# Patient Record
Sex: Female | Born: 1955 | Race: White | Hispanic: No | Smoking: Former smoker
Health system: Southern US, Community
[De-identification: ages and names within clinical notes are randomized; demographics above are authoritative.]

## PROBLEM LIST (undated history)

## (undated) DIAGNOSIS — F329 Major depressive disorder, single episode, unspecified: Secondary | ICD-10-CM

## (undated) DIAGNOSIS — J449 Chronic obstructive pulmonary disease, unspecified: Secondary | ICD-10-CM

## (undated) DIAGNOSIS — F32A Depression, unspecified: Secondary | ICD-10-CM

## (undated) DIAGNOSIS — D649 Anemia, unspecified: Secondary | ICD-10-CM

## (undated) DIAGNOSIS — I739 Peripheral vascular disease, unspecified: Secondary | ICD-10-CM

## (undated) DIAGNOSIS — E785 Hyperlipidemia, unspecified: Secondary | ICD-10-CM

## (undated) DIAGNOSIS — I214 Non-ST elevation (NSTEMI) myocardial infarction: Secondary | ICD-10-CM

## (undated) DIAGNOSIS — I10 Essential (primary) hypertension: Secondary | ICD-10-CM

## (undated) DIAGNOSIS — E669 Obesity, unspecified: Secondary | ICD-10-CM

## (undated) DIAGNOSIS — I2510 Atherosclerotic heart disease of native coronary artery without angina pectoris: Secondary | ICD-10-CM

## (undated) DIAGNOSIS — M1990 Unspecified osteoarthritis, unspecified site: Secondary | ICD-10-CM

## (undated) DIAGNOSIS — L97309 Non-pressure chronic ulcer of unspecified ankle with unspecified severity: Secondary | ICD-10-CM

## (undated) HISTORY — PX: CHOLECYSTECTOMY: SHX55

## (undated) HISTORY — DX: Obesity, unspecified: E66.9

## (undated) HISTORY — DX: Essential (primary) hypertension: I10

## (undated) HISTORY — DX: Hyperlipidemia, unspecified: E78.5

## (undated) HISTORY — PX: BYPASS GRAFT POPLITEAL TO TIBIAL: SHX5764

## (undated) HISTORY — PX: CORONARY ANGIOPLASTY WITH STENT PLACEMENT: SHX49

## (undated) HISTORY — DX: Peripheral vascular disease, unspecified: I73.9

## (undated) HISTORY — PX: FRACTURE SURGERY: SHX138

## (undated) HISTORY — PX: ANKLE SURGERY: SHX546

## (undated) HISTORY — PX: ABDOMINAL AORTA STENT: SHX1108

## (undated) HISTORY — DX: Chronic obstructive pulmonary disease, unspecified: J44.9

---

## 1997-09-07 ENCOUNTER — Other Ambulatory Visit: Admission: RE | Admit: 1997-09-07 | Discharge: 1997-09-07 | Payer: Self-pay | Admitting: Obstetrics and Gynecology

## 1998-03-07 ENCOUNTER — Other Ambulatory Visit: Admission: RE | Admit: 1998-03-07 | Discharge: 1998-03-07 | Payer: Self-pay | Admitting: Obstetrics & Gynecology

## 1998-07-11 ENCOUNTER — Other Ambulatory Visit: Admission: RE | Admit: 1998-07-11 | Discharge: 1998-07-11 | Payer: Self-pay | Admitting: Obstetrics & Gynecology

## 1999-02-07 ENCOUNTER — Inpatient Hospital Stay (HOSPITAL_COMMUNITY): Admission: AD | Admit: 1999-02-07 | Discharge: 1999-02-08 | Payer: Self-pay | Admitting: *Deleted

## 1999-02-07 ENCOUNTER — Encounter: Payer: Self-pay | Admitting: *Deleted

## 1999-05-29 ENCOUNTER — Other Ambulatory Visit: Admission: RE | Admit: 1999-05-29 | Discharge: 1999-05-29 | Payer: Self-pay | Admitting: Obstetrics and Gynecology

## 2000-09-04 ENCOUNTER — Ambulatory Visit (HOSPITAL_COMMUNITY): Admission: RE | Admit: 2000-09-04 | Discharge: 2000-09-04 | Payer: Self-pay | Admitting: Cardiology

## 2001-06-02 ENCOUNTER — Other Ambulatory Visit: Admission: RE | Admit: 2001-06-02 | Discharge: 2001-06-02 | Payer: Self-pay | Admitting: Obstetrics and Gynecology

## 2001-06-09 ENCOUNTER — Ambulatory Visit (HOSPITAL_COMMUNITY): Admission: RE | Admit: 2001-06-09 | Discharge: 2001-06-09 | Payer: Self-pay | Admitting: Obstetrics and Gynecology

## 2001-06-09 ENCOUNTER — Encounter: Payer: Self-pay | Admitting: Obstetrics and Gynecology

## 2002-06-01 ENCOUNTER — Emergency Department (HOSPITAL_COMMUNITY): Admission: EM | Admit: 2002-06-01 | Discharge: 2002-06-01 | Payer: Self-pay | Admitting: Emergency Medicine

## 2002-06-21 ENCOUNTER — Encounter: Payer: Self-pay | Admitting: Obstetrics and Gynecology

## 2002-06-21 ENCOUNTER — Ambulatory Visit (HOSPITAL_COMMUNITY): Admission: RE | Admit: 2002-06-21 | Discharge: 2002-06-21 | Payer: Self-pay | Admitting: Obstetrics and Gynecology

## 2003-04-27 ENCOUNTER — Ambulatory Visit (HOSPITAL_COMMUNITY): Admission: RE | Admit: 2003-04-27 | Discharge: 2003-04-27 | Payer: Self-pay | Admitting: Cardiology

## 2008-03-24 ENCOUNTER — Ambulatory Visit: Payer: Self-pay | Admitting: Vascular Surgery

## 2008-04-04 ENCOUNTER — Ambulatory Visit: Payer: Self-pay | Admitting: Vascular Surgery

## 2008-04-24 ENCOUNTER — Ambulatory Visit (HOSPITAL_COMMUNITY): Admission: RE | Admit: 2008-04-24 | Discharge: 2008-04-24 | Payer: Self-pay | Admitting: Vascular Surgery

## 2008-08-15 ENCOUNTER — Ambulatory Visit: Payer: Self-pay | Admitting: Vascular Surgery

## 2009-02-07 ENCOUNTER — Ambulatory Visit (HOSPITAL_COMMUNITY): Admission: RE | Admit: 2009-02-07 | Discharge: 2009-02-07 | Payer: Self-pay | Admitting: Family Medicine

## 2010-11-05 NOTE — Assessment & Plan Note (Signed)
OFFICE VISIT   FRANKLIN, CLAPSADDLE C  DOB:  [birth year]                                       08/15/2008  ZOXWR#:[id number]   I saw the patient in the office today for continued followup of her  venous stasis ulcer and peripheral vascular disease.  I had originally  seen her in consultation in October of 2009 with a wound over her left  medial malleolus.  She underwent an arteriogram to see if she might be a  candidate for endovascular intervention.  However, she was found to have  some mild proximal superficial femoral artery occlusive disease with  occlusion of the above knee popliteal artery and distal superficial  femoral artery.  She was not a candidate for an endovascular approach as  the disease extended down to the level of the knee joint.  She is felt  to be at higher risk for bypass surgery given her morbid obesity and  multiple comorbidities.  She comes in to have her wound check.  She  states that the wound has really not changed significantly in size.  She  has been doing dressing changes with some mild compression.  She has had  no fever or chills.   REVIEW OF SYSTEMS:  On review of systems she has had no chest pain,  chest pressure, palpitations or arrhythmias.  She has had no bronchitis,  asthma or wheezing.   SOCIAL HISTORY:  Unfortunately she continues to smoke a pack per day of  cigarettes.   PHYSICAL EXAMINATION:  General:  This is a pleasant 55 year old woman  who appears her stated age.  Lungs:  Are clear bilaterally to  auscultation.  Cardiac:  She has a regular rate and rhythm.  I cannot  palpate her femoral pulses because of her size.  I cannot palpate  popliteal pulses on either side.  She has monophasic Doppler signals in  the left foot with triphasic Doppler signals on the right.   Doppler study in our office today shows an ABI of 100% on the right and  63% on the left which is stable.  The ulcer on the medial malleolus of  the  left ankle measures 2.5 cm in length x 1 cm in height.   I have again reviewed her arteriogram.  Her best option for  revascularization would be a femoral to below knee popliteal artery  bypass graft ideally with the vein if possible.  I think she would be at  very high risk for wound complications given her obesity and diabetes.  I think the risk of wound complications would be 30%.  Based on her  arteriogram she does have the majority of her disease is limited to the  short segment distal superficial femoral artery and above knee popliteal  artery occlusion with extensive collaterals around this area.  She has  three vessel runoff below this.  I had again a long discussion with her  about the importance of tobacco cessation and I think that this wound  may heal with continued aggressive wound care if she were able to get  off cigarettes.  This may take some time but given the risks of surgery  I think this will be the best approach.  If the wound progresses then I  think the only option is attempted fem-pop bypass graft despite the  increased risk.  Of note, if she does undergo surgery we will have her  see Dr. Edwyna Shell preoperatively as she has had previous PTCA by Dr. Edwyna Shell  in the late '90s.  I plan on seeing her back in 3 months and at that  time will also map her greater saphenous vein on the left as this would  also help [place] to determine if she were a reasonable candidate for fem-pop  bypass grafting.  She knows to call sooner if she has problems.   Di Kindle. Edilia Bo, M.D.  Electronically Signed   CSD/MEDQ  D:  08/15/2008  T:  08/16/2008  Job:  [id number]   cc:   Jonelle Sidle, MD  Theresia Majors. Tanda Rockers, M.D.

## 2010-11-05 NOTE — Consult Note (Signed)
VASCULAR SURGERY CONSULTATION   [name], [name]  DOB:  Feb 15, 1956                                       04/04/2008  ZOXWR#:[id number]   I saw the patient in the office today in consultation concerning a  nonhealing wound of her left medial malleolus.  This is a pleasant 55-  year-old woman who was referred by Dr. Tanda Rockers.  She was [place]  [place] at which time she sustained a left ankle  fracture and required placement of pins for repair of the fracture.  This was done in [place].  She had been doing well until 4-5 weeks  ago when she developed a small wound in her medial malleolus which has  not shown any significant evidence of healing and has gradually  progressed.  She was evaluated by Dr. Tanda Rockers and workup included ABIs  which suggested some underlying peripheral vascular disease and she was  sent for vascular consultation.  Of note, she also had a left foot x-ray  and it was noted in this report that there was some focal lucency over  the medial malleolus on the frontal projection.  Osteomyelitis could not  be excluded, however, it was difficult to tell as there were significant  degenerative changes at the tibiotalar joint and also the presence of  hardware.   The patient denies any history of significant claudication and has had  no history of rest pain or previous nonhealing ulcers.   PAST MEDICAL HISTORY:  1. Her past medical history is significant for morbid obesity.  2. Non-insulin-dependent diabetes which she has had for approximately      5 years.  3. Hypertension.  4. Hypercholesterolemia.  5. History of coronary artery disease.  She underwent a PTCA by Dr.      Juanda Chance in the late 1990s.  6. She denies any previous history of myocardial infarction, history      of congestive heart failure or history of COPD.   PAST SURGICAL HISTORY:  Significant for previous C-section in 1988.   FAMILY HISTORY:  Her mother had  lung cancer.  Her father had a  myocardial infarction at age 44.  She had two brothers who had  myocardial infarctions in their late 62s.  She had a sister who had a  myocardial infarction in her late 67s.   SOCIAL HISTORY:  She is married.  She has one child.  She smokes one and  a half packs per day of cigarettes and has been smoking for 35 years.   REVIEW OF SYSTEMS AND MEDICATIONS:  Are documented on the medical  history form in her chart.   PHYSICAL EXAMINATION:  General:  This is a pleasant 55 year old woman  who appears her stated age.  She is morbidly obese.  Vital signs:  Blood  pressure is 161/101, heart rate is 96.  Neck:  Neck is supple.  There is  no cervical lymphadenopathy.  Lungs:  Lungs are clear bilaterally to  auscultation.  Cardiac:  She has a regular rate and rhythm.  Abdomen:  Her abdomen is obese and difficult to assess.  She has normal pitched  bowel sounds.  She has difficult to palpate femoral pulses because of  her weight.  However, I do faintly feel femoral pulses.  I cannot  palpate popliteal pedal pulses on  either side.  On my exam she has  biphasic dorsalis pedis and posterior tibial signal on the right.  She  has a monophasic anterior tibial signal and posterior tibial signal on  the left.  She has a superficial ulceration over the left medial  malleolus.  She has hyperpigmentation consistent with chronic venous  insufficiency.  She has some mild bilateral lower extremity swelling.   Doppler study done at [facility] showed an ABI 80% on the right and 61% on  the left.   I have explained that given this wound on her left medial malleolus in  addition to evidence of infrainguinal arterial occlusive disease and her  diabetes this is clearly a limb threatening situation.  I cannot palpate  any exposed hardware within the wound or exposed bone.  However, I think  she is at high risk for limb loss and I have recommended we proceed with  arteriography to see  what options she might have for revascularization.  Certainly the procedure is associated with increased risk given her  morbid obesity.  I have discussed the indications for arteriography and  the potential complications including but not limited to bleeding,  arterial injury and renal insufficiency.  If we see stenosis amenable to  angioplasty this could potentially be addressed at the same time.  I  have discussed the potential complications of angioplasty including but  not limited to arterial injury, arterial thrombosis and bleeding.  All  of her questions were answered.  She is agreeable to proceed.  We have  also had a discussion about the importance of tobacco cessation and  clearly this is contributing to her nonhealing wound and progression of  her disease.  We will make further recommendations pending results of  her arteriogram.   Di Kindle. Edilia Bo, M.D.  Electronically Signed  CSD/MEDQ  D:  04/04/2008  T:  04/05/2008  Job:  [id number]   cc:   Jake Shark A. Tanda Rockers, M.D.  Jonelle Sidle, MD

## 2010-11-05 NOTE — Op Note (Signed)
[name], [name]                ACCOUNT NO.:  [id number]   MEDICAL RECORD NO.:  [id number]          PATIENT TYPE:  AMB   LOCATION:  SDS                          FACILITY:  [facility]   PHYSICIAN:  Di Kindle. Edilia Bo, M.D.DATE OF BIRTH:  Dec 02, 1955   DATE OF PROCEDURE:  04/24/2008  DATE OF DISCHARGE:  04/24/2008                               OPERATIVE REPORT   PREOPERATIVE DIAGNOSIS:  Arterial peripheral vascular disease with  nonhealing wound of left leg.   POSTOPERATIVE DIAGNOSIS:  Arterial peripheral vascular disease with  nonhealing wound of left leg.   PROCEDURES:  1. Attempted right femoral cannulation.  2. Attempted left femoral cannulation under ultrasound guidance.  3. Ultrasound-guided left brachial catheterization.  4. Selective catheterization of infrarenal aorta, right common iliac      artery, left common iliac artery, and bilateral lower extremity      runoff.   TECHNIQUE:  The patient was taken to the PV lab and received 1 mg of  Versed and 50 mcg of fentanyl.  Both groins were prepped and draped in  usual sterile fashion.  This patient was morbidly obese and on  interrogation with the ultrasound, the patient had a high bifurcation of  a brachial artery with the femoral head way above her inguinal crease  and a large pannus.  Using ultrasound guidance, I did attempt to  cannulate the right superficial femoral artery and was able to access  the artery, but unable to thread the wire given her significant obesity  and the fact that her bifurcation was high, did not persist on the  right.  I then tried on the left side, again had the same problem.  Under ultrasound guidance, I was able to cannulate the superficial  femoral artery, which was very deep and small.  I was unable to thread  the wire.  Again, rather than persist, I elected to do a brachial  approach.  The patient was reprepped and draped.  The left brachial  artery had a good pulse and under ultrasound  guidance after the skin was  anesthetized and after the arm had been prepped and draped in the usual  sterile fashion, the artery was cannulated, and a 5-French sheath  introduced over wire.  The patient received 200 mcg of the nitroglycerin  and 2000 mg of heparin through the sheath.  The pigtail catheter was  then used and advanced over the wire and used to direct the wire down  the descending thoracic aorta.  The pigtail catheter was positioned at  the L2 vertebral body and flush aortogram obtained.  Catheter was then  advanced above the aortic bifurcation and oblique iliac projections were  obtained.  Next, the pigtail catheter was exchanged for an long right  catheter, which was directed into the left common iliac artery.  The  wire was advanced down into the external iliac artery and the catheter  advanced over the wire, selective left external iliac arteriogram  obtained.  Next, the catheter was withdrawn and positioned into the  right common iliac artery and selective right common iliac artery  injection  made with bilateral lower extremity runoff obtained via  selective cannulation of each common iliac artery.   FINDINGS:  There are single renal arteries bilaterally with no  significant renal artery stenosis identified.  The infrarenal aorta,  bilateral common iliac arteries, bilateral external iliac arteries, and  hypogastric arteries are widely patent.   On the left side, which is the problematic side with a nonhealing wound,  there is a high bifurcation of the common femoral artery.  The deep  femoral artery is patent.  There is some mild superficial femoral artery  occlusive disease.  The superficial femoral artery is then occluded at  the adductor canal with reconstitution of the popliteal artery above the  knee.  However, there is some moderate disease at the level of the knee.  The below-knee popliteal artery is patent with three-vessel runoff on  the left via the  anterior tibial, posterior tibial, and peroneal  arteries.   On the left side, the common femoral, superficial femoral, and deep  femoral arteries are all widely patent.  Popliteal artery has some mild  disease at the level of the knee.  The below-knee popliteal, anterior  tibial, posterior tibial, and peroneal arteries are all patent.   CONCLUSIONS:  Left superficial femoral artery occlusion with  reconstitution of the below-knee popliteal artery and three-vessel  runoff.  No significant aortoiliac occlusive disease.      Di Kindle. Edilia Bo, M.D.  Electronically Signed     CSD/MEDQ  D:  04/24/2008  T:  04/24/2008  Job:  [id number]   cc:   Jake Shark A. Tanda Rockers, M.D.

## 2010-11-08 NOTE — Cardiovascular Report (Signed)
[place]. [facility]  Patient:    [name], [name]                       MRN: [id number] Proc. Date: 09/04/00 Adm. Date:  [id number] Attending:  Lenoria Farrier CC:         Artis Delay, M.D.  Thomas C. Wall, M.D. [facility]  Cardiopulmonary Laboratory   Cardiac Catheterization  PROCEDURES PERFORMED:  Cardiac catheterization.  CLINICAL HISTORY:  [name] is 55 years old and 18 months ago had stenting of the right coronary artery and IVUS of the circumflex artery as part of the REVERSAL trial.  She has done quite well since that time and has been on REVERSAL study drug over the 18 months.  She returns now for followup angiography as part of the protocol.  DESCRIPTION OF PROCEDURE:  The procedure was performed via the right femoral artery using an arterial sheath and 6 French preformed coronary catheters.  We had a great deal of difficulty accessing the right femoral artery and had to do this with the help of a Doppler needle.  We used a 7 [place] guiding catheter with side holes for the injection of the left coronary artery.  At the completion of the diagnostic study, the patient was given weight-adjusted heparin to prolong the ACT greater than 200 seconds.  After giving intracoronary nitroglycerin, we passed a short floppy wire down the circumflex artery into the second marginal branch.  Using a 3.2 Ultra-Cross ultrasound catheter we advanced the catheter into the second marginal branch.  An IVUS run was then done with automatic pullback.  Repeat diagnostic studies were then performed through the guiding catheter.  The patient tolerated the procedure well and left the laboratory in satisfactory condition.  RESULTS:  The left main coronary artery:  The left main coronary artery was free of significant disease.  Left anterior descending:  The left anterior descending artery gave rise to two diagonal branches and a septal perforator.  There was 40%  narrowing after the second diagonal branch.  Circumflex artery:  The circumflex artery gave rise to two marginal branches and a posterolateral branch.  There was 30% narrowing in the proximal circumflex artery.  There was 30% narrowing near the ostium of the second marginal branch.  Right coronary artery:  The right coronary is a dominant vessel that gave rise to a right ventricular branch, a posterior descending branch and two posterolateral branches.  There was 20% narrowing within the stent in the proximal right coronary artery.  The rest of the vessel appeared to be free of significant disease.  LEFT VENTRICULOGRAPHY:  The left ventriculogram performed in the RAO projection showed good wall motion with no areas of hypokinesis.  The estimated ejection fraction was 60%.  The aortic pressure was 156/77 with a mean of 111.  Left ventricular pressure was 156/18.  CONCLUSIONS: 1. Coronary artery disease, status post prior stenting of the proximal right    coronary artery 18 months ago with less than 20% narrowing at the stent    site in the proximal right coronary artery, 40% narrowing in the proximal    left anterior descending, 30% narrowing in the proximal circumflex artery    with 30% narrowing in the second marginal branch of the circumflex artery    and normal left ventricular function. 2. Intravascular ultrasound study is part of the REVERSAL trial.  RECOMMENDATIONS:  [name] does not appear to have any significant  progression of disease and has no significant re-stenosis at the stent site. We will plan continued medical therapy, continued secondary risk prevention.  ADDENDUM:  We attempted to Perclose the right femoral artery at the end of the procedure, but as we were tightening the suture the suture broke and we were not able to obtain closure.  For this reason, we had to hold the right coronary artery by the fact her ACT was 220. DD:  09/04/00 TD:  09/04/00 Job:  [id number]

## 2011-03-25 LAB — POCT I-STAT, CHEM 8
BUN: 9
Calcium, Ion: 1.18
Chloride: 105
Creatinine, Ser: 0.6
Glucose, Bld: 120 — ABNORMAL HIGH
HCT: 49 — ABNORMAL HIGH
Hemoglobin: 16.7 — ABNORMAL HIGH
Potassium: 4.1
Sodium: 142
TCO2: 28

## 2011-03-25 LAB — GLUCOSE, CAPILLARY: Glucose-Capillary: 132 — ABNORMAL HIGH

## 2011-07-10 ENCOUNTER — Emergency Department (HOSPITAL_COMMUNITY): Payer: No Typology Code available for payment source

## 2011-07-10 ENCOUNTER — Other Ambulatory Visit (HOSPITAL_COMMUNITY): Payer: Self-pay | Admitting: Internal Medicine

## 2011-07-10 ENCOUNTER — Ambulatory Visit (HOSPITAL_COMMUNITY)
Admission: RE | Admit: 2011-07-10 | Discharge: 2011-07-10 | Disposition: A | Discharge status: Home / Self Care | Payer: No Typology Code available for payment source | Source: Ambulatory Visit | Attending: Internal Medicine | Admitting: Internal Medicine

## 2011-07-10 ENCOUNTER — Encounter (HOSPITAL_COMMUNITY): Payer: Self-pay

## 2011-07-10 ENCOUNTER — Emergency Department (HOSPITAL_COMMUNITY)
Admission: EM | Admit: 2011-07-10 | Discharge: 2011-07-10 | Disposition: A | Discharge status: Home / Self Care | Payer: No Typology Code available for payment source | Attending: Emergency Medicine | Admitting: Emergency Medicine

## 2011-07-10 DIAGNOSIS — F17200 Nicotine dependence, unspecified, uncomplicated: Secondary | ICD-10-CM | POA: Insufficient documentation

## 2011-07-10 DIAGNOSIS — R1011 Right upper quadrant pain: Secondary | ICD-10-CM | POA: Insufficient documentation

## 2011-07-10 DIAGNOSIS — M25529 Pain in unspecified elbow: Secondary | ICD-10-CM

## 2011-07-10 DIAGNOSIS — I10 Essential (primary) hypertension: Secondary | ICD-10-CM | POA: Insufficient documentation

## 2011-07-10 DIAGNOSIS — M76899 Other specified enthesopathies of unspecified lower limb, excluding foot: Secondary | ICD-10-CM

## 2011-07-10 DIAGNOSIS — Y9229 Other specified public building as the place of occurrence of the external cause: Secondary | ICD-10-CM | POA: Insufficient documentation

## 2011-07-10 DIAGNOSIS — R000 Tachycardia, unspecified: Secondary | ICD-10-CM | POA: Insufficient documentation

## 2011-07-10 DIAGNOSIS — S8000XA Contusion of unspecified knee, initial encounter: Secondary | ICD-10-CM

## 2011-07-10 DIAGNOSIS — E119 Type 2 diabetes mellitus without complications: Secondary | ICD-10-CM | POA: Insufficient documentation

## 2011-07-10 DIAGNOSIS — IMO0002 Reserved for concepts with insufficient information to code with codable children: Secondary | ICD-10-CM | POA: Insufficient documentation

## 2011-07-10 DIAGNOSIS — W19XXXA Unspecified fall, initial encounter: Secondary | ICD-10-CM | POA: Insufficient documentation

## 2011-07-10 DIAGNOSIS — S42209A Unspecified fracture of upper end of unspecified humerus, initial encounter for closed fracture: Secondary | ICD-10-CM | POA: Insufficient documentation

## 2011-07-10 DIAGNOSIS — R079 Chest pain, unspecified: Secondary | ICD-10-CM | POA: Insufficient documentation

## 2011-07-10 DIAGNOSIS — M25569 Pain in unspecified knee: Secondary | ICD-10-CM | POA: Insufficient documentation

## 2011-07-10 DIAGNOSIS — M25519 Pain in unspecified shoulder: Secondary | ICD-10-CM | POA: Insufficient documentation

## 2011-07-10 DIAGNOSIS — M25469 Effusion, unspecified knee: Secondary | ICD-10-CM | POA: Insufficient documentation

## 2011-07-10 DIAGNOSIS — Z7982 Long term (current) use of aspirin: Secondary | ICD-10-CM | POA: Insufficient documentation

## 2011-07-10 LAB — POCT I-STAT, CHEM 8
BUN: 16 mg/dL (ref 6–23)
Calcium, Ion: 1.15 mmol/L (ref 1.12–1.32)
Chloride: 99 mEq/L (ref 96–112)
Creatinine, Ser: 0.6 mg/dL (ref 0.50–1.10)
Glucose, Bld: 363 mg/dL — ABNORMAL HIGH (ref 70–99)
HCT: 52 % — ABNORMAL HIGH (ref 36.0–46.0)
Hemoglobin: 17.7 g/dL — ABNORMAL HIGH (ref 12.0–15.0)
Potassium: 4.5 mEq/L (ref 3.5–5.1)
Sodium: 136 mEq/L (ref 135–145)
TCO2: 26 mmol/L (ref 0–100)

## 2011-07-10 MED ORDER — IOHEXOL 300 MG/ML  SOLN
100.0000 mL | Freq: Once | INTRAMUSCULAR | Status: AC | PRN
  Administered 2011-07-10: 100 mL via INTRAVENOUS

## 2011-07-10 MED ORDER — OXYCODONE-ACETAMINOPHEN 5-325 MG PO TABS
2.0000 | ORAL_TABLET | Freq: Once | ORAL | Status: AC
  Administered 2011-07-10: 2 via ORAL
  Filled 2011-07-10: qty 2

## 2011-07-10 MED ORDER — OXYCODONE-ACETAMINOPHEN 5-325 MG PO TABS: 2.0000 | ORAL_TABLET | ORAL | Status: AC | PRN

## 2011-07-10 NOTE — ED Notes (Signed)
Patient transported to CT 

## 2011-07-10 NOTE — ED Notes (Signed)
Pt fell at [facility] and has a fractured left shoulder and pain to left knee. Pt denies LOC.

## 2011-07-10 NOTE — ED Provider Notes (Signed)
History   Scribed for Glynn Octave, MD, the patient was seen in [facility]. The chart was scribed by Gilman Schmidt. The patients care was started at 8:32 PM.   CSN: [id number]  Arrival date & time 07/10/11  1755   First MD Initiated Contact with Patient 07/10/11 1829      Chief Complaint  Patient presents with  . Fall  . Shoulder Pain  . Knee Pain    (Consider location/radiation/quality/duration/timing/severity/associated sxs/prior treatment) HPI [name] is a 56 y.o. female with a history of DM and HTN who presents to the Emergency Department complaining of left shoulder pain and left knee pain from fall. Pt reports falling at [facility] and having XR performed theres. Reports that XR showed shoulder fracture. Pt also notes pain on right side of ribs. Pt denies any elbow pain, wrist pain, chest pain, abdominal pain, or syncope. States she did not hit head. Pt takes ASA daily. There are no other associated symptoms and no other alleviating or aggravating factors. There are no other associated symptoms and no other alleviating or aggravating factors.   Past Medical History  Diagnosis Date  . Diabetes mellitus     Past Surgical History  Procedure Date  . Coronary angioplasty with stent placement   . Cholecystectomy     History reviewed. No pertinent family history.  History  Substance Use Topics  . Smoking status: Current Everyday Smoker -- 2.0 packs/day  . Smokeless tobacco: Not on file  . Alcohol Use: No    OB History    Grav Para Term Preterm Abortions TAB SAB Ect Mult Living                  Review of Systems  Cardiovascular: Negative for chest pain.  Gastrointestinal: Negative for abdominal pain.  Musculoskeletal:       Shoulder pain Knee Pain  Neurological: Negative for syncope and headaches.  All other systems reviewed and are negative.    Allergies  Penicillins  Home Medications   Current Outpatient Rx  Name Route Sig Dispense Refill    . ALPRAZOLAM 1 MG PO TABS Oral Take 1 mg by mouth daily as needed. For nerves    . ASPIRIN EC 325 MG PO TBEC Oral Take 325 mg by mouth daily.    Marland Kitchen GLIMEPIRIDE 2 MG PO TABS Oral Take 2 mg by mouth 2 (two) times daily.    Marland Kitchen HYDROCHLOROTHIAZIDE 12.5 MG PO CAPS Oral Take 12.5 mg by mouth daily.    Marland Kitchen METFORMIN HCL 500 MG PO TABS Oral Take 500 mg by mouth 2 (two) times daily with a meal.    . OXYCODONE HCL 15 MG PO TABS Oral Take 15 mg by mouth every 4 (four) hours as needed. For pain    . QUINAPRIL HCL 20 MG PO TABS Oral Take 20 mg by mouth daily.      BP 144/76  Pulse 115  Temp(Src) 98.1 F (36.7 C) (Oral)  Resp 20  Ht 5\' 2"  (1.575 m)  Wt 253 lb (114.76 kg)  BMI 46.27 kg/m2  SpO2 95%  Physical Exam  Constitutional: She is oriented to person, place, and time. She appears well-developed and well-nourished.  Non-toxic appearance. She does not have a sickly appearance.  HENT:  Head: Normocephalic and atraumatic.  Eyes: Conjunctivae, EOM and lids are normal. Pupils are equal, round, and reactive to light. No scleral icterus.  Neck: Trachea normal and normal range of motion. Neck supple.  Cardiovascular: Regular rhythm  and normal heart sounds.   Pulmonary/Chest: Effort normal and breath sounds normal.  Abdominal: Soft. Normal appearance. There is no tenderness. There is no rebound, no guarding and no CVA tenderness.  Musculoskeletal: Normal range of motion.       Diffuse ecchymosis over left patellar  Tender to lateral proximal UE (no bruising) Left knee large joint effusion with ecchymosis and small abrasion No ligament laxity Right rib tenderness to palpation   Neurological: She is alert and oriented to person, place, and time. She has normal strength.  Skin: Skin is warm, dry and intact. No rash noted.    ED Course  Procedures (including critical care time)  Labs Reviewed - No data to display Dg Chest 2 View  07/10/2011  *RADIOLOGY REPORT*  Clinical Data: Fall, anterior rib pain   CHEST - 2 VIEW  Comparison: 02/07/2009  Findings: Lungs are clear. No pleural effusion or pneumothorax.  Cardiomediastinal silhouette is within normal limits.  Degenerative changes of the visualized thoracolumbar spine.  IMPRESSION: No evidence of acute cardiopulmonary disease.  Original Report Authenticated By: Charline Bills, M.D.   Dg Shoulder Left  07/10/2011  *RADIOLOGY REPORT*  Clinical Data: Shoulder pain with decreased range of motion post fall  LEFT SHOULDER - 2+ VIEW  Comparison: None  Findings: Suboptimal visualization secondary to body habitus. AC joint alignment normal. Bones appear demineralized. Displaced greater tuberosity fracture. Probable additional fracture plane through the surgical neck of the left humerus though this is suboptimally visualized. No dislocation. Visualized left ribs intact.  IMPRESSION: Proximal left humeral fracture as above.  Original Report Authenticated By: Lollie Marrow, M.D.   Dg Knee Complete 4 Views Left  07/10/2011  *RADIOLOGY REPORT*  Clinical Data: Decreased range of motion and pain left knee post fall  LEFT KNEE - COMPLETE 4+ VIEW  Comparison: None  Findings: Osseous demineralization. Diffuse joint space narrowing. Marginal spur formation. No definite fracture, dislocation or bone destruction. No knee joint effusion. Minimal atherosclerotic calcification.  IMPRESSION: Osseous demineralization with mild degenerative changes left knee. No definite acute bony abnormalities.  Original Report Authenticated By: Lollie Marrow, M.D.     No diagnosis found.  DIAGNOSTIC STUDIES: Oxygen Saturation is 98% on room air, normal by my interpretation.    Radiology: DG Chest 2 View. Reviewed by me. IMPRESSION: No evidence of acute cardiopulmonary disease. Original Report Authenticated By: Charline Bills, M.D.  COORDINATION OF CARE: 6:33pm:  - Patient evaluated by ED physician, Percocet and DG Chest ordered      MDM  Trip and mechanical fall at [facility]  medical office. Did not hit head or lose consciousness. Complaining of pain in left shoulder, left knee and right ribs. No anterior chest pain, no abdominal pain. Already had x-rays performed at the office which showed a proximal humerus fracture on the left  Patient refusing x-ray of her chest stating that her ribs are just sore.  She has equal breath sounds no bruising or crepitance on her chest wall.  Discussed with Dr. Hilda Lias.  Place patient in a immobilizer, follow up on Tuesday morning at 8:00, sleep semi-erect.  Patient now agreeable to chest x-ray. No acute abnormalities or rib fractures seen.  Patient with persistent tachycardia, right lower rib pain and right upper quadrant pain. She is anxious to go home I explained my concern for the possibility of occult intra-abdominal injury. After extensive discussions she reluctantly agrees to imaging of her abdomen and checking of her hemoglobin.  CT negative for acute traumatic pathology.  I personally performed the services described in this documentation, which was scribed in my presence.  The recorded information has been reviewed and considered.        Glynn Octave, MD 07/10/11 2322

## 2011-07-10 NOTE — ED Notes (Signed)
Pt states wants to go home, "already know it's broke, just fix it and send me home".  Pt cannot move left arm without increased pain.

## 2012-04-07 ENCOUNTER — Other Ambulatory Visit: Payer: Self-pay

## 2012-04-07 DIAGNOSIS — L97909 Non-pressure chronic ulcer of unspecified part of unspecified lower leg with unspecified severity: Secondary | ICD-10-CM

## 2012-04-07 DIAGNOSIS — I739 Peripheral vascular disease, unspecified: Secondary | ICD-10-CM

## 2012-05-04 ENCOUNTER — Encounter: Payer: Self-pay | Admitting: Vascular Surgery

## 2012-05-05 ENCOUNTER — Encounter (INDEPENDENT_AMBULATORY_CARE_PROVIDER_SITE_OTHER): Payer: BC Managed Care – PPO | Admitting: *Deleted

## 2012-05-05 ENCOUNTER — Ambulatory Visit (INDEPENDENT_AMBULATORY_CARE_PROVIDER_SITE_OTHER): Payer: BC Managed Care – PPO | Admitting: *Deleted

## 2012-05-05 ENCOUNTER — Encounter: Payer: BC Managed Care – PPO | Admitting: *Deleted

## 2012-05-05 ENCOUNTER — Encounter: Payer: Self-pay | Admitting: Vascular Surgery

## 2012-05-05 ENCOUNTER — Other Ambulatory Visit: Payer: Self-pay | Admitting: *Deleted

## 2012-05-05 ENCOUNTER — Ambulatory Visit (INDEPENDENT_AMBULATORY_CARE_PROVIDER_SITE_OTHER): Payer: BC Managed Care – PPO | Admitting: Vascular Surgery

## 2012-05-05 VITALS — BP 147/61 | HR 81 | Temp 98.3°F | Ht 62.0 in | Wt 234.0 lb

## 2012-05-05 DIAGNOSIS — M7989 Other specified soft tissue disorders: Secondary | ICD-10-CM

## 2012-05-05 DIAGNOSIS — I83893 Varicose veins of bilateral lower extremities with other complications: Secondary | ICD-10-CM

## 2012-05-05 DIAGNOSIS — L97929 Non-pressure chronic ulcer of unspecified part of left lower leg with unspecified severity: Secondary | ICD-10-CM

## 2012-05-05 DIAGNOSIS — L98499 Non-pressure chronic ulcer of skin of other sites with unspecified severity: Secondary | ICD-10-CM

## 2012-05-05 DIAGNOSIS — L97909 Non-pressure chronic ulcer of unspecified part of unspecified lower leg with unspecified severity: Secondary | ICD-10-CM

## 2012-05-05 DIAGNOSIS — I739 Peripheral vascular disease, unspecified: Secondary | ICD-10-CM

## 2012-05-05 DIAGNOSIS — I7025 Atherosclerosis of native arteries of other extremities with ulceration: Secondary | ICD-10-CM | POA: Insufficient documentation

## 2012-05-05 NOTE — Progress Notes (Signed)
Vascular and Vein Specialist of [facility]  Patient name: [name] MRN: [id number] DOB: 1955-10-07 Sex: female  REASON FOR CONSULT: nonhealing venous stasis ulcer in the left leg. Referred by Dr. Mills Koller.  HPI: [name] is a 56 y.o. female who developed a wound on the medial aspect of her left leg in July related to a scratch. This has failed to heal and she is now being followed in the wound care clinic. She has made some progress however wound healing has been very slow and she was sent for vascular consultation. Activity is fairly limited because of her weight. I do not get any history of rest pain. She has some claudication which is brought on by ambulation and relieved with rest.  She denies fever or chills. She has been having an Unna boot placed on her wound on the left leg.  Past Medical History  Diagnosis Date  . Diabetes mellitus   . Peripheral vascular disease   . Hyperlipidemia   . Obesity   . Hypertension     Family History  Problem Relation Age of Onset  . Cancer Mother   . Heart disease Father   . Hyperlipidemia Father   . Hypertension Father   . Other Father     varicose veins  . Heart attack Father   . Heart disease Sister   . Hyperlipidemia Sister   . Hypertension Sister   . Other Sister     varicose veins  . Heart attack Sister   . Heart disease Brother   . Hypertension Brother   . Hyperlipidemia Brother   . Other Brother     varicose veins  . Heart attack Brother     SOCIAL HISTORY: History  Substance Use Topics  . Smoking status: Current Every Day Smoker -- 1.5 packs/day for 30 years    Types: Cigarettes  . Smokeless tobacco: Never Used  . Alcohol Use: No    Allergies  Allergen Reactions  . Penicillins Hives    Current Outpatient Prescriptions  Medication Sig Dispense Refill  . alprazolam (XANAX) 2 MG tablet Take 1 mg by mouth 3 (three) times daily. Takes 1/2 of 2mg  tablet      . aspirin EC 325 MG tablet Take 325 mg by  mouth daily.      Marland Kitchen escitalopram (LEXAPRO) 10 MG tablet Take 10 mg by mouth daily.      Marland Kitchen glimepiride (AMARYL) 2 MG tablet Take 2 mg by mouth 2 (two) times daily.      . hydrochlorothiazide (MICROZIDE) 12.5 MG capsule Take 12.5 mg by mouth daily.      . metFORMIN (GLUCOPHAGE) 500 MG tablet Take 500 mg by mouth 2 (two) times daily with a meal.      . NEXIUM 40 MG capsule Take 40 mg by mouth daily.      . Oxycodone HCl 20 MG TABS Take 20 mg by mouth as needed.      . quinapril (ACCUPRIL) 20 MG tablet Take 20 mg by mouth daily.      Marland Kitchen oxyCODONE (ROXICODONE) 15 MG immediate release tablet Take 15 mg by mouth every 4 (four) hours as needed. For pain        REVIEW OF SYSTEMS: Arly.Keller ] denotes positive finding; [  ] denotes negative finding  CARDIOVASCULAR:  [ ]  chest pain   [ ]  chest pressure   [ ]  palpitations   [ ]  orthopnea   [ ]  dyspnea on exertion   [ ]   claudication   [ ]  rest pain   [ ]  DVT   [ ]  phlebitis PULMONARY:   [ ]  productive cough   [ ]  asthma   [ ]  wheezing NEUROLOGIC:   [ ]  weakness  [ ]  paresthesias  [ ]  aphasia  [ ]  amaurosis  [ ]  dizziness HEMATOLOGIC:   [ ]  bleeding problems   [ ]  clotting disorders MUSCULOSKELETAL:  [ ]  joint pain   [ ]  joint swelling Arly.Keller ] leg swelling GASTROINTESTINAL: [ ]   blood in stool  [ ]   hematemesis GENITOURINARY:  [ ]   dysuria  [ ]   hematuria PSYCHIATRIC:  [ ]  history of major depression INTEGUMENTARY:  [ ]  rashes  Arly.Keller ] ulcers CONSTITUTIONAL:  [ ]  fever   [ ]  chills  PHYSICAL EXAM: Filed Vitals:   05/05/12 1008  BP: 147/61  Pulse: 81  Temp: 98.3 F (36.8 C)  TempSrc: Oral  Height: 5\' 2"  (1.575 m)  Weight: 234 lb (106.142 kg)  SpO2: 99%   Body mass index is 42.80 kg/(m^2). GENERAL: The patient is a well-nourished female, in no acute distress. The vital signs are documented above. CARDIOVASCULAR: There is a regular rate and rhythm. I do not detect carotid bruits. I was able to palpate a right femoral pulse although diminished. I cannot palpate  a left femoral pulse. Of note is difficult to palpate her pulses because of her pannus. I cannot palpate popliteal or pedal pulses. PULMONARY: There is good air exchange bilaterally without wheezing or rales. ABDOMEN: Soft and non-tender with normal pitched bowel sounds.  MUSCULOSKELETAL: There are no major deformities or cyanosis. NEUROLOGIC: No focal weakness or paresthesias are detected. SKIN: she has 2 open ulcers on the medial aspect of her left leg. The superior wound measures 1.5 cm x 0.5 cm. The inferior wound measures approximately 0.35 cm in maximum diameter. She has hyperpigmentation of both lower extremities consistent with chronic venous insufficiency. PSYCHIATRIC: The patient has a normal affect.  DATA:  I reviewed her arteriogram which was done via a brachial approach in 2010. This showed a very high bifurcation of the common femoral artery. She had moderate disease of her proximal superficial femoral artery with occlusion of the distal superficial femoral artery and some disease of the popliteal artery. At that time revascularization would've required essentially retroperitoneal exposure for a left common femoral artery to below knee popliteal artery bypass.  I have independently interpreted her arterial Doppler study today which shows an ABI of  51% on the right and 30% on the left. Toe pressure on the left was only 20 mm of mercury.  I have independently interpreted her venous duplex scan which shows no evidence of DVT involving the left femoral and popliteal veins. Visualization was somewhat limited because of her wound. There was reflux noted in the left femoral popliteal and greater saphenous veins.  MEDICAL ISSUES: This patient has combined arterial insufficiency and a venous stasis ulcer. She has evidence of multilevel arterial disease with a toe pressure of only 20 mm of mercury an ABI of 30%. His is clearly a limb threatening situation. I recommended we proceed with a CT  angiogram to evaluate her for possible revascularization. When she returns after her arteriogram we will obtain vein mapping. If she does require an infrarenal bypass I would like to use vein at all possible. She would be at very high risk for a postoperative wound infection given her obesity and if at all possible we'll would be best  to not use a prosthetic graft. She also appears to have evidence of inflow disease which significantly complicates things. She is clearly not a candidate for aortofemoral bypass grafting. She has a diminished right femoral pulse and therefore fem-fem bypass would not be a good option either. Certainly axillofemoral bypass grafting if she required an inflow procedure would be associated with significant risk of infection given her obesity. We will make further recommendations pending the results of her CT angiogram and vein mapping.   Tashena Ibach S [facility] of [facility]: [phone]

## 2012-05-18 NOTE — Addendum Note (Signed)
Addended by: Melodye Ped C on: 05/18/2012 12:14 PM   Modules accepted: Orders

## 2012-05-19 ENCOUNTER — Other Ambulatory Visit: Payer: Self-pay | Admitting: Vascular Surgery

## 2012-05-25 ENCOUNTER — Encounter: Payer: Self-pay | Admitting: Vascular Surgery

## 2012-05-26 ENCOUNTER — Encounter (INDEPENDENT_AMBULATORY_CARE_PROVIDER_SITE_OTHER): Payer: BC Managed Care – PPO | Admitting: *Deleted

## 2012-05-26 ENCOUNTER — Ambulatory Visit
Admission: RE | Admit: 2012-05-26 | Discharge: 2012-05-26 | Disposition: A | Discharge status: Home / Self Care | Payer: BC Managed Care – PPO | Source: Ambulatory Visit | Attending: Vascular Surgery | Admitting: Vascular Surgery

## 2012-05-26 ENCOUNTER — Ambulatory Visit (INDEPENDENT_AMBULATORY_CARE_PROVIDER_SITE_OTHER): Payer: BC Managed Care – PPO | Admitting: Vascular Surgery

## 2012-05-26 ENCOUNTER — Encounter: Payer: Self-pay | Admitting: Vascular Surgery

## 2012-05-26 VITALS — BP 143/50 | HR 99 | Resp 16 | Ht 62.0 in | Wt 233.0 lb

## 2012-05-26 DIAGNOSIS — I739 Peripheral vascular disease, unspecified: Secondary | ICD-10-CM

## 2012-05-26 DIAGNOSIS — Z01810 Encounter for preprocedural cardiovascular examination: Secondary | ICD-10-CM

## 2012-05-26 DIAGNOSIS — L97909 Non-pressure chronic ulcer of unspecified part of unspecified lower leg with unspecified severity: Secondary | ICD-10-CM

## 2012-05-26 DIAGNOSIS — L98499 Non-pressure chronic ulcer of skin of other sites with unspecified severity: Secondary | ICD-10-CM

## 2012-05-26 MED ORDER — IOHEXOL 350 MG/ML SOLN
150.0000 mL | Freq: Once | INTRAVENOUS | Status: AC | PRN
  Administered 2012-05-26: 150 mL via INTRAVENOUS

## 2012-05-26 NOTE — Progress Notes (Signed)
Vascular and Vein Specialist of [facility]  Patient name: [name] MRN: [id number] DOB: 05/22/1956 Sex: female  REASON FOR VISIT: nonhealing venous stasis ulcer of the left leg.  HPI: [name] is a 56 y.o. female who I saw on 05/05/2012 with a nonhealing venous stasis ulcer the left leg. She had evidence of multilevel arterial occlusive disease. Back in 2009 9 attempted femoral cannulation but ultimately had to do a brachial catheterization. She was noted to have a high bifurcation of the common femoral artery on the left. There was a left superficial femoral artery occlusion with reconstitution of the below knee popliteal artery and three-vessel runoff. Given the difficulties before in order to assess for any progression of disease I scheduled a CT angiogram to further evaluate for multilevel arterial occlusive disease. He comes in today after having had this test to discuss these results.  The wound has shown little progress and she continues with daily dressing changes. Unfortunately she also continues to smoke.   REVIEW OF SYSTEMS: Arly.Keller ] denotes positive finding; [  ] denotes negative finding  CARDIOVASCULAR:  [ ]  chest pain   [ ]  dyspnea on exertion    CONSTITUTIONAL:  [ ]  fever   [ ]  chills  PHYSICAL EXAM: Filed Vitals:   05/26/12 1516  BP: 143/50  Pulse: 99  Resp: 16  Height: 5\' 2"  (1.575 m)  Weight: 233 lb (105.688 kg)  SpO2: 100%   Body mass index is 42.62 kg/(m^2). GENERAL: The patient is a well-nourished female, in no acute distress. The vital signs are documented above. CARDIOVASCULAR: There is a regular rate and rhythm  PULMONARY: There is good air exchange bilaterally without wheezing or rales. She has diminished but palpable femoral pulses bilaterally. I cannot palpate pedal pulses. The wound on her left leg has not changed. There is moderate drainage.  I have reviewed her CT angiogram. Although the radiologist interpreted this as showing no significant  aortoiliac occlusive disease, by my interpretation she has significant disease within the infrarenal aorta and also bilateral common iliac arteries. He does appear that I could potentially cannulate the left common femoral artery at the level of the femoral head in order to further evaluate her iliac disease for possible angioplasty.  Saphenous vein diameters range from 0.71 to 0.36 cm in the mid calf.  MEDICAL ISSUES: This patient has a nonhealing venous stasis ulcer of the left leg with multilevel arterial occlusive disease. This is clearly a limb threatening problem. Situation is complicated by her morbid obesity. Her BMI is 42.6. He also has significant diabetes and continues to smoke. I am reluctant to consider a femoropopliteal bypass graft given that she may have significant inflow disease. Therefore I think we should attempt arteriography. Based on her CT it looks like it could potentially cannulate the left common femoral artery at the level of the femoral head. If she has significant iliac disease amenable to angioplasty this could potentially be addressed. I think we can also further evaluate her infrainguinal arterial occlusive disease. Pending these results she can be considered for left femoral to below knee popliteal artery bypass grafting. Her vein map to show that she has adequate saphenous vein on the left although there some varicosities originating off of the vein.I do think we need to use vein if at all possible given her morbid obesity and diabetes that she will obviously be at extremely high risk for wound healing problems in the groin.  [name] S [facility]ists  of [employer]: [phone]

## 2012-06-09 ENCOUNTER — Other Ambulatory Visit: Payer: Self-pay

## 2012-06-11 ENCOUNTER — Encounter (HOSPITAL_COMMUNITY): Payer: Self-pay | Admitting: Pharmacy Technician

## 2012-06-28 ENCOUNTER — Telehealth: Payer: Self-pay | Admitting: Vascular Surgery

## 2012-06-28 ENCOUNTER — Ambulatory Visit (HOSPITAL_COMMUNITY)
Admission: RE | Admit: 2012-06-28 | Discharge: 2012-06-28 | Disposition: A | Discharge status: Home / Self Care | Payer: BC Managed Care – PPO | Source: Ambulatory Visit | Attending: Vascular Surgery | Admitting: Vascular Surgery

## 2012-06-28 ENCOUNTER — Encounter (HOSPITAL_COMMUNITY)
Admission: RE | Disposition: A | Discharge status: Home / Self Care | Payer: Self-pay | Source: Ambulatory Visit | Attending: Vascular Surgery

## 2012-06-28 DIAGNOSIS — L98499 Non-pressure chronic ulcer of skin of other sites with unspecified severity: Secondary | ICD-10-CM

## 2012-06-28 DIAGNOSIS — I872 Venous insufficiency (chronic) (peripheral): Secondary | ICD-10-CM | POA: Insufficient documentation

## 2012-06-28 DIAGNOSIS — E119 Type 2 diabetes mellitus without complications: Secondary | ICD-10-CM | POA: Insufficient documentation

## 2012-06-28 DIAGNOSIS — I739 Peripheral vascular disease, unspecified: Secondary | ICD-10-CM

## 2012-06-28 DIAGNOSIS — L97909 Non-pressure chronic ulcer of unspecified part of unspecified lower leg with unspecified severity: Secondary | ICD-10-CM | POA: Insufficient documentation

## 2012-06-28 HISTORY — PX: ABDOMINAL AORTAGRAM: SHX5454

## 2012-06-28 LAB — POCT I-STAT, CHEM 8
BUN: 11 mg/dL (ref 6–23)
Creatinine, Ser: 0.6 mg/dL (ref 0.50–1.10)
Potassium: 4.1 mEq/L (ref 3.5–5.1)
Sodium: 139 mEq/L (ref 135–145)

## 2012-06-28 SURGERY — ABDOMINAL AORTAGRAM
Anesthesia: LOCAL

## 2012-06-28 MED ORDER — FENTANYL CITRATE 0.05 MG/ML IJ SOLN
INTRAMUSCULAR | Status: AC
  Filled 2012-06-28: qty 2

## 2012-06-28 MED ORDER — ONDANSETRON HCL 4 MG/2ML IJ SOLN: 4.0000 mg | Freq: Four times a day (QID) | INTRAMUSCULAR | Status: DC | PRN

## 2012-06-28 MED ORDER — SODIUM CHLORIDE 0.9 % IV SOLN: 1.0000 mL/kg/h | INTRAVENOUS | Status: DC

## 2012-06-28 MED ORDER — LIDOCAINE HCL (PF) 1 % IJ SOLN
INTRAMUSCULAR | Status: AC
  Filled 2012-06-28: qty 30

## 2012-06-28 MED ORDER — SODIUM CHLORIDE 0.9 % IV SOLN
INTRAVENOUS | Status: DC
  Administered 2012-06-28: 06:00:00 via INTRAVENOUS

## 2012-06-28 MED ORDER — HEPARIN (PORCINE) IN NACL 2-0.9 UNIT/ML-% IJ SOLN
INTRAMUSCULAR | Status: AC
  Filled 2012-06-28: qty 1000

## 2012-06-28 MED ORDER — ACETAMINOPHEN 325 MG PO TABS: 650.0000 mg | ORAL_TABLET | ORAL | Status: DC | PRN

## 2012-06-28 MED ORDER — MIDAZOLAM HCL 2 MG/2ML IJ SOLN
INTRAMUSCULAR | Status: AC
  Filled 2012-06-28: qty 2

## 2012-06-28 NOTE — Op Note (Signed)
PATIENT: [name]   MRN: [id number] DOB: 10-23-1955    DATE OF PROCEDURE: 06/28/2012  INDICATIONS: [name] is a 56 y.o. female with a slowly healing venous stasis ulcer of her left leg with infrainguinal arterial occlusive disease. She is brought in for diagnostic arteriography.  PROCEDURE:  1. Ultrasound-guided access to the right common femoral artery 2. Aortogram with bilateral iliac arteriogram 3. Selective catheterization of the left external iliac artery with left lower extremity runoff 4. Retrograde right femoral arteriogram with right femoral runoff 5. Perclose of right common femoral artery  SURGEON: Di Kindle. Edilia Bo, MD, FACS  ANESTHESIA: local with sedation   EBL: minimal  TECHNIQUE: The patient was brought to the peripheral vascular lab and sedated with 1 mg of Versed and 50 mcg of fentanyl. The right groin was prepped and draped in usual sterile fashion. Under ultrasound guidance, and after the skin was anesthetized, the right common femoral artery was cannulated with a micro-puncture needle and a micro-puncture wire introduced. The micropuncture sheath was introduced and then a Benson wire placed in the micropuncture sheath exchanged for a 5 [place] sheath. Pigtail catheter was positioned at the L1 vertebral body. Flush aortogram was obtained. The catheter was then repositioned above the aortic bifurcation and an oblique iliac projection was obtained. The pigtail catheter was then exchanged for a crossover catheter which was positioned into the left common iliac artery. An angled Glidewire was advanced into the external iliac artery on the left and then the crossover catheter was exchanged for an endhole catheter. Selective left external iliac arteriogram was obtained with left lower extremity runoff. This catheter was then removed and a right femoral arteriogram obtained to the right femoral sheath. At the completion the artery was closed using the Perclose device with  good hemostasis noted.  FINDINGS:  1. There are single renal arteries bilaterally with no significant renal artery stenosis identified. 2. The infrarenal aorta is widely patent. 3. On the left side, the left common iliac artery, hypogastric artery, external iliac artery, and common femoral artery are patent. The left deep femoral artery is patent. There is severe diffuse disease of the proximal superficial femoral artery which is then occluded above the knee. There is reconstitution of the anterior tibial artery, peroneal artery, and posterior tibial arteries. 4. On the right side, then the proximal common femoral artery is patent. There is an eccentric plaque which is fairly bulky just at the distal right common iliac artery adjacent to the hypogastric artery. The hypogastric artery is patent. The external iliac artery is patent. The common femoral artery and deep femoral artery on the right are patent. There is moderate to severe diffuse disease throughout the superficial femoral artery and popliteal artery on the left. The below-knee popliteal artery is patent although small. The anterior tibial, posterior tibial, and peroneal arteries are patent on the right although somewhat small.  Waverly Ferrari, MD, FACS [facility]  DATE OF DICTATION:   06/28/2012

## 2012-06-28 NOTE — H&P (Signed)
Patient name: [name] MRN: [id number] DOB: 1956-03-19 Sex: female   REASON FOR VISIT: nonhealing venous stasis ulcer of the left leg.   HPI:  [name] is a 57 y.o. female who I saw on 05/05/2012 with a nonhealing venous stasis ulcer the left leg. She had evidence of multilevel arterial occlusive disease. Back in 2009 9 attempted femoral cannulation but ultimately had to do a brachial catheterization. She was noted to have a high bifurcation of the common femoral artery on the left. There was a left superficial femoral artery occlusion with reconstitution of the below knee popliteal artery and three-vessel runoff. Given the difficulties before in order to assess for any progression of disease I scheduled a CT angiogram to further evaluate for multilevel arterial occlusive disease. She comes in today after having had this test to discuss these results.   The wound has shown little progress and she continues with daily dressing changes. Unfortunately she also continues to smoke.   REVIEW OF SYSTEMS: Arly.Keller ] denotes positive finding; [ ]  denotes negative finding  CARDIOVASCULAR: [ ]  chest pain [ ]  dyspnea on exertion  CONSTITUTIONAL: [ ]  fever [ ]  chills   PHYSICAL EXAM:  Filed Vitals:    06/28/12  BP:  136/68   Pulse:  77  Resp:  16   Height:  5\' 2"  (1.575 m)   Weight:  233 lb (105.688 kg)   SpO2:  100%    Body mass index is 42.62 kg/(m^2).  GENERAL: The patient is a well-nourished female, in no acute distress. The vital signs are documented above.  CARDIOVASCULAR: There is a regular rate and rhythm  PULMONARY: There is good air exchange bilaterally without wheezing or rales.  She has diminished but palpable femoral pulses bilaterally. I cannot palpate pedal pulses.  The wound on her left leg has not changed. There is moderate drainage.   I have reviewed her CT angiogram. Although the radiologist interpreted this as showing no significant aortoiliac occlusive disease, by my  interpretation she has significant disease within the infrarenal aorta and also bilateral common iliac arteries. It does appear that I could potentially cannulate the left common femoral artery at the level of the femoral head in order to further evaluate her iliac disease for possible angioplasty.  Saphenous vein diameters range from 0.71 to 0.36 cm in the mid calf.   MEDICAL ISSUES:  This patient has a nonhealing venous stasis ulcer of the left leg with multilevel arterial occlusive disease. This is clearly a limb threatening problem. Situation is complicated by her morbid obesity. Her BMI is 42.6. He also has significant diabetes and continues to smoke. I am reluctant to consider a femoropopliteal bypass graft given that she may have significant inflow disease. Therefore I think we should attempt arteriography. Based on her CT it looks like it could potentially cannulate the left common femoral artery at the level of the femoral head. If she has significant iliac disease amenable to angioplasty this could potentially be addressed. I think we can also further evaluate her infrainguinal arterial occlusive disease. Pending these results she can be considered for left femoral to below knee popliteal artery bypass grafting. Her vein map to show that she has adequate saphenous vein on the left although there some varicosities originating off of the vein.I do think we need to use vein if at all possible given her morbid obesity and diabetes that she will obviously be at extremely high risk for wound healing problems in the  groin.   DICKSON,CHRISTOPHER S  [facility] [place]  Beeper: [phone]

## 2012-06-28 NOTE — Telephone Encounter (Addendum)
Message copied by Shari Prows on Mon Jun 28, 2012  1:34 PM ------      Message from: Melene Plan      Created: Mon Jun 28, 2012 11:29 AM      Regarding: FW: charge and f/u                   ----- Message -----         From: Chuck Hint, MD         Sent: 06/28/2012   8:49 AM           To: Reuel Derby, Melene Plan, RN      Subject: charge and f/u                                           PROCEDURE:       1. Ultrasound-guided access to the right common femoral artery      2. Aortogram with bilateral iliac arteriogram      3. Selective catheterization of the left external iliac artery with left lower extremity runoff      4. Retrograde right femoral arteriogram with right femoral runoff      5. Perclose of right common femoral artery            SURGEON: Di Kindle. Edilia Bo, MD, FACS            She will need a follow up visit in 3-4 weeks to check on her left leg wound. Thanks. CSD  I scheduled an appt for the above pt on 07/21/12 at 8:45am. The pt is aware of the appt and I also mailed an appt letter.awt

## 2012-07-21 ENCOUNTER — Ambulatory Visit: Payer: BC Managed Care – PPO | Admitting: Vascular Surgery

## 2012-08-03 ENCOUNTER — Encounter: Payer: Self-pay | Admitting: Vascular Surgery

## 2012-08-04 ENCOUNTER — Other Ambulatory Visit: Payer: Self-pay | Admitting: *Deleted

## 2012-08-04 ENCOUNTER — Encounter: Payer: Self-pay | Admitting: Vascular Surgery

## 2012-08-04 ENCOUNTER — Ambulatory Visit (INDEPENDENT_AMBULATORY_CARE_PROVIDER_SITE_OTHER): Payer: BC Managed Care – PPO | Admitting: Vascular Surgery

## 2012-08-04 VITALS — BP 174/75 | HR 83 | Ht 62.0 in | Wt 235.0 lb

## 2012-08-04 DIAGNOSIS — I739 Peripheral vascular disease, unspecified: Secondary | ICD-10-CM

## 2012-08-04 DIAGNOSIS — L98499 Non-pressure chronic ulcer of skin of other sites with unspecified severity: Secondary | ICD-10-CM

## 2012-08-04 DIAGNOSIS — I878 Other specified disorders of veins: Secondary | ICD-10-CM

## 2012-08-04 NOTE — Progress Notes (Signed)
[facility]  Patient name: [name] MRN: [id number] DOB: 01-15-1956 Sex: female  REASON FOR VISIT: follow up of wounds on the left leg.  HPI: [name] is a 57 y.o. female who I have been following with a wound on her left leg. This is a venous stasis ulcer. She has been followed in the wound care clinic and they have been putting Silvadene on her wound and also a growth factor. She states that the wound has been improving gradually. She denies any fever or chills. Did develop a small wound in her left second toe which is also being treated. Her activity is fairly limited because of her obesity. I do not get any clear-cut a history of rest pain.   REVIEW OF SYSTEMS: Arly.Keller ] denotes positive finding; [  ] denotes negative finding  CARDIOVASCULAR:  [ ]  chest pain   [ ]  dyspnea on exertion    CONSTITUTIONAL:  [ ]  fever   [ ]  chills  PHYSICAL EXAM: Filed Vitals:   08/04/12 0903  BP: 174/75  Pulse: 83  Height: 5\' 2"  (1.575 m)  Weight: 235 lb (106.595 kg)  SpO2: 100%   Body mass index is 42.97 kg/(m^2). GENERAL: The patient is a well-nourished female, in no acute distress. The vital signs are documented above. CARDIOVASCULAR: There is a regular rate and rhythm  PULMONARY: There is good air exchange bilaterally without wheezing or rales. I cannot palpate her femoral pulses. Of note she does have a high bifurcation of her common femoral artery and given her body habitus this may simply be the issue. His monophasic Doppler signals in both feet. The venous stasis ulcer on the left leg measures 2 cm in length by 1 cm in width. There is a small wound below that which measures 1 cm in length by 0.4 cm in width. As a small wound on the second toe with mild erythema.  MEDICAL ISSUES: If the wounds failed to heal she would require a femoral tibial bypass. This would certainly be associated with significant risk given her obesity. She would be at high risk for  wound complications especially in the groin. In addition she has evidence of chronic venous insufficiency and will have wound healing problems in the lower part of her leg also. The options would be to bypass to the posterior tibial artery in the midcalf versus the anterior tibial artery on the left. I see her back in 4-5 weeks. If the wounds continue to improve we'll simply follow this. The wound or not improving and we would need to consider bypass. If her vein has not been mapped in the left leg we will obtain greater saphenous vein mapping on the left. In addition I will have the lab duplex the left common femoral artery sure that this is patent. Although there is no evidence of inflow disease on the left on her arteriogram, simply cannot feel a pulse on the left or right. His may be related to the high bifurcation of the common femoral artery. Again, this anatomy would make bypass especially challenging.  Malanie Koloski S [facility]  Beeper: [phone]

## 2012-08-09 ENCOUNTER — Encounter: Payer: Self-pay | Admitting: Vascular Surgery

## 2012-09-07 ENCOUNTER — Encounter: Payer: Self-pay | Admitting: Vascular Surgery

## 2012-09-08 ENCOUNTER — Ambulatory Visit: Payer: BC Managed Care – PPO | Admitting: Vascular Surgery

## 2012-09-28 ENCOUNTER — Encounter: Payer: Self-pay | Admitting: Vascular Surgery

## 2012-09-29 ENCOUNTER — Encounter (INDEPENDENT_AMBULATORY_CARE_PROVIDER_SITE_OTHER): Payer: BC Managed Care – PPO | Admitting: Vascular Surgery

## 2012-09-29 ENCOUNTER — Ambulatory Visit (INDEPENDENT_AMBULATORY_CARE_PROVIDER_SITE_OTHER): Payer: BC Managed Care – PPO | Admitting: Vascular Surgery

## 2012-09-29 ENCOUNTER — Encounter: Payer: Self-pay | Admitting: Vascular Surgery

## 2012-09-29 VITALS — BP 142/54 | HR 77 | Ht 62.0 in | Wt 239.6 lb

## 2012-09-29 DIAGNOSIS — L97909 Non-pressure chronic ulcer of unspecified part of unspecified lower leg with unspecified severity: Secondary | ICD-10-CM

## 2012-09-29 DIAGNOSIS — Z01810 Encounter for preprocedural cardiovascular examination: Secondary | ICD-10-CM

## 2012-09-29 DIAGNOSIS — L98499 Non-pressure chronic ulcer of skin of other sites with unspecified severity: Secondary | ICD-10-CM

## 2012-09-29 DIAGNOSIS — L97929 Non-pressure chronic ulcer of unspecified part of left lower leg with unspecified severity: Secondary | ICD-10-CM | POA: Insufficient documentation

## 2012-09-29 DIAGNOSIS — I739 Peripheral vascular disease, unspecified: Secondary | ICD-10-CM

## 2012-09-29 DIAGNOSIS — I878 Other specified disorders of veins: Secondary | ICD-10-CM

## 2012-09-29 NOTE — Progress Notes (Signed)
[facility]  Patient name: GWENLYN [name] MRN: [id number] DOB: [birth year] Sex: female  REASON FOR VISIT: follow up of left leg wounds.  HPI: [name] is a 57 y.o. female who I have been following with left leg wounds related to chronic venous insufficiency. She has infrainguinal arterial occlusive disease. She is followed in the wound care center by Dr. Mills Koller. She comes in for a wound check. The wound center has made excellent progress with the left leg which are significantly smaller. She denies significant pain in the left leg. She did have one bleeding episode in the right leg were she bumped her leg and injured a small varicose vein. She has had no further problems with this. She denies any history of rest pain.  She has undergone a previous arteriogram which shows on the left side, there is no significant disease of the left common iliac artery or external iliac artery. There is severe diffuse disease of the proximal superficial femoral artery which is occluded above the knee. There is reconstitution of the anterior tibial peroneal and posterior tibial arteries.   REVIEW OF SYSTEMS: Arly.Keller ] denotes positive finding; [  ] denotes negative finding  CARDIOVASCULAR:  [ ]  chest pain   [ ]  dyspnea on exertion    CONSTITUTIONAL:  [ ]  fever   [ ]  chills  PHYSICAL EXAM: Filed Vitals:   09/29/12 0953  BP: 142/54  Pulse: 77  Height: 5\' 2"  (1.575 m)  Weight: 239 lb 9.6 oz (108.682 kg)  SpO2: 96%   Body mass index is 43.81 kg/(m^2). GENERAL: The patient is a well-nourished female, in no acute distress. The vital signs are documented above. CARDIOVASCULAR: There is a regular rate and rhythm  PULMONARY: There is good air exchange bilaterally without wheezing or rales. Is difficult to palpate her femoral pulses because of her morbid obesity. The wound on her left leg are significantly smaller. There is no erythema or drainage.  MEDICAL ISSUES: The wound  her left leg were improving and therefore were trying to avoid revascularization. Unfortunately she would require a femoral tibial bypass which would be associated with significant risk given her obesity. Plan on seeing her back in 6 months. She knows to call sooner she has problems. In addition we have had a long discussion again about the importance of tobacco cessation. She understands that nicotine can cause vasospasm for 12 hours so even 2 cigarettes a day are significantly affecting healing.  DICKSON,CHRISTOPHER S [facility] [place] Beeper: [phone]

## 2012-09-30 NOTE — Addendum Note (Signed)
Addended by: Adria Dill L on: 09/30/2012 10:02 AM   Modules accepted: Orders

## 2013-03-22 ENCOUNTER — Other Ambulatory Visit (HOSPITAL_COMMUNITY): Payer: Self-pay | Admitting: Radiology

## 2013-03-22 ENCOUNTER — Other Ambulatory Visit: Payer: Self-pay | Admitting: Radiology

## 2013-04-06 ENCOUNTER — Ambulatory Visit: Payer: BC Managed Care – PPO | Admitting: Vascular Surgery

## 2013-04-12 ENCOUNTER — Encounter: Payer: Self-pay | Admitting: Vascular Surgery

## 2013-04-13 ENCOUNTER — Encounter (INDEPENDENT_AMBULATORY_CARE_PROVIDER_SITE_OTHER): Payer: Self-pay

## 2013-04-13 ENCOUNTER — Ambulatory Visit (INDEPENDENT_AMBULATORY_CARE_PROVIDER_SITE_OTHER): Payer: BC Managed Care – PPO | Admitting: Vascular Surgery

## 2013-04-13 ENCOUNTER — Ambulatory Visit (HOSPITAL_COMMUNITY)
Admission: RE | Admit: 2013-04-13 | Discharge: 2013-04-13 | Disposition: A | Discharge status: Home / Self Care | Payer: BC Managed Care – PPO | Source: Ambulatory Visit | Attending: Vascular Surgery | Admitting: Vascular Surgery

## 2013-04-13 ENCOUNTER — Encounter: Payer: Self-pay | Admitting: Vascular Surgery

## 2013-04-13 VITALS — BP 151/77 | HR 98 | Ht 62.0 in | Wt 229.0 lb

## 2013-04-13 DIAGNOSIS — L97809 Non-pressure chronic ulcer of other part of unspecified lower leg with unspecified severity: Secondary | ICD-10-CM | POA: Insufficient documentation

## 2013-04-13 DIAGNOSIS — I739 Peripheral vascular disease, unspecified: Secondary | ICD-10-CM | POA: Insufficient documentation

## 2013-04-13 DIAGNOSIS — L98499 Non-pressure chronic ulcer of skin of other sites with unspecified severity: Secondary | ICD-10-CM | POA: Insufficient documentation

## 2013-04-13 DIAGNOSIS — L97909 Non-pressure chronic ulcer of unspecified part of unspecified lower leg with unspecified severity: Secondary | ICD-10-CM

## 2013-04-13 DIAGNOSIS — L97929 Non-pressure chronic ulcer of unspecified part of left lower leg with unspecified severity: Secondary | ICD-10-CM

## 2013-04-13 NOTE — Addendum Note (Signed)
Addended by: Sharee Pimple on: 04/13/2013 03:49 PM   Modules accepted: Orders

## 2013-04-13 NOTE — Progress Notes (Signed)
[facility]  Patient name: [name] MRN: [id number] DOB: 01-21-1956 Sex: female  REASON FOR VISIT: follow up of left leg wounds.  HPI: [name] is a 57 y.o. female who I last saw on 09/29/2012. She has wounds on her left leg related to chronic venous insufficiency. She also has infrainguinal arterial occlusive disease. She has been followed at the wound care center. She has undergone a previous arteriogram which shows it on the left side there is no significant inflow disease. There is severe diffuse disease of the proximal superficial femoral artery which is occluded above the knee. There is reconstitution of the anterior tibial peroneal and posterior tibial arteries. Her only option for revascularization would be a femoral to tibial bypass which would be associated with increased risk of wound healing problems or infection given her obesity. We have also on multiple occasions discussed the importance of tobacco cessation. He comes in for a 6 month follow up visit.  She is followed in the wound care center and the wounds in the left leg healed  Saw her last period she has developed a small wound on the right medial malleolus and on her right second toe but these have also improved. Her activity is limited because of her obesity however she's has minimal claudication symptoms. She denies rest pain. She has cut back on smoking but unfortunately he continues to smoke.   Past Medical History  Diagnosis Date  . Diabetes mellitus   . Peripheral vascular disease   . Hyperlipidemia   . Obesity   . Hypertension    Family History  Problem Relation Age of Onset  . Cancer Mother   . Heart disease Father   . Hyperlipidemia Father   . Hypertension Father   . Other Father     varicose veins  . Heart attack Father   . Heart disease Sister   . Hyperlipidemia Sister   . Hypertension Sister   . Other Sister     varicose veins  . Heart attack Sister   . Heart  disease Brother   . Hypertension Brother   . Hyperlipidemia Brother   . Other Brother     varicose veins  . Heart attack Brother    SOCIAL HISTORY: History  Substance Use Topics  . Smoking status: Current Every Day Smoker -- 1.00 packs/day for 30 years    Types: Cigarettes  . Smokeless tobacco: Never Used  . Alcohol Use: No   Allergies  Allergen Reactions  . Adhesive [Tape]   . Penicillins Hives   Current Outpatient Prescriptions  Medication Sig Dispense Refill  . alprazolam (XANAX) 2 MG tablet Take 1 mg by mouth 3 (three) times daily.       Marland Kitchen aspirin EC 325 MG tablet Take 325 mg by mouth daily.      Marland Kitchen atorvastatin (LIPITOR) 20 MG tablet Take 20 mg by mouth daily.      . diclofenac sodium (VOLTAREN) 1 % GEL Apply 2 g topically 2 (two) times daily as needed. For right knee pain      . escitalopram (LEXAPRO) 10 MG tablet Take 10 mg by mouth daily.      Marland Kitchen glimepiride (AMARYL) 2 MG tablet Take 2 mg by mouth 2 (two) times daily.      . hydrochlorothiazide (MICROZIDE) 12.5 MG capsule Take 12.5 mg by mouth daily.      . meloxicam (MOBIC) 15 MG tablet Take 15 mg by mouth daily.      Marland Kitchen  metFORMIN (GLUCOPHAGE) 500 MG tablet Take 500 mg by mouth 2 (two) times daily with a meal.      . NEXIUM 40 MG capsule Take 40 mg by mouth daily.      . Oxycodone HCl 20 MG TABS Take 20 mg by mouth every 4 (four) hours as needed. For pain      . quinapril (ACCUPRIL) 20 MG tablet Take 20 mg by mouth daily.       No current facility-administered medications for this visit.   REVIEW OF SYSTEMS: Arly.Keller ] denotes positive finding; [  ] denotes negative finding  CARDIOVASCULAR:  [ ]  chest pain   [ ]  chest pressure   [ ]  palpitations   [ ]  orthopnea   [ ]  dyspnea on exertion   Arly.Keller ] claudication   [ ]  rest pain   [ ]  DVT   [ ]  phlebitis PULMONARY:   [ ]  productive cough   [ ]  asthma   [ ]  wheezing NEUROLOGIC:   [ ]  weakness  [ ]  paresthesias  [ ]  aphasia  [ ]  amaurosis  [ ]  dizziness HEMATOLOGIC:   [ ]  bleeding  problems   [ ]  clotting disorders MUSCULOSKELETAL:  [ ]  joint pain   [ ]  joint swelling Arly.Keller ] leg swelling GASTROINTESTINAL: [ ]   blood in stool  [ ]   hematemesis GENITOURINARY:  [ ]   dysuria  [ ]   hematuria PSYCHIATRIC:  [ ]  history of major depression INTEGUMENTARY:  [ ]  rashes  [ ]  ulcers CONSTITUTIONAL:  [ ]  fever   [ ]  chills  PHYSICAL EXAM: Filed Vitals:   04/13/13 1431  BP: 151/77  Pulse: 98  Height: 5\' 2"  (1.575 m)  Weight: 229 lb (103.874 kg)  SpO2: 94%   Body mass index is 41.87 kg/(m^2). GENERAL: The patient is a well-nourished female, in no acute distress. The vital signs are documented above. CARDIOVASCULAR: There is a regular rate and rhythm. I did not detect carotid bruits. Is difficult to palpate her femoral pulses because of her obesity. Cannot palpate pedal pulses. PULMONARY: There is good air exchange bilaterally without wheezing or rales. ABDOMEN: Soft and non-tender with normal pitched bowel sounds. She has a very large pannus. MUSCULOSKELETAL: There are no major deformities or cyanosis. NEUROLOGIC: No focal weakness or paresthesias are detected. SKIN: The wounds on her left leg had healed. He has a dry wound adjacent to her medial malleolus on the right which measures 1.0 cm-1.5 cm. There is a superficial wound on her right second toe. PSYCHIATRIC: The patient has a normal affect.  DATA:  I have independently interpreted her arterial Doppler study today which shows monophasic Doppler signals in the right dorsalis pedis and posterior tibial positions bilaterally. ABI on the right is 33% on the left 37%. Toe pressure on the right is 47 mm mercury and toe pressure on the left is 31 mm of mercury.  I did review her arteriogram from January of 2014 on the right side it was noted that her proximal common femoral artery was patent. There was an eccentric plaque at the distal right common iliac artery adjacent to the hypogastric artery. Hypogastric artery was patent. The  external iliac artery was patent. The common femoral and deep femoral artery on the right were patent. There was moderate diffuse disease throughout the superficial femoral artery and popliteal artery on the right. The below-knee pop artery and the right was patent although small. She had three-vessel runoff on the right although  the tibial vessels were small.   MEDICAL ISSUES: This patient has known bilateral infrainguinal arterial occlusive disease. On the left side she would require a femoral tibial bypass if she developed a limb threatening situation. She would be at high risk for surgery because of her obesity and very large pannus. We have again discussed the importance of tobacco cessation. She is followed in the wound care center and they're making progress with the wound on her right second toe and right medial malleolus. I'll plan on seeing her back in 6 months. I've ordered followed ABIs for that time. She knows to call sooner if she has problems.  DICKSON,CHRISTOPHER S [facility] [place] Beeper: [phone]

## 2013-07-19 ENCOUNTER — Encounter: Payer: Self-pay | Admitting: Vascular Surgery

## 2013-07-20 ENCOUNTER — Ambulatory Visit (INDEPENDENT_AMBULATORY_CARE_PROVIDER_SITE_OTHER): Payer: BC Managed Care – PPO | Admitting: Vascular Surgery

## 2013-07-20 ENCOUNTER — Ambulatory Visit (HOSPITAL_COMMUNITY)
Admission: RE | Admit: 2013-07-20 | Discharge: 2013-07-20 | Disposition: A | Discharge status: Home / Self Care | Payer: BC Managed Care – PPO | Source: Ambulatory Visit | Attending: Vascular Surgery | Admitting: Vascular Surgery

## 2013-07-20 ENCOUNTER — Encounter: Payer: Self-pay | Admitting: Vascular Surgery

## 2013-07-20 VITALS — BP 159/71 | HR 108 | Temp 98.7°F | Ht 62.0 in | Wt 235.7 lb

## 2013-07-20 DIAGNOSIS — L98499 Non-pressure chronic ulcer of skin of other sites with unspecified severity: Principal | ICD-10-CM

## 2013-07-20 DIAGNOSIS — Z01810 Encounter for preprocedural cardiovascular examination: Secondary | ICD-10-CM

## 2013-07-20 DIAGNOSIS — I739 Peripheral vascular disease, unspecified: Secondary | ICD-10-CM

## 2013-07-20 DIAGNOSIS — L97309 Non-pressure chronic ulcer of unspecified ankle with unspecified severity: Secondary | ICD-10-CM | POA: Insufficient documentation

## 2013-07-20 DIAGNOSIS — M25579 Pain in unspecified ankle and joints of unspecified foot: Secondary | ICD-10-CM | POA: Insufficient documentation

## 2013-07-20 NOTE — Progress Notes (Signed)
[facility]  Patient name: [name] MRN: [id number] DOB: 08-20-1955 Sex: female  REASON FOR VISIT: follow up of peripheral vascular disease.  HPI: [name] is a 58 y.o. female who I last saw on 04/13/2013. At that time she had wounds on her left leg related to chronic venous insufficiency. She also had infrainguinal arterial occlusive disease. She is followed at the wound care center. Previous arteriogram had demonstrated that on the left side there was no significant inflow disease. She had severe diffuse disease of the proximal superficial femoral artery which was occluded above the knee. There was reconstitution of the anterior tibial and posterior tibial arteries. Her only option for revascularization would be a femoral-tibial bypass which would have been associated with significant risk of wound healing problems. We also discussed the importance of tobacco cessation. She comes in for follow up visit.  Since I saw her last, she has developed an extensive wound on her right medial malleolus. She is treated at the wound care center. This wound has been debrided and essentially extends down to the bone. She is diabetic. She does continue to smoke. She does have some rest pain bilaterally. Her activity is fairly limited.   Past Medical History  Diagnosis Date  . Diabetes mellitus   . Peripheral vascular disease   . Hyperlipidemia   . Obesity   . Hypertension    Family History  Problem Relation Age of Onset  . Cancer Mother   . Heart disease Father   . Hyperlipidemia Father   . Hypertension Father   . Other Father     varicose veins  . Heart attack Father   . Heart disease Sister   . Hyperlipidemia Sister   . Hypertension Sister   . Other Sister     varicose veins  . Heart attack Sister   . Heart disease Brother   . Hypertension Brother   . Hyperlipidemia Brother   . Other Brother     varicose veins  . Heart attack Brother     SOCIAL HISTORY: History  Substance Use Topics  . Smoking status: Current Every Day Smoker -- 1.00 packs/day for 30 years    Types: Cigarettes  . Smokeless tobacco: Never Used     Comment: pt states that she is not quitting smoking and refused smoking cessation materials  . Alcohol Use: No   Allergies  Allergen Reactions  . Adhesive [Tape]   . Penicillins Hives   Current Outpatient Prescriptions  Medication Sig Dispense Refill  . alprazolam (XANAX) 2 MG tablet Take 1 mg by mouth 3 (three) times daily.       Marland Kitchen. aspirin EC 325 MG tablet Take 325 mg by mouth daily.      Marland Kitchen. atorvastatin (LIPITOR) 20 MG tablet Take 20 mg by mouth daily.      . diclofenac sodium (VOLTAREN) 1 % GEL Apply 2 g topically 2 (two) times daily as needed. For right knee pain      . escitalopram (LEXAPRO) 10 MG tablet Take 10 mg by mouth daily.      Marland Kitchen. gabapentin (NEURONTIN) 300 MG capsule Take 300 mg by mouth 3 (three) times daily.      Marland Kitchen. glimepiride (AMARYL) 2 MG tablet Take 2 mg by mouth 2 (two) times daily.      . hydrochlorothiazide (MICROZIDE) 12.5 MG capsule Take 12.5 mg by mouth daily.      . meloxicam (MOBIC) 15 MG tablet Take  15 mg by mouth daily.      . metFORMIN (GLUCOPHAGE) 500 MG tablet Take 500 mg by mouth 2 (two) times daily with a meal.      . NEXIUM 40 MG capsule Take 40 mg by mouth daily.      . Oxycodone HCl 20 MG TABS Take 20 mg by mouth every 4 (four) hours as needed. For pain      . quinapril (ACCUPRIL) 20 MG tablet Take 20 mg by mouth daily.       No current facility-administered medications for this visit.   REVIEW OF SYSTEMS: Arly.Keller ] denotes positive finding; [  ] denotes negative finding  CARDIOVASCULAR:  [ ]  chest pain   [ ]  chest pressure   [ ]  palpitations   [ ]  orthopnea   [ ]  dyspnea on exertion   Arly.Keller ] claudication   [X]  rest pain   [ ]  DVT   [ ]  phlebitis PULMONARY:   [ ]  productive cough   [ ]  asthma   [ ]  wheezing NEUROLOGIC:   Arly.Keller ] weakness  [ ]  paresthesias  [ ]  aphasia  [ ]   amaurosis  [ ]  dizziness HEMATOLOGIC:   [ ]  bleeding problems   [ ]  clotting disorders MUSCULOSKELETAL:  [ ]  joint pain   [ ]  joint swelling Arly.Keller ] leg swelling GASTROINTESTINAL: [ ]   blood in stool  [ ]   hematemesis GENITOURINARY:  [ ]   dysuria  [ ]   hematuria PSYCHIATRIC:  [ ]  history of major depression INTEGUMENTARY:  [ ]  rashes  [ ]  ulcers CONSTITUTIONAL:  [ ]  fever   [ ]  chills  PHYSICAL EXAM: Filed Vitals:   07/20/13 1415  BP: 159/71  Pulse: 108  Temp: 98.7 F (37.1 C)  TempSrc: Oral  Height: 5\' 2"  (1.575 m)  Weight: 235 lb 11.2 oz (106.913 kg)  SpO2: 96%   Body mass index is 43.1 kg/(m^2). GENERAL: The patient is a well-nourished female, in no acute distress. The vital signs are documented above. CARDIOVASCULAR: There is a regular rate and rhythm. I do not detect carotid bruits. Because of her obesity I cannot palpate femoral pulses. I cannot palpate pedal pulses. PULMONARY: There is good air exchange bilaterally without wheezing or rales. ABDOMEN: Soft and non-tender with normal pitched bowel sounds. She has an enormous pannus. MUSCULOSKELETAL: There are no major deformities or cyanosis. NEUROLOGIC: No focal weakness or paresthesias are detected. SKIN: she has an extensive wound over her right medial malleolus which measures 2 meters by 4 cm. This it appears to extend down to the bone.  She has hyperpigmentation bilaterally consistent with chronic venous insufficiency. PSYCHIATRIC: The patient has a normal affect.  DATA:  I have reviewed her arteriogram from 06/28/2012. On the right side, the proximal common femoral artery was patent. There was an eccentric plaque which was fairly bulky at the distal right common iliac artery adjacent to the hypogastric artery. The external iliac artery was patent. The common femoral and deep femoral arteries on the right were patent. There was moderate diffuse disease throughout the superficial femoral artery and popliteal artery. The  below-knee pop artery was patent although small. The anterior tibial, posterior tibial, and peroneal arteries were patent although small on the right.  I have independently interpreted her arterial Doppler study today which shows monophasic Doppler signals in the posterior tibial and anterior tibial positions bilaterally. ABI on the right is 31%. ABI on the left is 37%.  MEDICAL ISSUES:  Atherosclerosis  of native arteries of the extremities with ulceration(440.23) This patient has extensive wound over her right medial malleolus which extends to the bone. This is clearly a limb threatening situation. 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 I recommended that we proceed with a CT angiogram to further evaluate her multilevel arterial occlusive disease. Also arranged for a vein mapping on the right which she comes in after her CAT scan. The most aggressive approach would be to address her iliac disease if this is not progressed to complete occlusion and consider infrainguinal bypass. However even if these weren't successful she is at significant risk for limb loss given that the wound extends to the bone. She would require placement of a VAC. She would have significant risk associated with an aggressive approach given her morbid obesity, diabetes, and continued tobacco use. She may ultimately be better served by primary amputation. However, we will make further recommendations pending the results of her CT angiogram.   Maleka Contino S [facility] Beeper: [phone]

## 2013-07-20 NOTE — Assessment & Plan Note (Signed)
This patient has extensive wound over her right medial malleolus which extends to the bone. This is clearly a limb threatening situation. 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 I recommended that we proceed with a CT angiogram to further evaluate her multilevel arterial occlusive disease. Also arranged for a vein mapping on the right which she comes in after her CAT scan. The most aggressive approach would be to address her iliac disease if this is not progressed to complete occlusion and consider infrainguinal bypass. However even if these weren't successful she is at significant risk for limb loss given that the wound extends to the bone. She would require placement of a VAC. She would have significant risk associated with an aggressive approach given her morbid obesity, diabetes, and continued tobacco use. She may ultimately be better served by primary amputation. However, we will make further recommendations pending the results of her CT angiogram.

## 2013-07-22 ENCOUNTER — Other Ambulatory Visit: Payer: Self-pay | Admitting: Vascular Surgery

## 2013-07-23 LAB — CREATININE, SERUM: CREATININE: 0.44 mg/dL — AB (ref 0.50–1.10)

## 2013-07-23 LAB — BUN: BUN: 11 mg/dL (ref 6–23)

## 2013-07-25 ENCOUNTER — Ambulatory Visit
Admission: RE | Admit: 2013-07-25 | Discharge: 2013-07-25 | Disposition: A | Discharge status: Home / Self Care | Payer: BC Managed Care – PPO | Source: Ambulatory Visit | Attending: Vascular Surgery | Admitting: Vascular Surgery

## 2013-07-25 DIAGNOSIS — Z01810 Encounter for preprocedural cardiovascular examination: Secondary | ICD-10-CM

## 2013-07-25 DIAGNOSIS — I739 Peripheral vascular disease, unspecified: Secondary | ICD-10-CM

## 2013-07-25 DIAGNOSIS — L98499 Non-pressure chronic ulcer of skin of other sites with unspecified severity: Principal | ICD-10-CM

## 2013-07-25 MED ORDER — IOHEXOL 350 MG/ML SOLN
165.0000 mL | Freq: Once | INTRAVENOUS | Status: AC | PRN
  Administered 2013-07-25: 165 mL via INTRAVENOUS

## 2013-08-02 ENCOUNTER — Encounter: Payer: Self-pay | Admitting: Vascular Surgery

## 2013-08-03 ENCOUNTER — Encounter: Payer: Self-pay | Admitting: Vascular Surgery

## 2013-08-03 ENCOUNTER — Ambulatory Visit (HOSPITAL_COMMUNITY)
Admission: RE | Admit: 2013-08-03 | Discharge: 2013-08-03 | Disposition: A | Discharge status: Home / Self Care | Payer: BC Managed Care – PPO | Source: Ambulatory Visit | Attending: Vascular Surgery | Admitting: Vascular Surgery

## 2013-08-03 ENCOUNTER — Ambulatory Visit (INDEPENDENT_AMBULATORY_CARE_PROVIDER_SITE_OTHER): Payer: BC Managed Care – PPO | Admitting: Vascular Surgery

## 2013-08-03 VITALS — BP 157/71 | HR 95 | Ht 62.0 in | Wt 238.0 lb

## 2013-08-03 DIAGNOSIS — Z01810 Encounter for preprocedural cardiovascular examination: Secondary | ICD-10-CM

## 2013-08-03 DIAGNOSIS — L98499 Non-pressure chronic ulcer of skin of other sites with unspecified severity: Principal | ICD-10-CM

## 2013-08-03 DIAGNOSIS — Z01818 Encounter for other preprocedural examination: Secondary | ICD-10-CM | POA: Insufficient documentation

## 2013-08-03 DIAGNOSIS — I739 Peripheral vascular disease, unspecified: Secondary | ICD-10-CM | POA: Insufficient documentation

## 2013-08-03 NOTE — Progress Notes (Signed)
Vascular and Vein Specialist of [place]  Patient name: [name] MRN: [id number] DOB: [birth year] Sex: female  REASON FOR VISIT: nonhealing wound of the right medial malleolus with infrainguinal arterial occlusive disease.  HPI: [name] is a 58 y.o. female who I previously seen with a nonhealing left leg wound secondary to chronic venous insufficiency and infrainguinal arterial occlusive disease. She had undergone an arteriogram that showed on the left side she had diffuse disease of the proximal superficial femoral artery which was occluded above-the-knee. There was reconstitution of the anterior tibial and posterior tibial arteries. Ultimately the wounds on the left leg healed. When I saw her last on 07/20/2013, she had developed extensive wound on her right medial malleolus. This was been treated at the wound care center. It is difficult to say if the wound extends down to the bone. She also described rest pain bilaterally. I set her up for CT angiogram and vein mapping. She comes in today to discuss his results.  There have been no significant changes in her right leg wound.   Past Medical History  Diagnosis Date  . Diabetes mellitus   . Peripheral vascular disease   . Hyperlipidemia   . Obesity   . Hypertension    Family History  Problem Relation Age of Onset  . Cancer Mother   . Heart disease Father   . Hyperlipidemia Father   . Hypertension Father   . Other Father     varicose veins  . Heart attack Father   . Heart disease Sister   . Hyperlipidemia Sister   . Hypertension Sister   . Other Sister     varicose veins  . Heart attack Sister   . Heart disease Brother   . Hypertension Brother   . Hyperlipidemia Brother   . Other Brother     varicose veins  . Heart attack Brother    SOCIAL HISTORY: History  Substance Use Topics  . Smoking status: Current Every Day Smoker -- 1.00 packs/day for 30 years    Types: Cigarettes  . Smokeless tobacco: Never Used      Comment: pt states that she is not quitting smoking and refused smoking cessation materials  . Alcohol Use: No   Allergies  Allergen Reactions  . Adhesive [Tape]   . Penicillins Hives   Current Outpatient Prescriptions  Medication Sig Dispense Refill  . alprazolam (XANAX) 2 MG tablet Take 1 mg by mouth 3 (three) times daily.       Marland Kitchen. aspirin EC 325 MG tablet Take 325 mg by mouth daily.      Marland Kitchen. atorvastatin (LIPITOR) 20 MG tablet Take 20 mg by mouth daily.      . diclofenac sodium (VOLTAREN) 1 % GEL Apply 2 g topically 2 (two) times daily as needed. For right knee pain      . escitalopram (LEXAPRO) 10 MG tablet Take 10 mg by mouth daily.      Marland Kitchen. gabapentin (NEURONTIN) 300 MG capsule Take 300 mg by mouth 3 (three) times daily.      Marland Kitchen. glimepiride (AMARYL) 2 MG tablet Take 2 mg by mouth 2 (two) times daily.      . hydrochlorothiazide (MICROZIDE) 12.5 MG capsule Take 12.5 mg by mouth daily.      . meloxicam (MOBIC) 15 MG tablet Take 15 mg by mouth daily.      . metFORMIN (GLUCOPHAGE) 500 MG tablet Take 1,000 mg by mouth 2 (two) times daily.       .Marland Kitchen  NEXIUM 40 MG capsule Take 40 mg by mouth daily.      . Oxycodone HCl 20 MG TABS Take 20 mg by mouth every 4 (four) hours as needed. For pain      . quinapril (ACCUPRIL) 20 MG tablet Take 20 mg by mouth daily.       No current facility-administered medications for this visit.   REVIEW OF SYSTEMS: Arly.Keller ] denotes positive finding; [  ] denotes negative finding  CARDIOVASCULAR:  [ ]  chest pain   [ ]  chest pressure   [ ]  palpitations   [ ]  orthopnea   [ ]  dyspnea on exertion   [ ]  claudication   [ ]  rest pain   [ ]  DVT   [ ]  phlebitis PULMONARY:   [ ]  productive cough   [ ]  asthma   [ ]  wheezing NEUROLOGIC:   [ ]  weakness  [ ]  paresthesias  [ ]  aphasia  [ ]  amaurosis  [ ]  dizziness HEMATOLOGIC:   [ ]  bleeding problems   [ ]  clotting disorders MUSCULOSKELETAL:  [ ]  joint pain   [ ]  joint swelling [ ]  leg swelling GASTROINTESTINAL: [ ]   blood in stool   [ ]   hematemesis GENITOURINARY:  [ ]   dysuria  [ ]   hematuria PSYCHIATRIC:  [ ]  history of major depression INTEGUMENTARY:  [ ]  rashes  [ ]  ulcers CONSTITUTIONAL:  [ ]  fever   [ ]  chills  PHYSICAL EXAM: Filed Vitals:   08/03/13 1514  BP: 157/71  Pulse: 95  Height: 5\' 2"  (1.575 m)  Weight: 238 lb (107.956 kg)  SpO2: 96%   Body mass index is 43.52 kg/(m^2). GENERAL: The patient is a well-nourished female, in no acute distress. The vital signs are documented above. CARDIOVASCULAR: There is a regular rate and rhythm. There is a diminished right femoral pulse. PULMONARY: There is good air exchange bilaterally without wheezing or rales. ABDOMEN: Soft and non-tender with normal pitched bowel sounds. She has a very large pannus. MUSCULOSKELETAL: There are no major deformities or cyanosis. NEUROLOGIC: No focal weakness or paresthesias are detected. SKIN: The wound on her right medial malleolus measures 4 cm x 3 cm. PSYCHIATRIC: The patient has a normal affect.  DATA:  CT Angiogram: there is no significant aortic occlusive disease. There is a focal stenosis noted at the iliac bifurcation. This narrowed the lumen approximately 50% and is stable compared to the prior exam. There is moderate to severe disease throughout the superficial femoral artery on the right. There was three-vessel runoff on the right.  VEIN MAP: Greater saphenous vein in the right leg appears to have acceptable diameters. Diameters range from 0.52 cm-0.84 cm.  Previous ABIs on 07/20/2013 show monophasic Doppler signals in both feet in the dorsalis pedis and posterior tibial positions with an ABI of 31% on the right and 37% on the left.  MEDICAL ISSUES:  Atherosclerosis of native arteries of the extremities with ulceration(440.23) This patient has an extensive nonhealing wound of the right medial malleolus with multilevel arterial occlusive disease. She continues to smoke. We have discussed this on multiple occasions. I  think that without revascularization the wound in the right leg would not heal and she will require a primary amputation. I have discussed with her the option of taking her to the operating room to undergo angioplasty and stenting of the right common iliac artery stenosis with right femoral to below knee popliteal artery bypass grafting. Her arteries are very small and we  have discussed the fact that she would be at increased risk for surgery given her diabetes and morbid obesity. Her BMI is 43. She is at high risk especially for groin wound healing problems. I've explained it is no guarantee that this will result in limb salvage given the extent of the wound. However, I think that this is her best chance for limb salvage. She is hesitant to consider surgery and therefore we discussed the option of simply a dressing the right common iliac artery stenosis via an endovascular approach. This is in excess of plaque which puts it at slightly higher risk for complications. In addition she has a difficult access problem again because of her morbid obesity. We have discussed these risks. However she is agreeable to proceed with this approach and therefore she scheduled for aortogram with runoff and angioplasty and stenting of the right common iliac artery on 08/15/2013.   Savas Elvin S [facility] of [place] Beeper: [phone]

## 2013-08-03 NOTE — Assessment & Plan Note (Signed)
This patient has an extensive nonhealing wound of the right medial malleolus with multilevel arterial occlusive disease. She continues to smoke. We have discussed this on multiple occasions. I think that without revascularization the wound in the right leg would not heal and she will require a primary amputation. I have discussed with her the option of taking her to the operating room to undergo angioplasty and stenting of the right common iliac artery stenosis with right femoral to below knee popliteal artery bypass grafting. Her arteries are very small and we have discussed the fact that she would be at increased risk for surgery given her diabetes and morbid obesity. Her BMI is 43. She is at high risk especially for groin wound healing problems. I've explained it is no guarantee that this will result in limb salvage given the extent of the wound. However, I think that this is her best chance for limb salvage. She is hesitant to consider surgery and therefore we discussed the option of simply a dressing the right common iliac artery stenosis via an endovascular approach. This is in excess of plaque which puts it at slightly higher risk for complications. In addition she has a difficult access problem again because of her morbid obesity. We have discussed these risks. However she is agreeable to proceed with this approach and therefore she scheduled for aortogram with runoff and angioplasty and stenting of the right common iliac artery on 08/15/2013.

## 2013-08-09 ENCOUNTER — Other Ambulatory Visit: Payer: Self-pay | Admitting: *Deleted

## 2013-08-10 ENCOUNTER — Encounter (HOSPITAL_COMMUNITY): Payer: Self-pay | Admitting: Pharmacy Technician

## 2013-08-10 ENCOUNTER — Encounter: Payer: Self-pay | Admitting: *Deleted

## 2013-08-15 ENCOUNTER — Telehealth: Payer: Self-pay | Admitting: Vascular Surgery

## 2013-08-15 ENCOUNTER — Encounter (HOSPITAL_COMMUNITY)
Admission: RE | Disposition: A | Discharge status: Home / Self Care | Payer: Self-pay | Source: Ambulatory Visit | Attending: Vascular Surgery

## 2013-08-15 ENCOUNTER — Ambulatory Visit (HOSPITAL_COMMUNITY)
Admission: RE | Admit: 2013-08-15 | Discharge: 2013-08-15 | Disposition: A | Discharge status: Home / Self Care | Payer: BC Managed Care – PPO | Source: Ambulatory Visit | Attending: Vascular Surgery | Admitting: Vascular Surgery

## 2013-08-15 DIAGNOSIS — F17200 Nicotine dependence, unspecified, uncomplicated: Secondary | ICD-10-CM | POA: Insufficient documentation

## 2013-08-15 DIAGNOSIS — L98499 Non-pressure chronic ulcer of skin of other sites with unspecified severity: Principal | ICD-10-CM | POA: Insufficient documentation

## 2013-08-15 DIAGNOSIS — L97309 Non-pressure chronic ulcer of unspecified ankle with unspecified severity: Secondary | ICD-10-CM | POA: Insufficient documentation

## 2013-08-15 DIAGNOSIS — E785 Hyperlipidemia, unspecified: Secondary | ICD-10-CM | POA: Insufficient documentation

## 2013-08-15 DIAGNOSIS — Z7982 Long term (current) use of aspirin: Secondary | ICD-10-CM | POA: Insufficient documentation

## 2013-08-15 DIAGNOSIS — I739 Peripheral vascular disease, unspecified: Secondary | ICD-10-CM | POA: Insufficient documentation

## 2013-08-15 DIAGNOSIS — I70209 Unspecified atherosclerosis of native arteries of extremities, unspecified extremity: Secondary | ICD-10-CM

## 2013-08-15 DIAGNOSIS — I10 Essential (primary) hypertension: Secondary | ICD-10-CM | POA: Insufficient documentation

## 2013-08-15 DIAGNOSIS — E669 Obesity, unspecified: Secondary | ICD-10-CM | POA: Insufficient documentation

## 2013-08-15 DIAGNOSIS — E119 Type 2 diabetes mellitus without complications: Secondary | ICD-10-CM | POA: Insufficient documentation

## 2013-08-15 HISTORY — PX: ABDOMINAL AORTAGRAM: SHX5454

## 2013-08-15 LAB — POCT I-STAT, CHEM 8
BUN: 8 mg/dL (ref 6–23)
CALCIUM ION: 1.14 mmol/L (ref 1.12–1.23)
CREATININE: 0.5 mg/dL (ref 0.50–1.10)
Chloride: 100 mEq/L (ref 96–112)
Glucose, Bld: 216 mg/dL — ABNORMAL HIGH (ref 70–99)
HCT: 50 % — ABNORMAL HIGH (ref 36.0–46.0)
Hemoglobin: 17 g/dL — ABNORMAL HIGH (ref 12.0–15.0)
Potassium: 4 mEq/L (ref 3.7–5.3)
Sodium: 141 mEq/L (ref 137–147)
TCO2: 28 mmol/L (ref 0–100)

## 2013-08-15 LAB — POCT ACTIVATED CLOTTING TIME
Activated Clotting Time: 215 seconds
Activated Clotting Time: 171 seconds

## 2013-08-15 LAB — GLUCOSE, CAPILLARY: Glucose-Capillary: 174 mg/dL — ABNORMAL HIGH (ref 70–99)

## 2013-08-15 SURGERY — ABDOMINAL AORTAGRAM
Anesthesia: LOCAL | Laterality: Right

## 2013-08-15 MED ORDER — HEPARIN SODIUM (PORCINE) 1000 UNIT/ML IJ SOLN
INTRAMUSCULAR | Status: AC
  Filled 2013-08-15: qty 1

## 2013-08-15 MED ORDER — SODIUM CHLORIDE 0.9 % IV SOLN: 1.0000 mL/kg/h | INTRAVENOUS | Status: DC

## 2013-08-15 MED ORDER — SODIUM CHLORIDE 0.9 % IV SOLN
INTRAVENOUS | Status: DC
  Administered 2013-08-15: 06:00:00 via INTRAVENOUS

## 2013-08-15 MED ORDER — OXYCODONE-ACETAMINOPHEN 5-325 MG PO TABS
1.0000 | ORAL_TABLET | ORAL | Status: DC | PRN
  Administered 2013-08-15 (×2): 2 via ORAL
  Filled 2013-08-15 (×2): qty 2

## 2013-08-15 MED ORDER — FENTANYL CITRATE 0.05 MG/ML IJ SOLN
INTRAMUSCULAR | Status: AC
  Filled 2013-08-15: qty 2

## 2013-08-15 MED ORDER — HEPARIN (PORCINE) IN NACL 2-0.9 UNIT/ML-% IJ SOLN
INTRAMUSCULAR | Status: AC
  Filled 2013-08-15: qty 1000

## 2013-08-15 MED ORDER — MIDAZOLAM HCL 2 MG/2ML IJ SOLN
INTRAMUSCULAR | Status: AC
  Filled 2013-08-15: qty 2

## 2013-08-15 MED ORDER — LIDOCAINE HCL (PF) 1 % IJ SOLN
INTRAMUSCULAR | Status: AC
  Filled 2013-08-15: qty 30

## 2013-08-15 MED ORDER — LABETALOL HCL 5 MG/ML IV SOLN
INTRAVENOUS | Status: AC
  Filled 2013-08-15: qty 4

## 2013-08-15 NOTE — H&P (View-Only) (Signed)
[facility] [place]  Patient name: [name] Shifflett MRN: [id number] DOB: [birth year] Sex: female  REASON FOR VISIT: nonhealing wound of the right medial malleolus with infrainguinal arterial occlusive disease.  HPI: [name] Dietz is a 58 y.o. female who I previously seen with a nonhealing left leg wound secondary to chronic venous insufficiency and infrainguinal arterial occlusive disease. She had undergone an arteriogram that showed on the left side she had diffuse disease of the proximal superficial femoral artery which was occluded above-the-knee. There was reconstitution of the anterior tibial and posterior tibial arteries. Ultimately the wounds on the left leg healed. When I saw her last on 07/20/2013, she had developed extensive wound on her right medial malleolus. This was been treated at the wound care center. It is difficult to say if the wound extends down to the bone. She also described rest pain bilaterally. I set her up for CT angiogram and vein mapping. She comes in today to discuss his results.  There have been no significant changes in her right leg wound.   Past Medical History  Diagnosis Date  . Diabetes mellitus   . Peripheral vascular disease   . Hyperlipidemia   . Obesity   . Hypertension    Family History  Problem Relation Age of Onset  . Cancer Mother   . Heart disease Father   . Hyperlipidemia Father   . Hypertension Father   . Other Father     varicose veins  . Heart attack Father   . Heart disease Sister   . Hyperlipidemia Sister   . Hypertension Sister   . Other Sister     varicose veins  . Heart attack Sister   . Heart disease Brother   . Hypertension Brother   . Hyperlipidemia Brother   . Other Brother     varicose veins  . Heart attack Brother    SOCIAL HISTORY: History  Substance Use Topics  . Smoking status: Current Every Day Smoker -- 1.00 packs/day for 30 years    Types: Cigarettes  . Smokeless tobacco: Never Used      Comment: pt states that she is not quitting smoking and refused smoking cessation materials  . Alcohol Use: No   Allergies  Allergen Reactions  . Adhesive [Tape]   . Penicillins Hives   Current Outpatient Prescriptions  Medication Sig Dispense Refill  . alprazolam (XANAX) 2 MG tablet Take 1 mg by mouth 3 (three) times daily.       Marland Kitchen. aspirin EC 325 MG tablet Take 325 mg by mouth daily.      Marland Kitchen. atorvastatin (LIPITOR) 20 MG tablet Take 20 mg by mouth daily.      . diclofenac sodium (VOLTAREN) 1 % GEL Apply 2 g topically 2 (two) times daily as needed. For right knee pain      . escitalopram (LEXAPRO) 10 MG tablet Take 10 mg by mouth daily.      Marland Kitchen. gabapentin (NEURONTIN) 300 MG capsule Take 300 mg by mouth 3 (three) times daily.      Marland Kitchen. glimepiride (AMARYL) 2 MG tablet Take 2 mg by mouth 2 (two) times daily.      . hydrochlorothiazide (MICROZIDE) 12.5 MG capsule Take 12.5 mg by mouth daily.      . meloxicam (MOBIC) 15 MG tablet Take 15 mg by mouth daily.      . metFORMIN (GLUCOPHAGE) 500 MG tablet Take 1,000 mg by mouth 2 (two) times daily.       .Marland Kitchen  NEXIUM 40 MG capsule Take 40 mg by mouth daily.      . Oxycodone HCl 20 MG TABS Take 20 mg by mouth every 4 (four) hours as needed. For pain      . quinapril (ACCUPRIL) 20 MG tablet Take 20 mg by mouth daily.       No current facility-administered medications for this visit.   REVIEW OF SYSTEMS: Arly.Keller ] denotes positive finding; [  ] denotes negative finding  CARDIOVASCULAR:  [ ]  chest pain   [ ]  chest pressure   [ ]  palpitations   [ ]  orthopnea   [ ]  dyspnea on exertion   [ ]  claudication   [ ]  rest pain   [ ]  DVT   [ ]  phlebitis PULMONARY:   [ ]  productive cough   [ ]  asthma   [ ]  wheezing NEUROLOGIC:   [ ]  weakness  [ ]  paresthesias  [ ]  aphasia  [ ]  amaurosis  [ ]  dizziness HEMATOLOGIC:   [ ]  bleeding problems   [ ]  clotting disorders MUSCULOSKELETAL:  [ ]  joint pain   [ ]  joint swelling [ ]  leg swelling GASTROINTESTINAL: [ ]   blood in stool   [ ]   hematemesis GENITOURINARY:  [ ]   dysuria  [ ]   hematuria PSYCHIATRIC:  [ ]  history of major depression INTEGUMENTARY:  [ ]  rashes  [ ]  ulcers CONSTITUTIONAL:  [ ]  fever   [ ]  chills  PHYSICAL EXAM: Filed Vitals:   08/03/13 1514  BP: 157/71  Pulse: 95  Height: 5\' 2"  (1.575 m)  Weight: 238 lb (107.956 kg)  SpO2: 96%   Body mass index is 43.52 kg/(m^2). GENERAL: The patient is a well-nourished female, in no acute distress. The vital signs are documented above. CARDIOVASCULAR: There is a regular rate and rhythm. There is a diminished right femoral pulse. PULMONARY: There is good air exchange bilaterally without wheezing or rales. ABDOMEN: Soft and non-tender with normal pitched bowel sounds. She has a very large pannus. MUSCULOSKELETAL: There are no major deformities or cyanosis. NEUROLOGIC: No focal weakness or paresthesias are detected. SKIN: The wound on her right medial malleolus measures 4 cm x 3 cm. PSYCHIATRIC: The patient has a normal affect.  DATA:  CT Angiogram: there is no significant aortic occlusive disease. There is a focal stenosis noted at the iliac bifurcation. This narrowed the lumen approximately 50% and is stable compared to the prior exam. There is moderate to severe disease throughout the superficial femoral artery on the right. There was three-vessel runoff on the right.  VEIN MAP: Greater saphenous vein in the right leg appears to have acceptable diameters. Diameters range from 0.52 cm-0.84 cm.  Previous ABIs on 07/20/2013 show monophasic Doppler signals in both feet in the dorsalis pedis and posterior tibial positions with an ABI of 31% on the right and 37% on the left.  MEDICAL ISSUES:  Atherosclerosis of native arteries of the extremities with ulceration(440.23) This patient has an extensive nonhealing wound of the right medial malleolus with multilevel arterial occlusive disease. She continues to smoke. We have discussed this on multiple occasions. I  think that without revascularization the wound in the right leg would not heal and she will require a primary amputation. I have discussed with her the option of taking her to the operating room to undergo angioplasty and stenting of the right common iliac artery stenosis with right femoral to below knee popliteal artery bypass grafting. Her arteries are very small and we  have discussed the fact that she would be at increased risk for surgery given her diabetes and morbid obesity. Her BMI is 43. She is at high risk especially for groin wound healing problems. I've explained it is no guarantee that this will result in limb salvage given the extent of the wound. However, I think that this is her best chance for limb salvage. She is hesitant to consider surgery and therefore we discussed the option of simply a dressing the right common iliac artery stenosis via an endovascular approach. This is in excess of plaque which puts it at slightly higher risk for complications. In addition she has a difficult access problem again because of her morbid obesity. We have discussed these risks. However she is agreeable to proceed with this approach and therefore she scheduled for aortogram with runoff and angioplasty and stenting of the right common iliac artery on 08/15/2013.   Verdia Bolt S [facility] [place]: [phone]

## 2013-08-15 NOTE — Op Note (Signed)
   PATIENT: [name]   MRN: [id number] DOB: 1955/08/26    DATE OF PROCEDURE: 08/15/2013  INDICATIONS: [name] is a 58 y.o. female With a nonhealing wound of her right leg. She comes in for arteriography and right iliac angioplasty. At this point she does not wish to consider femoral-tibial bypass.  PROCEDURE:  1. Ultrasound-guided access to the right common femoral artery 2. Aortogram with iliac arteriogram 3. PT and stenting of the right common iliac artery using an 8 mm x 3 cm Cordis self-expanding stent 4. Post dilatation with a 7 mm x 2 cm balloon 5. Right lower extremity runoff  SURGEON: Di Kindlehristopher S. Edilia Boickson, MD, FACS  ANESTHESIA: local with sedation   EBL: minimal  TECHNIQUE: The patient was taken to the peripheral vascular c1 or number Versed and 50 mcg of fentanyl. After the skin was alert anesthetized with lidocaine, and under ultrasound guidance, I attempted to cannulate common femoral artery with a micropuncture needle but was unable to thread the wire on 2 occasions. For this reason I cannulated ureter with an 18-gauge needle and got good blood return and was able to pass a Benson wire into the external iliac artery. I then used a Kumpe catheter to direct the wire into the common iliac artery and infrarenal aorta. A 5 French sheath was introduced over the wire. The pigtail catheter was positioned at the L1 vertebral body and an aortogram was obtained with bilateral iliac runoff. The catheter was in position above the aortic bifurcation and oblique iliac projection was obtained. There was a 70% plaque at the distal right common iliac artery which I elected to address.  The 5 French sheath was exchanged for a long 6 French sheath and the patient received 6000 units of IV heparin. After the ACT will list to 15 and then selected an 8 mm x 3 cm self expanding stent which was positioned across the stenosis and deployed without difficulty. Postdilatation was done with a 7 mm x  2 cm balloon which was inflated to 10 atmospheres for 60 seconds. There was a mild residual stenosis so I inflated the balloon again after it was carefully positioned to 15 atmospheres for 60 seconds. Completion film showed no residual stenosis.  Next right lower extremity runoff film was obtained.  FINDINGS:  1. The infrarenal aorta is widely patent. The left common iliac, hypogastric, and external iliac arteries are patent. 2. The right common iliac artery is patent however there is a 70% eccentric Plaque in the distal right common iliac artery. This was successfully ballooned and stented as described above. 3. There is mild disease of the common femoral artery. The femoral artery on the right is patent. There is moderate diffuse disease of the proximal superficial femoral artery and severe diffuse disease of the distal superficial femoral artery and popliteal artery which is occluded at the level of the knee. There is reconstitution of the anterior tibial, peroneal, and posterior tibial arteries. The anterior tibial is patent onto the foot as the dorsalis pedis artery. The posterior tibial artery is patent onto the foot.  Waverly Ferrarihristopher Dickson, MD, FACS [facility]  DATE OF DICTATION:   08/15/2013

## 2013-08-15 NOTE — Progress Notes (Signed)
Patient presented from cath lab with active groin bleed.  Dressing removed by Peyton NajjarLarry from cath lab and pressure was applied to stop bleed for at least 15 minutes.  Level 2 hematoma was assessed during active bleed.  Lower extremity pulse found by doppler.  Patient tolerated well pressure hold well.  Skin breakdown noticed during pressure hold which Peyton NajjarLarry from cath lab stated was there prior to admission and patient was made aware.  Dr. Edilia Boickson called and made aware of rebleed.  Dr. Edilia Boickson ordered extended bedrest from 4 hours to 6 hours.  Will continue to monitor patient.

## 2013-08-15 NOTE — Interval H&P Note (Signed)
History and Physical Interval Note:  08/15/2013 7:30 AM  [name]  has presented today for surgery, with the diagnosis of pvd  The various methods of treatment have been discussed with the patient and family. After consideration of risks, benefits and other options for treatment, the patient has consented to  Procedure(s): ABDOMINAL AORTAGRAM (N/A) as a surgical intervention .  The patient's history has been reviewed, patient examined, no change in status, stable for surgery.  I have reviewed the patient's chart and labs.  Questions were answered to the patient's satisfaction.     [name] S

## 2013-08-15 NOTE — Discharge Instructions (Signed)
Angiography, Care After ° °Refer to this sheet in the next few weeks. These instructions provide you with information on caring for yourself after your procedure. Your health care provider may also give you more specific instructions. Your treatment has been planned according to current medical practices, but problems sometimes occur. Call your health care provider if you have any problems or questions after your procedure.  °WHAT TO EXPECT AFTER THE PROCEDURE °After your procedure, it is typical to have the following sensations: °· Minor discomfort or tenderness and a small bump at the catheter insertion site. The bump should usually decrease in size and tenderness within 1 to 2 weeks. °· Any bruising will usually fade within 2 to 4 weeks. °HOME CARE INSTRUCTIONS  °· You may need to keep taking blood thinners if they were prescribed for you. Only take over-the-counter or prescription medicines for pain, fever, or discomfort as directed by your health care provider. °· Do not apply powder or lotion to the site. °· Do not sit in a bathtub, swimming pool, or whirlpool for 5 to 7 days. °· You may shower 24 hours after the procedure. Remove the bandage (dressing) and gently wash the site with plain soap and water. Gently pat the site dry. °· Inspect the site at least twice daily. °· Limit your activity for the first 24 hours. Do not bend, squat, or lift anything over 10 lb (9 kg) or as directed by your health care provider. °· Do not drive home if you are discharged the day of the procedure. Have someone else drive you. Follow instructions about when you can drive or return to work. °SEEK MEDICAL CARE IF: °· You get lightheaded when standing up. °· You have drainage (other than a small amount of blood on the dressing). °· You have chills. °· You have a fever. °· You have redness, warmth, swelling, or pain at the insertion site. °SEEK IMMEDIATE MEDICAL CARE IF:  °· You develop chest pain or shortness of breath, feel  faint, or pass out. °· You have bleeding, swelling larger than a walnut, or drainage from the catheter insertion site. °· You develop pain, discoloration, coldness, or severe bruising in the leg or arm that held the catheter. °· You have heavy bleeding from the site. If this happens, hold pressure on the site. °MAKE SURE YOU: °· Understand these instructions. °· Will watch your condition. °· Will get help right away if you are not doing well or get worse. °Document Released: 12/26/2004 Document Revised: 02/09/2013 Document Reviewed: 11/01/2012 °[employer]® Patient Information ©2014 [employer]. ° °

## 2013-08-15 NOTE — Telephone Encounter (Addendum)
Message copied by Fredrich BirksMILLIKAN, DANA P on Mon Aug 15, 2013  2:48 PM ------      Message from: Phillips OdorPULLINS, CAROL S      Created: Mon Aug 15, 2013 11:19 AM      Regarding: Tiburcio BashKay log                   ----- Message -----         From: Chuck Hinthristopher S Dickson, MD         Sent: 08/15/2013   9:33 AM           To: [facility] Charge Pool      Subject: charge and f/u                                           PROCEDURE:        1. Ultrasound-guided access to the right common femoral artery      2. Aortogram with iliac arteriogram      3. PT and stenting of the right common iliac artery using an 8 mm x 3 cm Cordis self-expanding stent      4. Post dilatation with a 7 mm x 2 cm balloon      5. Right lower extremity runoff                  SURGEON: Di Kindlehristopher S. Edilia Boickson, MD, FACS            She needs a follow up visit in 3-4 weeks. Thank you. CD       ------  08/15/13: lm for pt re appt, dpm

## 2013-08-24 ENCOUNTER — Encounter: Payer: Self-pay | Admitting: Vascular Surgery

## 2013-09-06 ENCOUNTER — Encounter: Payer: Self-pay | Admitting: Vascular Surgery

## 2013-09-07 ENCOUNTER — Encounter: Payer: Self-pay | Admitting: Vascular Surgery

## 2013-09-07 ENCOUNTER — Ambulatory Visit (INDEPENDENT_AMBULATORY_CARE_PROVIDER_SITE_OTHER): Payer: BC Managed Care – PPO | Admitting: Vascular Surgery

## 2013-09-07 VITALS — BP 121/105 | HR 97 | Resp 16 | Ht 62.0 in | Wt 231.0 lb

## 2013-09-07 DIAGNOSIS — L97909 Non-pressure chronic ulcer of unspecified part of unspecified lower leg with unspecified severity: Secondary | ICD-10-CM

## 2013-09-07 DIAGNOSIS — I739 Peripheral vascular disease, unspecified: Secondary | ICD-10-CM

## 2013-09-07 DIAGNOSIS — I878 Other specified disorders of veins: Secondary | ICD-10-CM

## 2013-09-07 DIAGNOSIS — L98499 Non-pressure chronic ulcer of skin of other sites with unspecified severity: Principal | ICD-10-CM

## 2013-09-07 DIAGNOSIS — I872 Venous insufficiency (chronic) (peripheral): Secondary | ICD-10-CM

## 2013-09-07 DIAGNOSIS — I83893 Varicose veins of bilateral lower extremities with other complications: Secondary | ICD-10-CM | POA: Insufficient documentation

## 2013-09-07 NOTE — Assessment & Plan Note (Signed)
This patient clearly has a limb threatening situation of the right lower extremity with extensive wounds of her medial malleolus and also the right second toe. Fortunately she continues to smoke one pack per day of cigarettes. I've discussed with her the importance of tobacco cessation and specifically how this relates to vasospasm of the microcirculation and also graft patency. I think without revascularization she will require a below the knee amputation. I think her only chance for limb salvage would be to attempt a bypass in the right lower extremity. She has significant chronic venous insufficiency and that combined with her obesity and diabetes puts her at very high risk for wound complications. It looks like I could bypass into the anterior tibial artery higher up in her right leg and tried we'll avoid some of the areas with significant chronic venous insufficiency. However, I have explained that even with successful bypass there is significant risk of limb loss. We have discussed the indications for the procedure and the potential complications including but not limited to, bleeding, wound healing problems, graft thrombosis, limb loss, or other unpredictable medical problems. She has not had any recent cardiac symptoms. She has had previous coronary stents in the remote past. She will consider her options and will call if she elects to proceed with surgery. In the meantime her wounds are followed in the wound care center by Dr. Tanda RockersNichols.

## 2013-09-07 NOTE — Progress Notes (Addendum)
[facility] [place]  Patient name: [name] MRN: [id number] DOB: Mar 26, 1956 Sex: female  REASON FOR VISIT: Follow up of leg wound  HPI: [name] is a 58 y.o. female who I have been following with a nonhealing wound of her right medial malleolus. She has known infrainguinal arterial occlusive disease. On 08/15/2013 she was brought in for an arteriogram. She underwent PTA and stenting of the right common iliac artery with an 8 mm x 3 cm Cordis self-expanding stent and post dilatation with a 7 mm x 2 cm balloon.  Below that, she had mild disease in the common femoral artery on the right. The proximal artery was occluded at the level of the knee and there was reconstitution of the anterior tibial, peroneal, and posterior tibial arteries. The anterior tibial artery is patent into the foot as the dorsalis pedis artery.  Her previous vein mapping the right leg showed that the greater saphenous vein appeared to have acceptable diameters with diameters ranging from 0.5-2 cm-0.4 cm.  She continues to smoke one pack per day of cigarettes. Her wounds are followed in the wound care center. She has mild pain associated with her wounds.   Past Medical History  Diagnosis Date  . Diabetes mellitus   . Peripheral vascular disease   . Hyperlipidemia   . Obesity   . Hypertension    Family History  Problem Relation Age of Onset  . Cancer Mother   . Heart disease Father   . Hyperlipidemia Father   . Hypertension Father   . Other Father     varicose veins  . Heart attack Father   . Heart disease Sister   . Hyperlipidemia Sister   . Hypertension Sister   . Other Sister     varicose veins  . Heart attack Sister   . Heart disease Brother   . Hypertension Brother   . Hyperlipidemia Brother   . Other Brother     varicose veins  . Heart attack Brother    SOCIAL HISTORY: History  Substance Use Topics  . Smoking status: Current Every Day Smoker -- 1.00  packs/day for 30 years    Types: Cigarettes  . Smokeless tobacco: Never Used     Comment: pt states that she is not quitting smoking and refused smoking cessation materials  . Alcohol Use: No   Allergies  Allergen Reactions  . Adhesive [Tape]   . Penicillins Hives   Current Outpatient Prescriptions  Medication Sig Dispense Refill  . alprazolam (XANAX) 2 MG tablet Take 1 mg by mouth 3 (three) times daily.       Marland Kitchen aspirin EC 325 MG tablet Take 325 mg by mouth daily.      Marland Kitchen atorvastatin (LIPITOR) 20 MG tablet Take 20 mg by mouth daily.      . clindamycin (CLEOCIN) 300 MG capsule Take 300 mg by mouth 3 (three) times daily.      . diclofenac sodium (VOLTAREN) 1 % GEL Apply 2 g topically 2 (two) times daily as needed. For right knee pain      . escitalopram (LEXAPRO) 10 MG tablet Take 10 mg by mouth daily.      . fentaNYL (DURAGESIC - DOSED MCG/HR) 75 MCG/HR Place 75 mcg onto the skin every 3 (three) days.       . fluticasone (FLONASE) 50 MCG/ACT nasal spray Place 1 spray into both nostrils daily as needed for allergies.       Marland Kitchen  gabapentin (NEURONTIN) 300 MG capsule Take 300 mg by mouth 3 (three) times daily.      Marland Kitchen. glimepiride (AMARYL) 2 MG tablet Take 2 mg by mouth 2 (two) times daily.      . hydrochlorothiazide (MICROZIDE) 12.5 MG capsule Take 12.5 mg by mouth daily.      . meloxicam (MOBIC) 15 MG tablet Take 15 mg by mouth daily.      . metFORMIN (GLUCOPHAGE) 500 MG tablet Take 1,000 mg by mouth 2 (two) times daily.       . Oxycodone HCl 20 MG TABS Take 20 mg by mouth every 4 (four) hours as needed. For pain      . quinapril (ACCUPRIL) 20 MG tablet Take 20 mg by mouth daily.       No current facility-administered medications for this visit.   REVIEW OF SYSTEMS: Arly.Keller[X ] denotes positive finding; [  ] denotes negative finding  CARDIOVASCULAR:  [ ]  chest pain   [ ]  chest pressure   [ ]  palpitations   [ ]  orthopnea   [ ]  dyspnea on exertion   [ ]  claudication   [ ]  rest pain   [ ]  DVT   [ ]   phlebitis PULMONARY:   [ ]  productive cough   [ ]  asthma   [ ]  wheezing NEUROLOGIC:   [ ]  weakness  [ ]  paresthesias  [ ]  aphasia  [ ]  amaurosis  [ ]  dizziness HEMATOLOGIC:   [ ]  bleeding problems   [ ]  clotting disorders MUSCULOSKELETAL:  [ ]  joint pain   [ ]  joint swelling [ ]  leg swelling GASTROINTESTINAL: [ ]   blood in stool  [ ]   hematemesis GENITOURINARY:  [ ]   dysuria  [ ]   hematuria PSYCHIATRIC:  [ ]  history of major depression INTEGUMENTARY:  [ ]  rashes  [ ]  ulcers CONSTITUTIONAL:  [ ]  fever   [ ]  chills  PHYSICAL EXAM: Filed Vitals:   09/07/13 0937  BP: 121/105  Pulse: 97  Resp: 16  Height: 5\' 2"  (1.575 m)  Weight: 231 lb (104.781 kg)   Body mass index is 42.24 kg/(m^2). GENERAL: The patient is an obese  female, in no acute distress. The vital signs are documented above. CARDIOVASCULAR: There is a regular rate and rhythm. She has palpable femoral pulses. I cannot palpate pedal pulses. She has hyperpigmentation and chronic venous insufficiency bilaterally. PULMONARY: There is good air exchange bilaterally without wheezing or rales. ABDOMEN: she is obese and her abdomen is difficult to assess. She has a large pannus. MUSCULOSKELETAL: There are no major deformities or cyanosis. NEUROLOGIC: No focal weakness or paresthesias are detected. SKIN: The wound on her medial malleolus measures 3 cm in maximum width and 4 cm in length.the wound extends down to the tendon over the medial malleolus.  She has a full thickness wound on the dorsum of her right second toe. PSYCHIATRIC: The patient has a normal affect.  DATA:  I have reviewed her arteriogram the results of which are described above.  For VQI Use Only   PRE-ADM LIVING: Home  AMB STATUS: Ambulatory with Assistance  CAD Sx: None  PRIOR CHF: None  STRESS TEST: Arly.Keller[X ] No, [ ]  Normal, [ ]  + ischemia, [ ]  + MI, [ ]  Both   MEDICAL ISSUES:  Atherosclerosis of native arteries of the extremities with ulceration(440.23) This  patient clearly has a limb threatening situation of the right lower extremity with extensive wounds of her medial malleolus and also  the right second toe. Fortunately she continues to smoke one pack per day of cigarettes. I've discussed with her the importance of tobacco cessation and specifically how this relates to vasospasm of the microcirculation and also graft patency. I think without revascularization she will require a below the knee amputation. I think her only chance for limb salvage would be to attempt a bypass in the right lower extremity. She has significant chronic venous insufficiency and that combined with her obesity and diabetes puts her at very high risk for wound complications. It looks like I could bypass into the anterior tibial artery higher up in her right leg and tried we'll avoid some of the areas with significant chronic venous insufficiency. However, I have explained that even with successful bypass there is significant risk of limb loss. We have discussed the indications for the procedure and the potential complications including but not limited to, bleeding, wound healing problems, graft thrombosis, limb loss, or other unpredictable medical problems. She has not had any recent cardiac symptoms. She has had previous coronary stents in the remote past. She will consider her options and will call if she elects to proceed with surgery. In the meantime her wounds are followed in the wound care center by Dr. Tanda Rockers.   Meleane Selinger S [facility]  [place]: [phone]

## 2013-09-23 ENCOUNTER — Encounter (HOSPITAL_COMMUNITY): Payer: Self-pay | Admitting: Emergency Medicine

## 2013-09-23 ENCOUNTER — Inpatient Hospital Stay (HOSPITAL_COMMUNITY): Payer: BC Managed Care – PPO

## 2013-09-23 ENCOUNTER — Emergency Department (HOSPITAL_COMMUNITY): Payer: BC Managed Care – PPO

## 2013-09-23 ENCOUNTER — Inpatient Hospital Stay (HOSPITAL_COMMUNITY)
Admission: EM | Admit: 2013-09-23 | Discharge: 2013-10-03 | DRG: 854 | Disposition: A | Discharge status: Home Health | Payer: BC Managed Care – PPO | Attending: Internal Medicine | Admitting: Internal Medicine

## 2013-09-23 DIAGNOSIS — IMO0002 Reserved for concepts with insufficient information to code with codable children: Secondary | ICD-10-CM | POA: Diagnosis present

## 2013-09-23 DIAGNOSIS — L97909 Non-pressure chronic ulcer of unspecified part of unspecified lower leg with unspecified severity: Secondary | ICD-10-CM

## 2013-09-23 DIAGNOSIS — Z6841 Body Mass Index (BMI) 40.0 and over, adult: Secondary | ICD-10-CM

## 2013-09-23 DIAGNOSIS — M9080 Osteopathy in diseases classified elsewhere, unspecified site: Secondary | ICD-10-CM | POA: Diagnosis present

## 2013-09-23 DIAGNOSIS — I878 Other specified disorders of veins: Secondary | ICD-10-CM

## 2013-09-23 DIAGNOSIS — E1159 Type 2 diabetes mellitus with other circulatory complications: Secondary | ICD-10-CM | POA: Diagnosis present

## 2013-09-23 DIAGNOSIS — E11628 Type 2 diabetes mellitus with other skin complications: Secondary | ICD-10-CM

## 2013-09-23 DIAGNOSIS — I83893 Varicose veins of bilateral lower extremities with other complications: Secondary | ICD-10-CM

## 2013-09-23 DIAGNOSIS — I7025 Atherosclerosis of native arteries of other extremities with ulceration: Secondary | ICD-10-CM | POA: Diagnosis present

## 2013-09-23 DIAGNOSIS — E876 Hypokalemia: Secondary | ICD-10-CM | POA: Diagnosis not present

## 2013-09-23 DIAGNOSIS — M869 Osteomyelitis, unspecified: Secondary | ICD-10-CM | POA: Diagnosis present

## 2013-09-23 DIAGNOSIS — F17200 Nicotine dependence, unspecified, uncomplicated: Secondary | ICD-10-CM | POA: Diagnosis present

## 2013-09-23 DIAGNOSIS — I10 Essential (primary) hypertension: Secondary | ICD-10-CM

## 2013-09-23 DIAGNOSIS — L089 Local infection of the skin and subcutaneous tissue, unspecified: Secondary | ICD-10-CM

## 2013-09-23 DIAGNOSIS — E785 Hyperlipidemia, unspecified: Secondary | ICD-10-CM | POA: Diagnosis present

## 2013-09-23 DIAGNOSIS — G589 Mononeuropathy, unspecified: Secondary | ICD-10-CM | POA: Diagnosis present

## 2013-09-23 DIAGNOSIS — A419 Sepsis, unspecified organism: Principal | ICD-10-CM | POA: Diagnosis present

## 2013-09-23 DIAGNOSIS — R509 Fever, unspecified: Secondary | ICD-10-CM

## 2013-09-23 DIAGNOSIS — E1169 Type 2 diabetes mellitus with other specified complication: Secondary | ICD-10-CM

## 2013-09-23 DIAGNOSIS — Z7982 Long term (current) use of aspirin: Secondary | ICD-10-CM

## 2013-09-23 DIAGNOSIS — M86169 Other acute osteomyelitis, unspecified tibia and fibula: Secondary | ICD-10-CM

## 2013-09-23 DIAGNOSIS — D62 Acute posthemorrhagic anemia: Secondary | ICD-10-CM | POA: Diagnosis not present

## 2013-09-23 DIAGNOSIS — Z880 Allergy status to penicillin: Secondary | ICD-10-CM

## 2013-09-23 DIAGNOSIS — I872 Venous insufficiency (chronic) (peripheral): Secondary | ICD-10-CM | POA: Diagnosis present

## 2013-09-23 DIAGNOSIS — Z8249 Family history of ischemic heart disease and other diseases of the circulatory system: Secondary | ICD-10-CM

## 2013-09-23 DIAGNOSIS — L97309 Non-pressure chronic ulcer of unspecified ankle with unspecified severity: Secondary | ICD-10-CM | POA: Diagnosis present

## 2013-09-23 DIAGNOSIS — L98499 Non-pressure chronic ulcer of skin of other sites with unspecified severity: Secondary | ICD-10-CM | POA: Diagnosis present

## 2013-09-23 DIAGNOSIS — E1165 Type 2 diabetes mellitus with hyperglycemia: Secondary | ICD-10-CM | POA: Diagnosis present

## 2013-09-23 DIAGNOSIS — I739 Peripheral vascular disease, unspecified: Secondary | ICD-10-CM | POA: Diagnosis present

## 2013-09-23 LAB — COMPREHENSIVE METABOLIC PANEL
ALT: 14 U/L (ref 0–35)
AST: 17 U/L (ref 0–37)
Albumin: 3.2 g/dL — ABNORMAL LOW (ref 3.5–5.2)
Alkaline Phosphatase: 81 U/L (ref 39–117)
BUN: 11 mg/dL (ref 6–23)
CALCIUM: 8.7 mg/dL (ref 8.4–10.5)
CO2: 28 meq/L (ref 19–32)
Chloride: 93 mEq/L — ABNORMAL LOW (ref 96–112)
Creatinine, Ser: 0.57 mg/dL (ref 0.50–1.10)
GFR calc non Af Amer: >90 mL/min (ref >90)
GLUCOSE: 306 mg/dL — AB (ref 70–99)
Potassium: 4.7 mEq/L (ref 3.7–5.3)
Sodium: 132 mEq/L — ABNORMAL LOW (ref 137–147)
Total Bilirubin: 0.5 mg/dL (ref 0.3–1.2)
Total Protein: 7 g/dL (ref 6.0–8.3)

## 2013-09-23 LAB — CBC WITH DIFFERENTIAL/PLATELET
Basophils Absolute: 0 10*3/uL (ref 0.0–0.1)
Basophils Relative: 0 % (ref 0–1)
Eosinophils Absolute: 0 10*3/uL (ref 0.0–0.7)
Eosinophils Relative: 0 % (ref 0–5)
HEMATOCRIT: 44 % (ref 36.0–46.0)
Hemoglobin: 14.8 g/dL (ref 12.0–15.0)
LYMPHS PCT: 5 % — AB (ref 12–46)
Lymphs Abs: 1 10*3/uL (ref 0.7–4.0)
MCH: 30 pg (ref 26.0–34.0)
MCHC: 33.6 g/dL (ref 30.0–36.0)
MCV: 89.2 fL (ref 78.0–100.0)
MONO ABS: 1.1 10*3/uL — AB (ref 0.1–1.0)
Monocytes Relative: 6 % (ref 3–12)
Neutro Abs: 17.1 10*3/uL — ABNORMAL HIGH (ref 1.7–7.7)
Neutrophils Relative %: 89 % — ABNORMAL HIGH (ref 43–77)
Platelets: 222 10*3/uL (ref 150–400)
RBC: 4.93 MIL/uL (ref 3.87–5.11)
RDW: 14.5 % (ref 11.5–15.5)
WBC: 19.2 10*3/uL — AB (ref 4.0–10.5)

## 2013-09-23 LAB — INFLUENZA PANEL BY PCR (TYPE A & B)
H1N1 flu by pcr: NOT DETECTED
INFLAPCR: NEGATIVE
Influenza B By PCR: NEGATIVE

## 2013-09-23 LAB — URINALYSIS, ROUTINE W REFLEX MICROSCOPIC
Bilirubin Urine: NEGATIVE
Glucose, UA: 500 mg/dL — AB
Hgb urine dipstick: NEGATIVE
Ketones, ur: NEGATIVE mg/dL
Leukocytes, UA: NEGATIVE
NITRITE: NEGATIVE
Protein, ur: NEGATIVE mg/dL
SPECIFIC GRAVITY, URINE: 1.01 (ref 1.005–1.030)
Urobilinogen, UA: 1 mg/dL (ref 0.0–1.0)
pH: 7 (ref 5.0–8.0)

## 2013-09-23 LAB — GLUCOSE, CAPILLARY: Glucose-Capillary: 226 mg/dL — ABNORMAL HIGH (ref 70–99)

## 2013-09-23 MED ORDER — DEXTROSE 5 % IV SOLN
2.0000 g | Freq: Two times a day (BID) | INTRAVENOUS | Status: AC
  Administered 2013-09-23 – 2013-09-29 (×11): 2 g via INTRAVENOUS
  Filled 2013-09-23 (×17): qty 2

## 2013-09-23 MED ORDER — HYDROCHLOROTHIAZIDE 12.5 MG PO CAPS
12.5000 mg | ORAL_CAPSULE | Freq: Every day | ORAL | Status: DC
  Administered 2013-09-24 – 2013-10-03 (×9): 12.5 mg via ORAL
  Filled 2013-09-23 (×10): qty 1

## 2013-09-23 MED ORDER — ASPIRIN EC 325 MG PO TBEC
325.0000 mg | DELAYED_RELEASE_TABLET | Freq: Every day | ORAL | Status: DC
  Administered 2013-09-24 – 2013-09-27 (×4): 325 mg via ORAL
  Filled 2013-09-23 (×5): qty 1

## 2013-09-23 MED ORDER — IBUPROFEN 400 MG PO TABS
ORAL_TABLET | ORAL | Status: AC
  Filled 2013-09-23: qty 1

## 2013-09-23 MED ORDER — ACETAMINOPHEN 650 MG RE SUPP: 650.0000 mg | Freq: Four times a day (QID) | RECTAL | Status: DC | PRN

## 2013-09-23 MED ORDER — MORPHINE SULFATE 2 MG/ML IJ SOLN: 2.0000 mg | Freq: Four times a day (QID) | INTRAMUSCULAR | Status: DC | PRN

## 2013-09-23 MED ORDER — QUINAPRIL HCL 10 MG PO TABS
20.0000 mg | ORAL_TABLET | Freq: Every day | ORAL | Status: DC
  Filled 2013-09-23 (×4): qty 2

## 2013-09-23 MED ORDER — ACETAMINOPHEN 325 MG PO TABS
650.0000 mg | ORAL_TABLET | Freq: Four times a day (QID) | ORAL | Status: DC | PRN
  Administered 2013-09-24 – 2013-09-26 (×2): 650 mg via ORAL
  Filled 2013-09-23 (×3): qty 2

## 2013-09-23 MED ORDER — DICLOFENAC SODIUM 1 % TD GEL
2.0000 g | Freq: Two times a day (BID) | TRANSDERMAL | Status: DC | PRN
  Filled 2013-09-23: qty 100

## 2013-09-23 MED ORDER — INSULIN ASPART 100 UNIT/ML ~~LOC~~ SOLN
0.0000 [IU] | Freq: Every day | SUBCUTANEOUS | Status: DC
  Administered 2013-09-23 – 2013-09-29 (×3): 2 [IU] via SUBCUTANEOUS

## 2013-09-23 MED ORDER — VANCOMYCIN HCL IN DEXTROSE 1-5 GM/200ML-% IV SOLN
1000.0000 mg | INTRAVENOUS | Status: AC
  Administered 2013-09-23 (×2): 1000 mg via INTRAVENOUS
  Filled 2013-09-23 (×2): qty 200

## 2013-09-23 MED ORDER — FLUTICASONE PROPIONATE 50 MCG/ACT NA SUSP
1.0000 | Freq: Every day | NASAL | Status: DC | PRN
  Filled 2013-09-23: qty 16

## 2013-09-23 MED ORDER — SODIUM CHLORIDE 0.9 % IV SOLN
INTRAVENOUS | Status: DC
Start: 2013-09-23 — End: 2013-09-25
  Administered 2013-09-24: 18:00:00 via INTRAVENOUS

## 2013-09-23 MED ORDER — ACETAMINOPHEN 325 MG PO TABS
650.0000 mg | ORAL_TABLET | Freq: Once | ORAL | Status: AC
  Administered 2013-09-23: 650 mg via ORAL
  Filled 2013-09-23: qty 2

## 2013-09-23 MED ORDER — DEXTROSE 5 % IV SOLN
INTRAVENOUS | Status: AC
  Filled 2013-09-23: qty 2

## 2013-09-23 MED ORDER — VANCOMYCIN HCL IN DEXTROSE 1-5 GM/200ML-% IV SOLN
1000.0000 mg | Freq: Two times a day (BID) | INTRAVENOUS | Status: DC
  Administered 2013-09-24 – 2013-09-25 (×4): 1000 mg via INTRAVENOUS
  Filled 2013-09-23 (×10): qty 200

## 2013-09-23 MED ORDER — ONDANSETRON HCL 4 MG/2ML IJ SOLN: 4.0000 mg | Freq: Four times a day (QID) | INTRAMUSCULAR | Status: DC | PRN

## 2013-09-23 MED ORDER — OSELTAMIVIR PHOSPHATE 75 MG PO CAPS: 75.0000 mg | ORAL_CAPSULE | Freq: Two times a day (BID) | ORAL | Status: DC

## 2013-09-23 MED ORDER — ESCITALOPRAM OXALATE 10 MG PO TABS
10.0000 mg | ORAL_TABLET | Freq: Every day | ORAL | Status: DC
  Administered 2013-09-24 – 2013-10-03 (×9): 10 mg via ORAL
  Filled 2013-09-23 (×10): qty 1

## 2013-09-23 MED ORDER — VANCOMYCIN HCL IN DEXTROSE 1-5 GM/200ML-% IV SOLN
INTRAVENOUS | Status: AC
  Filled 2013-09-23: qty 400

## 2013-09-23 MED ORDER — ONDANSETRON HCL 4 MG PO TABS
4.0000 mg | ORAL_TABLET | Freq: Four times a day (QID) | ORAL | Status: DC | PRN
Start: 2013-09-23 — End: 2013-10-03

## 2013-09-23 MED ORDER — MELOXICAM 7.5 MG PO TABS
15.0000 mg | ORAL_TABLET | Freq: Every day | ORAL | Status: DC
  Administered 2013-09-24 – 2013-10-03 (×9): 15 mg via ORAL
  Filled 2013-09-23 (×8): qty 2
  Filled 2013-09-23 (×2): qty 1
  Filled 2013-09-23: qty 2
  Filled 2013-09-23: qty 1

## 2013-09-23 MED ORDER — IBUPROFEN 400 MG PO TABS
400.0000 mg | ORAL_TABLET | Freq: Once | ORAL | Status: AC
  Administered 2013-09-23: 400 mg via ORAL

## 2013-09-23 MED ORDER — GABAPENTIN 300 MG PO CAPS
300.0000 mg | ORAL_CAPSULE | Freq: Three times a day (TID) | ORAL | Status: DC
  Administered 2013-09-23 – 2013-10-03 (×27): 300 mg via ORAL
  Filled 2013-09-23 (×35): qty 1

## 2013-09-23 MED ORDER — INSULIN ASPART 100 UNIT/ML ~~LOC~~ SOLN
0.0000 [IU] | Freq: Three times a day (TID) | SUBCUTANEOUS | Status: DC
  Administered 2013-09-24 (×2): 5 [IU] via SUBCUTANEOUS
  Administered 2013-09-24: 2 [IU] via SUBCUTANEOUS
  Administered 2013-09-25: 3 [IU] via SUBCUTANEOUS
  Administered 2013-09-25: 8 [IU] via SUBCUTANEOUS
  Administered 2013-09-25: 5 [IU] via SUBCUTANEOUS
  Administered 2013-09-26 – 2013-09-28 (×5): 3 [IU] via SUBCUTANEOUS
  Administered 2013-09-29: 5 [IU] via SUBCUTANEOUS
  Administered 2013-09-29 – 2013-09-30 (×3): 3 [IU] via SUBCUTANEOUS
  Administered 2013-09-30: 18:00:00 via SUBCUTANEOUS
  Administered 2013-10-01 (×3): 5 [IU] via SUBCUTANEOUS
  Administered 2013-10-02 – 2013-10-03 (×5): 3 [IU] via SUBCUTANEOUS

## 2013-09-23 MED ORDER — IOHEXOL 300 MG/ML  SOLN
80.0000 mL | Freq: Once | INTRAMUSCULAR | Status: AC | PRN
  Administered 2013-09-23: 80 mL via INTRAVENOUS

## 2013-09-23 MED ORDER — ENOXAPARIN SODIUM 60 MG/0.6ML ~~LOC~~ SOLN
50.0000 mg | SUBCUTANEOUS | Status: DC
  Administered 2013-09-23 – 2013-09-25 (×3): 50 mg via SUBCUTANEOUS
  Filled 2013-09-23 (×4): qty 0.6

## 2013-09-23 MED ORDER — ATORVASTATIN CALCIUM 20 MG PO TABS
20.0000 mg | ORAL_TABLET | Freq: Every day | ORAL | Status: DC
  Administered 2013-09-23 – 2013-10-02 (×9): 20 mg via ORAL
  Filled 2013-09-23 (×11): qty 1

## 2013-09-23 MED ORDER — FENTANYL 50 MCG/HR TD PT72
75.0000 ug | MEDICATED_PATCH | TRANSDERMAL | Status: DC
  Administered 2013-09-23 – 2013-10-02 (×3): 75 ug via TRANSDERMAL
  Filled 2013-09-23 (×3): qty 1
  Filled 2013-09-23 (×2): qty 2

## 2013-09-23 MED ORDER — SODIUM CHLORIDE 0.9 % IV SOLN
INTRAVENOUS | Status: DC
  Administered 2013-09-23: 16:00:00 via INTRAVENOUS

## 2013-09-23 NOTE — H&P (Addendum)
[facility] History and Physical  [name] ZOX:[id number] DOB: [birth year] DOA: 09/23/2013  Referring physician: Lorre Nick, MD PCP: Kirk Ruths, MD   Chief Complaint:  Brought in by son for AMS X 1 day  HPI:  58 year old obese female with history of diabetes mellitus, hypertension, peripheral vascular disease with nonhealing right medial malleolar wound ( follows with Dr. Jen Mow , s/p PTA and stenting of right common iliac artery about 6 weeks back, follows at wound care center) , ongoing tobacco abuse, was brought to the ED by her son after she was found to have acute confusion since this morning. Patient was leaving for the beach with her sister yesterday evening but started getting confused since last evening and was not oriented to place. She also appeared more sleepy than usual. She also felt nauseous and had poor appetite. Since she was not feeling well she canceled her trip and returned home. Her son noticed that she was feeling more sleepy and she was not oriented to time. She denies any new medications. She reports the ulcer of the right medial malleolus to have improved and has been followed up at wound care regularly. Patient denies headache, dizziness, fever, chills,  vomiting, chest pain, palpitations, SOB, abdominal pain, bowel or urinary symptoms. Denies change in weight but reports poor appetite.   Course in the ED Patient febrile to 102.13F, tachycardic to 128, tachypneic up to 31 with a WBC of 19.2 thousand with predominant neutrophils of 87% meeting criteria for sepsis. Blood pressure was stable. Further blood work including hemoglobin and platelets were normal. Chemistry showed sodium of 132, chloride of 93, potassium of 4.7 CO2 of 28 with normal anion gap. Renal function was normal. Blood glucose was elevated to 306. Liver function was normal. UA showed significant glucose but negative for infection. Blood culture, lactic acid, influenza PCR and urine  cultures were sent from the ED. Chest x-ray and x-ray of the right foot were unremarkable. Patient started on IV normal saline at 125 cc per hour given a dose of Tylenol and Motrin and try hospitalist consulted for admission to step down. On my evaluation the patient appeared fatigued but was quite oriented.  Review of Systems:  Constitutional: Denies fever, chills, diaphoresis, appetite change and fatigue.  reports feeling hot. HEENT: Denies photophobia, eye pain, redness, hearing loss, ear pain, congestion, sore throat, rhinorrhea, sneezing, mouth sores, trouble swallowing, neck pain, neck stiffness and tinnitus.   Respiratory: Denies SOB, DOE, cough, chest tightness,  and wheezing.   Cardiovascular: Denies chest pain, palpitations and leg swelling.  Gastrointestinal: Denies nausea, vomiting, abdominal pain, diarrhea, constipation, blood in stool and abdominal distention.  Genitourinary: Denies dysuria, urgency, frequency, hematuria, flank pain and difficulty urinating.  Endocrine: Denies: hot or cold intolerance, sweats,  polyuria, polydipsia. Musculoskeletal: Denies myalgias, back pain, joint swelling, arthralgias and gait problem. has chronic right leg venous ulcer. Skin: Denies pallor, rash and wound.  Neurological: Weakness, Denies dizziness, seizures, syncope,light-headedness, numbness and headaches.  Hematological: Denies adenopathy.  Psychiatric/Behavioral:  confusion, Denies suicidal ideation, mood changes,nervousness, sleep disturbance and agitation   Past Medical History  Diagnosis Date  . Diabetes mellitus   . Peripheral vascular disease   . Hyperlipidemia   . Obesity   . Hypertension    Past Surgical History  Procedure Laterality Date  . Coronary angioplasty with stent placement    . Cholecystectomy     Social History:  reports that she has been smoking Cigarettes.  She has a 30 pack-year smoking  history. She has never used smokeless tobacco. She reports that she does  not drink alcohol or use illicit drugs.  Allergies  Allergen Reactions  . Adhesive [Tape]   . Penicillins Hives    Family History  Problem Relation Age of Onset  . Cancer Mother   . Heart disease Father   . Hyperlipidemia Father   . Hypertension Father   . Other Father     varicose veins  . Heart attack Father   . Heart disease Sister   . Hyperlipidemia Sister   . Hypertension Sister   . Other Sister     varicose veins  . Heart attack Sister   . Heart disease Brother   . Hypertension Brother   . Hyperlipidemia Brother   . Other Brother     varicose veins  . Heart attack Brother     Prior to Admission medications   Medication Sig Start Date End Date Taking? Authorizing Provider  alprazolam Prudy Feeler) 2 MG tablet Take 1 mg by mouth every 6 (six) hours as needed for sleep or anxiety.  04/06/12  Yes Historical Provider, MD  escitalopram (LEXAPRO) 10 MG tablet Take 10 mg by mouth daily. 05/02/12  Yes Historical Provider, MD  fentaNYL (DURAGESIC - DOSED MCG/HR) 75 MCG/HR Place 75 mcg onto the skin every 3 (three) days.  07/21/13  Yes Historical Provider, MD  gabapentin (NEURONTIN) 300 MG capsule Take 600 mg by mouth 3 (three) times daily.    Yes Historical Provider, MD  hydrochlorothiazide (MICROZIDE) 12.5 MG capsule Take 12.5 mg by mouth daily.   Yes Historical Provider, MD  meloxicam (MOBIC) 15 MG tablet Take 15 mg by mouth daily.   Yes Historical Provider, MD  oxycodone (ROXICODONE) 30 MG immediate release tablet Take 30 mg by mouth every 6 (six) hours as needed for pain.   Yes Historical Provider, MD  quinapril (ACCUPRIL) 20 MG tablet Take 20 mg by mouth daily.   Yes Historical Provider, MD  aspirin EC 325 MG tablet Take 325 mg by mouth daily.    Historical Provider, MD  atorvastatin (LIPITOR) 20 MG tablet Take 20 mg by mouth daily. 03/08/12   Historical Provider, MD  clindamycin (CLEOCIN) 300 MG capsule Take 300 mg by mouth 3 (three) times daily.    Historical Provider, MD   diclofenac sodium (VOLTAREN) 1 % GEL Apply 2 g topically 2 (two) times daily as needed. For right knee pain    Historical Provider, MD  fluticasone (FLONASE) 50 MCG/ACT nasal spray Place 1 spray into both nostrils daily as needed for allergies.  07/21/13   Historical Provider, MD  glimepiride (AMARYL) 2 MG tablet Take 2 mg by mouth 2 (two) times daily.    Historical Provider, MD     Physical Exam:  Filed Vitals:   09/23/13 1626 09/23/13 1715 09/23/13 1735 09/23/13 1808  BP: 133/95   131/67  Pulse: 121 120  121  Temp: 102.4 F (39.1 C)  101 F (38.3 C)   TempSrc: Oral  Oral   Resp: 31 30  21   Height:      Weight:      SpO2: 93% 96%  91%    Constitutional: Vital signs reviewed.  Patient is and elderly female in NAD. Appears flushed HEENT: no pallor, no icterus, moist oral mucosa, no cervical lymphadenopathy Cardiovascular: S1&S2 tachycardic ,no MRG Chest: CTAB, no wheezes, rales, or rhonchi Abdominal: Soft. Non-tender, non-distended, bowel sounds are normal, no masses, organomegaly, or guarding present.  GU: no CVA  tenderness Ext: feels very warm, , no edema.  ulcer measuring 3x 3 cm over right malleolus with some purulent discharge, nontender and no foul-smelling, distal pulses palpable. Healed ulcer over her left medial malleolus  Neurological: A&O x3, normal cranial nerve exam, no focal deficit, no neck rigidity.  Labs on Admission:  Basic Metabolic Panel:  Recent Labs Lab 09/23/13 1555  NA 132*  K 4.7  CL 93*  CO2 28  GLUCOSE 306*  BUN 11  CREATININE 0.57  CALCIUM 8.7   Liver Function Tests:  Recent Labs Lab 09/23/13 1555  AST 17  ALT 14  ALKPHOS 81  BILITOT 0.5  PROT 7.0  ALBUMIN 3.2*   No results found for this basename: LIPASE, AMYLASE,  in the last 168 hours No results found for this basename: AMMONIA,  in the last 168 hours CBC:  Recent Labs Lab 09/23/13 1555  WBC 19.2*  NEUTROABS 17.1*  HGB 14.8  HCT 44.0  MCV 89.2  PLT 222   Cardiac  Enzymes: No results found for this basename: CKTOTAL, CKMB, CKMBINDEX, TROPONINI,  in the last 168 hours BNP: No components found with this basename: POCBNP,  CBG: No results found for this basename: GLUCAP,  in the last 168 hours  Radiological Exams on Admission: Dg Chest 2 View  09/23/2013   CLINICAL DATA:  Fever and weakness  EXAM: CHEST  2 VIEW  COMPARISON:  07/10/2011  FINDINGS: The heart size and mediastinal contours are within normal limits. Both lungs are clear. The visualized skeletal structures are unremarkable.  IMPRESSION: No active cardiopulmonary disease.   Electronically Signed   By: Alcide CleverMark  Lukens M.D.   On: 09/23/2013 17:19   Dg Ankle Complete Right  09/23/2013   CLINICAL DATA:  Right ankle sore  EXAM: RIGHT ANKLE - COMPLETE 3+ VIEW  COMPARISON:  None.  FINDINGS: Changes are noted consistent with a soft tissue wound. No underlying bony abnormality is seen. No bony erosion is noted. Calcaneal spurs are seen.  IMPRESSION: Soft tissue changes without acute bony abnormality.   Electronically Signed   By: Alcide CleverMark  Lukens M.D.   On: 09/23/2013 17:19    EKG: pending  Assessment/Plan  Principal Problem:   Sepsis with altered mental status Admit to stepdown Blood cx sent from ED. Mental status clear on my evaluation. No need for LP at this time. Possibly acute viral illness/ influenza. No neck rigidity or meningeal signs on exam. Initial w/up without any clear source of infection except for right ankle ulcer.  will place on empiric IV vanco and cefepime. Neuro checks q4 hrs. Will hold oxycodone, reduce dose of neurontin. Hold xanax. continue fentanyl patch. Will place of prn morphine for pain.  -Check flu PCR. Will place on empiric tamiflu  supportive care with IV fluids, antiemetics and tylenol. If has persistent fevers with change in mental status, LP needs to be done. Will obtain CT of right foot with contrast to evaluate the ankle ulcer and r/o underlying osteomyelitis. -Wound  consult.   Active Problems: Peripheral vascular disease  has b/l ankle ulcer ( healed over left leg). Follows with Dr. Jen Mowickinson and had stenting of the right common iliac artery in February. Follows at wound care care Center. continue ASA , statin. On narcotics for pain. continue neurontin at lower dose.      Diabetes mellitus Elevated fsg on presentation Hold metformin and glimepiride. . Check A1C, monitor fsg. continue SSI  Obesity  need counseling on weight reduction and exercise   HTN  Stable. Resume home meds  Hyperlipidemia  continue statin  Tobacco abuse  needs counsel on cessation.   Diet:diabetic  DVT prophylaxis: sq lovenox   Code Status: full code Family Communication: discussed with son at bedside Disposition Plan: home once improved  Lavaughn Haberle [facility] Pager [phone]  Total time spent on admission :70 minutes  If 7PM-7AM, please contact night-coverage [email or web address] Password [facility] 09/23/2013, 6:43 PM

## 2013-09-23 NOTE — ED Provider Notes (Signed)
CSN: [id number]     Arrival date & time 09/23/13  1527 History  This chart was scribed for Toy Baker, MD by Dorothey Baseman, ED Scribe. This patient was seen in room APA07/APA07 and the patient's care was started at 3:36 PM.   Chief Complaint  Patient presents with  . Code Sepsis   The history is provided by the patient and a relative (sister). No language interpreter was used.   HPI Comments: [name] is a 58 y.o. female with a history of DM and peripheral vascular disease who presents to the Emergency Department complaining of fever (103.2 measured in the ED) with associated fatigue and a dry cough onset a few days ago. Her sister reports that the patient had some confusion and increased sleepiness onset around 5 hours ago. She reports that her son told her that the patient's speech appeared somewhat slurred yesterday, but believed it to be due to her "pain patch" (fentanyl 75 MCG/HR). Patient presents to the ED with several wounds to the bilateral lower extremities that she states is due to her DM. She denies photophobia, abdominal pain, nausea, emesis, chest pain, urinary symptoms. Patient also has a history of hyperlipidemia and HTN.   Past Medical History  Diagnosis Date  . Diabetes mellitus   . Peripheral vascular disease   . Hyperlipidemia   . Obesity   . Hypertension    Past Surgical History  Procedure Laterality Date  . Coronary angioplasty with stent placement    . Cholecystectomy     Family History  Problem Relation Age of Onset  . Cancer Mother   . Heart disease Father   . Hyperlipidemia Father   . Hypertension Father   . Other Father     varicose veins  . Heart attack Father   . Heart disease Sister   . Hyperlipidemia Sister   . Hypertension Sister   . Other Sister     varicose veins  . Heart attack Sister   . Heart disease Brother   . Hypertension Brother   . Hyperlipidemia Brother   . Other Brother     varicose veins  . Heart attack Brother     History  Substance Use Topics  . Smoking status: Current Every Day Smoker -- 1.00 packs/day for 30 years    Types: Cigarettes  . Smokeless tobacco: Never Used     Comment: pt states that she is not quitting smoking and refused smoking cessation materials  . Alcohol Use: No   OB History   Grav Para Term Preterm Abortions TAB SAB Ect Mult Living                 Review of Systems  Constitutional: Positive for fever and fatigue.  Eyes: Negative for photophobia.  Respiratory: Positive for cough.   Cardiovascular: Negative for chest pain.  Gastrointestinal: Negative for nausea, vomiting and abdominal pain.  Genitourinary: Negative for difficulty urinating.  Skin: Positive for wound.  Neurological: Positive for speech difficulty.  Psychiatric/Behavioral: Positive for confusion and sleep disturbance.  All other systems reviewed and are negative.      Allergies  Adhesive and Penicillins  Home Medications   Current Outpatient Rx  Name  Route  Sig  Dispense  Refill  . alprazolam (XANAX) 2 MG tablet   Oral   Take 1 mg by mouth 3 (three) times daily.          Marland Kitchen aspirin EC 325 MG tablet   Oral   Take  325 mg by mouth daily.         Marland Kitchen atorvastatin (LIPITOR) 20 MG tablet   Oral   Take 20 mg by mouth daily.         . clindamycin (CLEOCIN) 300 MG capsule   Oral   Take 300 mg by mouth 3 (three) times daily.         . diclofenac sodium (VOLTAREN) 1 % GEL   Topical   Apply 2 g topically 2 (two) times daily as needed. For right knee pain         . escitalopram (LEXAPRO) 10 MG tablet   Oral   Take 10 mg by mouth daily.         . fentaNYL (DURAGESIC - DOSED MCG/HR) 75 MCG/HR   Transdermal   Place 75 mcg onto the skin every 3 (three) days.          . fluticasone (FLONASE) 50 MCG/ACT nasal spray   Each Nare   Place 1 spray into both nostrils daily as needed for allergies.          Marland Kitchen gabapentin (NEURONTIN) 300 MG capsule   Oral   Take 300 mg by mouth 3  (three) times daily.         Marland Kitchen glimepiride (AMARYL) 2 MG tablet   Oral   Take 2 mg by mouth 2 (two) times daily.         . hydrochlorothiazide (MICROZIDE) 12.5 MG capsule   Oral   Take 12.5 mg by mouth daily.         . meloxicam (MOBIC) 15 MG tablet   Oral   Take 15 mg by mouth daily.         . metFORMIN (GLUCOPHAGE) 500 MG tablet   Oral   Take 1,000 mg by mouth 2 (two) times daily.          . Oxycodone HCl 20 MG TABS   Oral   Take 20 mg by mouth every 4 (four) hours as needed. For pain         . quinapril (ACCUPRIL) 20 MG tablet   Oral   Take 20 mg by mouth daily.          Triage Vitals: BP 130/34  Pulse 123  Temp(Src) 103.2 F (39.6 C) (Rectal)  Resp 29  Ht 5\' 2"  (1.575 m)  Wt 234 lb (106.142 kg)  BMI 42.79 kg/m2  SpO2 90%  Physical Exam  Nursing note and vitals reviewed. Constitutional: She is oriented to person, place, and time. She appears well-developed and well-nourished.  Non-toxic appearance. No distress.  HENT:  Head: Normocephalic and atraumatic.  Eyes: Conjunctivae, EOM and lids are normal. Pupils are equal, round, and reactive to light.  Neck: Normal range of motion. Neck supple. No tracheal deviation present. No mass present.  Cardiovascular: Regular rhythm and normal heart sounds.  Exam reveals no gallop.   No murmur heard. Tachycardic.   Pulmonary/Chest: Effort normal and breath sounds normal. No stridor. No respiratory distress. She has no decreased breath sounds. She has no wheezes. She has no rhonchi. She has no rales.  Abdominal: Soft. Normal appearance and bowel sounds are normal. She exhibits no distension. There is no tenderness. There is no rebound and no CVA tenderness.  Musculoskeletal: Normal range of motion. She exhibits edema. She exhibits no tenderness.  2+ edema bilaterally.   Neurological: She is alert and oriented to person, place, and time. She has normal strength. No cranial nerve  deficit or sensory deficit. GCS eye  subscore is 4. GCS verbal subscore is 5. GCS motor subscore is 6.  Sleepy, but arousaeble. Distal sensation intact.   Skin: Skin is warm and dry. No abrasion and no rash noted. There is erythema.  Left lower extremity has a healed venous stasis ulcer. Erythema noted at the distal aspect of the left tib/fib. Warm to the touch. No crepitus appreciated.   Right medial ankle has a large ulcer with granulation tissue with surrounding erythema with some purulent drainage.   Psychiatric: She has a normal mood and affect. Her speech is normal and behavior is normal.    ED Course  Procedures (including critical care time)  DIAGNOSTIC STUDIES: Oxygen Saturation is 90% on room air, low by my interpretation.    COORDINATION OF CARE: 3:43 PM- Ordered x-rays of the chest and right ankle, blood culture, lactic acid, CBC, CMP, UA, and urine culture. Ordered IV fluids and Tylenol to manage symptoms. Discussed treatment plan with patient at bedside and patient verbalized agreement.     Labs Review Labs Reviewed  CBC WITH DIFFERENTIAL - Abnormal; Notable for the following:    WBC 19.2 (*)    Neutrophils Relative % 89 (*)    Neutro Abs 17.1 (*)    Lymphocytes Relative 5 (*)    Monocytes Absolute 1.1 (*)    All other components within normal limits  COMPREHENSIVE METABOLIC PANEL - Abnormal; Notable for the following:    Sodium 132 (*)    Chloride 93 (*)    Glucose, Bld 306 (*)    Albumin 3.2 (*)    All other components within normal limits  URINALYSIS, ROUTINE W REFLEX MICROSCOPIC - Abnormal; Notable for the following:    Glucose, UA 500 (*)    All other components within normal limits  CULTURE, BLOOD (ROUTINE X 2)  CULTURE, BLOOD (ROUTINE X 2)  URINE CULTURE  I-STAT CG4 LACTIC ACID, ED   Imaging Review Dg Chest 2 View  09/23/2013   CLINICAL DATA:  Fever and weakness  EXAM: CHEST  2 VIEW  COMPARISON:  07/10/2011  FINDINGS: The heart size and mediastinal contours are within normal limits. Both  lungs are clear. The visualized skeletal structures are unremarkable.  IMPRESSION: No active cardiopulmonary disease.   Electronically Signed   By: Alcide CleverMark  Lukens M.D.   On: 09/23/2013 17:19   Dg Ankle Complete Right  09/23/2013   CLINICAL DATA:  Right ankle sore  EXAM: RIGHT ANKLE - COMPLETE 3+ VIEW  COMPARISON:  None.  FINDINGS: Changes are noted consistent with a soft tissue wound. No underlying bony abnormality is seen. No bony erosion is noted. Calcaneal spurs are seen.  IMPRESSION: Soft tissue changes without acute bony abnormality.   Electronically Signed   By: Alcide CleverMark  Lukens M.D.   On: 09/23/2013 17:19     EKG Interpretation None      MDM   Final diagnoses:  None   Patient labs and x-rays noted here. Urine tests x-rays without signs of infection. Possible infection of her lower extremity noted. Will be admitted to the hospitalist for further management.      Toy BakerAnthony T Natalea Sutliff, MD 09/23/13 [id number]

## 2013-09-23 NOTE — ED Notes (Signed)
Report given to Armanda MagicNedine Rowe, RN ICU. Patient will have CT prior to going to ICU. Next shift RN made aware.

## 2013-09-23 NOTE — ED Notes (Signed)
Patient states she has been feeling bad for several days. States wounds (diabetic) to feet. Patient alert/oriented x 4. Febrile.

## 2013-09-23 NOTE — ED Notes (Signed)
Lac is 2.74. RN made aware.

## 2013-09-23 NOTE — Progress Notes (Signed)
ANTIBIOTIC CONSULT NOTE - INITIAL  Pharmacy Consult for Vancomycin & Cefepime Indication: rule out sepsis  Allergies  Allergen Reactions  . Adhesive [Tape]   . Penicillins Hives    Patient Measurements: Height: 5\' 2"  (157.5 cm) Weight: 234 lb (106.142 kg) IBW/kg (Calculated) : 50.1  Vital Signs: Temp: 101 F (38.3 C) (04/03 1735) Temp src: Oral (04/03 1735) BP: 131/67 mmHg (04/03 1808) Pulse Rate: 121 (04/03 1808) Intake/Output from previous day:   Intake/Output from this shift:    Labs:  Recent Labs  09/23/13 1555  WBC 19.2*  HGB 14.8  PLT 222  CREATININE 0.57   Estimated Creatinine Clearance: 89.9 ml/min (by C-G formula based on Cr of 0.57). No results found for this basename: VANCOTROUGH, VANCOPEAK, VANCORANDOM, GENTTROUGH, GENTPEAK, GENTRANDOM, TOBRATROUGH, TOBRAPEAK, TOBRARND, AMIKACINPEAK, AMIKACINTROU, AMIKACIN,  in the last 72 hours   Microbiology: Recent Results (from the past 720 hour(s))  CULTURE, BLOOD (ROUTINE X 2)     Status: None   Collection Time    09/23/13  4:02 PM      Result Value Ref Range Status   Specimen Description Blood RIGHT HAND   Final   Special Requests     Final   Value: BOTTLES DRAWN AEROBIC AND ANAEROBIC 5 CC EACH BOTTLE   Culture PENDING   Incomplete   Report Status PENDING   Incomplete    Medical History: Past Medical History  Diagnosis Date  . Diabetes mellitus   . Peripheral vascular disease   . Hyperlipidemia   . Obesity   . Hypertension     Medications:  Scheduled:  . oseltamivir  75 mg Oral BID   Assessment: 58 yo obese, diabetic F who presents with fevers & elevated WBC.  She is also tachycardic.   CXR and UA are normal.  She is noted to have diabetic ulcers on LLE.  CT pending.  Renal function is at patient's baseline.  She is being started on empiric, broad-spectrum antibiotics for sepsis.  Vancomycin 4/3>> Cefepime 4/3>>  Goal of Therapy:  Vancomycin trough level 15-20 mcg/ml  Plan:  Cefepime  2gm IV q12h Vancomycin 2gm IV x1 now then 1000mg  IV q12h Check Vancomycin trough at steady state Monitor renal function and cx data   Elson ClanLilliston, Rishav Rockefeller Michelle 09/23/2013,7:14 PM

## 2013-09-23 NOTE — Progress Notes (Signed)
CT scan of the right foot with concern for underlying osteomyelitis. Patient currently stable, however would need an MRI of the foot over the weekend to confirm underlying osteomyelitis. We need to arrange to have [facility] and return back. Orthopedic consult placed.

## 2013-09-24 DIAGNOSIS — L98499 Non-pressure chronic ulcer of skin of other sites with unspecified severity: Secondary | ICD-10-CM

## 2013-09-24 DIAGNOSIS — I739 Peripheral vascular disease, unspecified: Secondary | ICD-10-CM

## 2013-09-24 LAB — BASIC METABOLIC PANEL
BUN: 7 mg/dL (ref 6–23)
CO2: 27 mEq/L (ref 19–32)
Calcium: 8.4 mg/dL (ref 8.4–10.5)
Chloride: 97 mEq/L (ref 96–112)
Creatinine, Ser: 0.43 mg/dL — ABNORMAL LOW (ref 0.50–1.10)
Glucose, Bld: 247 mg/dL — ABNORMAL HIGH (ref 70–99)
Potassium: 3.7 mEq/L (ref 3.7–5.3)
Sodium: 135 mEq/L — ABNORMAL LOW (ref 137–147)

## 2013-09-24 LAB — GLUCOSE, CAPILLARY
GLUCOSE-CAPILLARY: 231 mg/dL — AB (ref 70–99)
Glucose-Capillary: 222 mg/dL — ABNORMAL HIGH (ref 70–99)
Glucose-Capillary: 243 mg/dL — ABNORMAL HIGH (ref 70–99)
Glucose-Capillary: 182 mg/dL — ABNORMAL HIGH (ref 70–99)

## 2013-09-24 LAB — HEMOGLOBIN A1C
Hgb A1c MFr Bld: 7.6 % — ABNORMAL HIGH (ref <5.7)
MEAN PLASMA GLUCOSE: 171 mg/dL — AB (ref <117)

## 2013-09-24 LAB — CBC
HCT: 41.1 % (ref 36.0–46.0)
Hemoglobin: 13.5 g/dL (ref 12.0–15.0)
MCH: 29.3 pg (ref 26.0–34.0)
MCHC: 32.8 g/dL (ref 30.0–36.0)
MCV: 89.2 fL (ref 78.0–100.0)
Platelets: 176 10*3/uL (ref 150–400)
RBC: 4.61 MIL/uL (ref 3.87–5.11)
RDW: 14.5 % (ref 11.5–15.5)
WBC: 11.7 10*3/uL — ABNORMAL HIGH (ref 4.0–10.5)

## 2013-09-24 LAB — MRSA PCR SCREENING: MRSA by PCR: NEGATIVE

## 2013-09-24 MED ORDER — LISINOPRIL 20 MG PO TABS
20.0000 mg | ORAL_TABLET | Freq: Every day | ORAL | Status: DC
  Administered 2013-09-24 – 2013-10-03 (×9): 20 mg via ORAL
  Filled 2013-09-24 (×7): qty 1
  Filled 2013-09-24: qty 2
  Filled 2013-09-24 (×2): qty 1

## 2013-09-24 MED ORDER — INSULIN GLARGINE 100 UNIT/ML ~~LOC~~ SOLN
10.0000 [IU] | Freq: Every day | SUBCUTANEOUS | Status: DC
Start: 2013-09-24 — End: 2013-09-30
  Administered 2013-09-24 – 2013-09-29 (×6): 10 [IU] via SUBCUTANEOUS
  Filled 2013-09-24 (×8): qty 0.1

## 2013-09-24 NOTE — Progress Notes (Signed)
Attempted to call [facility] to [facility].  Receiving RN unable to take report at this time.  Left name and number for receiving RN to call for report. Schonewitz, Candelaria StagersLeigh Anne 09/24/2013

## 2013-09-24 NOTE — Consult Note (Signed)
Reason for Consult: right ankle ulcer  Referring Physician:   [name] is an 58 y.o. female.  HPI: 58 y.o. Followed by Dr. Nils [name] for medial mall ulcer and by vascular surgery s/p recent stent right groin presents with sepsis fever osteomyelitis.   Past Medical History  Diagnosis Date  . Diabetes mellitus   . Peripheral vascular disease   . Hyperlipidemia   . Obesity   . Hypertension     Past Surgical History  Procedure Laterality Date  . Coronary angioplasty with stent placement    . Cholecystectomy      Family History  Problem Relation Age of Onset  . Cancer Mother   . Heart disease Father   . Hyperlipidemia Father   . Hypertension Father   . Other Father     varicose veins  . Heart attack Father   . Heart disease Sister   . Hyperlipidemia Sister   . Hypertension Sister   . Other Sister     varicose veins  . Heart attack Sister   . Heart disease Brother   . Hypertension Brother   . Hyperlipidemia Brother   . Other Brother     varicose veins  . Heart attack Brother     Social History:  reports that she has been smoking Cigarettes.  She has a 30 pack-year smoking history. She has never used smokeless tobacco. She reports that she does not drink alcohol or use illicit drugs.  Allergies:  Allergies  Allergen Reactions  . Penicillins Hives  . Adhesive [Tape] Rash    Medications: I have reviewed the patient's current medications.  Results for orders placed during the hospital encounter of 09/23/13 (from the past 48 hour(s))  URINALYSIS, ROUTINE W REFLEX MICROSCOPIC     Status: Abnormal   Collection Time    09/23/13  3:40 PM      Result Value Ref Range   Color, Urine YELLOW  YELLOW   APPearance CLEAR  CLEAR   Specific Gravity, Urine 1.010  1.005 - 1.030   pH 7.0  5.0 - 8.0   Glucose, UA 500 (*) NEGATIVE mg/dL   Hgb urine dipstick NEGATIVE  NEGATIVE   Bilirubin Urine NEGATIVE  NEGATIVE   Ketones, ur NEGATIVE  NEGATIVE mg/dL   Protein, ur NEGATIVE   NEGATIVE mg/dL   Urobilinogen, UA 1.0  0.0 - 1.0 mg/dL   Nitrite NEGATIVE  NEGATIVE   Leukocytes, UA NEGATIVE  NEGATIVE   Comment: MICROSCOPIC NOT DONE ON URINES WITH NEGATIVE PROTEIN, BLOOD, LEUKOCYTES, NITRITE, OR GLUCOSE <1000 mg/dL.  CULTURE, BLOOD (ROUTINE X 2)     Status: None   Collection Time    09/23/13  3:55 PM      Result Value Ref Range   Specimen Description Blood     Special Requests NONE     Culture NO GROWTH 1 DAY     Report Status PENDING    CBC WITH DIFFERENTIAL     Status: Abnormal   Collection Time    09/23/13  3:55 PM      Result Value Ref Range   WBC 19.2 (*) 4.0 - 10.5 K/uL   RBC 4.93  3.87 - 5.11 MIL/uL   Hemoglobin 14.8  12.0 - 15.0 g/dL   HCT 44.0  36.0 - 46.0 %   MCV 89.2  78.0 - 100.0 fL   MCH 30.0  26.0 - 34.0 pg   MCHC 33.6  30.0 - 36.0 g/dL   RDW 14.5  11.5 -  15.5 %   Platelets 222  150 - 400 K/uL   Neutrophils Relative % 89 (*) 43 - 77 %   Neutro Abs 17.1 (*) 1.7 - 7.7 K/uL   Lymphocytes Relative 5 (*) 12 - 46 %   Lymphs Abs 1.0  0.7 - 4.0 K/uL   Monocytes Relative 6  3 - 12 %   Monocytes Absolute 1.1 (*) 0.1 - 1.0 K/uL   Eosinophils Relative 0  0 - 5 %   Eosinophils Absolute 0.0  0.0 - 0.7 K/uL   Basophils Relative 0  0 - 1 %   Basophils Absolute 0.0  0.0 - 0.1 K/uL  COMPREHENSIVE METABOLIC PANEL     Status: Abnormal   Collection Time    09/23/13  3:55 PM      Result Value Ref Range   Sodium 132 (*) 137 - 147 mEq/L   Potassium 4.7  3.7 - 5.3 mEq/L   Chloride 93 (*) 96 - 112 mEq/L   CO2 28  19 - 32 mEq/L   Glucose, Bld 306 (*) 70 - 99 mg/dL   BUN 11  6 - 23 mg/dL   Creatinine, Ser 0.57  0.50 - 1.10 mg/dL   Calcium 8.7  8.4 - 10.5 mg/dL   Total Protein 7.0  6.0 - 8.3 g/dL   Albumin 3.2 (*) 3.5 - 5.2 g/dL   AST 17  0 - 37 U/L   ALT 14  0 - 35 U/L   Alkaline Phosphatase 81  39 - 117 U/L   Total Bilirubin 0.5  0.3 - 1.2 mg/dL   GFR calc non Af Amer >90  >90 mL/min   GFR calc Af Amer >90  >90 mL/min   Comment: (NOTE)     The eGFR has  been calculated using the CKD EPI equation.     This calculation has not been validated in all clinical situations.     eGFR's persistently <90 mL/min signify possible Chronic Kidney     Disease.  CULTURE, BLOOD (ROUTINE X 2)     Status: None   Collection Time    09/23/13  4:02 PM      Result Value Ref Range   Specimen Description Blood RIGHT HAND     Special Requests       Value: BOTTLES DRAWN AEROBIC AND ANAEROBIC 5 CC EACH BOTTLE   Culture NO GROWTH 1 DAY     Report Status PENDING    INFLUENZA PANEL BY PCR (TYPE A & B, H1N1)     Status: None   Collection Time    09/23/13  6:48 PM      Result Value Ref Range   Influenza A By PCR NEGATIVE  NEGATIVE   Influenza B By PCR NEGATIVE  NEGATIVE   H1N1 flu by pcr NOT DETECTED  NOT DETECTED   Comment:            The Xpert Flu assay (FDA approved for     nasal aspirates or washes and     nasopharyngeal swab specimens), is     intended as an aid in the diagnosis of     influenza and should not be used as     a sole basis for treatment.  GLUCOSE, CAPILLARY     Status: Abnormal   Collection Time    09/23/13  9:26 PM      Result Value Ref Range   Glucose-Capillary 226 (*) 70 - 99 mg/dL   Comment 1 Notify RN  MRSA PCR SCREENING     Status: None   Collection Time    09/24/13 12:15 AM      Result Value Ref Range   MRSA by PCR NEGATIVE  NEGATIVE   Comment:            The GeneXpert MRSA Assay (FDA     approved for NASAL specimens     only), is one component of a     comprehensive MRSA colonization     surveillance program. It is not     intended to diagnose MRSA     infection nor to guide or     monitor treatment for     MRSA infections.  BASIC METABOLIC PANEL     Status: Abnormal   Collection Time    09/24/13  5:29 AM      Result Value Ref Range   Sodium 135 (*) 137 - 147 mEq/L   Potassium 3.7  3.7 - 5.3 mEq/L   Comment: DELTA CHECK NOTED     RESULT REPEATED AND VERIFIED   Chloride 97  96 - 112 mEq/L   CO2 27  19 - 32 mEq/L    Glucose, Bld 247 (*) 70 - 99 mg/dL   BUN 7  6 - 23 mg/dL   Creatinine, Ser 0.43 (*) 0.50 - 1.10 mg/dL   Calcium 8.4  8.4 - 10.5 mg/dL   GFR calc non Af Amer >90  >90 mL/min   GFR calc Af Amer >90  >90 mL/min   Comment: (NOTE)     The eGFR has been calculated using the CKD EPI equation.     This calculation has not been validated in all clinical situations.     eGFR's persistently <90 mL/min signify possible Chronic Kidney     Disease.  CBC     Status: Abnormal   Collection Time    09/24/13  5:29 AM      Result Value Ref Range   WBC 11.7 (*) 4.0 - 10.5 K/uL   RBC 4.61  3.87 - 5.11 MIL/uL   Hemoglobin 13.5  12.0 - 15.0 g/dL   HCT 41.1  36.0 - 46.0 %   MCV 89.2  78.0 - 100.0 fL   MCH 29.3  26.0 - 34.0 pg   MCHC 32.8  30.0 - 36.0 g/dL   RDW 14.5  11.5 - 15.5 %   Platelets 176  150 - 400 K/uL  GLUCOSE, CAPILLARY     Status: Abnormal   Collection Time    09/24/13  7:42 AM      Result Value Ref Range   Glucose-Capillary 222 (*) 70 - 99 mg/dL   Comment 1 Documented in Chart     Comment 2 Notify RN    GLUCOSE, CAPILLARY     Status: Abnormal   Collection Time    09/24/13 11:23 AM      Result Value Ref Range   Glucose-Capillary 243 (*) 70 - 99 mg/dL   Comment 1 Documented in Chart     Comment 2 Notify RN      Dg Chest 2 View  09/23/2013   CLINICAL DATA:  Fever and weakness  EXAM: CHEST  2 VIEW  COMPARISON:  07/10/2011  FINDINGS: The heart size and mediastinal contours are within normal limits. Both lungs are clear. The visualized skeletal structures are unremarkable.  IMPRESSION: No active cardiopulmonary disease.   Electronically Signed   By: Inez Catalina M.D.   On: 09/23/2013 17:19   Dg  Ankle Complete Right  09/23/2013   CLINICAL DATA:  Right ankle sore  EXAM: RIGHT ANKLE - COMPLETE 3+ VIEW  COMPARISON:  None.  FINDINGS: Changes are noted consistent with a soft tissue wound. No underlying bony abnormality is seen. No bony erosion is noted. Calcaneal spurs are seen.  IMPRESSION:  Soft tissue changes without acute bony abnormality.   Electronically Signed   By: Inez Catalina M.D.   On: 09/23/2013 17:19   Ct Foot Right W Contrast  09/23/2013   CLINICAL DATA:  Right ankle ulcer.  Evaluate for osteomyelitis.  EXAM: CT OF THE RIGHT FOOT WITH CONTRAST  TECHNIQUE: Multidetector CT imaging was performed following the standard protocol during bolus administration of intravenous contrast.  CONTRAST:  18m OMNIPAQUE IOHEXOL 300 MG/ML  SOLN  COMPARISON:  DG ANKLE COMPLETE*R* dated 09/23/2013  FINDINGS: Motion artifact is present. Prominent soft tissue swelling is present about the ankle. Soft tissue ulceration appears to be present in this region. Bony erosion in the region of the medial malleolus cannot be excluded and osteomyelitis is suspected. Confirmation with MRI and/or triphasic bone scan can be obtained. Degenerative change present.  IMPRESSION: Diffuse prominent soft tissue swelling is present. Soft tissue ulceration over the medial malleolus appears to be present. Bony erosion in the region of the medial malleolus cannot be excluded and osteomyelitis suspected. Confirmation with MRI and/or triphasic bone scan can be obtained.   Electronically Signed   By: TMarcello Moores Register   On: 09/23/2013 19:40    ROS Blood pressure 115/53, pulse 103, temperature 100 F (37.8 C), temperature source Oral, resp. rate 30, height _0  (1.575 m), weight 107 kg (235 lb 14.3 oz), SpO2 91.00%. Physical Exam General appearance is normal, the patient is alert and oriented x3 with normal mood and affect. Gait not assessed   Right ankle medial mall ulcer superficial 1-2 mm deep by 4 cm long and 5 cm wide  Surrounding erythema Poor DP  PT pulse Rubor in foot  Ankle plantigrade Motion 30 degrees 2nd digit looks ischemic  Sensation pressure intact, proprioception intact   Lymph nodes leg normal  Balance CNA DTR deferred    Assessment/Plan: Medial malleolus ulcer / osteomyelitis Local wound care is  fine. Will need definitive revascularization prior to definitive wound care IV antibiotics needed for 6 weeks [place] [place]   SArther Abbott4/09/2013, 1:57 PM

## 2013-09-24 NOTE — Progress Notes (Addendum)
Patient ID: [name], female   DOB: Mar 09, 1956, 58 y.o.   MRN: [id number]  [facility] PROGRESS NOTE  [name] WGN:[id number] DOB: October 14, 1955 DOA: 09/23/2013 PCP: Kirk Ruths, MD  Brief narrative: 58 year old female with history of diabetes mellitus, hypertension, peripheral vascular disease with nonhealing right medial malleolar wound (follows with Dr. Jen Mow , s/p PTA and stenting of right common iliac artery about 6 weeks back, follows at wound care center) , ongoing tobacco abuse, was brought to the ED by her son after noted to be more confused and progressively worse since the AM prior to this admission. Pt was unable to provide history upon arrival to the ED and her son at bedside explained pt has been more sleepy and has not been eating or drinking much.   Course in the ED  Patient febrile with Tmax = 102.29F, HR in 120's, with respiratory rate in low 30's. WBC 19.1. [facility] asked to admit to SDU for further evaluation and management of presumptive sepsis or unclear source, ? Wound infection vs osteomyelitis.   Principal Problem:   Sepsis - appears to be related to right ankle wound as noted below, ? Osteomyelitis - UA and CXR unremarkable for source of an infection  - pt is clinically stable this AM, reports feeling better - will continue broad spectrum ABX Vancomycin and Maxipime as pt appears to be responding well, WBC is trending down  - MRI is pending, it appears we can continue conservative management with ABX for now and hopefully can avoid surgical intervention  - pt will need transfer to [facility] to continue further management, call Dr. Edilia Bo as pt will need revascularization  - will follow up on orhto recommendations Active Problems:   Atherosclerosis of native arteries of the extremities with ulceration, PVD - in pt who is still smoking - consultation provided on importance of tobacco cessation    HTN - reasonable inpatient control  - continue HCTZ,  Lisinopril   DM, uncontrolled with complications of neuropathy - will continue Lantus and SSI - will check A1C - diabetic educator consultation requested    HLD - continue statin   Consultants:  Ortho   Procedures/Studies:  CXR 09/23/2013   No active cardiopulmonary disease.   Right ankle XRAY 09/23/2013  Soft tissue changes without acute bony abnormality.    Right foot CT 09/23/2013  Diffuse prominent soft tissue swelling is present. Soft tissue ulceration over the medial malleolus appears to be present. Bony erosion in the region of the medial malleolus cannot be excluded and osteomyelitis suspected. Confirmation with MRI and/or triphasic bone scan can be obtained.  Antibiotics:  Vancomycin 4/3 -->  Maxipime 4/3 -->   Code Status: Full Family Communication: Pt at bedside Disposition Plan: Remains inpatient   HPI/Subjective: No events overnight.   Objective: Filed Vitals:   09/24/13 0700 09/24/13 0800 09/24/13 0900 09/24/13 1100  BP: 138/60 142/60 136/59 115/53  Pulse:    103  Temp:  100 F (37.8 C)    TempSrc:  Oral    Resp: 26  30   Height:      Weight:      SpO2:    91%    Intake/Output Summary (Last 24 hours) at 09/24/13 1346 Last data filed at 09/24/13 1100  Gross per 24 hour  Intake 2914.58 ml  Output   1825 ml  Net 1089.58 ml    Exam:   General:  Pt is alert, follows commands appropriately, not in acute distress  Cardiovascular:  Regular rate and rhythm, S1/S2, no murmurs, no rubs, no gallops  Respiratory: Clear to auscultation bilaterally, no wheezing, diminished breath sounds at bases   Abdomen: Soft, non tender, non distended, bowel sounds present, no guarding  Extremities: ulcer measuring 3 x 3 cm over right malleolus with purulent discharge, non tender, non foul-smelling, distal pulses palpable, healed ulcer left medial malleolus   Neuro: Grossly nonfocal  Data Reviewed: Basic Metabolic Panel:  Recent Labs Lab 09/23/13 1555  09/24/13 0529  NA 132* 135*  K 4.7 3.7  CL 93* 97  CO2 28 27  GLUCOSE 306* 247*  BUN 11 7  CREATININE 0.57 0.43*  CALCIUM 8.7 8.4   Liver Function Tests:  Recent Labs Lab 09/23/13 1555  AST 17  ALT 14  ALKPHOS 81  BILITOT 0.5  PROT 7.0  ALBUMIN 3.2*   CBC:  Recent Labs Lab 09/23/13 1555 09/24/13 0529  WBC 19.2* 11.7*  NEUTROABS 17.1*  --   HGB 14.8 13.5  HCT 44.0 41.1  MCV 89.2 89.2  PLT 222 176   CBG:  Recent Labs Lab 09/23/13 2126 09/24/13 0742 09/24/13 1123  GLUCAP 226* 222* 243*    Recent Results (from the past 240 hour(s))  CULTURE, BLOOD (ROUTINE X 2)     Status: None   Collection Time    09/23/13  3:55 PM      Result Value Ref Range Status   Specimen Description Blood   Final   Special Requests NONE   Final   Culture NO GROWTH 1 DAY   Final   Report Status PENDING   Incomplete  CULTURE, BLOOD (ROUTINE X 2)     Status: None   Collection Time    09/23/13  4:02 PM      Result Value Ref Range Status   Specimen Description Blood RIGHT HAND   Final   Special Requests     Final   Value: BOTTLES DRAWN AEROBIC AND ANAEROBIC 5 CC EACH BOTTLE   Culture NO GROWTH 1 DAY   Final   Report Status PENDING   Incomplete  MRSA PCR SCREENING     Status: None   Collection Time    09/24/13 12:15 AM      Result Value Ref Range Status   MRSA by PCR NEGATIVE  NEGATIVE Final   Comment:            The GeneXpert MRSA Assay (FDA     approved for NASAL specimens     only), is one component of a     comprehensive MRSA colonization     surveillance program. It is not     intended to diagnose MRSA     infection nor to guide or     monitor treatment for     MRSA infections.     Scheduled Meds: . aspirin EC  325 mg Oral Daily  . atorvastatin  20 mg Oral q1800  . ceFEPime (MAXIPIME) IV  2 g Intravenous Q12H  . enoxaparin (LOVENOX) injection  50 mg Subcutaneous Q24H  . escitalopram  10 mg Oral Daily  . fentaNYL  75 mcg Transdermal Q72H  . gabapentin  300 mg  Oral TID  . hydrochlorothiazide  12.5 mg Oral Daily  . insulin aspart  0-15 Units Subcutaneous TID WC  . insulin aspart  0-5 Units Subcutaneous QHS  . lisinopril  20 mg Oral Daily  . meloxicam  15 mg Oral Daily  . vancomycin  1,000 mg Intravenous Q12H  Continuous Infusions: . sodium chloride       Debbora Presto, MD  [facility] Pager [phone]  If 7PM-7AM, please contact night-coverage [email or web address] Password [email or web address] 09/24/2013, 1:46 PM   LOS: 1 day

## 2013-09-24 NOTE — Progress Notes (Signed)
Inpatient Diabetes Program Recommendations  AACE/ADA: New Consensus Statement on Inpatient Glycemic Control (2013)  Target Ranges:  Prepandial:   less than 140 mg/dL      Peak postprandial:   less than 180 mg/dL (1-2 hours)      Critically ill patients:  140 - 180 mg/dL   Referral received for 'diabetes'  [facility]renly a [facility] of [facility]. Inpatient Diabetes Program Recommendations HgbA1C: Please check current HgbA1C Presently ordered moderate correction tidwc and HS scale. I added carb mod to diet orders with co-sign required. Lantus to be started tonight. Will follow and glad to assist. Will follow up on Monday, 4/6. For further emminent needs, please page: [phone] Thank you, Kaitlin CoffinAnn Vertis Scheib, RN, CNS, Diabetes Coordinator [phone])

## 2013-09-24 NOTE — Progress Notes (Signed)
[facility] here to receive patient and [facility].  Report given.  Verbalized understanding.  [facility] dc'd to [facility]. Schonewitz, Candelaria StagersLeigh Anne 09/24/2013

## 2013-09-25 ENCOUNTER — Inpatient Hospital Stay (HOSPITAL_COMMUNITY): Payer: BC Managed Care – PPO

## 2013-09-25 DIAGNOSIS — E11628 Type 2 diabetes mellitus with other skin complications: Secondary | ICD-10-CM

## 2013-09-25 DIAGNOSIS — L98499 Non-pressure chronic ulcer of skin of other sites with unspecified severity: Secondary | ICD-10-CM

## 2013-09-25 DIAGNOSIS — I739 Peripheral vascular disease, unspecified: Secondary | ICD-10-CM

## 2013-09-25 DIAGNOSIS — I872 Venous insufficiency (chronic) (peripheral): Secondary | ICD-10-CM

## 2013-09-25 DIAGNOSIS — L089 Local infection of the skin and subcutaneous tissue, unspecified: Secondary | ICD-10-CM

## 2013-09-25 DIAGNOSIS — R5081 Fever presenting with conditions classified elsewhere: Secondary | ICD-10-CM

## 2013-09-25 LAB — CBC
HCT: 39.8 % (ref 36.0–46.0)
Hemoglobin: 13.6 g/dL (ref 12.0–15.0)
MCH: 30.3 pg (ref 26.0–34.0)
MCHC: 34.2 g/dL (ref 30.0–36.0)
MCV: 88.6 fL (ref 78.0–100.0)
Platelets: 169 10*3/uL (ref 150–400)
RBC: 4.49 MIL/uL (ref 3.87–5.11)
RDW: 14.3 % (ref 11.5–15.5)
WBC: 9 10*3/uL (ref 4.0–10.5)

## 2013-09-25 LAB — BASIC METABOLIC PANEL
BUN: 9 mg/dL (ref 6–23)
CHLORIDE: 97 meq/L (ref 96–112)
CO2: 24 meq/L (ref 19–32)
Calcium: 8.6 mg/dL (ref 8.4–10.5)
Creatinine, Ser: 0.41 mg/dL — ABNORMAL LOW (ref 0.50–1.10)
GFR calc Af Amer: >90 mL/min (ref >90)
GFR calc non Af Amer: >90 mL/min (ref >90)
Glucose, Bld: 252 mg/dL — ABNORMAL HIGH (ref 70–99)
Potassium: 3.5 mEq/L — ABNORMAL LOW (ref 3.7–5.3)
Sodium: 136 mEq/L — ABNORMAL LOW (ref 137–147)

## 2013-09-25 LAB — C-REACTIVE PROTEIN: CRP: 35.8 mg/dL — ABNORMAL HIGH (ref <0.60)

## 2013-09-25 LAB — URINE CULTURE
COLONY COUNT: NO GROWTH
Culture: NO GROWTH

## 2013-09-25 LAB — GLUCOSE, CAPILLARY
GLUCOSE-CAPILLARY: 255 mg/dL — AB (ref 70–99)
GLUCOSE-CAPILLARY: 166 mg/dL — AB (ref 70–99)
Glucose-Capillary: 193 mg/dL — ABNORMAL HIGH (ref 70–99)
Glucose-Capillary: 202 mg/dL — ABNORMAL HIGH (ref 70–99)

## 2013-09-25 LAB — SEDIMENTATION RATE: Sed Rate: 70 mm/hr — ABNORMAL HIGH (ref 0–22)

## 2013-09-25 MED ORDER — SODIUM CHLORIDE 0.9 % IJ SOLN
10.0000 mL | Freq: Two times a day (BID) | INTRAMUSCULAR | Status: DC
  Administered 2013-09-28 – 2013-09-29 (×2): 10 mL

## 2013-09-25 MED ORDER — INSULIN ASPART 100 UNIT/ML ~~LOC~~ SOLN
3.0000 [IU] | Freq: Three times a day (TID) | SUBCUTANEOUS | Status: DC
  Administered 2013-09-26 – 2013-09-29 (×8): 3 [IU] via SUBCUTANEOUS
  Administered 2013-09-30: 18:00:00 via SUBCUTANEOUS
  Administered 2013-09-30 – 2013-10-01 (×4): 3 [IU] via SUBCUTANEOUS

## 2013-09-25 MED ORDER — SODIUM CHLORIDE 0.9 % IJ SOLN
10.0000 mL | INTRAMUSCULAR | Status: DC | PRN
  Administered 2013-09-26 – 2013-10-03 (×5): 10 mL

## 2013-09-25 MED ORDER — GADOBENATE DIMEGLUMINE 529 MG/ML IV SOLN
20.0000 mL | Freq: Once | INTRAVENOUS | Status: AC | PRN
  Administered 2013-09-25: 20 mL via INTRAVENOUS

## 2013-09-25 NOTE — Consult Note (Signed)
Patient name: [name] MRN: [id number] DOB: Jun 01, 1956 Sex: female   Referred by: Tat  Reason for referral:  Chief Complaint  Patient presents with  . Fever    HISTORY OF PRESENT ILLNESS: The patient is a complex patient with prior history of peripheral vascular occlusive disease. She has been seen in our office by Dr. Edilia Bo as recently as 3 weeks ago. On 08/15/2013 she underwent successful angioplasty of a stenosis of her distal common iliac artery on the right. I have reviewed her arteriogram from this event. This does show occlusive disease throughout her superficial femoral artery with subtotal occlusions throughout multiple areas. There is a complete occlusion of her popliteal artery with reconstitution of the proximal anterior tibial. She also is reconstitution of her tibioperoneal trunk with flow into both the posterior tibial and peroneal arteries. The posterior tibial and peroneal artery are patent into her foot. She has had extensive ulceration over her medial malleolus on the right and also her great second toe. It is felt that she did not have adequate flow for healing and there had been suggestion of femoral to tibial bypass. She was considering this option. She was recently admitted with fever and potential worsening of her ulceration. We are consult for assistance and her treatment.  Past Medical History  Diagnosis Date  . Diabetes mellitus   . Peripheral vascular disease   . Hyperlipidemia   . Obesity   . Hypertension     Past Surgical History  Procedure Laterality Date  . Coronary angioplasty with stent placement    . Cholecystectomy      History   Social History  . Marital Status: Widowed    Spouse Name: N/A    Number of Children: N/A  . Years of Education: N/A   Occupational History  . Not on file.   Social History Main Topics  . Smoking status: Current Every Day Smoker -- 1.00 packs/day for 30 years    Types: Cigarettes  . Smokeless  tobacco: Never Used     Comment: pt states that she is not quitting smoking and refused smoking cessation materials  . Alcohol Use: No  . Drug Use: No  . Sexual Activity: Not on file   Other Topics Concern  . Not on file   Social History Narrative  . No narrative on file    Family History  Problem Relation Age of Onset  . Cancer Mother   . Heart disease Father   . Hyperlipidemia Father   . Hypertension Father   . Other Father     varicose veins  . Heart attack Father   . Heart disease Sister   . Hyperlipidemia Sister   . Hypertension Sister   . Other Sister     varicose veins  . Heart attack Sister   . Heart disease Brother   . Hypertension Brother   . Hyperlipidemia Brother   . Other Brother     varicose veins  . Heart attack Brother     Allergies as of 09/23/2013 - Review Complete 09/23/2013  Allergen Reaction Noted  . Adhesive [tape]  06/11/2012  . Penicillins Hives 07/10/2011    No current facility-administered medications on file prior to encounter.   Current Outpatient Prescriptions on File Prior to Encounter  Medication Sig Dispense Refill  . alprazolam (XANAX) 2 MG tablet Take 1 mg by mouth every 6 (six) hours as needed for sleep or anxiety.       Marland Kitchen  aspirin EC 325 MG tablet Take 325 mg by mouth daily.      Marland Kitchen. atorvastatin (LIPITOR) 20 MG tablet Take 20 mg by mouth daily.      Marland Kitchen. escitalopram (LEXAPRO) 10 MG tablet Take 10 mg by mouth daily.      . fentaNYL (DURAGESIC - DOSED MCG/HR) 75 MCG/HR Place 75 mcg onto the skin every 3 (three) days.       . fluticasone (FLONASE) 50 MCG/ACT nasal spray Place 1 spray into both nostrils daily as needed for allergies.       Marland Kitchen. gabapentin (NEURONTIN) 300 MG capsule Take 600 mg by mouth 3 (three) times daily.       Marland Kitchen. glimepiride (AMARYL) 2 MG tablet Take 2 mg by mouth 2 (two) times daily.      . hydrochlorothiazide (MICROZIDE) 12.5 MG capsule Take 12.5 mg by mouth daily.      . meloxicam (MOBIC) 15 MG tablet Take 15 mg by  mouth daily.      . quinapril (ACCUPRIL) 20 MG tablet Take 20 mg by mouth daily.         REVIEW OF SYSTEMS:  Reviewed in her chart. The does have prior coronary history. Nothing else of pertinence with negative review PHYSICAL EXAMINATION:  General: The patient is a well-nourished female, in no acute distress. Morbidly obese Vital signs are BP 170/81  Pulse 100  Temp(Src) 98.5 F (36.9 C) (Oral)  Resp 16  Ht 5\' 2"  (1.575 m)  Wt 234 lb 14.4 oz (106.55 kg)  BMI 42.95 kg/m2  SpO2 98% Pulmonary: There is a good air exchange  Abdomen: Soft and non-tender Musculoskeletal: There are no major deformities.  Neurologic: No focal weakness or paresthesias are detected, Skin: Dressing was removed the right foot. She does have an eschar over the dorsum of her second toe on the right. He does have a several centimeter ulcer over the medial malleolus. I do not see bone exposed Psychiatric: The patient has normal affect. Cardiovascular: Palpable radial pulses. No distal pulses palpable     Impression and Plan:  The patient understands the need for bypass. She reports that she was hoping to wait until Dr. Edilia Boickson returned. He is out for 2 weeks We are not really able to wait this long. She did have vein mapping which did show reasonable saphenous vein for attempt. I explained she does have multiple issues against her most particularly the tibial bypass and ongoing smoking. Also her morbid obesity and chronic venous ulceration and make this difficult as well. She does understand the significant risk for limb loss associated with this. We will follow along. Agree with antibiotics and will coordinate timing of her right femoral to tibial bypass    Chinedu Agustin [facility] Office: [phone]

## 2013-09-25 NOTE — Progress Notes (Signed)
Peripherally Inserted Central Catheter/Midline Placement  The IV Nurse has discussed with the patient and/or persons authorized to consent for the patient, the purpose of this procedure and the potential benefits and risks involved with this procedure.  The benefits include less needle sticks, lab draws from the catheter and patient may be discharged home with the catheter.  Risks include, but not limited to, infection, bleeding, blood clot (thrombus formation), and puncture of an artery; nerve damage and irregular heat beat.  Alternatives to this procedure were also discussed.  PICC/Midline Placement Documentation  PICC / Midline Single Lumen 09/25/13 PICC Right Basilic 38 cm 0 cm (Active)  Indication for Insertion or Continuance of Line Home intravenous therapies (PICC only) 09/25/2013  5:55 PM  Exposed Catheter (cm) 0 cm 09/25/2013  5:55 PM  Line Status Flushed;Saline locked;Blood return noted 09/25/2013  5:55 PM  Dressing Change Due 10/02/13 09/25/2013  5:55 PM       Ethelda Chickurrie, Alexanderjames Berg Robert 09/25/2013, 6:10 PM

## 2013-09-25 NOTE — Progress Notes (Signed)
PROGRESS NOTE  [name] QQP:[id number] DOB: 05/14/1956 DOA: 09/23/2013 PCP: Leonides Grills, MD Interim history 58 year old female with a history of diabetes mellitus, hypertension, peripheral vascular disease with a nonhealing right medial malleoli wound presented to the ED at [facility] after the patient's son noted her to be more confused on the day of admission. The patient had been going to the wound care center at [facility], but her right malleolus wound has not made significant progress. The patient had a right common iliac angioplasty and stent placed on 08/15/2013 by Dr. Scot Dock. The patient has not been on any antibiotics recently. In the ED, the patient was noted to be febrile with temperature 100.44F, heart rate 120s, WBC 19.1. She was transferred to [facility] for definitive care vascular surgery as well as antibiotics for her wound. MRI of the right lower extremity has been ordered. Assessment/Plan: Sepsis -Secondary to diabetic foot infection on the right -Chest x-ray and urinalysis are negative -Continue empiric vancomycin and cefepime -Blood cultures remain negative Diabetic foot infection with presumptive right ankle osteomyelitis -MRI foot -Continue antibiotics pending results and culture data -consult wound care nurse -ESR--70 -place PICC Atherosclerosis of native arteries of the extremities with ulceration, PVD -consulted VVS--spoke with Dr. Donnetta Hutching -plans noted for possible revascularization this week Tobacco abuse -This is contributing to her poor wound healing -Tobacco cessation discussed Hypertension -Continue HCTZ and lisinopril DM, with complications of neuropathy and vasculopathy -Hemoglobin A1c 7.6 -Continue Lantus 10 units at bedtime -Start NovoLog 3 units with each meal -NovoLog sliding-scale Hyperlipidemia -Continue statin  Family Communication:   Sister at beside Disposition Plan:   Home when medically stable  Procedures/Studies:  CXR 09/23/2013 No  active cardiopulmonary disease.  Right ankle XRAY 09/23/2013 Soft tissue changes without acute bony abnormality.  Right foot CT 09/23/2013 Diffuse prominent soft tissue swelling is present. Soft tissue ulceration over the medial malleolus appears to be present. Bony erosion in the region of the medial malleolus cannot be excluded and osteomyelitis suspected. Confirmation with MRI and/or triphasic bone scan can be obtained. Antibiotics:  Vancomycin 4/3 -->  Maxipime 4/3 -->      Procedures/Studies: Dg Chest 2 View  09/23/2013   CLINICAL DATA:  Fever and weakness  EXAM: CHEST  2 VIEW  COMPARISON:  07/10/2011  FINDINGS: The heart size and mediastinal contours are within normal limits. Both lungs are clear. The visualized skeletal structures are unremarkable.  IMPRESSION: No active cardiopulmonary disease.   Electronically Signed   By: Inez Catalina M.D.   On: 09/23/2013 17:19   Dg Ankle Complete Right  09/23/2013   CLINICAL DATA:  Right ankle sore  EXAM: RIGHT ANKLE - COMPLETE 3+ VIEW  COMPARISON:  None.  FINDINGS: Changes are noted consistent with a soft tissue wound. No underlying bony abnormality is seen. No bony erosion is noted. Calcaneal spurs are seen.  IMPRESSION: Soft tissue changes without acute bony abnormality.   Electronically Signed   By: Inez Catalina M.D.   On: 09/23/2013 17:19   Ct Foot Right W Contrast  09/23/2013   CLINICAL DATA:  Right ankle ulcer.  Evaluate for osteomyelitis.  EXAM: CT OF THE RIGHT FOOT WITH CONTRAST  TECHNIQUE: Multidetector CT imaging was performed following the standard protocol during bolus administration of intravenous contrast.  CONTRAST:  76m OMNIPAQUE IOHEXOL 300 MG/ML  SOLN  COMPARISON:  DG ANKLE COMPLETE*R* dated 09/23/2013  FINDINGS: Motion artifact is present. Prominent soft tissue swelling is present about the  ankle. Soft tissue ulceration appears to be present in this region. Bony erosion in the region of the medial malleolus cannot be excluded and  osteomyelitis is suspected. Confirmation with MRI and/or triphasic bone scan can be obtained. Degenerative change present.  IMPRESSION: Diffuse prominent soft tissue swelling is present. Soft tissue ulceration over the medial malleolus appears to be present. Bony erosion in the region of the medial malleolus cannot be excluded and osteomyelitis suspected. Confirmation with MRI and/or triphasic bone scan can be obtained.   Electronically Signed   By: Marcello Moores  Register   On: 09/23/2013 19:40         Subjective: Patient denies fevers, chills, chest discomfort, shortness breath, nausea, vomiting, diarrhea, abdominal pain, dysuria, hematuria. No rashes.  Objective: Filed Vitals:   09/24/13 1724 09/24/13 2022 09/25/13 0551 09/25/13 1602  BP: 116/54 112/51 170/81 142/77  Pulse: 87 85 100 77  Temp: 98.9 F (37.2 C) 99.3 F (37.4 C) 98.5 F (36.9 C) 98.1 F (36.7 C)  TempSrc: Oral Oral Oral Oral  Resp: _0 Height:      Weight: 106.55 kg (234 lb 14.4 oz)     SpO2: 100% 93% 98% 94%    Intake/Output Summary (Last 24 hours) at 09/25/13 1640 Last data filed at 09/25/13 1433  Gross per 24 hour  Intake 1041.67 ml  Output   2600 ml  Net -1558.33 ml   Weight change: 0.408 kg (14.4 oz) Exam:   General:  Pt is alert, follows commands appropriately, not in acute distress  HEENT: No icterus, No thrush, No neck mass, Gage/AT  Cardiovascular: RRR, S1/S2, no rubs, no gallops  Respiratory: CTA bilaterally, no wheezing, no crackles, no rhonchi  Abdomen: Soft/+BS, non tender, non distended, no guarding  Extremities: Right medial malleolus ulceration with bone palpable and yellow exudate. Mild surrounding circumferential erythema without any crepitance. No odor. No lymphangitis, No petechiae, No rashes, no synovitis; stage II ulceration of the left medial malleolus area without crepitance, erythema.  Data Reviewed: Basic Metabolic Panel:  Recent Labs Lab 09/23/13 1555 09/24/13 0529  09/25/13 0855  NA 132* 135* 136*  K 4.7 3.7 3.5*  CL 93* 97 97  CO2 _1 GLUCOSE 306* 247* 252*  BUN _2 CREATININE 0.57 0.43* 0.41*  CALCIUM 8.7 8.4 8.6   Liver Function Tests:  Recent Labs Lab 09/23/13 1555  AST 17  ALT 14  ALKPHOS 81  BILITOT 0.5  PROT 7.0  ALBUMIN 3.2*   No results found for this basename: LIPASE, AMYLASE,  in the last 168 hours No results found for this basename: AMMONIA,  in the last 168 hours CBC:  Recent Labs Lab 09/23/13 1555 09/24/13 0529 09/25/13 0855  WBC 19.2* 11.7* 9.0  NEUTROABS 17.1*  --   --   HGB 14.8 13.5 13.6  HCT 44.0 41.1 39.8  MCV 89.2 89.2 88.6  PLT 222 176 169   Cardiac Enzymes: No results found for this basename: CKTOTAL, CKMB, CKMBINDEX, TROPONINI,  in the last 168 hours BNP: No components found with this basename: POCBNP,  CBG:  Recent Labs Lab 09/24/13 1626 09/24/13 2246 09/25/13 0623 09/25/13 1108 09/25/13 1617  GLUCAP 182* 231* 193* 255* 202*    Recent Results (from the past 240 hour(s))  URINE CULTURE     Status: None   Collection Time    09/23/13  3:40 PM      Result Value Ref Range Status   Specimen Description URINE, CATHETERIZED  Final   Special Requests NONE   Final   Culture  Setup Time     Final   Value: 09/24/2013 00:15     Performed at [facility] Count     Final   Value: NO GROWTH     Performed at [facility]   Culture     Final   Value: NO GROWTH     Performed at [facility]   Report Status 09/25/2013 FINAL   Final  CULTURE, BLOOD (ROUTINE X 2)     Status: None   Collection Time    09/23/13  3:55 PM      Result Value Ref Range Status   Specimen Description Blood   Final   Special Requests NONE   Final   Culture NO GROWTH 2 DAYS   Final   Report Status PENDING   Incomplete  CULTURE, BLOOD (ROUTINE X 2)     Status: None   Collection Time    09/23/13  4:02 PM      Result Value Ref Range Status   Specimen Description Blood RIGHT HAND    Final   Special Requests     Final   Value: BOTTLES DRAWN AEROBIC AND ANAEROBIC 5 CC EACH BOTTLE   Culture NO GROWTH 2 DAYS   Final   Report Status PENDING   Incomplete  MRSA PCR SCREENING     Status: None   Collection Time    09/24/13 12:15 AM      Result Value Ref Range Status   MRSA by PCR NEGATIVE  NEGATIVE Final   Comment:            The GeneXpert MRSA Assay (FDA     approved for NASAL specimens     only), is one component of a     comprehensive MRSA colonization     surveillance program. It is not     intended to diagnose MRSA     infection nor to guide or     monitor treatment for     MRSA infections.     Scheduled Meds: . aspirin EC  325 mg Oral Daily  . atorvastatin  20 mg Oral q1800  . ceFEPime (MAXIPIME) IV  2 g Intravenous Q12H  . enoxaparin (LOVENOX) injection  50 mg Subcutaneous Q24H  . escitalopram  10 mg Oral Daily  . fentaNYL  75 mcg Transdermal Q72H  . gabapentin  300 mg Oral TID  . hydrochlorothiazide  12.5 mg Oral Daily  . insulin aspart  0-15 Units Subcutaneous TID WC  . insulin aspart  0-5 Units Subcutaneous QHS  . [START ON 09/26/2013] insulin aspart  3 Units Subcutaneous TID WC  . insulin glargine  10 Units Subcutaneous QHS  . lisinopril  20 mg Oral Daily  . meloxicam  15 mg Oral Daily  . vancomycin  1,000 mg Intravenous Q12H   Continuous Infusions: . sodium chloride 50 mL/hr at 09/24/13 1734     TAT, DAVID, DO  [facility] Pager [phone]  If 7PM-7AM, please contact night-coverage [email or web address] Password [email or web address] 09/25/2013, 4:40 PM   LOS: 2 days  ]

## 2013-09-26 DIAGNOSIS — L089 Local infection of the skin and subcutaneous tissue, unspecified: Secondary | ICD-10-CM

## 2013-09-26 DIAGNOSIS — E1169 Type 2 diabetes mellitus with other specified complication: Secondary | ICD-10-CM

## 2013-09-26 DIAGNOSIS — M86169 Other acute osteomyelitis, unspecified tibia and fibula: Secondary | ICD-10-CM

## 2013-09-26 DIAGNOSIS — M79609 Pain in unspecified limb: Secondary | ICD-10-CM

## 2013-09-26 LAB — BASIC METABOLIC PANEL
BUN: 8 mg/dL (ref 6–23)
CALCIUM: 8.4 mg/dL (ref 8.4–10.5)
CO2: 26 mEq/L (ref 19–32)
Chloride: 99 mEq/L (ref 96–112)
Creatinine, Ser: 0.35 mg/dL — ABNORMAL LOW (ref 0.50–1.10)
GFR calc Af Amer: >90 mL/min (ref >90)
GFR calc non Af Amer: >90 mL/min (ref >90)
GLUCOSE: 190 mg/dL — AB (ref 70–99)
Potassium: 3.6 mEq/L — ABNORMAL LOW (ref 3.7–5.3)
Sodium: 137 mEq/L (ref 137–147)

## 2013-09-26 LAB — GLUCOSE, CAPILLARY
Glucose-Capillary: 168 mg/dL — ABNORMAL HIGH (ref 70–99)
Glucose-Capillary: 193 mg/dL — ABNORMAL HIGH (ref 70–99)
Glucose-Capillary: 171 mg/dL — ABNORMAL HIGH (ref 70–99)
Glucose-Capillary: 171 mg/dL — ABNORMAL HIGH (ref 70–99)

## 2013-09-26 LAB — CBC
HCT: 39.9 % (ref 36.0–46.0)
HEMOGLOBIN: 13.6 g/dL (ref 12.0–15.0)
MCH: 30.1 pg (ref 26.0–34.0)
MCHC: 34.1 g/dL (ref 30.0–36.0)
MCV: 88.3 fL (ref 78.0–100.0)
Platelets: 183 10*3/uL (ref 150–400)
RBC: 4.52 MIL/uL (ref 3.87–5.11)
RDW: 14 % (ref 11.5–15.5)
WBC: 6.8 10*3/uL (ref 4.0–10.5)

## 2013-09-26 LAB — CG4 I-STAT (LACTIC ACID): Lactic Acid, Venous: 2.74 mmol/L — ABNORMAL HIGH (ref 0.5–2.2)

## 2013-09-26 LAB — VANCOMYCIN, TROUGH: Vancomycin Tr: 8.1 ug/mL — ABNORMAL LOW (ref 10.0–20.0)

## 2013-09-26 MED ORDER — VANCOMYCIN HCL 10 G IV SOLR
1500.0000 mg | Freq: Two times a day (BID) | INTRAVENOUS | Status: DC
  Administered 2013-09-26 – 2013-10-03 (×13): 1500 mg via INTRAVENOUS
  Filled 2013-09-26 (×19): qty 1500

## 2013-09-26 MED ORDER — VANCOMYCIN HCL 10 G IV SOLR
1500.0000 mg | INTRAVENOUS | Status: AC
  Administered 2013-09-26: 1500 mg via INTRAVENOUS
  Filled 2013-09-26: qty 1500

## 2013-09-26 MED ORDER — ALPRAZOLAM 0.5 MG PO TABS
0.5000 mg | ORAL_TABLET | Freq: Three times a day (TID) | ORAL | Status: DC | PRN
  Administered 2013-09-26 – 2013-10-02 (×6): 0.5 mg via ORAL
  Filled 2013-09-26 (×6): qty 1

## 2013-09-26 NOTE — Care Management Note (Signed)
    Page 1 of 2   10/03/2013     4:17:15 PM   CARE MANAGEMENT NOTE 10/03/2013  Patient:  Kaitlin, [name]   Account Number:  [id number]  Date Initiated:  09/26/2013  Documentation initiated by:  Joyceann Kruser  Subjective/Objective Assessment:   PT ADM WITH PVD, OSTEO RT ANKLE, SEPSIS ON 09/23/13.     Action/Plan:   WILL FOLLOW FOR DC NEEDS AS PT PROGRESSES.  WILL NEED LONG TERM ABX VIA PICC, PER MD.   Anticipated DC Date:  10/03/2013   Anticipated DC Plan:  [place]  CM consult      [facility] Choice  HOME HEALTH   Choice offered to / List presented to:  C-1 Patient   DME arranged  IV PUMP/EQUIPMENT      DME agency  [facility] arranged  HH-1 RN  [facility].   Status of service:  Completed, signed off Medicare Important Message given?   (If response is "NO", the following Medicare IM given date fields will be blank) Date Medicare IM given:   Date Additional Medicare IM given:    Discharge Disposition:  [place]  Per UR Regulation:  Reviewed for med. necessity/level of care/duration of stay  If discussed at [place] of Stay Meetings, dates discussed:   09/29/2013    Comments:  10/03/13 Taden Witter,RN,BSN [phone] PT [facility] AND HH SERVICE AS ARRANGED.  [facility] AWARE OF DC, AND READY FOR PT AT HOME.  PT DENIES ANY ADDITIONAL HOME NEEDS.  09/30/13 Shaquavia Whisonant,RN,BSN [phone] MET WITH PT TO DISCUSS HH ARRANGEMENTS.  PT AWARE OF NEED FOR IV ABX AT DC FOR 6WEEKS.  SHE STATES THAT HER SON CAN ASSIST WITH INFUSIONS.  REFERRAL TO [facility], PER PT CHOICE. START OF CARE 24H POST DC DATE.  WILL NEED RX FOR IV ANTIBIOTICS FOR HH AGENCY PRIOR TO DC.  09/27/13 Brandell Maready,RN,BSN [phone] TO OR TODAY FOR FEM-TIB BYPASS

## 2013-09-26 NOTE — Progress Notes (Signed)
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 Sharol Given for debridement and possible bone biosy Atherosclerosis of native arteries of the extremities with ulceration, PVD  -consulted [facility]--spoke with Dr. Donnetta Hutching  -plans noted for possible revascularization this week  Tobacco abuse  -This is contributing to her poor wound healing  -Tobacco cessation discussed  Hypertension  -Continue HCTZ and lisinopril  DM, with complications of neuropathy and vasculopathy  -Hemoglobin A1c 7.6  -Continue Lantus 10 units at bedtime  -Start NovoLog 3 units with each meal  -NovoLog sliding-scale  Hyperlipidemia  -Continue statin  Family Communication: Sister at beside  Disposition Plan: Home when medically stable  Procedures/Studies:  CXR 09/23/2013 No active cardiopulmonary disease.  Right ankle XRAY 09/23/2013 Soft tissue changes without acute bony abnormality.  Right foot CT 09/23/2013 Diffuse prominent soft tissue swelling is present. Soft tissue ulceration over the medial malleolus appears to be present. Bony erosion in the region of the medial malleolus cannot be excluded and osteomyelitis suspected. Confirmation with MRI and/or triphasic bone scan can be obtained. Antibiotics:  Vancomycin 4/3 -->  Maxipime 4/3 -->         Procedures/Studies: Dg Chest 2 View  09/23/2013   CLINICAL DATA:  Fever and weakness  EXAM: CHEST  2 VIEW  COMPARISON:  07/10/2011  FINDINGS: The heart size and mediastinal  contours are within normal limits. Both lungs are clear. The visualized skeletal structures are unremarkable.  IMPRESSION: No active cardiopulmonary disease.   Electronically Signed   By: Inez Catalina M.D.   On: 09/23/2013 17:19   Dg Ankle Complete Right  09/23/2013   CLINICAL DATA:  Right ankle sore  EXAM: RIGHT ANKLE - COMPLETE 3+ VIEW  COMPARISON:  None.  FINDINGS: Changes are noted consistent with a soft tissue wound. No underlying bony abnormality is seen. No bony erosion is noted. Calcaneal spurs are seen.  IMPRESSION: Soft tissue changes without acute bony abnormality.   Electronically Signed   By: Inez Catalina M.D.   On: 09/23/2013 17:19   Ct Foot Right W Contrast  09/23/2013   CLINICAL DATA:  Right ankle ulcer.  Evaluate for osteomyelitis.  EXAM: CT OF THE RIGHT FOOT WITH CONTRAST  TECHNIQUE: Multidetector CT imaging was performed following the standard protocol during bolus administration of intravenous contrast.  CONTRAST:  72m OMNIPAQUE IOHEXOL 300 MG/ML  SOLN  COMPARISON:  DG ANKLE COMPLETE*R* dated 09/23/2013  FINDINGS: Motion artifact is present. Prominent soft tissue swelling is present about the ankle. Soft tissue ulceration appears to be present in this region. Bony erosion in the region of the medial malleolus cannot be excluded and osteomyelitis is suspected. Confirmation with MRI and/or triphasic bone scan can be obtained. Degenerative change present.  IMPRESSION: Diffuse prominent soft tissue swelling is present. Soft tissue ulceration over the medial malleolus appears to be present. Bony erosion in the region of the medial malleolus cannot be excluded and osteomyelitis suspected. Confirmation with MRI and/or triphasic bone scan can be obtained.   Electronically Signed   By: TMarcello Moores Register  On: 09/23/2013 19:40   Mr Foot Right W Wo Contrast  09/25/2013   CLINICAL DATA:  Osteomyelitis.  Medial ankle ulcer  EXAM: MRI OF THE RIGHT FOREFOOT WITHOUT AND WITH CONTRAST  TECHNIQUE: Multiplanar,  multisequence MR imaging was performed both before and after administration of intravenous contrast.  CONTRAST:  3m MULTIHANCE GADOBENATE DIMEGLUMINE 529 MG/ML IV SOLN  COMPARISON:  CT scan 09/23/2013  FINDINGS: As demonstrated on the CT scan there is a medial ankle ulceration which appears to grow right down to the bone. The underlying cortex was abnormal on the CT scan and there is mild edema and enhancement on the MRI consistent with cortical and subcortical osteomyelitis. No focal drainable soft tissue abscess. There is diffuse cellulitis and myositis but no findings for pyomyositis or septic arthritis.  The medial and lateral ankle tendons are intact. The posterior tibialis tendon is running right through the infected area and is very attenuated. The Achilles tendon and plantar fascia are intact. Moderate ankle, midfoot and hindfoot degenerative changes.  IMPRESSION: 1. 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 2. Cellulitis and myofasciitis but no findings for discrete drainable abscess or pyomyositis. 3. The posterior tibialis tendon is likely involved with the infection and is very attenuated.   Electronically Signed   By: MKalman JewelsM.D.   On: 09/25/2013 16:52   Dg Chest Port 1 View  09/25/2013   CLINICAL DATA:  Right PICC line  EXAM: PORTABLE CHEST - 1 VIEW  COMPARISON:  09/23/2013  FINDINGS: Cardiomediastinal silhouette is stable. Right arm PICC line in place with tip distal SVC. No pneumothorax. No acute infiltrate or pulmonary edema.  IMPRESSION: Right PICC line in place.  No pneumothorax.   Electronically Signed   By: LLahoma CrockerM.D.   On: 09/25/2013 18:12         Subjective: Patient denies fevers, chills, headache, chest pain, dyspnea, nausea, vomiting, diarrhea, abdominal pain, dysuria, hematuria   Objective: Filed Vitals:   09/25/13 0551 09/25/13 1602 09/25/13 2108 09/26/13 0600  BP: 170/81 142/77  109/62 173/76  Pulse: 100 77 75 77  Temp: 98.5 F (36.9 C) 98.1 F (36.7 C) 97.9 F (36.6 C) 98.2 F (36.8 C)  TempSrc: Oral Oral Oral Oral  Resp: _0 Height:      Weight:      SpO2: 98% 94% 99% 99%    Intake/Output Summary (Last 24 hours) at 09/26/13 1406 Last data filed at 09/26/13 0700  Gross per 24 hour  Intake    980 ml  Output   4450 ml  Net  -3470 ml   Weight change:  Exam:   General:  Pt is alert, follows commands appropriately, not in acute distress  HEENT: No icterus, No thrush,  [place]/AT  Cardiovascular: RRR, S1/S2, no rubs, no gallops  Respiratory: CTA bilaterally, no wheezing, no crackles, no rhonchi  Abdomen: Soft/+BS, non tender, non distended, no guarding Extremities:Right medial malleolus ulceration with bone palpable and yellow exudate. Mild surrounding circumferential erythema without any crepitance. No odor. No lymphangitis, No petechiae, No rashes, no synovitis; stage II ulceration of the left medial malleolus area without crepitance, erythema.   Data Reviewed: Basic Metabolic Panel:  Recent Labs Lab 09/23/13 1555 09/24/13 0529 09/25/13 0855 09/26/13 0500  NA 132* 135* 136* 137  K 4.7 3.7 3.5* 3.6*  CL 93* 97 97 99  CO2 _1 GLUCOSE 306* 247* 252*  190*  BUN _0 CREATININE 0.57 0.43* 0.41* 0.35*  CALCIUM 8.7 8.4 8.6 8.4   Liver Function Tests:  Recent Labs Lab 09/23/13 1555  AST 17  ALT 14  ALKPHOS 81  BILITOT 0.5  PROT 7.0  ALBUMIN 3.2*   No results found for this basename: LIPASE, AMYLASE,  in the last 168 hours No results found for this basename: AMMONIA,  in the last 168 hours CBC:  Recent Labs Lab 09/23/13 1555 09/24/13 0529 09/25/13 0855 09/26/13 0500  WBC 19.2* 11.7* 9.0 6.8  NEUTROABS 17.1*  --   --   --   HGB 14.8 13.5 13.6 13.6  HCT 44.0 41.1 39.8 39.9  MCV 89.2 89.2 88.6 88.3  PLT 222 176 169 183   Cardiac Enzymes: No results found for this basename: CKTOTAL, CKMB, CKMBINDEX,  TROPONINI,  in the last 168 hours BNP: No components found with this basename: POCBNP,  CBG:  Recent Labs Lab 09/25/13 1108 09/25/13 1617 09/25/13 2110 09/26/13 0645 09/26/13 1135  GLUCAP 255* 202* 166* 168* 193*    Recent Results (from the past 240 hour(s))  URINE CULTURE     Status: None   Collection Time    09/23/13  3:40 PM      Result Value Ref Range Status   Specimen Description URINE, CATHETERIZED   Final   Special Requests NONE   Final   Culture  Setup Time     Final   Value: 09/24/2013 00:15     Performed at [facility]     Final   Value: NO GROWTH     Performed at [facility]   Culture     Final   Value: NO GROWTH     Performed at [facility]   Report Status 09/25/2013 FINAL   Final  CULTURE, BLOOD (ROUTINE X 2)     Status: None   Collection Time    09/23/13  3:55 PM      Result Value Ref Range Status   Specimen Description Blood   Final   Special Requests NONE   Final   Culture NO GROWTH 3 DAYS   Final   Report Status PENDING   Incomplete  CULTURE, BLOOD (ROUTINE X 2)     Status: None   Collection Time    09/23/13  4:02 PM      Result Value Ref Range Status   Specimen Description Blood RIGHT HAND   Final   Special Requests     Final   Value: BOTTLES DRAWN AEROBIC AND ANAEROBIC 5 CC EACH BOTTLE   Culture NO GROWTH 3 DAYS   Final   Report Status PENDING   Incomplete  MRSA PCR SCREENING     Status: None   Collection Time    09/24/13 12:15 AM      Result Value Ref Range Status   MRSA by PCR NEGATIVE  NEGATIVE Final   Comment:            The GeneXpert MRSA Assay (FDA     approved for NASAL specimens     only), is one component of a     comprehensive MRSA colonization     surveillance program. It is not     intended to diagnose MRSA     infection nor to guide or     monitor treatment for     MRSA infections.     Scheduled Meds: . aspirin EC  325 mg Oral Daily  . atorvastatin  20 mg Oral q1800  .  ceFEPime (MAXIPIME) IV  2 g Intravenous Q12H  . enoxaparin (LOVENOX) injection  50 mg Subcutaneous Q24H  . escitalopram  10 mg Oral Daily  . fentaNYL  75 mcg Transdermal Q72H  . gabapentin  300 mg Oral TID  . hydrochlorothiazide  12.5 mg Oral Daily  . insulin aspart  0-15 Units Subcutaneous TID WC  . insulin aspart  0-5 Units Subcutaneous QHS  . insulin aspart  3 Units Subcutaneous TID WC  . insulin glargine  10 Units Subcutaneous QHS  . lisinopril  20 mg Oral Daily  . meloxicam  15 mg Oral Daily  . sodium chloride  10-40 mL Intracatheter Q12H  . vancomycin  1,500 mg Intravenous NOW  . vancomycin  1,500 mg Intravenous Q12H   Continuous Infusions:    Antonina Deziel, DO  [facility] Pager [phone]  If 7PM-7AM, please contact night-coverage [email or web address] Password [email or web address] 09/26/2013, 2:06 PM   LOS: 3 days

## 2013-09-26 NOTE — Progress Notes (Signed)
*  PRELIMINARY RESULTS* Vascular Ultrasound Left lower extremity venous duplex has been completed.  Preliminary findings: no evidence of DVT in visualized veins. Very limited views of calf veins due to edema.   Farrel DemarkJill Eunice, RDMS, RVT  09/26/2013, 3:23 PM

## 2013-09-26 NOTE — Consult Note (Signed)
WOC wound consult note Reason for Consult: evaluation of LE bilateral medial malleolar wounds.  Pt with significant history of abnormal ABI from 2013 L: 0.30   R: 0.51.  She has had over 10 year history of wounds [place]  Some have healed over time. She presents now with open ulceration of the right medial malleolar and the small area on the left medial malleolus. She has toe tip ulcers of the right second toe bilaterally. Both orthopedics and vascular have been consulted. She needs revascularization surgery and has not yet decided on this. She also has MRI results this am that she does not appear to be knowledgeable of the results, therefore I explained her MD would go over these results with her.  Open wound on the medial aspect of the ankle with underlying  cortical and subcortical osteomyelitis involving the tibia.  2. Cellulitis and myofasciitis but no findings for discrete  drainable abscess or pyomyositis.  3. The posterior tibialis tendon is likely involved with the  infection and is very attenuated.     With the vascular issues and the osteomyelitis this patient will need both issues addressed for any hopes of wound healing.  She also is a smoker which will impede her ability to heal. I will order conservative treatment at this point per the notes of the [facility]VS and ortho for topical care until further decisions on her care can be made.    Wound type: arterial ulcers bilateral LE Measurement: R medial: 4.0cm x 5.5cm x 0.2cm L medial: 2.0cm x 3.0cm x 0.2cm  R 2nd toe: 3.0cm x .1.0cm x 0 L 2nd toe: 0.5cm x 0.5cm x 0 Wound bed: R medial: 80% yellow slough/ 20% pink, moist, palpable bone centrally L medial: 100% yellow soft slough R and L toe tips, stable dry eschar  Drainage (amount, consistency, odor) minimal from the right malleolus, none at the other sites Periwound:intact,  However it is noted she reports new erythema  of the LLE, she does have  some hemosiderin staining bilaterally but the redness does appear new. She has some venous dermatitis bilaterally and 2+ edema on the left LE.  Dressing procedure/placement/frequency: Silver hydrofiber to manage exudate and attempt to keep as dry and stable if possible until further decisions on her care can be made. Change every M/W/F  Discussed POC with patient and bedside nurse.  Re consult if needed, will not follow at this time. Thanks  Amarii Amy [employer]ocker[employer]in RN, CWOCN [phone])

## 2013-09-26 NOTE — Progress Notes (Signed)
ANTIBIOTIC CONSULT NOTE - FOLLOW UP  Pharmacy Consult for Vancomycin and Cefepime Indication: LE wounds, osteomyelitis  Allergies  Allergen Reactions  . Penicillins Hives    Tolerates cefepime  . Adhesive [Tape] Rash    Patient Measurements: Height: 5\' 2"  (157.5 cm) Weight: 234 lb 14.4 oz (106.55 kg) IBW/kg (Calculated) : 50.1  Vital Signs: Temp: 98.2 F (36.8 C) (04/06 0600) Temp src: Oral (04/06 0600) BP: 173/76 mmHg (04/06 0600) Pulse Rate: 77 (04/06 0600) Intake/Output from previous day: 04/05 0701 - 04/06 0700 In: 1340 [P.O.:840; IV Piggyback:500] Out: 4450 [Urine:4450] Intake/Output from this shift:    Labs:  Recent Labs  09/24/13 0529 09/25/13 0855 09/26/13 0500  WBC 11.7* 9.0 6.8  HGB 13.5 13.6 13.6  PLT 176 169 183  CREATININE 0.43* 0.41* 0.35*   Estimated Creatinine Clearance: 90.1 ml/min (by C-G formula based on Cr of 0.35).  Recent Labs  09/26/13 0835  VANCOTROUGH 8.1*     Microbiology: Recent Results (from the past 720 hour(s))  URINE CULTURE     Status: None   Collection Time    09/23/13  3:40 PM      Result Value Ref Range Status   Specimen Description URINE, CATHETERIZED   Final   Special Requests NONE   Final   Culture  Setup Time     Final   Value: 09/24/2013 00:15     Performed at [facility] Count     Final   Value: NO GROWTH     Performed at [facility]   Culture     Final   Value: NO GROWTH     Performed at [facility]   Report Status 09/25/2013 FINAL   Final  CULTURE, BLOOD (ROUTINE X 2)     Status: None   Collection Time    09/23/13  3:55 PM      Result Value Ref Range Status   Specimen Description Blood   Final   Special Requests NONE   Final   Culture NO GROWTH 3 DAYS   Final   Report Status PENDING   Incomplete  CULTURE, BLOOD (ROUTINE X 2)     Status: None   Collection Time    09/23/13  4:02 PM      Result Value Ref Range Status   Specimen Description Blood RIGHT HAND    Final   Special Requests     Final   Value: BOTTLES DRAWN AEROBIC AND ANAEROBIC 5 CC EACH BOTTLE   Culture NO GROWTH 3 DAYS   Final   Report Status PENDING   Incomplete  MRSA PCR SCREENING     Status: None   Collection Time    09/24/13 12:15 AM      Result Value Ref Range Status   MRSA by PCR NEGATIVE  NEGATIVE Final   Comment:            The GeneXpert MRSA Assay (FDA     approved for NASAL specimens     only), is one component of a     comprehensive MRSA colonization     surveillance program. It is not     intended to diagnose MRSA     infection nor to guide or     monitor treatment for     MRSA infections.    Anti-infectives   Start     Dose/Rate Route Frequency Ordered Stop   09/26/13 2200  vancomycin (VANCOCIN) 1,500 mg in sodium chloride 0.9 %  500 mL IVPB     1,500 mg 250 mL/hr over 120 Minutes Intravenous Every 12 hours 09/26/13 1209     09/26/13 1215  vancomycin (VANCOCIN) 1,500 mg in sodium chloride 0.9 % 500 mL IVPB     1,500 mg 250 mL/hr over 120 Minutes Intravenous NOW 09/26/13 1209 09/27/13 1215   09/24/13 0900  vancomycin (VANCOCIN) IVPB 1000 mg/200 mL premix  Status:  Discontinued     1,000 mg 200 mL/hr over 60 Minutes Intravenous Every 12 hours 09/23/13 1914 09/26/13 1209   09/23/13 2200  oseltamivir (TAMIFLU) capsule 75 mg  Status:  Discontinued     75 mg Oral 2 times daily 09/23/13 1839 09/23/13 2105   09/23/13 2000  ceFEPIme (MAXIPIME) 2 g in dextrose 5 % 50 mL IVPB     2 g 100 mL/hr over 30 Minutes Intravenous Every 12 hours 09/23/13 1914     09/23/13 2000  vancomycin (VANCOCIN) IVPB 1000 mg/200 mL premix     1,000 mg 200 mL/hr over 60 Minutes Intravenous Every 1 hr x 2 09/23/13 1914 09/23/13 2323      Assessment: 58 yo F with hx PVD and multiple LE wounds concerning for infection/osteo.  Pt has been seen by [facility] who recommend LE arterial bypass and pt is considering timing of procedure.  Pt continues on day#5 of empiric IV antibiotics.  Cx  NGTD.  Vancomycin trough today = 8.1 on Vancomycin 1gm IV q12h regimen.  Ideally would like trough closer to 15 given significance of wounds and concern for osteo.  Goal of Therapy:  Vancomycin trough level 15-20 mcg/ml  Plan:  Increase Vancomycin dose to 1500mg  IV q12h.  Pt may need a q8h regimen to achieve desired levels but given the liklihood of home IV abx will continue a12h dosing for now. Continue Cefepime 2gm IV q12h. Vancomycin trough as indicated. Follow-up plans for surgical intervention and long term antibiotics.  Toys 'R' UsKimberly Lamyra Malcolm, Pharm.D., BCPS Clinical Pharmacist Pager [phone] 09/26/2013 12:18 PM

## 2013-09-26 NOTE — Progress Notes (Signed)
Subjective: Interval History: none.. comfortable. Having some discomfort in her left calf which appears to be related to her venous stasis disease. Remains afebrile Objective: Vital signs in last 24 hours: Temp:  [97.9 F (36.6 C)-98.2 F (36.8 C)] 98.2 F (36.8 C) (04/06 0600) Pulse Rate:  [75-77] 77 (04/06 0600) Resp:  [16-17] 16 (04/06 0600) BP: (109-173)/(62-77) 173/76 mmHg (04/06 0600) SpO2:  [94 %-99 %] 99 % (04/06 0600)  Intake/Output from previous day: 04/05 0701 - 04/06 0700 In: 1340 [P.O.:840; IV Piggyback:500] Out: 4450 [Urine:4450] Intake/Output this shift:    Erythema and thickening in the skin of her left calf and ankle related to venous stasis disease. No change in her right medial malleolus ulcer.  Lab Results:  Recent Labs  09/25/13 0855 09/26/13 0500  WBC 9.0 6.8  HGB 13.6 13.6  HCT 39.8 39.9  PLT 169 183   BMET  Recent Labs  09/25/13 0855 09/26/13 0500  NA 136* 137  K 3.5* 3.6*  CL 97 99  CO2 24 26  GLUCOSE 252* 190*  BUN 9 8  CREATININE 0.41* 0.35*  CALCIUM 8.6 8.4    Studies/Results: Dg Chest 2 View  09/23/2013   CLINICAL DATA:  Fever and weakness  EXAM: CHEST  2 VIEW  COMPARISON:  07/10/2011  FINDINGS: The heart size and mediastinal contours are within normal limits. Both lungs are clear. The visualized skeletal structures are unremarkable.  IMPRESSION: No active cardiopulmonary disease.   Electronically Signed   By: Alcide Clever M.D.   On: 09/23/2013 17:19   Dg Ankle Complete Right  09/23/2013   CLINICAL DATA:  Right ankle sore  EXAM: RIGHT ANKLE - COMPLETE 3+ VIEW  COMPARISON:  None.  FINDINGS: Changes are noted consistent with a soft tissue wound. No underlying bony abnormality is seen. No bony erosion is noted. Calcaneal spurs are seen.  IMPRESSION: Soft tissue changes without acute bony abnormality.   Electronically Signed   By: Alcide Clever M.D.   On: 09/23/2013 17:19   Ct Foot Right W Contrast  09/23/2013   CLINICAL DATA:  Right ankle  ulcer.  Evaluate for osteomyelitis.  EXAM: CT OF THE RIGHT FOOT WITH CONTRAST  TECHNIQUE: Multidetector CT imaging was performed following the standard protocol during bolus administration of intravenous contrast.  CONTRAST:  80mL OMNIPAQUE IOHEXOL 300 MG/ML  SOLN  COMPARISON:  DG ANKLE COMPLETE*R* dated 09/23/2013  FINDINGS: Motion artifact is present. Prominent soft tissue swelling is present about the ankle. Soft tissue ulceration appears to be present in this region. Bony erosion in the region of the medial malleolus cannot be excluded and osteomyelitis is suspected. Confirmation with MRI and/or triphasic bone scan can be obtained. Degenerative change present.  IMPRESSION: Diffuse prominent soft tissue swelling is present. Soft tissue ulceration over the medial malleolus appears to be present. Bony erosion in the region of the medial malleolus cannot be excluded and osteomyelitis suspected. Confirmation with MRI and/or triphasic bone scan can be obtained.   Electronically Signed   By: Maisie Fus  Register   On: 09/23/2013 19:40   Mr Foot Right W Wo Contrast  09/25/2013   CLINICAL DATA:  Osteomyelitis.  Medial ankle ulcer  EXAM: MRI OF THE RIGHT FOREFOOT WITHOUT AND WITH CONTRAST  TECHNIQUE: Multiplanar, multisequence MR imaging was performed both before and after administration of intravenous contrast.  CONTRAST:  20mL MULTIHANCE GADOBENATE DIMEGLUMINE 529 MG/ML IV SOLN  COMPARISON:  CT scan 09/23/2013  FINDINGS: As demonstrated on the CT scan there is a medial  ankle ulceration which appears to grow right down to the bone. The underlying cortex was abnormal on the CT scan and there is mild edema and enhancement on the MRI consistent with cortical and subcortical osteomyelitis. No focal drainable soft tissue abscess. There is diffuse cellulitis and myositis but no findings for pyomyositis or septic arthritis.  The medial and lateral ankle tendons are intact. The posterior tibialis tendon is running right through the  infected area and is very attenuated. The Achilles tendon and plantar fascia are intact. Moderate ankle, midfoot and hindfoot degenerative changes.  IMPRESSION: 1. 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 2. Cellulitis and myofasciitis but no findings for discrete drainable abscess or pyomyositis. 3. The posterior tibialis tendon is likely involved with the infection and is very attenuated.   Electronically Signed   By: Loralie ChampagneMark  Gallerani M.D.   On: 09/25/2013 16:52   Dg Chest Port 1 View  09/25/2013   CLINICAL DATA:  Right PICC line  EXAM: PORTABLE CHEST - 1 VIEW  COMPARISON:  09/23/2013  FINDINGS: Cardiomediastinal silhouette is stable. Right arm PICC line in place with tip distal SVC. No pneumothorax. No acute infiltrate or pulmonary edema.  IMPRESSION: Right PICC line in place.  No pneumothorax.   Electronically Signed   By: Natasha MeadLiviu  Pop M.D.   On: 09/25/2013 18:12   Anti-infectives: Anti-infectives   Start     Dose/Rate Route Frequency Ordered Stop   09/26/13 2200  vancomycin (VANCOCIN) 1,500 mg in sodium chloride 0.9 % 500 mL IVPB     1,500 mg 250 mL/hr over 120 Minutes Intravenous Every 12 hours 09/26/13 1209     09/26/13 1215  vancomycin (VANCOCIN) 1,500 mg in sodium chloride 0.9 % 500 mL IVPB     1,500 mg 250 mL/hr over 120 Minutes Intravenous NOW 09/26/13 1209 09/27/13 1215   09/24/13 0900  vancomycin (VANCOCIN) IVPB 1000 mg/200 mL premix  Status:  Discontinued     1,000 mg 200 mL/hr over 60 Minutes Intravenous Every 12 hours 09/23/13 1914 09/26/13 1209   09/23/13 2200  oseltamivir (TAMIFLU) capsule 75 mg  Status:  Discontinued     75 mg Oral 2 times daily 09/23/13 1839 09/23/13 2105   09/23/13 2000  ceFEPIme (MAXIPIME) 2 g in dextrose 5 % 50 mL IVPB     2 g 100 mL/hr over 30 Minutes Intravenous Every 12 hours 09/23/13 1914     09/23/13 2000  vancomycin (VANCOCIN) IVPB 1000 mg/200 mL premix     1,000 mg 200  mL/hr over 60 Minutes Intravenous Every 1 hr x 2 09/23/13 1914 09/23/13 2323      Assessment/Plan: s/p * No surgery found * Stable. Remains afebrile and normal white count. MRI results noted with possible osteo- at the level of the medial malleolus over the ulcer. Again discussed with patient the need for revascularization for any hope for healing this. Coordinating this for operative time in the next one to 2 days. Discussed the procedure of the vein harvest for femoral to tibial bypass.   LOS: 3 days   Elene Downum 09/26/2013, 2:29 PM

## 2013-09-26 NOTE — Consult Note (Signed)
Reason for Consult: Venous stasis ulceration right medial malleolus possible osteomyelitis Referring Physician: Dr Tat  [name] is an 58 y.o. female.  HPI: Patient is a 58 year old woman with venous stasis insufficiency with chronic venous stasis ulcers bilateral lower extremities. She also has peripheral vascular disease status post stent placement to the right lower extremity. Patient has been treated in [facility] wound care center for approximately 8 months with Dr. Nils Pyle. Patient states the wounds have not shown any improvement. MRI scan shows concern for possible osteomyelitis beneath the ulcer medial malleolus right ankle.  Past Medical History  Diagnosis Date  . Diabetes mellitus   . Peripheral vascular disease   . Hyperlipidemia   . Obesity   . Hypertension     Past Surgical History  Procedure Laterality Date  . Coronary angioplasty with stent placement    . Cholecystectomy      Family History  Problem Relation Age of Onset  . Cancer Mother   . Heart disease Father   . Hyperlipidemia Father   . Hypertension Father   . Other Father     varicose veins  . Heart attack Father   . Heart disease Sister   . Hyperlipidemia Sister   . Hypertension Sister   . Other Sister     varicose veins  . Heart attack Sister   . Heart disease Brother   . Hypertension Brother   . Hyperlipidemia Brother   . Other Brother     varicose veins  . Heart attack Brother     Social History:  reports that she has been smoking Cigarettes.  She has a 30 pack-year smoking history. She has never used smokeless tobacco. She reports that she does not drink alcohol or use illicit drugs.  Allergies:  Allergies  Allergen Reactions  . Penicillins Hives    Tolerates cefepime  . Adhesive [Tape] Rash    Medications: I have reviewed the patient's current medications.  Results for orders placed during the hospital encounter of 09/23/13 (from the past 48 hour(s))  GLUCOSE, CAPILLARY     Status:  Abnormal   Collection Time    09/24/13 10:46 PM      Result Value Ref Range   Glucose-Capillary 231 (*) 70 - 99 mg/dL  GLUCOSE, CAPILLARY     Status: Abnormal   Collection Time    09/25/13  6:23 AM      Result Value Ref Range   Glucose-Capillary 193 (*) 70 - 99 mg/dL  BASIC METABOLIC PANEL     Status: Abnormal   Collection Time    09/25/13  8:55 AM      Result Value Ref Range   Sodium 136 (*) 137 - 147 mEq/L   Potassium 3.5 (*) 3.7 - 5.3 mEq/L   Chloride 97  96 - 112 mEq/L   CO2 24  19 - 32 mEq/L   Glucose, Bld 252 (*) 70 - 99 mg/dL   BUN 9  6 - 23 mg/dL   Creatinine, Ser 0.41 (*) 0.50 - 1.10 mg/dL   Calcium 8.6  8.4 - 10.5 mg/dL   GFR calc non Af Amer >90  >90 mL/min   GFR calc Af Amer >90  >90 mL/min   Comment: (NOTE)     The eGFR has been calculated using the CKD EPI equation.     This calculation has not been validated in all clinical situations.     eGFR's persistently <90 mL/min signify possible Chronic Kidney     Disease.  CBC     Status: None   Collection Time    09/25/13  8:55 AM      Result Value Ref Range   WBC 9.0  4.0 - 10.5 K/uL   RBC 4.49  3.87 - 5.11 MIL/uL   Hemoglobin 13.6  12.0 - 15.0 g/dL   HCT 39.8  36.0 - 46.0 %   MCV 88.6  78.0 - 100.0 fL   MCH 30.3  26.0 - 34.0 pg   MCHC 34.2  30.0 - 36.0 g/dL   RDW 14.3  11.5 - 15.5 %   Platelets 169  150 - 400 K/uL  SEDIMENTATION RATE     Status: Abnormal   Collection Time    09/25/13  8:55 AM      Result Value Ref Range   Sed Rate 70 (*) 0 - 22 mm/hr  C-REACTIVE PROTEIN     Status: Abnormal   Collection Time    09/25/13  8:55 AM      Result Value Ref Range   CRP 35.8 (*) <0.60 mg/dL   Comment: (NOTE)     Result repeated and verified.     Result confirmed by automatic dilution.     Performed at [place], CAPILLARY     Status: Abnormal   Collection Time    09/25/13 11:08 AM      Result Value Ref Range   Glucose-Capillary 255 (*) 70 - 99 mg/dL  GLUCOSE, CAPILLARY     Status:  Abnormal   Collection Time    09/25/13  4:17 PM      Result Value Ref Range   Glucose-Capillary 202 (*) 70 - 99 mg/dL  GLUCOSE, CAPILLARY     Status: Abnormal   Collection Time    09/25/13  9:10 PM      Result Value Ref Range   Glucose-Capillary 166 (*) 70 - 99 mg/dL  CBC     Status: None   Collection Time    09/26/13  5:00 AM      Result Value Ref Range   WBC 6.8  4.0 - 10.5 K/uL   RBC 4.52  3.87 - 5.11 MIL/uL   Hemoglobin 13.6  12.0 - 15.0 g/dL   HCT 39.9  36.0 - 46.0 %   MCV 88.3  78.0 - 100.0 fL   MCH 30.1  26.0 - 34.0 pg   MCHC 34.1  30.0 - 36.0 g/dL   RDW 14.0  11.5 - 15.5 %   Platelets 183  150 - 400 K/uL  BASIC METABOLIC PANEL     Status: Abnormal   Collection Time    09/26/13  5:00 AM      Result Value Ref Range   Sodium 137  137 - 147 mEq/L   Potassium 3.6 (*) 3.7 - 5.3 mEq/L   Chloride 99  96 - 112 mEq/L   CO2 26  19 - 32 mEq/L   Glucose, Bld 190 (*) 70 - 99 mg/dL   BUN 8  6 - 23 mg/dL   Creatinine, Ser 0.35 (*) 0.50 - 1.10 mg/dL   Calcium 8.4  8.4 - 10.5 mg/dL   GFR calc non Af Amer >90  >90 mL/min   GFR calc Af Amer >90  >90 mL/min   Comment: (NOTE)     The eGFR has been calculated using the CKD EPI equation.     This calculation has not been validated in all clinical situations.     eGFR's persistently <90  mL/min signify possible Chronic Kidney     Disease.  GLUCOSE, CAPILLARY     Status: Abnormal   Collection Time    09/26/13  6:45 AM      Result Value Ref Range   Glucose-Capillary 168 (*) 70 - 99 mg/dL  VANCOMYCIN, TROUGH     Status: Abnormal   Collection Time    09/26/13  8:35 AM      Result Value Ref Range   Vancomycin Tr 8.1 (*) 10.0 - 20.0 ug/mL  GLUCOSE, CAPILLARY     Status: Abnormal   Collection Time    09/26/13 11:35 AM      Result Value Ref Range   Glucose-Capillary 193 (*) 70 - 99 mg/dL  GLUCOSE, CAPILLARY     Status: Abnormal   Collection Time    09/26/13  4:29 PM      Result Value Ref Range   Glucose-Capillary 171 (*) 70 - 99  mg/dL   Comment 1 Notify RN     Comment 2 Documented in Chart      Mr Foot Right W Wo Contrast  09/25/2013   CLINICAL DATA:  Osteomyelitis.  Medial ankle ulcer  EXAM: MRI OF THE RIGHT FOREFOOT WITHOUT AND WITH CONTRAST  TECHNIQUE: Multiplanar, multisequence MR imaging was performed both before and after administration of intravenous contrast.  CONTRAST:  48m MULTIHANCE GADOBENATE DIMEGLUMINE 529 MG/ML IV SOLN  COMPARISON:  CT scan 09/23/2013  FINDINGS: As demonstrated on the CT scan there is a medial ankle ulceration which appears to grow right down to the bone. The underlying cortex was abnormal on the CT scan and there is mild edema and enhancement on the MRI consistent with cortical and subcortical osteomyelitis. No focal drainable soft tissue abscess. There is diffuse cellulitis and myositis but no findings for pyomyositis or septic arthritis.  The medial and lateral ankle tendons are intact. The posterior tibialis tendon is running right through the infected area and is very attenuated. The Achilles tendon and plantar fascia are intact. Moderate ankle, midfoot and hindfoot degenerative changes.  IMPRESSION: 1. 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 2. Cellulitis and myofasciitis but no findings for discrete drainable abscess or pyomyositis. 3. The posterior tibialis tendon is likely involved with the infection and is very attenuated.   Electronically Signed   By: MKalman JewelsM.D.   On: 09/25/2013 16:52   Dg Chest Port 1 View  09/25/2013   CLINICAL DATA:  Right PICC line  EXAM: PORTABLE CHEST - 1 VIEW  COMPARISON:  09/23/2013  FINDINGS: Cardiomediastinal silhouette is stable. Right arm PICC line in place with tip distal SVC. No pneumothorax. No acute infiltrate or pulmonary edema.  IMPRESSION: Right PICC line in place.  No pneumothorax.   Electronically Signed   By: LLahoma CrockerM.D.   On: 09/25/2013 18:12    Review of  Systems  All other systems reviewed and are negative.   Blood pressure 164/74, pulse 73, temperature 98.6 F (37 C), temperature source Oral, resp. rate 18, height _0  (1.575 m), weight 106.55 kg (234 lb 14.4 oz), SpO2 100.00%. Physical Exam On examination patient has significant venous stasis insufficiency bilateral lower extremities with brawny skin color changes there is venous stasis ulceration to left lower extremity and a large venous stasis ulcer over the medial malleolus right lower extremity. The ulcer is approximately 4 cm in diameter there is good beefy granulation tissue at the base of  the wound. MRI scan shows some edema of the medial malleolus but this is not definitive for osteomyelitis. There is ischemic changes to the right second toe.   Assessment/Plan: Assessment: Peripheral vascular disease with venous stasis ulceration bilateral lower extremities with a large medial malleolar ulcer on the right with ischemic changes to the second toe on the right.  Plan: Patient is currently scheduled for revascularization to the right lower extremity. I will follow her up postoperatively with wound care. We'll obtain venous duplex for her lower extremities. Patient may be a candidate for the compression sock study pending the results of her revascularization and the results of the venous duplex.  DUDA,MARCUS V 09/26/2013, 7:23 PM

## 2013-09-27 ENCOUNTER — Inpatient Hospital Stay (HOSPITAL_COMMUNITY): Payer: BC Managed Care – PPO | Admitting: Anesthesiology

## 2013-09-27 ENCOUNTER — Encounter (HOSPITAL_COMMUNITY): Payer: BC Managed Care – PPO | Admitting: Anesthesiology

## 2013-09-27 ENCOUNTER — Encounter (HOSPITAL_COMMUNITY): Payer: Self-pay | Admitting: Certified Registered"

## 2013-09-27 ENCOUNTER — Encounter (HOSPITAL_COMMUNITY)
Admission: EM | Disposition: A | Discharge status: Home Health | Payer: Self-pay | Source: Home / Self Care | Attending: Internal Medicine

## 2013-09-27 HISTORY — PX: FEMORAL-TIBIAL BYPASS GRAFT: SHX938

## 2013-09-27 LAB — CBC
HCT: 40 % (ref 36.0–46.0)
Hemoglobin: 13.6 g/dL (ref 12.0–15.0)
MCH: 30 pg (ref 26.0–34.0)
MCHC: 34 g/dL (ref 30.0–36.0)
MCV: 88.3 fL (ref 78.0–100.0)
PLATELETS: 181 10*3/uL (ref 150–400)
RBC: 4.53 MIL/uL (ref 3.87–5.11)
RDW: 13.9 % (ref 11.5–15.5)
WBC: 5.5 10*3/uL (ref 4.0–10.5)

## 2013-09-27 LAB — BASIC METABOLIC PANEL
BUN: 9 mg/dL (ref 6–23)
CALCIUM: 8.1 mg/dL — AB (ref 8.4–10.5)
CO2: 27 meq/L (ref 19–32)
CREATININE: 0.35 mg/dL — AB (ref 0.50–1.10)
Chloride: 102 mEq/L (ref 96–112)
GFR calc Af Amer: >90 mL/min (ref >90)
GFR calc non Af Amer: >90 mL/min (ref >90)
GLUCOSE: 185 mg/dL — AB (ref 70–99)
Potassium: 3.4 mEq/L — ABNORMAL LOW (ref 3.7–5.3)
Sodium: 141 mEq/L (ref 137–147)

## 2013-09-27 LAB — GLUCOSE, CAPILLARY
GLUCOSE-CAPILLARY: 173 mg/dL — AB (ref 70–99)
GLUCOSE-CAPILLARY: 190 mg/dL — AB (ref 70–99)
GLUCOSE-CAPILLARY: 181 mg/dL — AB (ref 70–99)
Glucose-Capillary: 190 mg/dL — ABNORMAL HIGH (ref 70–99)

## 2013-09-27 SURGERY — CREATION, BYPASS, ARTERIAL, FEMORAL TO TIBIAL, USING GRAFT
Anesthesia: General | Site: Leg Lower | Laterality: Right

## 2013-09-27 MED ORDER — HYDROMORPHONE HCL PF 1 MG/ML IJ SOLN
0.2500 mg | INTRAMUSCULAR | Status: DC | PRN
  Administered 2013-09-27 (×4): 0.5 mg via INTRAVENOUS

## 2013-09-27 MED ORDER — HEPARIN SODIUM (PORCINE) 1000 UNIT/ML IJ SOLN
INTRAMUSCULAR | Status: DC | PRN
  Administered 2013-09-27: 1000 [IU] via INTRAVENOUS
  Administered 2013-09-27: 8000 [IU] via INTRAVENOUS

## 2013-09-27 MED ORDER — ASPIRIN EC 325 MG PO TBEC
325.0000 mg | DELAYED_RELEASE_TABLET | Freq: Every day | ORAL | Status: DC
  Administered 2013-09-28 – 2013-10-03 (×6): 325 mg via ORAL
  Filled 2013-09-27 (×9): qty 1

## 2013-09-27 MED ORDER — GUAIFENESIN-DM 100-10 MG/5ML PO SYRP
15.0000 mL | ORAL_SOLUTION | ORAL | Status: DC | PRN
Start: 2013-09-27 — End: 2013-10-03

## 2013-09-27 MED ORDER — FENTANYL CITRATE 0.05 MG/ML IJ SOLN
INTRAMUSCULAR | Status: AC
  Filled 2013-09-27: qty 5

## 2013-09-27 MED ORDER — PROPOFOL 10 MG/ML IV BOLUS
INTRAVENOUS | Status: DC | PRN
Start: 2013-09-27 — End: 2013-09-27
  Administered 2013-09-27: 160 mg via INTRAVENOUS
  Administered 2013-09-27 (×2): 20 mg via INTRAVENOUS

## 2013-09-27 MED ORDER — GLYCOPYRROLATE 0.2 MG/ML IJ SOLN
INTRAMUSCULAR | Status: DC | PRN
  Administered 2013-09-27: 0.6 mg via INTRAVENOUS

## 2013-09-27 MED ORDER — PROPOFOL 10 MG/ML IV BOLUS
INTRAVENOUS | Status: AC
  Filled 2013-09-27: qty 20

## 2013-09-27 MED ORDER — LACTATED RINGERS IV SOLN: INTRAVENOUS | Status: DC

## 2013-09-27 MED ORDER — VECURONIUM BROMIDE 10 MG IV SOLR
INTRAVENOUS | Status: DC | PRN
  Administered 2013-09-27: 1 mg via INTRAVENOUS
  Administered 2013-09-27: 2 mg via INTRAVENOUS
  Administered 2013-09-27 (×4): 1 mg via INTRAVENOUS
  Administered 2013-09-27: 2 mg via INTRAVENOUS

## 2013-09-27 MED ORDER — ROCURONIUM BROMIDE 50 MG/5ML IV SOLN
INTRAVENOUS | Status: AC
  Filled 2013-09-27: qty 1

## 2013-09-27 MED ORDER — MIDAZOLAM HCL 5 MG/5ML IJ SOLN
INTRAMUSCULAR | Status: DC | PRN
Start: 2013-09-27 — End: 2013-09-27
  Administered 2013-09-27: 2 mg via INTRAVENOUS

## 2013-09-27 MED ORDER — LABETALOL HCL 5 MG/ML IV SOLN
10.0000 mg | INTRAVENOUS | Status: DC | PRN
  Filled 2013-09-27: qty 4

## 2013-09-27 MED ORDER — EPHEDRINE SULFATE 50 MG/ML IJ SOLN
INTRAMUSCULAR | Status: DC | PRN
  Administered 2013-09-27: 10 mg via INTRAVENOUS

## 2013-09-27 MED ORDER — 0.9 % SODIUM CHLORIDE (POUR BTL) OPTIME
TOPICAL | Status: DC | PRN
  Administered 2013-09-27: 2000 mL

## 2013-09-27 MED ORDER — ONDANSETRON HCL 4 MG/2ML IJ SOLN
INTRAMUSCULAR | Status: DC | PRN
  Administered 2013-09-27: 4 mg via INTRAVENOUS

## 2013-09-27 MED ORDER — NEOSTIGMINE METHYLSULFATE 1 MG/ML IJ SOLN
INTRAMUSCULAR | Status: DC | PRN
  Administered 2013-09-27: 4 mg via INTRAVENOUS

## 2013-09-27 MED ORDER — ROCURONIUM BROMIDE 100 MG/10ML IV SOLN
INTRAVENOUS | Status: DC | PRN
Start: 2013-09-27 — End: 2013-09-27
  Administered 2013-09-27 (×5): 10 mg via INTRAVENOUS
  Administered 2013-09-27: 50 mg via INTRAVENOUS

## 2013-09-27 MED ORDER — PROTAMINE SULFATE 10 MG/ML IV SOLN
INTRAVENOUS | Status: DC | PRN
  Administered 2013-09-27: 50 mg via INTRAVENOUS

## 2013-09-27 MED ORDER — LIDOCAINE HCL (CARDIAC) 20 MG/ML IV SOLN
INTRAVENOUS | Status: AC
  Filled 2013-09-27: qty 5

## 2013-09-27 MED ORDER — DOCUSATE SODIUM 100 MG PO CAPS
100.0000 mg | ORAL_CAPSULE | Freq: Every day | ORAL | Status: DC
  Administered 2013-09-28 – 2013-10-03 (×6): 100 mg via ORAL
  Filled 2013-09-27 (×6): qty 1

## 2013-09-27 MED ORDER — OXYCODONE HCL 5 MG/5ML PO SOLN: 5.0000 mg | Freq: Once | ORAL | Status: AC | PRN

## 2013-09-27 MED ORDER — PANTOPRAZOLE SODIUM 40 MG PO TBEC
40.0000 mg | DELAYED_RELEASE_TABLET | Freq: Every day | ORAL | Status: DC
  Administered 2013-09-28 – 2013-10-03 (×6): 40 mg via ORAL
  Filled 2013-09-27 (×5): qty 1

## 2013-09-27 MED ORDER — HEMOSTATIC AGENTS (NO CHARGE) OPTIME
TOPICAL | Status: DC | PRN
  Administered 2013-09-27: 1 via TOPICAL

## 2013-09-27 MED ORDER — PHENYLEPHRINE HCL 10 MG/ML IJ SOLN
INTRAMUSCULAR | Status: DC | PRN
  Administered 2013-09-27 (×4): 80 ug via INTRAVENOUS

## 2013-09-27 MED ORDER — HYDROMORPHONE HCL PF 1 MG/ML IJ SOLN
INTRAMUSCULAR | Status: AC
  Filled 2013-09-27: qty 1

## 2013-09-27 MED ORDER — PHENOL 1.4 % MT LIQD: 1.0000 | OROMUCOSAL | Status: DC | PRN

## 2013-09-27 MED ORDER — VECURONIUM BROMIDE 10 MG IV SOLR
INTRAVENOUS | Status: AC
  Filled 2013-09-27: qty 10

## 2013-09-27 MED ORDER — DOPAMINE-DEXTROSE 3.2-5 MG/ML-% IV SOLN: 3.0000 ug/kg/min | INTRAVENOUS | Status: DC

## 2013-09-27 MED ORDER — OXYCODONE HCL 5 MG PO TABS
5.0000 mg | ORAL_TABLET | Freq: Once | ORAL | Status: AC | PRN
  Administered 2013-09-27: 5 mg via ORAL

## 2013-09-27 MED ORDER — FENTANYL CITRATE 0.05 MG/ML IJ SOLN
INTRAMUSCULAR | Status: DC | PRN
  Administered 2013-09-27 (×4): 50 ug via INTRAVENOUS
  Administered 2013-09-27: 100 ug via INTRAVENOUS
  Administered 2013-09-27 (×6): 50 ug via INTRAVENOUS
  Administered 2013-09-27: 100 ug via INTRAVENOUS
  Administered 2013-09-27 (×3): 50 ug via INTRAVENOUS
  Administered 2013-09-27: 100 ug via INTRAVENOUS
  Administered 2013-09-27: 50 ug via INTRAVENOUS

## 2013-09-27 MED ORDER — SODIUM CHLORIDE 0.9 % IR SOLN
Status: DC | PRN
  Administered 2013-09-27 (×2)

## 2013-09-27 MED ORDER — MIDAZOLAM HCL 2 MG/2ML IJ SOLN
INTRAMUSCULAR | Status: AC
  Filled 2013-09-27: qty 2

## 2013-09-27 MED ORDER — ALUM & MAG HYDROXIDE-SIMETH 200-200-20 MG/5ML PO SUSP: 15.0000 mL | ORAL | Status: DC | PRN

## 2013-09-27 MED ORDER — VANCOMYCIN HCL 1000 MG IV SOLR
1000.0000 mg | INTRAVENOUS | Status: DC | PRN
  Administered 2013-09-27: 1000 mg via INTRAVENOUS

## 2013-09-27 MED ORDER — LACTATED RINGERS IV SOLN
INTRAVENOUS | Status: DC
  Administered 2013-09-27 (×3): via INTRAVENOUS

## 2013-09-27 MED ORDER — LIDOCAINE HCL (CARDIAC) 20 MG/ML IV SOLN
INTRAVENOUS | Status: DC | PRN
Start: 2013-09-27 — End: 2013-09-27
  Administered 2013-09-27: 40 mg via INTRAVENOUS

## 2013-09-27 MED ORDER — POTASSIUM CHLORIDE CRYS ER 20 MEQ PO TBCR: 20.0000 meq | EXTENDED_RELEASE_TABLET | Freq: Every day | ORAL | Status: DC | PRN

## 2013-09-27 MED ORDER — HYDRALAZINE HCL 20 MG/ML IJ SOLN: 10.0000 mg | INTRAMUSCULAR | Status: DC | PRN

## 2013-09-27 MED ORDER — SODIUM CHLORIDE 0.9 % IV SOLN
500.0000 mL | Freq: Once | INTRAVENOUS | Status: AC | PRN
  Administered 2013-09-27 (×2): 500 mL via INTRAVENOUS

## 2013-09-27 MED ORDER — OXYCODONE HCL 5 MG PO TABS
ORAL_TABLET | ORAL | Status: AC
  Filled 2013-09-27: qty 1

## 2013-09-27 MED ORDER — SODIUM CHLORIDE 0.9 % IV SOLN: INTRAVENOUS | Status: DC

## 2013-09-27 MED ORDER — OXYCODONE HCL 5 MG PO TABS
5.0000 mg | ORAL_TABLET | ORAL | Status: DC | PRN
  Administered 2013-09-28: 5 mg via ORAL
  Administered 2013-09-28 – 2013-09-30 (×8): 10 mg via ORAL
  Administered 2013-10-01: 5 mg via ORAL
  Administered 2013-10-01 – 2013-10-03 (×4): 10 mg via ORAL
  Administered 2013-10-03: 5 mg via ORAL
  Filled 2013-09-27 (×3): qty 2
  Filled 2013-09-27: qty 1
  Filled 2013-09-27 (×4): qty 2
  Filled 2013-09-27: qty 1
  Filled 2013-09-27 (×6): qty 2

## 2013-09-27 MED ORDER — METOCLOPRAMIDE HCL 5 MG/ML IJ SOLN: 10.0000 mg | Freq: Once | INTRAMUSCULAR | Status: DC | PRN

## 2013-09-27 MED ORDER — METOPROLOL TARTRATE 1 MG/ML IV SOLN: 2.0000 mg | INTRAVENOUS | Status: DC | PRN

## 2013-09-27 SURGICAL SUPPLY — 76 items
ADH SKN CLS APL DERMABOND .7 (GAUZE/BANDAGES/DRESSINGS) ×1
BAG BANDED W/RUBBER/TAPE 36X54 (MISCELLANEOUS) ×2 IMPLANT
BAG EQP BAND 135X91 W/RBR TAPE (MISCELLANEOUS) ×1
BANDAGE ELASTIC 4 VELCRO ST LF (GAUZE/BANDAGES/DRESSINGS) IMPLANT
BANDAGE ESMARK 6X9 LF (GAUZE/BANDAGES/DRESSINGS) IMPLANT
BNDG CMPR 9X6 STRL LF SNTH (GAUZE/BANDAGES/DRESSINGS) ×1
BNDG ESMARK 6X9 LF (GAUZE/BANDAGES/DRESSINGS) ×3
BNDG GAUZE ELAST 4 BULKY (GAUZE/BANDAGES/DRESSINGS) ×2 IMPLANT
CANISTER SUCTION 2500CC (MISCELLANEOUS) ×3 IMPLANT
CANISTER WOUND CARE 500ML ATS (WOUND CARE) ×2 IMPLANT
CLIP TI MEDIUM 24 (CLIP) ×3 IMPLANT
CLIP TI WIDE RED SMALL 24 (CLIP) ×5 IMPLANT
COVER PROBE W GEL 5X96 (DRAPES) ×2 IMPLANT
COVER SURGICAL LIGHT HANDLE (MISCELLANEOUS) ×3 IMPLANT
CUFF TOURNIQUET SINGLE 24IN (TOURNIQUET CUFF) IMPLANT
CUFF TOURNIQUET SINGLE 34IN LL (TOURNIQUET CUFF) ×2 IMPLANT
CUFF TOURNIQUET SINGLE 44IN (TOURNIQUET CUFF) IMPLANT
DERMABOND ADVANCED (GAUZE/BANDAGES/DRESSINGS) ×2
DERMABOND ADVANCED .7 DNX12 (GAUZE/BANDAGES/DRESSINGS) ×1 IMPLANT
DRAIN CHANNEL 15F RND FF W/TCR (WOUND CARE) ×2 IMPLANT
DRAPE WARM FLUID 44X44 (DRAPE) ×3 IMPLANT
DRAPE X-RAY CASS 24X20 (DRAPES) IMPLANT
DRSG AQUACEL AG ADV 3.5X10 (GAUZE/BANDAGES/DRESSINGS) ×2 IMPLANT
DRSG COVADERM 4X10 (GAUZE/BANDAGES/DRESSINGS) IMPLANT
DRSG COVADERM 4X8 (GAUZE/BANDAGES/DRESSINGS) IMPLANT
DRSG VAC ATS SM SENSATRAC (GAUZE/BANDAGES/DRESSINGS) ×2 IMPLANT
ELECT REM PT RETURN 9FT ADLT (ELECTROSURGICAL) ×3
ELECTRODE REM PT RTRN 9FT ADLT (ELECTROSURGICAL) ×1 IMPLANT
EVACUATOR SILICONE 100CC (DRAIN) ×2 IMPLANT
GLOVE BIO SURGEON STRL SZ 6 (GLOVE) ×2 IMPLANT
GLOVE BIO SURGEON STRL SZ 6.5 (GLOVE) ×2 IMPLANT
GLOVE BIO SURGEON STRL SZ7.5 (GLOVE) ×2 IMPLANT
GLOVE BIO SURGEONS STRL SZ 6.5 (GLOVE) ×2
GLOVE BIOGEL PI IND STRL 6 (GLOVE) IMPLANT
GLOVE BIOGEL PI IND STRL 7.5 (GLOVE) ×1 IMPLANT
GLOVE BIOGEL PI INDICATOR 6 (GLOVE) ×2
GLOVE BIOGEL PI INDICATOR 7.5 (GLOVE) ×4
GLOVE SURG SS PI 7.5 STRL IVOR (GLOVE) ×3 IMPLANT
GOWN STRL REUS W/ TWL LRG LVL3 (GOWN DISPOSABLE) ×2 IMPLANT
GOWN STRL REUS W/ TWL XL LVL3 (GOWN DISPOSABLE) ×1 IMPLANT
GOWN STRL REUS W/TWL LRG LVL3 (GOWN DISPOSABLE) ×6
GOWN STRL REUS W/TWL XL LVL3 (GOWN DISPOSABLE) ×3
HEMOSTAT SNOW SURGICEL 2X4 (HEMOSTASIS) ×2 IMPLANT
KIT BASIN OR (CUSTOM PROCEDURE TRAY) ×3 IMPLANT
KIT ROOM TURNOVER OR (KITS) ×3 IMPLANT
MARKER GRAFT CORONARY BYPASS (MISCELLANEOUS) IMPLANT
NS IRRIG 1000ML POUR BTL (IV SOLUTION) ×6 IMPLANT
PACK PERIPHERAL VASCULAR (CUSTOM PROCEDURE TRAY) ×3 IMPLANT
PAD ARMBOARD 7.5X6 YLW CONV (MISCELLANEOUS) ×6 IMPLANT
PADDING CAST COTTON 6X4 STRL (CAST SUPPLIES) IMPLANT
SET COLLECT BLD 21X3/4 12 (NEEDLE) IMPLANT
SPONGE LAP 18X18 X RAY DECT (DISPOSABLE) ×10 IMPLANT
STOPCOCK 4 WAY LG BORE MALE ST (IV SETS) IMPLANT
SUT ETHILON 3 0 PS 1 (SUTURE) IMPLANT
SUT PROLENE 5 0 C 1 24 (SUTURE) ×5 IMPLANT
SUT PROLENE 5 0 C 1 36 (SUTURE) ×10 IMPLANT
SUT PROLENE 6 0 BV (SUTURE) ×13 IMPLANT
SUT PROLENE 7 0 BV 1 (SUTURE) ×2 IMPLANT
SUT PROLENE 7 0 BV1 MDA (SUTURE) ×4 IMPLANT
SUT SILK 2 0 (SUTURE) ×3
SUT SILK 2 0 FS (SUTURE) ×2 IMPLANT
SUT SILK 2 0 SH (SUTURE) ×5 IMPLANT
SUT SILK 2-0 18XBRD TIE 12 (SUTURE) IMPLANT
SUT SILK 3 0 (SUTURE) ×6
SUT SILK 3-0 18XBRD TIE 12 (SUTURE) IMPLANT
SUT VIC AB 2-0 CT1 27 (SUTURE) ×9
SUT VIC AB 2-0 CT1 TAPERPNT 27 (SUTURE) ×2 IMPLANT
SUT VIC AB 3-0 SH 27 (SUTURE) ×15
SUT VIC AB 3-0 SH 27X BRD (SUTURE) ×2 IMPLANT
SUT VICRYL 4-0 PS2 18IN ABS (SUTURE) ×8 IMPLANT
TOWEL OR 17X24 6PK STRL BLUE (TOWEL DISPOSABLE) ×6 IMPLANT
TOWEL OR 17X26 10 PK STRL BLUE (TOWEL DISPOSABLE) ×6 IMPLANT
TRAY FOLEY CATH 16FRSI W/METER (SET/KITS/TRAYS/PACK) ×3 IMPLANT
TUBING EXTENTION W/L.L. (IV SETS) IMPLANT
UNDERPAD 30X30 INCONTINENT (UNDERPADS AND DIAPERS) ×3 IMPLANT
WATER STERILE IRR 1000ML POUR (IV SOLUTION) ×3 IMPLANT

## 2013-09-27 NOTE — Brief Op Note (Signed)
09/23/2013 - 09/27/2013  9:11 PM  PATIENT:  [name]  58 y.o. female  PRE-OPERATIVE DIAGNOSIS:  PERIPHERAL VASCULAR DISEASE WITH NONHEALING ULCER RIGHT FOOT  POST-OPERATIVE DIAGNOSIS:  PERIPHERAL VASCULAR DISEASE WITH NONHEALING  PROCEDURE:  Procedure(s): BYPASS GRAFT FEMORAL-TIBIAL ARTERY-RIGHT (Right)  SURGEON:  Surgeon(s) and Role:    * Nada LibmanVance W Leary Mcnulty, MD - Primary  PHYSICIAN ASSISTANT:   ASSISTANTS: Alyssa GroveS. Rhyne, PA   ANESTHESIA:   general  EBL:  Total I/O In: -  Out: 125 [Urine:75; Drains:50]  BLOOD ADMINISTERED:none  DRAINS: (15) Blake drain(s) in the vein harvest site   LOCAL MEDICATIONS USED:  Amount: 0 ml  SPECIMEN:  No Specimen  DISPOSITION OF SPECIMEN:  N/A  COUNTS:  YES  TOURNIQUET:   Total Tourniquet Time Documented: Thigh (Right) - 26 minutes Total: Thigh (Right) - 26 minutes   DICTATION: .Reubin Milanragon Dictation  PLAN OF CARE: Admit to inpatient   PATIENT DISPOSITION:  PACU - hemodynamically stable.   Delay start of Pharmacological VTE agent (>24hrs) due to surgical blood loss or risk of bleeding: yes

## 2013-09-27 NOTE — Anesthesia Postprocedure Evaluation (Signed)
  Anesthesia Post-op Note  Patient: [name]  Procedure(s) Performed: Procedure(s): BYPASS GRAFT FEMORAL-TIBIAL ARTERY-RIGHT (Right)  Patient Location: PACU  Anesthesia Type:General  Level of Consciousness: awake  Airway and Oxygen Therapy: Patient Spontanous Breathing  Post-op Pain: mild  Post-op Assessment: Post-op Vital signs reviewed  Post-op Vital Signs: Reviewed  Last Vitals:  Filed Vitals:   09/27/13 1900  BP: 92/46  Pulse: 82  Temp:   Resp: 20    Complications: No apparent anesthesia complications

## 2013-09-27 NOTE — Transfer of Care (Signed)
Immediate Anesthesia Transfer of Care Note  Patient: Kaitlin RainsCheryl C Gartrell  Procedure(s) Performed: Procedure(s): BYPASS GRAFT FEMORAL-TIBIAL ARTERY-RIGHT (Right)  Patient Location: PACU  Anesthesia Type:General  Level of Consciousness: awake, alert , oriented and patient cooperative  Airway & Oxygen Therapy: Patient Spontanous Breathing and Patient connected to nasal cannula oxygen  Post-op Assessment: Report given to PACU RN and Post -op Vital signs reviewed and stable  Post vital signs: Reviewed and stable  Complications: No apparent anesthesia complications

## 2013-09-27 NOTE — Progress Notes (Signed)
Subjective  -   The patient has bilateral venous stasis ulcers and cellulitis on her left leg.  An MRI shows possible osteomyelitis beneath the right medial malleolar ulcer.  She has been seen previously by Dr. early and Scot Dock and have recommended right leg revascularization.   Physical Exam:  Bilateral venous stasis ulcers.  Cellulitis on the left leg.  Gangrenous right second toe.       Assessment/Plan:    I met the patient this morning in the holding area.  I discussed proceeding with a right tibial bypass, using ipsilateral saphenous vein.  We discussed the details of the procedure, the incisions, and the postoperative care.  She understands that without revascularization she is at high risk for limb loss.  She is eager to proceed.  Robby Pirani IV, V. WELLS 09/27/2013 10:19 AM --  Filed Vitals:   09/27/13 0505  BP: 160/57  Pulse: 72  Temp: 98.1 F (36.7 C)  Resp: 18    Intake/Output Summary (Last 24 hours) at 09/27/13 1019 Last data filed at 09/27/13 0819  Gross per 24 hour  Intake    360 ml  Output   1701 ml  Net  -1341 ml     Laboratory CBC    Component Value Date/Time   WBC 5.5 09/27/2013 0508   HGB 13.6 09/27/2013 0508   HCT 40.0 09/27/2013 0508   PLT 181 09/27/2013 0508    BMET    Component Value Date/Time   NA 141 09/27/2013 0508   K 3.4* 09/27/2013 0508   CL 102 09/27/2013 0508   CO2 27 09/27/2013 0508   GLUCOSE 185* 09/27/2013 0508   BUN 9 09/27/2013 0508   CREATININE 0.35* 09/27/2013 0508   CREATININE 0.44* 07/22/2013 1612   CALCIUM 8.1* 09/27/2013 0508   GFRNONAA >90 09/27/2013 0508   GFRAA >90 09/27/2013 0508    COAG No results found for this basename: INR, PROTIME   No results found for this basename: PTT    Antibiotics Anti-infectives   Start     Dose/Rate Route Frequency Ordered Stop   09/26/13 2200  [MAR Hold]  vancomycin (VANCOCIN) 1,500 mg in sodium chloride 0.9 % 500 mL IVPB     (On MAR Hold since 09/27/13 1003)   1,500 mg 250 mL/hr over 120  Minutes Intravenous Every 12 hours 09/26/13 1209     09/26/13 1215  vancomycin (VANCOCIN) 1,500 mg in sodium chloride 0.9 % 500 mL IVPB     1,500 mg 250 mL/hr over 120 Minutes Intravenous NOW 09/26/13 1209 09/26/13 1516   09/24/13 0900  vancomycin (VANCOCIN) IVPB 1000 mg/200 mL premix  Status:  Discontinued     1,000 mg 200 mL/hr over 60 Minutes Intravenous Every 12 hours 09/23/13 1914 09/26/13 1209   09/23/13 2200  oseltamivir (TAMIFLU) capsule 75 mg  Status:  Discontinued     75 mg Oral 2 times daily 09/23/13 1839 09/23/13 2105   09/23/13 2000  [MAR Hold]  ceFEPIme (MAXIPIME) 2 g in dextrose 5 % 50 mL IVPB     (On MAR Hold since 09/27/13 1003)   2 g 100 mL/hr over 30 Minutes Intravenous Every 12 hours 09/23/13 1914     09/23/13 2000  vancomycin (VANCOCIN) IVPB 1000 mg/200 mL premix     1,000 mg 200 mL/hr over 60 Minutes Intravenous Every 1 hr x 2 09/23/13 1914 09/23/13 2323       V. Leia Alf, M.D. [facility] Office: [phone]  Pager:  [phone]

## 2013-09-27 NOTE — Anesthesia Procedure Notes (Signed)
Procedure Name: Intubation Date/Time: 09/27/2013 11:18 AM Performed by: Charm BargesBUTLER, Momo Braun R Pre-anesthesia Checklist: Patient identified, Emergency Drugs available, Suction available, Patient being monitored and Timeout performed Patient Re-evaluated:Patient Re-evaluated prior to inductionOxygen Delivery Method: Circle system utilized Preoxygenation: Pre-oxygenation with 100% oxygen Intubation Type: IV induction Ventilation: Mask ventilation without difficulty Laryngoscope Size: 3 Grade View: Grade I Tube size: 7.5 mm Number of attempts: 1 Airway Equipment and Method: Stylet Placement Confirmation: ETT inserted through vocal cords under direct vision,  positive ETCO2 and breath sounds checked- equal and bilateral Secured at: 21 cm Tube secured with: Tape Dental Injury: Teeth and Oropharynx as per pre-operative assessment

## 2013-09-27 NOTE — Anesthesia Preprocedure Evaluation (Signed)
Anesthesia Evaluation  Patient identified by MRN, date of birth, ID band Patient awake    Reviewed: Allergy & Precautions, H&P , NPO status , Patient's Chart, lab work & pertinent test results, reviewed documented beta blocker date and time   Airway Mallampati: II TM Distance: >3 FB Neck ROM: full    Dental   Pulmonary Current Smoker,  breath sounds clear to auscultation        Cardiovascular hypertension, On Medications + CAD, + Cardiac Stents and + Peripheral Vascular Disease Rhythm:regular     Neuro/Psych negative neurological ROS  negative psych ROS   GI/Hepatic negative GI ROS, Neg liver ROS,   Endo/Other  diabetes, Insulin Dependent, Oral Hypoglycemic AgentsMorbid obesity  Renal/GU negative Renal ROS  negative genitourinary   Musculoskeletal   Abdominal   Peds  Hematology negative hematology ROS (+) Blood dyscrasia, ,   Anesthesia Other Findings See surgeon's H&P   Reproductive/Obstetrics negative OB ROS                           Anesthesia Physical Anesthesia Plan  ASA: III  Anesthesia Plan: General   Post-op Pain Management:    Induction: Intravenous  Airway Management Planned: Oral ETT  Additional Equipment:   Intra-op Plan:   Post-operative Plan: Extubation in OR  Informed Consent: I have reviewed the patients History and Physical, chart, labs and discussed the procedure including the risks, benefits and alternatives for the proposed anesthesia with the patient or authorized representative who has indicated his/her understanding and acceptance.   Dental Advisory Given  Plan Discussed with: CRNA and Surgeon  Anesthesia Plan Comments:         Anesthesia Quick Evaluation

## 2013-09-27 NOTE — Progress Notes (Signed)
PROGRESS NOTE  [name] MOQ:[id number] DOB: 04-27-1956 DOA: 09/23/2013 PCP: Leonides Grills, MD  Interim history  58 year old female with a history of diabetes mellitus, hypertension, peripheral vascular disease with a nonhealing right medial malleoli wound presented to the ED at [facility] after the patient's son noted her to be more confused on the day of admission. The patient had been going to the wound care center at [facility], but her right malleolus wound has not made significant progress. The patient had a right common iliac angioplasty and stent placed on 08/15/2013 by Dr. Scot Dock. The patient has not been on any antibiotics recently. In the ED, the patient was noted to be febrile with temperature 100.32F, heart rate 120s, WBC 19.1. She was transferred to [facility] for definitive care vascular surgery as well as antibiotics for her wound. MRI of the right lower extremity showed osteomyelitis of the right ankle with myositis. The patient was continued on vancomycin and cefepime. WBC count has improved to 5.5. The patient has defervescence. Orthopedic surgery was consulted, but there are no definitive plans for debridement at this time. A PICC line was placed with the intention of 6 weeks of intravenous antibiotics.   Assessment/Plan: Sepsis  -improved  -Secondary to diabetic foot infection on the right  -Chest x-ray and urinalysis are negative  -Continue empiric vancomycin and cefepime  -Blood cultures remain negative  Diabetic foot infection with presumptive right ankle osteomyelitis  -positive probe to bone test on exam -MRI foot--shows osteomyelitis, myositis  -Continue antibiotics  -appreciate wound care nurse  -ESR--70  -placed PICC  -consulted Dr. Sharol Given for debridement and possible bone biosy  Atherosclerosis of native arteries of the extremities with ulceration, PVD  -consulted VVS--spoke with Dr. Donnetta Hutching  -revascularization planned 09/27/13 Tobacco abuse  -This is  contributing to her poor wound healing  -Tobacco cessation discussed  Hypertension  -Continue HCTZ and lisinopril  DM, with complications of neuropathy and vasculopathy  -Hemoglobin A1c 7.6  -Continue Lantus 10 units at bedtime  -Start NovoLog 3 units with each meal  -NovoLog sliding-scale  Hyperlipidemia  -Continue statin  Family Communication: Sister at beside  Disposition Plan: Home when medically stable  Procedures/Studies:  CXR 09/23/2013 No active cardiopulmonary disease.  Right ankle XRAY 09/23/2013 Soft tissue changes without acute bony abnormality.  Right foot CT 09/23/2013 Diffuse prominent soft tissue swelling is present. Soft tissue ulceration over the medial malleolus appears to be present. Bony erosion in the region of the medial malleolus cannot be excluded and osteomyelitis suspected. Confirmation with MRI and/or triphasic bone scan can be obtained. Antibiotics:  Vancomycin 4/3 -->  Maxipime 4/3 -->    Procedures/Studies: Dg Chest 2 View  09/23/2013   CLINICAL DATA:  Fever and weakness  EXAM: CHEST  2 VIEW  COMPARISON:  07/10/2011  FINDINGS: The heart size and mediastinal contours are within normal limits. Both lungs are clear. The visualized skeletal structures are unremarkable.  IMPRESSION: No active cardiopulmonary disease.   Electronically Signed   By: Inez Catalina M.D.   On: 09/23/2013 17:19   Dg Ankle Complete Right  09/23/2013   CLINICAL DATA:  Right ankle sore  EXAM: RIGHT ANKLE - COMPLETE 3+ VIEW  COMPARISON:  None.  FINDINGS: Changes are noted consistent with a soft tissue wound. No underlying bony abnormality is seen. No bony erosion is noted. Calcaneal spurs are seen.  IMPRESSION: Soft tissue changes without acute bony abnormality.   Electronically Signed  By: Inez Catalina M.D.   On: 09/23/2013 17:19   Ct Foot Right W Contrast  09/23/2013   CLINICAL DATA:  Right ankle ulcer.  Evaluate for osteomyelitis.  EXAM: CT OF THE RIGHT FOOT WITH CONTRAST  TECHNIQUE:  Multidetector CT imaging was performed following the standard protocol during bolus administration of intravenous contrast.  CONTRAST:  40mL OMNIPAQUE IOHEXOL 300 MG/ML  SOLN  COMPARISON:  DG ANKLE COMPLETE*R* dated 09/23/2013  FINDINGS: Motion artifact is present. Prominent soft tissue swelling is present about the ankle. Soft tissue ulceration appears to be present in this region. Bony erosion in the region of the medial malleolus cannot be excluded and osteomyelitis is suspected. Confirmation with MRI and/or triphasic bone scan can be obtained. Degenerative change present.  IMPRESSION: Diffuse prominent soft tissue swelling is present. Soft tissue ulceration over the medial malleolus appears to be present. Bony erosion in the region of the medial malleolus cannot be excluded and osteomyelitis suspected. Confirmation with MRI and/or triphasic bone scan can be obtained.   Electronically Signed   By: Marcello Moores  Register   On: 09/23/2013 19:40   Mr Foot Right W Wo Contrast  09/25/2013   CLINICAL DATA:  Osteomyelitis.  Medial ankle ulcer  EXAM: MRI OF THE RIGHT FOREFOOT WITHOUT AND WITH CONTRAST  TECHNIQUE: Multiplanar, multisequence MR imaging was performed both before and after administration of intravenous contrast.  CONTRAST:  41mL MULTIHANCE GADOBENATE DIMEGLUMINE 529 MG/ML IV SOLN  COMPARISON:  CT scan 09/23/2013  FINDINGS: As demonstrated on the CT scan there is a medial ankle ulceration which appears to grow right down to the bone. The underlying cortex was abnormal on the CT scan and there is mild edema and enhancement on the MRI consistent with cortical and subcortical osteomyelitis. No focal drainable soft tissue abscess. There is diffuse cellulitis and myositis but no findings for pyomyositis or septic arthritis.  The medial and lateral ankle tendons are intact. The posterior tibialis tendon is running right through the infected area and is very attenuated. The Achilles tendon and plantar fascia are intact.  Moderate ankle, midfoot and hindfoot degenerative changes.  IMPRESSION: 1. 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 2. Cellulitis and myofasciitis but no findings for discrete drainable abscess or pyomyositis. 3. The posterior tibialis tendon is likely involved with the infection and is very attenuated.   Electronically Signed   By: Kalman Jewels M.D.   On: 09/25/2013 16:52   Dg Chest Port 1 View  09/25/2013   CLINICAL DATA:  Right PICC line  EXAM: PORTABLE CHEST - 1 VIEW  COMPARISON:  09/23/2013  FINDINGS: Cardiomediastinal silhouette is stable. Right arm PICC line in place with tip distal SVC. No pneumothorax. No acute infiltrate or pulmonary edema.  IMPRESSION: Right PICC line in place.  No pneumothorax.   Electronically Signed   By: Lahoma Crocker M.D.   On: 09/25/2013 18:12         Subjective: Patient was looking forward to surgical procedure today. Denies any chest pain, shortness breath, nausea, vomiting, diarrhea, fevers, chills  Objective: Filed Vitals:   09/27/13 1815 09/27/13 1830 09/27/13 1845 09/27/13 1900  BP: 96/47 93/50  92/46  Pulse: 91 84 80 82  Temp:      TempSrc:      Resp: $Remo'21 13 17 20  'Wlznx$ Height:      Weight:      SpO2: 93% 95% 97% 98%    Intake/Output Summary (  Last 24 hours) at 09/27/13 1910 Last data filed at 09/27/13 1730  Gross per 24 hour  Intake   2600 ml  Output   1900 ml  Net    700 ml   Weight change:  Exam:   General:  Pt is alert, follows commands appropriately, not in acute distress  HEENT: No icterus, No thrush,  /AT  Cardiovascular: RRR, S1/S2, no rubs, no gallops  Respiratory: CTA bilaterally, no wheezing, no crackles, no rhonchi  Abdomen: Soft/+BS, non tender, non distended, no guarding   Data Reviewed: Basic Metabolic Panel:  Recent Labs Lab 09/23/13 1555 09/24/13 0529 09/25/13 0855 09/26/13 0500 09/27/13 0508  NA 132* 135* 136* 137 141  K 4.7  3.7 3.5* 3.6* 3.4*  CL 93* 97 97 99 102  CO2 $Re'28 27 24 26 27  'tzE$ GLUCOSE 306* 247* 252* 190* 185*  BUN $Re'11 7 9 8 9  'yui$ CREATININE 0.57 0.43* 0.41* 0.35* 0.35*  CALCIUM 8.7 8.4 8.6 8.4 8.1*   Liver Function Tests:  Recent Labs Lab 09/23/13 1555  AST 17  ALT 14  ALKPHOS 81  BILITOT 0.5  PROT 7.0  ALBUMIN 3.2*   No results found for this basename: LIPASE, AMYLASE,  in the last 168 hours No results found for this basename: AMMONIA,  in the last 168 hours CBC:  Recent Labs Lab 09/23/13 1555 09/24/13 0529 09/25/13 0855 09/26/13 0500 09/27/13 0508  WBC 19.2* 11.7* 9.0 6.8 5.5  NEUTROABS 17.1*  --   --   --   --   HGB 14.8 13.5 13.6 13.6 13.6  HCT 44.0 41.1 39.8 39.9 40.0  MCV 89.2 89.2 88.6 88.3 88.3  PLT 222 176 169 183 181   Cardiac Enzymes: No results found for this basename: CKTOTAL, CKMB, CKMBINDEX, TROPONINI,  in the last 168 hours BNP: No components found with this basename: POCBNP,  CBG:  Recent Labs Lab 09/26/13 1629 09/26/13 2104 09/27/13 0559 09/27/13 0947 09/27/13 1801  GLUCAP 171* 171* 173* 190* 181*    Recent Results (from the past 240 hour(s))  URINE CULTURE     Status: None   Collection Time    09/23/13  3:40 PM      Result Value Ref Range Status   Specimen Description URINE, CATHETERIZED   Final   Special Requests NONE   Final   Culture  Setup Time     Final   Value: 09/24/2013 00:15     Performed at [place]     Final   Value: NO GROWTH     Performed at [facility]   Culture     Final   Value: NO GROWTH     Performed at [facility]   Report Status 09/25/2013 FINAL   Final  CULTURE, BLOOD (ROUTINE X 2)     Status: None   Collection Time    09/23/13  3:55 PM      Result Value Ref Range Status   Specimen Description Blood   Final   Special Requests NONE   Final   Culture NO GROWTH 4 DAYS   Final   Report Status PENDING   Incomplete  CULTURE, BLOOD (ROUTINE X 2)     Status: None   Collection  Time    09/23/13  4:02 PM      Result Value Ref Range Status   Specimen Description Blood RIGHT HAND   Final   Special Requests     Final  Value: BOTTLES DRAWN AEROBIC AND ANAEROBIC 5 CC EACH BOTTLE   Culture NO GROWTH 4 DAYS   Final   Report Status PENDING   Incomplete  MRSA PCR SCREENING     Status: None   Collection Time    09/24/13 12:15 AM      Result Value Ref Range Status   MRSA by PCR NEGATIVE  NEGATIVE Final   Comment:            The GeneXpert MRSA Assay (FDA     approved for NASAL specimens     only), is one component of a     comprehensive MRSA colonization     surveillance program. It is not     intended to diagnose MRSA     infection nor to guide or     monitor treatment for     MRSA infections.     Scheduled Meds: . [facility] HOLD] aspirin EC  325 mg Oral Daily  . [facility] HOLD] atorvastatin  20 mg Oral q1800  . [[facility] HOLD] ceFEPime (MAXIPIME) IV  2 g Intravenous Q12H  . [[facility] HOLD] escitalopram  10 mg Oral Daily  . [facility] HOLD] fentaNYL  75 mcg Transdermal Q72H  . [facility] HOLD] gabapentin  300 mg Oral TID  . [facility] HOLD] hydrochlorothiazide  12.5 mg Oral Daily  . [[facility] HOLD] insulin aspart  0-15 Units Subcutaneous TID WC  . [[facility] HOLD] insulin aspart  0-5 Units Subcutaneous QHS  . [[facility] HOLD] insulin aspart  3 Units Subcutaneous TID WC  . [[facility] HOLD] insulin glargine  10 Units Subcutaneous QHS  . [[facility] HOLD] lisinopril  20 mg Oral Daily  . [facility] HOLD] meloxicam  15 mg Oral Daily  . [[facility] HOLD] sodium chloride  10-40 mL Intracatheter Q12H  . [facility] HOLD] vancomycin  1,500 mg Intravenous Q12H   Continuous Infusions: . sodium chloride    . DOPamine    . lactated ringers 50 mL/hr at 09/27/13 1012  . lactated ringers       Fontaine Hehl, DO  [facility] Pager [phone]  If 7PM-7AM, please contact night-coverage [email or web address] Password [email or web address] 09/27/2013, 7:10 PM   LOS: 4 days

## 2013-09-28 ENCOUNTER — Telehealth: Payer: Self-pay | Admitting: Surgery

## 2013-09-28 DIAGNOSIS — Z48812 Encounter for surgical aftercare following surgery on the circulatory system: Secondary | ICD-10-CM

## 2013-09-28 DIAGNOSIS — I872 Venous insufficiency (chronic) (peripheral): Secondary | ICD-10-CM

## 2013-09-28 DIAGNOSIS — I83893 Varicose veins of bilateral lower extremities with other complications: Secondary | ICD-10-CM

## 2013-09-28 LAB — CBC
HEMATOCRIT: 29.7 % — AB (ref 36.0–46.0)
Hemoglobin: 10 g/dL — ABNORMAL LOW (ref 12.0–15.0)
MCH: 29.5 pg (ref 26.0–34.0)
MCHC: 33.7 g/dL (ref 30.0–36.0)
MCV: 87.6 fL (ref 78.0–100.0)
Platelets: 197 10*3/uL (ref 150–400)
RBC: 3.39 MIL/uL — ABNORMAL LOW (ref 3.87–5.11)
RDW: 13.9 % (ref 11.5–15.5)
WBC: 9.8 10*3/uL (ref 4.0–10.5)

## 2013-09-28 LAB — CULTURE, BLOOD (ROUTINE X 2)
CULTURE: NO GROWTH
Culture: NO GROWTH

## 2013-09-28 LAB — BASIC METABOLIC PANEL
BUN: 8 mg/dL (ref 6–23)
CHLORIDE: 104 meq/L (ref 96–112)
CO2: 26 mEq/L (ref 19–32)
Calcium: 7.6 mg/dL — ABNORMAL LOW (ref 8.4–10.5)
Creatinine, Ser: 0.38 mg/dL — ABNORMAL LOW (ref 0.50–1.10)
GFR calc non Af Amer: >90 mL/min (ref >90)
GLUCOSE: 169 mg/dL — AB (ref 70–99)
Potassium: 3.7 mEq/L (ref 3.7–5.3)
Sodium: 141 mEq/L (ref 137–147)

## 2013-09-28 LAB — GLUCOSE, CAPILLARY
GLUCOSE-CAPILLARY: 175 mg/dL — AB (ref 70–99)
GLUCOSE-CAPILLARY: 190 mg/dL — AB (ref 70–99)
Glucose-Capillary: 197 mg/dL — ABNORMAL HIGH (ref 70–99)
Glucose-Capillary: 171 mg/dL — ABNORMAL HIGH (ref 70–99)

## 2013-09-28 MED ORDER — SILVER SULFADIAZINE 1 % EX CREA
TOPICAL_CREAM | Freq: Every day | CUTANEOUS | Status: DC
  Administered 2013-09-28 – 2013-09-29 (×2): via TOPICAL
  Administered 2013-09-30: 1 via TOPICAL
  Administered 2013-10-01 – 2013-10-03 (×3): via TOPICAL
  Filled 2013-09-28 (×2): qty 85

## 2013-09-28 NOTE — Progress Notes (Addendum)
[facility] Progress Note  09/28/2013 7:32 AM 1 Day Post-Op  Subjective:  "I'm a little sore"  afebrile VSS 92% RA Filed Vitals:   09/28/13 0347  BP: 134/46  Pulse: 79  Temp: 98.6 F (37 C)  Resp: 18    Physical Exam: Incisions:  Incisional wound vac to right groin with good seal.  Other incisions are c/d/i Extremities:  + audible peroneal > PT on right  CBC    Component Value Date/Time   WBC 9.8 09/28/2013 0350   RBC 3.39* 09/28/2013 0350   HGB 10.0* 09/28/2013 0350   HCT 29.7* 09/28/2013 0350   PLT 197 09/28/2013 0350   MCV 87.6 09/28/2013 0350   MCH 29.5 09/28/2013 0350   MCHC 33.7 09/28/2013 0350   RDW 13.9 09/28/2013 0350   LYMPHSABS 1.0 09/23/2013 1555   MONOABS 1.1* 09/23/2013 1555   EOSABS 0.0 09/23/2013 1555   BASOSABS 0.0 09/23/2013 1555    BMET    Component Value Date/Time   NA 141 09/28/2013 0350   K 3.7 09/28/2013 0350   CL 104 09/28/2013 0350   CO2 26 09/28/2013 0350   GLUCOSE 169* 09/28/2013 0350   BUN 8 09/28/2013 0350   CREATININE 0.38* 09/28/2013 0350   CREATININE 0.44* 07/22/2013 1612   CALCIUM 7.6* 09/28/2013 0350   GFRNONAA >90 09/28/2013 0350   GFRAA >90 09/28/2013 0350    INR No results found for this basename: inr     Intake/Output Summary (Last 24 hours) at 09/28/13 0732 Last data filed at 09/28/13 0700  Gross per 24 hour  Intake   3850 ml  Output   2305 ml  Net   1545 ml     Assessment:  58 y.o. female is s/p:  BYPASS GRAFT FEMORAL-TIBIAL ARTERY-RIGHT (Right  1 Day Post-Op  Plan: -doing well this am.  PT here to help with ambulation -DVT prophylaxis:  None at this time as she had a bloody ooze in the OR.  Will hold one more day as drain has 70 cc out over night.  Will keep drain another day as well. -continue wound vac to right groin (incisional only-wound is not open) until she is discharged home as this will be difficult to heal. -ok to transfer to [facility]. -diabetic management per primary team. -acute surgical blood loss anemia-pt  tolerating.   Doreatha MassedSamantha Rhyne, PA-C [facility] [phone] 09/28/2013 7:32 AM    I agree with the above.  The patient has excellent Doppler signals in her posterior tibial and dorsalis pedis artery. Continue incisional VAC to right groin Monitor JP output Transfer to floor Continue IV antibiotics Appreciate Dr. Lajoyce Cornersuda assistance for wound care.   Durene CalWells Brabham

## 2013-09-28 NOTE — Progress Notes (Signed)
Patient received from PACU with wound VAC to right groin set to 75 mmHg of low continuous suction. No drainage or leak noted. Will continue to monitor.   Rochele PagesMelissa A Stuart Guillen, RN

## 2013-09-28 NOTE — Progress Notes (Signed)
*  PRELIMINARY RESULTS* Vascular Ultrasound Limited lower extremity venous competency evaluation has been completed.  Preliminary findings:   The right femoral, popliteal, and posterior tibial veins were evaluated and demonstrates no reflux. The left common femoral, femoral, and greater saphenous veins were evaluated. No reflux in deep veins, reflux noted in GSV.     Farrel DemarkJill Eunice, RDMS, RVT  09/28/2013, 4:35 PM

## 2013-09-28 NOTE — Progress Notes (Signed)
Patient being transferred to 2W per doctors orders. Report called.  Medications up to date. Belongings gathered.

## 2013-09-28 NOTE — Progress Notes (Signed)
VASCULAR LAB PRELIMINARY  ARTERIAL  ABI completed:    RIGHT    LEFT    PRESSURE WAVEFORM  PRESSURE WAVEFORM  BRACHIAL NA- restricted arm  BRACHIAL 130 Tri  DP 54 Damp mono DP    AT   AT Could not eval due to dressings/ bandages   PT 76 Damp mono PT    PER   PER    GREAT TOE  NA GREAT TOE  NA    RIGHT LEFT  ABI 0.58      [place] ChardJill I Eunice, RVT 09/28/2013, 4:37 PM

## 2013-09-28 NOTE — Evaluation (Signed)
Physical Therapy Evaluation Patient Details Name: [name] MRN: [id number] DOB: November 28, 1955 Today's Date: 09/28/2013   History of Present Illness  58 y/o female s/p BYPASS GRAFT FEMORAL-TIBIAL ARTERY-RIGHT (Right)  Clinical Impression  Pt adm with the above dx. Pt reports available 24 hour assistance from son at home upon d/c when medically stable. Pt required increased time for sit to stand transfer but tolerated ambulation well on this date. Pt to benefit from skilled acute PT to address limitations listed below.     Follow Up Recommendations Home health PT;Supervision/Assistance - 24 hour    Equipment Recommendations  3in1 (PT)    Recommendations for Other Services       Precautions / Restrictions Precautions Precautions: Fall Restrictions Weight Bearing Restrictions: No      Mobility  Bed Mobility               General bed mobility comments: not assessed. pt in chair upon PT arrival   Transfers Overall transfer level: Needs assistance Equipment used: Rolling walker (2 wheeled) Transfers: Sit to/from Stand Sit to Stand: Min assist         General transfer comment: pt req inc time to complete sit to stand transfer. pt req RW for support. min A and verbal cues for safety and hand placement  Ambulation/Gait Ambulation/Gait assistance: Min assist Ambulation Distance (Feet): 75 Feet Assistive device: Rolling walker (2 wheeled) Gait Pattern/deviations: Step-to pattern;Decreased stride length;Decreased weight shift to right Gait velocity: slow; guarded for falls   General Gait Details: pt reports "uncomfort" in R LE with some pain but states it is not due to WB through R LE. pt req RW to amb   Stairs            Wheelchair Mobility    Modified Rankin (Stroke Patients Only)       Balance Overall balance assessment: Needs assistance Sitting-balance support: Feet supported Sitting balance-Leahy Scale: Fair Sitting balance - Comments: pt able to  sit up edge of chair for 5 min prior to amb    Standing balance support: Bilateral upper extremity supported Standing balance-Leahy Scale: Poor Standing balance comment: pt req RW and Bilat UE support to maintain standing balance. pt req verbal cues to correct standing posture to be more upright.                              Pertinent Vitals/Pain Pt reports pain in R LE during amb. Pt's Bilat ankle and foot blister sores very sensitive to touch.     Home Living Family/patient expects to be discharged to:: Private residence Living Arrangements: Children (son) Available Help at Discharge: Family;Available 24 hours/day Type of Home: House Home Access: Level entry     Home Layout: One level Home Equipment: Walker - 2 wheels;Shower seat      Prior Function Level of Independence: Independent               Hand Dominance        Extremity/Trunk Assessment   Upper Extremity Assessment: Overall WFL for tasks assessed           Lower Extremity Assessment: RLE deficits/detail RLE Deficits / Details: weakness and decreased ROM due to fem tib bypass sx    Cervical / Trunk Assessment: Kyphotic  Communication   Communication: No difficulties  Cognition Arousal/Alertness: Awake/alert Behavior During Therapy: WFL for tasks assessed/performed Overall Cognitive Status: Within Functional Limits for tasks assessed  General Comments General comments (skin integrity, edema, etc.): pt presents with Bilat blister sores on ankles and below. sores were wrapped by nursing staff. pt reports buttock pain from sitting in chair in the wrong position.     Exercises General Exercises - Lower Extremity Ankle Circles/Pumps: AROM;Strengthening;Both;10 reps;Seated Long Arc Quad: AROM;Strengthening;Both;10 reps;Seated      Assessment/Plan    PT Assessment Patient needs continued PT services  PT Diagnosis Difficulty walking;Acute pain;Generalized  weakness   PT Problem List Decreased strength;Decreased range of motion;Decreased activity tolerance;Decreased balance;Decreased mobility;Decreased coordination;Decreased knowledge of use of DME  PT Treatment Interventions DME instruction;Gait training;Stair training;Functional mobility training;Therapeutic activities;Therapeutic exercise;Balance training;Patient/family education   PT Goals (Current goals can be found in the Care Plan section) Acute Rehab PT Goals Patient Stated Goal: return home PT Goal Formulation: With patient Time For Goal Achievement: 10/05/13 Potential to Achieve Goals: Good    Frequency Min 3X/week   Barriers to discharge        Co-evaluation               End of Session Equipment Utilized During Treatment: Gait belt (RW) Activity Tolerance: Patient tolerated treatment well Patient left: in chair;with call bell/phone within reach Nurse Communication: Mobility status         Time: 0726-0759 PT Time Calculation (min): 33 min   Charges:         PT G Codes:          Malachi Paradisendrew Kohler SPT 09/28/2013, 8:28 AM  Agree with above assessment.  Lewis ShockAshly Natasja Niday, PT, DPT Pager #: [phone] Office #: [phone]

## 2013-09-28 NOTE — Progress Notes (Signed)
Tel shows inverted p waves, at times with heart rate 72, then back to sinus rhythm 72 patient asymptomatic. 12 lead shows sinus rhythm. Text to  Dr. Gwenlyn PerkingMadera .Georgette DoverKimberly A Akisha Sturgill

## 2013-09-28 NOTE — Evaluation (Signed)
Physical Therapy Evaluation Patient Details Name: [name] MRN: [id number] DOB: Sep 17, 1955 Today's Date: 09/28/2013   History of Present Illness  58 y/o female s/p BYPASS GRAFT FEMORAL-TIBIAL ARTERY-RIGHT (Right)  Clinical Impression  Pt adm with the above dx. Pt reports available 24 hour assistance from son at home upon d/c when medically stable. Pt required increased time for sit to stand transfer but tolerated ambulation well on this date. Pt to benefit from skilled acute PT to address limitations listed below.     Follow Up Recommendations Home health PT;Supervision/Assistance - 24 hour    Equipment Recommendations  3in1 (PT)    Recommendations for Other Services       Precautions / Restrictions Precautions Precautions: Fall Restrictions Weight Bearing Restrictions: No      Mobility  Bed Mobility               General bed mobility comments: not assessed. pt in chair upon PT arrival   Transfers Overall transfer level: Needs assistance Equipment used: Rolling walker (2 wheeled) Transfers: Sit to/from Stand Sit to Stand: Min assist         General transfer comment: pt req inc time to complete sit to stand transfer. pt req RW for support. min A and verbal cues for safety and hand placement  Ambulation/Gait Ambulation/Gait assistance: Min assist Ambulation Distance (Feet): 75 Feet Assistive device: Rolling walker (2 wheeled) Gait Pattern/deviations: Step-to pattern;Decreased stride length;Decreased weight shift to right Gait velocity: slow; guarded for falls   General Gait Details: pt reports "uncomfort" in R LE with some pain but states it is not due to WB through R LE. pt req RW to amb   Stairs            Wheelchair Mobility    Modified Rankin (Stroke Patients Only)       Balance Overall balance assessment: Needs assistance Sitting-balance support: Feet supported Sitting balance-Leahy Scale: Fair Sitting balance - Comments: pt able to  sit up edge of chair for 5 min prior to amb    Standing balance support: Bilateral upper extremity supported Standing balance-Leahy Scale: Poor Standing balance comment: pt req RW and Bilat UE support to maintain standing balance. pt req verbal cues to correct standing posture to be more upright.                              Pertinent Vitals/Pain Pt reports pain in R LE during amb. Pt's Bilat ankle and foot blister sores very sensitive to touch.     Home Living Family/patient expects to be discharged to:: Private residence Living Arrangements: Children (son) Available Help at Discharge: Family;Available 24 hours/day Type of Home: House Home Access: Level entry     Home Layout: One level Home Equipment: Walker - 2 wheels;Shower seat      Prior Function Level of Independence: Independent               Hand Dominance        Extremity/Trunk Assessment   Upper Extremity Assessment: Overall WFL for tasks assessed           Lower Extremity Assessment: RLE deficits/detail RLE Deficits / Details: weakness and decreased ROM due to fem tib bypass sx    Cervical / Trunk Assessment: Kyphotic  Communication   Communication: No difficulties  Cognition Arousal/Alertness: Awake/alert Behavior During Therapy: WFL for tasks assessed/performed Overall Cognitive Status: Within Functional Limits for tasks assessed  General Comments General comments (skin integrity, edema, etc.): pt presents with Bilat blister sores on ankles and below. sores were wrapped by nursing staff. pt reports buttock pain from sitting in chair in the wrong position.     Exercises General Exercises - Lower Extremity Ankle Circles/Pumps: AROM;Strengthening;Both;10 reps;Seated Long Arc Quad: AROM;Strengthening;Both;10 reps;Seated      Assessment/Plan    PT Assessment Patient needs continued PT services  PT Diagnosis Difficulty walking;Acute pain;Generalized  weakness   PT Problem List Decreased strength;Decreased range of motion;Decreased activity tolerance;Decreased balance;Decreased mobility;Decreased coordination;Decreased knowledge of use of DME  PT Treatment Interventions DME instruction;Gait training;Stair training;Functional mobility training;Therapeutic activities;Therapeutic exercise;Balance training;Patient/family education   PT Goals (Current goals can be found in the Care Plan section) Acute Rehab PT Goals Patient Stated Goal: return home PT Goal Formulation: With patient Time For Goal Achievement: 10/05/13 Potential to Achieve Goals: Good    Frequency Min 3X/week   Barriers to discharge        Co-evaluation               End of Session Equipment Utilized During Treatment: Gait belt (RW) Activity Tolerance: Patient tolerated treatment well Patient left: in chair;with call bell/phone within reach Nurse Communication: Mobility status         Time: 0726-0759 PT Time Calculation (min): 33 min   Charges:         PT G Codes:          Malachi Paradisendrew Karin Griffith SPT 09/28/2013, 8:28 AM

## 2013-09-28 NOTE — Progress Notes (Signed)
Patient ID: [name], female   DOB: 1955/12/07, 58 y.o.   MRN: [id number] Postoperative day 1 status post revascularization right lower extremity with venous stasis insufficiency bilateral lower extremities. Will have wound care wrap the left lower extremity with a Profore compression wrap. We will start Silvadene dressing changes to the right ankle. We'll hold on compression to the right lower extremity secondary to the recent revascularization.

## 2013-09-28 NOTE — Consult Note (Addendum)
Pt has been surgically revascularized on the right per [facility] and they are managing wound care to the right groin.  Dr. Lajoyce Cornersuda consulted for wound care and has requested WOC nurse to place Profore to the LLE.  I have reviewed the chart and verified with MD ABI results with Dr. Lajoyce Cornersuda. R: 0.31/L: 0.37 07/20/13.   He wishes to have me proceed with light Profore (3 layers) and ask that bedside nurses assess the LLE more frequently after placement.    New orders written.  Will follow with Dr. Lajoyce Cornersuda and [facility] as needed.  Will plan to reasses LLE tomorrow as well and change next Wednesday if this patient still inpatient. Otherwise will follow up with Dr. Lajoyce Cornersuda in his office.  Yanina Knupp [place] RN, [place] [phone]

## 2013-09-28 NOTE — Progress Notes (Signed)
OT Cancellation Note  Patient Details Name: Kaitlin RainsCheryl C [name] MRN: [id number] DOB: [birth year]   Cancelled Treatment:    Reason Eval/Treat Not Completed: Other (comment) (Pt being transferred to floor. Will see tomorrow.)  [name], OTR/L  [phone] 09/28/2013 09/28/2013, 5:31 PM

## 2013-09-28 NOTE — Progress Notes (Signed)
PROGRESS NOTE  [name] CWU:[id number] DOB: 08/15/1955 DOA: 09/23/2013 PCP: Leonides Grills, MD  Interim history  58 year old female with a history of diabetes mellitus, hypertension, peripheral vascular disease with a nonhealing right medial malleoli wound presented to the ED at [facility] after the patient's son noted her to be more confused on the day of admission. The patient had been going to the wound care center at [facility], but her right malleolus wound has not made significant progress. The patient had a right common iliac angioplasty and stent placed on 08/15/2013 by Dr. Scot Dock. The patient has not been on any antibiotics recently. In the ED, the patient was noted to be febrile with temperature 100.50F, heart rate 120s, WBC 19.1. She was transferred to [facility] for definitive care vascular surgery as well as antibiotics for her wound. MRI of the right lower extremity showed osteomyelitis of the right ankle with myositis. The patient was continued on vancomycin and cefepime. WBC count has improved to 5.5. The patient has defervescence. Orthopedic surgery was consulted, but there are no definitive plans for debridement at this time. A PICC line was placed with the intention of 6 weeks of intravenous antibiotics.   Assessment/Plan: Sepsis  -sepsis essentially resolved -no further fever and normal WBC's now. 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  -positive probe to bone test on exam -MRI foot--shows osteomyelitis, myositis  -Continue broad spectrum antibiotics for now -will follow ortho rec's -ESR--70   Atherosclerosis of native arteries of the extremities with  ulceration, PVD  -s/p revascularization on 09/27/13 -will follow vascular service rec's  Tobacco abuse  -This is contributing to her poor wound healing  -Tobacco cessation discussed   Hypertension  -Continue HCTZ and lisinopril  -BP stable and well controlled  DM, with complications of neuropathy and vasculopathy  -Hemoglobin A1c 7.6  -Continue Lantus 10 units at bedtime  -continue NovoLog sliding-scale -carb modify diet -CBG's < 200   Hyperlipidemia  -Continue statin   Family Communication: no family at beside  Disposition Plan: Home when medically stable    Procedures/Studies:  CXR 09/23/2013 No active cardiopulmonary disease.  Right ankle XRAY 09/23/2013 Soft tissue changes without acute bony abnormality.  Right foot CT 09/23/2013 Diffuse prominent soft tissue swelling is present. Soft tissue ulceration over the medial malleolus appears to be present. Bony erosion in the region of the medial malleolus cannot be excluded and osteomyelitis suspected. Confirmation with MRI and/or triphasic bone scan can be obtained.   Antibiotics:  Vancomycin 4/3 -->  Maxipime 4/3 -->   Procedures/Studies: Dg Chest 2 View  09/23/2013   CLINICAL DATA:  Fever and weakness  EXAM: CHEST  2 VIEW  COMPARISON:  07/10/2011  FINDINGS: The heart size and mediastinal contours are within normal limits. Both lungs are clear. The visualized skeletal structures are unremarkable.  IMPRESSION: No active cardiopulmonary disease.   Electronically Signed   By: Inez Catalina M.D.   On: 09/23/2013 17:19   Dg Ankle Complete Right  09/23/2013   CLINICAL DATA:  Right ankle sore  EXAM: RIGHT ANKLE - COMPLETE 3+ VIEW  COMPARISON:  None.  FINDINGS: Changes are noted consistent with a soft tissue  wound. No underlying bony abnormality is seen. No bony erosion is noted. Calcaneal spurs are seen.  IMPRESSION: Soft tissue changes without acute bony abnormality.   Electronically Signed   By: Inez Catalina M.D.   On: 09/23/2013 17:19    Ct Foot Right W Contrast  09/23/2013   CLINICAL DATA:  Right ankle ulcer.  Evaluate for osteomyelitis.  EXAM: CT OF THE RIGHT FOOT WITH CONTRAST  TECHNIQUE: Multidetector CT imaging was performed following the standard protocol during bolus administration of intravenous contrast.  CONTRAST:  39mL OMNIPAQUE IOHEXOL 300 MG/ML  SOLN  COMPARISON:  DG ANKLE COMPLETE*R* dated 09/23/2013  FINDINGS: Motion artifact is present. Prominent soft tissue swelling is present about the ankle. Soft tissue ulceration appears to be present in this region. Bony erosion in the region of the medial malleolus cannot be excluded and osteomyelitis is suspected. Confirmation with MRI and/or triphasic bone scan can be obtained. Degenerative change present.  IMPRESSION: Diffuse prominent soft tissue swelling is present. Soft tissue ulceration over the medial malleolus appears to be present. Bony erosion in the region of the medial malleolus cannot be excluded and osteomyelitis suspected. Confirmation with MRI and/or triphasic bone scan can be obtained.   Electronically Signed   By: Marcello Moores  Register   On: 09/23/2013 19:40   Mr Foot Right W Wo Contrast  09/25/2013   CLINICAL DATA:  Osteomyelitis.  Medial ankle ulcer  EXAM: MRI OF THE RIGHT FOREFOOT WITHOUT AND WITH CONTRAST  TECHNIQUE: Multiplanar, multisequence MR imaging was performed both before and after administration of intravenous contrast.  CONTRAST:  88mL MULTIHANCE GADOBENATE DIMEGLUMINE 529 MG/ML IV SOLN  COMPARISON:  CT scan 09/23/2013  FINDINGS: As demonstrated on the CT scan there is a medial ankle ulceration which appears to grow right down to the bone. The underlying cortex was abnormal on the CT scan and there is mild edema and enhancement on the MRI consistent with cortical and subcortical osteomyelitis. No focal drainable soft tissue abscess. There is diffuse cellulitis and myositis but no findings for pyomyositis or septic arthritis.  The medial and lateral ankle tendons  are intact. The posterior tibialis tendon is running right through the infected area and is very attenuated. The Achilles tendon and plantar fascia are intact. Moderate ankle, midfoot and hindfoot degenerative changes.  IMPRESSION: 1. 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 2. Cellulitis and myofasciitis but no findings for discrete drainable abscess or pyomyositis. 3. The posterior tibialis tendon is likely involved with the infection and is very attenuated.   Electronically Signed   By: Kalman Jewels M.D.   On: 09/25/2013 16:52   Dg Chest Port 1 View  09/25/2013   CLINICAL DATA:  Right PICC line  EXAM: PORTABLE CHEST - 1 VIEW  COMPARISON:  09/23/2013  FINDINGS: Cardiomediastinal silhouette is stable. Right arm PICC line in place with tip distal SVC. No pneumothorax. No acute infiltrate or pulmonary edema.  IMPRESSION: Right PICC line in place.  No pneumothorax.   Electronically Signed   By: Lahoma Crocker M.D.   On: 09/25/2013 18:12    Subjective: Patient s/p revascularization (POD #1); no acute complaints, afebrile and reporting just mild soreness on her RLE.  Objective: Filed Vitals:   09/27/13 2200 09/27/13 2315 09/28/13 0347 09/28/13 0700  BP: 113/52 121/65 134/46 108/40  Pulse: 77 91 79   Temp:  97.8 F (36.6 C) 98.6 F (37 C) 98.2 F (36.8 C)  TempSrc:  Oral Oral Oral  Resp: $Remo'17 23 18   'BrVyC$ Height:      Weight:      SpO2: 97% 99% 92%     Intake/Output Summary (Last 24 hours) at 09/28/13 0846 Last data filed at 09/28/13 0700  Gross per 24 hour  Intake   3850 ml  Output   2305 ml  Net   1545 ml   Weight change:  Exam:   General:  Pt is alert and in no acute distress, follows commands appropriately, reports soreness on her RLE  HEENT: No icterus, No thrush,  [place]/AT  Cardiovascular: RRR, S1/S2, no rubs, no gallops  Respiratory: CTA bilaterally, no wheezing, no crackles, no rhonchi  Abdomen:  Soft/+BS, non tender, non distended, no guarding  Extremities: BLE varicose ulcer (right bigger than left); RLE s/p fem/pop bypass, drain in place, no significant suppuration and no redness on her wound. Slight tenderness. BLE swelling trace to 1 ++   Data Reviewed: Basic Metabolic Panel:  Recent Labs Lab 09/24/13 0529 09/25/13 0855 09/26/13 0500 09/27/13 0508 09/28/13 0350  NA 135* 136* 137 141 141  K 3.7 3.5* 3.6* 3.4* 3.7  CL 97 97 99 102 104  CO2 $Re'27 24 26 27 26  'HgF$ GLUCOSE 247* 252* 190* 185* 169*  BUN $Re'7 9 8 9 8  'ZEB$ CREATININE 0.43* 0.41* 0.35* 0.35* 0.38*  CALCIUM 8.4 8.6 8.4 8.1* 7.6*   Liver Function Tests:  Recent Labs Lab 09/23/13 1555  AST 17  ALT 14  ALKPHOS 81  BILITOT 0.5  PROT 7.0  ALBUMIN 3.2*   CBC:  Recent Labs Lab 09/23/13 1555 09/24/13 0529 09/25/13 0855 09/26/13 0500 09/27/13 0508 09/28/13 0350  WBC 19.2* 11.7* 9.0 6.8 5.5 9.8  NEUTROABS 17.1*  --   --   --   --   --   HGB 14.8 13.5 13.6 13.6 13.6 10.0*  HCT 44.0 41.1 39.8 39.9 40.0 29.7*  MCV 89.2 89.2 88.6 88.3 88.3 87.6  PLT 222 176 169 183 181 197   CBG:  Recent Labs Lab 09/27/13 0559 09/27/13 0947 09/27/13 1801 09/27/13 2148 09/28/13 0842  GLUCAP 173* 190* 181* 190* 197*    Recent Results (from the past 240 hour(s))  URINE CULTURE     Status: None   Collection Time    09/23/13  3:40 PM      Result Value Ref Range Status   Specimen Description URINE, CATHETERIZED   Final   Special Requests NONE   Final   Culture  Setup Time     Final   Value: 09/24/2013 00:15     Performed at [facility] Count     Final   Value: NO GROWTH     Performed at [facility]   Culture     Final   Value: NO GROWTH     Performed at [facility]   Report Status 09/25/2013 FINAL   Final  CULTURE, BLOOD (ROUTINE X 2)     Status: None   Collection Time    09/23/13  3:55 PM      Result Value Ref Range Status   Specimen Description Blood   Final   Special  Requests NONE   Final   Culture NO GROWTH 4 DAYS   Final   Report Status PENDING   Incomplete  CULTURE, BLOOD (ROUTINE X 2)     Status: None   Collection Time    09/23/13  4:02 PM  Result Value Ref Range Status   Specimen Description Blood RIGHT HAND   Final   Special Requests     Final   Value: BOTTLES DRAWN AEROBIC AND ANAEROBIC 5 CC EACH BOTTLE   Culture NO GROWTH 4 DAYS   Final   Report Status PENDING   Incomplete  MRSA PCR SCREENING     Status: None   Collection Time    09/24/13 12:15 AM      Result Value Ref Range Status   MRSA by PCR NEGATIVE  NEGATIVE Final   Comment:            The GeneXpert MRSA Assay (FDA     approved for NASAL specimens     only), is one component of a     comprehensive MRSA colonization     surveillance program. It is not     intended to diagnose MRSA     infection nor to guide or     monitor treatment for     MRSA infections.     Scheduled Meds: . aspirin EC  325 mg Oral Daily  . atorvastatin  20 mg Oral q1800  . ceFEPime (MAXIPIME) IV  2 g Intravenous Q12H  . docusate sodium  100 mg Oral Daily  . escitalopram  10 mg Oral Daily  . fentaNYL  75 mcg Transdermal Q72H  . gabapentin  300 mg Oral TID  . hydrochlorothiazide  12.5 mg Oral Daily  . insulin aspart  0-15 Units Subcutaneous TID WC  . insulin aspart  0-5 Units Subcutaneous QHS  . insulin aspart  3 Units Subcutaneous TID WC  . insulin glargine  10 Units Subcutaneous QHS  . lisinopril  20 mg Oral Daily  . meloxicam  15 mg Oral Daily  . pantoprazole  40 mg Oral Daily  . silver sulfADIAZINE   Topical Daily  . sodium chloride  10-40 mL Intracatheter Q12H  . vancomycin  1,500 mg Intravenous Q12H   Continuous Infusions: . DOPamine       Barton Dubois, DO  [facility] Pager [phone]  If 7PM-7AM, please contact night-coverage [email or web address] Password [email or web address] 09/28/2013, 8:46 AM   LOS: 5 days

## 2013-09-28 NOTE — Telephone Encounter (Addendum)
Message copied by Rosalyn ChartersOUX, BONNIE A on Wed Sep 28, 2013 10:46 AM ------      Message from: Lorin MercyMCCHESNEY, MARILYN K      Created: Wed Sep 28, 2013  8:53 AM      Regarding: schedule                   ----- Message -----         From: Dara LordsSamantha J Rhyne, PA-C         Sent: 09/28/2013   7:42 AM           To: [facility] Charge Pool            S/p right fem tib bypass 09/27/13.  F/u with Br[facility]ham in 2 weeks.            Thanks ------  notified patient of post op appt. n 10-24-13 at 8:30 am with [name]

## 2013-09-29 ENCOUNTER — Encounter (HOSPITAL_COMMUNITY): Payer: Self-pay | Admitting: Surgery

## 2013-09-29 LAB — COMPREHENSIVE METABOLIC PANEL
ALT: 7 U/L (ref 0–35)
AST: 10 U/L (ref 0–37)
Albumin: 2.1 g/dL — ABNORMAL LOW (ref 3.5–5.2)
Alkaline Phosphatase: 48 U/L (ref 39–117)
BUN: 5 mg/dL — ABNORMAL LOW (ref 6–23)
CO2: 26 meq/L (ref 19–32)
Calcium: 8 mg/dL — ABNORMAL LOW (ref 8.4–10.5)
Chloride: 104 mEq/L (ref 96–112)
Creatinine, Ser: 0.33 mg/dL — ABNORMAL LOW (ref 0.50–1.10)
GFR calc Af Amer: >90 mL/min (ref >90)
Glucose, Bld: 240 mg/dL — ABNORMAL HIGH (ref 70–99)
Potassium: 3.1 mEq/L — ABNORMAL LOW (ref 3.7–5.3)
SODIUM: 143 meq/L (ref 137–147)
TOTAL PROTEIN: 5.2 g/dL — AB (ref 6.0–8.3)
Total Bilirubin: <0.2 mg/dL — ABNORMAL LOW (ref 0.3–1.2)

## 2013-09-29 LAB — CBC
HCT: 27.2 % — ABNORMAL LOW (ref 36.0–46.0)
HEMATOCRIT: 26.9 % — AB (ref 36.0–46.0)
HEMOGLOBIN: 9.1 g/dL — AB (ref 12.0–15.0)
Hemoglobin: 9.3 g/dL — ABNORMAL LOW (ref 12.0–15.0)
MCH: 29.7 pg (ref 26.0–34.0)
MCH: 29.4 pg (ref 26.0–34.0)
MCHC: 34.2 g/dL (ref 30.0–36.0)
MCHC: 33.8 g/dL (ref 30.0–36.0)
MCV: 86.9 fL (ref 78.0–100.0)
MCV: 87.1 fL (ref 78.0–100.0)
Platelets: 201 10*3/uL (ref 150–400)
Platelets: 211 10*3/uL (ref 150–400)
RBC: 3.13 MIL/uL — AB (ref 3.87–5.11)
RBC: 3.09 MIL/uL — ABNORMAL LOW (ref 3.87–5.11)
RDW: 14.2 % (ref 11.5–15.5)
RDW: 14 % (ref 11.5–15.5)
WBC: 7.2 10*3/uL (ref 4.0–10.5)
WBC: 7.5 10*3/uL (ref 4.0–10.5)

## 2013-09-29 LAB — BASIC METABOLIC PANEL
BUN: 5 mg/dL — ABNORMAL LOW (ref 6–23)
CALCIUM: 8 mg/dL — AB (ref 8.4–10.5)
CO2: 25 meq/L (ref 19–32)
Chloride: 102 mEq/L (ref 96–112)
Creatinine, Ser: 0.37 mg/dL — ABNORMAL LOW (ref 0.50–1.10)
GFR calc Af Amer: >90 mL/min (ref >90)
GFR calc non Af Amer: >90 mL/min (ref >90)
GLUCOSE: 175 mg/dL — AB (ref 70–99)
Potassium: 3.1 mEq/L — ABNORMAL LOW (ref 3.7–5.3)
Sodium: 141 mEq/L (ref 137–147)

## 2013-09-29 LAB — GLUCOSE, CAPILLARY
GLUCOSE-CAPILLARY: 231 mg/dL — AB (ref 70–99)
Glucose-Capillary: 173 mg/dL — ABNORMAL HIGH (ref 70–99)
Glucose-Capillary: 219 mg/dL — ABNORMAL HIGH (ref 70–99)
Glucose-Capillary: 181 mg/dL — ABNORMAL HIGH (ref 70–99)

## 2013-09-29 MED ORDER — POTASSIUM CHLORIDE CRYS ER 20 MEQ PO TBCR
40.0000 meq | EXTENDED_RELEASE_TABLET | Freq: Every day | ORAL | Status: DC
  Administered 2013-09-29 – 2013-10-03 (×5): 40 meq via ORAL
  Filled 2013-09-29 (×5): qty 2

## 2013-09-29 NOTE — Progress Notes (Signed)
PROGRESS NOTE  [name]:[id number] DOB: 11-02-1955 DOA: 09/23/2013 PCP: Leonides Grills, MD  Interim history  58 year old female with a history of diabetes mellitus, hypertension, peripheral vascular disease with a nonhealing right medial malleoli wound presented to the ED at [facility] after the patient's son noted her to be more confused on the day of admission. The patient had been going to the wound care center at [facility], but her right malleolus wound has not made significant progress. The patient had a right common iliac angioplasty and stent placed on 08/15/2013 by Dr. Scot Dock. The patient has not been on any antibiotics recently. In the ED, the patient was noted to be febrile with temperature 100.16F, heart rate 120s, WBC 19.1. She was transferred to [facility] for definitive care vascular surgery as well as antibiotics for her wound. MRI of the right lower extremity showed osteomyelitis of the right ankle with myositis. The patient was continued on vancomycin and cefepime. WBC count has improved to 5.5. The patient has defervescence. Orthopedic surgery was consulted, but there are no definitive plans for debridement at this time. A PICC line was placed with the intention of 6 weeks of intravenous antibiotics.   Assessment/Plan: Sepsis secondary to diabetic foot ulcer -sepsis essentially resolved -no further fever and normal WBC's now. -Secondary to diabetic foot infection/osteomyelits on the right foot -Chest x-ray and urinalysis negative  -Continue broad spectrum vancomycin and cefepime for 1 more day; plan is for patient to be discharge on IV vancomycin and PO levaquin and complete a total of 6 weeks antibiotics. 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    -positive probe to bone test on exam -MRI foot--shows osteomyelitis, myositis  -Continue broad spectrum antibiotics for now; as mentioned above plan is for 6 weeks abx's therapy with vancomycin and PO levaquin -will follow ortho rec's -ESR--70   Atherosclerosis of native arteries of the extremities with ulceration, PVD  -s/p revascularization on 09/27/13 -will follow vascular service rec's -improving and doing ok  Tobacco abuse  -This is contributing to her poor wound healing  -Tobacco cessation discussed   Hypertension  -Continue HCTZ and lisinopril  -BP stable and well controlled  DM, with complications of neuropathy and vasculopathy  -Hemoglobin A1c 7.6  -Continue Lantus 10 units at bedtime  -continue NovoLog sliding-scale -carb modify diet -CBG's < 200   Hyperlipidemia  -Continue statin   Family Communication: no family at beside  Disposition Plan: Home when medically stable    Procedures/Studies:  CXR 09/23/2013 No active cardiopulmonary disease.  Right ankle XRAY 09/23/2013 Soft tissue changes without acute bony abnormality.  Right foot CT 09/23/2013 Diffuse prominent soft tissue swelling is present. Soft tissue ulceration over the medial malleolus appears to be present. Bony erosion in the region of the medial malleolus cannot be excluded and osteomyelitis suspected. Confirmation with MRI and/or triphasic bone scan can be obtained.   Antibiotics:  Vancomycin 4/3 -->  Maxipime 4/3 --> 4/9   Procedures/Studies: Dg Chest 2 View  09/23/2013   CLINICAL DATA:  Fever and weakness  EXAM: CHEST  2 VIEW  COMPARISON:  07/10/2011  FINDINGS: The heart size and mediastinal contours are within normal limits. Both lungs are clear. The visualized skeletal structures are unremarkable.  IMPRESSION: No  active cardiopulmonary disease.   Electronically Signed   By: Inez Catalina M.D.   On: 09/23/2013 17:19   Dg Ankle Complete Right  09/23/2013   CLINICAL DATA:  Right ankle sore  EXAM: RIGHT  ANKLE - COMPLETE 3+ VIEW  COMPARISON:  None.  FINDINGS: Changes are noted consistent with a soft tissue wound. No underlying bony abnormality is seen. No bony erosion is noted. Calcaneal spurs are seen.  IMPRESSION: Soft tissue changes without acute bony abnormality.   Electronically Signed   By: Inez Catalina M.D.   On: 09/23/2013 17:19   Ct Foot Right W Contrast  09/23/2013   CLINICAL DATA:  Right ankle ulcer.  Evaluate for osteomyelitis.  EXAM: CT OF THE RIGHT FOOT WITH CONTRAST  TECHNIQUE: Multidetector CT imaging was performed following the standard protocol during bolus administration of intravenous contrast.  CONTRAST:  69mL OMNIPAQUE IOHEXOL 300 MG/ML  SOLN  COMPARISON:  DG ANKLE COMPLETE*R* dated 09/23/2013  FINDINGS: Motion artifact is present. Prominent soft tissue swelling is present about the ankle. Soft tissue ulceration appears to be present in this region. Bony erosion in the region of the medial malleolus cannot be excluded and osteomyelitis is suspected. Confirmation with MRI and/or triphasic bone scan can be obtained. Degenerative change present.  IMPRESSION: Diffuse prominent soft tissue swelling is present. Soft tissue ulceration over the medial malleolus appears to be present. Bony erosion in the region of the medial malleolus cannot be excluded and osteomyelitis suspected. Confirmation with MRI and/or triphasic bone scan can be obtained.   Electronically Signed   By: Marcello Moores  Register   On: 09/23/2013 19:40   Mr Foot Right W Wo Contrast  09/25/2013   CLINICAL DATA:  Osteomyelitis.  Medial ankle ulcer  EXAM: MRI OF THE RIGHT FOREFOOT WITHOUT AND WITH CONTRAST  TECHNIQUE: Multiplanar, multisequence MR imaging was performed both before and after administration of intravenous contrast.  CONTRAST:  36mL MULTIHANCE GADOBENATE DIMEGLUMINE 529 MG/ML IV SOLN  COMPARISON:  CT scan 09/23/2013  FINDINGS: As demonstrated on the CT scan there is a medial ankle ulceration which appears to grow right down to  the bone. The underlying cortex was abnormal on the CT scan and there is mild edema and enhancement on the MRI consistent with cortical and subcortical osteomyelitis. No focal drainable soft tissue abscess. There is diffuse cellulitis and myositis but no findings for pyomyositis or septic arthritis.  The medial and lateral ankle tendons are intact. The posterior tibialis tendon is running right through the infected area and is very attenuated. The Achilles tendon and plantar fascia are intact. Moderate ankle, midfoot and hindfoot degenerative changes.  IMPRESSION: 1. 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 2. Cellulitis and myofasciitis but no findings for discrete drainable abscess or pyomyositis. 3. The posterior tibialis tendon is likely involved with the infection and is very attenuated.   Electronically Signed   By: Kalman Jewels M.D.   On: 09/25/2013 16:52   Dg Chest Port 1 View  09/25/2013   CLINICAL DATA:  Right PICC line  EXAM: PORTABLE CHEST - 1 VIEW  COMPARISON:  09/23/2013  FINDINGS: Cardiomediastinal silhouette is stable. Right arm PICC line in place with tip distal SVC. No pneumothorax. No acute infiltrate or pulmonary edema.  IMPRESSION: Right PICC line in place.  No pneumothorax.   Electronically Signed   By: Lahoma Crocker M.D.   On: 09/25/2013 18:12    Subjective: Patient s/p revascularization (  POD #2); no acute complaints, afebrile and reporting just mild soreness on her RLE. No fever  Objective: Filed Vitals:   09/28/13 1413 09/28/13 2043 09/29/13 0459 09/29/13 1159  BP: 110/50 124/43 140/80 131/62  Pulse: 75 77 80   Temp: 98.1 F (36.7 C) 99 F (37.2 C) 98.6 F (37 C)   TempSrc: Oral Oral Oral   Resp: $Remo'18 18 18   'aMrLl$ Height:      Weight:      SpO2: 96% 98% 96%     Intake/Output Summary (Last 24 hours) at 09/29/13 1209 Last data filed at 09/29/13 1000  Gross per 24 hour  Intake   1040 ml  Output    2575 ml  Net  -1535 ml   Weight change:  Exam:   General:  Pt is alert and in no acute distress, follows commands appropriately, reports soreness on her RLE  HEENT: No icterus, No thrush,  [place]/AT  Cardiovascular: RRR, S1/S2, no rubs, no gallops  Respiratory: CTA bilaterally, no wheezing, no crackles, no rhonchi  Abdomen: Soft/+BS, non tender, non distended, no guarding  Extremities: BLE varicose ulcer (right bigger than left); RLE s/p fem/pop bypass, drain in place, no significant suppuration and no redness on her wound. Slight tenderness. BLE swelling trace to 1 ++   Data Reviewed: Basic Metabolic Panel:  Recent Labs Lab 09/26/13 0500 09/27/13 0508 09/28/13 0350 09/29/13 0615 09/29/13 1115  NA 137 141 141 141 143  K 3.6* 3.4* 3.7 3.1* 3.1*  CL 99 102 104 102 104  CO2 $Re'26 27 26 25 26  'Mcl$ GLUCOSE 190* 185* 169* 175* 240*  BUN $Re'8 9 8 'nOq$ 5* 5*  CREATININE 0.35* 0.35* 0.38* 0.37* 0.33*  CALCIUM 8.4 8.1* 7.6* 8.0* 8.0*   Liver Function Tests:  Recent Labs Lab 09/23/13 1555 09/29/13 1115  AST 17 10  ALT 14 7  ALKPHOS 81 48  BILITOT 0.5 <0.2*  PROT 7.0 5.2*  ALBUMIN 3.2* 2.1*   CBC:  Recent Labs Lab 09/23/13 1555  09/26/13 0500 09/27/13 0508 09/28/13 0350 09/29/13 0615 09/29/13 1115  WBC 19.2*  < > 6.8 5.5 9.8 7.2 7.5  NEUTROABS 17.1*  --   --   --   --   --   --   HGB 14.8  < > 13.6 13.6 10.0* 9.3* 9.1*  HCT 44.0  < > 39.9 40.0 29.7* 27.2* 26.9*  MCV 89.2  < > 88.3 88.3 87.6 86.9 87.1  PLT 222  < > 183 181 197 201 211  < > = values in this interval not displayed. CBG:  Recent Labs Lab 09/28/13 1215 09/28/13 1700 09/28/13 2047 09/29/13 0619 09/29/13 1122  GLUCAP 175* 171* 190* 173* 219*    Recent Results (from the past 240 hour(s))  URINE CULTURE     Status: None   Collection Time    09/23/13  3:40 PM      Result Value Ref Range Status   Specimen Description URINE, CATHETERIZED   Final   Special Requests NONE   Final   Culture  Setup Time      Final   Value: 09/24/2013 00:15     Performed at [facility]     Final   Value: NO GROWTH     Performed at [facility]   Culture     Final   Value: NO GROWTH     Performed at [facility]   Report Status 09/25/2013 FINAL   Final  CULTURE, BLOOD (ROUTINE X 2)     Status: None   Collection Time    09/23/13  3:55 PM      Result Value Ref Range Status   Specimen Description Blood   Final   Special Requests NONE   Final   Culture NO GROWTH 5 DAYS   Final   Report Status 09/28/2013 FINAL   Final  CULTURE, BLOOD (ROUTINE X 2)     Status: None   Collection Time    09/23/13  4:02 PM      Result Value Ref Range Status   Specimen Description Blood RIGHT HAND   Final   Special Requests     Final   Value: BOTTLES DRAWN AEROBIC AND ANAEROBIC 5 CC EACH BOTTLE   Culture NO GROWTH 5 DAYS   Final   Report Status 09/28/2013 FINAL   Final  MRSA PCR SCREENING     Status: None   Collection Time    09/24/13 12:15 AM      Result Value Ref Range Status   MRSA by PCR NEGATIVE  NEGATIVE Final   Comment:            The GeneXpert MRSA Assay (FDA     approved for NASAL specimens     only), is one component of a     comprehensive MRSA colonization     surveillance program. It is not     intended to diagnose MRSA     infection nor to guide or     monitor treatment for     MRSA infections.     Scheduled Meds: . aspirin EC  325 mg Oral Daily  . atorvastatin  20 mg Oral q1800  . ceFEPime (MAXIPIME) IV  2 g Intravenous Q12H  . docusate sodium  100 mg Oral Daily  . escitalopram  10 mg Oral Daily  . fentaNYL  75 mcg Transdermal Q72H  . gabapentin  300 mg Oral TID  . hydrochlorothiazide  12.5 mg Oral Daily  . insulin aspart  0-15 Units Subcutaneous TID WC  . insulin aspart  0-5 Units Subcutaneous QHS  . insulin aspart  3 Units Subcutaneous TID WC  . insulin glargine  10 Units Subcutaneous QHS  . lisinopril  20 mg Oral Daily  . meloxicam  15 mg Oral Daily    . pantoprazole  40 mg Oral Daily  . potassium chloride  40 mEq Oral Daily  . silver sulfADIAZINE   Topical Daily  . sodium chloride  10-40 mL Intracatheter Q12H  . vancomycin  1,500 mg Intravenous Q12H   Continuous Infusions: . DOPamine     Time < 30 minutes  Barton Dubois, DO  [facility] Pager [phone]  If 7PM-7AM, please contact night-coverage [email or web address] Password [email or web address] 09/29/2013, 12:09 PM   LOS: 6 days

## 2013-09-29 NOTE — Progress Notes (Addendum)
Report given to receiving RN. Patient in bed rest watching TV. No verbal complaints and no signs or symptoms of distress or discomfort.

## 2013-09-29 NOTE — Progress Notes (Signed)
Physical Therapy Treatment Patient Details Name: Kaitlin RainsCheryl C [name] MRN: [id number] DOB: May 08, 1956 Today's Date: 09/29/2013    History of Present Illness 58 y/o female s/p BYPASS GRAFT FEMORAL-TIBIAL ARTERY-RIGHT (Right)    PT Comments    Progressing well.  Education completed.  Mobility improving as pain lessens  Follow Up Recommendations  Home health PT;Supervision/Assistance - 24 hour     Equipment Recommendations  3in1 (PT)    Recommendations for Other Services       Precautions / Restrictions Precautions Precautions: Fall    Mobility  Bed Mobility                  Transfers Overall transfer level: Needs assistance Equipment used: Rolling walker (2 wheeled) Transfers: Sit to/from Stand Sit to Stand: Min guard;From elevated surface         General transfer comment: good technique  Ambulation/Gait Ambulation/Gait assistance: Min guard Ambulation Distance (Feet): 160 Feet Assistive device: Rolling walker (2 wheeled) Gait Pattern/deviations: Step-through pattern;Antalgic;Wide base of support Gait velocity: slower, but progressed speed to cue Gait velocity interpretation: at or above normal speed for age/gender General Gait Details: antalgic gait improved steadily as discomfort decreased   Stairs            Wheelchair Mobility    Modified Rankin (Stroke Patients Only)       Balance Overall balance assessment: Needs assistance Sitting-balance support: Feet supported;No upper extremity supported Sitting balance-Leahy Scale: Good     Standing balance support: During functional activity;No upper extremity supported Standing balance-Leahy Scale: Fair                      Cognition Arousal/Alertness: Awake/alert Behavior During Therapy: WFL for tasks assessed/performed Overall Cognitive Status: Within Functional Limits for tasks assessed                      Exercises      General Comments        Pertinent  Vitals/Pain 5/10 pain, RN made aware.    Home Living                      Prior Function            PT Goals (current goals can now be found in the care plan section) Acute Rehab PT Goals Patient Stated Goal: return home PT Goal Formulation: With patient Time For Goal Achievement: 10/05/13 Potential to Achieve Goals: Good Progress towards PT goals: Progressing toward goals    Frequency  Min 3X/week    PT Plan Current plan remains appropriate    Co-evaluation             End of Session   Activity Tolerance: Patient tolerated treatment well Patient left: with call bell/phone within reach;Other (comment);with family[facility]visitor present [facility]     Time: [id number] PT Time Calculation (min): 20 min  Charges:  $Gait Training: 8-22 mins                    G CodesEliseo Gum:      Geron Mulford V Cedar Ditullio 09/29/2013, 12:55 PM 09/29/2013  Elsie BingKen Rahn Lacuesta, PT [phone]  (pager)

## 2013-09-29 NOTE — Progress Notes (Signed)
Subjective: Interval History: none.. has been up walking  Objective: Vital signs in last 24 hours: Temp:  [98.1 F (36.7 C)-99 F (37.2 C)] 98.6 F (37 C) (04/09 0459) Pulse Rate:  [75-80] 80 (04/09 0459) Resp:  [18] 18 (04/09 0459) BP: (110-140)/(43-80) 140/80 mmHg (04/09 0459) SpO2:  [96 %-98 %] 96 % (04/09 0459)  Intake/Output from previous day: 04/08 0701 - 04/09 0700 In: 1685 [P.O.:840; I.V.:225; IV Piggyback:550] Out: 2340 [Urine:2150; Drains:190] Intake/Output this shift:    Foot well-perfused. Moderate output from the JP drain the popliteal space back dressing intact in the groin. Continuing Silvadene dressing to her foot.  Lab Results:  Recent Labs  09/27/13 0508 09/28/13 0350  WBC 5.5 9.8  HGB 13.6 10.0*  HCT 40.0 29.7*  PLT 181 197   BMET  Recent Labs  09/27/13 0508 09/28/13 0350  NA 141 141  K 3.4* 3.7  CL 102 104  CO2 27 26  GLUCOSE 185* 169*  BUN 9 8  CREATININE 0.35* 0.38*  CALCIUM 8.1* 7.6*    Studies/Results: Dg Chest 2 View  09/23/2013   CLINICAL DATA:  Fever and weakness  EXAM: CHEST  2 VIEW  COMPARISON:  07/10/2011  FINDINGS: The heart size and mediastinal contours are within normal limits. Both lungs are clear. The visualized skeletal structures are unremarkable.  IMPRESSION: No active cardiopulmonary disease.   Electronically Signed   By: Alcide CleverMark  Lukens M.D.   On: 09/23/2013 17:19   Dg Ankle Complete Right  09/23/2013   CLINICAL DATA:  Right ankle sore  EXAM: RIGHT ANKLE - COMPLETE 3+ VIEW  COMPARISON:  None.  FINDINGS: Changes are noted consistent with a soft tissue wound. No underlying bony abnormality is seen. No bony erosion is noted. Calcaneal spurs are seen.  IMPRESSION: Soft tissue changes without acute bony abnormality.   Electronically Signed   By: Alcide CleverMark  Lukens M.D.   On: 09/23/2013 17:19   Ct Foot Right W Contrast  09/23/2013   CLINICAL DATA:  Right ankle ulcer.  Evaluate for osteomyelitis.  EXAM: CT OF THE RIGHT FOOT WITH CONTRAST   TECHNIQUE: Multidetector CT imaging was performed following the standard protocol during bolus administration of intravenous contrast.  CONTRAST:  80mL OMNIPAQUE IOHEXOL 300 MG/ML  SOLN  COMPARISON:  DG ANKLE COMPLETE*R* dated 09/23/2013  FINDINGS: Motion artifact is present. Prominent soft tissue swelling is present about the ankle. Soft tissue ulceration appears to be present in this region. Bony erosion in the region of the medial malleolus cannot be excluded and osteomyelitis is suspected. Confirmation with MRI and/or triphasic bone scan can be obtained. Degenerative change present.  IMPRESSION: Diffuse prominent soft tissue swelling is present. Soft tissue ulceration over the medial malleolus appears to be present. Bony erosion in the region of the medial malleolus cannot be excluded and osteomyelitis suspected. Confirmation with MRI and/or triphasic bone scan can be obtained.   Electronically Signed   By: Maisie Fushomas  Register   On: 09/23/2013 19:40   Mr Foot Right W Wo Contrast  09/25/2013   CLINICAL DATA:  Osteomyelitis.  Medial ankle ulcer  EXAM: MRI OF THE RIGHT FOREFOOT WITHOUT AND WITH CONTRAST  TECHNIQUE: Multiplanar, multisequence MR imaging was performed both before and after administration of intravenous contrast.  CONTRAST:  20mL MULTIHANCE GADOBENATE DIMEGLUMINE 529 MG/ML IV SOLN  COMPARISON:  CT scan 09/23/2013  FINDINGS: As demonstrated on the CT scan there is a medial ankle ulceration which appears to grow right down to the bone. The underlying cortex was  abnormal on the CT scan and there is mild edema and enhancement on the MRI consistent with cortical and subcortical osteomyelitis. No focal drainable soft tissue abscess. There is diffuse cellulitis and myositis but no findings for pyomyositis or septic arthritis.  The medial and lateral ankle tendons are intact. The posterior tibialis tendon is running right through the infected area and is very attenuated. The Achilles tendon and plantar fascia  are intact. Moderate ankle, midfoot and hindfoot degenerative changes.  IMPRESSION: 1. 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 2. Cellulitis and myofasciitis but no findings for discrete drainable abscess or pyomyositis. 3. The posterior tibialis tendon is likely involved with the infection and is very attenuated.   Electronically Signed   By: Loralie Champagne M.D.   On: 09/25/2013 16:52   Dg Chest Port 1 View  09/25/2013   CLINICAL DATA:  Right PICC line  EXAM: PORTABLE CHEST - 1 VIEW  COMPARISON:  09/23/2013  FINDINGS: Cardiomediastinal silhouette is stable. Right arm PICC line in place with tip distal SVC. No pneumothorax. No acute infiltrate or pulmonary edema.  IMPRESSION: Right PICC line in place.  No pneumothorax.   Electronically Signed   By: Natasha Mead M.D.   On: 09/25/2013 18:12   Anti-infectives: Anti-infectives   Start     Dose/Rate Route Frequency Ordered Stop   09/26/13 2200  vancomycin (VANCOCIN) 1,500 mg in sodium chloride 0.9 % 500 mL IVPB     1,500 mg 250 mL/hr over 120 Minutes Intravenous Every 12 hours 09/26/13 1209     09/26/13 1215  vancomycin (VANCOCIN) 1,500 mg in sodium chloride 0.9 % 500 mL IVPB     1,500 mg 250 mL/hr over 120 Minutes Intravenous NOW 09/26/13 1209 09/26/13 1516   09/24/13 0900  vancomycin (VANCOCIN) IVPB 1000 mg/200 mL premix  Status:  Discontinued     1,000 mg 200 mL/hr over 60 Minutes Intravenous Every 12 hours 09/23/13 1914 09/26/13 1209   09/23/13 2200  oseltamivir (TAMIFLU) capsule 75 mg  Status:  Discontinued     75 mg Oral 2 times daily 09/23/13 1839 09/23/13 2105   09/23/13 2000  ceFEPIme (MAXIPIME) 2 g in dextrose 5 % 50 mL IVPB     2 g 100 mL/hr over 30 Minutes Intravenous Every 12 hours 09/23/13 1914     09/23/13 2000  vancomycin (VANCOCIN) IVPB 1000 mg/200 mL premix     1,000 mg 200 mL/hr over 60 Minutes Intravenous Every 1 hr x 2 09/23/13 1914 09/23/13 2323       Assessment/Plan: s/p Procedure(s): BYPASS GRAFT FEMORAL-TIBIAL ARTERY-RIGHT (Right) Stable postop day 2. Continue to mobilize. Plan to DC her JP drain in the morning. Continue local wound care to her ankle ulcer.   LOS: 6 days   Larina Earthly 09/29/2013, 7:46 AM

## 2013-09-29 NOTE — Evaluation (Signed)
Occupational Therapy Evaluation Patient Details Name: [name] MRN: [id number] DOB: 03-31-1956 Today's Date: 09/29/2013    History of Present Illness 58 y/o female s/p BYPASS GRAFT FEMORAL-TIBIAL ARTERY-RIGHT (Right)   Clinical Impression   Pt demonstrates decline in function with ADLs and ADL mobility safety and would benefit from acute OT services to address impairments to increase level of function and safety    Follow Up Recommendations  Home health OT;Supervision/Assistance - 24 hour    Equipment Recommendations  None recommended by OT    Recommendations for Other Services       Precautions / Restrictions Precautions Precautions: Fall Restrictions Weight Bearing Restrictions: No      Mobility Bed Mobility Overal bed mobility: Needs Assistance Bed Mobility: Supine to Sit;Sit to Supine     Supine to sit: Supervision Sit to supine: Supervision      Transfers Overall transfer level: Needs assistance Equipment used: Rolling walker (2 wheeled) Transfers: Sit to/from Stand Sit to Stand: Min guard         General transfer comment: good technique    Balance Overall balance assessment: Needs assistance Sitting-balance support: No upper extremity supported;Feet supported Sitting balance-Leahy Scale: Good     Standing balance support: Single extremity supported;Bilateral upper extremity supported;During functional activity Standing balance-Leahy Scale: Fair                              ADL Overall ADL's : Needs assistance/impaired     Grooming: Wash/dry hands;Wash/dry face;Standing;Min guard   Upper Body Bathing: Supervision/ safety;Set up;Sitting   Lower Body Bathing: Minimal assistance;Sitting/lateral leans;Sit to/from stand   Upper Body Dressing : Set up;Supervision/safety;Sitting   Lower Body Dressing: Minimal assistance;Sitting/lateral leans;Sit to/from stand   Toilet Transfer: BSC;Min guard;Ambulation   Toileting- Clothing  Manipulation and Hygiene: Min guard;Sit to/from stand       Functional mobility during ADLs: Min guard;Rolling walker       Vision  wears reading glasses                   Perception Perception Perception Tested?: No   Praxis Praxis Praxis tested?: Not tested    Pertinent Vitals/Pain R LE surgical site pain 4/10, VSS     Hand Dominance Left   Extremity/Trunk Assessment Upper Extremity Assessment Upper Extremity Assessment: Overall WFL for tasks assessed   Lower Extremity Assessment Lower Extremity Assessment: Defer to PT evaluation   Cervical / Trunk Assessment Cervical / Trunk Assessment: Kyphotic   Communication Communication Communication: No difficulties   Cognition Arousal/Alertness: Awake/alert Behavior During Therapy: WFL for tasks assessed/performed Overall Cognitive Status: Within Functional Limits for tasks assessed                     General Comments   Pt very pleasant and cooperative                 Home Living Family/patient expects to be discharged to:: Private residence Living Arrangements: Children Available Help at Discharge: Family;Available 24 hours/day Type of Home: House Home Access: Level entry     Home Layout: One level     Bathroom Shower/Tub: Producer, television/film/video: Handicapped height     Home Equipment: Environmental consultant - 2 wheels;Shower seat;Cane - single point;Adaptive equipment Adaptive Equipment: Reacher        Prior Functioning/Environment Level of Independence: Independent             OT  Diagnosis: Generalized weakness   OT Problem List: Decreased strength;Decreased knowledge of use of DME or AE;Decreased activity tolerance;Impaired balance (sitting and/or standing)   OT Treatment/Interventions: Self-care/ADL training;Therapeutic activities;Neuromuscular education;DME and/or AE instruction;Patient/family education;Therapeutic exercise;Balance training    OT Goals(Current goals can be  found in the care plan section) Acute Rehab OT Goals Patient Stated Goal: go home OT Goal Formulation: With patient Time For Goal Achievement: 10/06/13 Potential to Achieve Goals: Good ADL Goals Pt Will Perform Grooming: with set-up;with supervision;standing Pt Will Perform Lower Body Bathing: with min guard assist;with supervision;with set-up;sit to/from stand;sitting/lateral leans Pt Will Perform Lower Body Dressing: with min guard assist;with supervision;with set-up;sitting/lateral leans;sit to/from stand Pt Will Transfer to Toilet: with supervision;ambulating;regular height toilet;grab bars Pt Will Perform Toileting - Clothing Manipulation and hygiene: with supervision;sit to/from stand Pt Will Perform Tub/Shower Transfer: with min guard assist;with supervision;ambulating;grab bars  OT Frequency: Min 2X/week   Barriers to D/C:    none                     End of Session Equipment Utilized During Treatment: Rolling walker;Other (comment) (BSC)  Activity Tolerance: Patient tolerated treatment well Patient left: in bed;with call bell/phone within reach;with nursing/sitter in room   Time: 1352-1413 OT Time Calculation (min): 21 min Charges:  OT General Charges $OT Visit: 1 Procedure OT Evaluation $Initial OT Evaluation Tier I: 1 Procedure OT Treatments $Therapeutic Activity: 8-22 mins G-Codes:    Lafe GarinDenise J Ayaan Shutes 09/29/2013, 2:51 PM

## 2013-09-30 LAB — GLUCOSE, CAPILLARY
GLUCOSE-CAPILLARY: 179 mg/dL — AB (ref 70–99)
Glucose-Capillary: 180 mg/dL — ABNORMAL HIGH (ref 70–99)
Glucose-Capillary: 191 mg/dL — ABNORMAL HIGH (ref 70–99)
Glucose-Capillary: 215 mg/dL — ABNORMAL HIGH (ref 70–99)

## 2013-09-30 LAB — VANCOMYCIN, TROUGH: Vancomycin Tr: 14.6 ug/mL (ref 10.0–20.0)

## 2013-09-30 MED ORDER — INSULIN GLARGINE 100 UNIT/ML ~~LOC~~ SOLN
15.0000 [IU] | Freq: Every day | SUBCUTANEOUS | Status: DC
  Administered 2013-09-30 – 2013-10-02 (×3): 15 [IU] via SUBCUTANEOUS
  Filled 2013-09-30 (×5): qty 0.15

## 2013-09-30 MED ORDER — LEVOFLOXACIN 750 MG PO TABS
750.0000 mg | ORAL_TABLET | Freq: Every day | ORAL | Status: DC
  Administered 2013-09-30 – 2013-10-03 (×4): 750 mg via ORAL
  Filled 2013-09-30 (×4): qty 1

## 2013-09-30 NOTE — Progress Notes (Signed)
Physical Therapy Treatment Patient Details Name: Kaitlin RainsCheryl C Baccari MRN: [id number] DOB: [birth year] Today's Date: 09/30/2013    History of Present Illness 58 y/o female s/p BYPASS GRAFT FEMORAL-TIBIAL ARTERY-RIGHT (Right)    PT Comments    General mobility incl gait, transfers and bed mobility improving.    Follow Up Recommendations  Home health PT;Supervision/Assistance - 24 hour     Equipment Recommendations  3in1 (PT)    Recommendations for Other Services       Precautions / Restrictions Precautions Precautions: Fall    Mobility  Bed Mobility Overal bed mobility: Needs Assistance Bed Mobility: Sit to Supine       Sit to supine: Supervision   General bed mobility comments: no assist needed, but minimal struggle to get legs in the bed.  Transfers Overall transfer level: Needs assistance Equipment used: Rolling walker (2 wheeled) Transfers: Sit to/from Stand Sit to Stand: Supervision         General transfer comment: safe technique  Ambulation/Gait Ambulation/Gait assistance: Supervision Ambulation Distance (Feet): 250 Feet Assistive device: Rolling walker (2 wheeled) Gait Pattern/deviations: Step-through pattern Gait velocity: still slower Gait velocity interpretation: Below normal speed for age/gender General Gait Details: Initially antalgic, but quickly gait became more fluid   Stairs            Wheelchair Mobility    Modified Rankin (Stroke Patients Only)       Balance Overall balance assessment: Needs assistance Sitting-balance support: Feet supported;No upper extremity supported Sitting balance-Leahy Scale: Good     Standing balance support: During functional activity Standing balance-Leahy Scale: Fair                      Cognition Arousal/Alertness: Awake/alert Behavior During Therapy: WFL for tasks assessed/performed Overall Cognitive Status: Within Functional Limits for tasks assessed                       Exercises General Exercises - Lower Extremity Ankle Circles/Pumps: AROM;Both;15 reps Quad Sets: AROM;Both;10 reps;Supine Gluteal Sets: AROM;Both;10 reps;Strengthening Heel Slides: AROM;Strengthening;Both;10 reps;Supine;Other (comment) (resisted flexion and extension) Hip ABduction/ADduction: AROM;15 reps;Both;Supine Straight Leg Raises: PROM;Both;10 reps    General Comments        Pertinent Vitals/Pain     Home Living                      Prior Function            PT Goals (current goals can now be found in the care plan section) Acute Rehab PT Goals PT Goal Formulation: With patient Time For Goal Achievement: 10/05/13 Potential to Achieve Goals: Good Progress towards PT goals: Progressing toward goals    Frequency  Min 3X/week    PT Plan Current plan remains appropriate    Co-evaluation             End of Session   Activity Tolerance: Patient tolerated treatment well Patient left: in bed;with call bell/phone within reach     Time: 1200-1225 PT Time Calculation (min): 25 min  Charges:  $Gait Training: 8-22 mins $Therapeutic Exercise: 8-22 mins                    G Codes:      Eliseo GumKenneth V Reyes Fifield 09/30/2013, 1:10 PM 09/30/2013  [place]ilworth BingKen Vash Quezada, PT [phone]  (pager)

## 2013-09-30 NOTE — Progress Notes (Signed)
Occupational Therapy Treatment Patient Details Name: Kaitlin RainsCheryl C [name] MRN: [id number] DOB: 1956-04-05 Today's Date: 09/30/2013    History of present illness 58 y/o female s/p BYPASS GRAFT FEMORAL-TIBIAL ARTERY-RIGHT (Right)   OT comments  Pt. Doing well.  Is transferring MOD I to/from [facility]BSC next to bed.  Able to tolerate several standing tasks with no issues noted.  Expressed wanting to walk more during the day.  Agree with initial recommendation for HHOT and 24/H S when ready for D/C.    Follow Up Recommendations  Home health OT;Supervision/Assistance - 24 hour    Equipment Recommendations  None recommended by OT          Precautions / Restrictions Precautions Precautions: Fall       Mobility Bed Mobility Overal bed mobility: Needs Assistance Bed Mobility: Rolling;Sidelying to Sit;Supine to Sit;Sit to Supine Rolling: Supervision Sidelying to sit: Supervision Supine to sit: Supervision Sit to supine: Supervision Sit to sidelying: Supervision General bed mobility comments: inst. cues [facility]/lying down to prevent having to re-adjust after already in bed  Transfers Overall transfer level: Needs assistance Equipment used: Rolling walker (2 wheeled)   Sit to Stand: Supervision         General transfer comment: MOD I with stand pivot transfer eob/bsc, min guard a for amb. in room secondary to tubes and lines. also intermittent cues for staying inside the walker during ambulation                                       ADL Overall ADL's : Needs assistance/impaired     Grooming: Wash/dry hands;Wash/dry face;Oral care;Brushing hair;Supervision/safety;Standing Grooming Details (indicate cue type and reason): cues for standing inside of walker at the sink Upper Body Bathing: Supervision/ safety;Standing     Lower Body Bathing Details (indicate cue type and reason): sim min guard a sit/stand, mostly for tube/line  management   Upper Body Dressing Details (indicate cue type and reason): sim. set up seated Lower Body Dressing: Supervision/safety;Sitting/lateral leans Lower Body Dressing Details (indicate cue type and reason): able to don/doff LB clothing while seated EOB Toilet Transfer: Modified Independent;BSC Toilet Transfer Details (indicate cue type and reason): BSC placed beside bed.  pt. is transferring to/from throughout day/night Toileting- ArchitectClothing Manipulation and Hygiene: Modified independent;Sit to/from stand       Functional mobility during ADLs: Supervision/safety;Rolling walker General ADL Comments: progressing well.  is MOD I with bsc toileting tasks, and was able to complete several grooming/bathing tasks in standing with s                                                                                              General Comments  "i was told i need to walk at least 2-3 times a day, and i want to be walking more".      Pertinent Vitals/ Pain       No c/o pain from pt.  Frequency Min 2X/week     Progress Toward Goals  OT Goals(current goals can now be found in the care plan section)  Progress towards OT goals: Progressing toward goals     Plan Discharge plan remains appropriate                     End of Session Equipment Utilized During Treatment: Rolling walker   Activity Tolerance Patient tolerated treatment well   Patient Left in bed;with call bell/phone within reach   Nurse Communication          Time: 1610-9604 OT Time Calculation (min): 16 min  Charges: OT General Charges $OT Visit: 1 Procedure OT Treatments $Self Care/Home Management : 8-22 mins  Earvin Hansen Daniela Hernan, COTA/L 09/30/2013, 8:38 AM

## 2013-09-30 NOTE — Progress Notes (Addendum)
ANTIBIOTIC CONSULT NOTE - FOLLOW UP  Pharmacy Consult for Vancomycin  Indication: LE wounds, osteomyelitis  Allergies  Allergen Reactions  . Penicillins Hives    Tolerates cefepime  . Adhesive [Tape] Rash    Patient Measurements: Height: 5\' 2"  (157.5 cm) Weight: 237 lb 3.4 oz (107.6 kg) IBW/kg (Calculated) : 50.1  Vital Signs: Temp: 98.5 F (36.9 C) (04/10 1315) Temp src: Oral (04/10 1315) BP: 114/47 mmHg (04/10 1315) Pulse Rate: 75 (04/10 1315) Intake/Output from previous day: 04/09 0701 - 04/10 0700 In: 1690 [P.O.:1680; I.V.:10] Out: 1110 [Urine:850; Drains:260] Intake/Output from this shift: Total I/O In: 480 [P.O.:480] Out: 690 [Urine:650; Drains:40]  Labs:  Recent Labs  09/28/13 0350 09/29/13 0615 09/29/13 1115  WBC 9.8 7.2 7.5  HGB 10.0* 9.3* 9.1*  PLT 197 201 211  CREATININE 0.38* 0.37* 0.33*   Estimated Creatinine Clearance: 90.6 ml/min (by C-G formula based on Cr of 0.33).  Recent Labs  09/30/13 1045  VANCOTROUGH 14.6     Microbiology:  4/3 Vanc>> 4/6 VT= 8.1 (goal 15-20) 4/3 Cefepime>>  4/3 blood x2>>neg 4/3 urine>>neg  Assessment: 58 yo F with hx PVD and multiple LE wounds concerning for infection/osteo.  Pt was seen by [facility] and is POD#3 LE arterial bypass.  Pt continues on day#8 of empiric IV antibiotics.  Cx NGTD.  Vancomycin trough today = 14.6 on Vancomycin 1.5 gm IV q12h regimen.  Anticipate patient will accumulate drug and trough will be >15 over time.  Goal of Therapy:  Vancomycin trough level 15-20 mcg/ml  Plan:  Continue Vancomycin 1.5g IV q12h.   Recommend weekly Vancomycin trough and BMET upon discharge.  Toys 'R' UsKimberly Dhanush Jokerst, Pharm.D., BCPS Clinical Pharmacist Pager [phone] 09/30/2013 2:08 PM

## 2013-09-30 NOTE — Progress Notes (Addendum)
[facility]  Subjective  - She states she is doing well over all, she did walk some yesterday.   Objective 158/61 68 98 F (36.7 C) (Oral) 20 99%  Intake/Output Summary (Last 24 hours) at 09/30/13 0739 Last data filed at 09/30/13 [id number]  Gross per 24 hour  Intake   1690 ml  Output   1110 ml  Net    580 ml    Right ankle dressing clean and dry Leg warm to touch, wound vac in place over groin incision JP drain output 260 for last 24 hours   Assessment/Planning: POD # 3 BYPASS GRAFT FEMORAL-TIBIAL ARTERY-RIGHT (Right)  Continue wound care to both feet Maintain wound vac and JP until output is 50 cc or below in 8 hours.   Lars Magemma M Collins 09/30/2013 7:39 AM --  Laboratory Lab Results:  Recent Labs  09/29/13 0615 09/29/13 1115  WBC 7.2 7.5  HGB 9.3* 9.1*  HCT 27.2* 26.9*  PLT 201 211   BMET  Recent Labs  09/29/13 0615 09/29/13 1115  NA 141 143  K 3.1* 3.1*  CL 102 104  CO2 25 26  GLUCOSE 175* 240*  BUN 5* 5*  CREATININE 0.37* 0.33*  CALCIUM 8.0* 8.0*    COAG No results found for this basename: INR, PROTIME   No results found for this basename: PTT      I have examined the patient, reviewed and agree with above.  Larina Earthlyodd F Sabra Sessler, MD 09/30/2013 2:58 PM

## 2013-09-30 NOTE — Progress Notes (Signed)
PROGRESS NOTE  [name] HUD:[id number] DOB: Mar 29, 1956 DOA: 09/23/2013 PCP: Leonides Grills, MD  Interim history  58 year old female with a history of diabetes mellitus, hypertension, peripheral vascular disease with a nonhealing right medial malleoli wound presented to the ED at [facility] after the patient's son noted her to be more confused on the day of admission. The patient had been going to the wound care center at [facility], but her right malleolus wound has not made significant progress. The patient had a right common iliac angioplasty and stent placed on 08/15/2013 by Dr. Scot Dock. The patient has not been on any antibiotics recently. In the ED, the patient was noted to be febrile with temperature 100.33F, heart rate 120s, WBC 19.1. She was transferred to [facility] for definitive care vascular surgery as well as antibiotics for her wound. MRI of the right lower extremity showed osteomyelitis of the right ankle with myositis. The patient was continued on vancomycin and cefepime. WBC count has improved to 5.5. The patient has defervescence. Orthopedic surgery was consulted, but there are no definitive plans for debridement at this time. A PICC line was placed with the intention of 6 weeks of intravenous antibiotics.   Assessment/Plan: Sepsis secondary to diabetic foot ulcer -sepsis resolved -no further fever and normal WBC's now. -Secondary to diabetic foot infection/osteomyelits on the right foot -Chest x-ray and urinalysis negative on admission. Patient denies cough, chest pain, shortness of breath, dysuria -Continue now on IV vancomycin and PO levaquin and complete a total of 6 weeks antibiotics. -Blood cultures has remained negative  -will continue to follow up with ortho for further rec's regarding wound care and needed surgical interventions -plan is to complete a total of 6 weeks of IV antibiotics and Levaquin.  Diabetic foot infection with presumptive right ankle osteomyelitis   -positive probe to bone test on exam -MRI foot--shows osteomyelitis, myositis  -Continue broad spectrum antibiotics for now; as mentioned above plan is for 6 weeks abx's therapy with vancomycin and PO levaquin -will follow ortho rec's -ESR--70   Atherosclerosis of native arteries of the extremities with ulceration, PVD  -s/p revascularization on 09/27/13 -will follow vascular service rec's -As slowly continued to improve  Tobacco abuse  -This is contributing to her poor wound healing  -Tobacco cessation discussed   Hypertension  -Continue HCTZ and lisinopril  -BP stable and well controlled  DM, with complications of neuropathy and vasculopathy  -Hemoglobin A1c 7.6  -Patient experiencing some hyperglycemia -Continue Lantus, but will increase dose to 15 units at bedtime  -continue NovoLog sliding-scale -Continue carb modify diet -Follow CBG's  Hyperlipidemia  -Continue statin   Family Communication: no family at beside  Disposition Plan: Home with home health services when medically stable   Procedures/Studies:  CXR 09/23/2013 No active cardiopulmonary disease.  Right ankle XRAY 09/23/2013 Soft tissue changes without acute bony abnormality.  Right foot CT 09/23/2013 Diffuse prominent soft tissue swelling is present. Soft tissue ulceration over the medial malleolus appears to be present. Bony erosion in the region of the medial malleolus cannot be excluded and osteomyelitis suspected. Confirmation with MRI and/or triphasic bone scan can be obtained.   Antibiotics:  Vancomycin 4/3 -->  Maxipime 4/3 --> 4/9   Procedures/Studies: Dg Chest 2 View  09/23/2013   CLINICAL DATA:  Fever and weakness  EXAM: CHEST  2 VIEW  COMPARISON:  07/10/2011  FINDINGS: The heart size and mediastinal contours are within normal limits. Both lungs are  clear. The visualized skeletal structures are unremarkable.  IMPRESSION: No active cardiopulmonary disease.   Electronically Signed   By: Inez Catalina M.D.    On: 09/23/2013 17:19   Dg Ankle Complete Right  09/23/2013   CLINICAL DATA:  Right ankle sore  EXAM: RIGHT ANKLE - COMPLETE 3+ VIEW  COMPARISON:  None.  FINDINGS: Changes are noted consistent with a soft tissue wound. No underlying bony abnormality is seen. No bony erosion is noted. Calcaneal spurs are seen.  IMPRESSION: Soft tissue changes without acute bony abnormality.   Electronically Signed   By: Inez Catalina M.D.   On: 09/23/2013 17:19   Ct Foot Right W Contrast  09/23/2013   CLINICAL DATA:  Right ankle ulcer.  Evaluate for osteomyelitis.  EXAM: CT OF THE RIGHT FOOT WITH CONTRAST  TECHNIQUE: Multidetector CT imaging was performed following the standard protocol during bolus administration of intravenous contrast.  CONTRAST:  38mL OMNIPAQUE IOHEXOL 300 MG/ML  SOLN  COMPARISON:  DG ANKLE COMPLETE*R* dated 09/23/2013  FINDINGS: Motion artifact is present. Prominent soft tissue swelling is present about the ankle. Soft tissue ulceration appears to be present in this region. Bony erosion in the region of the medial malleolus cannot be excluded and osteomyelitis is suspected. Confirmation with MRI and/or triphasic bone scan can be obtained. Degenerative change present.  IMPRESSION: Diffuse prominent soft tissue swelling is present. Soft tissue ulceration over the medial malleolus appears to be present. Bony erosion in the region of the medial malleolus cannot be excluded and osteomyelitis suspected. Confirmation with MRI and/or triphasic bone scan can be obtained.   Electronically Signed   By: Marcello Moores  Register   On: 09/23/2013 19:40   Mr Foot Right W Wo Contrast  09/25/2013   CLINICAL DATA:  Osteomyelitis.  Medial ankle ulcer  EXAM: MRI OF THE RIGHT FOREFOOT WITHOUT AND WITH CONTRAST  TECHNIQUE: Multiplanar, multisequence MR imaging was performed both before and after administration of intravenous contrast.  CONTRAST:  76mL MULTIHANCE GADOBENATE DIMEGLUMINE 529 MG/ML IV SOLN  COMPARISON:  CT scan 09/23/2013   FINDINGS: As demonstrated on the CT scan there is a medial ankle ulceration which appears to grow right down to the bone. The underlying cortex was abnormal on the CT scan and there is mild edema and enhancement on the MRI consistent with cortical and subcortical osteomyelitis. No focal drainable soft tissue abscess. There is diffuse cellulitis and myositis but no findings for pyomyositis or septic arthritis.  The medial and lateral ankle tendons are intact. The posterior tibialis tendon is running right through the infected area and is very attenuated. The Achilles tendon and plantar fascia are intact. Moderate ankle, midfoot and hindfoot degenerative changes.  IMPRESSION: 1. 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 2. Cellulitis and myofasciitis but no findings for discrete drainable abscess or pyomyositis. 3. The posterior tibialis tendon is likely involved with the infection and is very attenuated.   Electronically Signed   By: Kalman Jewels M.D.   On: 09/25/2013 16:52   Dg Chest Port 1 View  09/25/2013   CLINICAL DATA:  Right PICC line  EXAM: PORTABLE CHEST - 1 VIEW  COMPARISON:  09/23/2013  FINDINGS: Cardiomediastinal silhouette is stable. Right arm PICC line in place with tip distal SVC. No pneumothorax. No acute infiltrate or pulmonary edema.  IMPRESSION: Right PICC line in place.  No pneumothorax.   Electronically Signed   By: Orlean Bradford.D.  On: 09/25/2013 18:12    Subjective:   Pt is alert and in no acute distress, follows commands appropriately, reports mild pain and swelling on her legs (R > L)  Objective: Filed Vitals:   09/29/13 1345 09/29/13 2100 09/30/13 0525 09/30/13 1315  BP: 135/65 96/69 158/61 114/47  Pulse: 82 68 68 75  Temp: 98.4 F (36.9 C) 98 F (36.7 C) 98 F (36.7 C) 98.5 F (36.9 C)  TempSrc: Oral Oral Oral Oral  Resp: $Remo'20 18 20 20  'EHQTR$ Height:      Weight:      SpO2: 98% 98% 99% 100%     Intake/Output Summary (Last 24 hours) at 09/30/13 1441 Last data filed at 09/30/13 1300  Gross per 24 hour  Intake   1440 ml  Output   1370 ml  Net     70 ml   Weight change:  Exam:   General:  Pt is alert and in no acute distress, follows commands appropriately, reports mild pain and swelling on her legs (R > L)  HEENT: No icterus, No thrush,  [place]/AT  Cardiovascular: RRR, S1/S2, no rubs, no gallops  Respiratory: CTA bilaterally, no wheezing, no crackles, no rhonchi  Abdomen: Soft/+BS, non tender, non distended, no guarding  Extremities: BLE varicose ulcer (right bigger than left); RLE s/p fem/pop bypass, drain still in place, no significant suppuration and no redness on her wound. Slight tenderness. BLE swelling trace to 1-2 ++   Data Reviewed: Basic Metabolic Panel:  Recent Labs Lab 09/26/13 0500 09/27/13 0508 09/28/13 0350 09/29/13 0615 09/29/13 1115  NA 137 141 141 141 143  K 3.6* 3.4* 3.7 3.1* 3.1*  CL 99 102 104 102 104  CO2 $Re'26 27 26 25 26  'SUZ$ GLUCOSE 190* 185* 169* 175* 240*  BUN $Re'8 9 8 'jZJ$ 5* 5*  CREATININE 0.35* 0.35* 0.38* 0.37* 0.33*  CALCIUM 8.4 8.1* 7.6* 8.0* 8.0*   Liver Function Tests:  Recent Labs Lab 09/23/13 1555 09/29/13 1115  AST 17 10  ALT 14 7  ALKPHOS 81 48  BILITOT 0.5 <0.2*  PROT 7.0 5.2*  ALBUMIN 3.2* 2.1*   CBC:  Recent Labs Lab 09/23/13 1555  09/26/13 0500 09/27/13 0508 09/28/13 0350 09/29/13 0615 09/29/13 1115  WBC 19.2*  < > 6.8 5.5 9.8 7.2 7.5  NEUTROABS 17.1*  --   --   --   --   --   --   HGB 14.8  < > 13.6 13.6 10.0* 9.3* 9.1*  HCT 44.0  < > 39.9 40.0 29.7* 27.2* 26.9*  MCV 89.2  < > 88.3 88.3 87.6 86.9 87.1  PLT 222  < > 183 181 197 201 211  < > = values in this interval not displayed. CBG:  Recent Labs Lab 09/29/13 1122 09/29/13 1616 09/29/13 2109 09/30/13 0606 09/30/13 1110  GLUCAP 219* 181* 231* 180* 191*    Recent Results (from the past 240 hour(s))  URINE CULTURE     Status: None   Collection  Time    09/23/13  3:40 PM      Result Value Ref Range Status   Specimen Description URINE, CATHETERIZED   Final   Special Requests NONE   Final   Culture  Setup Time     Final   Value: 09/24/2013 00:15     Performed at [place]     Final   Value: NO GROWTH     Performed at [facility]  Final   Value: NO GROWTH     Performed at [facility]   Report Status 09/25/2013 FINAL   Final  CULTURE, BLOOD (ROUTINE X 2)     Status: None   Collection Time    09/23/13  3:55 PM      Result Value Ref Range Status   Specimen Description Blood   Final   Special Requests NONE   Final   Culture NO GROWTH 5 DAYS   Final   Report Status 09/28/2013 FINAL   Final  CULTURE, BLOOD (ROUTINE X 2)     Status: None   Collection Time    09/23/13  4:02 PM      Result Value Ref Range Status   Specimen Description Blood RIGHT HAND   Final   Special Requests     Final   Value: BOTTLES DRAWN AEROBIC AND ANAEROBIC 5 CC EACH BOTTLE   Culture NO GROWTH 5 DAYS   Final   Report Status 09/28/2013 FINAL   Final  MRSA PCR SCREENING     Status: None   Collection Time    09/24/13 12:15 AM      Result Value Ref Range Status   MRSA by PCR NEGATIVE  NEGATIVE Final   Comment:            The GeneXpert MRSA Assay (FDA     approved for NASAL specimens     only), is one component of a     comprehensive MRSA colonization     surveillance program. It is not     intended to diagnose MRSA     infection nor to guide or     monitor treatment for     MRSA infections.     Scheduled Meds: . aspirin EC  325 mg Oral Daily  . atorvastatin  20 mg Oral q1800  . docusate sodium  100 mg Oral Daily  . escitalopram  10 mg Oral Daily  . fentaNYL  75 mcg Transdermal Q72H  . gabapentin  300 mg Oral TID  . hydrochlorothiazide  12.5 mg Oral Daily  . insulin aspart  0-15 Units Subcutaneous TID WC  . insulin aspart  0-5 Units Subcutaneous QHS  . insulin aspart  3 Units  Subcutaneous TID WC  . insulin glargine  15 Units Subcutaneous QHS  . levofloxacin  750 mg Oral Daily  . lisinopril  20 mg Oral Daily  . meloxicam  15 mg Oral Daily  . pantoprazole  40 mg Oral Daily  . potassium chloride  40 mEq Oral Daily  . silver sulfADIAZINE   Topical Daily  . sodium chloride  10-40 mL Intracatheter Q12H  . vancomycin  1,500 mg Intravenous Q12H   Continuous Infusions:   Time < 30 minutes  Barton Dubois, MD  [facility] Pager [phone]  If 7PM-7AM, please contact night-coverage [email or web address] Password [email or web address] 09/30/2013, 2:41 PM   LOS: 7 days

## 2013-09-30 NOTE — Progress Notes (Signed)
Inpatient Diabetes Program Recommendations  AACE/ADA: New Consensus Statement on Inpatient Glycemic Control (2013)  Target Ranges:  Prepandial:   less than 140 mg/dL      Peak postprandial:   less than 180 mg/dL (1-2 hours)      Critically ill patients:  140 - 180 mg/dL    Inpatient Diabetes Program Recommendations Correction (SSI): Please consider increase to resistant OR  Insulin - Meal Coverage: Please consider increase to  6 units tidwc. HgbA1C: xxxx  HgbA1C of 7.6%. Thank you, Lenor CoffinAnn [name], RN, CNS, Diabetes Coordinator [phone])

## 2013-10-01 LAB — BASIC METABOLIC PANEL
BUN: 5 mg/dL — ABNORMAL LOW (ref 6–23)
CHLORIDE: 102 meq/L (ref 96–112)
CO2: 26 meq/L (ref 19–32)
CREATININE: 0.37 mg/dL — AB (ref 0.50–1.10)
Calcium: 8 mg/dL — ABNORMAL LOW (ref 8.4–10.5)
GFR calc Af Amer: >90 mL/min (ref >90)
GFR calc non Af Amer: >90 mL/min (ref >90)
GLUCOSE: 212 mg/dL — AB (ref 70–99)
Potassium: 3.7 mEq/L (ref 3.7–5.3)
Sodium: 141 mEq/L (ref 137–147)

## 2013-10-01 LAB — GLUCOSE, CAPILLARY
GLUCOSE-CAPILLARY: 224 mg/dL — AB (ref 70–99)
GLUCOSE-CAPILLARY: 204 mg/dL — AB (ref 70–99)
GLUCOSE-CAPILLARY: 205 mg/dL — AB (ref 70–99)
Glucose-Capillary: 158 mg/dL — ABNORMAL HIGH (ref 70–99)

## 2013-10-01 MED ORDER — GLIMEPIRIDE 2 MG PO TABS
2.0000 mg | ORAL_TABLET | Freq: Two times a day (BID) | ORAL | Status: DC
  Administered 2013-10-01 – 2013-10-03 (×4): 2 mg via ORAL
  Filled 2013-10-01 (×6): qty 1

## 2013-10-01 MED ORDER — GLIMEPIRIDE 2 MG PO TABS
2.0000 mg | ORAL_TABLET | Freq: Every day | ORAL | Status: DC
  Filled 2013-10-01: qty 1

## 2013-10-01 NOTE — Progress Notes (Signed)
PROGRESS NOTE  [name] JJK:[id number] DOB: 27-Jun-1955 DOA: 09/23/2013 PCP: Leonides Grills, MD  Interim history  58 year old female with a history of diabetes mellitus, hypertension, peripheral vascular disease with a nonhealing right medial malleoli wound presented to the ED at [facility] after the patient's son noted her to be more confused on the day of admission. The patient had been going to the wound care center at [facility], but her right malleolus wound has not made significant progress. The patient had a right common iliac angioplasty and stent placed on 08/15/2013 by Dr. Scot Dock. The patient has not been on any antibiotics recently. In the ED, the patient was noted to be febrile with temperature 100.21F, heart rate 120s, WBC 19.1. She was transferred to [facility] for definitive care vascular surgery as well as antibiotics for her wound. MRI of the right lower extremity showed osteomyelitis of the right ankle with myositis. The patient was continued on vancomycin and cefepime. WBC count has improved to 5.5. The patient has defervescence. Orthopedic surgery was consulted, but there are no definitive plans for debridement at this time. A PICC line was placed with the intention of 6 weeks of intravenous antibiotics.   Assessment/Plan: Sepsis secondary to diabetic foot ulcer -sepsis resolved -no further fever and normal WBC's now. -Secondary to diabetic foot infection/osteomyelits on the right foot -Chest x-ray and urinalysis negative on admission. Patient denies cough, chest pain, shortness of breath, dysuria -Continue now on IV vancomycin and PO levaquin and complete a total of 6 weeks antibiotics. -Blood cultures has remained negative  -will continue to follow up with ortho for further rec's regarding wound care and needed surgical interventions -plan is to complete a total of 6 weeks of IV antibiotics and Levaquin.  Diabetic foot infection with presumptive right ankle osteomyelitis   -positive probe to bone test on exam -MRI foot--shows osteomyelitis, myositis  -Continue broad spectrum antibiotics for now; as mentioned above plan is for 6 weeks abx's therapy with vancomycin and PO levaquin -will follow ortho rec's -ESR--70   Atherosclerosis of native arteries of the extremities with ulceration, PVD  -s/p revascularization on 09/27/13 -will follow vascular service rec's -As slowly continued to improve  Tobacco abuse  -This is contributing to her poor wound healing  -Tobacco cessation discussed   Hypertension  -Continue HCTZ and lisinopril  -BP stable and well controlled  DM, with complications of neuropathy and vasculopathy  -Hemoglobin A1c 7.6  -Patient experiencing some hyperglycemia (CBG's > 200) -Continue Lantus 15 units QHS and restart amaryl BID  -continue NovoLog sliding-scale -Continue carb modify diet -Follow CBG's  Hyperlipidemia  -Continue statin   Family Communication: no family at beside  Disposition Plan: Home with home health services when medically stable   Procedures/Studies:  CXR 09/23/2013 No active cardiopulmonary disease.  Right ankle XRAY 09/23/2013 Soft tissue changes without acute bony abnormality.  Right foot CT 09/23/2013 Diffuse prominent soft tissue swelling is present. Soft tissue ulceration over the medial malleolus appears to be present. Bony erosion in the region of the medial malleolus cannot be excluded and osteomyelitis suspected. Confirmation with MRI and/or triphasic bone scan can be obtained.   Antibiotics:  Vancomycin 4/3 -->  Maxipime 4/3 --> 4/9   Procedures/Studies: Dg Chest 2 View  09/23/2013   CLINICAL DATA:  Fever and weakness  EXAM: CHEST  2 VIEW  COMPARISON:  07/10/2011  FINDINGS: The heart size and mediastinal contours are within normal limits. Both lungs  are clear. The visualized skeletal structures are unremarkable.  IMPRESSION: No active cardiopulmonary disease.   Electronically Signed   By: Inez Catalina  M.D.   On: 09/23/2013 17:19   Dg Ankle Complete Right  09/23/2013   CLINICAL DATA:  Right ankle sore  EXAM: RIGHT ANKLE - COMPLETE 3+ VIEW  COMPARISON:  None.  FINDINGS: Changes are noted consistent with a soft tissue wound. No underlying bony abnormality is seen. No bony erosion is noted. Calcaneal spurs are seen.  IMPRESSION: Soft tissue changes without acute bony abnormality.   Electronically Signed   By: Inez Catalina M.D.   On: 09/23/2013 17:19   Ct Foot Right W Contrast  09/23/2013   CLINICAL DATA:  Right ankle ulcer.  Evaluate for osteomyelitis.  EXAM: CT OF THE RIGHT FOOT WITH CONTRAST  TECHNIQUE: Multidetector CT imaging was performed following the standard protocol during bolus administration of intravenous contrast.  CONTRAST:  16mL OMNIPAQUE IOHEXOL 300 MG/ML  SOLN  COMPARISON:  DG ANKLE COMPLETE*R* dated 09/23/2013  FINDINGS: Motion artifact is present. Prominent soft tissue swelling is present about the ankle. Soft tissue ulceration appears to be present in this region. Bony erosion in the region of the medial malleolus cannot be excluded and osteomyelitis is suspected. Confirmation with MRI and/or triphasic bone scan can be obtained. Degenerative change present.  IMPRESSION: Diffuse prominent soft tissue swelling is present. Soft tissue ulceration over the medial malleolus appears to be present. Bony erosion in the region of the medial malleolus cannot be excluded and osteomyelitis suspected. Confirmation with MRI and/or triphasic bone scan can be obtained.   Electronically Signed   By: Marcello Moores  Register   On: 09/23/2013 19:40   Mr Foot Right W Wo Contrast  09/25/2013   CLINICAL DATA:  Osteomyelitis.  Medial ankle ulcer  EXAM: MRI OF THE RIGHT FOREFOOT WITHOUT AND WITH CONTRAST  TECHNIQUE: Multiplanar, multisequence MR imaging was performed both before and after administration of intravenous contrast.  CONTRAST:  61mL MULTIHANCE GADOBENATE DIMEGLUMINE 529 MG/ML IV SOLN  COMPARISON:  CT scan  09/23/2013  FINDINGS: As demonstrated on the CT scan there is a medial ankle ulceration which appears to grow right down to the bone. The underlying cortex was abnormal on the CT scan and there is mild edema and enhancement on the MRI consistent with cortical and subcortical osteomyelitis. No focal drainable soft tissue abscess. There is diffuse cellulitis and myositis but no findings for pyomyositis or septic arthritis.  The medial and lateral ankle tendons are intact. The posterior tibialis tendon is running right through the infected area and is very attenuated. The Achilles tendon and plantar fascia are intact. Moderate ankle, midfoot and hindfoot degenerative changes.  IMPRESSION: 1. 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 2. Cellulitis and myofasciitis but no findings for discrete drainable abscess or pyomyositis. 3. The posterior tibialis tendon is likely involved with the infection and is very attenuated.   Electronically Signed   By: Kalman Jewels M.D.   On: 09/25/2013 16:52   Dg Chest Port 1 View  09/25/2013   CLINICAL DATA:  Right PICC line  EXAM: PORTABLE CHEST - 1 VIEW  COMPARISON:  09/23/2013  FINDINGS: Cardiomediastinal silhouette is stable. Right arm PICC line in place with tip distal SVC. No pneumothorax. No acute infiltrate or pulmonary edema.  IMPRESSION: Right PICC line in place.  No pneumothorax.   Electronically Signed   By: Orlean Bradford.D.  On: 09/25/2013 18:12    Subjective:   Pt is alert and in no acute distress, follows commands appropriately, reports mild pain and swelling on her legs (R > L)  Objective: Filed Vitals:   09/30/13 1315 09/30/13 1935 10/01/13 0419 10/01/13 1417  BP: 114/47 136/45 135/49 132/60  Pulse: 75 72 73 75  Temp: 98.5 F (36.9 C) 98.4 F (36.9 C) 97.9 F (36.6 C) 98.4 F (36.9 C)  TempSrc: Oral Oral Oral Oral  Resp: $Remo'20 18 16 18  'rCkwV$ Height:      Weight:      SpO2: 100%  100% 97% 98%    Intake/Output Summary (Last 24 hours) at 10/01/13 1558 Last data filed at 10/01/13 1355  Gross per 24 hour  Intake   4320 ml  Output   1725 ml  Net   2595 ml   Weight change:  Exam:   General:  Pt is alert and in no acute distress, follows commands appropriately, reports mild pain and swelling on her legs (R > L)  HEENT: No icterus, No thrush,  [place]/AT  Cardiovascular: RRR, S1/S2, no rubs, no gallops  Respiratory: CTA bilaterally, no wheezing, no crackles, no rhonchi  Abdomen: Soft/+BS, non tender, non distended, no guarding  Extremities: BLE varicose ulcer (right bigger than left); RLE s/p fem/pop bypass, drain still in place, no significant suppuration and no redness on her wound. Slight tenderness. BLE swelling trace to 1-2 ++   Data Reviewed: Basic Metabolic Panel:  Recent Labs Lab 09/27/13 0508 09/28/13 0350 09/29/13 0615 09/29/13 1115 10/01/13 0540  NA 141 141 141 143 141  K 3.4* 3.7 3.1* 3.1* 3.7  CL 102 104 102 104 102  CO2 $Re'27 26 25 26 26  'fYd$ GLUCOSE 185* 169* 175* 240* 212*  BUN 9 8 5* 5* 5*  CREATININE 0.35* 0.38* 0.37* 0.33* 0.37*  CALCIUM 8.1* 7.6* 8.0* 8.0* 8.0*   Liver Function Tests:  Recent Labs Lab 09/29/13 1115  AST 10  ALT 7  ALKPHOS 48  BILITOT <0.2*  PROT 5.2*  ALBUMIN 2.1*   CBC:  Recent Labs Lab 09/26/13 0500 09/27/13 0508 09/28/13 0350 09/29/13 0615 09/29/13 1115  WBC 6.8 5.5 9.8 7.2 7.5  HGB 13.6 13.6 10.0* 9.3* 9.1*  HCT 39.9 40.0 29.7* 27.2* 26.9*  MCV 88.3 88.3 87.6 86.9 87.1  PLT 183 181 197 201 211   CBG:  Recent Labs Lab 09/30/13 1110 09/30/13 1654 09/30/13 2131 10/01/13 0614 10/01/13 1116  GLUCAP 191* 215* 179* 224* 204*    Recent Results (from the past 240 hour(s))  URINE CULTURE     Status: None   Collection Time    09/23/13  3:40 PM      Result Value Ref Range Status   Specimen Description URINE, CATHETERIZED   Final   Special Requests NONE   Final   Culture  Setup Time     Final    Value: 09/24/2013 00:15     Performed at [place]     Final   Value: NO GROWTH     Performed at [facility]   Culture     Final   Value: NO GROWTH     Performed at [facility]   Report Status 09/25/2013 FINAL   Final  CULTURE, BLOOD (ROUTINE X 2)     Status: None   Collection Time    09/23/13  3:55 PM      Result Value Ref Range Status  Specimen Description Blood   Final   Special Requests NONE   Final   Culture NO GROWTH 5 DAYS   Final   Report Status 09/28/2013 FINAL   Final  CULTURE, BLOOD (ROUTINE X 2)     Status: None   Collection Time    09/23/13  4:02 PM      Result Value Ref Range Status   Specimen Description Blood RIGHT HAND   Final   Special Requests     Final   Value: BOTTLES DRAWN AEROBIC AND ANAEROBIC 5 CC EACH BOTTLE   Culture NO GROWTH 5 DAYS   Final   Report Status 09/28/2013 FINAL   Final  MRSA PCR SCREENING     Status: None   Collection Time    09/24/13 12:15 AM      Result Value Ref Range Status   MRSA by PCR NEGATIVE  NEGATIVE Final   Comment:            The GeneXpert MRSA Assay (FDA     approved for NASAL specimens     only), is one component of a     comprehensive MRSA colonization     surveillance program. It is not     intended to diagnose MRSA     infection nor to guide or     monitor treatment for     MRSA infections.     Scheduled Meds: . aspirin EC  325 mg Oral Daily  . atorvastatin  20 mg Oral q1800  . docusate sodium  100 mg Oral Daily  . escitalopram  10 mg Oral Daily  . fentaNYL  75 mcg Transdermal Q72H  . gabapentin  300 mg Oral TID  . glimepiride  2 mg Oral BID  . hydrochlorothiazide  12.5 mg Oral Daily  . insulin aspart  0-15 Units Subcutaneous TID WC  . insulin aspart  0-5 Units Subcutaneous QHS  . insulin glargine  15 Units Subcutaneous QHS  . levofloxacin  750 mg Oral Daily  . lisinopril  20 mg Oral Daily  . meloxicam  15 mg Oral Daily  . pantoprazole  40 mg Oral Daily  .  potassium chloride  40 mEq Oral Daily  . silver sulfADIAZINE   Topical Daily  . sodium chloride  10-40 mL Intracatheter Q12H  . vancomycin  1,500 mg Intravenous Q12H   Continuous Infusions:   Time < 30 minutes  Barton Dubois, MD  [facility] Pager [phone]  If 7PM-7AM, please contact night-coverage [email or web address] Password [email or web address] 10/01/2013, 3:58 PM   LOS: 8 days

## 2013-10-01 NOTE — Op Note (Signed)
Patient name: Leodis RainsCheryl C Locascio MRN: [id number] DOB: [birth year] Sex: female  09/23/2013 - 09/27/2013 Pre-operative Diagnosis: Right leg ulcer Post-operative diagnosis:  Same Surgeon:  Nada LibmanVance W Brabham Assistants:  S. Rhyne Procedure:   Right Femoral TP Trunk Bypass with ipsilateral reversed greater saphenous vein Anesthesia:  General Blood Loss:  See anesthesia record Specimens:  none  Findings:  Great diameter vein, but very friable.  Venous hypertension in leg with significant lymphatic drainage requiring drain placement in vein harvest tract.  TP trunk was calcified.  I elected to goto this target to avoid an incision on the patients thickened edematous skin in her lower leg.  An incisional VAC was placed in the groin.  Indications:  The patient has bilateral lower extremity ulcers.  She has early osteomyelitis in the right leg ulcer in the medial malleolus.  She has previously undergone angiographic evaluation which revealed an occluded femoral-popliteal artery.  She comes in for bypass graft for limb salvage.  Procedure:  The patient was identified in the holding area and [facility]  The patient was then placed supine on the table. general anesthesia was administered.  The patient was prepped and draped in the usual sterile fashion.  A time out was called and antibiotics were administered.  The saphenous vein was evaluated with ultrasound on the right leg.  This was an excellent diameter vein ranging from 6-7 mm.  It was marked with ink pen on the skin surface.  A longitudinal incision was made in the right groin.  Cautery was used to divide the subcutaneous tissue.  The patient had significant lymphatic drainage.  The femoral sheath was identified and then opened sharply.  Panniculus retraction was required to expose the inguinal ligaments.  I was able to dissect out the common femoral artery under the inguinal ligament.  This was a difficult exposure given her pannus.  Ultimately I  was able to isolate the common femoral, superficial femoral, and profunda femoral artery.  There was significant posterior plaque within the common femoral artery.  Next, the saphenous vein was identified and dissected free down to the saphenofemoral junction.  Side branches were ligated between silk ties.  The remainder of the saphenous vein was dissected out through skip incisions going down the leg.  Again side branches were divided between silk ties.  I made a medial incision below the knee.  The soleus muscle was taken down from the tibia.  I identified the tibioperoneal trunk.  This was a somewhat calcified artery.  It was fully exposed.  I also harvested the saphenous vein through this incision.  Next, a tunnel was created through the popliteal space in a subsartorial plane.  I then oversewed the saphenofemoral junction with 5-0 Prolene in 2 layers.  A 2-0 silk was placed on the distal saphenous vein the saphenous vein was removed.  The vein was very friable.  There were several areas that required 7-0 Prolene sutures for hemostasis.  The vein distended nicely to approximately 7-8 mm.  Because of the friable nature of the vein I did not want to use a valvulotome, and therefore I elected to place this in a reverse fashion.  The patient was then fully heparinized.  The common femoral, profundofemoral common superficial femoral artery were then occluded.  A #11 blade was used to make an arteriotomy in the distal common femoral artery.  The vein was then spatulated to fit the size of the arteriotomy, and a running anastomosis  was created with 5-0 Prolene.  Once the anastomosis was completed the clamps were released.  There was excellent pulsatile flow through the vein which had been previously marked for orientation.  The vein was then brought through the previous the created tunnel.  A tourniquet was placed on the upper thigh.  The leg was exsanguinated with an Esmarch.  The tourniquet was inflated to 250 mm  mercury a #11 blade was used to make an arteriotomy in the tibioperoneal trunk.  This was extended proximally and distally with Potts scissors.  The vein was then cut to the appropriate length and spatulated.  The anastomosis was created with a running 6-0 Prolene.  Prior to completion, the tourniquet was let down.  Appropriate flushing maneuvers was performed.  The anastomosis was then completed.  The patient had good Doppler signals in the dorsalis pedis and posterior tibial artery that were graft dependent.  The patient's was then reversed with 50 mg of protamine.  Because of the lymphatic oozing from all tissues, I elected to place a 15 Blake drain in the vein harvest site which was brought out through a stab incision and sewn into place with 2-0 silk.  Once hemostasis was adequate.  The vein harvest incisions were closed with 3-0 Vicryl in 2 layers.  The below knee incision was closed by reapproximating the fascia with 2-0 Vicryl.  The subcutaneous tissue was closed with 3-0 Vicryl and the skin with 4-0 Vicryl.  The groin incision was closed by reapproximating the femoral sheath with 2-0 Vicryl.  The groin was then closed in multiple layers of 2 and 3-0 Vicryl followed by skin closure with 4-0 Vicryl.  I elected to place an incisional VAC in the groin.  Dermabond was placed on the other incisions.  There were no immediate complications.   Disposition:  To PACU in stable condition.   Juleen China, M.D. [facility] Office: [phone] Pager:  [phone]

## 2013-10-01 NOTE — Progress Notes (Addendum)
[facility]  Subjective  - Walked a little yesterday.  Doing ok over all.   Objective 135/49 73 97.9 F (36.6 C) (Oral) 16 97%  Intake/Output Summary (Last 24 hours) at 10/01/13 [id number] Last data filed at 09/30/13 2217  Gross per 24 hour  Intake   3340 ml  Output   2005 ml  Net   1335 ml    Right foot wounds medial malleolus new wet to dry dressing changed today. Leg warm to touch, wound vac in place over groin incision JP drain out put 155 SS/lyphatic drainage Wound vac in place   Assessment/Planning: POD #4 BYPASS GRAFT FEMORAL-TIBIAL ARTERY-RIGHT (Right)   Continue JP, wound vac, and IV antibiotics. Ambulate     [name] 10/01/2013 9:22 AM --  Laboratory Lab Results:  Recent Labs  09/29/13 0615 09/29/13 1115  WBC 7.2 7.5  HGB 9.3* 9.1*  HCT 27.2* 26.9*  PLT 201 211   BMET  Recent Labs  09/29/13 1115 10/01/13 0540  NA 143 141  K 3.1* 3.7  CL 104 102  CO2 26 26  GLUCOSE 240* 212*  BUN 5* 5*  CREATININE 0.33* 0.37*  CALCIUM 8.0* 8.0*    COAG No results found for this basename: INR, PROTIME   No results found for this basename: PTT   Agree with above assessment Ulceration medial malleolus examined today-remains clean proximately 3 x 4 cm in diameter Significant lymphatic drainage from JP      wound VAC in place right inguinal area  Plan continue ambulation and local wound care. Will leave Jackson-Pratt drain in place [name] to side on disposition regarding wound VAC on Monday

## 2013-10-02 DIAGNOSIS — E876 Hypokalemia: Secondary | ICD-10-CM

## 2013-10-02 LAB — GLUCOSE, CAPILLARY
GLUCOSE-CAPILLARY: 151 mg/dL — AB (ref 70–99)
GLUCOSE-CAPILLARY: 192 mg/dL — AB (ref 70–99)
GLUCOSE-CAPILLARY: 176 mg/dL — AB (ref 70–99)
Glucose-Capillary: 199 mg/dL — ABNORMAL HIGH (ref 70–99)

## 2013-10-02 NOTE — Progress Notes (Signed)
Pt amb 300 ft pushing rolling walker. Pt with no complaints. Will continue to monitor pt closely.

## 2013-10-02 NOTE — Progress Notes (Signed)
Patient ID: Kaitlin RainsCheryl C Siever, female   DOB: 1956-04-19, 58 y.o.   MRN: [id number] Vascular Surgery Progress Note  Subjective: Post femoral to tibial bypass graft by [name] with right inguinal wound VAC in place. Jackson-Pratt drain remains in distal thigh wound with continued significant serous drainage although it is decreasing  Objective:  Filed Vitals:   10/02/13 0422  BP: 157/68  Pulse: 80  Temp: 97.5 F (36.4 C)  Resp: 18    General alert and oriented x3 in no apparent stress Dressings intact Ulceration right medial malleolus with scheduled dressing changes-bypass patent   Labs:  Recent Labs Lab 09/29/13 0615 09/29/13 1115 10/01/13 0540  CREATININE 0.37* 0.33* 0.37*    Recent Labs Lab 09/29/13 0615 09/29/13 1115 10/01/13 0540  NA 141 143 141  K 3.1* 3.1* 3.7  CL 102 104 102  CO2 25 26 26   BUN 5* 5* 5*  CREATININE 0.37* 0.33* 0.37*  GLUCOSE 175* 240* 212*  CALCIUM 8.0* 8.0* 8.0*    Recent Labs Lab 09/28/13 0350 09/29/13 0615 09/29/13 1115  WBC 9.8 7.2 7.5  HGB 10.0* 9.3* 9.1*  HCT 29.7* 27.2* 26.9*  PLT 197 201 211   No results found for this basename: INR,  in the last 168 hours  I/O last 3 completed shifts: In: 4080 [P.O.:1080; IV Piggyback:3000] Out: 1765 [Urine:1500; Drains:265]  Imaging: No results found.  Assessment/Plan:   LOS: 9 days  s/p Procedure(s): BYPASS GRAFT FEMORAL-TIBIAL ARTERY-RIGHT  Plan to leave a JP drain in place today Continue to increase ambulation which she did multiple times yesterday [name] to decide about right inguinal wound VAC in a.m. and to determine timing of discharge  Currently patient is ambulating well   Josephina GipJames Lawson, MD 10/02/2013 9:29 AM

## 2013-10-02 NOTE — Progress Notes (Signed)
PROGRESS NOTE  [name] CVE:[id number] DOB: 04-21-1956 DOA: 09/23/2013 PCP: Leonides Grills, MD  Interim history  58 year old female with a history of diabetes mellitus, hypertension, peripheral vascular disease with a nonhealing right medial malleoli wound presented to the ED at [facility] after the patient's son noted her to be more confused on the day of admission. The patient had been going to the wound care center at [facility], but her right malleolus wound has not made significant progress. The patient had a right common iliac angioplasty and stent placed on 08/15/2013 by Dr. Scot Dock. The patient has not been on any antibiotics recently. In the ED, the patient was noted to be febrile with temperature 100.23F, heart rate 120s, WBC 19.1. She was transferred to [facility] for definitive care vascular surgery as well as antibiotics for her wound. MRI of the right lower extremity showed osteomyelitis of the right ankle with myositis. The patient was continued on vancomycin and cefepime. WBC count has improved to 5.5. The patient has defervescence. Orthopedic surgery was consulted, but there are no definitive plans for debridement at this time. A PICC line was placed with the intention of 6 weeks of intravenous antibiotics.   Assessment/Plan: Sepsis secondary to diabetic foot ulcer -sepsis resolved -no further fever and normal WBC's now. -Secondary to diabetic foot infection/osteomyelits on the right foot -Chest x-ray and urinalysis negative on admission. Patient denies cough, chest pain, shortness of breath, dysuria -Continue now on IV vancomycin and PO levaquin and complete a total of 6 weeks antibiotics. -Blood cultures has remained negative  -will continue to follow up with ortho for further rec's regarding wound care and needed surgical interventions -plan is to complete a total of 6 weeks of IV antibiotics and Levaquin.  Diabetic foot infection with presumptive right ankle osteomyelitis   -positive probe to bone test on exam -MRI foot--shows osteomyelitis, myositis  -Continue broad spectrum antibiotics for now; as mentioned above plan is for 6 weeks abx's therapy with vancomycin and PO levaquin -will follow ortho rec's -ESR--70   Atherosclerosis of native arteries of the extremities with ulceration, PVD  -s/p revascularization on 09/27/13 -will follow vascular service rec's -As slowly continued to improve  Tobacco abuse  -This is contributing to her poor wound healing  -Tobacco cessation discussed   Hypertension  -Continue HCTZ and lisinopril  -BP stable and well controlled  DM, with complications of neuropathy and vasculopathy  -Hemoglobin A1c 7.6  -Patient experiencing some hyperglycemia (CBG's < 200) -Continue Lantus 15 units QHS and restart amaryl BID  -continue NovoLog sliding-scale -Continue carb modify diet -Follow CBG's  Hypokalemia -repleted -will monitor and replete as needed  hyperlipidemia  -Continue statin   Family Communication: no family at beside  Disposition Plan: Home with home health services when medically stable   Procedures/Studies:  CXR 09/23/2013 No active cardiopulmonary disease.  Right ankle XRAY 09/23/2013 Soft tissue changes without acute bony abnormality.  Right foot CT 09/23/2013 Diffuse prominent soft tissue swelling is present. Soft tissue ulceration over the medial malleolus appears to be present. Bony erosion in the region of the medial malleolus cannot be excluded and osteomyelitis suspected. Confirmation with MRI and/or triphasic bone scan can be obtained.   Antibiotics:  Vancomycin 4/3 -->  Maxipime 4/3 --> 4/9   Procedures/Studies: Dg Chest 2 View  09/23/2013   CLINICAL DATA:  Fever and weakness  EXAM: CHEST  2 VIEW  COMPARISON:  07/10/2011  FINDINGS: The heart size  and mediastinal contours are within normal limits. Both lungs are clear. The visualized skeletal structures are unremarkable.  IMPRESSION: No active  cardiopulmonary disease.   Electronically Signed   By: Inez Catalina M.D.   On: 09/23/2013 17:19   Dg Ankle Complete Right  09/23/2013   CLINICAL DATA:  Right ankle sore  EXAM: RIGHT ANKLE - COMPLETE 3+ VIEW  COMPARISON:  None.  FINDINGS: Changes are noted consistent with a soft tissue wound. No underlying bony abnormality is seen. No bony erosion is noted. Calcaneal spurs are seen.  IMPRESSION: Soft tissue changes without acute bony abnormality.   Electronically Signed   By: Inez Catalina M.D.   On: 09/23/2013 17:19   Ct Foot Right W Contrast  09/23/2013   CLINICAL DATA:  Right ankle ulcer.  Evaluate for osteomyelitis.  EXAM: CT OF THE RIGHT FOOT WITH CONTRAST  TECHNIQUE: Multidetector CT imaging was performed following the standard protocol during bolus administration of intravenous contrast.  CONTRAST:  35mL OMNIPAQUE IOHEXOL 300 MG/ML  SOLN  COMPARISON:  DG ANKLE COMPLETE*R* dated 09/23/2013  FINDINGS: Motion artifact is present. Prominent soft tissue swelling is present about the ankle. Soft tissue ulceration appears to be present in this region. Bony erosion in the region of the medial malleolus cannot be excluded and osteomyelitis is suspected. Confirmation with MRI and/or triphasic bone scan can be obtained. Degenerative change present.  IMPRESSION: Diffuse prominent soft tissue swelling is present. Soft tissue ulceration over the medial malleolus appears to be present. Bony erosion in the region of the medial malleolus cannot be excluded and osteomyelitis suspected. Confirmation with MRI and/or triphasic bone scan can be obtained.   Electronically Signed   By: Marcello Moores  Register   On: 09/23/2013 19:40   Mr Foot Right W Wo Contrast  09/25/2013   CLINICAL DATA:  Osteomyelitis.  Medial ankle ulcer  EXAM: MRI OF THE RIGHT FOREFOOT WITHOUT AND WITH CONTRAST  TECHNIQUE: Multiplanar, multisequence MR imaging was performed both before and after administration of intravenous contrast.  CONTRAST:  81mL MULTIHANCE  GADOBENATE DIMEGLUMINE 529 MG/ML IV SOLN  COMPARISON:  CT scan 09/23/2013  FINDINGS: As demonstrated on the CT scan there is a medial ankle ulceration which appears to grow right down to the bone. The underlying cortex was abnormal on the CT scan and there is mild edema and enhancement on the MRI consistent with cortical and subcortical osteomyelitis. No focal drainable soft tissue abscess. There is diffuse cellulitis and myositis but no findings for pyomyositis or septic arthritis.  The medial and lateral ankle tendons are intact. The posterior tibialis tendon is running right through the infected area and is very attenuated. The Achilles tendon and plantar fascia are intact. Moderate ankle, midfoot and hindfoot degenerative changes.  IMPRESSION: 1. 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 2. Cellulitis and myofasciitis but no findings for discrete drainable abscess or pyomyositis. 3. The posterior tibialis tendon is likely involved with the infection and is very attenuated.   Electronically Signed   By: Kalman Jewels M.D.   On: 09/25/2013 16:52   Dg Chest Port 1 View  09/25/2013   CLINICAL DATA:  Right PICC line  EXAM: PORTABLE CHEST - 1 VIEW  COMPARISON:  09/23/2013  FINDINGS: Cardiomediastinal silhouette is stable. Right arm PICC line in place with tip distal SVC. No pneumothorax. No acute infiltrate or pulmonary edema.  IMPRESSION: Right PICC line in place.  No pneumothorax.   Electronically  Signed   By: Lahoma Crocker M.D.   On: 09/25/2013 18:12    Subjective:   Pt is alert and in no acute distress. Reports no overnight events. JP drainage is decreasing/slowing down.  Objective: Filed Vitals:   10/01/13 0419 10/01/13 1417 10/01/13 2052 10/02/13 0422  BP: 135/49 132/60 130/56 157/68  Pulse: 73 75 68 80  Temp: 97.9 F (36.6 C) 98.4 F (36.9 C) 98.2 F (36.8 C) 97.5 F (36.4 C)  TempSrc: Oral Oral Oral Oral  Resp: $Remo'16  18 18 18  'uaDXD$ Height:      Weight:      SpO2: 97% 98% 100% 99%    Intake/Output Summary (Last 24 hours) at 10/02/13 1305 Last data filed at 10/02/13 0730  Gross per 24 hour  Intake    600 ml  Output   2157 ml  Net  -1557 ml   Weight change:  Exam:   General:  Pt is alert and in no acute distress, follows commands appropriately, reports mild pain and swelling on her legs (R > L)  HEENT: No icterus, No thrush,  [place]/AT  Cardiovascular: RRR, S1/S2, no rubs, no gallops  Respiratory: CTA bilaterally, no wheezing, no crackles, no rhonchi  Abdomen: Soft/+BS, non tender, non distended, no guarding  Extremities: BLE varicose ulcer (right bigger than left); RLE s/p fem/pop bypass, drain still in place, no significant suppuration and no redness on her wound. Slight tenderness. BLE swelling trace to 1-2 ++ (R>L)   Data Reviewed: Basic Metabolic Panel:  Recent Labs Lab 09/27/13 0508 09/28/13 0350 09/29/13 0615 09/29/13 1115 10/01/13 0540  NA 141 141 141 143 141  K 3.4* 3.7 3.1* 3.1* 3.7  CL 102 104 102 104 102  CO2 $Re'27 26 25 26 26  'Wga$ GLUCOSE 185* 169* 175* 240* 212*  BUN 9 8 5* 5* 5*  CREATININE 0.35* 0.38* 0.37* 0.33* 0.37*  CALCIUM 8.1* 7.6* 8.0* 8.0* 8.0*   Liver Function Tests:  Recent Labs Lab 09/29/13 1115  AST 10  ALT 7  ALKPHOS 48  BILITOT <0.2*  PROT 5.2*  ALBUMIN 2.1*   CBC:  Recent Labs Lab 09/26/13 0500 09/27/13 0508 09/28/13 0350 09/29/13 0615 09/29/13 1115  WBC 6.8 5.5 9.8 7.2 7.5  HGB 13.6 13.6 10.0* 9.3* 9.1*  HCT 39.9 40.0 29.7* 27.2* 26.9*  MCV 88.3 88.3 87.6 86.9 87.1  PLT 183 181 197 201 211   CBG:  Recent Labs Lab 10/01/13 1116 10/01/13 1639 10/01/13 2105 10/02/13 0548 10/02/13 1110  GLUCAP 204* 205* 158* 151* 199*    Recent Results (from the past 240 hour(s))  URINE CULTURE     Status: None   Collection Time    09/23/13  3:40 PM      Result Value Ref Range Status   Specimen Description URINE, CATHETERIZED   Final   Special  Requests NONE   Final   Culture  Setup Time     Final   Value: 09/24/2013 00:15     Performed at [facility]     Final   Value: NO GROWTH     Performed at [facility]   Culture     Final   Value: NO GROWTH     Performed at [facility]   Report Status 09/25/2013 FINAL   Final  CULTURE, BLOOD (ROUTINE X 2)     Status: None   Collection Time    09/23/13  3:55 PM  Result Value Ref Range Status   Specimen Description Blood   Final   Special Requests NONE   Final   Culture NO GROWTH 5 DAYS   Final   Report Status 09/28/2013 FINAL   Final  CULTURE, BLOOD (ROUTINE X 2)     Status: None   Collection Time    09/23/13  4:02 PM      Result Value Ref Range Status   Specimen Description Blood RIGHT HAND   Final   Special Requests     Final   Value: BOTTLES DRAWN AEROBIC AND ANAEROBIC 5 CC EACH BOTTLE   Culture NO GROWTH 5 DAYS   Final   Report Status 09/28/2013 FINAL   Final  MRSA PCR SCREENING     Status: None   Collection Time    09/24/13 12:15 AM      Result Value Ref Range Status   MRSA by PCR NEGATIVE  NEGATIVE Final   Comment:            The GeneXpert MRSA Assay (FDA     approved for NASAL specimens     only), is one component of a     comprehensive MRSA colonization     surveillance program. It is not     intended to diagnose MRSA     infection nor to guide or     monitor treatment for     MRSA infections.     Scheduled Meds: . aspirin EC  325 mg Oral Daily  . atorvastatin  20 mg Oral q1800  . docusate sodium  100 mg Oral Daily  . escitalopram  10 mg Oral Daily  . fentaNYL  75 mcg Transdermal Q72H  . gabapentin  300 mg Oral TID  . glimepiride  2 mg Oral BID AC  . hydrochlorothiazide  12.5 mg Oral Daily  . insulin aspart  0-15 Units Subcutaneous TID WC  . insulin aspart  0-5 Units Subcutaneous QHS  . insulin glargine  15 Units Subcutaneous QHS  . levofloxacin  750 mg Oral Daily  . lisinopril  20 mg Oral Daily  .  meloxicam  15 mg Oral Daily  . pantoprazole  40 mg Oral Daily  . potassium chloride  40 mEq Oral Daily  . silver sulfADIAZINE   Topical Daily  . sodium chloride  10-40 mL Intracatheter Q12H  . vancomycin  1,500 mg Intravenous Q12H   Continuous Infusions:   Time < 30 minutes  Barton Dubois, MD  [facility] Pager [phone]  If 7PM-7AM, please contact night-coverage [email or web address] Password [email or web address] 10/02/2013, 1:05 PM   LOS: 9 days

## 2013-10-03 DIAGNOSIS — I10 Essential (primary) hypertension: Secondary | ICD-10-CM

## 2013-10-03 LAB — BASIC METABOLIC PANEL
BUN: 6 mg/dL (ref 6–23)
CO2: 29 mEq/L (ref 19–32)
Calcium: 8.4 mg/dL (ref 8.4–10.5)
Chloride: 103 mEq/L (ref 96–112)
Creatinine, Ser: 0.45 mg/dL — ABNORMAL LOW (ref 0.50–1.10)
Glucose, Bld: 168 mg/dL — ABNORMAL HIGH (ref 70–99)
POTASSIUM: 4.2 meq/L (ref 3.7–5.3)
SODIUM: 143 meq/L (ref 137–147)

## 2013-10-03 LAB — GLUCOSE, CAPILLARY
GLUCOSE-CAPILLARY: 168 mg/dL — AB (ref 70–99)
Glucose-Capillary: 198 mg/dL — ABNORMAL HIGH (ref 70–99)

## 2013-10-03 MED ORDER — SILVER SULFADIAZINE 1 % EX CREA: TOPICAL_CREAM | Freq: Every day | CUTANEOUS | Status: DC

## 2013-10-03 MED ORDER — LEVOFLOXACIN 750 MG PO TABS: 750.0000 mg | ORAL_TABLET | Freq: Every day | ORAL | Status: AC

## 2013-10-03 MED ORDER — HEPARIN SOD (PORK) LOCK FLUSH 100 UNIT/ML IV SOLN
250.0000 [IU] | INTRAVENOUS | Status: AC | PRN
  Administered 2013-10-03: 250 [IU]

## 2013-10-03 MED ORDER — VANCOMYCIN HCL 10 G IV SOLR: 1500.0000 mg | Freq: Two times a day (BID) | INTRAVENOUS | Status: DC

## 2013-10-03 NOTE — Progress Notes (Signed)
[facility]  Subjective  - Doing well over all wants to go home.   Objective 179/62 70 98.1 F (36.7 C) (Oral) 18 99%  Intake/Output Summary (Last 24 hours) at 10/03/13 0848 Last data filed at 10/03/13 [id number]  Gross per 24 hour  Intake    480 ml  Output   2680 ml  Net  -2200 ml    Right groin wound vac in place no out put to date Right medial malleolus ulceration without change, dressing changes daily Right LE warm to touch JP drain 80 cc total output yesterday  Assessment/Planning: POD #6 BYPASS GRAFT FEMORAL-TIBIAL ARTERY-RIGHT (Right)   Plan for discharge per Dr. Estevan RyderBrabham    [name] 10/03/2013 8:48 AM --  Laboratory Lab Results: No results found for this basename: WBC, HGB, HCT, PLT,  in the last 72 hours BMET  Recent Labs  10/01/13 0540 10/03/13 0500  NA 141 143  K 3.7 4.2  CL 102 103  CO2 26 29  GLUCOSE 212* 168*  BUN 5* 6  CREATININE 0.37* 0.45*  CALCIUM 8.0* 8.4    COAG No results found for this basename: INR, PROTIME   No results found for this basename: PTT

## 2013-10-03 NOTE — Progress Notes (Signed)
Physical Therapy Treatment Patient Details Name: [name] MRN: [id number] DOB: [birth year] Today's Date: 10/03/2013    History of Present Illness 58 y/o female s/p BYPASS GRAFT FEMORAL-TIBIAL ARTERY-RIGHT (Right)    PT Comments    Pt has met initial goals met for supervision level mobility and new goals set for mod I level as pt approaches discharge. Worked on higher level gait activities today such as changing speed and direction. This is a challenge for pt. PT will continue to follow.   Follow Up Recommendations  Home health PT;Supervision/Assistance - 24 hour     Equipment Recommendations  3in1 (PT)    Recommendations for Other Services       Precautions / Restrictions Precautions Precautions: Fall Restrictions Weight Bearing Restrictions: No Other Position/Activity Restrictions: pt with wound vac and JP drain    Mobility  Bed Mobility Overal bed mobility: Modified Independent Bed Mobility: Supine to Sit     Supine to sit: Modified independent (Device/Increase time)        Transfers Overall transfer level: Needs assistance Equipment used: Rolling walker (2 wheeled) Transfers: Sit to/from [facility] Sit to Stand: Supervision Stand pivot transfers: Supervision       General transfer comment: pt stands safely but needed vc's to keep RW with her with SPT, discussed fall prevention  Ambulation/Gait Ambulation/Gait assistance: Supervision Ambulation Distance (Feet): 300 Feet Assistive device: Rolling walker (2 wheeled) Gait Pattern/deviations: Trunk flexed;Decreased stride length Gait velocity: decreased   General Gait Details: vc's for upright posture. Practiced changing speeds which pt is able to do only minimally. Also practiced bkwd and sideways walking with RW. Pt had difficulty backing at first, with need to lighten pressure off back posts of RW for them to slide, but improved with practice   Stairs            Wheelchair  Mobility    Modified Rankin (Stroke Patients Only)       Balance Overall balance assessment: Needs assistance         Standing balance support: Bilateral upper extremity supported Standing balance-Leahy Scale: Fair Standing balance comment: pt can stand without UE support in static situation but requires UE support for dynamic activity                    Cognition Arousal/Alertness: Awake/alert Behavior During Therapy: WFL for tasks assessed/performed Overall Cognitive Status: Within Functional Limits for tasks assessed                      Exercises General Exercises - Lower Extremity Hip Flexion/Marching: AROM;Both;10 reps;Standing Mini-Sqauts: AROM;5 reps;Standing    General Comments        Pertinent Vitals/Pain Pt reports no pain, just "soreness" which she does not rate, bilateral LEs    Home Living                      Prior Function            PT Goals (current goals can now be found in the care plan section) Acute Rehab PT Goals Patient Stated Goal: go home PT Goal Formulation: With patient Time For Goal Achievement: 10/17/13 Potential to Achieve Goals: Good Progress towards PT goals: Goals met and updated - see care plan    Frequency  Min 3X/week    PT Plan Current plan remains appropriate    Co-evaluation             End of  Session Equipment Utilized During Treatment: Gait belt Activity Tolerance: Patient tolerated treatment well Patient left: in chair;with call bell/phone within reach     Time: 1029-1056 PT Time Calculation (min): 27 min  Charges:  $Gait Training: 23-37 mins                    G Codes:     Leighton Roach, PT  Acute Rehab Services  [place] 10/03/2013, 11:56 AM

## 2013-10-03 NOTE — Progress Notes (Signed)
Discharged to home via wheelchair to private vehicle to home accompanied by son, med reconc and followup reviewed with patient via teachback, verbalizes understanding, rt foot redressed per order, advanced home care to follow for IV antibiotics, Berle MullLynn Kharis Lapenna RN

## 2013-10-03 NOTE — Discharge Summary (Signed)
Physician Discharge Summary  [name] BRA:[id number] DOB: 1956-04-25 DOA: 09/23/2013  PCP: Leonides Grills, MD  Admit date: 09/23/2013 Discharge date: 10/03/2013  Time spent: >30 minutes  Recommendations for Outpatient Follow-up:  1. BMET and CBC to follow electrolytes, renal function and WBC's/ Hgb trend 2. Reassess BP and adjust medications as needed 3. Follow CBG's and DM's for further adjustments to hypoglycemic regimen   Discharge Diagnoses:  Principal Problem:   Sepsis Active Problems:   Atherosclerosis of native arteries of the extremities with ulceration(440.23)   Venous stasis   Fever   Peripheral vascular disease   Diabetic foot infection   Acute osteomyelitis involving lower leg   Discharge Condition: stable and improved. Will discharge home with [facility] services and IV antibiotics to complete 6 weeks total of antibiotics. Patient will follow with PCP, orthopedics service and also with vascular surgery as an outpatient.  Diet recommendation: low sodium diet and low carb diet  Filed Weights   09/24/13 0500 09/24/13 1724 09/27/13 2052  Weight: 107 kg (235 lb 14.3 oz) 106.55 kg (234 lb 14.4 oz) 107.6 kg (237 lb 3.4 oz)    History of present illness:  58 year old female with a history of diabetes mellitus, hypertension, peripheral vascular disease with a nonhealing right medial malleoli wound presented to the ED at [facility] after the patient's son noted her to be more confused on the day of admission. The patient had been going to the wound care center at [facility], but her right malleolus wound has not made significant progress. The patient had a right common iliac angioplasty and stent placed on 08/15/2013 by Dr. Scot Dock. The patient has not been on any antibiotics recently. In the ED, the patient was noted to be febrile with temperature 100.39F, heart rate 120s, WBC 19.1. She was transferred to [facility] for definitive care vascular surgery as well as antibiotics for her wound. MRI  of the right lower extremity showed osteomyelitis of the right ankle with myositis.   Hospital Course:  Sepsis secondary to diabetic foot ulcer  -sepsis resolved  -no further fever and normal WBC's now.  -Secondary to diabetic foot infection/osteomyelits on the right foot  -Chest x-ray and urinalysis negative on admission. Patient denies cough, chest pain, shortness of breath and dysuria  -Continue now on IV vancomycin and PO levaquin and complete a total of 6 weeks antibiotics. (last dose 11/07/13) -Blood cultures has remained negative  -will continue to follow up as an outpatient with orthopedic service for further rec's regarding wound care and need for surgical interventions.  Diabetic foot infection with presumptive right ankle osteomyelitis  -positive probe to bone test on exam  -MRI foot--shows osteomyelitis, myositis  -Continue broad spectrum antibiotics for now; as mentioned above plan is for 6 weeks abx's therapy with vancomycin and PO levaquin  -will follow with ortho for further rec's  -ESR--70   Atherosclerosis of native arteries of the extremities with ulceration, PVD  -s/p revascularization on 09/27/13  -will follow with vascular service for further rec's  -wound looking good, no drainage or erythema  Tobacco abuse  -This is contributing to her poor wound healing  -Tobacco cessation discussed   Hypertension  -Continue HCTZ and lisinopril  -BP fairly stable and well controlled  -reassess and adjust antihypertensive regimen as needed in outpatient setting (ocassionally non sustained increase in BP, felt to be secondary to pain)  DM, with complications of neuropathy and vasculopathy  -Hemoglobin A1c 7.6  -Patient experiencing some hyperglycemia while inpatient, most likely  due to infection  -at discharge CBG's remains < 200  -Continue hypoglycemic regimen of metformin and amaryl -PCP to follow closely and adjust regimen as needed -advise to follow low carb diet    Hypokalemia  -repleted  -K WNL at discharge. Will follow BMET as an outpatient during follow up visit.  hyperlipidemia  -Continue statin    Procedures: CXR 09/23/2013 No active cardiopulmonary disease.  Right ankle XRAY 09/23/2013 Soft tissue changes without acute bony abnormality.  Right foot CT 09/23/2013 Diffuse prominent soft tissue swelling is present. Soft tissue ulceration over the medial malleolus appears to be present. Bony erosion in the region of the medial malleolus cannot be excluded and osteomyelitis suspected. Confirmation with MRI and/or triphasic bone scan can be obtained. Femoral bypass 09/27/13  Consultations:  Vascular surgery  Orthopedic service  ID  Discharge Exam: Filed Vitals:   10/03/13 0522  BP: 179/62  Pulse: 70  Temp: 98.1 F (36.7 C)  Resp: 18   General: Pt is alert and in no acute distress, follows commands appropriately, reports mild pain and swelling on her legs (R > L)  HEENT: No icterus, No thrush, [place]/AT  Cardiovascular: RRR, S1/S2, no rubs, no gallops  Respiratory: CTA bilaterally, no wheezing, no crackles, no rhonchi  Abdomen: Soft/+BS, non tender, non distended, no guarding  Extremities: BLE varicose ulcer (right bigger than left); RLE s/p fem/pop bypass, drain removed prior to discharge, no significant suppuration and no redness on her wound. Slight tenderness. BLE swelling trace to 1-2 ++ (R>L)  Discharge Instructions You were cared for by a hospitalist during your hospital stay. If you have any questions about your discharge medications or the care you received while you were in the hospital after you are discharged, you can call the unit and asked to speak with the hospitalist on call if the hospitalist that took care of you is not available. Once you are discharged, your primary care physician will handle any further medical issues. Please note that NO REFILLS for any discharge medications will be authorized once you are discharged, as it  is imperative that you return to your primary care physician (or establish a relationship with a primary care physician if you do not have one) for your aftercare needs so that they can reassess your need for medications and monitor your lab values.  Discharge Orders   Future Appointments Provider Department Dept Phone   10/24/2013 8:30 AM Serafina Mitchell, MD [facility] [phone]   Future Orders Complete By Expires   Diet - low sodium heart healthy  As directed    Discharge instructions  As directed        Medication List         alprazolam 2 MG tablet  Commonly known as:  XANAX  Take 1 mg by mouth every 6 (six) hours as needed for sleep or anxiety.     aspirin EC 325 MG tablet  Take 325 mg by mouth daily.     atorvastatin 20 MG tablet  Commonly known as:  LIPITOR  Take 20 mg by mouth daily.     escitalopram 10 MG tablet  Commonly known as:  LEXAPRO  Take 10 mg by mouth daily.     fentaNYL 75 MCG/HR  Commonly known as:  DURAGESIC - dosed mcg/hr  Place 75 mcg onto the skin every 3 (three) days.     fluticasone 50 MCG/ACT nasal spray  Commonly known as:  FLONASE  Place 1 spray into  both nostrils daily as needed for allergies.     glimepiride 2 MG tablet  Commonly known as:  AMARYL  Take 2 mg by mouth 2 (two) times daily.     hydrochlorothiazide 12.5 MG capsule  Commonly known as:  MICROZIDE  Take 12.5 mg by mouth daily.     levofloxacin 750 MG tablet  Commonly known as:  LEVAQUIN  Take 1 tablet (750 mg total) by mouth daily.     meloxicam 15 MG tablet  Commonly known as:  MOBIC  Take 15 mg by mouth daily.     metFORMIN 500 MG tablet  Commonly known as:  GLUCOPHAGE  Take 1,000 mg by mouth 2 (two) times daily with a meal.     NEURONTIN 300 MG capsule  Generic drug:  gabapentin  Take 600 mg by mouth 3 (three) times daily.     oxycodone 30 MG immediate release tablet  Commonly known as:  ROXICODONE  Take 30 mg by mouth every 6  (six) hours as needed for pain.     quinapril 20 MG tablet  Commonly known as:  ACCUPRIL  Take 20 mg by mouth daily.     silver sulfADIAZINE 1 % cream  Commonly known as:  SILVADENE  Apply topically daily. Apply to right malleolar ulcer plus dry dressing, change daily     vancomycin 1,500 mg in sodium chloride 0.9 % 500 mL  Inject 1,500 mg into the vein every 12 (twelve) hours.       Allergies  Allergen Reactions  . Penicillins Hives    Tolerates cefepime  . Adhesive [Tape] Rash       Follow-up Information   Follow up with DUDA,MARCUS V, MD In 1 week.   Specialty:  Orthopedic Surgery   Contact information:   [place] [address] [phone]       Follow up with Trula Slade IV, [place], MD In 2 weeks. (Office will call you to arrange your appt (sent))    Specialty:  Vascular Surgery   Contact information:   [place] [address] [phone]       Follow up with Leonides Grills, MD. Schedule an appointment as soon as possible for a visit in 10 days.   Specialty:  Family Medicine   Contact information:   [address] [place] [address] [phone]        The results of significant diagnostics from this hospitalization (including imaging, microbiology, ancillary and laboratory) are listed below for reference.    Significant Diagnostic Studies: Dg Chest 2 View  09/23/2013   CLINICAL DATA:  Fever and weakness  EXAM: CHEST  2 VIEW  COMPARISON:  07/10/2011  FINDINGS: The heart size and mediastinal contours are within normal limits. Both lungs are clear. The visualized skeletal structures are unremarkable.  IMPRESSION: No active cardiopulmonary disease.   Electronically Signed   By: Inez Catalina M.D.   On: 09/23/2013 17:19   Dg Ankle Complete Right  09/23/2013   CLINICAL DATA:  Right ankle sore  EXAM: RIGHT ANKLE - COMPLETE 3+ VIEW  COMPARISON:  None.  FINDINGS: Changes are noted consistent with a soft  tissue wound. No underlying bony abnormality is seen. No bony erosion is noted. Calcaneal spurs are seen.  IMPRESSION: Soft tissue changes without acute bony abnormality.   Electronically Signed   By: Inez Catalina M.D.   On: 09/23/2013 17:19   Ct Foot Right W Contrast  09/23/2013  CLINICAL DATA:  Right ankle ulcer.  Evaluate for osteomyelitis.  EXAM: CT OF THE RIGHT FOOT WITH CONTRAST  TECHNIQUE: Multidetector CT imaging was performed following the standard protocol during bolus administration of intravenous contrast.  CONTRAST:  107m OMNIPAQUE IOHEXOL 300 MG/ML  SOLN  COMPARISON:  DG ANKLE COMPLETE*R* dated 09/23/2013  FINDINGS: Motion artifact is present. Prominent soft tissue swelling is present about the ankle. Soft tissue ulceration appears to be present in this region. Bony erosion in the region of the medial malleolus cannot be excluded and osteomyelitis is suspected. Confirmation with MRI and/or triphasic bone scan can be obtained. Degenerative change present.  IMPRESSION: Diffuse prominent soft tissue swelling is present. Soft tissue ulceration over the medial malleolus appears to be present. Bony erosion in the region of the medial malleolus cannot be excluded and osteomyelitis suspected. Confirmation with MRI and/or triphasic bone scan can be obtained.   Electronically Signed   By: TMarcello Moores Register   On: 09/23/2013 19:40   Mr Foot Right W Wo Contrast  09/25/2013   CLINICAL DATA:  Osteomyelitis.  Medial ankle ulcer  EXAM: MRI OF THE RIGHT FOREFOOT WITHOUT AND WITH CONTRAST  TECHNIQUE: Multiplanar, multisequence MR imaging was performed both before and after administration of intravenous contrast.  CONTRAST:  276mMULTIHANCE GADOBENATE DIMEGLUMINE 529 MG/ML IV SOLN  COMPARISON:  CT scan 09/23/2013  FINDINGS: As demonstrated on the CT scan there is a medial ankle ulceration which appears to grow right down to the bone. The underlying cortex was abnormal on the CT scan and there is mild edema and  enhancement on the MRI consistent with cortical and subcortical osteomyelitis. No focal drainable soft tissue abscess. There is diffuse cellulitis and myositis but no findings for pyomyositis or septic arthritis.  The medial and lateral ankle tendons are intact. The posterior tibialis tendon is running right through the infected area and is very attenuated. The Achilles tendon and plantar fascia are intact. Moderate ankle, midfoot and hindfoot degenerative changes.  IMPRESSION: 1. 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 2. Cellulitis and myofasciitis but no findings for discrete drainable abscess or pyomyositis. 3. The posterior tibialis tendon is likely involved with the infection and is very attenuated.   Electronically Signed   By: MaKalman Jewels.D.   On: 09/25/2013 16:52   Dg Chest Port 1 View  09/25/2013   CLINICAL DATA:  Right PICC line  EXAM: PORTABLE CHEST - 1 VIEW  COMPARISON:  09/23/2013  FINDINGS: Cardiomediastinal silhouette is stable. Right arm PICC line in place with tip distal SVC. No pneumothorax. No acute infiltrate or pulmonary edema.  IMPRESSION: Right PICC line in place.  No pneumothorax.   Electronically Signed   By: LiLahoma Crocker.D.   On: 09/25/2013 18:12    Microbiology: Recent Results (from the past 240 hour(s))  URINE CULTURE     Status: None   Collection Time    09/23/13  3:40 PM      Result Value Ref Range Status   Specimen Description URINE, CATHETERIZED   Final   Special Requests NONE   Final   Culture  Setup Time     Final   Value: 09/24/2013 00:15     Performed at [facility]   Final   Value: NO GROWTH     Performed at [facility] Culture     Final   Value:  NO GROWTH     Performed at [facility]   Report Status 09/25/2013 FINAL   Final  CULTURE, BLOOD (ROUTINE X 2)     Status: None   Collection Time    09/23/13  3:55 PM      Result  Value Ref Range Status   Specimen Description Blood   Final   Special Requests NONE   Final   Culture NO GROWTH 5 DAYS   Final   Report Status 09/28/2013 FINAL   Final  CULTURE, BLOOD (ROUTINE X 2)     Status: None   Collection Time    09/23/13  4:02 PM      Result Value Ref Range Status   Specimen Description Blood RIGHT HAND   Final   Special Requests     Final   Value: BOTTLES DRAWN AEROBIC AND ANAEROBIC 5 CC EACH BOTTLE   Culture NO GROWTH 5 DAYS   Final   Report Status 09/28/2013 FINAL   Final  MRSA PCR SCREENING     Status: None   Collection Time    09/24/13 12:15 AM      Result Value Ref Range Status   MRSA by PCR NEGATIVE  NEGATIVE Final   Comment:            The GeneXpert MRSA Assay (FDA     approved for NASAL specimens     only), is one component of a     comprehensive MRSA colonization     surveillance program. It is not     intended to diagnose MRSA     infection nor to guide or     monitor treatment for     MRSA infections.     Labs: Basic Metabolic Panel:  Recent Labs Lab 09/28/13 0350 09/29/13 0615 09/29/13 1115 10/01/13 0540 10/03/13 0500  NA 141 141 143 141 143  K 3.7 3.1* 3.1* 3.7 4.2  CL 104 102 104 102 103  CO2 _0 GLUCOSE 169* 175* 240* 212* 168*  BUN 8 5* 5* 5* 6  CREATININE 0.38* 0.37* 0.33* 0.37* 0.45*  CALCIUM 7.6* 8.0* 8.0* 8.0* 8.4   Liver Function Tests:  Recent Labs Lab 09/29/13 1115  AST 10  ALT 7  ALKPHOS 48  BILITOT <0.2*  PROT 5.2*  ALBUMIN 2.1*   CBC:  Recent Labs Lab 09/27/13 0508 09/28/13 0350 09/29/13 0615 09/29/13 1115  WBC 5.5 9.8 7.2 7.5  HGB 13.6 10.0* 9.3* 9.1*  HCT 40.0 29.7* 27.2* 26.9*  MCV 88.3 87.6 86.9 87.1  PLT 181 197 201 211   CBG:  Recent Labs Lab 10/02/13 1110 10/02/13 1615 10/02/13 2107 10/03/13 0635 10/03/13 1108  GLUCAP 199* 192* 176* 168* 198*     Signed:  Barton Dubois  [facility] 10/03/2013, 2:10 PM

## 2013-10-11 ENCOUNTER — Encounter (HOSPITAL_COMMUNITY): Payer: Self-pay | Admitting: Pharmacy Technician

## 2013-10-12 ENCOUNTER — Other Ambulatory Visit (HOSPITAL_COMMUNITY): Payer: Self-pay | Admitting: Orthopedic Surgery

## 2013-10-12 ENCOUNTER — Encounter (HOSPITAL_COMMUNITY): Payer: Self-pay

## 2013-10-12 ENCOUNTER — Encounter (HOSPITAL_COMMUNITY)
Admission: RE | Admit: 2013-10-12 | Discharge: 2013-10-12 | Disposition: A | Discharge status: Home / Self Care | Payer: BC Managed Care – PPO | Source: Ambulatory Visit | Attending: Orthopedic Surgery | Admitting: Orthopedic Surgery

## 2013-10-12 HISTORY — DX: Non-pressure chronic ulcer of unspecified ankle with unspecified severity: L97.309

## 2013-10-12 HISTORY — DX: Unspecified osteoarthritis, unspecified site: M19.90

## 2013-10-12 HISTORY — DX: Atherosclerotic heart disease of native coronary artery without angina pectoris: I25.10

## 2013-10-12 LAB — PROTIME-INR
INR: 0.98 (ref 0.00–1.49)
Prothrombin Time: 12.8 seconds (ref 11.6–15.2)

## 2013-10-12 LAB — CBC
HCT: 32.1 % — ABNORMAL LOW (ref 36.0–46.0)
HEMOGLOBIN: 10.1 g/dL — AB (ref 12.0–15.0)
MCH: 27.9 pg (ref 26.0–34.0)
MCHC: 31.5 g/dL (ref 30.0–36.0)
MCV: 88.7 fL (ref 78.0–100.0)
Platelets: 285 10*3/uL (ref 150–400)
RBC: 3.62 MIL/uL — ABNORMAL LOW (ref 3.87–5.11)
RDW: 14.4 % (ref 11.5–15.5)
WBC: 4.1 10*3/uL (ref 4.0–10.5)

## 2013-10-12 LAB — COMPREHENSIVE METABOLIC PANEL
ALK PHOS: 57 U/L (ref 39–117)
ALT: 7 U/L (ref 0–35)
AST: 11 U/L (ref 0–37)
Albumin: 2.7 g/dL — ABNORMAL LOW (ref 3.5–5.2)
BUN: 5 mg/dL — ABNORMAL LOW (ref 6–23)
CALCIUM: 9 mg/dL (ref 8.4–10.5)
CHLORIDE: 100 meq/L (ref 96–112)
CO2: 29 mEq/L (ref 19–32)
Creatinine, Ser: 0.49 mg/dL — ABNORMAL LOW (ref 0.50–1.10)
GLUCOSE: 187 mg/dL — AB (ref 70–99)
Potassium: 4 mEq/L (ref 3.7–5.3)
Sodium: 142 mEq/L (ref 137–147)
Total Bilirubin: <0.2 mg/dL — ABNORMAL LOW (ref 0.3–1.2)
Total Protein: 6.3 g/dL (ref 6.0–8.3)

## 2013-10-12 LAB — APTT: aPTT: 30 seconds (ref 24–37)

## 2013-10-12 NOTE — Pre-Procedure Instructions (Signed)
[name]  10/12/2013   Your procedure is scheduled on:  Friday October 14, 2013.  [facility]"  Admitting.  Please call OR the morning of surgery at 8:00 AM for arrival time: [phone]   Remember:   Do not eat food or drink liquids after midnight.   Take these medicines the morning of surgery with A SIP OF WATER: Alprazolam (Xanax) if needed, Escitalopram (Lexapro), Flonase nasal spray, Gabapentin (Neurontin), and Oxycodone if needed for pain    Do NOT take any diabetic medications the morning of your surgery   Do not wear jewelry, make-up or nail polish.  Do not wear lotions, powders, or perfumes.  Do not shave 48 hours prior to surgery.   Do not bring valuables to the hospital.  [facility] is not responsible for any belongings or valuables.               Contacts, dentures or bridgework may not be worn into surgery.  Leave suitcase in the car. After surgery it may be brought to your room.  For patients admitted to the hospital, discharge time is determined by your treatment team.               Patients discharged the day of surgery will not be allowed to drive home.  Name and phone number of your driver: Family/Friend  Special Instructions: Please shower using CHG soap the night before and the morning of your surgery   Please read over the following fact sheets that you were given: Pain Booklet, Coughing and Deep Breathing and Surgical Site Infection Prevention

## 2013-10-12 NOTE — Progress Notes (Signed)
10/12/13 0959  OBSTRUCTIVE SLEEP APNEA  Have you ever been diagnosed with sleep apnea through a sleep study? No  Do you snore loudly (loud enough to be heard through closed doors)?  1  Do you often feel tired, fatigued, or sleepy during the daytime? 0  Has anyone observed you stop breathing during your sleep? 0  Do you have, or are you being treated for high blood pressure? 1  BMI more than 35 kg/m2? 1  Age over 57 years old? 1  Neck circumference greater than 40 cm/16 inches? 1  Gender: 0  Obstructive Sleep Apnea Score 5  Score 4 or greater  Results sent to PCP   This patient has screened at risk for sleep apnea using the STOP Bang tool during a pre-surgical visit. A score of 4 or greater is at risk for sleep apnea.

## 2013-10-13 ENCOUNTER — Encounter (HOSPITAL_COMMUNITY): Payer: Self-pay

## 2013-10-13 MED ORDER — CLINDAMYCIN PHOSPHATE 900 MG/50ML IV SOLN
900.0000 mg | INTRAVENOUS | Status: AC
  Administered 2013-10-14: 900 mg via INTRAVENOUS
  Filled 2013-10-13: qty 50

## 2013-10-13 NOTE — Progress Notes (Signed)
Anesthesia chart review: Patient is a 58 year old female scheduled for right second ray amputation, skin graft to right ankle on 10/14/13 by Dr. Lajoyce Cornersuda.    History includes smoking, diabetes mellitus type 2, hypertension, hyperlipidemia, arthritis, CAD s/p proximal RCA stent 2001, PAD s/p angioplasty of distal right CIA 08/15/13 and s/p right femoral TP trunk BPG 09/27/13, cholecystectomy. She was hospitalized in early April with sepsis with acute RLE osteomyelitis. BMI is 41 consistent with morbid obesity. OSA screening score was 5. PCP is Karleen HampshireWilliam McGough. [facility] but no notes since 2002 are noted.   EKG on 09/28/13 showed NSR, non-specific T wave abnormality.  Her last stress test found in Epic was from 04/28/03. Cardiac cath on 09/04/00 showed:  1. Coronary artery disease, status post prior stenting of the proximal right coronary artery 18 months ago with less than 20% narrowing at the stent site in the proximal right coronary artery, 40% narrowing in the proximal left anterior descending, 30% narrowing in the proximal circumflex artery with 30% narrowing in the second marginal branch of the circumflex artery and normal left ventricular function.  CXR on 09/23/13 showed no active cardiopulmonary disease.  Preoperative labs noted.   She does have a history of CAD without noted recent cardiology follow-up; however, she did tolerate high risk vascular surgery earlier this month.  Further evaluation by her assigned anesthesiologist on the day of surgery. If no acute CV symptoms then it is anticipated that she can proceed as planned with this procedure.  Anesthesiologist Dr. Jacklynn BueMassagee agrees with this plan.  Velna Ochsllison Chere Babson, PA-C [facility]/Anesthesiology Phone [phone] 10/13/2013 10:42 AM

## 2013-10-14 ENCOUNTER — Ambulatory Visit (HOSPITAL_COMMUNITY): Payer: BC Managed Care – PPO | Admitting: Anesthesiology

## 2013-10-14 ENCOUNTER — Encounter (HOSPITAL_COMMUNITY): Payer: BC Managed Care – PPO | Admitting: Vascular Surgery

## 2013-10-14 ENCOUNTER — Ambulatory Visit (HOSPITAL_COMMUNITY)
Admission: RE | Admit: 2013-10-14 | Discharge: 2013-10-14 | Disposition: A | Discharge status: Home / Self Care | Payer: BC Managed Care – PPO | Source: Ambulatory Visit | Attending: Orthopedic Surgery | Admitting: Orthopedic Surgery

## 2013-10-14 ENCOUNTER — Encounter (HOSPITAL_COMMUNITY): Payer: Self-pay

## 2013-10-14 ENCOUNTER — Encounter (HOSPITAL_COMMUNITY)
Admission: RE | Disposition: A | Discharge status: Home / Self Care | Payer: Self-pay | Source: Ambulatory Visit | Attending: Orthopedic Surgery

## 2013-10-14 DIAGNOSIS — M86169 Other acute osteomyelitis, unspecified tibia and fibula: Secondary | ICD-10-CM

## 2013-10-14 DIAGNOSIS — L97309 Non-pressure chronic ulcer of unspecified ankle with unspecified severity: Secondary | ICD-10-CM | POA: Insufficient documentation

## 2013-10-14 DIAGNOSIS — E785 Hyperlipidemia, unspecified: Secondary | ICD-10-CM | POA: Insufficient documentation

## 2013-10-14 DIAGNOSIS — I10 Essential (primary) hypertension: Secondary | ICD-10-CM | POA: Insufficient documentation

## 2013-10-14 DIAGNOSIS — I2510 Atherosclerotic heart disease of native coronary artery without angina pectoris: Secondary | ICD-10-CM | POA: Insufficient documentation

## 2013-10-14 DIAGNOSIS — I96 Gangrene, not elsewhere classified: Secondary | ICD-10-CM | POA: Insufficient documentation

## 2013-10-14 DIAGNOSIS — L98499 Non-pressure chronic ulcer of skin of other sites with unspecified severity: Secondary | ICD-10-CM

## 2013-10-14 DIAGNOSIS — E669 Obesity, unspecified: Secondary | ICD-10-CM | POA: Insufficient documentation

## 2013-10-14 DIAGNOSIS — E1169 Type 2 diabetes mellitus with other specified complication: Secondary | ICD-10-CM | POA: Insufficient documentation

## 2013-10-14 DIAGNOSIS — I739 Peripheral vascular disease, unspecified: Secondary | ICD-10-CM

## 2013-10-14 DIAGNOSIS — M869 Osteomyelitis, unspecified: Secondary | ICD-10-CM | POA: Insufficient documentation

## 2013-10-14 DIAGNOSIS — F17200 Nicotine dependence, unspecified, uncomplicated: Secondary | ICD-10-CM | POA: Insufficient documentation

## 2013-10-14 DIAGNOSIS — I872 Venous insufficiency (chronic) (peripheral): Secondary | ICD-10-CM | POA: Insufficient documentation

## 2013-10-14 DIAGNOSIS — M9080 Osteopathy in diseases classified elsewhere, unspecified site: Secondary | ICD-10-CM | POA: Insufficient documentation

## 2013-10-14 DIAGNOSIS — Z9861 Coronary angioplasty status: Secondary | ICD-10-CM | POA: Insufficient documentation

## 2013-10-14 DIAGNOSIS — E1159 Type 2 diabetes mellitus with other circulatory complications: Secondary | ICD-10-CM | POA: Insufficient documentation

## 2013-10-14 HISTORY — PX: AMPUTATION: SHX166

## 2013-10-14 LAB — GLUCOSE, CAPILLARY: GLUCOSE-CAPILLARY: 120 mg/dL — AB (ref 70–99)

## 2013-10-14 SURGERY — AMPUTATION, FOOT, RAY
Anesthesia: General | Site: Foot | Laterality: Right

## 2013-10-14 MED ORDER — LACTATED RINGERS IV SOLN
INTRAVENOUS | Status: DC
  Administered 2013-10-14: 14:00:00 via INTRAVENOUS

## 2013-10-14 MED ORDER — FENTANYL CITRATE 0.05 MG/ML IJ SOLN
INTRAMUSCULAR | Status: DC | PRN
  Administered 2013-10-14: 50 ug via INTRAVENOUS

## 2013-10-14 MED ORDER — OXYCODONE-ACETAMINOPHEN 5-325 MG PO TABS
1.0000 | ORAL_TABLET | Freq: Once | ORAL | Status: AC
  Administered 2013-10-14: 1 via ORAL

## 2013-10-14 MED ORDER — OXYCODONE-ACETAMINOPHEN 5-325 MG PO TABS: 1.0000 | ORAL_TABLET | ORAL | Status: DC | PRN

## 2013-10-14 MED ORDER — ONDANSETRON HCL 4 MG/2ML IJ SOLN
INTRAMUSCULAR | Status: AC
  Filled 2013-10-14: qty 2

## 2013-10-14 MED ORDER — PHENYLEPHRINE HCL 10 MG/ML IJ SOLN
INTRAMUSCULAR | Status: DC | PRN
  Administered 2013-10-14: 120 ug via INTRAVENOUS

## 2013-10-14 MED ORDER — ONDANSETRON HCL 4 MG/2ML IJ SOLN: 4.0000 mg | Freq: Four times a day (QID) | INTRAMUSCULAR | Status: DC | PRN

## 2013-10-14 MED ORDER — ONDANSETRON HCL 4 MG/2ML IJ SOLN
INTRAMUSCULAR | Status: DC | PRN
  Administered 2013-10-14: 4 mg via INTRAVENOUS

## 2013-10-14 MED ORDER — OXYCODONE HCL 5 MG PO TABS
ORAL_TABLET | ORAL | Status: AC
  Administered 2013-10-14: 5 mg via ORAL
  Filled 2013-10-14: qty 1

## 2013-10-14 MED ORDER — OXYCODONE HCL 5 MG PO TABS
5.0000 mg | ORAL_TABLET | Freq: Once | ORAL | Status: AC | PRN
  Administered 2013-10-14: 5 mg via ORAL

## 2013-10-14 MED ORDER — 0.9 % SODIUM CHLORIDE (POUR BTL) OPTIME
TOPICAL | Status: DC | PRN
  Administered 2013-10-14: 1000 mL

## 2013-10-14 MED ORDER — FENTANYL CITRATE 0.05 MG/ML IJ SOLN
INTRAMUSCULAR | Status: AC
  Filled 2013-10-14: qty 5

## 2013-10-14 MED ORDER — HEPARIN SOD (PORK) LOCK FLUSH 100 UNIT/ML IV SOLN
250.0000 [IU] | INTRAVENOUS | Status: AC | PRN
  Administered 2013-10-14: 18:00:00

## 2013-10-14 MED ORDER — FENTANYL CITRATE 0.05 MG/ML IJ SOLN
25.0000 ug | INTRAMUSCULAR | Status: DC | PRN
  Administered 2013-10-14 (×2): 50 ug via INTRAVENOUS

## 2013-10-14 MED ORDER — PROPOFOL 10 MG/ML IV BOLUS
INTRAVENOUS | Status: AC
  Filled 2013-10-14: qty 20

## 2013-10-14 MED ORDER — FENTANYL CITRATE 0.05 MG/ML IJ SOLN
INTRAMUSCULAR | Status: AC
  Administered 2013-10-14: 50 ug via INTRAVENOUS
  Filled 2013-10-14: qty 2

## 2013-10-14 MED ORDER — OXYCODONE HCL 5 MG/5ML PO SOLN: 5.0000 mg | Freq: Once | ORAL | Status: AC | PRN

## 2013-10-14 MED ORDER — PROPOFOL 10 MG/ML IV BOLUS
INTRAVENOUS | Status: DC | PRN
  Administered 2013-10-14: 200 mg via INTRAVENOUS

## 2013-10-14 MED ORDER — MIDAZOLAM HCL 2 MG/2ML IJ SOLN
INTRAMUSCULAR | Status: AC
  Filled 2013-10-14: qty 2

## 2013-10-14 MED ORDER — MIDAZOLAM HCL 5 MG/5ML IJ SOLN
INTRAMUSCULAR | Status: DC | PRN
  Administered 2013-10-14: 2 mg via INTRAVENOUS

## 2013-10-14 MED ORDER — LIDOCAINE HCL (CARDIAC) 20 MG/ML IV SOLN
INTRAVENOUS | Status: AC
  Filled 2013-10-14: qty 5

## 2013-10-14 MED ORDER — LIDOCAINE HCL (CARDIAC) 20 MG/ML IV SOLN
INTRAVENOUS | Status: DC | PRN
  Administered 2013-10-14: 100 mg via INTRAVENOUS

## 2013-10-14 MED ORDER — OXYCODONE-ACETAMINOPHEN 5-325 MG PO TABS
ORAL_TABLET | ORAL | Status: AC
  Administered 2013-10-14: 1 via ORAL
  Filled 2013-10-14: qty 1

## 2013-10-14 SURGICAL SUPPLY — 40 items
BANDAGE GAUZE ELAST BULKY 4 IN (GAUZE/BANDAGES/DRESSINGS) ×1 IMPLANT
BLADE SAW SGTL MED 73X18.5 STR (BLADE) IMPLANT
BNDG COHESIVE 4X5 TAN STRL (GAUZE/BANDAGES/DRESSINGS) ×1 IMPLANT
BNDG GAUZE ELAST 4 BULKY (GAUZE/BANDAGES/DRESSINGS) ×2 IMPLANT
COVER SURGICAL LIGHT HANDLE (MISCELLANEOUS) ×3 IMPLANT
DRAPE U-SHAPE 47X51 STRL (DRAPES) ×4 IMPLANT
DRSG ADAPTIC 3X8 NADH LF (GAUZE/BANDAGES/DRESSINGS) ×1 IMPLANT
DRSG MEPITEL 4X7.2 (GAUZE/BANDAGES/DRESSINGS) ×2 IMPLANT
DRSG PAD ABDOMINAL 8X10 ST (GAUZE/BANDAGES/DRESSINGS) ×4 IMPLANT
DURAPREP 26ML APPLICATOR (WOUND CARE) ×3 IMPLANT
ELECT REM PT RETURN 9FT ADLT (ELECTROSURGICAL) ×3
ELECTRODE REM PT RTRN 9FT ADLT (ELECTROSURGICAL) ×1 IMPLANT
GLOVE BIOGEL PI IND STRL 7.5 (GLOVE) IMPLANT
GLOVE BIOGEL PI IND STRL 9 (GLOVE) ×1 IMPLANT
GLOVE BIOGEL PI INDICATOR 7.5 (GLOVE) ×2
GLOVE BIOGEL PI INDICATOR 9 (GLOVE) ×2
GLOVE ECLIPSE 7.0 STRL STRAW (GLOVE) ×2 IMPLANT
GLOVE SURG ORTHO 9.0 STRL STRW (GLOVE) ×3 IMPLANT
GOWN STRL REUS W/ TWL LRG LVL3 (GOWN DISPOSABLE) IMPLANT
GOWN STRL REUS W/ TWL XL LVL3 (GOWN DISPOSABLE) ×2 IMPLANT
GOWN STRL REUS W/TWL LRG LVL3 (GOWN DISPOSABLE) ×3
GOWN STRL REUS W/TWL XL LVL3 (GOWN DISPOSABLE) ×6
GRAFT TISS THERASKIN 2X3 (Tissue) IMPLANT
KIT BASIN OR (CUSTOM PROCEDURE TRAY) ×3 IMPLANT
KIT ROOM TURNOVER OR (KITS) ×3 IMPLANT
NS IRRIG 1000ML POUR BTL (IV SOLUTION) ×3 IMPLANT
PACK ORTHO EXTREMITY (CUSTOM PROCEDURE TRAY) ×3 IMPLANT
PAD ARMBOARD 7.5X6 YLW CONV (MISCELLANEOUS) ×4 IMPLANT
SPONGE GAUZE 4X4 12PLY (GAUZE/BANDAGES/DRESSINGS) ×3 IMPLANT
SPONGE LAP 18X18 X RAY DECT (DISPOSABLE) ×3 IMPLANT
STAPLER VISISTAT 35W (STAPLE) ×2 IMPLANT
STOCKINETTE IMPERVIOUS LG (DRAPES) IMPLANT
SUT ETHILON 2 0 PSLX (SUTURE) ×4 IMPLANT
TISSUE THERASKIN 2X3 (Tissue) ×3 IMPLANT
TOWEL OR 17X24 6PK STRL BLUE (TOWEL DISPOSABLE) ×3 IMPLANT
TOWEL OR 17X26 10 PK STRL BLUE (TOWEL DISPOSABLE) ×3 IMPLANT
TUBE CONNECTING 12'X1/4 (SUCTIONS) ×1
TUBE CONNECTING 12X1/4 (SUCTIONS) ×1 IMPLANT
UNDERPAD 30X30 INCONTINENT (UNDERPADS AND DIAPERS) ×1 IMPLANT
WATER STERILE IRR 1000ML POUR (IV SOLUTION) ×1 IMPLANT

## 2013-10-14 NOTE — Anesthesia Postprocedure Evaluation (Signed)
Anesthesia Post Note  Patient: Kaitlin RainsCheryl C Branca  Procedure(s) Performed: Procedure(s) (LRB): AMPUTATION RAY (Right)  Anesthesia type: General  Patient location: PACU  Post pain: Pain level controlled and Adequate analgesia  Post assessment: Post-op Vital signs reviewed, Patient's Cardiovascular Status Stable, Respiratory Function Stable, Patent Airway and Pain level controlled  Last Vitals:  Filed Vitals:   10/14/13 1600  BP:   Pulse: 99  Temp:   Resp: 16    Post vital signs: Reviewed and stable  Level of consciousness: awake, alert  and oriented  Complications: No apparent anesthesia complications

## 2013-10-14 NOTE — H&P (Signed)
[name] is an 58 y.o. female.   Chief Complaint: Gangrene right second toe with large ulcer medial malleolus right ankle HPI: Patient is a 58 year old woman with severe peripheral vascular disease and diabetes she is status post revascularization and presents at this time for limb salvage with a large ulcer over the medial malleolus and a gangrenous second toe  Past Medical History  Diagnosis Date  . Diabetes mellitus   . Peripheral vascular disease   . Hyperlipidemia   . Obesity   . Hypertension   . Arthritis   . Ulcer of ankle     right  . Coronary artery disease     Past Surgical History  Procedure Laterality Date  . Cholecystectomy    . Femoral-tibial bypass graft Right 09/27/2013    Procedure: BYPASS GRAFT FEMORAL-TIBIAL ARTERY-RIGHT;  Surgeon: Serafina Mitchell, MD;  Location: [facility] OR;  Service: Vascular;  Laterality: Right;  . Abdominal aorta stent Right   . Bypass graft popliteal to tibial Right   . Coronary angioplasty with stent placement      proximcal RCA stent ~ 2001    Family History  Problem Relation Age of Onset  . Cancer Mother   . Heart disease Father   . Hyperlipidemia Father   . Hypertension Father   . Other Father     varicose veins  . Heart attack Father   . Heart disease Sister   . Hyperlipidemia Sister   . Hypertension Sister   . Other Sister     varicose veins  . Heart attack Sister   . Heart disease Brother   . Hypertension Brother   . Hyperlipidemia Brother   . Other Brother     varicose veins  . Heart attack Brother    Social History:  reports that she has been smoking Cigarettes.  She has a 30 pack-year smoking history. She has never used smokeless tobacco. She reports that she does not drink alcohol or use illicit drugs.  Allergies:  Allergies  Allergen Reactions  . Penicillins Hives    Tolerates cefepime  . Adhesive [Tape] Rash    No prescriptions prior to admission    Results for orders placed during the hospital  encounter of 10/12/13 (from the past 48 hour(s))  APTT     Status: None   Collection Time    10/12/13 10:17 AM      Result Value Ref Range   aPTT 30  24 - 37 seconds  CBC     Status: Abnormal   Collection Time    10/12/13 10:17 AM      Result Value Ref Range   WBC 4.1  4.0 - 10.5 K/uL   RBC 3.62 (*) 3.87 - 5.11 MIL/uL   Hemoglobin 10.1 (*) 12.0 - 15.0 g/dL   HCT 32.1 (*) 36.0 - 46.0 %   MCV 88.7  78.0 - 100.0 fL   MCH 27.9  26.0 - 34.0 pg   MCHC 31.5  30.0 - 36.0 g/dL   RDW 14.4  11.5 - 15.5 %   Platelets 285  150 - 400 K/uL  COMPREHENSIVE METABOLIC PANEL     Status: Abnormal   Collection Time    10/12/13 10:17 AM      Result Value Ref Range   Sodium 142  137 - 147 mEq/L   Potassium 4.0  3.7 - 5.3 mEq/L   Chloride 100  96 - 112 mEq/L   CO2 29  19 - 32 mEq/L  Glucose, Bld 187 (*) 70 - 99 mg/dL   BUN 5 (*) 6 - 23 mg/dL   Creatinine, Ser 0.49 (*) 0.50 - 1.10 mg/dL   Calcium 9.0  8.4 - 10.5 mg/dL   Total Protein 6.3  6.0 - 8.3 g/dL   Albumin 2.7 (*) 3.5 - 5.2 g/dL   AST 11  0 - 37 U/L   ALT 7  0 - 35 U/L   Alkaline Phosphatase 57  39 - 117 U/L   Total Bilirubin <0.2 (*) 0.3 - 1.2 mg/dL   GFR calc non Af Amer >90  >90 mL/min   GFR calc Af Amer >90  >90 mL/min   Comment: (NOTE)     The eGFR has been calculated using the CKD EPI equation.     This calculation has not been validated in all clinical situations.     eGFR's persistently <90 mL/min signify possible Chronic Kidney     Disease.  PROTIME-INR     Status: None   Collection Time    10/12/13 10:17 AM      Result Value Ref Range   Prothrombin Time 12.8  11.6 - 15.2 seconds   INR 0.98  0.00 - 1.49   No results found.  Review of Systems  All other systems reviewed and are negative.   There were no vitals taken for this visit. Physical Exam  On examination patient's foot is warm she has good capillary refill. She has a large necrotic ulcer of the medial malleolus which is approximately 5 cm in diameter they're  gangrenous dry changes to the second toe. Assessment/Plan Assessment: Dry gangrene right foot second toe with large necrotic ulcer medial malleolus.  Plan: We'll plan for second ray amputation and application of allograft skin graft to the medial malleolus. Risks and benefits were discussed including nonhealing of the wounds potential for amputation. Patient states she understands and wished to proceed at this time.  Newt Minion 10/14/2013, 6:38 AM

## 2013-10-14 NOTE — OR Nursing (Signed)
Notified Dr. Lajoyce Cornersuda over the phone that his prescription was not signed for [name].  He stated he knew and he forgot to sign it before he left.  He stated the patient could take the prescription to the pharmacy and the pharmacy could call his office for confirmation.

## 2013-10-14 NOTE — Transfer of Care (Signed)
Immediate Anesthesia Transfer of Care Note  Patient: Kaitlin RainsCheryl C Gambrel  Procedure(s) Performed: Procedure(s) with comments: AMPUTATION RAY (Right) - Right 2nd Ray Amputation, Theraskin Graft Right Ankle  Patient Location: PACU  Anesthesia Type:General  Level of Consciousness: awake, alert , oriented and patient cooperative  Airway & Oxygen Therapy: Patient Spontanous Breathing and Patient connected to nasal cannula oxygen  Post-op Assessment: Report given to PACU RN, Post -op Vital signs reviewed and stable and Patient moving all extremities  Post vital signs: Reviewed and stable  Complications: No apparent anesthesia complications

## 2013-10-14 NOTE — Anesthesia Preprocedure Evaluation (Signed)
Anesthesia Evaluation  Patient identified by MRN, date of birth, ID band Patient awake    Reviewed: Allergy & Precautions, H&P , NPO status , Patient's Chart, lab work & pertinent test results  Airway Mallampati: II  Neck ROM: full    Dental   Pulmonary Current Smoker,          Cardiovascular hypertension, + CAD, + Cardiac Stents and + Peripheral Vascular Disease     Neuro/Psych    GI/Hepatic   Endo/Other  diabetes, Type obesity  Renal/GU      Musculoskeletal   Abdominal   Peds  Hematology   Anesthesia Other Findings   Reproductive/Obstetrics                           Anesthesia Physical Anesthesia Plan  ASA: III  Anesthesia Plan: General   Post-op Pain Management:    Induction: Intravenous  Airway Management Planned: LMA  Additional Equipment:   Intra-op Plan:   Post-operative Plan:   Informed Consent: I have reviewed the patients History and Physical, chart, labs and discussed the procedure including the risks, benefits and alternatives for the proposed anesthesia with the patient or authorized representative who has indicated his/her understanding and acceptance.     Plan Discussed with: CRNA, Anesthesiologist and Surgeon  Anesthesia Plan Comments:         Anesthesia Quick Evaluation

## 2013-10-14 NOTE — Op Note (Signed)
OPERATIVE REPORT  DATE OF SURGERY: 10/14/2013  PATIENT:  [name],  58 y.o. female  PRE-OPERATIVE DIAGNOSIS:  Right 2nd Toe Osteomyelitis and Ulcer Medial Ankle   POST-OPERATIVE DIAGNOSIS:  Right 2nd Toe Osteomyelitis and Ulcer Medial Ankle   PROCEDURE:  Procedure(s): AMPUTATION RAY right foot second toe. Excisional debridement skin soft tissue medial malleolus right ankle. Application of  allograft skin graft 2 x 3". SURGEON:  Surgeon(s): Nadara MustardMarcus V Duda, MD  ANESTHESIA:   general  EBL:  min ML  SPECIMEN:  No Specimen  TOURNIQUET:  * No tourniquets in log *  PROCEDURE DETAILS: Patient is a 58 year old woman with peripheral vascular disease and severe venous stasis insufficiency who presents with chronic venous stasis ulcer medial malleolus right ankle as well as dry gangrene of the second toe she has failed conservative care and presents at this time for surgical intervention. Risks and benefits were discussed including nonhealing of the incisions need for additional surgery. Patient states she understands and wished to proceed at this time. Description of procedure patient was brought to the operating room and underwent a general anesthetic. After adequate levels and anesthesia were obtained patient's right lower extremity was prepped using DuraPrep draped in a sterile field. A racquet incision was made after a timeout was called around the second ray the ray was resected in one block of tissue with the necrotic tissue. Hemostasis was obtained the incision was irrigated with normal saline and closed with 2-0 nylon. A 21 blade was used to debris the skin and soft tissue from the medial malleolus ulcer this measured 6 cm in diameter after debridement. This did not extend down to bone or joint. There was good petechial bleeding. The allograft skin graft was applied this stabilize the staples the skin graft was covered with Mepitel dressing a sterile dressing was applied a compressive wrap  with Coban was applied plan for discharge to home in stable condition.  PLAN OF CARE: Discharge to home after PACU  PATIENT DISPOSITION:  PACU - hemodynamically stable.   Nadara MustardMarcus V Duda, MD 10/14/2013 3:48 PM

## 2013-10-14 NOTE — OR Nursing (Signed)
Pt. Recovered and pain tolerable.  Pt's son Onalee HuaDavid stated he could not come and pick pt up until 2030-2100.  Pt will be placed in a recliner in phase II until son arrives.

## 2013-10-14 NOTE — Anesthesia Procedure Notes (Signed)
Procedure Name: LMA Insertion Date/Time: 10/14/2013 3:16 PM Performed by: Jerilee HohMUMM, Kylia Grajales N Pre-anesthesia Checklist: Patient identified, Emergency Drugs available, Suction available and Patient being monitored Patient Re-evaluated:Patient Re-evaluated prior to inductionOxygen Delivery Method: Circle system utilized Preoxygenation: Pre-oxygenation with 100% oxygen Intubation Type: IV induction LMA: LMA inserted LMA Size: 4.0 Tube type: Oral Number of attempts: 1 Placement Confirmation: positive ETCO2 and breath sounds checked- equal and bilateral Tube secured with: Tape Dental Injury: Teeth and Oropharynx as per pre-operative assessment

## 2013-10-17 NOTE — Discharge Summary (Signed)
  Final diagnosis osteomyelitis abscess right foot second toe with chronic ulcer medial malleolus right ankle. Surgical procedure right foot second ray amputation with skin graft to the medial malleolus. Discharge to home in stable condition. Followup in the office in one week to change the dressing.

## 2013-10-18 ENCOUNTER — Encounter (HOSPITAL_COMMUNITY): Payer: Self-pay | Admitting: Orthopedic Surgery

## 2013-10-19 ENCOUNTER — Ambulatory Visit: Payer: BC Managed Care – PPO | Admitting: Vascular Surgery

## 2013-10-19 ENCOUNTER — Encounter (HOSPITAL_COMMUNITY): Payer: BC Managed Care – PPO

## 2013-10-21 ENCOUNTER — Encounter: Payer: Self-pay | Admitting: Surgery

## 2013-10-24 ENCOUNTER — Ambulatory Visit (INDEPENDENT_AMBULATORY_CARE_PROVIDER_SITE_OTHER): Payer: Self-pay | Admitting: Surgery

## 2013-10-24 ENCOUNTER — Encounter: Payer: Self-pay | Admitting: Surgery

## 2013-10-24 VITALS — BP 141/61 | HR 94 | Temp 98.2°F | Ht 62.0 in | Wt 222.0 lb

## 2013-10-24 DIAGNOSIS — L98499 Non-pressure chronic ulcer of skin of other sites with unspecified severity: Principal | ICD-10-CM

## 2013-10-24 DIAGNOSIS — Z48812 Encounter for surgical aftercare following surgery on the circulatory system: Secondary | ICD-10-CM

## 2013-10-24 DIAGNOSIS — I739 Peripheral vascular disease, unspecified: Secondary | ICD-10-CM

## 2013-10-24 NOTE — Addendum Note (Signed)
Addended by: Sharee PimpleMCCHESNEY, MARILYN K on: 10/24/2013 09:44 AM   Modules accepted: Orders

## 2013-10-24 NOTE — Progress Notes (Signed)
The patient is back today for followup.  She is status post right femoral to TP trunk bypass graft with ipsilateral reverse great saphenous vein.  This was performed on 10/01/2013.  Simultaneously Dr. Lajoyce Cornersuda performed second toe amputation and debridement of a medial malleolus ulcer.  The patient continues to do very well.  She last saw Dr. Lajoyce Cornersuda this past Friday.  She remarked that he is very pleased with the progress she is making.  Her right leg is in a Radio broadcast assistantUnna boot.  Surprisingly, her right groin incision has done well.  Her drain site has closed.  Overall she has made a very nice recovery.  She will continue to receive wound care with Dr. Lajoyce Cornersuda until her wounds have all healed up.  I'll see her back in 3 months with a duplex ultrasound to evaluate her bypass graft as well as iliac stent

## 2013-11-21 ENCOUNTER — Telehealth: Payer: Self-pay

## 2013-11-21 NOTE — Telephone Encounter (Signed)
Phone call from pt.   Reports open incision (R) groin.  Stated there had been a knot in the incision that has opened-up; estimated open area of incision is 3" long x 1/4" width.  Reports a "brownish" drainage.  Stated she is on an antibiotic prescribed by Dr. Lajoyce Corners.  Denies fever/ chills.   Appt. given for 9:00 AM 11/22/13.  Made Dr. [name] aware of the above; agreed that appt. in AM is appropriate.  Pt. confirmed she will keep appt. @ 9:00 AM 6/2.

## 2013-11-22 ENCOUNTER — Encounter: Payer: Self-pay | Admitting: Family

## 2013-11-22 ENCOUNTER — Ambulatory Visit (INDEPENDENT_AMBULATORY_CARE_PROVIDER_SITE_OTHER): Payer: Self-pay | Admitting: Family

## 2013-11-22 VITALS — BP 134/74 | HR 87 | Temp 97.5°F | Resp 16 | Ht 62.0 in | Wt 208.0 lb

## 2013-11-22 DIAGNOSIS — L24A9 Irritant contact dermatitis due friction or contact with other specified body fluids: Secondary | ICD-10-CM

## 2013-11-22 DIAGNOSIS — T148XXA Other injury of unspecified body region, initial encounter: Secondary | ICD-10-CM

## 2013-11-22 NOTE — Progress Notes (Signed)
[facility] [place] HISTORY AND PHYSICAL -PAD  History of Present Illness [name] is a 58 y.o. female patient of Dr. [name] who is status post right femoral to TP trunk bypass graft with ipsilateral reverse great saphenous vein. This was performed on 10/01/2013. Simultaneously Dr. Lajoyce Corners performed second toe amputation and debridement of a medial malleolus ulcer.  Her right leg was in a [employer].  Her right groin incision had done well at her May, 2015 visit, her drain site had closed.  She will continue to receive wound care with Dr. Lajoyce Corners until her wounds have all healed up. At her May, 2015 visit she had been scheduled to return in August, 2015 with a duplex ultrasound to evaluate her bypass graft as well as iliac stent. She returns today with complaint of open incision right groin. She denies fever or chills, has had sinus issues in the last 3 days, has had nausea with some vomiting after eating in the last few days, sister states she may have acid stomach. She finished IV antibiotics a week ago, ordered by Dr. Lajoyce Corners.   Pt Diabetic: Yes, one month ago her A1C was 7.6, not quite in control Pt smoker: smoker  (1 ppd x 30 yrs)    Past Medical History  Diagnosis Date  . Diabetes mellitus   . Peripheral vascular disease   . Hyperlipidemia   . Obesity   . Hypertension   . Arthritis   . Ulcer of ankle     right  . Coronary artery disease     Social History History  Substance Use Topics  . Smoking status: Current Every Day Smoker -- 1.00 packs/day for 30 years    Types: Cigarettes  . Smokeless tobacco: Never Used     Comment: pt states that she is not quitting smoking and refused smoking cessation materials  . Alcohol Use: No    Family History Family History  Problem Relation Age of Onset  . Cancer Mother   . Heart disease Father   . Hyperlipidemia Father   . Hypertension Father   . Other Father     varicose veins  . Heart attack Father   .  Heart disease Sister   . Hyperlipidemia Sister   . Hypertension Sister   . Other Sister     varicose veins  . Heart attack Sister   . Heart disease Brother   . Hypertension Brother   . Hyperlipidemia Brother   . Other Brother     varicose veins  . Heart attack Brother     Past Surgical History  Procedure Laterality Date  . Cholecystectomy    . Femoral-tibial bypass graft Right 09/27/2013    Procedure: BYPASS GRAFT FEMORAL-TIBIAL ARTERY-RIGHT;  Surgeon: Nada Libman, MD;  Location: [facility] OR;  Service: Vascular;  Laterality: Right;  . Abdominal aorta stent Right   . Bypass graft popliteal to tibial Right   . Coronary angioplasty with stent placement      proximcal RCA stent ~ 2001  . Amputation Right 10/14/2013    Procedure: AMPUTATION RAY;  Surgeon: Nadara Mustard, MD;  Location: [facility] OR;  Service: Orthopedics;  Laterality: Right;  Right 2nd Ray Amputation, Theraskin Graft Right Ankle    Allergies  Allergen Reactions  . Penicillins Hives    Tolerates cefepime  . Adhesive [Tape] Rash    Current Outpatient Prescriptions  Medication Sig Dispense Refill  . alprazolam (XANAX) 2 MG tablet Take 1 mg by mouth  every 6 (six) hours as needed for sleep or anxiety.       Marland Kitchen aspirin EC 325 MG tablet Take 325 mg by mouth daily.      Marland Kitchen atorvastatin (LIPITOR) 20 MG tablet Take 20 mg by mouth daily.      Marland Kitchen escitalopram (LEXAPRO) 10 MG tablet Take 10 mg by mouth daily.      . fentaNYL (DURAGESIC - DOSED MCG/HR) 75 MCG/HR Place 75 mcg onto the skin every 3 (three) days.       . fluticasone (FLONASE) 50 MCG/ACT nasal spray Place 1 spray into both nostrils daily as needed for allergies.       Marland Kitchen gabapentin (NEURONTIN) 300 MG capsule Take 600 mg by mouth 3 (three) times daily.       Marland Kitchen glimepiride (AMARYL) 2 MG tablet Take 2 mg by mouth 2 (two) times daily.      . hydrochlorothiazide (MICROZIDE) 12.5 MG capsule Take 12.5 mg by mouth daily.      Marland Kitchen levofloxacin (LEVAQUIN) 750 MG tablet Take 750 mg by mouth  daily.      . meloxicam (MOBIC) 15 MG tablet Take 15 mg by mouth daily.      . metFORMIN (GLUCOPHAGE) 500 MG tablet Take 1,000 mg by mouth 2 (two) times daily with a meal.      . oxycodone (ROXICODONE) 30 MG immediate release tablet Take 30 mg by mouth every 6 (six) hours as needed for pain.      Marland Kitchen oxyCODONE-acetaminophen (ROXICET) 5-325 MG per tablet Take 1 tablet by mouth every 4 (four) hours as needed for severe pain.  60 tablet  0  . quinapril (ACCUPRIL) 20 MG tablet Take 20 mg by mouth daily.      . silver sulfADIAZINE (SILVADENE) 1 % cream Apply 1 application topically daily.      Marland Kitchen VANCOMYCIN HCL IV Inject 1,250 mg into the vein every 12 (twelve) hours.       No current facility-administered medications for this visit.    ROS: See HPI for pertinent positives and negatives.   Physical Examination  Filed Vitals:   11/22/13 0917  BP: 134/74  Pulse: 87  Temp: 97.5 F (36.4 C)  TempSrc: Oral  Resp: 16  Height: 5\' 2"  (1.575 m)  Weight: 208 lb (94.348 kg)  SpO2: 97%  Body mass index is 38.03 kg/(m^2).   General: A&O x 3, WDWN, obese female. Gait: not observed Eyes: no gross abnormalities Pulmonary: CTAB, without wheezes , rales or rhonchi. Cardiac: RRR    Aorta is not palpable. Radial pulses: 2+ palpable and =.                           VASCULAR EXAM: Extremities  without Gangrene; with open wounds in right groin: fibrinous exudate. Right foot and ankle in ace wrap dressing, right second toe with dressing and surgically absent. Left lower leg with special sock per Dr. Lajoyce Corners. Toes of both feet pink with brisk capillary refill.  LE Pulses LEFT RIGHT       FEMORAL  not palpable  3+ palpable        POPLITEAL  not palpable   no palpable       POSTERIOR TIBIAL  not palpable   not palpable        DORSALIS PEDIS      ANTERIOR TIBIAL not palpable  not palpable     Abdomen: soft, NT, no masses. Skin: see extremities. Musculoskeletal: no muscle wasting or atrophy.  Neurologic: A&O X 3; Appropriate Affect ; SENSATION: normal; MOTOR FUNCTION:  moving all extremities equallyt. Speech is fluent/normal. CN 2-12 grossly intact.   ASSESSMENT: Leodis RainsCheryl C Wiers is a 58 y.o. female who is status post right femoral to TP trunk bypass graft with ipsilateral reverse great saphenous vein. This was performed on 10/01/2013. Simultaneously Dr. Lajoyce Cornersuda performed second toe amputation and debridement of a medial malleolus ulcer.  Her right leg was in a [employer] boot.  Her right groin incision had done well at her May, 2015 visit, her drain site had closed.  She will continue to receive wound care with Dr. Lajoyce Cornersuda until her wounds have all healed up. At her May, 2015 visit she had been scheduled to return in August, 2015 with a duplex ultrasound to evaluate her bypass graft as well as iliac stent. She returns today with complaint of open incision right groin. She denies fever or chills, has had sinus issues in the last 3 days, has had nausea with some vomiting after eating in the last few days, sister states she may have acid stomach. She finished IV antibiotics a week ago, ordered by Dr. Lajoyce Cornersuda.  Right groin open incision is superficial, no tracts felt after area was explored, debrided of fibrinous tissue, packed with NS dampened gauze and dressed. Dehiscence of right groin incision with no redness, no fluctuance, no swelling, no pain, in the background of mildly uncontrolled DM and tobacco abuse.   PLAN:  Counseled re smoking cessation. I discussed in depth with the patient the nature of atherosclerosis, and emphasized the importance of maximal medical management including strict control of blood pressure, blood glucose, and lipid levels, obtaining regular exercise, and cessation of smoking.  The patient is aware that without maximal medical management the underlying atherosclerotic  disease process will progress, limiting the benefit of any interventions.  Referral to Home Health for daily wet to dry dressing changes at right groin, and wound evaluations,family to do second daily dressing change.  At her May, 2015 visit she had been scheduled to return in August, 2015 with a duplex ultrasound to evaluate her bypass graft as well as iliac stent.  Based on the patient's vascular studies and examination, pt will return to clinic in 2 weeks, follow up with [name].  The patient was given information about PAD including signs, symptoms, treatment, what symptoms should prompt the patient to seek immediate medical care, and risk reduction measures to take.  Charisse MarchSuzanne Kali Deadwyler, RN, MSN, FNP-C [facility] [place] Office Phone: [phone]  Clinic MD: Hart RochesterLawson  11/22/2013 9:09 AM

## 2013-11-22 NOTE — Patient Instructions (Addendum)
Peripheral Vascular Disease Peripheral Vascular Disease (PVD), also called Peripheral Arterial Disease (PAD), is a circulation problem caused by cholesterol (atherosclerotic plaque) deposits in the arteries. PVD commonly occurs in the lower extremities (legs) but it can occur in other areas of the body, such as your arms. The cholesterol buildup in the arteries reduces blood flow which can cause pain and other serious problems. The presence of PVD can place a person at risk for Coronary Artery Disease (CAD).  CAUSES  Causes of PVD can be many. It is usually associated with more than one risk factor such as:   High Cholesterol.  Smoking.  Diabetes.  Lack of exercise or inactivity.  High blood pressure (hypertension).  Obesity.  Family history. SYMPTOMS   When the lower extremities are affected, patients with PVD may experience:  Leg pain with exertion or physical activity. This is called INTERMITTENT CLAUDICATION. This may present as cramping or numbness with physical activity. The location of the pain is associated with the level of blockage. For example, blockage at the abdominal level (distal abdominal aorta) may result in buttock or hip pain. Lower leg arterial blockage may result in calf pain.  As PVD becomes more severe, pain can develop with less physical activity.  In people with severe PVD, leg pain may occur at rest.  Other PVD signs and symptoms:  Leg numbness or weakness.  Coldness in the affected leg or foot, especially when compared to the other leg.  A change in leg color.  Patients with significant PVD are more prone to ulcers or sores on toes, feet or legs. These may take longer to heal or may reoccur. The ulcers or sores can become infected.  If signs and symptoms of PVD are ignored, gangrene may occur. This can result in the loss of toes or loss of an entire limb.  Not all leg pain is related to PVD. Other medical conditions can cause leg pain such  as:  Blood clots (embolism) or Deep Vein Thrombosis.  Inflammation of the blood vessels (vasculitis).  Spinal stenosis. DIAGNOSIS  Diagnosis of PVD can involve several different types of tests. These can include:  Pulse Volume Recording Method (PVR). This test is simple, painless and does not involve the use of X-rays. PVR involves measuring and comparing the blood pressure in the arms and legs. An ABI (Ankle-Brachial Index) is calculated. The normal ratio of blood pressures is 1. As this number becomes smaller, it indicates more severe disease.  < 0.95  indicates significant narrowing in one or more leg vessels.  <0.8 there will usually be pain in the foot, leg or buttock with exercise.  <0.4 will usually have pain in the legs at rest.  <0.25  usually indicates limb threatening PVD.  Doppler detection of pulses in the legs. This test is painless and checks to see if you have a pulses in your legs/feet.  A dye or contrast material (a substance that highlights the blood vessels so they show up on x-ray) may be given to help your caregiver better see the arteries for the following tests. The dye is eliminated from your body by the kidney's. Your caregiver may order blood work to check your kidney function and other laboratory values before the following tests are performed:  Magnetic Resonance Angiography (MRA). An MRA is a picture study of the blood vessels and arteries. The MRA machine uses a large magnet to produce images of the blood vessels.  Computed Tomography Angiography (CTA). A CTA is a   specialized x-ray that looks at how the blood flows in your blood vessels. An IV may be inserted into your arm so contrast dye can be injected.  Angiogram. Is a procedure that uses x-rays to look at your blood vessels. This procedure is minimally invasive, meaning a small incision (cut) is made in your groin. A small tube (catheter) is then inserted into the artery of your groin. The catheter is  guided to the blood vessel or artery your caregiver wants to examine. Contrast dye is injected into the catheter. X-rays are then taken of the blood vessel or artery. After the images are obtained, the catheter is taken out. TREATMENT  Treatment of PVD involves many interventions which may include:  Lifestyle changes:  Quitting smoking.  Exercise.  Following a low fat, low cholesterol diet.  Control of diabetes.  Foot care is very important to the PVD patient. Good foot care can help prevent infection.  Medication:  Cholesterol-lowering medicine.  Blood pressure medicine.  Anti-platelet drugs.  Certain medicines may reduce symptoms of Intermittent Claudication.  Interventional/Surgical options:  Angioplasty. An Angioplasty is a procedure that inflates a balloon in the blocked artery. This opens the blocked artery to improve blood flow.  Stent Implant. A wire mesh tube (stent) is placed in the artery. The stent expands and stays in place, allowing the artery to remain open.  Peripheral Bypass Surgery. This is a surgical procedure that reroutes the blood around a blocked artery to help improve blood flow. This type of procedure may be performed if Angioplasty or stent implants are not an option. SEEK IMMEDIATE MEDICAL CARE IF:   You develop pain or numbness in your arms or legs.  Your arm or leg turns cold, becomes blue in color.  You develop redness, warmth, swelling and pain in your arms or legs. MAKE SURE YOU:   Understand these instructions.  Will watch your condition.  Will get help right away if you are not doing well or get worse. Document Released: 07/17/2004 Document Revised: 09/01/2011 Document Reviewed: 06/13/2008 Exit[employer] Patient Information 2014 [employer].   Smoking Cessation Quitting smoking is important to your health and has many advantages. However, it is not always easy to quit since nicotine is a very addictive drug. Often times, people try 3  times or more before being able to quit. This document explains the best ways for you to prepare to quit smoking. Quitting takes hard work and a lot of effort, but you can do it. ADVANTAGES OF QUITTING SMOKING  You will live longer, feel better, and live better.  Your body will feel the impact of quitting smoking almost immediately.  Within 20 minutes, blood pressure decreases. Your pulse returns to its normal level.  After 8 hours, carbon monoxide levels in the blood return to normal. Your oxygen level increases.  After 24 hours, the chance of having a heart attack starts to decrease. Your breath, hair, and body stop smelling like smoke.  After 48 hours, damaged nerve endings begin to recover. Your sense of taste and smell improve.  After 72 hours, the body is virtually free of nicotine. Your bronchial tubes relax and breathing becomes easier.  After 2 to 12 weeks, lungs can hold more air. Exercise becomes easier and circulation improves.  The risk of having a heart attack, stroke, cancer, or lung disease is greatly reduced.  After 1 year, the risk of coronary heart disease is cut in half.  After 5 years, the risk of stroke falls to   the same as a nonsmoker.  After 10 years, the risk of lung cancer is cut in half and the risk of other cancers decreases significantly.  After 15 years, the risk of coronary heart disease drops, usually to the level of a nonsmoker.  If you are pregnant, quitting smoking will improve your chances of having a healthy baby.  The people you live with, especially any children, will be healthier.  You will have extra money to spend on things other than cigarettes. QUESTIONS TO THINK ABOUT BEFORE ATTEMPTING TO QUIT You may want to talk about your answers with your caregiver.  Why do you want to quit?  If you tried to quit in the past, what helped and what did not?  What will be the most difficult situations for you after you quit? How will you plan to  handle them?  Who can help you through the tough times? Your family? Friends? A caregiver?  What pleasures do you get from smoking? What ways can you still get pleasure if you quit? Here are some questions to ask your caregiver:  How can you help me to be successful at quitting?  What medicine do you think would be best for me and how should I take it?  What should I do if I need more help?  What is smoking withdrawal like? How can I get information on withdrawal? GET READY  Set a quit date.  Change your environment by getting rid of all cigarettes, ashtrays, matches, and lighters in your home, car, or work. Do not let people smoke in your home.  Review your past attempts to quit. Think about what worked and what did not. GET SUPPORT AND ENCOURAGEMENT You have a better chance of being successful if you have help. You can get support in many ways.  Tell your family, friends, and co-workers that you are going to quit and need their support. Ask them not to smoke around you.  Get individual, group, or telephone counseling and support. Programs are available at local hospitals and health centers. Call your local health department for information about programs in your area.  Spiritual beliefs and practices may help some smokers quit.  Download a "quit meter" on your computer to keep track of quit statistics, such as how long you have gone without smoking, cigarettes not smoked, and money saved.  Get a self-help book about quitting smoking and staying off of tobacco. LEARN NEW SKILLS AND BEHAVIORS  Distract yourself from urges to smoke. Talk to someone, go for a walk, or occupy your time with a task.  Change your normal routine. Take a different route to work. Drink tea instead of coffee. Eat breakfast in a different place.  Reduce your stress. Take a hot bath, exercise, or read a book.  Plan something enjoyable to do every day. Reward yourself for not smoking.  Explore  interactive web-based programs that specialize in helping you quit. GET MEDICINE AND USE IT CORRECTLY Medicines can help you stop smoking and decrease the urge to smoke. Combining medicine with the above behavioral methods and support can greatly increase your chances of successfully quitting smoking.  Nicotine replacement therapy helps deliver nicotine to your body without the negative effects and risks of smoking. Nicotine replacement therapy includes nicotine gum, lozenges, inhalers, nasal sprays, and skin patches. Some may be available over-the-counter and others require a prescription.  Antidepressant medicine helps people abstain from smoking, but how this works is unknown. This medicine is available by prescription.    Nicotinic receptor partial agonist medicine simulates the effect of nicotine in your brain. This medicine is available by prescription. Ask your caregiver for advice about which medicines to use and how to use them based on your health history. Your caregiver will tell you what side effects to look out for if you choose to be on a medicine or therapy. Carefully read the information on the package. Do not use any other product containing nicotine while using a nicotine replacement product.  RELAPSE OR DIFFICULT SITUATIONS Most relapses occur within the first 3 months after quitting. Do not be discouraged if you start smoking again. Remember, most people try several times before finally quitting. You may have symptoms of withdrawal because your body is used to nicotine. You may crave cigarettes, be irritable, feel very hungry, cough often, get headaches, or have difficulty concentrating. The withdrawal symptoms are only temporary. They are strongest when you first quit, but they will go away within 10 14 days. To reduce the chances of relapse, try to:  Avoid drinking alcohol. Drinking lowers your chances of successfully quitting.  Reduce the amount of caffeine you consume. Once you  quit smoking, the amount of caffeine in your body increases and can give you symptoms, such as a rapid heartbeat, sweating, and anxiety.  Avoid smokers because they can make you want to smoke.  Do not let weight gain distract you. Many smokers will gain weight when they quit, usually less than 10 pounds. Eat a healthy diet and stay active. You can always lose the weight gained after you quit.  Find ways to improve your mood other than smoking. FOR MORE INFORMATION  www.smokefree.gov  Document Released: 06/03/2001 Document Revised: 12/09/2011 Document Reviewed: 09/18/2011 [employer] Patient Information 2014 [employer].  

## 2013-12-02 ENCOUNTER — Encounter: Payer: Self-pay | Admitting: Surgery

## 2013-12-05 ENCOUNTER — Encounter: Payer: Self-pay | Admitting: Surgery

## 2013-12-05 ENCOUNTER — Ambulatory Visit (INDEPENDENT_AMBULATORY_CARE_PROVIDER_SITE_OTHER): Payer: Self-pay | Admitting: Surgery

## 2013-12-05 VITALS — BP 76/54 | HR 115 | Resp 18 | Ht 62.0 in | Wt 208.8 lb

## 2013-12-05 DIAGNOSIS — I739 Peripheral vascular disease, unspecified: Secondary | ICD-10-CM

## 2013-12-05 DIAGNOSIS — Z48812 Encounter for surgical aftercare following surgery on the circulatory system: Secondary | ICD-10-CM

## 2013-12-05 DIAGNOSIS — L98499 Non-pressure chronic ulcer of skin of other sites with unspecified severity: Principal | ICD-10-CM

## 2013-12-05 NOTE — Progress Notes (Signed)
The patient is back today for followup. She is status post right femoral to TP trunk bypass graft with ipsilateral reverse great saphenous vein. This was performed on 10/01/2013. Simultaneously Dr. Lajoyce Cornersuda performed second toe amputation and debridement of a medial malleolus ulcer.   The patient developed a groin incision separation, which is not surprising given her pannus.  She is back today for followup.  The wound size is getting smaller for her home health nurse.  It measures approximately 2 cm x 1 cm.  The base of the wound has beefy red tissue and appears to be very healthy.  There is no foul odor or drainage.  There is no surrounding erythema.  Overall, I very pleased with her recovery.  Her groin incision should heal up within the next 3-4 weeks.  I will see her back in about that time.  She will need to be placed on ultrasound surveillance for her bypass graft.

## 2013-12-05 NOTE — Progress Notes (Signed)
I [facility] [name], [name] [phone]), She will be doing twice a week wound checks for the next 4 weeks or until patient is seen again by [name].

## 2013-12-16 ENCOUNTER — Ambulatory Visit: Payer: BC Managed Care – PPO | Admitting: Dietician

## 2014-01-13 ENCOUNTER — Encounter: Payer: Self-pay | Admitting: Surgery

## 2014-01-16 ENCOUNTER — Ambulatory Visit (INDEPENDENT_AMBULATORY_CARE_PROVIDER_SITE_OTHER): Payer: BC Managed Care – PPO | Admitting: Surgery

## 2014-01-16 ENCOUNTER — Encounter: Payer: Self-pay | Admitting: Surgery

## 2014-01-16 VITALS — BP 126/46 | HR 95 | Ht 62.0 in | Wt 202.0 lb

## 2014-01-16 DIAGNOSIS — Z48812 Encounter for surgical aftercare following surgery on the circulatory system: Secondary | ICD-10-CM

## 2014-01-16 NOTE — Progress Notes (Signed)
[facility] [place] HISTORY AND PHYSICAL   CC:  F/u for left groin wound  Karleen Hampshire, MD  HPI: This is a 58 y.o. female who is s/p right femoral to TP trunk bypass with ipsilateral reversed saphenous vein on 10/01/13.  She has had a non healing wound of her right groin for which she returns today for follow up. She states that she is not having much pain.  She is doing the dressing changes herself and states the wound is getting better.  Her right foot is in a Darco shoe and she states that Dr. Lajoyce Corners continues to take care of her great toe amputation site.   Past Medical History  Diagnosis Date  . Diabetes mellitus   . Peripheral vascular disease   . Hyperlipidemia   . Obesity   . Hypertension   . Arthritis   . Ulcer of ankle     right  . Coronary artery disease    Past Surgical History  Procedure Laterality Date  . Cholecystectomy    . Femoral-tibial bypass graft Right 09/27/2013    Procedure: BYPASS GRAFT FEMORAL-TIBIAL ARTERY-RIGHT;  Surgeon: Nada Libman, MD;  Location: [facility] OR;  Service: Vascular;  Laterality: Right;  . Abdominal aorta stent Right   . Bypass graft popliteal to tibial Right   . Coronary angioplasty with stent placement      proximcal RCA stent ~ 2001  . Amputation Right 10/14/2013    Procedure: AMPUTATION RAY;  Surgeon: Nadara Mustard, MD;  Location: [facility] OR;  Service: Orthopedics;  Laterality: Right;  Right 2nd Ray Amputation, [place]in Graft Right Ankle    Allergies  Allergen Reactions  . Penicillins Hives    Tolerates cefepime  . Adhesive [Tape] Rash    Current Outpatient Prescriptions  Medication Sig Dispense Refill  . alprazolam (XANAX) 2 MG tablet Take 1 mg by mouth every 6 (six) hours as needed for sleep or anxiety.       Marland Kitchen aspirin EC 325 MG tablet Take 325 mg by mouth daily.      Marland Kitchen atorvastatin (LIPITOR) 20 MG tablet Take 20 mg by mouth daily.      Marland Kitchen escitalopram (LEXAPRO) 10 MG tablet Take 10 mg by mouth daily.      .  fentaNYL (DURAGESIC - DOSED MCG/HR) 75 MCG/HR Place 75 mcg onto the skin every 3 (three) days.       . fluticasone (FLONASE) 50 MCG/ACT nasal spray Place 1 spray into both nostrils daily as needed for allergies.       Marland Kitchen gabapentin (NEURONTIN) 300 MG capsule Take 600 mg by mouth 3 (three) times daily.       Marland Kitchen glimepiride (AMARYL) 2 MG tablet Take 2 mg by mouth 2 (two) times daily.      . hydrochlorothiazide (MICROZIDE) 12.5 MG capsule Take 12.5 mg by mouth daily.      . meloxicam (MOBIC) 15 MG tablet Take 15 mg by mouth daily.      . metFORMIN (GLUCOPHAGE) 500 MG tablet Take 1,000 mg by mouth 2 (two) times daily with a meal.      . ONETOUCH DELICA LANCETS FINE MISC       . ONETOUCH VERIO test strip       . oxycodone (ROXICODONE) 30 MG immediate release tablet Take 30 mg by mouth every 6 (six) hours as needed for pain.      Marland Kitchen oxyCODONE-acetaminophen (ROXICET) 5-325 MG per tablet Take 1 tablet by mouth every  4 (four) hours as needed for severe pain.  60 tablet  0  . quinapril (ACCUPRIL) 20 MG tablet Take 20 mg by mouth daily.      . silver sulfADIAZINE (SILVADENE) 1 % cream Apply 1 application topically daily.      . VESICARE 5 MG tablet daily.      Marland Kitchen doxycycline (VIBRAMYCIN) 100 MG capsule Take 500 mg by mouth 2 (two) times daily.      Marland Kitchen levofloxacin (LEVAQUIN) 750 MG tablet Take 750 mg by mouth daily.      Marland Kitchen VANCOMYCIN HCL IV Inject 1,250 mg into the vein every 12 (twelve) hours.       No current facility-administered medications for this visit.    Family History  Problem Relation Age of Onset  . Cancer Mother   . Heart disease Father   . Hyperlipidemia Father   . Hypertension Father   . Other Father     varicose veins  . Heart attack Father   . Heart disease Sister   . Hyperlipidemia Sister   . Hypertension Sister   . Other Sister     varicose veins  . Heart attack Sister   . Heart disease Brother   . Hypertension Brother   . Hyperlipidemia Brother   . Other Brother      varicose veins  . Heart attack Brother     History   Social History  . Marital Status: Widowed    Spouse Name: N/A    Number of Children: N/A  . Years of Education: N/A   Occupational History  . Not on file.   Social History Main Topics  . Smoking status: Light Tobacco Smoker -- 1.00 packs/day for 30 years    Types: Cigarettes  . Smokeless tobacco: Never Used     Comment: pt states that she is not quitting smoking and refused smoking cessation materials  . Alcohol Use: No  . Drug Use: No  . Sexual Activity: Not on file   Other Topics Concern  . Not on file   Social History Narrative  . No narrative on file     ROS: [x]  Positive   [ ]  Negative   [ ]  All sytems reviewed and are negative  Cardiovascular: []  chest pain/pressure []  palpitations []  SOB lying flat []  DOE [x]  pain in legs while walking [x]  pain in feet when lying flat []  hx of DVT []  hx of phlebitis [x]  swelling in legs [x]  varicose veins  Pulmonary: []  productive cough []  asthma []  wheezing  Neurologic: []  weakness in arms or legs [x]  numbness in arms or legs [] difficulty speaking or slurred speech []  temporary loss of vision in one eye []  dizziness  Hematologic: []  bleeding problems []  problems with blood clotting easily  GI []  vomiting blood []  blood in stool  GU: []  burning with urination []  blood in urine  Psychiatric: []  hx of major depression  Integumentary: []  rashes [x]  non healing wound right groin  Constitutional: []  fever []  chills   PHYSICAL EXAMINATION:  Filed Vitals:   01/16/14 1539  BP: 126/46  Pulse: 95   Body mass index is 36.94 kg/(m^2).  General:  WDWN, morbidly obese female in NAD Gait: Not observed HENT: WNL, normocephalic Eyes: Pupils equal Pulmonary: normal non-labored breathing , without Rales, rhonchi,  wheezing Abdomen: soft, obese Skin: without rashes, without ulcers  Vascular Exam/Pulses:  Her right groin has a superficial wound with  beefy red granulation tissue.  There is very minimal  fibrinous tissue Extremities:  Her right foot is in a darco shoe Musculoskeletal: no muscle wasting or atrophy  Neurologic: A&O X 3; Appropriate Affect; moving all extremities equally. Speech is fluent/normal   Non-Invasive Vascular Imaging:  None today  Pt meds includes: Statin:  Yes.   Beta Blocker:  No Aspirin:  Yes.   ACEI:  Yes.   ARB:  No. Other Antiplatelet/Anticoagulant:  No.   ASSESSMENT/PLAN:: 58 y.o. female who is s/p right femoral to TP trunk bypass with ipsilateral reversed saphenous vein on 10/01/13.  She has had a non healing wound of her left groin for which she returns today for follow up.   -her wound continues to heal up nicely.  It is superficial with beefy red granulation tissue.  [name] briefly debrided the wound with a cotton tip applicator. -she does have an appt next month for f/u duplex of bypass and visit with [name].  We will check her wound at that time. -continue current dressing changes regimen, which is wet to dry dressing changes  bid   Doreatha MassedSamantha Rhyne, PA-C [facility] [phone]  Clinic MD:  Pt seen and examined in conjunction with [name]  I agree with the above.  The patient has been seen and examined.  Per the patient, her amputation site by Dr. Lajoyce Cornersuda has been getting better.  She continues to perform dressing changes in her right groin.  This wound looks very healthy with a good granulation tissue base.  Does not track anywhere.  The patient will continue with wet-to-dry dressing changes.  She is scheduled to followup with me in one month with a Doppler study of her bypass graft  Durene CalWells Brabham

## 2014-01-19 ENCOUNTER — Other Ambulatory Visit: Payer: Self-pay | Admitting: *Deleted

## 2014-01-19 DIAGNOSIS — I739 Peripheral vascular disease, unspecified: Secondary | ICD-10-CM

## 2014-01-19 DIAGNOSIS — Z48812 Encounter for surgical aftercare following surgery on the circulatory system: Secondary | ICD-10-CM

## 2014-02-03 ENCOUNTER — Encounter: Payer: Self-pay | Admitting: Surgery

## 2014-02-06 ENCOUNTER — Encounter: Payer: Self-pay | Admitting: Surgery

## 2014-02-06 ENCOUNTER — Ambulatory Visit (INDEPENDENT_AMBULATORY_CARE_PROVIDER_SITE_OTHER)
Admission: RE | Admit: 2014-02-06 | Discharge: 2014-02-06 | Disposition: A | Discharge status: Home / Self Care | Payer: BC Managed Care – PPO | Source: Ambulatory Visit | Attending: Surgery | Admitting: Surgery

## 2014-02-06 ENCOUNTER — Ambulatory Visit (INDEPENDENT_AMBULATORY_CARE_PROVIDER_SITE_OTHER): Payer: BC Managed Care – PPO | Admitting: Surgery

## 2014-02-06 ENCOUNTER — Other Ambulatory Visit: Payer: Self-pay

## 2014-02-06 ENCOUNTER — Ambulatory Visit (HOSPITAL_COMMUNITY)
Admission: RE | Admit: 2014-02-06 | Discharge: 2014-02-06 | Disposition: A | Discharge status: Home / Self Care | Payer: BC Managed Care – PPO | Source: Ambulatory Visit | Attending: Surgery | Admitting: Surgery

## 2014-02-06 VITALS — BP 121/66 | HR 88 | Ht 62.0 in | Wt 197.3 lb

## 2014-02-06 DIAGNOSIS — I739 Peripheral vascular disease, unspecified: Secondary | ICD-10-CM

## 2014-02-06 DIAGNOSIS — Z48812 Encounter for surgical aftercare following surgery on the circulatory system: Secondary | ICD-10-CM | POA: Insufficient documentation

## 2014-02-06 DIAGNOSIS — L98499 Non-pressure chronic ulcer of skin of other sites with unspecified severity: Principal | ICD-10-CM | POA: Insufficient documentation

## 2014-02-06 NOTE — Progress Notes (Signed)
Patient name: Kaitlin RainsCheryl C Pollino MRN: [id number] DOB: August 12, 1955 Sex: female     Chief Complaint  Patient presents with  . Re-evaluation    3 month f/u     HISTORY OF PRESENT ILLNESS: The patient is back today for followup. She is status post right femoral to TP trunk bypass graft with ipsilateral reverse great saphenous vein. This was performed on 10/01/2013. Simultaneously Dr. Lajoyce Cornersuda performed second toe amputation and debridement of a medial malleolus ulcer.  She did develop a incisional separation at the proximal incision which is not unsupervised and given her pannus.  She has been having wet-to-dry dressing changes.  She has also been getting wound care by Dr. Lajoyce Cornersuda of her foot which is healing nicely.  Patient does report significant weight loss which has been attributed to antibiotic use.  She is scheduled to see gastroenterology in the immediate future.   Past Medical History  Diagnosis Date  . Diabetes mellitus   . Peripheral vascular disease   . Hyperlipidemia   . Obesity   . Hypertension   . Arthritis   . Ulcer of ankle     right  . Coronary artery disease   . COPD (chronic obstructive pulmonary disease)     Past Surgical History  Procedure Laterality Date  . Cholecystectomy    . Femoral-tibial bypass graft Right 09/27/2013    Procedure: BYPASS GRAFT FEMORAL-TIBIAL ARTERY-RIGHT;  Surgeon: Nada LibmanVance W Brabham, MD;  [facility];  Service: Vascular;  Laterality: Right;  . Abdominal aorta stent Right   . Bypass graft popliteal to tibial Right   . Coronary angioplasty with stent placement      proximcal RCA stent ~ 2001  . Amputation Right 10/14/2013    Procedure: AMPUTATION RAY;  Surgeon: Nadara MustardMarcus V Duda, MD;  [facility];  Service: Orthopedics;  Laterality: Right;  Right 2nd Ray Amputation, Theraskin Graft Right Ankle    History   Social History  . Marital Status: Widowed    Spouse Name: N/A    Number of Children: N/A  . Years of Education: N/A   Occupational  History  . Not on file.   Social History Main Topics  . Smoking status: Light Tobacco Smoker -- 1.00 packs/day for 30 years    Types: Cigarettes  . Smokeless tobacco: Never Used     Comment: pt states that she is not quitting smoking and refused smoking cessation materials  . Alcohol Use: No  . Drug Use: No  . Sexual Activity: Not on file   Other Topics Concern  . Not on file   Social History Narrative  . No narrative on file    Family History  Problem Relation Age of Onset  . Cancer Mother   . Heart disease Father   . Hyperlipidemia Father   . Hypertension Father   . Other Father     varicose veins  . Heart attack Father   . Heart disease Sister   . Hyperlipidemia Sister   . Hypertension Sister   . Other Sister     varicose veins  . Heart attack Sister   . Heart disease Brother   . Hypertension Brother   . Hyperlipidemia Brother   . Other Brother     varicose veins  . Heart attack Brother     Allergies as of 02/06/2014 - Review Complete 02/06/2014  Allergen Reaction Noted  . Penicillins Hives 07/10/2011  . Adhesive [tape] Rash 06/11/2012    Current Outpatient Prescriptions  on File Prior to Visit  Medication Sig Dispense Refill  . alprazolam (XANAX) 2 MG tablet Take 1 mg by mouth every 6 (six) hours as needed for sleep or anxiety.       . aspirin EC 325 MG tablet Take 325 mg by mouth daily.      . atorvastatin (LIPITOR) 20 MG tablet Take 20 mg by mouth daily.      . escitalopram (LEXAPRO) 10 MG tablet Take 10 mg by mouth daily.      . fentaNYL (DURAGESIC - DOSED MCG/HR) 75 MCG/HR Place 75 mcg onto the skin every 3 (three) days.       . fluticasone (FLONASE) 50 MCG/ACT nasal spray Place 1 spray into both nostrils daily as needed for allergies.       . gabapentin (NEURONTIN) 300 MG capsule Take 600 mg by mouth 3 (three) times daily.       . glimepiride (AMARYL) 2 MG tablet Take 2 mg by mouth 2 (two) times daily.      . hydrochlorothiazide (MICROZIDE) 12.5 MG  capsule Take 12.5 mg by mouth daily.      . meloxicam (MOBIC) 15 MG tablet Take 15 mg by mouth daily.      . metFORMIN (GLUCOPHAGE) 500 MG tablet Take 1,000 mg by mouth 2 (two) times daily with a meal.      . ONETOUCH DELICA LANCETS FINE MISC       . ONETOUCH VERIO test strip       . oxycodone (ROXICODONE) 30 MG immediate release tablet Take 30 mg by mouth every 6 (six) hours as needed for pain.      . oxyCODONE-acetaminophen (ROXICET) 5-325 MG per tablet Take 1 tablet by mouth every 4 (four) hours as needed for severe pain.  60 tablet  0  . quinapril (ACCUPRIL) 20 MG tablet Take 20 mg by mouth daily.      . silver sulfADIAZINE (SILVADENE) 1 % cream Apply 1 application topically daily.      . VESICARE 5 MG tablet daily.      . doxycycline (VIBRAMYCIN) 100 MG capsule Take 500 mg by mouth 2 (two) times daily.      . levofloxacin (LEVAQUIN) 750 MG tablet Take 750 mg by mouth daily.      . VANCOMYCIN HCL IV Inject 1,250 mg into the vein every 12 (twelve) hours.       No current facility-administered medications on file prior to visit.     REVIEW OF SYSTEMS: Please see history of present illness, otherwise no changes.  PHYSICAL EXAMINATION:   Vital signs are BP 121/66  Pulse 88  Ht 5' 2" (1.575 m)  Wt 197 lb 4.8 oz (89.495 kg)  BMI 36.08 kg/m2  SpO2 100% General: The patient appears their stated age. HEENT:  No gross abnormalities Pulmonary:  Non labored breathing Abdomen: Soft and non-tender Musculoskeletal: There are no major deformities. Neurologic: No focal weakness or paresthesias are detected, Skin: Superficial wound to the right groin.  This was dressed today with wet to dry dressing. Psychiatric: The patient has normal affect. Cardiovascular: There is a regular rate and rhythm without significant murmur appreciated.   Diagnostic Studies I have ordered and reviewed her duplex.  This shows a patent iliac stents.  The femoral tibial peroneal trunk bypass graft is patent.   There is a question of occlusion of the posterior tibial artery just beyond the anastomosis.  The velocity profile is 429 the anastomosis.  Assessment: Peripheral vascular   disease with ulceration, status post revascularization. Plan: The patient will continue with wet-to-dry dressing changes to her right groin.  This should heal within the next 4-6 weeks.  Based on the patient's ultrasound, I feel this is to be better of thyroid with angiography.  There appears to be a velocity elevation at the distal anastomosis.  There is also a concern over occlusion of the posterior tibial artery just beyond the bypass graft.  I schedule patient for angiography with access in the left groin and bilateral runoff with intervention on the right leg, particularly at the distal anastomosis if warranted.  This is been scheduled for September 9  V. Charlena Cross, M.D. [facility] Office: [phone] Pager:  [phone]

## 2014-02-09 ENCOUNTER — Encounter (INDEPENDENT_AMBULATORY_CARE_PROVIDER_SITE_OTHER): Payer: Self-pay | Admitting: *Deleted

## 2014-02-24 ENCOUNTER — Encounter (HOSPITAL_COMMUNITY): Payer: Self-pay | Admitting: Pharmacy Technician

## 2014-03-01 ENCOUNTER — Encounter (HOSPITAL_COMMUNITY)
Admission: RE | Disposition: A | Discharge status: Home / Self Care | Payer: Self-pay | Source: Ambulatory Visit | Attending: Surgery

## 2014-03-01 ENCOUNTER — Telehealth: Payer: Self-pay | Admitting: Surgery

## 2014-03-01 ENCOUNTER — Ambulatory Visit (HOSPITAL_COMMUNITY)
Admission: RE | Admit: 2014-03-01 | Discharge: 2014-03-01 | Disposition: A | Discharge status: Home / Self Care | Payer: BC Managed Care – PPO | Source: Ambulatory Visit | Attending: Surgery | Admitting: Surgery

## 2014-03-01 DIAGNOSIS — J449 Chronic obstructive pulmonary disease, unspecified: Secondary | ICD-10-CM | POA: Diagnosis not present

## 2014-03-01 DIAGNOSIS — Z9861 Coronary angioplasty status: Secondary | ICD-10-CM | POA: Diagnosis not present

## 2014-03-01 DIAGNOSIS — I2510 Atherosclerotic heart disease of native coronary artery without angina pectoris: Secondary | ICD-10-CM | POA: Insufficient documentation

## 2014-03-01 DIAGNOSIS — E785 Hyperlipidemia, unspecified: Secondary | ICD-10-CM | POA: Diagnosis not present

## 2014-03-01 DIAGNOSIS — T82898A Other specified complication of vascular prosthetic devices, implants and grafts, initial encounter: Secondary | ICD-10-CM

## 2014-03-01 DIAGNOSIS — I70409 Unspecified atherosclerosis of autologous vein bypass graft(s) of the extremities, unspecified extremity: Secondary | ICD-10-CM | POA: Diagnosis not present

## 2014-03-01 DIAGNOSIS — E119 Type 2 diabetes mellitus without complications: Secondary | ICD-10-CM | POA: Diagnosis not present

## 2014-03-01 DIAGNOSIS — Z7982 Long term (current) use of aspirin: Secondary | ICD-10-CM | POA: Insufficient documentation

## 2014-03-01 DIAGNOSIS — S98139A Complete traumatic amputation of one unspecified lesser toe, initial encounter: Secondary | ICD-10-CM | POA: Insufficient documentation

## 2014-03-01 DIAGNOSIS — I10 Essential (primary) hypertension: Secondary | ICD-10-CM | POA: Insufficient documentation

## 2014-03-01 DIAGNOSIS — Z6836 Body mass index (BMI) 36.0-36.9, adult: Secondary | ICD-10-CM | POA: Insufficient documentation

## 2014-03-01 DIAGNOSIS — F17200 Nicotine dependence, unspecified, uncomplicated: Secondary | ICD-10-CM | POA: Diagnosis not present

## 2014-03-01 DIAGNOSIS — E669 Obesity, unspecified: Secondary | ICD-10-CM | POA: Insufficient documentation

## 2014-03-01 DIAGNOSIS — J4489 Other specified chronic obstructive pulmonary disease: Secondary | ICD-10-CM | POA: Insufficient documentation

## 2014-03-01 DIAGNOSIS — L97309 Non-pressure chronic ulcer of unspecified ankle with unspecified severity: Secondary | ICD-10-CM | POA: Diagnosis not present

## 2014-03-01 HISTORY — PX: LOWER EXTREMITY ANGIOGRAM: SHX5508

## 2014-03-01 LAB — GLUCOSE, CAPILLARY
Glucose-Capillary: 153 mg/dL — ABNORMAL HIGH (ref 70–99)
Glucose-Capillary: 127 mg/dL — ABNORMAL HIGH (ref 70–99)

## 2014-03-01 LAB — POCT I-STAT, CHEM 8
BUN: 11 mg/dL (ref 6–23)
CHLORIDE: 101 meq/L (ref 96–112)
Calcium, Ion: 1.11 mmol/L — ABNORMAL LOW (ref 1.12–1.23)
Creatinine, Ser: 0.6 mg/dL (ref 0.50–1.10)
Glucose, Bld: 155 mg/dL — ABNORMAL HIGH (ref 70–99)
HCT: 45 % (ref 36.0–46.0)
Hemoglobin: 15.3 g/dL — ABNORMAL HIGH (ref 12.0–15.0)
Potassium: 3.6 mEq/L — ABNORMAL LOW (ref 3.7–5.3)
SODIUM: 137 meq/L (ref 137–147)
TCO2: 26 mmol/L (ref 0–100)

## 2014-03-01 LAB — POCT ACTIVATED CLOTTING TIME
ACTIVATED CLOTTING TIME: 214 s
ACTIVATED CLOTTING TIME: 157 s

## 2014-03-01 SURGERY — ANGIOGRAM, LOWER EXTREMITY
Anesthesia: LOCAL

## 2014-03-01 MED ORDER — ACETAMINOPHEN 325 MG RE SUPP: 325.0000 mg | RECTAL | Status: DC | PRN

## 2014-03-01 MED ORDER — FENTANYL CITRATE 0.05 MG/ML IJ SOLN
INTRAMUSCULAR | Status: AC
  Filled 2014-03-01: qty 2

## 2014-03-01 MED ORDER — HYDRALAZINE HCL 20 MG/ML IJ SOLN: 10.0000 mg | INTRAMUSCULAR | Status: DC | PRN

## 2014-03-01 MED ORDER — MIDAZOLAM HCL 2 MG/2ML IJ SOLN
INTRAMUSCULAR | Status: AC
  Filled 2014-03-01: qty 2

## 2014-03-01 MED ORDER — OXYCODONE HCL 5 MG PO TABS: 5.0000 mg | ORAL_TABLET | ORAL | Status: DC | PRN

## 2014-03-01 MED ORDER — ONDANSETRON HCL 4 MG/2ML IJ SOLN: 4.0000 mg | Freq: Four times a day (QID) | INTRAMUSCULAR | Status: DC | PRN

## 2014-03-01 MED ORDER — HEPARIN SODIUM (PORCINE) 1000 UNIT/ML IJ SOLN
INTRAMUSCULAR | Status: AC
  Filled 2014-03-01: qty 1

## 2014-03-01 MED ORDER — SODIUM CHLORIDE 0.9 % IV SOLN
INTRAVENOUS | Status: DC
  Administered 2014-03-01: 07:00:00 via INTRAVENOUS

## 2014-03-01 MED ORDER — LIDOCAINE HCL (PF) 1 % IJ SOLN
INTRAMUSCULAR | Status: AC
  Filled 2014-03-01: qty 30

## 2014-03-01 MED ORDER — MORPHINE SULFATE 10 MG/ML IJ SOLN: 2.0000 mg | INTRAMUSCULAR | Status: DC | PRN

## 2014-03-01 MED ORDER — GUAIFENESIN-DM 100-10 MG/5ML PO SYRP: 15.0000 mL | ORAL_SOLUTION | ORAL | Status: DC | PRN

## 2014-03-01 MED ORDER — LABETALOL HCL 5 MG/ML IV SOLN: 10.0000 mg | INTRAVENOUS | Status: DC | PRN

## 2014-03-01 MED ORDER — PHENOL 1.4 % MT LIQD: 1.0000 | OROMUCOSAL | Status: DC | PRN

## 2014-03-01 MED ORDER — ALUM & MAG HYDROXIDE-SIMETH 200-200-20 MG/5ML PO SUSP: 15.0000 mL | ORAL | Status: DC | PRN

## 2014-03-01 MED ORDER — SODIUM CHLORIDE 0.9 % IV SOLN
INTRAVENOUS | Status: DC
  Administered 2014-03-01: 50 mL/h via INTRAVENOUS

## 2014-03-01 MED ORDER — METOPROLOL TARTRATE 1 MG/ML IV SOLN: 2.0000 mg | INTRAVENOUS | Status: DC | PRN

## 2014-03-01 MED ORDER — ACETAMINOPHEN 325 MG PO TABS: 325.0000 mg | ORAL_TABLET | ORAL | Status: DC | PRN

## 2014-03-01 MED ORDER — HEPARIN (PORCINE) IN NACL 2-0.9 UNIT/ML-% IJ SOLN
INTRAMUSCULAR | Status: AC
  Filled 2014-03-01: qty 1000

## 2014-03-01 NOTE — Discharge Instructions (Signed)

## 2014-03-01 NOTE — Telephone Encounter (Signed)
Lm for pt re appt, although, I had to leave the message 2 times due to disconnecting calls. Mailed letter also, dm

## 2014-03-01 NOTE — Progress Notes (Signed)
D/c home with sister; left groin site unremarkable, no c/o

## 2014-03-01 NOTE — Progress Notes (Signed)
Site area: Left Groin Site Prior to Removal:  Level 0 Pressure Applied For:35 minutes with assist by Robin Searing and Amy Terrell Manual:   yes Patient Status During Pull:  Developed hematoma proximal to site, BP dropped, HR increased. IVF's increased.  Post Pull Site:  Level 1 Post Pull Instructions Given:  yes Post Pull Pulses Present: yes and remained present with doppler during sheath pull. Dressing Applied:  Tegaderm Bedrest begins @ 11:45 Comments: Dr. [name] in and aware of hemodynamic status currently and during sheath removal. Assessment done of Left groin.

## 2014-03-01 NOTE — Op Note (Signed)
Patient name: [name] MRN: [id number] DOB: 1956/02/05 Sex: female  03/01/2014 Pre-operative Diagnosis: Bypass graft stenosis Post-operative diagnosis:  Same Surgeon:  Jorge Ny Procedure Performed:  1.  ultrasound access left femoral artery  2.  abdominal aortogram  3.  right lower extremity runoff  4.  third order catheterization (popliteal artery)  5.  failed angioplasty right posterior tibial artery     Indications:  The patient is status post femoral to tibial peroneal trunk bypass graft for nonhealing wound.  Ultrasound identified a possible stenosis at the distal anastomosis she is here for further evaluation  Procedure:  The patient was identified in the holding area and taken to room 8.  The patient was then placed supine on the table and prepped and draped in the usual sterile fashion.  A time out was called.  Ultrasound was used to evaluate the left common femoral artery.  It was patent .  A digital ultrasound image was acquired.  A micropuncture needle was used to access the left common femoral artery under ultrasound guidance.  An 018 wire was advanced without resistance and a micropuncture sheath was placed.  The 018 wire was removed and a benson wire was placed.  The micropuncture sheath was exchanged for a 5 french sheath.  An omniflush catheter was advanced over the wire to the level of L-1.  An abdominal angiogram was obtained.  Next, using the omniflush catheter and a benson wire, the aortic bifurcation was crossed and the catheter was placed into theright external iliac artery and right runoff was obtained.    Findings:   Aortogram:  No obvious renal artery stenosis is identified.  The infrarenal abdominal aorta shows luminal irregularity with significant calcification, however there is no hemodynamically significant stenosis identified.  Right iliac stent is widely patent.  No iliac stenosis is identified on the right.  No significant iliac stenosis on the  left.  Right Lower Extremity:  Right common femoral and profunda femoral artery widely patent.  There is a bypass graft originating from the common femoral artery.  The bypass graft has a area in its midportion where it appears to bifurcate but this did not create a significant stenosis.  This likely represents a retained valve.  The distal anastomosis was to the tibioperoneal trunk/posterior tibial artery.  The outflow to the bypass graft is occluded.  The tibioperoneal trunk fills the anterior tibial retrograde.  There is delayed reconstitution of the posterior tibial and peroneal artery.  Left Lower Extremity:  Not evaluated today  Intervention:  After the above images were acquired, the decision was made to attempt intervention.  Over a Amplatz superstiff wire, a 6 French 45 cm sheath was advanced into the right distal external iliac artery.  I then used a 014 cougar wire and a 3 x 40 balloon to gain access into the bypass graft.  I tried to cross the occlusion at the distal anastomosis with the wire and balloon.  After multiple attempts, and having the wire reflux into the tibioperoneal trunk, I did not think that this was going to be productive and therefore I elected to terminate the procedure.  Catheters and wires were removed.  The sheath was withdrawn to the external iliac artery.  There were no the complications.  Impression:  #1  the bypass graft is patent however the outflow is to the tibioperoneal trunk and retrograde to the anterior tibial artery.  The peroneal and posterior tibial artery filled retrograde.  #  2  unsuccessful attempt at recannulization of the outflow tract to the bypass graft  #3  patent right iliac stenDurene Cals Tasheba Henson, M.D. [facility] Office: [phone] Pager:  [phone]

## 2014-03-01 NOTE — Progress Notes (Signed)
Fluid bolus completed. Groin unchanged and stable. BP and HR to pre pull ranges. Will transport to short stay.

## 2014-03-01 NOTE — Interval H&P Note (Signed)
History and Physical Interval Note:  03/01/2014 8:43 AM  [name]  has presented today for surgery, with the diagnosis of PVD with Ulcer  The various methods of treatment have been discussed with the patient and family. After consideration of risks, benefits and other options for treatment, the patient has consented to  Procedure(s): LOWER EXTREMITY ANGIOGRAM (N/A) as a surgical intervention .  The patient's history has been reviewed, patient examined, no change in status, stable for surgery.  I have reviewed the patient's chart and labs.  Questions were answered to the patient's satisfaction.     BRABHAM IV, V. WELLS

## 2014-03-01 NOTE — Telephone Encounter (Signed)
Message copied by Fredrich Birks on Wed Mar 01, 2014 11:36 AM ------      Message from: Lorin Mercy K      Created: Wed Mar 01, 2014 10:43 AM      Regarding: Schedule                   ----- Message -----         From: Nada Libman, MD         Sent: 03/01/2014  10:07 AM           To: [facility] Charge Pool            03/01/2014:            Surgeon:  Jorge Ny      Procedure Performed:       1.  ultrasound access left femoral artery       2.  abdominal aortogram       3.  right lower extremity runoff       4.  third order catheterization (popliteal artery)       5.  failed angioplasty right posterior tibial artery                   Schedule patient for followup in 2 weeks. ------

## 2014-03-01 NOTE — H&P (View-Only) (Signed)
Patient name: [name] MRN: [id number] DOB: August 12, 1955 Sex: female     Chief Complaint  Patient presents with  . Re-evaluation    3 month f/u     HISTORY OF PRESENT ILLNESS: The patient is back today for followup. She is status post right femoral to TP trunk bypass graft with ipsilateral reverse great saphenous vein. This was performed on 10/01/2013. Simultaneously Dr. Lajoyce Cornersuda performed second toe amputation and debridement of a medial malleolus ulcer.  She did develop a incisional separation at the proximal incision which is not unsupervised and given her pannus.  She has been having wet-to-dry dressing changes.  She has also been getting wound care by Dr. Lajoyce Cornersuda of her foot which is healing nicely.  Patient does report significant weight loss which has been attributed to antibiotic use.  She is scheduled to see gastroenterology in the immediate future.   Past Medical History  Diagnosis Date  . Diabetes mellitus   . Peripheral vascular disease   . Hyperlipidemia   . Obesity   . Hypertension   . Arthritis   . Ulcer of ankle     right  . Coronary artery disease   . COPD (chronic obstructive pulmonary disease)     Past Surgical History  Procedure Laterality Date  . Cholecystectomy    . Femoral-tibial bypass graft Right 09/27/2013    Procedure: BYPASS GRAFT FEMORAL-TIBIAL ARTERY-RIGHT;  Surgeon: Nada LibmanVance W Daivon Rayos, MD;  [facility];  Service: Vascular;  Laterality: Right;  . Abdominal aorta stent Right   . Bypass graft popliteal to tibial Right   . Coronary angioplasty with stent placement      proximcal RCA stent ~ 2001  . Amputation Right 10/14/2013    Procedure: AMPUTATION RAY;  Surgeon: Nadara MustardMarcus V Duda, MD;  [facility];  Service: Orthopedics;  Laterality: Right;  Right 2nd Ray Amputation, Theraskin Graft Right Ankle    History   Social History  . Marital Status: Widowed    Spouse Name: N/A    Number of Children: N/A  . Years of Education: N/A   Occupational  History  . Not on file.   Social History Main Topics  . Smoking status: Light Tobacco Smoker -- 1.00 packs/day for 30 years    Types: Cigarettes  . Smokeless tobacco: Never Used     Comment: pt states that she is not quitting smoking and refused smoking cessation materials  . Alcohol Use: No  . Drug Use: No  . Sexual Activity: Not on file   Other Topics Concern  . Not on file   Social History Narrative  . No narrative on file    Family History  Problem Relation Age of Onset  . Cancer Mother   . Heart disease Father   . Hyperlipidemia Father   . Hypertension Father   . Other Father     varicose veins  . Heart attack Father   . Heart disease Sister   . Hyperlipidemia Sister   . Hypertension Sister   . Other Sister     varicose veins  . Heart attack Sister   . Heart disease Brother   . Hypertension Brother   . Hyperlipidemia Brother   . Other Brother     varicose veins  . Heart attack Brother     Allergies as of 02/06/2014 - Review Complete 02/06/2014  Allergen Reaction Noted  . Penicillins Hives 07/10/2011  . Adhesive [tape] Rash 06/11/2012    Current Outpatient Prescriptions  on File Prior to Visit  Medication Sig Dispense Refill  . alprazolam (XANAX) 2 MG tablet Take 1 mg by mouth every 6 (six) hours as needed for sleep or anxiety.       Marland Kitchen aspirin EC 325 MG tablet Take 325 mg by mouth daily.      Marland Kitchen atorvastatin (LIPITOR) 20 MG tablet Take 20 mg by mouth daily.      Marland Kitchen escitalopram (LEXAPRO) 10 MG tablet Take 10 mg by mouth daily.      . fentaNYL (DURAGESIC - DOSED MCG/HR) 75 MCG/HR Place 75 mcg onto the skin every 3 (three) days.       . fluticasone (FLONASE) 50 MCG/ACT nasal spray Place 1 spray into both nostrils daily as needed for allergies.       Marland Kitchen gabapentin (NEURONTIN) 300 MG capsule Take 600 mg by mouth 3 (three) times daily.       Marland Kitchen glimepiride (AMARYL) 2 MG tablet Take 2 mg by mouth 2 (two) times daily.      . hydrochlorothiazide (MICROZIDE) 12.5 MG  capsule Take 12.5 mg by mouth daily.      . meloxicam (MOBIC) 15 MG tablet Take 15 mg by mouth daily.      . metFORMIN (GLUCOPHAGE) 500 MG tablet Take 1,000 mg by mouth 2 (two) times daily with a meal.      . ONETOUCH DELICA LANCETS FINE MISC       . ONETOUCH VERIO test strip       . oxycodone (ROXICODONE) 30 MG immediate release tablet Take 30 mg by mouth every 6 (six) hours as needed for pain.      Marland Kitchen oxyCODONE-acetaminophen (ROXICET) 5-325 MG per tablet Take 1 tablet by mouth every 4 (four) hours as needed for severe pain.  60 tablet  0  . quinapril (ACCUPRIL) 20 MG tablet Take 20 mg by mouth daily.      . silver sulfADIAZINE (SILVADENE) 1 % cream Apply 1 application topically daily.      . VESICARE 5 MG tablet daily.      Marland Kitchen doxycycline (VIBRAMYCIN) 100 MG capsule Take 500 mg by mouth 2 (two) times daily.      Marland Kitchen levofloxacin (LEVAQUIN) 750 MG tablet Take 750 mg by mouth daily.      Marland Kitchen VANCOMYCIN HCL IV Inject 1,250 mg into the vein every 12 (twelve) hours.       No current facility-administered medications on file prior to visit.     REVIEW OF SYSTEMS: Please see history of present illness, otherwise no changes.  PHYSICAL EXAMINATION:   Vital signs are BP 121/66  Pulse 88  Ht  (1.575 m)  Wt 197 lb 4.8 oz (89.495 kg)  BMI 36.08 kg/m2  SpO2 100% General: The patient appears their stated age. HEENT:  No gross abnormalities Pulmonary:  Non labored breathing Abdomen: Soft and non-tender Musculoskeletal: There are no major deformities. Neurologic: No focal weakness or paresthesias are detected, Skin: Superficial wound to the right groin.  This was dressed today with wet to dry dressing. Psychiatric: The patient has normal affect. Cardiovascular: There is a regular rate and rhythm without significant murmur appreciated.   Diagnostic Studies I have ordered and reviewed her duplex.  This shows a patent iliac stents.  The femoral tibial peroneal trunk bypass graft is patent.   There is a question of occlusion of the posterior tibial artery just beyond the anastomosis.  The velocity profile is 429 the anastomosis.  Assessment: Peripheral vascular  disease with ulceration, status post revascularization. Plan: The patient will continue with wet-to-dry dressing changes to her right groin.  This should heal within the next 4-6 weeks.  Based on the patient's ultrasound, I feel this is to be better of thyroid with angiography.  There appears to be a velocity elevation at the distal anastomosis.  There is also a concern over occlusion of the posterior tibial artery just beyond the bypass graft.  I schedule patient for angiography with access in the left groin and bilateral runoff with intervention on the right leg, particularly at the distal anastomosis if warranted.  This is been scheduled for September 9  V. Charlena Cross, M.D. [facility] Office: [phone] Pager:  [phone]

## 2014-03-05 ENCOUNTER — Encounter (HOSPITAL_COMMUNITY): Payer: Self-pay | Admitting: Emergency Medicine

## 2014-03-05 ENCOUNTER — Inpatient Hospital Stay (HOSPITAL_COMMUNITY)
Admission: EM | Admit: 2014-03-05 | Discharge: 2014-03-27 | DRG: 853 | Disposition: A | Discharge status: Rehabilitation Facility | Payer: BC Managed Care – PPO | Attending: Internal Medicine | Admitting: Internal Medicine

## 2014-03-05 ENCOUNTER — Emergency Department (HOSPITAL_COMMUNITY): Payer: BC Managed Care – PPO

## 2014-03-05 ENCOUNTER — Inpatient Hospital Stay (HOSPITAL_COMMUNITY): Payer: BC Managed Care – PPO

## 2014-03-05 DIAGNOSIS — M79605 Pain in left leg: Secondary | ICD-10-CM | POA: Diagnosis present

## 2014-03-05 DIAGNOSIS — G9340 Encephalopathy, unspecified: Secondary | ICD-10-CM

## 2014-03-05 DIAGNOSIS — I5023 Acute on chronic systolic (congestive) heart failure: Secondary | ICD-10-CM | POA: Diagnosis present

## 2014-03-05 DIAGNOSIS — L97529 Non-pressure chronic ulcer of other part of left foot with unspecified severity: Secondary | ICD-10-CM

## 2014-03-05 DIAGNOSIS — M1990 Unspecified osteoarthritis, unspecified site: Secondary | ICD-10-CM | POA: Diagnosis present

## 2014-03-05 DIAGNOSIS — Z7951 Long term (current) use of inhaled steroids: Secondary | ICD-10-CM

## 2014-03-05 DIAGNOSIS — S301XXA Contusion of abdominal wall, initial encounter: Secondary | ICD-10-CM | POA: Diagnosis present

## 2014-03-05 DIAGNOSIS — G8929 Other chronic pain: Secondary | ICD-10-CM | POA: Diagnosis present

## 2014-03-05 DIAGNOSIS — Y95 Nosocomial condition: Secondary | ICD-10-CM | POA: Diagnosis present

## 2014-03-05 DIAGNOSIS — F17210 Nicotine dependence, cigarettes, uncomplicated: Secondary | ICD-10-CM | POA: Diagnosis present

## 2014-03-05 DIAGNOSIS — Z6841 Body Mass Index (BMI) 40.0 and over, adult: Secondary | ICD-10-CM | POA: Diagnosis not present

## 2014-03-05 DIAGNOSIS — A409 Streptococcal sepsis, unspecified: Secondary | ICD-10-CM | POA: Diagnosis not present

## 2014-03-05 DIAGNOSIS — D509 Iron deficiency anemia, unspecified: Secondary | ICD-10-CM | POA: Diagnosis present

## 2014-03-05 DIAGNOSIS — E11628 Type 2 diabetes mellitus with other skin complications: Secondary | ICD-10-CM

## 2014-03-05 DIAGNOSIS — I5021 Acute systolic (congestive) heart failure: Secondary | ICD-10-CM | POA: Diagnosis present

## 2014-03-05 DIAGNOSIS — Z880 Allergy status to penicillin: Secondary | ICD-10-CM

## 2014-03-05 DIAGNOSIS — I872 Venous insufficiency (chronic) (peripheral): Secondary | ICD-10-CM | POA: Diagnosis present

## 2014-03-05 DIAGNOSIS — I10 Essential (primary) hypertension: Secondary | ICD-10-CM | POA: Diagnosis present

## 2014-03-05 DIAGNOSIS — B9561 Methicillin susceptible Staphylococcus aureus infection as the cause of diseases classified elsewhere: Secondary | ICD-10-CM | POA: Diagnosis present

## 2014-03-05 DIAGNOSIS — Z9861 Coronary angioplasty status: Secondary | ICD-10-CM

## 2014-03-05 DIAGNOSIS — A4901 Methicillin susceptible Staphylococcus aureus infection, unspecified site: Secondary | ICD-10-CM | POA: Diagnosis not present

## 2014-03-05 DIAGNOSIS — J9600 Acute respiratory failure, unspecified whether with hypoxia or hypercapnia: Secondary | ICD-10-CM | POA: Diagnosis not present

## 2014-03-05 DIAGNOSIS — E1165 Type 2 diabetes mellitus with hyperglycemia: Secondary | ICD-10-CM

## 2014-03-05 DIAGNOSIS — E46 Unspecified protein-calorie malnutrition: Secondary | ICD-10-CM | POA: Diagnosis present

## 2014-03-05 DIAGNOSIS — E870 Hyperosmolality and hypernatremia: Secondary | ICD-10-CM | POA: Diagnosis present

## 2014-03-05 DIAGNOSIS — Z8249 Family history of ischemic heart disease and other diseases of the circulatory system: Secondary | ICD-10-CM

## 2014-03-05 DIAGNOSIS — I059 Rheumatic mitral valve disease, unspecified: Secondary | ICD-10-CM | POA: Diagnosis present

## 2014-03-05 DIAGNOSIS — E118 Type 2 diabetes mellitus with unspecified complications: Secondary | ICD-10-CM

## 2014-03-05 DIAGNOSIS — G9349 Other encephalopathy: Secondary | ICD-10-CM | POA: Diagnosis present

## 2014-03-05 DIAGNOSIS — M86169 Other acute osteomyelitis, unspecified tibia and fibula: Secondary | ICD-10-CM

## 2014-03-05 DIAGNOSIS — K439 Ventral hernia without obstruction or gangrene: Secondary | ICD-10-CM | POA: Diagnosis present

## 2014-03-05 DIAGNOSIS — IMO0002 Reserved for concepts with insufficient information to code with codable children: Secondary | ICD-10-CM

## 2014-03-05 DIAGNOSIS — R5381 Other malaise: Secondary | ICD-10-CM | POA: Diagnosis not present

## 2014-03-05 DIAGNOSIS — R6521 Severe sepsis with septic shock: Secondary | ICD-10-CM | POA: Diagnosis present

## 2014-03-05 DIAGNOSIS — E119 Type 2 diabetes mellitus without complications: Secondary | ICD-10-CM | POA: Diagnosis present

## 2014-03-05 DIAGNOSIS — J441 Chronic obstructive pulmonary disease with (acute) exacerbation: Secondary | ICD-10-CM | POA: Diagnosis present

## 2014-03-05 DIAGNOSIS — E860 Dehydration: Secondary | ICD-10-CM | POA: Diagnosis present

## 2014-03-05 DIAGNOSIS — D649 Anemia, unspecified: Secondary | ICD-10-CM | POA: Diagnosis present

## 2014-03-05 DIAGNOSIS — Z48812 Encounter for surgical aftercare following surgery on the circulatory system: Secondary | ICD-10-CM

## 2014-03-05 DIAGNOSIS — R7881 Bacteremia: Secondary | ICD-10-CM | POA: Diagnosis not present

## 2014-03-05 DIAGNOSIS — I071 Rheumatic tricuspid insufficiency: Secondary | ICD-10-CM | POA: Diagnosis present

## 2014-03-05 DIAGNOSIS — J9601 Acute respiratory failure with hypoxia: Secondary | ICD-10-CM | POA: Diagnosis present

## 2014-03-05 DIAGNOSIS — I5181 Takotsubo syndrome: Secondary | ICD-10-CM | POA: Diagnosis present

## 2014-03-05 DIAGNOSIS — I2510 Atherosclerotic heart disease of native coronary artery without angina pectoris: Secondary | ICD-10-CM | POA: Diagnosis present

## 2014-03-05 DIAGNOSIS — R509 Fever, unspecified: Secondary | ICD-10-CM

## 2014-03-05 DIAGNOSIS — A419 Sepsis, unspecified organism: Secondary | ICD-10-CM | POA: Diagnosis not present

## 2014-03-05 DIAGNOSIS — J189 Pneumonia, unspecified organism: Secondary | ICD-10-CM

## 2014-03-05 DIAGNOSIS — L97419 Non-pressure chronic ulcer of right heel and midfoot with unspecified severity: Secondary | ICD-10-CM | POA: Diagnosis present

## 2014-03-05 DIAGNOSIS — E873 Alkalosis: Secondary | ICD-10-CM | POA: Diagnosis present

## 2014-03-05 DIAGNOSIS — J9602 Acute respiratory failure with hypercapnia: Secondary | ICD-10-CM

## 2014-03-05 DIAGNOSIS — E785 Hyperlipidemia, unspecified: Secondary | ICD-10-CM | POA: Diagnosis present

## 2014-03-05 DIAGNOSIS — E669 Obesity, unspecified: Secondary | ICD-10-CM | POA: Diagnosis present

## 2014-03-05 DIAGNOSIS — J90 Pleural effusion, not elsewhere classified: Secondary | ICD-10-CM

## 2014-03-05 DIAGNOSIS — L97409 Non-pressure chronic ulcer of unspecified heel and midfoot with unspecified severity: Secondary | ICD-10-CM | POA: Diagnosis not present

## 2014-03-05 DIAGNOSIS — A412 Sepsis due to unspecified staphylococcus: Secondary | ICD-10-CM | POA: Diagnosis present

## 2014-03-05 DIAGNOSIS — E876 Hypokalemia: Secondary | ICD-10-CM | POA: Diagnosis present

## 2014-03-05 DIAGNOSIS — I214 Non-ST elevation (NSTEMI) myocardial infarction: Secondary | ICD-10-CM | POA: Diagnosis present

## 2014-03-05 DIAGNOSIS — L97311 Non-pressure chronic ulcer of right ankle limited to breakdown of skin: Secondary | ICD-10-CM | POA: Diagnosis present

## 2014-03-05 DIAGNOSIS — Z79899 Other long term (current) drug therapy: Secondary | ICD-10-CM

## 2014-03-05 DIAGNOSIS — I330 Acute and subacute infective endocarditis: Secondary | ICD-10-CM | POA: Diagnosis present

## 2014-03-05 DIAGNOSIS — B958 Unspecified staphylococcus as the cause of diseases classified elsewhere: Secondary | ICD-10-CM | POA: Diagnosis not present

## 2014-03-05 DIAGNOSIS — Z7982 Long term (current) use of aspirin: Secondary | ICD-10-CM

## 2014-03-05 DIAGNOSIS — A4101 Sepsis due to Methicillin susceptible Staphylococcus aureus: Secondary | ICD-10-CM

## 2014-03-05 DIAGNOSIS — M79609 Pain in unspecified limb: Secondary | ICD-10-CM

## 2014-03-05 DIAGNOSIS — T148XXA Other injury of unspecified body region, initial encounter: Secondary | ICD-10-CM

## 2014-03-05 DIAGNOSIS — L089 Local infection of the skin and subcutaneous tissue, unspecified: Secondary | ICD-10-CM

## 2014-03-05 DIAGNOSIS — Z720 Tobacco use: Secondary | ICD-10-CM

## 2014-03-05 DIAGNOSIS — L98499 Non-pressure chronic ulcer of skin of other sites with unspecified severity: Secondary | ICD-10-CM

## 2014-03-05 DIAGNOSIS — I739 Peripheral vascular disease, unspecified: Secondary | ICD-10-CM | POA: Diagnosis present

## 2014-03-05 DIAGNOSIS — E1169 Type 2 diabetes mellitus with other specified complication: Secondary | ICD-10-CM | POA: Diagnosis not present

## 2014-03-05 DIAGNOSIS — L24A9 Irritant contact dermatitis due friction or contact with other specified body fluids: Secondary | ICD-10-CM

## 2014-03-05 DIAGNOSIS — I255 Ischemic cardiomyopathy: Secondary | ICD-10-CM | POA: Diagnosis not present

## 2014-03-05 HISTORY — DX: Non-ST elevation (NSTEMI) myocardial infarction: I21.4

## 2014-03-05 LAB — CBC WITH DIFFERENTIAL/PLATELET
BASOS ABS: 0 10*3/uL (ref 0.0–0.1)
BASOS PCT: 0 % (ref 0–1)
Eosinophils Absolute: 0 10*3/uL (ref 0.0–0.7)
Eosinophils Relative: 0 % (ref 0–5)
HEMATOCRIT: 36.3 % (ref 36.0–46.0)
Hemoglobin: 11.5 g/dL — ABNORMAL LOW (ref 12.0–15.0)
LYMPHS PCT: 4 % — AB (ref 12–46)
Lymphs Abs: 0.7 10*3/uL (ref 0.7–4.0)
MCH: 22.3 pg — ABNORMAL LOW (ref 26.0–34.0)
MCHC: 31.7 g/dL (ref 30.0–36.0)
MCV: 70.5 fL — ABNORMAL LOW (ref 78.0–100.0)
MONO ABS: 1.1 10*3/uL — AB (ref 0.1–1.0)
Monocytes Relative: 7 % (ref 3–12)
NEUTROS ABS: 15.3 10*3/uL — AB (ref 1.7–7.7)
NEUTROS PCT: 89 % — AB (ref 43–77)
Platelets: 305 10*3/uL (ref 150–400)
RBC: 5.15 MIL/uL — ABNORMAL HIGH (ref 3.87–5.11)
RDW: 18.3 % — ABNORMAL HIGH (ref 11.5–15.5)
WBC: 17.1 10*3/uL — AB (ref 4.0–10.5)

## 2014-03-05 LAB — COMPREHENSIVE METABOLIC PANEL
ALBUMIN: 3 g/dL — AB (ref 3.5–5.2)
ALK PHOS: 78 U/L (ref 39–117)
ALT: 11 U/L (ref 0–35)
AST: 31 U/L (ref 0–37)
Anion gap: 13 (ref 5–15)
BUN: 13 mg/dL (ref 6–23)
CALCIUM: 8.8 mg/dL (ref 8.4–10.5)
CO2: 25 mEq/L (ref 19–32)
Chloride: 92 mEq/L — ABNORMAL LOW (ref 96–112)
Creatinine, Ser: 0.6 mg/dL (ref 0.50–1.10)
GFR calc non Af Amer: >90 mL/min (ref >90)
GLUCOSE: 175 mg/dL — AB (ref 70–99)
POTASSIUM: 3.8 meq/L (ref 3.7–5.3)
SODIUM: 130 meq/L — AB (ref 137–147)
TOTAL PROTEIN: 6.6 g/dL (ref 6.0–8.3)
Total Bilirubin: 0.7 mg/dL (ref 0.3–1.2)

## 2014-03-05 LAB — I-STAT CHEM 8, ED
BUN: 12 mg/dL (ref 6–23)
CHLORIDE: 93 meq/L — AB (ref 96–112)
Calcium, Ion: 1.16 mmol/L (ref 1.12–1.23)
Creatinine, Ser: 0.8 mg/dL (ref 0.50–1.10)
Glucose, Bld: 201 mg/dL — ABNORMAL HIGH (ref 70–99)
HEMATOCRIT: 41 % (ref 36.0–46.0)
HEMOGLOBIN: 13.9 g/dL (ref 12.0–15.0)
Potassium: 3.7 mEq/L (ref 3.7–5.3)
SODIUM: 130 meq/L — AB (ref 137–147)
TCO2: 26 mmol/L (ref 0–100)

## 2014-03-05 LAB — URINALYSIS, ROUTINE W REFLEX MICROSCOPIC
Bilirubin Urine: NEGATIVE
GLUCOSE, UA: NEGATIVE mg/dL
HGB URINE DIPSTICK: NEGATIVE
KETONES UR: NEGATIVE mg/dL
Leukocytes, UA: NEGATIVE
Nitrite: NEGATIVE
PH: 5 (ref 5.0–8.0)
PROTEIN: NEGATIVE mg/dL
Specific Gravity, Urine: 1.02 (ref 1.005–1.030)
Urobilinogen, UA: 1 mg/dL (ref 0.0–1.0)

## 2014-03-05 LAB — GLUCOSE, CAPILLARY: GLUCOSE-CAPILLARY: 184 mg/dL — AB (ref 70–99)

## 2014-03-05 LAB — I-STAT CG4 LACTIC ACID, ED: Lactic Acid, Venous: 3.04 mmol/L — ABNORMAL HIGH (ref 0.5–2.2)

## 2014-03-05 MED ORDER — ACETAMINOPHEN 650 MG RE SUPP: 650.0000 mg | Freq: Four times a day (QID) | RECTAL | Status: DC | PRN

## 2014-03-05 MED ORDER — ENOXAPARIN SODIUM 40 MG/0.4ML ~~LOC~~ SOLN: 40.0000 mg | SUBCUTANEOUS | Status: DC

## 2014-03-05 MED ORDER — ONDANSETRON HCL 4 MG PO TABS: 4.0000 mg | ORAL_TABLET | Freq: Four times a day (QID) | ORAL | Status: DC | PRN

## 2014-03-05 MED ORDER — ACETAMINOPHEN 325 MG PO TABS
650.0000 mg | ORAL_TABLET | Freq: Once | ORAL | Status: AC
  Administered 2014-03-05: 650 mg via ORAL
  Filled 2014-03-05: qty 2

## 2014-03-05 MED ORDER — ALPRAZOLAM 0.5 MG PO TABS: 1.0000 mg | ORAL_TABLET | Freq: Four times a day (QID) | ORAL | Status: DC | PRN

## 2014-03-05 MED ORDER — FENTANYL CITRATE 0.05 MG/ML IJ SOLN
50.0000 ug | INTRAMUSCULAR | Status: DC | PRN
  Filled 2014-03-05 (×2): qty 2

## 2014-03-05 MED ORDER — SODIUM CHLORIDE 0.9 % IV SOLN
INTRAVENOUS | Status: DC
  Administered 2014-03-05 (×3): via INTRAVENOUS

## 2014-03-05 MED ORDER — IOHEXOL 300 MG/ML  SOLN
100.0000 mL | Freq: Once | INTRAMUSCULAR | Status: AC | PRN
  Administered 2014-03-05: 100 mL via INTRAVENOUS

## 2014-03-05 MED ORDER — VANCOMYCIN HCL IN DEXTROSE 1-5 GM/200ML-% IV SOLN
1000.0000 mg | Freq: Three times a day (TID) | INTRAVENOUS | Status: DC
  Administered 2014-03-06 – 2014-03-09 (×10): 1000 mg via INTRAVENOUS
  Filled 2014-03-05 (×14): qty 200

## 2014-03-05 MED ORDER — SODIUM CHLORIDE 0.9 % IV BOLUS (SEPSIS)
500.0000 mL | Freq: Once | INTRAVENOUS | Status: AC
  Administered 2014-03-05: 500 mL via INTRAVENOUS

## 2014-03-05 MED ORDER — DEXTROSE 5 % IV SOLN
1.0000 g | Freq: Three times a day (TID) | INTRAVENOUS | Status: DC
  Administered 2014-03-06 – 2014-03-07 (×4): 1 g via INTRAVENOUS
  Filled 2014-03-05 (×7): qty 1

## 2014-03-05 MED ORDER — INSULIN ASPART 100 UNIT/ML ~~LOC~~ SOLN
0.0000 [IU] | Freq: Three times a day (TID) | SUBCUTANEOUS | Status: DC
  Administered 2014-03-06: 3 [IU] via SUBCUTANEOUS
  Administered 2014-03-06 (×2): 2 [IU] via SUBCUTANEOUS

## 2014-03-05 MED ORDER — GLIMEPIRIDE 2 MG PO TABS
2.0000 mg | ORAL_TABLET | Freq: Two times a day (BID) | ORAL | Status: DC
  Administered 2014-03-05: 2 mg via ORAL
  Filled 2014-03-05 (×3): qty 1

## 2014-03-05 MED ORDER — ACETAMINOPHEN 325 MG PO TABS: 650.0000 mg | ORAL_TABLET | Freq: Once | ORAL | Status: DC

## 2014-03-05 MED ORDER — ATORVASTATIN CALCIUM 20 MG PO TABS: 20.0000 mg | ORAL_TABLET | Freq: Every day | ORAL | Status: DC

## 2014-03-05 MED ORDER — SODIUM CHLORIDE 0.9 % IV BOLUS (SEPSIS)
1000.0000 mL | Freq: Once | INTRAVENOUS | Status: AC
  Administered 2014-03-05: 1000 mL via INTRAVENOUS

## 2014-03-05 MED ORDER — ASPIRIN EC 325 MG PO TBEC: 325.0000 mg | DELAYED_RELEASE_TABLET | Freq: Every day | ORAL | Status: DC

## 2014-03-05 MED ORDER — OXYCODONE HCL 5 MG PO TABS: 30.0000 mg | ORAL_TABLET | ORAL | Status: DC | PRN

## 2014-03-05 MED ORDER — SODIUM CHLORIDE 0.9 % IV SOLN
500.0000 mg | Freq: Once | INTRAVENOUS | Status: DC
  Filled 2014-03-05 (×2): qty 500

## 2014-03-05 MED ORDER — SODIUM CHLORIDE 0.9 % IV SOLN
INTRAVENOUS | Status: AC
  Administered 2014-03-06 (×2): via INTRAVENOUS

## 2014-03-05 MED ORDER — GABAPENTIN 600 MG PO TABS
600.0000 mg | ORAL_TABLET | Freq: Three times a day (TID) | ORAL | Status: DC
  Administered 2014-03-05 – 2014-03-06 (×3): 600 mg via ORAL
  Filled 2014-03-05 (×4): qty 1

## 2014-03-05 MED ORDER — ONDANSETRON HCL 4 MG/2ML IJ SOLN: 4.0000 mg | Freq: Four times a day (QID) | INTRAMUSCULAR | Status: DC | PRN

## 2014-03-05 MED ORDER — FLUTICASONE PROPIONATE 50 MCG/ACT NA SUSP
1.0000 | Freq: Every day | NASAL | Status: DC | PRN
  Filled 2014-03-05: qty 16

## 2014-03-05 MED ORDER — ACETAMINOPHEN 325 MG PO TABS
650.0000 mg | ORAL_TABLET | Freq: Four times a day (QID) | ORAL | Status: DC | PRN
Start: 2014-03-05 — End: 2014-03-10
  Administered 2014-03-06: 650 mg via ORAL
  Filled 2014-03-05: qty 2

## 2014-03-05 MED ORDER — DARIFENACIN HYDROBROMIDE ER 7.5 MG PO TB24
7.5000 mg | ORAL_TABLET | Freq: Every day | ORAL | Status: DC
  Administered 2014-03-07 – 2014-03-13 (×6): 7.5 mg via ORAL
  Filled 2014-03-05 (×10): qty 1

## 2014-03-05 MED ORDER — VANCOMYCIN HCL IN DEXTROSE 1-5 GM/200ML-% IV SOLN
1000.0000 mg | Freq: Once | INTRAVENOUS | Status: AC
  Administered 2014-03-05: 1000 mg via INTRAVENOUS
  Filled 2014-03-05: qty 200

## 2014-03-05 MED ORDER — ESCITALOPRAM OXALATE 10 MG PO TABS
10.0000 mg | ORAL_TABLET | Freq: Every day | ORAL | Status: DC
  Filled 2014-03-05: qty 1

## 2014-03-05 MED ORDER — DEXTROSE 5 % IV SOLN
1.0000 g | Freq: Once | INTRAVENOUS | Status: AC
  Administered 2014-03-05: 1 g via INTRAVENOUS
  Filled 2014-03-05: qty 1

## 2014-03-05 NOTE — ED Notes (Signed)
Doppler being completed at bedside

## 2014-03-05 NOTE — H&P (Addendum)
[facility] History and Physical  [name] UJW:[id number] DOB: 01-09-1956 DOA: 03/05/2014  Referring physician: ER physician. PCP: Kirk Ruths, MD   Chief Complaint: Left leg pain.  HPI: [name] is a 58 y.o. female with history of peripheral vascular disease who has had a recent right lower extremity runoff presented to the ER because of worsening left lower extremity pain over the last 2 days. In the ER patient was seen by vascular surgeon Dr. Imogene Burn who at this time from the patient's left lower extremity pain was vascular in origin and can be seen as outpatient. In the ER patient was found to be hypotensive and febrile. Blood cultures were obtained along with urine cultures. Chest x-ray and UA were unremarkable. On exam patient had mild discharge from the right groin wound which patient states has been there for last 6 weeks. Patient also has mild skin discoloration in the left groin area where she had catheterization and was weak. Patient states she has been having some abdominal discomfort for over the last 2 months mostly in the epigastric area. Patient has been admitted for sepsis source not clear. Patient otherwise denies any chest pain shortness of breath diarrhea fever chills or any headache or visual symptoms. Patient was found to be tachycardic.   Review of Systems: As presented in the history of presenting illness, rest negative.  Past Medical History  Diagnosis Date  . Diabetes mellitus   . Peripheral vascular disease   . Hyperlipidemia   . Obesity   . Hypertension   . Arthritis   . Ulcer of ankle     right  . Coronary artery disease   . COPD (chronic obstructive pulmonary disease)    Past Surgical History  Procedure Laterality Date  . Cholecystectomy    . Femoral-tibial bypass graft Right 09/27/2013    Procedure: BYPASS GRAFT FEMORAL-TIBIAL ARTERY-RIGHT;  Surgeon: Nada Libman, MD;  Location: [facility] OR;  Service: Vascular;  Laterality: Right;  .  Abdominal aorta stent Right   . Bypass graft popliteal to tibial Right   . Coronary angioplasty with stent placement      proximcal RCA stent ~ 2001  . Amputation Right 10/14/2013    Procedure: AMPUTATION RAY;  Surgeon: Nadara Mustard, MD;  Location: [facility] OR;  Service: Orthopedics;  Laterality: Right;  Right 2nd Ray Amputation, Theraskin Graft Right Ankle   Social History:  reports that she has been smoking Cigarettes.  She has a 30 pack-year smoking history. She has never used smokeless tobacco. She reports that she does not drink alcohol or use illicit drugs. Where does patient live home. Can patient participate in ADLs? Yes.  Allergies  Allergen Reactions  . Penicillins Hives    Tolerates cefepime  . Adhesive [Tape] Rash    Family History:  Family History  Problem Relation Age of Onset  . Cancer Mother   . Heart disease Father   . Hyperlipidemia Father   . Hypertension Father   . Other Father     varicose veins  . Heart attack Father   . Heart disease Sister   . Hyperlipidemia Sister   . Hypertension Sister   . Other Sister     varicose veins  . Heart attack Sister   . Heart disease Brother   . Hypertension Brother   . Hyperlipidemia Brother   . Other Brother     varicose veins  . Heart attack Brother       Prior to Admission medications  Medication Sig Start Date End Date Taking? Authorizing Provider  alprazolam Prudy Feeler) 2 MG tablet Take 1 mg by mouth every 6 (six) hours as needed for sleep or anxiety.  04/06/12   Historical Provider, MD  aspirin EC 325 MG tablet Take 325 mg by mouth daily.    Historical Provider, MD  atorvastatin (LIPITOR) 20 MG tablet Take 20 mg by mouth daily. 03/08/12   Historical Provider, MD  escitalopram (LEXAPRO) 10 MG tablet Take 10 mg by mouth daily. 05/02/12   Historical Provider, MD  fentaNYL (DURAGESIC - DOSED MCG/HR) 100 MCG/HR Place 100 mcg onto the skin every 3 (three) days.    Historical Provider, MD  fluticasone (FLONASE) 50 MCG/ACT  nasal spray Place 1 spray into both nostrils daily as needed for allergies.  07/21/13   Historical Provider, MD  gabapentin (NEURONTIN) 600 MG tablet Take 600 mg by mouth 3 (three) times daily.    Historical Provider, MD  glimepiride (AMARYL) 2 MG tablet Take 2 mg by mouth 2 (two) times daily.    Historical Provider, MD  hydrochlorothiazide (MICROZIDE) 12.5 MG capsule Take 12.5 mg by mouth daily.    Historical Provider, MD  meloxicam (MOBIC) 15 MG tablet Take 15 mg by mouth daily.    Historical Provider, MD  metFORMIN (GLUCOPHAGE) 500 MG tablet Take 1,000 mg by mouth 2 (two) times daily with a meal.    Historical Provider, MD  oxycodone (ROXICODONE) 30 MG immediate release tablet Take 30 mg by mouth every 4 (four) hours as needed for pain.     Historical Provider, MD  quinapril (ACCUPRIL) 20 MG tablet Take 20 mg by mouth daily.    Historical Provider, MD  VESICARE 5 MG tablet Take 5 mg by mouth daily.  11/20/13   Historical Provider, MD    Physical Exam: Filed Vitals:   03/05/14 1949 03/05/14 2000 03/05/14 2015 03/05/14 2030  BP: 114/71 100/44 99/48 93/61   Pulse: 120 117 120 119  Temp: 101.9 F (38.8 C)     TempSrc: Rectal     Resp: 35  SpO2: 95% 96% 90% 89%     General:  Well-developed and nourished.  Eyes: Anicteric no pallor.  ENT: No discharge from the ears eyes nose mouth.  Neck: No mass felt. JVD not appreciated.  Cardiovascular: S1-S2 heard tachycardic.  Respiratory: No rhonchi or crepitations.  Abdomen: Soft nontender bowel sounds present. Ecchymosis in the left flank area.  Skin: There is a small ulcer in the right groin area with mild discharge. Ecchymotic area in the left groin area. Ulcer on the right foot on the medial aspect of the heel and toe.  Musculoskeletal: Chronic skin changes in the lower extremity.  Psychiatric: Appears normal.  Neurologic: Alert awake oriented to time place and person. Moves all extremities.  Labs on Admission:  Basic  Metabolic Panel:  Recent Labs Lab 03/01/14 0708 03/05/14 1225 03/05/14 1650  NA 137 130* 130*  K 3.6* 3.7 3.8  CL 101 93* 92*  CO2  --   --  25  GLUCOSE 155* 201* 175*  BUN CREATININE 0.60 0.80 0.60  CALCIUM  --   --  8.8   Liver Function Tests:  Recent Labs Lab 03/05/14 1650  AST 31  ALT 11  ALKPHOS 78  BILITOT 0.7  PROT 6.6  ALBUMIN 3.0*   No results found for this basename: LIPASE, AMYLASE,  in the last 168 hours No results found for this basename: AMMONIA,  in the last 168 hours CBC:  Recent Labs Lab 03/01/14 0708 03/05/14 1210 03/05/14 1225  WBC  --  17.1*  --   NEUTROABS  --  15.3*  --   HGB 15.3* 11.5* 13.9  HCT 45.0 36.3 41.0  MCV  --  70.5*  --   PLT  --  305  --    Cardiac Enzymes: No results found for this basename: CKTOTAL, CKMB, CKMBINDEX, TROPONINI,  in the last 168 hours  BNP (last 3 results) No results found for this basename: PROBNP,  in the last 8760 hours CBG:  Recent Labs Lab 03/01/14 0640 03/01/14 1026  GLUCAP 153* 127*    Radiological Exams on Admission: Dg Chest Portable 1 View  03/05/2014   CLINICAL DATA:  Leg pain.  Smoker.  EXAM: PORTABLE CHEST - 1 VIEW  COMPARISON:  Chest radiograph 09/25/2013  FINDINGS: Cardiac leads project over the chest. Cardiomediastinal silhouette is stable. There is atherosclerotic calcification in the thoracic aortic arch. Lung volumes are low resulting in crowding of pulmonary vascular markings. No airspace disease or pulmonary edema is identified. No acute bony abnormality appreciated.  IMPRESSION: Low lung volumes.  No acute findings identified.  Atherosclerosis of the thoracic aorta.   Electronically Signed   By: Britta Mccreedy M.D.   On: 03/05/2014 19:15    EKG: Independently reviewed. Sinus tachycardia.   Assessment/Plan Principal Problem:   Sepsis Active Problems:   Left leg pain   Anemia   Diabetes mellitus, controlled   1. Sepsis - source not clear could be from the right  groin area. Patient also has been complaining of increasing pain in the left lower extremity. Patient has had previous history of osteomyelitis. At this time blood cultures urine cultures and sedimentation rate has been ordered. Check x-rays of the left leg and foot to rule out any osteomyelitis. Patient has been empirically placed on vancomycin cefepime. Continue aggressive hydration. Since patient is complaining of some abdominal discomfort I ordered CT abdomen and pelvis. Check lipase. Consult wound team. 2. Left leg pain - as per vascular surgeon patient's pain is not vascular in origin. See #1. 3. Diabetes mellitus2 -  hold metformin for now and patient has been placed on sliding-scale coverage. 4. History of chronic pain - at this time I will discontinue patient's Duragesic patch and will place on when necessary pain medications. 5. Mild anemia - closely follow CBC. 6. Hypertension history presently hypotensive secondary to sepsis.   I have discussed with on-call pulmonary critical care about this admission.  Addendum - patient's troponin came positive and I have consulted on call cardiologist and patient has been started on heparin infusion along with aspirin and increase dose of Lipitor. Patient cannot be on beta blocker secondary to hypotension. Check 2-D echo. CAT scan of the abdomen and pelvis showed small left inguinal hematoma which was not extending to the peritoneal cavity and I have discussed with Dr. Johny Drilling and at this time Dr. Johny Drilling has advised is no contraindication for heparin from their standpoint of view.  Code Status: Full code.  Family Communication: None.  Disposition Plan: Admit to inpatient.    Xitlally Mooneyham N. [facility] Pager [phone].  If 7PM-7AM, please contact night-coverage [email or web address] Password [email or web address] 03/05/2014, 9:02 PM

## 2014-03-05 NOTE — Progress Notes (Addendum)
ANTIBIOTIC CONSULT NOTE - INITIAL  Pharmacy Consult for cefepime, vancomycin Indication: sepsis  Allergies  Allergen Reactions  . Penicillins Hives    Tolerates cefepime  . Adhesive [Tape] Rash    Vital Signs: Temp: 102.6 F (39.2 C) (09/13 1648) Temp src: Rectal (09/13 1648) BP: 86/53 mmHg (09/13 1730) Pulse Rate: 116 (09/13 1730) Intake/Output from previous day:   Intake/Output from this shift:    Labs:  Recent Labs  03/05/14 1210 03/05/14 1225 03/05/14 1650  WBC 17.1*  --   --   HGB 11.5* 13.9  --   PLT 305  --   --   CREATININE  --  0.80 0.60   The CrCl is unknown because both a height and weight (above a minimum accepted value) are required for this calculation. No results found for this basename: VANCOTROUGH, VANCOPEAK, VANCORANDOM, GENTTROUGH, GENTPEAK, GENTRANDOM, TOBRATROUGH, TOBRAPEAK, TOBRARND, AMIKACINPEAK, AMIKACINTROU, AMIKACIN,  in the last 72 hours   Microbiology: No results found for this or any previous visit (from the past 720 hour(s)).  Medical History: Past Medical History  Diagnosis Date  . Diabetes mellitus   . Peripheral vascular disease   . Hyperlipidemia   . Obesity   . Hypertension   . Arthritis   . Ulcer of ankle     right  . Coronary artery disease   . COPD (chronic obstructive pulmonary disease)     Assessment: 58 yo female here with leg pain and noted with recent vascular catheterization procedure. Patient also noted with fever and to begin cefepime and vancomycin for possible sepsis. WBC= 17.1, tmax= 102.6, SCr= 0.6 and CrCl > 100. Patient noted with PCN allergy (tolerates cefepime). Vancomycin  IV has been ordered in the ED   9/13 cefepime>> 9/13 vanc>>  9/13 blood x2 9/13 urine  Plan:  -Cefepime 1gm IV q8h -Vancomycin  IV (for total load of ) followed by  IV q8h -Will follow renal function, cultures and clinical progress  Harland German, Pharm D 03/05/2014 6:28 PM

## 2014-03-05 NOTE — Discharge Instructions (Signed)
Do not take your hydrochlorothiazide, or quinapril, for 2 days. You can restart these medications, on 03/08/14.  Get plenty of rest, and drinking lots of fluids.  Return here if needed for problems.

## 2014-03-05 NOTE — Progress Notes (Addendum)
VASCULAR LAB PRELIMINARY  PRELIMINARY  PRELIMINARY  PRELIMINARY  Left lower extremity arterial duplex completed.    Preliminary report:  There is significant plaque noted throughout the left lower extremity arterial system.  Several areas of <50% stenosis noted in the mid to distal femoral artery, but no areas of occlusion noted. Waveforms are monophasic throughout.   Jaimi Belle, RVT 03/05/2014, 5:35 PM

## 2014-03-05 NOTE — ED Provider Notes (Signed)
CSN: [id number]     Arrival date & time 03/05/14  1134 History   First MD Initiated Contact with Patient 03/05/14 1152     Chief Complaint  Patient presents with  . Leg Pain     (Consider location/radiation/quality/duration/timing/severity/associated sxs/prior Treatment) Patient is a 58 y.o. female presenting with leg pain. The history is provided by the patient.  Leg Pain  She presents for evaluation of left flank pain, which started after her recent vascular catheterization procedure. She has pain in the entire left leg. The pain is worse, with attempts at ambulation. The pain began gradually, last night. She denies fever, chills, nausea, vomiting, weakness, or dizziness. She took her usual medications, and this morning. She has not had any relief of her discomfort. There are no known modifying factors.  Past Medical History  Diagnosis Date  . Diabetes mellitus   . Peripheral vascular disease   . Hyperlipidemia   . Obesity   . Hypertension   . Arthritis   . Ulcer of ankle     right  . Coronary artery disease   . COPD (chronic obstructive pulmonary disease)    Past Surgical History  Procedure Laterality Date  . Cholecystectomy    . Femoral-tibial bypass graft Right 09/27/2013    Procedure: BYPASS GRAFT FEMORAL-TIBIAL ARTERY-RIGHT;  Surgeon: Nada Libman, MD;  Location: [facility] OR;  Service: Vascular;  Laterality: Right;  . Abdominal aorta stent Right   . Bypass graft popliteal to tibial Right   . Coronary angioplasty with stent placement      proximcal RCA stent ~ 2001  . Amputation Right 10/14/2013    Procedure: AMPUTATION RAY;  Surgeon: Nadara Mustard, MD;  Location: [facility] OR;  Service: Orthopedics;  Laterality: Right;  Right 2nd Ray Amputation, Theraskin Graft Right Ankle   Family History  Problem Relation Age of Onset  . Cancer Mother   . Heart disease Father   . Hyperlipidemia Father   . Hypertension Father   . Other Father     varicose veins  . Heart attack Father   .  Heart disease Sister   . Hyperlipidemia Sister   . Hypertension Sister   . Other Sister     varicose veins  . Heart attack Sister   . Heart disease Brother   . Hypertension Brother   . Hyperlipidemia Brother   . Other Brother     varicose veins  . Heart attack Brother    History  Substance Use Topics  . Smoking status: Light Tobacco Smoker -- 1.00 packs/day for 30 years    Types: Cigarettes  . Smokeless tobacco: Never Used     Comment: pt states that she is not quitting smoking and refused smoking cessation materials  . Alcohol Use: No   OB History   Grav Para Term Preterm Abortions TAB SAB Ect Mult Living                 Review of Systems  All other systems reviewed and are negative.     Allergies  Penicillins and Adhesive  Home Medications   Prior to Admission medications   Medication Sig Start Date End Date Taking? Authorizing Provider  alprazolam Prudy Feeler) 2 MG tablet Take 1 mg by mouth every 6 (six) hours as needed for sleep or anxiety.  04/06/12   Historical Provider, MD  aspirin EC 325 MG tablet Take 325 mg by mouth daily.    Historical Provider, MD  atorvastatin (LIPITOR) 20 MG tablet Take  20 mg by mouth daily. 03/08/12   Historical Provider, MD  escitalopram (LEXAPRO) 10 MG tablet Take 10 mg by mouth daily. 05/02/12   Historical Provider, MD  fentaNYL (DURAGESIC - DOSED MCG/HR) 100 MCG/HR Place 100 mcg onto the skin every 3 (three) days.    Historical Provider, MD  fluticasone (FLONASE) 50 MCG/ACT nasal spray Place 1 spray into both nostrils daily as needed for allergies.  07/21/13   Historical Provider, MD  gabapentin (NEURONTIN) 600 MG tablet Take 600 mg by mouth 3 (three) times daily.    Historical Provider, MD  glimepiride (AMARYL) 2 MG tablet Take 2 mg by mouth 2 (two) times daily.    Historical Provider, MD  hydrochlorothiazide (MICROZIDE) 12.5 MG capsule Take 12.5 mg by mouth daily.    Historical Provider, MD  meloxicam (MOBIC) 15 MG tablet Take 15 mg by  mouth daily.    Historical Provider, MD  metFORMIN (GLUCOPHAGE) 500 MG tablet Take 1,000 mg by mouth 2 (two) times daily with a meal.    Historical Provider, MD  oxycodone (ROXICODONE) 30 MG immediate release tablet Take 30 mg by mouth every 4 (four) hours as needed for pain.     Historical Provider, MD  quinapril (ACCUPRIL) 20 MG tablet Take 20 mg by mouth daily.    Historical Provider, MD  VESICARE 5 MG tablet Take 5 mg by mouth daily.  11/20/13   Historical Provider, MD   BP 86/53  Pulse 116  Temp(Src) 102.6 F (39.2 C) (Rectal)  Resp 20  SpO2 96% Physical Exam  Nursing note and vitals reviewed. Constitutional: She is oriented to person, place, and time. She appears well-developed.  Appears older than stated age  HENT:  Head: Normocephalic and atraumatic.  Eyes: Conjunctivae and EOM are normal. Pupils are equal, round, and reactive to light.  Neck: Normal range of motion and phonation normal. Neck supple.  Cardiovascular: Normal rate, regular rhythm and intact distal pulses.   Is no palpable pulse in the left foot. With the Doppler. There is a 1+ dorsalis pedis pulse, which is monophasic.  Pulmonary/Chest: Effort normal. No respiratory distress. She exhibits no tenderness.  Decreased air movement bilaterally  Abdominal: Soft. She exhibits no distension. There is no tenderness. There is no guarding.  Ecchymosis of left panniclus, without tenderness.  Musculoskeletal: Normal range of motion. She exhibits no edema.  Left leg is warm to touch. Left leg is sensate all the way to the toes. There are no areas of focal left leg tenderness. There are no areas of apparent erythema or induration of the left leg, or foot. No discrete left groin mass or redness.  Neurological: She is alert and oriented to person, place, and time. She exhibits normal muscle tone.  Skin: Skin is warm and dry.  Psychiatric: She has a normal mood and affect. Her behavior is normal. Judgment and thought content normal.     ED Course  Procedures (including critical care time) Labs Review  Medications  0.9 %  sodium chloride infusion ( Intravenous New Bag/Given 03/05/14 1737)  sodium chloride 0.9 % bolus 500 mL (0 mLs Intravenous Stopped 03/05/14 1251)  sodium chloride 0.9 % bolus 500 mL (0 mLs Intravenous Stopped 03/05/14 1737)  acetaminophen (TYLENOL) tablet 650 mg (650 mg Oral Given 03/05/14 1715)    Patient Vitals for the past 24 hrs:  BP Temp Temp src Pulse Resp SpO2  03/05/14 1730 86/53 mmHg - - 116 20 96 %  03/05/14 1717 79/43 mmHg - -  118 17 97 %  03/05/14 1715 - - - - 23 96 %  03/05/14 1700 102/51 mmHg - - 125 22 93 %  03/05/14 1648 - 102.6 F (39.2 C) Rectal - - -  03/05/14 1645 90/71 mmHg - - - 25 -  03/05/14 1630 92/43 mmHg - - 120 16 95 %  03/05/14 1615 91/43 mmHg - - 120 15 97 %  03/05/14 1600 95/50 mmHg - - 118 17 95 %  03/05/14 1545 99/66 mmHg - - 120 26 93 %  03/05/14 1530 102/51 mmHg - - 125 21 97 %  03/05/14 1515 103/61 mmHg - - 120 28 96 %  03/05/14 1500 103/49 mmHg - - 118 14 99 %  03/05/14 1445 107/55 mmHg - - - 23 -  03/05/14 1430 93/50 mmHg - - 114 19 94 %  03/05/14 1416 90/45 mmHg 98.8 F (37.1 C) Oral 110 22 98 %  03/05/14 1415 90/45 mmHg - - - 13 -  03/05/14 1400 93/44 mmHg - - - 20 -  03/05/14 1347 - - - 115 - -  03/05/14 1345 90/44 mmHg - - - 17 -  03/05/14 1330 93/45 mmHg - - 116 19 96 %  03/05/14 1326 95/50 mmHg - - 112 17 98 %  03/05/14 1315 91/37 mmHg - - 111 17 99 %  03/05/14 1300 91/36 mmHg - - 112 18 100 %  03/05/14 1245 89/37 mmHg - - 113 15 99 %  03/05/14 1230 87/36 mmHg - - 110 18 100 %  03/05/14 1215 88/44 mmHg - - 110 17 99 %  03/05/14 1214 96/46 mmHg - - 111 17 99 %  03/05/14 1201 96/46 mmHg - - - - -  03/05/14 1151 75/36 mmHg 98.2 F (36.8 C) Oral 110 18 99 %   13:58- vascular surgeon, Dr. Imogene Burn, will evaluate the patient, in ED  1:37 PM Reevaluation with update and discussion. After initial assessment and treatment, an updated evaluation  reveals she is pain-free at this time. Doppler interrogation left foot, by me, reveals 1+ pulse. Prisilla Kocsis L    4:31 PM Reevaluation with update and discussion. After initial assessment and treatment, an updated evaluation reveals she is comfortable. Now, has mild, left foot pain. Heart rate is about 120 and blood pressure is soft. Patient is hungry now and thirsty. She would like to try oral treatments, and followup with her PCP. She admits to not eating and drinking as much in the last 2 days. During this time. She has continued taking her usual medications. She has a walker that she continues to ambulate, at home. She is given oral fluid trial here in the emergency department. Shayn Madole L   Labs Reviewed  CBC WITH DIFFERENTIAL - Abnormal; Notable for the following:    WBC 17.1 (*)    RBC 5.15 (*)    Hemoglobin 11.5 (*)    MCV 70.5 (*)    MCH 22.3 (*)    RDW 18.3 (*)    Neutrophils Relative % 89 (*)    Neutro Abs 15.3 (*)    Lymphocytes Relative 4 (*)    Monocytes Absolute 1.1 (*)    All other components within normal limits  URINALYSIS, ROUTINE W REFLEX MICROSCOPIC - Abnormal; Notable for the following:    APPearance CLOUDY (*)    All other components within normal limits  I-STAT CG4 LACTIC ACID, ED - Abnormal; Notable for the following:    Lactic Acid, Venous 3.04 (*)  All other components within normal limits  I-STAT CHEM 8, ED - Abnormal; Notable for the following:    Sodium 130 (*)    Chloride 93 (*)    Glucose, Bld 201 (*)    All other components within normal limits  URINE CULTURE  COMPREHENSIVE METABOLIC PANEL    Imaging Review No results found.   EKG Interpretation   Date/Time:  Sunday March 05 2014 20:53:53 EDT Ventricular Rate:  119 PR Interval:  157 QRS Duration: 101 QT Interval:  327 QTC Calculation: 460 R Axis:   67 Text Interpretation:  Sinus tachycardia Since last tracing rate faster  Confirmed by Antonis Lor  MD, Katharyn Schauer ([id number]) on 03/05/2014  9:50:37 PM      MDM   Final diagnoses:  Leg pain, left  Febrile illness    Nonspecific left leg pain with recent arterial catheterization. Source of income is not clear but may be related to a  Left groin hematoma which is not in either,  arterial or venous system. Electrolytes indicate mild dehydration.    Nursing Notes Reviewed/ Care Coordinated Applicable Imaging Reviewed Interpretation of Laboratory Data incorporated into ED treatment  16:50- After initial decision made to D/C pt., the d/c vitals revealed a fever and persistent low BP, despite IV fluids and oral fluids. U/A ordered as possible source of infection. Care to Dr. Gwendolyn Grant to evaluate pt., after return of Urinalysis, and additional IVF.    Flint Melter, MD 03/05/14 2151

## 2014-03-05 NOTE — Progress Notes (Addendum)
   Daily Progress Note  Assessment/Planning: S/p R leg runoff via L CFA cannulation, failed recannulation of distal R fem-TPT/PT bypass complicated with extensive subcutaneous hematoma   UnclearMyra Gianottime the etiology of this patient's L leg pain  She has no evidence of threatened limb  Would obtain L femoral arterial duplex and if non-occluded, pt can follow up with Dr. Brabham as an outpatient.  Subjective    Pt complaining of pain L thigh  Objective Filed Vitals:   03/05/14 1415 03/05/14 1416 03/05/14 1430 03/05/14 1445  BP: 107/55  Pulse:  110 114   Temp:  98.8 F (37.1 C)    TempSrc:  Oral    Resp: SpO2:  98% 94%    No intake or output data in the 24 hours ending 03/05/14 1540  GI soft, abd, obese with large pannus, no TTP, -G/R  VASC  Large echymotic pannus overlying L CFA, extensive thigh and flank echymosis also, palpable L CFA without obvious enlarged pulse consistent with PSA or thrill, distal chronic skin changes c/w PAD, dopplerable L PT and AT signals  MS  Intact DF/PF in L foot, sensation also intact  Laboratory CBC    Component Value Date/Time   WBC 17.1* 03/05/2014 1210   HGB 13.9 03/05/2014 1225   HCT 41.0 03/05/2014 1225   PLT 305 03/05/2014 1210    BMET    Component Value Date/Time   NA 130* 03/05/2014 1225   K 3.7 03/05/2014 1225   CL 93* 03/05/2014 1225   CO2 29 10/12/2013 1017   GLUCOSE 201* 03/05/2014 1225   BUN 12 03/05/2014 1225   CREATININE 0.80 03/05/2014 1225   CREATININE 0.44* 07/22/2013 1612   CALCIUM 9.0 10/12/2013 1017   GFRNONAA >90 10/12/2013 1017   GFRAA >90 10/12/2013 1017    Kaitlin Sake, MD [facility] Office: [phone] Pager: [phone]  03/05/2014, 3:40 PM  Addendum  L femoral duplex demonstrates no PSA or AVF.  Nothing to account for sx in this patient.  Pt can f/u Dr. [name] as an outpatient.  Kaitlin Sake, MD [facility]  [place] Office: [phone] Pager: [phone]  03/05/2014, 7:14 PM

## 2014-03-05 NOTE — ED Notes (Signed)
Sister, Lelon Huh, left contact info. [phone]

## 2014-03-05 NOTE — ED Notes (Signed)
Pt reports that on Wednesday she had a cath done on her left leg to look at the veins. Reports that the doctor stated that she has narrowing on that side. Reports that she has pain in that left leg, but no discoloration. States that she is able to wiggle her toe but it hurts.

## 2014-03-05 NOTE — ED Notes (Signed)
NOTIFIED DR. Effie Shy IN PERSON FOR CG4+LACTIC ACID = 3.04 mmol/L , :45 PM ,03/05/2014.

## 2014-03-06 ENCOUNTER — Inpatient Hospital Stay (HOSPITAL_COMMUNITY): Payer: BC Managed Care – PPO

## 2014-03-06 DIAGNOSIS — G9340 Encephalopathy, unspecified: Secondary | ICD-10-CM

## 2014-03-06 DIAGNOSIS — R7989 Other specified abnormal findings of blood chemistry: Secondary | ICD-10-CM

## 2014-03-06 DIAGNOSIS — I959 Hypotension, unspecified: Secondary | ICD-10-CM

## 2014-03-06 DIAGNOSIS — J441 Chronic obstructive pulmonary disease with (acute) exacerbation: Secondary | ICD-10-CM

## 2014-03-06 DIAGNOSIS — I059 Rheumatic mitral valve disease, unspecified: Secondary | ICD-10-CM

## 2014-03-06 DIAGNOSIS — J189 Pneumonia, unspecified organism: Secondary | ICD-10-CM

## 2014-03-06 DIAGNOSIS — I2510 Atherosclerotic heart disease of native coronary artery without angina pectoris: Secondary | ICD-10-CM

## 2014-03-06 DIAGNOSIS — R000 Tachycardia, unspecified: Secondary | ICD-10-CM

## 2014-03-06 LAB — POCT I-STAT 3, ART BLOOD GAS (G3+)
Acid-base deficit: 9 mmol/L — ABNORMAL HIGH (ref 0.0–2.0)
Acid-base deficit: 8 mmol/L — ABNORMAL HIGH (ref 0.0–2.0)
Acid-base deficit: 6 mmol/L — ABNORMAL HIGH (ref 0.0–2.0)
BICARBONATE: 22.9 meq/L (ref 20.0–24.0)
Bicarbonate: 21.1 mEq/L (ref 20.0–24.0)
Bicarbonate: 20.3 mEq/L (ref 20.0–24.0)
O2 SAT: 94 %
O2 SAT: 94 %
O2 Saturation: 88 %
PCO2 ART: 83.7 mmHg — AB (ref 35.0–45.0)
PCO2 ART: 57.8 mmHg — AB (ref 35.0–45.0)
PCO2 ART: 41.5 mmHg (ref 35.0–45.0)
PH ART: 7.049 — AB (ref 7.350–7.450)
PH ART: 7.174 — AB (ref 7.350–7.450)
PO2 ART: 82 mmHg (ref 80.0–100.0)
Patient temperature: 99.6
TCO2: 25 mmol/L (ref 0–100)
TCO2: 23 mmol/L (ref 0–100)
TCO2: 22 mmol/L (ref 0–100)
pH, Arterial: 7.3 — ABNORMAL LOW (ref 7.350–7.450)
pO2, Arterial: 96 mmHg (ref 80.0–100.0)
pO2, Arterial: 80 mmHg (ref 80.0–100.0)

## 2014-03-06 LAB — LIPASE, BLOOD: Lipase: 8 U/L — ABNORMAL LOW (ref 11–59)

## 2014-03-06 LAB — LACTIC ACID, PLASMA
LACTIC ACID, VENOUS: 1.5 mmol/L (ref 0.5–2.2)
LACTIC ACID, VENOUS: 0.8 mmol/L (ref 0.5–2.2)
LACTIC ACID, VENOUS: 1.1 mmol/L (ref 0.5–2.2)

## 2014-03-06 LAB — BLOOD GAS, ARTERIAL
ACID-BASE DEFICIT: 4 mmol/L — AB (ref 0.0–2.0)
BICARBONATE: 20.7 meq/L (ref 20.0–24.0)
Drawn by: 369891
O2 Content: 5 L/min
O2 Saturation: 83.6 %
PCO2 ART: 38.9 mmHg (ref 35.0–45.0)
PO2 ART: 50.8 mmHg — AB (ref 80.0–100.0)
Patient temperature: 98.6
TCO2: 21.9 mmol/L (ref 0–100)
pH, Arterial: 7.345 — ABNORMAL LOW (ref 7.350–7.450)

## 2014-03-06 LAB — TROPONIN I
TROPONIN I: 5.5 ng/mL — AB (ref <0.30)
TROPONIN I: 6.05 ng/mL — AB (ref <0.30)
Troponin I: 10.57 ng/mL (ref <0.30)
Troponin I: 16.99 ng/mL (ref <0.30)

## 2014-03-06 LAB — COMPREHENSIVE METABOLIC PANEL
ALBUMIN: 2.4 g/dL — AB (ref 3.5–5.2)
ALT: 15 U/L (ref 0–35)
AST: 41 U/L — ABNORMAL HIGH (ref 0–37)
Alkaline Phosphatase: 64 U/L (ref 39–117)
Anion gap: 14 (ref 5–15)
BILIRUBIN TOTAL: 0.6 mg/dL (ref 0.3–1.2)
BUN: 10 mg/dL (ref 6–23)
CHLORIDE: 99 meq/L (ref 96–112)
CO2: 19 mEq/L (ref 19–32)
Calcium: 7.2 mg/dL — ABNORMAL LOW (ref 8.4–10.5)
Creatinine, Ser: 0.47 mg/dL — ABNORMAL LOW (ref 0.50–1.10)
GFR calc Af Amer: >90 mL/min (ref >90)
GFR calc non Af Amer: >90 mL/min (ref >90)
Glucose, Bld: 175 mg/dL — ABNORMAL HIGH (ref 70–99)
POTASSIUM: 3.4 meq/L — AB (ref 3.7–5.3)
SODIUM: 132 meq/L — AB (ref 137–147)
TOTAL PROTEIN: 5.5 g/dL — AB (ref 6.0–8.3)

## 2014-03-06 LAB — CBC WITH DIFFERENTIAL/PLATELET
BASOS ABS: 0 10*3/uL (ref 0.0–0.1)
BASOS PCT: 0 % (ref 0–1)
EOS PCT: 0 % (ref 0–5)
Eosinophils Absolute: 0 10*3/uL (ref 0.0–0.7)
HEMATOCRIT: 26.7 % — AB (ref 36.0–46.0)
HEMOGLOBIN: 8.3 g/dL — AB (ref 12.0–15.0)
Lymphocytes Relative: 5 % — ABNORMAL LOW (ref 12–46)
Lymphs Abs: 0.5 10*3/uL — ABNORMAL LOW (ref 0.7–4.0)
MCH: 21.9 pg — ABNORMAL LOW (ref 26.0–34.0)
MCHC: 31.1 g/dL (ref 30.0–36.0)
MCV: 70.4 fL — AB (ref 78.0–100.0)
MONOS PCT: 7 % (ref 3–12)
Monocytes Absolute: 0.6 10*3/uL (ref 0.1–1.0)
Neutro Abs: 7.8 10*3/uL — ABNORMAL HIGH (ref 1.7–7.7)
Neutrophils Relative %: 88 % — ABNORMAL HIGH (ref 43–77)
Platelets: 202 10*3/uL (ref 150–400)
RBC: 3.79 MIL/uL — ABNORMAL LOW (ref 3.87–5.11)
RDW: 18.5 % — AB (ref 11.5–15.5)
WBC: 8.9 10*3/uL (ref 4.0–10.5)

## 2014-03-06 LAB — CBC
HCT: 27.9 % — ABNORMAL LOW (ref 36.0–46.0)
HCT: 28.5 % — ABNORMAL LOW (ref 36.0–46.0)
HEMATOCRIT: 26.7 % — AB (ref 36.0–46.0)
HEMOGLOBIN: 8.3 g/dL — AB (ref 12.0–15.0)
Hemoglobin: 8.6 g/dL — ABNORMAL LOW (ref 12.0–15.0)
Hemoglobin: 8.9 g/dL — ABNORMAL LOW (ref 12.0–15.0)
MCH: 22.1 pg — ABNORMAL LOW (ref 26.0–34.0)
MCH: 22.1 pg — AB (ref 26.0–34.0)
MCH: 22.4 pg — ABNORMAL LOW (ref 26.0–34.0)
MCHC: 31.1 g/dL (ref 30.0–36.0)
MCHC: 30.8 g/dL (ref 30.0–36.0)
MCHC: 31.2 g/dL (ref 30.0–36.0)
MCV: 71 fL — ABNORMAL LOW (ref 78.0–100.0)
MCV: 71.7 fL — ABNORMAL LOW (ref 78.0–100.0)
MCV: 71.8 fL — AB (ref 78.0–100.0)
Platelets: 191 10*3/uL (ref 150–400)
Platelets: 194 10*3/uL (ref 150–400)
Platelets: 180 10*3/uL (ref 150–400)
RBC: 3.76 MIL/uL — AB (ref 3.87–5.11)
RBC: 3.89 MIL/uL (ref 3.87–5.11)
RBC: 3.97 MIL/uL (ref 3.87–5.11)
RDW: 18.5 % — ABNORMAL HIGH (ref 11.5–15.5)
RDW: 18.5 % — AB (ref 11.5–15.5)
RDW: 18.4 % — AB (ref 11.5–15.5)
WBC: 7.3 10*3/uL (ref 4.0–10.5)
WBC: 7.8 10*3/uL (ref 4.0–10.5)
WBC: 7.8 10*3/uL (ref 4.0–10.5)

## 2014-03-06 LAB — INFLUENZA PANEL BY PCR (TYPE A & B)
H1N1FLUPCR: NOT DETECTED
INFLBPCR: NEGATIVE
Influenza A By PCR: NEGATIVE

## 2014-03-06 LAB — STREP PNEUMONIAE URINARY ANTIGEN: Strep Pneumo Urinary Antigen: NEGATIVE

## 2014-03-06 LAB — CARBOXYHEMOGLOBIN
CARBOXYHEMOGLOBIN: 1.7 % — AB (ref 0.5–1.5)
Methemoglobin: 0.4 % (ref 0.0–1.5)
O2 Saturation: 68.9 %
TOTAL HEMOGLOBIN: 9.1 g/dL — AB (ref 12.0–16.0)

## 2014-03-06 LAB — PROCALCITONIN
PROCALCITONIN: 1.59 ng/mL
Procalcitonin: 0.62 ng/mL

## 2014-03-06 LAB — GLUCOSE, CAPILLARY
GLUCOSE-CAPILLARY: 221 mg/dL — AB (ref 70–99)
Glucose-Capillary: 181 mg/dL — ABNORMAL HIGH (ref 70–99)
Glucose-Capillary: 174 mg/dL — ABNORMAL HIGH (ref 70–99)
Glucose-Capillary: 224 mg/dL — ABNORMAL HIGH (ref 70–99)

## 2014-03-06 LAB — D-DIMER, QUANTITATIVE (NOT AT ARMC): D DIMER QUANT: 3.31 ug{FEU}/mL — AB (ref 0.00–0.48)

## 2014-03-06 LAB — HEPARIN LEVEL (UNFRACTIONATED): Heparin Unfractionated: <0.1 IU/mL — ABNORMAL LOW (ref 0.30–0.70)

## 2014-03-06 LAB — CK: Total CK: 806 U/L — ABNORMAL HIGH (ref 7–177)

## 2014-03-06 LAB — PRO B NATRIURETIC PEPTIDE: Pro B Natriuretic peptide (BNP): 8842 pg/mL — ABNORMAL HIGH (ref 0–125)

## 2014-03-06 LAB — MRSA PCR SCREENING: MRSA BY PCR: NEGATIVE

## 2014-03-06 LAB — SEDIMENTATION RATE: Sed Rate: 40 mm/hr — ABNORMAL HIGH (ref 0–22)

## 2014-03-06 LAB — PROTIME-INR
INR: 1.45 (ref 0.00–1.49)
PROTHROMBIN TIME: 17.6 s — AB (ref 11.6–15.2)

## 2014-03-06 LAB — URIC ACID: Uric Acid, Serum: 7.2 mg/dL — ABNORMAL HIGH (ref 2.4–7.0)

## 2014-03-06 MED ORDER — ATORVASTATIN CALCIUM 40 MG PO TABS
40.0000 mg | ORAL_TABLET | Freq: Every day | ORAL | Status: DC
  Administered 2014-03-06 – 2014-03-14 (×7): 40 mg
  Filled 2014-03-06 (×11): qty 1

## 2014-03-06 MED ORDER — CHLORHEXIDINE GLUCONATE 0.12 % MT SOLN
15.0000 mL | Freq: Two times a day (BID) | OROMUCOSAL | Status: DC
  Administered 2014-03-06 – 2014-03-17 (×23): 15 mL via OROMUCOSAL
  Filled 2014-03-06 (×19): qty 15

## 2014-03-06 MED ORDER — LEVALBUTEROL HCL 0.63 MG/3ML IN NEBU
0.6300 mg | INHALATION_SOLUTION | Freq: Four times a day (QID) | RESPIRATORY_TRACT | Status: DC
  Administered 2014-03-06: 0.63 mg via RESPIRATORY_TRACT
  Filled 2014-03-06: qty 3

## 2014-03-06 MED ORDER — FAMOTIDINE IN NACL 20-0.9 MG/50ML-% IV SOLN
20.0000 mg | Freq: Two times a day (BID) | INTRAVENOUS | Status: DC
  Administered 2014-03-06 – 2014-03-16 (×20): 20 mg via INTRAVENOUS
  Filled 2014-03-06 (×22): qty 50

## 2014-03-06 MED ORDER — ASPIRIN EC 81 MG PO TBEC
81.0000 mg | DELAYED_RELEASE_TABLET | Freq: Every day | ORAL | Status: DC
  Filled 2014-03-06 (×2): qty 1

## 2014-03-06 MED ORDER — METHYLPREDNISOLONE SODIUM SUCC 125 MG IJ SOLR
60.0000 mg | Freq: Two times a day (BID) | INTRAMUSCULAR | Status: DC
  Administered 2014-03-06 (×2): 60 mg via INTRAVENOUS
  Filled 2014-03-06 (×4): qty 0.96

## 2014-03-06 MED ORDER — PERFLUTREN LIPID MICROSPHERE
1.0000 mL | INTRAVENOUS | Status: AC | PRN
  Filled 2014-03-06: qty 10

## 2014-03-06 MED ORDER — CETYLPYRIDINIUM CHLORIDE 0.05 % MT LIQD: 7.0000 mL | Freq: Two times a day (BID) | OROMUCOSAL | Status: DC

## 2014-03-06 MED ORDER — HEPARIN (PORCINE) IN NACL 100-0.45 UNIT/ML-% IJ SOLN
1000.0000 [IU]/h | INTRAMUSCULAR | Status: DC
  Administered 2014-03-06: 1000 [IU]/h via INTRAVENOUS
  Filled 2014-03-06 (×2): qty 250

## 2014-03-06 MED ORDER — HEPARIN BOLUS VIA INFUSION
4000.0000 [IU] | Freq: Once | INTRAVENOUS | Status: AC
  Administered 2014-03-06: 4000 [IU] via INTRAVENOUS
  Filled 2014-03-06: qty 4000

## 2014-03-06 MED ORDER — IPRATROPIUM-ALBUTEROL 0.5-2.5 (3) MG/3ML IN SOLN
3.0000 mL | Freq: Four times a day (QID) | RESPIRATORY_TRACT | Status: DC
  Administered 2014-03-06 – 2014-03-21 (×58): 3 mL via RESPIRATORY_TRACT
  Filled 2014-03-06 (×43): qty 3
  Filled 2014-03-06: qty 6
  Filled 2014-03-06 (×14): qty 3

## 2014-03-06 MED ORDER — HEPARIN BOLUS VIA INFUSION
2500.0000 [IU] | Freq: Once | INTRAVENOUS | Status: AC
  Administered 2014-03-06: 2500 [IU] via INTRAVENOUS
  Filled 2014-03-06: qty 2500

## 2014-03-06 MED ORDER — LEVALBUTEROL HCL 0.63 MG/3ML IN NEBU
0.6300 mg | INHALATION_SOLUTION | Freq: Four times a day (QID) | RESPIRATORY_TRACT | Status: DC | PRN
  Administered 2014-03-06: 0.63 mg via RESPIRATORY_TRACT
  Filled 2014-03-06: qty 3

## 2014-03-06 MED ORDER — ASPIRIN EC 325 MG PO TBEC
325.0000 mg | DELAYED_RELEASE_TABLET | Freq: Once | ORAL | Status: AC
  Administered 2014-03-06: 325 mg via ORAL
  Filled 2014-03-06: qty 1

## 2014-03-06 MED ORDER — FENTANYL BOLUS VIA INFUSION
50.0000 ug | INTRAVENOUS | Status: DC | PRN
  Administered 2014-03-09: 100 ug via INTRAVENOUS
  Filled 2014-03-06: qty 100

## 2014-03-06 MED ORDER — ASPIRIN 81 MG PO CHEW
81.0000 mg | CHEWABLE_TABLET | Freq: Every day | ORAL | Status: DC
  Administered 2014-03-06 – 2014-03-10 (×5): 81 mg
  Filled 2014-03-06 (×4): qty 1

## 2014-03-06 MED ORDER — ATORVASTATIN CALCIUM 40 MG PO TABS
40.0000 mg | ORAL_TABLET | Freq: Every day | ORAL | Status: DC
  Filled 2014-03-06: qty 1

## 2014-03-06 MED ORDER — SODIUM CHLORIDE 0.9 % IV BOLUS (SEPSIS)
1000.0000 mL | Freq: Once | INTRAVENOUS | Status: AC
  Administered 2014-03-06: 1000 mL via INTRAVENOUS

## 2014-03-06 MED ORDER — FENTANYL CITRATE 0.05 MG/ML IJ SOLN
50.0000 ug | Freq: Once | INTRAMUSCULAR | Status: AC
  Administered 2014-03-06: 50 ug via INTRAVENOUS

## 2014-03-06 MED ORDER — SODIUM CHLORIDE 0.9 % IV BOLUS (SEPSIS)
500.0000 mL | Freq: Once | INTRAVENOUS | Status: AC
  Administered 2014-03-06: 500 mL via INTRAVENOUS

## 2014-03-06 MED ORDER — ESCITALOPRAM OXALATE 10 MG PO TABS
10.0000 mg | ORAL_TABLET | Freq: Every day | ORAL | Status: DC
  Administered 2014-03-06 – 2014-03-15 (×9): 10 mg
  Filled 2014-03-06 (×11): qty 1

## 2014-03-06 MED ORDER — ETOMIDATE 2 MG/ML IV SOLN
0.3000 mg/kg | Freq: Once | INTRAVENOUS | Status: AC
  Administered 2014-03-06: 29.08 mg via INTRAVENOUS
  Filled 2014-03-06: qty 14.54

## 2014-03-06 MED ORDER — CETYLPYRIDINIUM CHLORIDE 0.05 % MT LIQD
7.0000 mL | Freq: Four times a day (QID) | OROMUCOSAL | Status: DC
  Administered 2014-03-06 – 2014-03-17 (×46): 7 mL via OROMUCOSAL

## 2014-03-06 MED ORDER — HEPARIN (PORCINE) IN NACL 100-0.45 UNIT/ML-% IJ SOLN
1750.0000 [IU]/h | INTRAMUSCULAR | Status: DC
  Administered 2014-03-06 (×2): 1300 [IU]/h via INTRAVENOUS
  Administered 2014-03-07: 1600 [IU]/h via INTRAVENOUS
  Administered 2014-03-07 – 2014-03-08 (×2): 1750 [IU]/h via INTRAVENOUS
  Filled 2014-03-06 (×4): qty 250

## 2014-03-06 MED ORDER — PHENYLEPHRINE HCL 10 MG/ML IJ SOLN
30.0000 ug/min | INTRAVENOUS | Status: DC
  Administered 2014-03-06: 30 ug/min via INTRAVENOUS
  Administered 2014-03-07: 45 ug/min via INTRAVENOUS
  Filled 2014-03-06 (×2): qty 1

## 2014-03-06 MED ORDER — MIDAZOLAM HCL 2 MG/2ML IJ SOLN
INTRAMUSCULAR | Status: AC
  Administered 2014-03-06: 2 mg
  Filled 2014-03-06: qty 4

## 2014-03-06 MED ORDER — ROCURONIUM BROMIDE 50 MG/5ML IV SOLN
1.0000 mg/kg | Freq: Once | INTRAVENOUS | Status: AC
  Administered 2014-03-06: 96.9 mg via INTRAVENOUS
  Filled 2014-03-06: qty 9.69

## 2014-03-06 MED ORDER — FENTANYL CITRATE 0.05 MG/ML IJ SOLN
0.0000 ug/h | INTRAMUSCULAR | Status: DC
  Administered 2014-03-06 – 2014-03-07 (×2): 100 ug/h via INTRAVENOUS
  Administered 2014-03-07 – 2014-03-08 (×2): 200 ug/h via INTRAVENOUS
  Administered 2014-03-10 (×2): 300 ug/h via INTRAVENOUS
  Filled 2014-03-06 (×10): qty 50

## 2014-03-06 NOTE — Progress Notes (Signed)
Pt tx 2M11 per MD order, pt with labored bx, on venti mask, pt cont to be A&O, obs to be resting with eyes closed, report called to receiving RN, updated report given at [facility], foley placed per MD order; 2 witness in room, sterile tech followed, pericare done prior to insertion

## 2014-03-06 NOTE — Progress Notes (Addendum)
Reviewed consult note from earlier this morning.  EF 30-35% has appearance of Takotsubo cardiomyopathy or LAD disease.   She progressed to acute respiratory failure earlier today. Intubated. Fever. Leg hematoma, wound. Severe PVD.   Leg hematoma on CT.   Denied CP. ? If +Trop from demand ischemia in setting of recent SIRS/ as well as stress induced cardiomyopathy.   Ultimately, cardiac cath to define anatomy would be indicated but currently she would need to improve from her current respiratory failure, infection, etc.    If H/H drops with heparin or there is indication of worsening hematoma of LE, heparin may need to be stopped.    ?respiratory failure a response to pulmonary edema in the setting of cardiomyopathy. May ultimately need diuresis.   Right heart cath may be helpful to delineate.    Donato Schultz, MD

## 2014-03-06 NOTE — Progress Notes (Signed)
Patient sustained ST 130s, respiratory effort increased, oxygen increased to 6L [place] with decreased o2 Sats. Lung sounds inspiratory/expiratory wheezing and rhonchi in all fields. Notified T. Claiborne Billings, NP, new orders received for nebs. RT notified. Will continue to monitor.

## 2014-03-06 NOTE — Procedures (Signed)
Central Venous Catheter Insertion Procedure Note BRINLYN CENA [id number] [birth year]  Procedure: Insertion of Central Venous Catheter Indications: Assessment of intravascular volume and Drug and/or fluid administration  Procedure Details Consent: Unable to obtain consent because of emergent medical necessity. Time Out: Verified patient identification, verified procedure, site/side was marked, verified correct patient position, special equipment/implants available, medications/allergies/relevent history reviewed, required imaging and test results available.  Performed  Maximum sterile technique was used including antiseptics, cap, gloves, gown, hand hygiene, mask and sheet. Skin prep: Chlorhexidine; local anesthetic administered A antimicrobial bonded/coated triple lumen catheter was placed in the left internal jugular vein using the Seldinger technique.  Evaluation Blood flow good Complications: No apparent complications Patient did tolerate procedure well. Chest X-ray ordered to verify placement.  CXR: pending.  Performed under direct MD supervision.  Performed using ultrasound guidance.  Wire visualized in vessel under ultrasound.   Dirk Dress, NP 03/06/2014  11:05 AM   Attending:   I supervised the procedure  Heber Baxter, MD [place] [facility] Pager: [phone] Cell: [phone] If no response, call [phone]

## 2014-03-06 NOTE — Progress Notes (Signed)
ANTICOAGULATION CONSULT NOTE - Initial Consult  Pharmacy Consult for heparin Indication: chest pain/ACS  Allergies  Allergen Reactions  . Penicillins Hives    Tolerates cefepime  . Adhesive [Tape] Rash    Patient Measurements: Height:  (157.5 cm) Weight: 213 lb 10 oz (96.9 kg) IBW/kg (Calculated) : 50.1 Heparin Dosing Weight: 72kg  Vital Signs: Temp: 99.2 F (37.3 C) (09/14 1541) Temp src: Oral (09/14 1541) BP: 88/58 mmHg (09/14 1845) Pulse Rate: 100 (09/14 1845)  Labs:  Recent Labs  03/05/14 1225 03/05/14 1650  03/06/14 0415 03/06/14 0555 03/06/14 0800 03/06/14 1015 03/06/14 1300 03/06/14 1718 03/06/14 1800 03/06/14 1830  HGB 13.9  --   --  8.3* 8.3*  --   --  8.6*  --  8.9*  --   HCT 41.0  --   --  26.7* 26.7*  --   --  27.9*  --  28.5*  --   PLT  --   --   --  202 191  --   --  194  --  180  --   LABPROT  --   --   --   --   --   --   --  17.6*  --   --   --   INR  --   --   --   --   --   --   --  1.45  --   --   --   HEPARINUNFRC  --   --   --   --   --  <0.10*  --   --   --   --  <0.10*  CREATININE 0.80 0.60  --  0.47*  --   --   --   --   --   --   --   CKTOTAL  --   --   --   --   --  806*  --   --   --   --   --   TROPONINI  --   --   < > 6.05*  --   --  10.57*  --  16.99*  --   --   < > = values in this interval not displayed.  Estimated Creatinine Clearance: 85.3 ml/min (by C-G formula based on Cr of 0.47).   Medical History: Past Medical History  Diagnosis Date  . Diabetes mellitus   . Peripheral vascular disease   . Hyperlipidemia   . Obesity   . Hypertension   . Arthritis   . Ulcer of ankle     right  . Coronary artery disease   . COPD (chronic obstructive pulmonary disease)     Medications:  Prescriptions prior to admission  Medication Sig Dispense Refill  . alprazolam (XANAX) 2 MG tablet Take 1 mg by mouth every 6 (six) hours as needed for sleep or anxiety.       Marland Kitchen aspirin EC 325 MG tablet Take 325 mg by mouth daily.      Marland Kitchen  atorvastatin (LIPITOR) 20 MG tablet Take 20 mg by mouth daily.      Marland Kitchen escitalopram (LEXAPRO) 10 MG tablet Take 10 mg by mouth daily.      . fentaNYL (DURAGESIC - DOSED MCG/HR) 100 MCG/HR Place 100 mcg onto the skin every 3 (three) days.      . fluticasone (FLONASE) 50 MCG/ACT nasal spray Place 1 spray into both nostrils daily as needed for allergies.       Marland Kitchen  gabapentin (NEURONTIN) 600 MG tablet Take 600 mg by mouth 3 (three) times daily.      Marland Kitchen glimepiride (AMARYL) 2 MG tablet Take 2 mg by mouth 2 (two) times daily.      . hydrochlorothiazide (MICROZIDE) 12.5 MG capsule Take 12.5 mg by mouth daily.      . meloxicam (MOBIC) 15 MG tablet Take 15 mg by mouth daily.      . metFORMIN (GLUCOPHAGE) 500 MG tablet Take 1,000 mg by mouth 2 (two) times daily with a meal.      . oxycodone (ROXICODONE) 30 MG immediate release tablet Take 30 mg by mouth every 4 (four) hours as needed for pain.       Marland Kitchen quinapril (ACCUPRIL) 20 MG tablet Take 20 mg by mouth daily.      . VESICARE 5 MG tablet Take 5 mg by mouth daily.        Scheduled:  . antiseptic oral rinse  7 mL Mouth Rinse QID  . aspirin  81 mg Per Tube Daily  . atorvastatin  40 mg Per Tube q1800  . ceFEPime (MAXIPIME) IV  1 g Intravenous Q8H  . chlorhexidine  15 mL Mouth Rinse BID  . darifenacin  7.5 mg Oral Daily  . escitalopram  10 mg Per Tube Daily  . famotidine (PEPCID) IV  20 mg Intravenous Q12H  . gabapentin  600 mg Oral TID  . insulin aspart  0-9 Units Subcutaneous TID WC  . ipratropium-albuterol  3 mL Nebulization Q6H  . methylPREDNISolone (SOLU-MEDROL) injection  60 mg Intravenous Q12H  . vancomycin  500 mg Intravenous Once  . vancomycin  1,000 mg Intravenous Q8H   Infusions:  . sodium chloride 100 mL/hr at 03/06/14 0801  . fentaNYL infusion INTRAVENOUS 200 mcg/hr (03/06/14 1500)  . heparin 1,000 Units/hr (03/06/14 1200)    Assessment: 57yo female c/o leg pain despite vascular surgery, determined to be patent on 8/17, doppler reveals  significant plaque, admitted currently for concern for sepsis given fever, now w/ elevated troponin.  PM  Heparin level came back low. Will adjust rate and recheck level in AM.  Goal of Therapy:  Heparin level 0.3-0.7 units/ml Monitor platelets by anticoagulation protocol: Yes   Plan:   Heparin bolus 2500 units x1 Increase heparin to 1300 units/hr Level in AM  Ulyses Southward, PharmD Pager: [phone] 03/06/2014 7:21 PM

## 2014-03-06 NOTE — Procedures (Signed)
Intubation Procedure Note [name] [id number] Oct 07, 1955  Procedure: Intubation Indications: Airway protection and maintenance  Procedure Details Consent: Unable to obtain consent because of emergent medical necessity. Time Out: Verified patient identification, verified procedure, site/side was marked, verified correct patient position, special equipment/implants available, medications/allergies/relevent history reviewed, required imaging and test results available.  Performed  Drugs Etomidate , Rocuronium  DL x 1 with MAC 3 blade Grade 1 view 7.5 ET tube passed through cords under direct visualization Placement confirmed with bilateral breath sounds, positive EtCO2 change and smoke in tube   Evaluation Hemodynamic Status: BP stable throughout; O2 sats: stable throughout Patient's Current Condition: stable Complications: No apparent complications Patient did tolerate procedure well. Chest X-ray ordered to verify placement.  CXR: pending.   [name] 03/06/2014

## 2014-03-06 NOTE — Progress Notes (Signed)
Patient ID: [name], female   DOB: Jan 07, 1956, 58 y.o.   MRN: [id number] We were notified of the patient's admission and critical condition. Recently underwent arteriogram with Dr. [name] on 03/01/2014 for evaluation of femoral to tibioperoneal trunk bypass. She was occluded with worsening pulmonary status is now intubated. Unresponsive on the vent currently.  Of lower from his record bilaterally pedal adequately perfused. Does have superficial ulcerations of the toes and the ankles of both feet with no evidence of sepsis related to this. Will notify Dr. [name] or Will follow along.

## 2014-03-06 NOTE — Progress Notes (Signed)
ANTICOAGULATION CONSULT NOTE - Initial Consult  Pharmacy Consult for heparin Indication: chest pain/ACS  Allergies  Allergen Reactions  . Penicillins Hives    Tolerates cefepime  . Adhesive [Tape] Rash    Patient Measurements: Height:  (157.5 cm) Weight: 206 lb 9.1 oz (93.7 kg) IBW/kg (Calculated) : 50.1 Heparin Dosing Weight: 72kg  Vital Signs: Temp: 101.2 F (38.4 C) (09/13 2323) Temp src: Rectal (09/13 2323) BP: 95/58 mmHg (09/14 0000) Pulse Rate: 110 (09/14 0000)  Labs:  Recent Labs  03/05/14 1210 03/05/14 1225 03/05/14 1650 03/05/14 2308  HGB 11.5* 13.9  --   --   HCT 36.3 41.0  --   --   PLT 305  --   --   --   CREATININE  --  0.80 0.60  --   TROPONINI  --   --   --  5.50*    Estimated Creatinine Clearance: 83.7 ml/min (by C-G formula based on Cr of 0.6).   Medical History: Past Medical History  Diagnosis Date  . Diabetes mellitus   . Peripheral vascular disease   . Hyperlipidemia   . Obesity   . Hypertension   . Arthritis   . Ulcer of ankle     right  . Coronary artery disease   . COPD (chronic obstructive pulmonary disease)     Medications:  Prescriptions prior to admission  Medication Sig Dispense Refill  . alprazolam (XANAX) 2 MG tablet Take 1 mg by mouth every 6 (six) hours as needed for sleep or anxiety.       Marland Kitchen aspirin EC 325 MG tablet Take 325 mg by mouth daily.      Marland Kitchen atorvastatin (LIPITOR) 20 MG tablet Take 20 mg by mouth daily.      Marland Kitchen escitalopram (LEXAPRO) 10 MG tablet Take 10 mg by mouth daily.      . fentaNYL (DURAGESIC - DOSED MCG/HR) 100 MCG/HR Place 100 mcg onto the skin every 3 (three) days.      . fluticasone (FLONASE) 50 MCG/ACT nasal spray Place 1 spray into both nostrils daily as needed for allergies.       Marland Kitchen gabapentin (NEURONTIN) 600 MG tablet Take 600 mg by mouth 3 (three) times daily.      Marland Kitchen glimepiride (AMARYL) 2 MG tablet Take 2 mg by mouth 2 (two) times daily.      . hydrochlorothiazide (MICROZIDE) 12.5 MG  capsule Take 12.5 mg by mouth daily.      . meloxicam (MOBIC) 15 MG tablet Take 15 mg by mouth daily.      . metFORMIN (GLUCOPHAGE) 500 MG tablet Take 1,000 mg by mouth 2 (two) times daily with a meal.      . oxycodone (ROXICODONE) 30 MG immediate release tablet Take 30 mg by mouth every 4 (four) hours as needed for pain.       Marland Kitchen quinapril (ACCUPRIL) 20 MG tablet Take 20 mg by mouth daily.      . VESICARE 5 MG tablet Take 5 mg by mouth daily.        Scheduled:  . aspirin EC  325 mg Oral Daily  . atorvastatin  40 mg Oral Daily  . ceFEPime (MAXIPIME) IV  1 g Intravenous Q8H  . darifenacin  7.5 mg Oral Daily  . escitalopram  10 mg Oral Daily  . gabapentin  600 mg Oral TID  . glimepiride  2 mg Oral BID  . insulin aspart  0-9 Units Subcutaneous TID WC  .  sodium chloride  1,000 mL Intravenous Once  . vancomycin  500 mg Intravenous Once  . vancomycin  1,000 mg Intravenous Q8H   Infusions:  . sodium chloride 150 mL/hr at 03/06/14 0032    Assessment: 58yo female c/o leg pain despite vascular surgery, determined to be patent on 8/17, doppler reveals significant plaque, admitted currently for concern for sepsis given fever, now w/ elevated troponin, to begin heparin.  Goal of Therapy:  Heparin level 0.3-0.7 units/ml Monitor platelets by anticoagulation protocol: Yes   Plan:  Will give heparin 4000 units IV bolus x1 followed by gtt at 1000 units/hr and monitor heparin levels and CBC.  Vernard Gambles, PharmD, BCPS  03/06/2014,12:48 AM

## 2014-03-06 NOTE — Progress Notes (Signed)
Lightning rounds from [facility]ink 3pm shift   Hx noted Looks stable on camera exam  D.w bedside RN: no active bleeding, she is stable on vent,. Not on pressors. Dong ok on fent gtt - RN concerned fentanyl might not be enough in future   LABS , PULMONARY  Recent Labs Lab 03/01/14 0708 03/05/14 1225 03/06/14 0440 03/06/14 1008 03/06/14 1251 03/06/14 1524  PHART  --   --  7.345* 7.049* 7.174*  --   PCO2ART  --   --  38.9 83.7* 57.8*  --   PO2ART  --   --  50.8* 82.0 96.0  --   HCO3  --   --  20.7 22.9 21.1  --   TCO2 26 26 21.9 25 23   --   O2SAT  --   --  83.6 88.0 94.0 68.9    CBC  Recent Labs Lab 03/06/14 0415 03/06/14 0555 03/06/14 1300  HGB 8.3* 8.3* 8.6*  HCT 26.7* 26.7* 27.9*  WBC 8.9 7.3 7.8  PLT 202 191 194    COAGULATION  Recent Labs Lab 03/06/14 1300  INR 1.45    CARDIAC   Recent Labs Lab 03/05/14 2308 03/06/14 0415 03/06/14 1015  TROPONINI 5.50* 6.05* 10.57*    Recent Labs Lab 03/06/14 1015  PROBNP 8842.0*     CHEMISTRY  Recent Labs Lab 03/01/14 0708 03/05/14 1225 03/05/14 1650 03/06/14 0415  NA 137 130* 130* 132*  K 3.6* 3.7 3.8 3.4*  CL 101 93* 92* 99  CO2  --   --  25 19  GLUCOSE 155* 201* 175* 175*  BUN CREATININE 0.60 0.80 0.60 0.47*  CALCIUM  --   --  8.8 7.2*   Estimated Creatinine Clearance: 85.3 ml/min (by C-G formula based on Cr of 0.47).   LIVER  Recent Labs Lab 03/05/14 1650 03/06/14 0415 03/06/14 1300  AST 31 41*  --   ALT 11 15  --   ALKPHOS 78 64  --   BILITOT 0.7 0.6  --   PROT 6.6 5.5*  --   ALBUMIN 3.0* 2.4*  --   INR  --   --  1.45     INFECTIOUS  Recent Labs Lab 03/05/14 2308 03/06/14 0415 03/06/14 0730 03/06/14 1400  LATICACIDVEN 1.5 0.8  --  1.1  PROCALCITON  --   --  0.62  --      ENDOCRINE CBG (last 3)   Recent Labs  03/05/14 2221 03/06/14 0736 03/06/14 1108  GLUCAP 184* 181* 221*         IMAGING x48h Dg Tibia/fibula Left  03/05/2014   CLINICAL  DATA:  Lower leg pain.  EXAM: LEFT TIBIA AND FIBULA - 2 VIEW  COMPARISON:  Screws in the distal tibia and side plate and screws in the distal fibula. The fractures have healed. 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 No acute osseous abnormality. Extensive vascular calcifications and dermal calcifications in the lower leg.  FINDINGS: No acute abnormalities. Arthritis at the knee and ankle. Postsurgical changes.  IMPRESSION: Negative.   Electronically Signed   By: Geanie Cooley M.D.   On: 03/05/2014 22:10   Dg Chest Portable 1 View  03/05/2014   CLINICAL DATA:  Leg pain.  Smoker.  EXAM: PORTABLE CHEST - 1 VIEW  COMPARISON:  Chest radiograph 09/25/2013  FINDINGS: Cardiac leads project over the chest. Cardiomediastinal silhouette is stable. There is  atherosclerotic calcification in the thoracic aortic arch. Lung volumes are low resulting in crowding of pulmonary vascular markings. No airspace disease or pulmonary edema is identified. No acute bony abnormality appreciated.  IMPRESSION: Low lung volumes.  No acute findings identified.  Atherosclerosis of the thoracic aorta.   Electronically Signed   By: Britta Mccreedy M.D.   On: 03/05/2014 19:15   Dg Foot Complete Left  03/05/2014   CLINICAL DATA:  Left foot pain. Soft tissue ulceration. Redness and discoloration.  EXAM: LEFT FOOT - COMPLETE 3+ VIEW  COMPARISON:  08/10/2012  FINDINGS: There is no fracture or dislocation or bone destruction suggestive of osteomyelitis. There are 2 screws in the distal tibia and a side plate and multiple screws in the distal fibula. 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 Small plantar calcaneal spur.  IMPRESSION: No acute abnormalities.  No change since the prior exam.   Electronically Signed   By: Geanie Cooley M.D.   On: 03/05/2014 22:09     A resp acidosis - vdrf No active bleeding Other labs ok   Plan Stat abg  Dr.  Kalman Shan, M.D., [facility].C.P Pulmonary and Critical Care Medicine Staff Physician [facility]  Pulmonary and Critical Care Pager: [phone], If no answer or between  15:00h - 7:00h: call [phone]  03/06/2014 4:01 PM

## 2014-03-06 NOTE — Progress Notes (Signed)
Pt family updated about tx sister Earles

## 2014-03-06 NOTE — Progress Notes (Signed)
[facility]  Called back to assess patient for worsening hypoxemic respiratory failure  On exam: Filed Vitals:   03/06/14 1007  BP:   Pulse: 142  Temp:   Resp: 24  92% O2 sat on NRB mask  Agonal respirations Coarse breath sounds Encephalopathic  Impression: 1) Acute hypoxemic respiratory failure > HCAP? Pulm edema? Related to COPD (seems less likely)? 2) NSTEMI 3) Sepsis 4) Acute encephalopathy due to hypercarbia 5) severe peripheral vascular disease  Plan: 1) emergent intubation 2) Full vent support 3) fentanyl gtt titrated to RASS -2 4) check pro-BNP 5) f/u echo 6) f/u cardiology recs 7) continue heparin gtt 8) Stat CBC 9) notify vascular surgery of event this morning  Additional cc time 40 minutes  Heber Wallace, MD [place] [facility] Pager: [phone] Cell: [phone] If no response, call [phone]

## 2014-03-06 NOTE — Progress Notes (Signed)
Patient transferred from ER via stretcher on tele, no family at bedside. Patient oriented to unit and room, instructed on callbell and placed at side. Will continue to monitor.

## 2014-03-06 NOTE — Consult Note (Signed)
WOC wound consult note Reason for Consult: Consult requested for right groin wound.  Pt had a cath on 9/9 according to EMR.   Wound type: Partial thickness wound over previous puncture site. Measurement: .5X.5X.1cm Wound bed: pink and moist in folds to groin/abd over site location Drainage (amount, consistency, odor) small amt yellow drainage, no odor Periwound: bruising surrounding Dressing procedure/placement/frequency: Foam dressing to protect and absorb drainage. Please re-consult if further assistance is needed.  Thank-you,  Cammie Mcgee MSN, RN, CWOCN, [place], CNS [phone]

## 2014-03-06 NOTE — Consult Note (Addendum)
CARDIOLOGY CONSULT NOTE  Patient ID: [name], MRN: [id number], DOB/AGE: [birth year] 58 y.o. Admit date: 03/05/2014 Date of Consult: 03/06/2014  Primary Physician: Kirk Ruths, MD Primary Cardiologist: saw Dr. Dickie La a long time ago  Chief Complaint: leg pain Reason for Consultation: positive troponin  HPI: 58 y.o. female w/ PMHx significant for CAD s/p RCA stent 2000, PVD s/p  R femoral bypass 09/2013, DM2, tobacco abuse presenting with leg pain and bruising in the left groin after vascular procedure on 9/10. Significant L subcutaneous hematoma involving the flank and pubic area. Seen by vascular surgery who felt that the limb was not threatened.   However, she was noted to be tachycardiac and relatively hypotensive prompting admission to medicine for further workup. Started on broad spectrum abx and underwent abdominal CT to investigate nonspecific abdominal pain.   During evaluation, had troponin drawn and returned elevated at 5 prompting cardiology consultation.  She tells me that she had a stent placed 15 years ago for symptoms of chest pain with exertion. She no longer follows with cardiology (Dr. Dickie La retired) and she does not have chest pain like before. Her exercise capacity is limited and she does get SOB with exertion. She continues to smoke. She also has abdominal discomfort that is associated with eating. No nausea, sweating, etc. She denies any recent fevers, new cough, or pain with urination.  Last cath in system is 08/2000 Patent RCA stent, nonobstructive dz.    Past Medical History  Diagnosis Date  . Diabetes mellitus   . Peripheral vascular disease   . Hyperlipidemia   . Obesity   . Hypertension   . Arthritis   . Ulcer of ankle     right  . Coronary artery disease   . COPD (chronic obstructive pulmonary disease)       Surgical History:  Past Surgical History  Procedure Laterality Date  . Cholecystectomy    . Femoral-tibial bypass graft Right  09/27/2013    Procedure: BYPASS GRAFT FEMORAL-TIBIAL ARTERY-RIGHT;  Surgeon: Nada Libman, MD;  Location: [facility] OR;  Service: Vascular;  Laterality: Right;  . Abdominal aorta stent Right   . Bypass graft popliteal to tibial Right   . Coronary angioplasty with stent placement      proximcal RCA stent ~ 2001  . Amputation Right 10/14/2013    Procedure: AMPUTATION RAY;  Surgeon: Nadara Mustard, MD;  Location: [facility] OR;  Service: Orthopedics;  Laterality: Right;  Right 2nd Ray Amputation, [place] Graft Right Ankle     Home Meds: Prior to Admission medications   Medication Sig Start Date End Date Taking? Authorizing Provider  alprazolam Prudy Feeler) 2 MG tablet Take 1 mg by mouth every 6 (six) hours as needed for sleep or anxiety.  04/06/12   Historical Provider, MD  aspirin EC 325 MG tablet Take 325 mg by mouth daily.    Historical Provider, MD  atorvastatin (LIPITOR) 20 MG tablet Take 20 mg by mouth daily. 03/08/12   Historical Provider, MD  escitalopram (LEXAPRO) 10 MG tablet Take 10 mg by mouth daily. 05/02/12   Historical Provider, MD  fentaNYL (DURAGESIC - DOSED MCG/HR) 100 MCG/HR Place 100 mcg onto the skin every 3 (three) days.    Historical Provider, MD  fluticasone (FLONASE) 50 MCG/ACT nasal spray Place 1 spray into both nostrils daily as needed for allergies.  07/21/13   Historical Provider, MD  gabapentin (NEURONTIN) 600 MG tablet Take 600 mg by mouth 3 (three) times daily.    Historical Provider,  MD  glimepiride (AMARYL) 2 MG tablet Take 2 mg by mouth 2 (two) times daily.    Historical Provider, MD  hydrochlorothiazide (MICROZIDE) 12.5 MG capsule Take 12.5 mg by mouth daily.    Historical Provider, MD  meloxicam (MOBIC) 15 MG tablet Take 15 mg by mouth daily.    Historical Provider, MD  metFORMIN (GLUCOPHAGE) 500 MG tablet Take 1,000 mg by mouth 2 (two) times daily with a meal.    Historical Provider, MD  oxycodone (ROXICODONE) 30 MG immediate release tablet Take 30 mg by mouth every 4 (four)  hours as needed for pain.     Historical Provider, MD  quinapril (ACCUPRIL) 20 MG tablet Take 20 mg by mouth daily.    Historical Provider, MD  VESICARE 5 MG tablet Take 5 mg by mouth daily.  11/20/13   Historical Provider, MD    Inpatient Medications:  . aspirin EC  325 mg Oral Daily  . atorvastatin  40 mg Oral Daily  . ceFEPime (MAXIPIME) IV  1 g Intravenous Q8H  . darifenacin  7.5 mg Oral Daily  . escitalopram  10 mg Oral Daily  . gabapentin  600 mg Oral TID  . glimepiride  2 mg Oral BID  . insulin aspart  0-9 Units Subcutaneous TID WC  . sodium chloride  1,000 mL Intravenous Once  . vancomycin  500 mg Intravenous Once  . vancomycin  1,000 mg Intravenous Q8H   . sodium chloride 150 mL/hr at 03/06/14 0032  . heparin 1,000 Units/hr (03/06/14 0122)    Allergies:  Allergies  Allergen Reactions  . Penicillins Hives    Tolerates cefepime  . Adhesive [Tape] Rash    History   Social History  . Marital Status: Widowed    Spouse Name: N/A    Number of Children: N/A  . Years of Education: N/A   Occupational History  . Not on file.   Social History Main Topics  . Smoking status: Heavy Tobacco Smoker -- 1.00 packs/day for 30 years    Types: Cigarettes  . Smokeless tobacco: Never Used     Comment: pt states that she is not quitting smoking and refused smoking cessation materials  . Alcohol Use: No  . Drug Use: No  . Sexual Activity: Not on file   Other Topics Concern  . Not on file   Social History Narrative  . No narrative on file     Family History  Problem Relation Age of Onset  . Cancer Mother   . Heart disease Father   . Hyperlipidemia Father   . Hypertension Father   . Other Father     varicose veins  . Heart attack Father   . Heart disease Sister   . Hyperlipidemia Sister   . Hypertension Sister   . Other Sister     varicose veins  . Heart attack Sister   . Heart disease Brother   . Hypertension Brother   . Hyperlipidemia Brother   . Other Brother      varicose veins  . Heart attack Brother      Review of Systems: General: negative for chills, fever, night sweats or weight changes.  Cardiovascular: see HPI Dermatological: negative for rash. +Bruising Respiratory: negative for cough or wheezing Urologic: negative for hematuria Abdominal: negative for nausea, vomiting, diarrhea, bright red blood per rectum, melena, or hematemesis Neurologic: negative for visual changes, syncope, or dizziness All other systems reviewed and are otherwise negative except as noted above.  Labs:  Recent Labs  03/05/14 2308  TROPONINI 5.50*   Lab Results  Component Value Date   WBC 17.1* 03/05/2014   HGB 13.9 03/05/2014   HCT 41.0 03/05/2014   MCV 70.5* 03/05/2014   PLT 305 03/05/2014    Recent Labs Lab 03/05/14 1650  NA 130*  K 3.8  CL 92*  CO2 25  BUN 13  CREATININE 0.60  CALCIUM 8.8  PROT 6.6  BILITOT 0.7  ALKPHOS 78  ALT 11  AST 31  GLUCOSE 175*   No results found for this basename: CHOL, HDL, LDLCALC, TRIG   No results found for this basename: DDIMER    Radiology/Studies:  Dg Tibia/fibula Left  03/05/2014   CLINICAL DATA:  Lower leg pain.  EXAM: LEFT TIBIA AND FIBULA - 2 VIEW  COMPARISON:  Screws in the distal tibia and side plate and screws in the distal fibula. The fractures have healed. 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 No acute osseous abnormality. Extensive vascular calcifications and dermal calcifications in the lower leg.  FINDINGS: No acute abnormalities. Arthritis at the knee and ankle. Postsurgical changes.  IMPRESSION: Negative.   Electronically Signed   By: Geanie Cooley M.D.   On: 03/05/2014 22:10   Ct Abdomen Pelvis W Contrast  03/06/2014   CLINICAL DATA:  Left flank pain began after recent vascular catheterization. Pain in the entire left leg. No fever, chills, nausea, vomiting.  EXAM: CT ABDOMEN AND PELVIS WITH CONTRAST   TECHNIQUE: Multidetector CT imaging of the abdomen and pelvis was performed using the standard protocol following bolus administration of intravenous contrast.  CONTRAST:  OMNIPAQUE IOHEXOL 300 MG/ML  SOLN  COMPARISON:  07/25/2013  FINDINGS: Lower chest: Dependent changes are seen in the lung bases. Scattered emphysematous changes are also present. There is atherosclerotic calcification of the coronary vessels. Heart size is normal. No pericardial effusion.  Upper abdomen: Scattered calcified granulomata are identified within the liver. No focal abnormality identified within the spleen, pancreas, or left adrenal gland. There is a low-attenuation lesion adjacent to or involving the right adrenal gland, consistent with a benign process. The patient has had previous cholecystectomy. Bilateral renal cysts are noted.  Bowel: Stomach has a normal appearance. A midline abdominal hernia contains mesenteric fat and nondilated loops of small bowel and appears stable over prior studies. Colonic loops are normal in appearance. There is moderate stool burden. The appendix has a normal appearance.  Pelvis: There is moderate stranding in the subcutaneous tissues and abdominal wall of the left inguinal region. Findings are consistent with edema/ hematoma after recent catheterization. Age no evidence for intraperitoneal hematoma. The uterus is present. No adnexal mass. The urinary bladder is distended.  Retroperitoneum: No retroperitoneal or mesenteric adenopathy. There is dense atherosclerotic calcification of the abdominal aorta. No aneurysm. Right iliac stent.  Osseous structures: Degenerative changes in the lower thoracic and upper lumbar spine. There is 4 mm of anterolisthesis of L3 on L4.  IMPRESSION: 1. Left inguinal hematoma. This does not extend into the peritoneal cavity. 2. Midline abdominal hernia contains mesenteric fat and nondilated loops of small bowel. 3. Distended urinary bladder. 4. Normal appendix. 5.  Scattered emphysematous changes. 6. Coronary artery calcifications. 7. Calcified granulomata in the liver. 8. Benign right adrenal gland lesion.   Electronically Signed   By: Rosalie Gums M.D.   On: 03/06/2014 01:07   Dg Chest Portable 1 View  03/05/2014  CLINICAL DATA:  Leg pain.  Smoker.  EXAM: PORTABLE CHEST - 1 VIEW  COMPARISON:  Chest radiograph 09/25/2013  FINDINGS: Cardiac leads project over the chest. Cardiomediastinal silhouette is stable. There is atherosclerotic calcification in the thoracic aortic arch. Lung volumes are low resulting in crowding of pulmonary vascular markings. No airspace disease or pulmonary edema is identified. No acute bony abnormality appreciated.  IMPRESSION: Low lung volumes.  No acute findings identified.  Atherosclerosis of the thoracic aorta.   Electronically Signed   By: Britta Mccreedy M.D.   On: 03/05/2014 19:15   Dg Foot Complete Left  03/05/2014   CLINICAL DATA:  Left foot pain. Soft tissue ulceration. Redness and discoloration.  EXAM: LEFT FOOT - COMPLETE 3+ VIEW  COMPARISON:  08/10/2012  FINDINGS: There is no fracture or dislocation or bone destruction suggestive of osteomyelitis. There are 2 screws in the distal tibia and a side plate and multiple screws in the distal fibula. 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 Small plantar calcaneal spur.  IMPRESSION: No acute abnormalities.  No change since the prior exam.   Electronically Signed   By: Geanie Cooley M.D.   On: 03/05/2014 22:09    EKG: sinus tachycardia, late R wave progress Telemetry shows sinus tach 100-120.  Physical Exam: Blood pressure 98/58, pulse 110, temperature 101.2 F (38.4 C), temperature source Rectal, resp. rate 33, height  (1.575 m), weight 93.7 kg (206 lb 9.1 oz), SpO2 98.00%. General: NAD, sleepy Head: Normocephalic, atraumatic, sclera non-icteric, nares are without discharge. Neck: Supple. Negative for carotid bruits. JVD not elevated. Lungs: coarse  rhonchi Heart: tachy, no m/r/g Abdomen: Soft, non-tender, non-distended with normoactive bowel sounds. No hepatomegaly. No rebound/guarding. Large pannus. Left flank bruising, pubic bruising Msk:  Strength and tone appear normal for age. Extremities: No clubbing or cyanosis. No edema.  Distal pedal pulses are 2+ and equal bilaterally. Neuro: Alert and oriented X 3. Moves all extremities spontaneously. Drowsy. Psych:  Responds to questions appropriately with a normal affect.   Assessment and Plan:  Problem List 1. Elevated troponin, without symptoms 2. Known CAD s/p RCA stent ~2000 3. PVD s/p R bypass 4. DM2 5. Hypotension and tachycardia, elevated WBC --> SIRS 6. Obesity 7. Left leg pain s/p recent vascular access 8. Microcytosis, ?iron deficient 9. Tobacco abuse  58 y.o. female w/ PMHx significant for CAD s/p RCA stent 2000, PVD s/p  R femoral bypass 09/2013, DM2, tobacco abuse presenting with leg pain and bruising in the left groin after vascular procedure on 9/10, now with hypotension, tachycardia, elevated WBC, found to have elevated troponin.  Though not the best historian, she does not appear to be having any chest pain or anginal equivalent. Differential includes atherosclerotic plaque rupture vs. troponin leak due to supply demand mismatch. Favor the later. However, at this time, treatment with aspirin, continue statin and heparinize (no RP bleed on CT scan). Echo ordered to evaluate for LV function.  Will likely need further risk stratification (cath vs. Imaging) but need to be more stable hemodynamically with sepsis improving. Recent groin stick difficulties to be noted if cath is needed.  Smoking cessation emphasized but pt does not appear to be contemplative at this point.  Recommendations: -continue statin, aspirin, heparin -echo (ordered)  Signed, Korie Streat C. MD 03/06/2014, 1:34 AM

## 2014-03-06 NOTE — Progress Notes (Signed)
ABG    Component Value Date/Time   PHART 7.345* 03/06/2014 0440   PCO2ART 38.9 03/06/2014 0440   PO2ART 50.8* 03/06/2014 0440   HCO3 20.7 03/06/2014 0440   TCO2 21.9 03/06/2014 0440   ACIDBASEDEF 4.0* 03/06/2014 0440   O2SAT 83.6 03/06/2014 0440   Switched pt from [place] to 55% VM.

## 2014-03-06 NOTE — Progress Notes (Addendum)
   Patient still hypotensive  Pertinent low bp labs    Recent Labs Lab 03/05/14 1225 03/06/14 0440 03/06/14 1008 03/06/14 1251 03/06/14 1524 03/06/14 1646  PHART  --  7.345* 7.049* 7.174*  --  7.300*  PCO2ART  --  38.9 83.7* 57.8*  --  41.5  PO2ART  --  50.8* 82.0 96.0  --  80.0  HCO3  --  20.7 22.9 21.1  --  20.3  TCO2 26 21.9 25 23   --  22  O2SAT  --  83.6 88.0 94.0 68.9 94.0      Recent Labs Lab 03/05/14 1225 03/06/14 0415 03/06/14 0555 03/06/14 1300 03/06/14 1800  HGB 13.9 8.3* 8.3* 8.6* 8.9*    Recent Labs Lab 03/05/14 2308 03/06/14 0415 03/06/14 0730 03/06/14 1400  LATICACIDVEN 1.5 0.8  --  1.1  PROCALCITON  --   --  0.62  --     Recent Labs Lab 03/01/14 0708 03/05/14 1225 03/05/14 1650 03/06/14 0415  CREATININE 0.60 0.80 0.60 0.47*    coox 68%  Plan Start neo Recheck pct   Dr. Kalman Shan, M.D., F.C.C.P Pulmonary and Critical Care Medicine Staff Physician [facility] [place] [facility] Pager: [phone], If no answer or between  15:00h - 7:00h: call [phone]  03/06/2014 7:38 PM

## 2014-03-06 NOTE — Progress Notes (Signed)
[facility] hospitalist progress note. Chief complaint. Tachypnea. History of present illness. This 58 year old female presented with leg pain status post recent catheterization procedure in the left leg. During workup the patient became hypotensive and tachycardic and was felt to be septic. She had an elevated troponin and cardiology has consulted the as well. He was noted by the nursing staff have a respiratory rate in the 40s with nursing indicating that the patient appeared to somewhat lethargic. Arterial blood gas was obtained to further evaluate gas exchange resulting pH 7.345, PCO2 39.9, PO2 low 50.8 and bicarbonate 20.7. I came up to bedside to further evaluate the patient. I found her alert and cooperative though she did appear somewhat somnolent. At this time her respiratory rate has returned to the 20s and her tachycardia has improved following fluid bolus administration and nebulizer treatments. Vital signs. Temperature 99.7, pulse 107, respiration 20, blood pressure 95/63. O2 sats 96% on 6 L nasal cannula oxygen. General appearance. Obese middle-aged female who is alert and in no distress. Cardiac. Rate and rhythm regular. Lungs. Rhonchi in the upper airways. Rales in the right base. Decreased sounds in the left base. No dyspnea or other indication of distress. Abdomen. Soft and obese with positive bowel sounds. No pain. Impression/plan. Problem #1. Hypoxia. No indication of CO2 retention to suggest CO2 narcosis. No indication of respiratory distress though the patient's PO2 is somewhat concerning. I requested nursing contact respiratory therapy and initiate Ventimask to better oxygenate this patient. Will continue with routine and as needed Xopenex nebulizers. Chest x-ray to admission found no acute abnormality. Will obtain a d-dimer level to evaluate for the possibility of PE.

## 2014-03-06 NOTE — Progress Notes (Signed)
CRITICAL VALUE ALERT  Critical value received:  Troponin 5.5  Date of notification:  03/06/14  Time of notification:  0012  Critical value read back:Yes.    Nurse who received alert:  D. Madelin Rear, RN  MD notified (1st page):  Benedetto Coons, NP  Time of first page:  0019  MD notified (2nd page):   Time of second page:  Responding MD:  Dr. Toniann Fail  Time MD responded:  0020  New orders received.

## 2014-03-06 NOTE — Progress Notes (Signed)
*  PRELIMINARY RESULTS* Echocardiogram 2D Echocardiogram has been performed.  [name] 03/06/2014, 9:04 AM

## 2014-03-06 NOTE — Progress Notes (Signed)
MD ordered for foley to be placed due to pt unstable status and the need for monitoring. Pericare performed. Foley catheter placed using sterile technique by RN with assistance of NT. Urine sample obtained per MD order using sterile technique. Foley care performed post placement. Pt tolerated well and scheduled to transfer to ICU. Will continue to monitor.

## 2014-03-06 NOTE — Progress Notes (Signed)
eLink Physician-Brief Progress Note Patient Name: [name] DOB: [birth year] MRN: [id number]   Date of Service  03/06/2014  HPI/Events of Note  hypoptensive  eICU Interventions  500cc fluid bolus Await cbc sent by RN     Intervention Category Intermediate Interventions: Hypotension - evaluation and management  Vlasta Baskin 03/06/2014, 5:42 PM

## 2014-03-06 NOTE — Consult Note (Addendum)
PULMONARY / CRITICAL CARE MEDICINE   Name: [name] MRN: [id number] DOB: 1955/07/14    ADMISSION DATE:  03/05/2014 CONSULTATION DATE:  9/14  REFERRING MD :  [facility]  CHIEF COMPLAINT: Left leg pain  INITIAL PRESENTATION: Left leg pain, hypotension and hypoxic.  STUDIES:    SIGNIFICANT EVENTS: 9/14 hypoxia, hypotension . ++ Trop I   HISTORY OF PRESENT ILLNESS:   58 yo smoker, PVD, CAD, recent (9-10) failed left femoral attempted stenosis angioplasty who presented with left leg pain 9/13 and found to be hypotensive, hypoxic and + troponin. She had increasing hypoxia that responded to increased fio2. Her hypotension better with IVF. On heparin drip foe + trop I. She has multiple areas at risk for infection and is on broad spectrum abx. She reports no chest pain, sweats, chills. She has a rt fem open wound that has been healng x 6 weeks and is followed by [facility]. Ct abd reveals large left femoral hematoma(now on heparin) and c x r with ? Pna. PCCM will follow.   PAST MEDICAL HISTORY :  Past Medical History  Diagnosis Date  . Diabetes mellitus   . Peripheral vascular disease   . Hyperlipidemia   . Obesity   . Hypertension   . Arthritis   . Ulcer of ankle     right  . Coronary artery disease   . COPD (chronic obstructive pulmonary disease)    Past Surgical History  Procedure Laterality Date  . Cholecystectomy    . Femoral-tibial bypass graft Right 09/27/2013    Procedure: BYPASS GRAFT FEMORAL-TIBIAL ARTERY-RIGHT;  Surgeon: Nada Libman, MD;  Location: [facility] OR;  Service: Vascular;  Laterality: Right;  . Abdominal aorta stent Right   . Bypass graft popliteal to tibial Right   . Coronary angioplasty with stent placement      proximcal RCA stent ~ 2001  . Amputation Right 10/14/2013    Procedure: AMPUTATION RAY;  Surgeon: Nadara Mustard, MD;  Location: [facility] OR;  Service: Orthopedics;  Laterality: Right;  Right 2nd Ray Amputation, [place]askin Graft Right Ankle   Prior to Admission  medications   Medication Sig Start Date End Date Taking? Authorizing Provider  alprazolam Prudy Feeler) 2 MG tablet Take 1 mg by mouth every 6 (six) hours as needed for sleep or anxiety.  04/06/12   Historical Provider, MD  aspirin EC 325 MG tablet Take 325 mg by mouth daily.    Historical Provider, MD  atorvastatin (LIPITOR) 20 MG tablet Take 20 mg by mouth daily. 03/08/12   Historical Provider, MD  escitalopram (LEXAPRO) 10 MG tablet Take 10 mg by mouth daily. 05/02/12   Historical Provider, MD  fentaNYL (DURAGESIC - DOSED MCG/HR) 100 MCG/HR Place 100 mcg onto the skin every 3 (three) days.    Historical Provider, MD  fluticasone (FLONASE) 50 MCG/ACT nasal spray Place 1 spray into both nostrils daily as needed for allergies.  07/21/13   Historical Provider, MD  gabapentin (NEURONTIN) 600 MG tablet Take 600 mg by mouth 3 (three) times daily.    Historical Provider, MD  glimepiride (AMARYL) 2 MG tablet Take 2 mg by mouth 2 (two) times daily.    Historical Provider, MD  hydrochlorothiazide (MICROZIDE) 12.5 MG capsule Take 12.5 mg by mouth daily.    Historical Provider, MD  meloxicam (MOBIC) 15 MG tablet Take 15 mg by mouth daily.    Historical Provider, MD  metFORMIN (GLUCOPHAGE) 500 MG tablet Take 1,000 mg by mouth 2 (two) times daily with a  meal.    Historical Provider, MD  oxycodone (ROXICODONE) 30 MG immediate release tablet Take 30 mg by mouth every 4 (four) hours as needed for pain.     Historical Provider, MD  quinapril (ACCUPRIL) 20 MG tablet Take 20 mg by mouth daily.    Historical Provider, MD  VESICARE 5 MG tablet Take 5 mg by mouth daily.  11/20/13   Historical Provider, MD   Allergies  Allergen Reactions  . Penicillins Hives    Tolerates cefepime  . Adhesive [Tape] Rash    FAMILY HISTORY:  Family History  Problem Relation Age of Onset  . Cancer Mother   . Heart disease Father   . Hyperlipidemia Father   . Hypertension Father   . Other Father     varicose veins  . Heart attack  Father   . Heart disease Sister   . Hyperlipidemia Sister   . Hypertension Sister   . Other Sister     varicose veins  . Heart attack Sister   . Heart disease Brother   . Hypertension Brother   . Hyperlipidemia Brother   . Other Brother     varicose veins  . Heart attack Brother    SOCIAL HISTORY:  reports that she has been smoking Cigarettes.  She has a 30 pack-year smoking history. She has never used smokeless tobacco. She reports that she does not drink alcohol or use illicit drugs.  REVIEW OF SYSTEMS:  10 point review of system taken, please see HPI for positives and negatives.   SUBJECTIVE:   VITAL SIGNS: Temp:  [98.2 F (36.8 C)-102.6 F (39.2 C)] 99.7 F (37.6 C) (09/14 0300) Pulse Rate:  [103-131] 103 (09/14 0600) Resp:  [13-38] 33 (09/14 0600) BP: (70-122)/(36-85) 98/61 mmHg (09/14 0600) SpO2:  [83 %-100 %] 98 % (09/14 0600) FiO2 (%):  [55 %] 55 % (09/14 0531) Weight:  [206 lb 9.1 oz (93.7 kg)-213 lb 10 oz (96.9 kg)] 213 lb 10 oz (96.9 kg) (09/14 0500) HEMODYNAMICS:   VENTILATOR SETTINGS: Vent Mode:  [-]  FiO2 (%):  [55 %] 55 % INTAKE / OUTPUT:  Intake/Output Summary (Last 24 hours) at 03/06/14 0655 Last data filed at 03/06/14 0600  Gross per 24 hour  Intake 5018.83 ml  Output    300 ml  Net 4718.83 ml    PHYSICAL EXAMINATION: General:  Obese, pale, sickly appearing WF in NAD Neuro:  Intact, speech clear and orientated.  HEENT:  No JVD, LAN Cardiovascular: HSR RRR ST Lungs:  Decreased air flow, no wheeze Abdomen:  Tender, large ecchymotic area left femoral area and into abd.  Musculoskeletal:  Lower ext PVD changes with multiple healing ulcers. Left foot warmer than rt Skin: Ulcers as noted, dry  LABS:  CBC  Recent Labs Lab 03/05/14 1210 03/05/14 1225 03/06/14 0415  WBC 17.1*  --  8.9  HGB 11.5* 13.9 8.3*  HCT 36.3 41.0 26.7*  PLT 305  --  202   Coag's No results found for this basename: APTT, INR,  in the last 168  hours BMET  Recent Labs Lab 03/05/14 1225 03/05/14 1650 03/06/14 0415  NA 130* 130* 132*  K 3.7 3.8 3.4*  CL 93* 92* 99  CO2  --  25 19  BUN CREATININE 0.80 0.60 0.47*  GLUCOSE 201* 175* 175*   Electrolytes  Recent Labs Lab 03/05/14 1650 03/06/14 0415  CALCIUM 8.8 7.2*   Sepsis Markers  Recent Labs Lab 03/05/14 1225 03/05/14 2308  03/06/14 0415  LATICACIDVEN 3.04* 1.5 0.8   ABG  Recent Labs Lab 03/06/14 0440  PHART 7.345*  PCO2ART 38.9  PO2ART 50.8*   Liver Enzymes  Recent Labs Lab 03/05/14 1650 03/06/14 0415  AST 31 41*  ALT 11 15  ALKPHOS 78 64  BILITOT 0.7 0.6  ALBUMIN 3.0* 2.4*   Cardiac Enzymes  Recent Labs Lab 03/05/14 2308 03/06/14 0415  TROPONINI 5.50* 6.05*   Glucose  Recent Labs Lab 03/01/14 0640 03/01/14 1026 03/05/14 2221  GLUCAP 153* 127* 184*    Imaging Dg Tibia/fibula Left  03/05/2014   CLINICAL DATA:  Lower leg pain.  EXAM: LEFT TIBIA AND FIBULA - 2 VIEW  COMPARISON:  Screws in the distal tibia and side plate and screws in the distal fibula. The fractures have healed. 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 No acute osseous abnormality. Extensive vascular calcifications and dermal calcifications in the lower leg.  FINDINGS: No acute abnormalities. Arthritis at the knee and ankle. Postsurgical changes.  IMPRESSION: Negative.   Electronically Signed   By: Geanie Cooley M.D.   On: 03/05/2014 22:10   Dg Chest Portable 1 View  03/05/2014   CLINICAL DATA:  Leg pain.  Smoker.  EXAM: PORTABLE CHEST - 1 VIEW  COMPARISON:  Chest radiograph 09/25/2013  FINDINGS: Cardiac leads project over the chest. Cardiomediastinal silhouette is stable. There is atherosclerotic calcification in the thoracic aortic arch. Lung volumes are low resulting in crowding of pulmonary vascular markings. No airspace disease or pulmonary edema is identified. No acute bony  abnormality appreciated.  IMPRESSION: Low lung volumes.  No acute findings identified.  Atherosclerosis of the thoracic aorta.   Electronically Signed   By: Britta Mccreedy M.D.   On: 03/05/2014 19:15   Dg Foot Complete Left  03/05/2014   CLINICAL DATA:  Left foot pain. Soft tissue ulceration. Redness and discoloration.  EXAM: LEFT FOOT - COMPLETE 3+ VIEW  COMPARISON:  08/10/2012  FINDINGS: There is no fracture or dislocation or bone destruction suggestive of osteomyelitis. There are 2 screws in the distal tibia and a side plate and multiple screws in the distal fibula. 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 Small plantar calcaneal spur.  IMPRESSION: No acute abnormalities.  No change since the prior exam.   Electronically Signed   By: Geanie Cooley M.D.   On: 03/05/2014 22:09     ASSESSMENT / PLAN:  PULMONARY OETT A: Hypoxia Tobacco abuse HCAP (evolving infiltrate RLL) AE COPD? > Emphysema on CT scan, no prior diagnosis of COPD P:   O2 as needed(currently 100% on v mask BD scheduled and PRN Check ABG Add solumedrol for wheezing, dyspnea CxR with opacity may reflect infectious process. See abx section. Move to ICU for closer monitoring  CARDIOVASCULAR CVL A:  NSTEMI>+ trop I 6.5 9/14 incidental finding. Hypotension on  A diuretic and 2 antihypertensives Sepsis(fever 102.5, sbp <80, tachycardia P:  Cards is managing. Hx of CAD + stent 15 years ago Heparin per Cards ? Cath when stable Hydration Stop bp meds and diuretics May need pressors and cvl if refractory to current treatment Check cortisol level   RENAL A:   No acute issue P:     GASTROINTESTINAL A:   No acute issue P:   Not on PPI  HEMATOLOGIC A:   Chronic anemia L inguinal hematoma P:  transfuse per protocol for hgb <7.0 Monitor left groin  closely Serial cbc given heparin and groin hematoma  INFECTIOUS A:   Sepsis with fever 102.5 > source uncertain > abscess area of recent  cannulation? HCAP? Leg ulcers? Chronic draining R femoral wound?  P:   BCx2  9/14>> UC  9/14>> Sputum Abx: Vanc  , start date 9/14, day 0/x Abx: cefepime, start date 9/14, day  0/x   ENDOCRINE A:   DM last Hbg a1c  7.2 P:   PER IM  NEUROLOGIC A:  No acute issue P:     TODAY'S SUMMARY:  58 yo smoker, PVD, CAD, recent (9-10) failed left femoral attempted stenosis angioplasty who presented with left leg pain 9/13 and found to be hypotensive, hypoxic and + troponin. PCCM asked to evaluate. Also with likely sepsis, but no clear source.  Treating empirically for HCAP and AE COPD.  Appreciate cardiology assistance with NSTEMI.  Attending:  I have seen and examined the patient with nurse practitioner/resident and agree with the note above.   She is wheezing on my exam and with her NSTEMI and possible sepsis I think she needs to be in the ICU. Will transfer, start steroids for possible AE COPD, check an ABG.  Likely HCAP .  Appreciate cardiology assistance.  Serial monitoring of left leg> on my exam doesn't appear overtly infected but needs close monitoring.   I have personally obtained a history, examined the patient, evaluated laboratory and imaging results, formulated the assessment and plan and placed orders. CRITICAL CARE: The patient is critically ill with multiple organ systems failure and requires high complexity decision making for assessment and support, frequent evaluation and titration of therapies, application of advanced monitoring technologies and extensive interpretation of multiple databases. Critical Care Time devoted to patient care services described in this note is 45 minutes.    Heber Putnam, MD [place] [facility] Pager: [phone] Cell: [phone] If no response, call [phone]   03/06/2014, 6:55 AM   \

## 2014-03-07 ENCOUNTER — Inpatient Hospital Stay (HOSPITAL_COMMUNITY): Payer: BC Managed Care – PPO

## 2014-03-07 DIAGNOSIS — I2589 Other forms of chronic ischemic heart disease: Secondary | ICD-10-CM

## 2014-03-07 DIAGNOSIS — L97509 Non-pressure chronic ulcer of other part of unspecified foot with unspecified severity: Secondary | ICD-10-CM

## 2014-03-07 DIAGNOSIS — M79609 Pain in unspecified limb: Secondary | ICD-10-CM

## 2014-03-07 DIAGNOSIS — Z48812 Encounter for surgical aftercare following surgery on the circulatory system: Secondary | ICD-10-CM

## 2014-03-07 DIAGNOSIS — A4901 Methicillin susceptible Staphylococcus aureus infection, unspecified site: Secondary | ICD-10-CM

## 2014-03-07 DIAGNOSIS — I739 Peripheral vascular disease, unspecified: Secondary | ICD-10-CM

## 2014-03-07 DIAGNOSIS — J9600 Acute respiratory failure, unspecified whether with hypoxia or hypercapnia: Secondary | ICD-10-CM

## 2014-03-07 DIAGNOSIS — R7881 Bacteremia: Secondary | ICD-10-CM

## 2014-03-07 LAB — GLUCOSE, CAPILLARY
GLUCOSE-CAPILLARY: 187 mg/dL — AB (ref 70–99)
Glucose-Capillary: 203 mg/dL — ABNORMAL HIGH (ref 70–99)
Glucose-Capillary: 211 mg/dL — ABNORMAL HIGH (ref 70–99)
Glucose-Capillary: 234 mg/dL — ABNORMAL HIGH (ref 70–99)
Glucose-Capillary: 268 mg/dL — ABNORMAL HIGH (ref 70–99)
Glucose-Capillary: 278 mg/dL — ABNORMAL HIGH (ref 70–99)

## 2014-03-07 LAB — URINE CULTURE
CULTURE: NO GROWTH
Colony Count: NO GROWTH
Colony Count: NO GROWTH
Culture: NO GROWTH

## 2014-03-07 LAB — HEPARIN LEVEL (UNFRACTIONATED)
Heparin Unfractionated: <0.1 IU/mL — ABNORMAL LOW (ref 0.30–0.70)
Heparin Unfractionated: 0.29 IU/mL — ABNORMAL LOW (ref 0.30–0.70)

## 2014-03-07 LAB — PHOSPHORUS
Phosphorus: 1.4 mg/dL — ABNORMAL LOW (ref 2.3–4.6)
Phosphorus: 2.5 mg/dL (ref 2.3–4.6)

## 2014-03-07 LAB — MAGNESIUM
Magnesium: 1.8 mg/dL (ref 1.5–2.5)
Magnesium: 2.4 mg/dL (ref 1.5–2.5)
Magnesium: 2.2 mg/dL (ref 1.5–2.5)

## 2014-03-07 LAB — CBC
HEMATOCRIT: 29.7 % — AB (ref 36.0–46.0)
Hemoglobin: 9.1 g/dL — ABNORMAL LOW (ref 12.0–15.0)
MCH: 22 pg — AB (ref 26.0–34.0)
MCHC: 30.6 g/dL (ref 30.0–36.0)
MCV: 71.9 fL — AB (ref 78.0–100.0)
PLATELETS: 334 10*3/uL (ref 150–400)
RBC: 4.13 MIL/uL (ref 3.87–5.11)
RDW: 18.8 % — AB (ref 11.5–15.5)
WBC: 17.2 10*3/uL — ABNORMAL HIGH (ref 4.0–10.5)

## 2014-03-07 LAB — LEGIONELLA ANTIGEN, URINE: Legionella Antigen, Urine: NEGATIVE

## 2014-03-07 LAB — POCT I-STAT 3, ART BLOOD GAS (G3+)
ACID-BASE DEFICIT: 5 mmol/L — AB (ref 0.0–2.0)
Bicarbonate: 20.5 mEq/L (ref 20.0–24.0)
O2 SAT: 94 %
PO2 ART: 76 mmHg — AB (ref 80.0–100.0)
TCO2: 22 mmol/L (ref 0–100)
pCO2 arterial: 40.3 mmHg (ref 35.0–45.0)
pH, Arterial: 7.313 — ABNORMAL LOW (ref 7.350–7.450)

## 2014-03-07 LAB — CORTISOL: CORTISOL PLASMA: 63.3 ug/dL

## 2014-03-07 LAB — VANCOMYCIN, TROUGH: VANCOMYCIN TR: 17.3 ug/mL (ref 10.0–20.0)

## 2014-03-07 LAB — PROCALCITONIN: Procalcitonin: 1.34 ng/mL

## 2014-03-07 MED ORDER — METHYLPREDNISOLONE SODIUM SUCC 40 MG IJ SOLR
40.0000 mg | Freq: Two times a day (BID) | INTRAMUSCULAR | Status: DC
  Administered 2014-03-07 – 2014-03-08 (×3): 40 mg via INTRAVENOUS
  Filled 2014-03-07 (×4): qty 1

## 2014-03-07 MED ORDER — VITAL HIGH PROTEIN PO LIQD
1000.0000 mL | ORAL | Status: DC
Start: 2014-03-08 — End: 2014-03-11
  Administered 2014-03-08 (×2): 1000 mL
  Administered 2014-03-09 – 2014-03-11 (×16)
  Administered 2014-03-11 (×2): 1000 mL
  Administered 2014-03-11 (×3)
  Filled 2014-03-07 (×6): qty 1000

## 2014-03-07 MED ORDER — MAGNESIUM SULFATE 40 MG/ML IJ SOLN
2.0000 g | Freq: Once | INTRAMUSCULAR | Status: AC
  Administered 2014-03-07: 2 g via INTRAVENOUS
  Filled 2014-03-07: qty 50

## 2014-03-07 MED ORDER — DEXTROSE 5 % IV SOLN
2.0000 g | Freq: Two times a day (BID) | INTRAVENOUS | Status: DC
  Administered 2014-03-07 – 2014-03-12 (×12): 2 g via INTRAVENOUS
  Filled 2014-03-07 (×14): qty 2

## 2014-03-07 MED ORDER — PRO-STAT SUGAR FREE PO LIQD
30.0000 mL | Freq: Two times a day (BID) | ORAL | Status: AC
Start: 2014-03-07 — End: 2014-03-08
  Filled 2014-03-07 (×2): qty 30

## 2014-03-07 MED ORDER — VITAL HIGH PROTEIN PO LIQD
1000.0000 mL | ORAL | Status: DC
Start: 2014-03-07 — End: 2014-03-07
  Filled 2014-03-07 (×2): qty 1000

## 2014-03-07 MED ORDER — SODIUM PHOSPHATE 3 MMOLE/ML IV SOLN
30.0000 mmol | Freq: Once | INTRAVENOUS | Status: AC
  Administered 2014-03-07: 30 mmol via INTRAVENOUS
  Filled 2014-03-07: qty 10

## 2014-03-07 MED ORDER — CEFAZOLIN SODIUM-DEXTROSE 2-3 GM-% IV SOLR
2.0000 g | Freq: Three times a day (TID) | INTRAVENOUS | Status: DC
  Filled 2014-03-07 (×2): qty 50

## 2014-03-07 MED ORDER — INSULIN ASPART 100 UNIT/ML ~~LOC~~ SOLN
0.0000 [IU] | SUBCUTANEOUS | Status: DC
  Administered 2014-03-07: 8 [IU] via SUBCUTANEOUS
  Administered 2014-03-07 (×2): 5 [IU] via SUBCUTANEOUS
  Administered 2014-03-07: 8 [IU] via SUBCUTANEOUS
  Administered 2014-03-07: 5 [IU] via SUBCUTANEOUS
  Administered 2014-03-07 – 2014-03-08 (×2): 3 [IU] via SUBCUTANEOUS
  Administered 2014-03-08 (×2): 5 [IU] via SUBCUTANEOUS

## 2014-03-07 MED ORDER — PHENYLEPHRINE HCL 10 MG/ML IJ SOLN
30.0000 ug/min | INTRAVENOUS | Status: DC
  Administered 2014-03-07: 60 ug/min via INTRAVENOUS
  Administered 2014-03-07: 45.067 ug/min via INTRAVENOUS
  Filled 2014-03-07 (×3): qty 4

## 2014-03-07 NOTE — Progress Notes (Signed)
dcd droplet precaution , ordered by Dr Tyson Alias

## 2014-03-07 NOTE — Progress Notes (Signed)
ANTIBIOTIC + ANTICOAGULATION CONSULT NOTE   Pharmacy Consult for vancomycin, heparin  Indication: bacteremia, ACS  Allergies  Allergen Reactions  . Penicillins Hives    Tolerates cefepime  . Adhesive [Tape] Rash    Patient Measurements: Height:  (157.5 cm) Weight: 220 lb 14.4 oz (100.2 kg) IBW/kg (Calculated) : 50.1 Heparin dosing weight: 73.5 kg  Vital Signs: Temp: 98 F (36.7 C) (09/15 1100) Temp src: Oral (09/15 1100) BP: 120/76 mmHg (09/15 1100) Pulse Rate: 101 (09/15 1100) Intake/Output from previous day: 09/14 0701 - 09/15 0700 In: 3943.1 [I.V.:2493.1; NG/GT:100; IV Piggyback:1350] Out: 1500 [Urine:1500] Intake/Output from this shift: Total I/O In: 282.5 [I.V.:139.5; IV Piggyback:143] Out: 200 [Urine:200]  Labs:  Recent Labs  03/05/14 1225 03/05/14 1650 03/06/14 0415  03/06/14 1300 03/06/14 1800 03/07/14 0500  WBC  --   --  8.9  < > 7.8 7.8 17.2*  HGB 13.9  --  8.3*  < > 8.6* 8.9* 9.1*  PLT  --   --  202  < > 194 180 334  CREATININE 0.80 0.60 0.47*  --   --   --   --   < > = values in this interval not displayed. Estimated Creatinine Clearance: 86.9 ml/min (by C-G formula based on Cr of 0.47). No results found for this basename: VANCOTROUGH, VANCOPEAK, VANCORANDOM, GENTTROUGH, GENTPEAK, GENTRANDOM, TOBRATROUGH, TOBRAPEAK, TOBRARND, AMIKACINPEAK, AMIKACINTROU, AMIKACIN,  in the last 72 hours   Medical History: Past Medical History  Diagnosis Date  . Diabetes mellitus   . Peripheral vascular disease   . Hyperlipidemia   . Obesity   . Hypertension   . Arthritis   . Ulcer of ankle     right  . Coronary artery disease   . COPD (chronic obstructive pulmonary disease)     Medications:  See EMR  Assessment: 58 yo female on d#3 vancomycin and cefepime for staph aureus bacteremia in 2/2 blood cx, remains on cefepime for pseudomonas in wound culture from heel. Vanc trough returned at 17.3, therapeutic.  Pt also remains on heparin gtt for ACS. Is  in need of cath when hemodynamically stable.  Heparin level has been subtherapeutic.  Hgb low but stable, plts wnl  Goal of Therapy:  Vancomycin trough level 15-20 mcg/ml  Plan:  -will narrow abx pending speciation of staph aureus -continue vanc 1g/8h -continue cefepime 2g/12h -f/u renal fx, c+s, duration -heparin drip at 1600 units/hr -daily HL, CBC -HL this afternoon   Agapito Games, PharmD, BCPS Clinical Pharmacist Pager: [phone] 03/07/2014 12:05 PM

## 2014-03-07 NOTE — Progress Notes (Signed)
Inpatient Diabetes Program Recommendations  AACE/ADA: New Consensus Statement on Inpatient Glycemic Control (2013)  Target Ranges:  Prepandial:   less than 140 mg/dL      Peak postprandial:   less than 180 mg/dL (1-2 hours)      Critically ill patients:  140 - 180 mg/dL   Reason for Assessment: Hyperglycemia  Diabetes history: Type 2 Outpatient Diabetes medications: Amaryl 2 mg bid, Metformin 1000 mg bid Current orders for Inpatient glycemic control: Moderate Novolog Correction Scale   Note: Solumedrol has been decreased from 60 mg q 12 to 40 mg q 12.  Correction scale increased from sensitive to moderate.  Starting TF today.  Once patient reaches goal of Vital HP at 40/hr, request MD consider adding Novolog 2 units every 4 hours as tube feed coverage-- in addition to correction and to be held if TF held for any reason.  May also want to consider using the Adult ICU Glycemic Control Order Set.  Thank you.  [name] S. Elsie Lincoln, RN, CNS, CDE Inpatient Diabetes Program, team pager [phone]

## 2014-03-07 NOTE — Progress Notes (Addendum)
Progress Note    03/07/2014 10:48 AM Hospital Day 2  Subjective:  On the vent-answers yes and no questions.  Still with some left leg pain, but has improved.  Tm 100 now afebrile HR   Filed Vitals:   03/07/14 0917  BP: 104/67  Pulse: 84  Temp:   Resp: 14    Physical Exam: Lungs:  intubated Incisions:  Right groin incision is healing Extremities:  Right foot cool-pedal pulses are not palpable.  +palpable left popliteal pulse. Sensation and motor is in tact.  Significant ecchymosis over pubic area-soft.  CBC    Component Value Date/Time   WBC 17.2* 03/07/2014 0500   RBC 4.13 03/07/2014 0500   HGB 9.1* 03/07/2014 0500   HCT 29.7* 03/07/2014 0500   PLT 334 03/07/2014 0500   MCV 71.9* 03/07/2014 0500   MCH 22.0* 03/07/2014 0500   MCHC 30.6 03/07/2014 0500   RDW 18.8* 03/07/2014 0500   LYMPHSABS 0.5* 03/06/2014 0415   MONOABS 0.6 03/06/2014 0415   EOSABS 0.0 03/06/2014 0415   BASOSABS 0.0 03/06/2014 0415    BMET    Component Value Date/Time   NA 132* 03/06/2014 0415   K 3.4* 03/06/2014 0415   CL 99 03/06/2014 0415   CO2 19 03/06/2014 0415   GLUCOSE 175* 03/06/2014 0415   BUN 10 03/06/2014 0415   CREATININE 0.47* 03/06/2014 0415   CREATININE 0.44* 07/22/2013 1612   CALCIUM 7.2* 03/06/2014 0415   GFRNONAA >90 03/06/2014 0415   GFRAA >90 03/06/2014 0415    INR    Component Value Date/Time   INR 1.45 03/06/2014 1300     Intake/Output Summary (Last 24 hours) at 03/07/14 1048 Last data filed at 03/07/14 0700  Gross per 24 hour  Intake 3402.26 ml  Output    600 ml  Net 2802.26 ml   CT of abdomen & Pelvis 03/06/14: IMPRESSION:  1. Left inguinal hematoma. This does not extend into the peritoneal  cavity.  2. Midline abdominal hernia contains mesenteric fat and nondilated  loops of small bowel.  3. Distended urinary bladder.  4. Normal appendix.  5. Scattered emphysematous changes.  6. Coronary artery calcifications.  7. Calcified granulomata in the liver.  8. Benign right  adrenal gland lesion.   Assessment:  58 y.o. female is s/p:  A right femoral TP trunk bypass with ipsilateral reversed greater saphenous vein on 10/01/13.  She also underwent right 2nd toe ray amputation by Dr. Lajoyce Corners on 10/14/13.   In early September, u/s identified a possible stenosis at the distal anastomosis and underwent arteriogram via left groin at that time on 03/01/14.   Plan: -pt with NSTEMI and hypoxemia requiring intubation (HCAP) -increased leukocytosis today related to PNA.  ABx per ID -wounds on foot not likely causing sepsis -DVT prophylaxis:  Heparin gtt -left leg pain improved today per pt--will address her left leg when other medical issues improve. -Dr. [name] to review CT, unable to pull up pictures on computer.   Doreatha Massed, PA-C [facility] [phone] 03/07/2014 10:48 AM    I agree with the above.  I have seen and examined the patient.  She remained intubated secondary to sepsis, likely related to pneumonia.  She is on IV antibiotics.  The patient initially had left leg pain, however she is able to tell us that it is improved.  She has healing ulcers on both lower extremities.  She has undergone successful revascularization of her right leg.  Her left leg has not  been evaluated for revascularization.  She does have chronic ulceration.  It does not appear that the left leg ulcers are the source of her sepsis.  I would not pursue additional vascular workup at this time, until she begins to sew signs of improvement from a pulmonary perspective.  We will continue to follow the patient lost his in hospital.  Durene Cal

## 2014-03-07 NOTE — Progress Notes (Signed)
ANTICOAGULATION CONSULT NOTE - Follow Up Consult  Pharmacy Consult for Heparin Indication: chest pain/ACS  Allergies  Allergen Reactions  . Penicillins Hives    Tolerates cefepime  . Adhesive [Tape] Rash    Patient Measurements: Height:  (157.5 cm) Weight: 220 lb 14.4 oz (100.2 kg) IBW/kg (Calculated) : 50.1 Heparin Dosing Weight: 73.5 kg  Vital Signs: Temp: 98 F (36.7 C) (09/15 1100) Temp src: Oral (09/15 1100) BP: 112/61 mmHg (09/15 1548) Pulse Rate: 85 (09/15 1548)  Labs:  Recent Labs  03/05/14 1225 03/05/14 1650  03/06/14 0415  03/06/14 0800 03/06/14 1015 03/06/14 1300 03/06/14 1718 03/06/14 1800 03/06/14 1830 03/07/14 0500 03/07/14 1530  HGB 13.9  --   --  8.3*  < >  --   --  8.6*  --  8.9*  --  9.1*  --   HCT 41.0  --   --  26.7*  < >  --   --  27.9*  --  28.5*  --  29.7*  --   PLT  --   --   --  202  < >  --   --  194  --  180  --  334  --   LABPROT  --   --   --   --   --   --   --  17.6*  --   --   --   --   --   INR  --   --   --   --   --   --   --  1.45  --   --   --   --   --   HEPARINUNFRC  --   --   --   --   < > <0.10*  --   --   --   --  <0.10* <0.10* 0.29*  CREATININE 0.80 0.60  --  0.47*  --   --   --   --   --   --   --   --   --   CKTOTAL  --   --   --   --   --  806*  --   --   --   --   --   --   --   TROPONINI  --   --   < > 6.05*  --   --  10.57*  --  16.99*  --   --   --   --   < > = values in this interval not displayed.  Estimated Creatinine Clearance: 86.9 ml/min (by C-G formula based on Cr of 0.47).   Medications:  Scheduled:  . antiseptic oral rinse  7 mL Mouth Rinse QID  . aspirin  81 mg Per Tube Daily  . atorvastatin  40 mg Per Tube q1800  . ceFEPime (MAXIPIME) IV  2 g Intravenous Q12H  . chlorhexidine  15 mL Mouth Rinse BID  . darifenacin  7.5 mg Oral Daily  . escitalopram  10 mg Per Tube Daily  . famotidine (PEPCID) IV  20 mg Intravenous Q12H  . feeding supplement (PRO-STAT SUGAR FREE 64)  30 mL Per Tube BID  .  [START ON 03/08/2014] feeding supplement (VITAL HIGH PROTEIN)  1,000 mL Per Tube Q24H  . insulin aspart  0-15 Units Subcutaneous 6 times per day  . ipratropium-albuterol  3 mL Nebulization Q6H  . methylPREDNISolone (SOLU-MEDROL) injection  40 mg Intravenous Q12H  . vancomycin  1,000  mg Intravenous Q8H   Infusions:  . fentaNYL infusion INTRAVENOUS 100 mcg/hr (03/07/14 1300)  . heparin 1,600 Units/hr (03/07/14 1541)  . phenylephrine (NEO-SYNEPHRINE) Adult infusion 45.067 mcg/min (03/07/14 1500)    Assessment: 58 yo F continues on heparin for ACS.  Heparin level near goal on 1600 units/hr.  Goal of Therapy:  Heparin level 0.3-0.7 units/ml Monitor platelets by anticoagulation protocol: Yes   Plan:  Increase heparin infusion to 1750 units/hr. Heparin level in 6 hours. Continue daily heparin level and CBC.  [employer], Pharm.D., BCPS Clinical Pharmacist Pager [phone] 03/07/2014 5:12 PM

## 2014-03-07 NOTE — Consult Note (Signed)
[name]  Date of Admission:  03/05/2014  Date of Consult:  03/07/2014  Reason for Consult: staph bacteremia Referring Physician: CHAMP  Impression/Recommendation Staph bacteremia PVD R heel ulcer- Cx pseudomonas NSTEMI DM2 Tobacco use  Stop ancef, switch back to cefepime Repeat BCx Check TEE Check HIV Stop respiratory isolation  Comment: Repeat BCx are pending. Needs TEE to eval her valves.  Not clear of significance of her pseudomonas in her wound, it seems clean.  Not clear why she is on respiratory isolation, I have not see influenza in the community yet...  Thank you so much for this interesting consult,   Bobby Rumpf (pager) [phone] [email or web address]  [name] is an 58 y.o. female.  HPI: 58 yo F with hx of PTCA, PVD, R fem-tib bypass 09-2012, and recent LLE arteriogram (9-10). She comes to ED on 9-13 with 2 days of worsening LLE pain. In ED she was hypotensive and had temp 101.9. WBC 17.1. She was noted to have d/c from her R groin wound  And discoloration of her L groin as well. She was started on vanco/cefepime.  In ED pt's troponin was positive. She was also found to have L inguinal hematoma on CT.  By 9-14 she had temp 102.9, became hypoxic and was noted to have RLL infiltrate on CXR. Her pH was 7.049. She was moved to ICU. She required intubation and insertion of TLC.   WBC has increased back to 17.2 today.   Past Medical History  Diagnosis Date  . Diabetes mellitus   . Peripheral vascular disease   . Hyperlipidemia   . Obesity   . Hypertension   . Arthritis   . Ulcer of ankle     right  . Coronary artery disease   . COPD (chronic obstructive pulmonary disease)     Past Surgical History  Procedure Laterality Date  . Cholecystectomy    . Femoral-tibial bypass graft Right 09/27/2013    Procedure: BYPASS GRAFT FEMORAL-TIBIAL ARTERY-RIGHT;  Surgeon: Serafina Mitchell, MD;  Location: [facility] OR;  Service:  Vascular;  Laterality: Right;  . Abdominal aorta stent Right   . Bypass graft popliteal to tibial Right   . Coronary angioplasty with stent placement      proximcal RCA stent ~ 2001  . Amputation Right 10/14/2013    Procedure: AMPUTATION RAY;  Surgeon: Newt Minion, MD;  Location: [facility];  Service: Orthopedics;  Laterality: Right;  Right 2nd Ray Amputation, [place]in Graft Right Ankle     Allergies  Allergen Reactions  . Penicillins Hives    Tolerates cefepime  . Adhesive [Tape] Rash    Medications:  Scheduled: . antiseptic oral rinse  7 mL Mouth Rinse QID  . aspirin  81 mg Per Tube Daily  . atorvastatin  40 mg Per Tube q1800  .  ceFAZolin (ANCEF) IV  2 g Intravenous 3 times per day  . chlorhexidine  15 mL Mouth Rinse BID  . darifenacin  7.5 mg Oral Daily  . escitalopram  10 mg Per Tube Daily  . famotidine (PEPCID) IV  20 mg Intravenous Q12H  . feeding supplement (PRO-STAT SUGAR FREE 64)  30 mL Per Tube BID  . feeding supplement (VITAL HIGH PROTEIN)  1,000 mL Per Tube Q24H  . insulin aspart  0-15 Units Subcutaneous 6 times per day  . ipratropium-albuterol  3 mL Nebulization Q6H  . magnesium sulfate 1 - 4 g bolus IVPB  2 g Intravenous Once  .  methylPREDNISolone (SOLU-MEDROL) injection  40 mg Intravenous Q12H  . sodium phosphate  Dextrose 5% IVPB  30 mmol Intravenous Once  . vancomycin  1,000 mg Intravenous Q8H    Abtx:  Anti-infectives   Start     Dose/Rate Route Frequency Ordered Stop   03/07/14 0930  ceFAZolin (ANCEF) IVPB 2 g/50 mL premix     2 g 100 mL/hr over 30 Minutes Intravenous 3 times per day 03/07/14 0853     03/06/14 0400  ceFEPIme (MAXIPIME) 1 g in dextrose 5 % 50 mL IVPB  Status:  Discontinued     1 g 100 mL/hr over 30 Minutes Intravenous Every 8 hours 03/05/14 1832 03/07/14 0849   03/06/14 0400  vancomycin (VANCOCIN) IVPB 1000 mg/200 mL premix     1,000 mg 200 mL/hr over 60 Minutes Intravenous Every 8 hours 03/05/14 1839     03/05/14 1845  ceFEPIme  (MAXIPIME) 1 g in dextrose 5 % 50 mL IVPB     1 g 100 mL/hr over 30 Minutes Intravenous  Once 03/05/14 1832 03/05/14 2014   03/05/14 1845  vancomycin (VANCOCIN) 500 mg in sodium chloride 0.9 % 100 mL IVPB  Status:  Discontinued     500 mg 100 mL/hr over 60 Minutes Intravenous  Once 03/05/14 1839 03/07/14 0854   03/05/14 1830  vancomycin (VANCOCIN) IVPB 1000 mg/200 mL premix     1,000 mg 200 mL/hr over 60 Minutes Intravenous  Once 03/05/14 1820 03/05/14 2121      Total days of antibiotics: 2  9/13 vanco 9/13 cefepime 9/15 9/15 cefazolin        Social History:  reports that she has been smoking Cigarettes.  She has a 30 pack-year smoking history. She has never used smokeless tobacco. She reports that she does not drink alcohol or use illicit drugs.  Family History  Problem Relation Age of Onset  . Cancer Mother   . Heart disease Father   . Hyperlipidemia Father   . Hypertension Father   . Other Father     varicose veins  . Heart attack Father   . Heart disease Sister   . Hyperlipidemia Sister   . Hypertension Sister   . Other Sister     varicose veins  . Heart attack Sister   . Heart disease Brother   . Hypertension Brother   . Hyperlipidemia Brother   . Other Brother     varicose veins  . Heart attack Brother     General ROS: on vent. unable to obtain.   Blood pressure 104/67, pulse 84, temperature 98.1 F (36.7 C), temperature source Oral, resp. rate 14, height $RemoveBe'5\' 2"'KAHZkByvn$  (1.575 m), weight 100.2 kg (220 lb 14.4 oz), SpO2 95.00%. General appearance: alert, cooperative and no distress Eyes: negative findings: pupils equal, round, reactive to light and accomodation Neck: L IJ TLC clean Lungs: wheezes anterior - left Heart: regular rate and rhythm Abdomen: abnormal findings:  distended and hypoactive bowel sounds Pelvic: wound in R groin is superficial, clena, no d/c. large area of echymoses on midline L supra-pubic area.  Extremities: feet are cold. scabbed wound on her  LLE. superficial ulcer on her R ankle. no d/c.    Results for orders placed during the hospital encounter of 03/05/14 (from the past 48 hour(s))  CBC WITH DIFFERENTIAL     Status: Abnormal   Collection Time    03/05/14 12:10 PM      Result Value Ref Range   WBC 17.1 (*) 4.0 -  10.5 K/uL   RBC 5.15 (*) 3.87 - 5.11 MIL/uL   Hemoglobin 11.5 (*) 12.0 - 15.0 g/dL   HCT 36.3  36.0 - 46.0 %   MCV 70.5 (*) 78.0 - 100.0 fL   MCH 22.3 (*) 26.0 - 34.0 pg   MCHC 31.7  30.0 - 36.0 g/dL   RDW 18.3 (*) 11.5 - 15.5 %   Platelets 305  150 - 400 K/uL   Neutrophils Relative % 89 (*) 43 - 77 %   Neutro Abs 15.3 (*) 1.7 - 7.7 K/uL   Lymphocytes Relative 4 (*) 12 - 46 %   Lymphs Abs 0.7  0.7 - 4.0 K/uL   Monocytes Relative 7  3 - 12 %   Monocytes Absolute 1.1 (*) 0.1 - 1.0 K/uL   Eosinophils Relative 0  0 - 5 %   Eosinophils Absolute 0.0  0.0 - 0.7 K/uL   Basophils Relative 0  0 - 1 %   Basophils Absolute 0.0  0.0 - 0.1 K/uL  I-STAT CG4 LACTIC ACID, ED     Status: Abnormal   Collection Time    03/05/14 12:25 PM      Result Value Ref Range   Lactic Acid, Venous 3.04 (*) 0.5 - 2.2 mmol/L  I-STAT CHEM 8, ED     Status: Abnormal   Collection Time    03/05/14 12:25 PM      Result Value Ref Range   Sodium 130 (*) 137 - 147 mEq/L   Potassium 3.7  3.7 - 5.3 mEq/L   Chloride 93 (*) 96 - 112 mEq/L   BUN 12  6 - 23 mg/dL   Creatinine, Ser 0.80  0.50 - 1.10 mg/dL   Glucose, Bld 201 (*) 70 - 99 mg/dL   Calcium, Ion 1.16  1.12 - 1.23 mmol/L   TCO2 26  0 - 100 mmol/L   Hemoglobin 13.9  12.0 - 15.0 g/dL   HCT 41.0  36.0 - 46.0 %  COMPREHENSIVE METABOLIC PANEL     Status: Abnormal   Collection Time    03/05/14  4:50 PM      Result Value Ref Range   Sodium 130 (*) 137 - 147 mEq/L   Potassium 3.8  3.7 - 5.3 mEq/L   Chloride 92 (*) 96 - 112 mEq/L   CO2 25  19 - 32 mEq/L   Glucose, Bld 175 (*) 70 - 99 mg/dL   BUN 13  6 - 23 mg/dL   Creatinine, Ser 0.60  0.50 - 1.10 mg/dL   Calcium 8.8  8.4 - 10.5 mg/dL     Total Protein 6.6  6.0 - 8.3 g/dL   Albumin 3.0 (*) 3.5 - 5.2 g/dL   AST 31  0 - 37 U/L   ALT 11  0 - 35 U/L   Alkaline Phosphatase 78  39 - 117 U/L   Total Bilirubin 0.7  0.3 - 1.2 mg/dL   GFR calc non Af Amer >90  >90 mL/min   GFR calc Af Amer >90  >90 mL/min   Comment: (NOTE)     The eGFR has been calculated using the CKD EPI equation.     This calculation has not been validated in all clinical situations.     eGFR's persistently <90 mL/min signify possible Chronic Kidney     Disease.   Anion gap 13  5 - 15  URINALYSIS, ROUTINE W REFLEX MICROSCOPIC     Status: Abnormal   Collection Time  03/05/14  5:05 PM      Result Value Ref Range   Color, Urine YELLOW  YELLOW   APPearance CLOUDY (*) CLEAR   Specific Gravity, Urine 1.020  1.005 - 1.030   pH 5.0  5.0 - 8.0   Glucose, UA NEGATIVE  NEGATIVE mg/dL   Hgb urine dipstick NEGATIVE  NEGATIVE   Bilirubin Urine NEGATIVE  NEGATIVE   Ketones, ur NEGATIVE  NEGATIVE mg/dL   Protein, ur NEGATIVE  NEGATIVE mg/dL   Urobilinogen, UA 1.0  0.0 - 1.0 mg/dL   Nitrite NEGATIVE  NEGATIVE   Leukocytes, UA NEGATIVE  NEGATIVE   Comment: MICROSCOPIC NOT DONE ON URINES WITH NEGATIVE PROTEIN, BLOOD, LEUKOCYTES, NITRITE, OR GLUCOSE <1000 mg/dL.  URINE CULTURE     Status: None   Collection Time    03/05/14  5:05 PM      Result Value Ref Range   Specimen Description URINE, RANDOM     Special Requests NONE     Culture  Setup Time       Value: 03/05/2014 18:00     Performed at [facility]       Value: NO GROWTH     Performed at [facility]   Culture       Value: NO GROWTH     Performed at [facility]   Report Status 03/07/2014 FINAL    CULTURE, BLOOD (ROUTINE X 2)     Status: None   Collection Time    03/05/14  5:12 PM      Result Value Ref Range   Specimen Description BLOOD RIGHT ARM     Special Requests BOTTLES DRAWN AEROBIC AND ANAEROBIC [place] EA     Culture  Setup Time       Value: 03/06/2014  00:49     Performed at [facility]   Culture       Value: STAPHYLOCOCCUS AUREUS     Note: RIFAMPIN AND GENTAMICIN SHOULD NOT BE USED AS SINGLE DRUGS FOR TREATMENT OF STAPH INFECTIONS.     Note: Gram Stain Report Called to,Read Back By and Verified With: THERESA@1 :30PM ON 03/06/14 BY DANTS     Performed at [facility]   Report Status PENDING    CULTURE, BLOOD (ROUTINE X 2)     Status: None   Collection Time    03/05/14  6:05 PM      Result Value Ref Range   Specimen Description BLOOD RIGHT FOREARM     Special Requests BOTTLES DRAWN AEROBIC AND ANAEROBIC 10CC EA     Culture  Setup Time       Value: 03/06/2014 00:49     Performed at [facility]   Culture       Value: STAPHYLOCOCCUS AUREUS     Note: Gram Stain Report Called to,Read Back By and Verified With: THERESA@1 :30PM ON 03/06/14 BY DANTS     Performed at [facility]   Report Status PENDING    GLUCOSE, CAPILLARY     Status: Abnormal   Collection Time    03/05/14 10:21 PM      Result Value Ref Range   Glucose-Capillary 184 (*) 70 - 99 mg/dL   Comment 1 Notify RN    MRSA PCR SCREENING     Status: None   Collection Time    03/05/14 10:28 PM      Result Value Ref Range   MRSA by PCR NEGATIVE  NEGATIVE  Comment:            The GeneXpert MRSA Assay (FDA     approved for NASAL specimens     only), is one component of a     comprehensive MRSA colonization     surveillance program. It is not     intended to diagnose MRSA     infection nor to guide or     monitor treatment for     MRSA infections.  SEDIMENTATION RATE     Status: Abnormal   Collection Time    03/05/14 11:08 PM      Result Value Ref Range   Sed Rate 40 (*) 0 - 22 mm/hr  TROPONIN I     Status: Abnormal   Collection Time    03/05/14 11:08 PM      Result Value Ref Range   Troponin I 5.50 (*) <0.30 ng/mL   Comment:            Due to the release kinetics of cTnI,     a negative result within the first hours     of the onset  of symptoms does not rule out     myocardial infarction with certainty.     If myocardial infarction is still suspected,     repeat the test at appropriate intervals.     CRITICAL RESULT CALLED TO, READ BACK BY AND VERIFIED WITH:     DILLON,D RN 03/06/2014 0010 JORDANS  LIPASE, BLOOD     Status: Abnormal   Collection Time    03/05/14 11:08 PM      Result Value Ref Range   Lipase 8 (*) 11 - 59 U/L  URIC ACID     Status: Abnormal   Collection Time    03/05/14 11:08 PM      Result Value Ref Range   Uric Acid, Serum 7.2 (*) 2.4 - 7.0 mg/dL  LACTIC ACID, PLASMA     Status: None   Collection Time    03/05/14 11:08 PM      Result Value Ref Range   Lactic Acid, Venous 1.5  0.5 - 2.2 mmol/L  WOUND CULTURE     Status: None   Collection Time    03/05/14 11:52 PM      Result Value Ref Range   Specimen Description WOUND RIGHT GROIN     Special Requests NONE     Gram Stain PENDING     Culture       Value: MODERATE PSEUDOMONAS AERUGINOSA     Performed at [facility]   Report Status PENDING    COMPREHENSIVE METABOLIC PANEL     Status: Abnormal   Collection Time    03/06/14  4:15 AM      Result Value Ref Range   Sodium 132 (*) 137 - 147 mEq/L   Potassium 3.4 (*) 3.7 - 5.3 mEq/L   Chloride 99  96 - 112 mEq/L   CO2 19  19 - 32 mEq/L   Glucose, Bld 175 (*) 70 - 99 mg/dL   BUN 10  6 - 23 mg/dL   Creatinine, Ser 0.47 (*) 0.50 - 1.10 mg/dL   Calcium 7.2 (*) 8.4 - 10.5 mg/dL   Total Protein 5.5 (*) 6.0 - 8.3 g/dL   Albumin 2.4 (*) 3.5 - 5.2 g/dL   AST 41 (*) 0 - 37 U/L   ALT 15  0 - 35 U/L   Alkaline Phosphatase 64  39 - 117 U/L  Total Bilirubin 0.6  0.3 - 1.2 mg/dL   GFR calc non Af Amer >90  >90 mL/min   GFR calc Af Amer >90  >90 mL/min   Comment: (NOTE)     The eGFR has been calculated using the CKD EPI equation.     This calculation has not been validated in all clinical situations.     eGFR's persistently <90 mL/min signify possible Chronic Kidney     Disease.   Anion  gap 14  5 - 15  CBC WITH DIFFERENTIAL     Status: Abnormal   Collection Time    03/06/14  4:15 AM      Result Value Ref Range   WBC 8.9  4.0 - 10.5 K/uL   RBC 3.79 (*) 3.87 - 5.11 MIL/uL   Hemoglobin 8.3 (*) 12.0 - 15.0 g/dL   Comment: DELTA CHECK NOTED     REPEATED TO VERIFY   HCT 26.7 (*) 36.0 - 46.0 %   MCV 70.4 (*) 78.0 - 100.0 fL   MCH 21.9 (*) 26.0 - 34.0 pg   MCHC 31.1  30.0 - 36.0 g/dL   RDW 18.5 (*) 11.5 - 15.5 %   Platelets 202  150 - 400 K/uL   Comment: DELTA CHECK NOTED     SPECIMEN CHECKED FOR CLOTS     REPEATED TO VERIFY   Neutrophils Relative % 88 (*) 43 - 77 %   Neutro Abs 7.8 (*) 1.7 - 7.7 K/uL   Lymphocytes Relative 5 (*) 12 - 46 %   Lymphs Abs 0.5 (*) 0.7 - 4.0 K/uL   Monocytes Relative 7  3 - 12 %   Monocytes Absolute 0.6  0.1 - 1.0 K/uL   Eosinophils Relative 0  0 - 5 %   Eosinophils Absolute 0.0  0.0 - 0.7 K/uL   Basophils Relative 0  0 - 1 %   Basophils Absolute 0.0  0.0 - 0.1 K/uL  LACTIC ACID, PLASMA     Status: None   Collection Time    03/06/14  4:15 AM      Result Value Ref Range   Lactic Acid, Venous 0.8  0.5 - 2.2 mmol/L  TROPONIN I     Status: Abnormal   Collection Time    03/06/14  4:15 AM      Result Value Ref Range   Troponin I 6.05 (*) <0.30 ng/mL   Comment:            Due to the release kinetics of cTnI,     a negative result within the first hours     of the onset of symptoms does not rule out     myocardial infarction with certainty.     If myocardial infarction is still suspected,     repeat the test at appropriate intervals.     CRITICAL VALUE NOTED.  VALUE IS CONSISTENT WITH PREVIOUSLY REPORTED AND CALLED VALUE.  BLOOD GAS, ARTERIAL     Status: Abnormal   Collection Time    03/06/14  4:40 AM      Result Value Ref Range   O2 Content 5.0     Delivery systems NASAL CANNULA     pH, Arterial 7.345 (*) 7.350 - 7.450   pCO2 arterial 38.9  35.0 - 45.0 mmHg   pO2, Arterial 50.8 (*) 80.0 - 100.0 mmHg   Bicarbonate 20.7  20.0 -  24.0 mEq/L   TCO2 21.9  0 - 100 mmol/L   Acid-base deficit 4.0 (*)  0.0 - 2.0 mmol/L   O2 Saturation 83.6     Patient temperature 98.6     Collection site RIGHT RADIAL     Drawn by [phone]     Sample type ARTERIAL DRAW     Allens test (pass/fail) PASS  PASS  CBC     Status: Abnormal   Collection Time    03/06/14  5:55 AM      Result Value Ref Range   WBC 7.3  4.0 - 10.5 K/uL   RBC 3.76 (*) 3.87 - 5.11 MIL/uL   Hemoglobin 8.3 (*) 12.0 - 15.0 g/dL   HCT 26.7 (*) 36.0 - 46.0 %   MCV 71.0 (*) 78.0 - 100.0 fL   MCH 22.1 (*) 26.0 - 34.0 pg   MCHC 31.1  30.0 - 36.0 g/dL   RDW 18.5 (*) 11.5 - 15.5 %   Platelets 191  150 - 400 K/uL  D-DIMER, QUANTITATIVE     Status: Abnormal   Collection Time    03/06/14  5:55 AM      Result Value Ref Range   D-Dimer, Quant 3.31 (*) 0.00 - 0.48 ug/mL-FEU   Comment:            AT THE INHOUSE ESTABLISHED CUTOFF     VALUE OF 0.48 ug/mL FEU,     THIS ASSAY HAS BEEN DOCUMENTED     IN THE LITERATURE TO HAVE     A SENSITIVITY AND NEGATIVE     PREDICTIVE VALUE OF AT LEAST     98 TO 99%.  THE TEST RESULT     SHOULD BE CORRELATED WITH     AN ASSESSMENT OF THE CLINICAL     PROBABILITY OF DVT / VTE.  CORTISOL     Status: None   Collection Time    03/06/14  7:18 AM      Result Value Ref Range   Cortisol, Plasma 63.3     Comment: (NOTE)     AM:  4.3 - 22.4 ug/dL     PM:  3.1 - 16.7 ug/dL     Performed at [facility]  PROCALCITONIN     Status: None   Collection Time    03/06/14  7:30 AM      Result Value Ref Range   Procalcitonin 0.62     Comment:            Interpretation:     PCT > 0.5 ng/mL and <= 2 ng/mL:     Systemic infection (sepsis) is possible,     but other conditions are known to elevate     PCT as well.     (NOTE)             ICU PCT Algorithm               Non ICU PCT Algorithm        ----------------------------     ------------------------------             PCT < 0.25 ng/mL                 PCT < 0.1 ng/mL         Stopping  of antibiotics            Stopping of antibiotics           strongly encouraged.               strongly encouraged.        ----------------------------     ------------------------------  PCT level decrease by               PCT < 0.25 ng/mL           >= 80% from peak PCT           OR PCT 0.25 - 0.5 ng/mL          Stopping of antibiotics                                                 encouraged.         Stopping of antibiotics               encouraged.        ----------------------------     ------------------------------           PCT level decrease by              PCT >= 0.25 ng/mL           < 80% from peak PCT            AND PCT >= 0.5 ng/mL            Continuing antibiotics                                                  encouraged.           Continuing antibiotics                encouraged.        ----------------------------     ------------------------------         PCT level increase compared          PCT > 0.5 ng/mL             with peak PCT AND              PCT >= 0.5 ng/mL             Escalation of antibiotics                                              strongly encouraged.          Escalation of antibiotics            strongly encouraged.  GLUCOSE, CAPILLARY     Status: Abnormal   Collection Time    03/06/14  7:36 AM      Result Value Ref Range   Glucose-Capillary 181 (*) 70 - 99 mg/dL   Comment 1 Notify RN     Comment 2 Documented in Chart    HEPARIN LEVEL (UNFRACTIONATED)     Status: Abnormal   Collection Time    03/06/14  8:00 AM      Result Value Ref Range   Heparin Unfractionated <0.10 (*) 0.30 - 0.70 IU/mL   Comment:            IF HEPARIN RESULTS ARE BELOW     EXPECTED VALUES, AND PATIENT     DOSAGE HAS BEEN CONFIRMED,     SUGGEST FOLLOW  UP TESTING     OF ANTITHROMBIN III LEVELS.     REPEATED TO VERIFY  CK     Status: Abnormal   Collection Time    03/06/14  8:00 AM      Result Value Ref Range   Total CK 806 (*) 7 - 177 U/L  INFLUENZA PANEL  BY PCR (TYPE A & B, H1N1)     Status: None   Collection Time    03/06/14  8:10 AM      Result Value Ref Range   Influenza A By PCR NEGATIVE  NEGATIVE   Influenza B By PCR NEGATIVE  NEGATIVE   H1N1 flu by pcr NOT DETECTED  NOT DETECTED   Comment:            The Xpert Flu assay (FDA approved for     nasal aspirates or washes and     nasopharyngeal swab specimens), is     intended as an aid in the diagnosis of     influenza and should not be used as     a sole basis for treatment.  STREP PNEUMONIAE URINARY ANTIGEN     Status: None   Collection Time    03/06/14  9:14 AM      Result Value Ref Range   Strep Pneumo Urinary Antigen NEGATIVE  NEGATIVE   Comment:            Infection due to S. pneumoniae     cannot be absolutely ruled out     since the antigen present     may be below the detection limit     of the test.  POCT I-STAT 3, ART BLOOD GAS (G3+)     Status: Abnormal   Collection Time    03/06/14 10:08 AM      Result Value Ref Range   pH, Arterial 7.049 (*) 7.350 - 7.450   pCO2 arterial 83.7 (*) 35.0 - 45.0 mmHg   pO2, Arterial 82.0  80.0 - 100.0 mmHg   Bicarbonate 22.9  20.0 - 24.0 mEq/L   TCO2 25  0 - 100 mmol/L   O2 Saturation 88.0     Acid-base deficit 9.0 (*) 0.0 - 2.0 mmol/L   Patient temperature 99.6 F     Collection site RADIAL, ALLEN'S TEST ACCEPTABLE     Drawn by RT     Sample type ARTERIAL     Comment NOTIFIED PHYSICIAN    TROPONIN I     Status: Abnormal   Collection Time    03/06/14 10:15 AM      Result Value Ref Range   Troponin I 10.57 (*) <0.30 ng/mL   Comment:            Due to the release kinetics of cTnI,     a negative result within the first hours     of the onset of symptoms does not rule out     myocardial infarction with certainty.     If myocardial infarction is still suspected,     repeat the test at appropriate intervals.     CRITICAL VALUE NOTED.  VALUE IS CONSISTENT WITH PREVIOUSLY REPORTED AND CALLED VALUE.  PRO B NATRIURETIC PEPTIDE      Status: Abnormal   Collection Time    03/06/14 10:15 AM      Result Value Ref Range   Pro B Natriuretic peptide (BNP) 8842.0 (*) 0 - 125 pg/mL  GLUCOSE, CAPILLARY     Status: Abnormal  Collection Time    03/06/14 11:08 AM      Result Value Ref Range   Glucose-Capillary 221 (*) 70 - 99 mg/dL  POCT I-STAT 3, ART BLOOD GAS (G3+)     Status: Abnormal   Collection Time    03/06/14 12:51 PM      Result Value Ref Range   pH, Arterial 7.174 (*) 7.350 - 7.450   pCO2 arterial 57.8 (*) 35.0 - 45.0 mmHg   pO2, Arterial 96.0  80.0 - 100.0 mmHg   Bicarbonate 21.1  20.0 - 24.0 mEq/L   TCO2 23  0 - 100 mmol/L   O2 Saturation 94.0     Acid-base deficit 8.0 (*) 0.0 - 2.0 mmol/L   Patient temperature 100.0 F     Collection site RADIAL, ALLEN'S TEST ACCEPTABLE     Drawn by RT     Sample type ARTERIAL     Comment NOTIFIED PHYSICIAN    CBC     Status: Abnormal   Collection Time    03/06/14  1:00 PM      Result Value Ref Range   WBC 7.8  4.0 - 10.5 K/uL   RBC 3.89  3.87 - 5.11 MIL/uL   Hemoglobin 8.6 (*) 12.0 - 15.0 g/dL   HCT 27.9 (*) 36.0 - 46.0 %   MCV 71.7 (*) 78.0 - 100.0 fL   MCH 22.1 (*) 26.0 - 34.0 pg   MCHC 30.8  30.0 - 36.0 g/dL   RDW 18.5 (*) 11.5 - 15.5 %   Platelets 194  150 - 400 K/uL  PROTIME-INR     Status: Abnormal   Collection Time    03/06/14  1:00 PM      Result Value Ref Range   Prothrombin Time 17.6 (*) 11.6 - 15.2 seconds   INR 1.45  0.00 - 1.49  LACTIC ACID, PLASMA     Status: None   Collection Time    03/06/14  2:00 PM      Result Value Ref Range   Lactic Acid, Venous 1.1  0.5 - 2.2 mmol/L  CARBOXYHEMOGLOBIN     Status: Abnormal   Collection Time    03/06/14  3:24 PM      Result Value Ref Range   Total hemoglobin 9.1 (*) 12.0 - 16.0 g/dL   O2 Saturation 68.9     Carboxyhemoglobin 1.7 (*) 0.5 - 1.5 %   Methemoglobin 0.4  0.0 - 1.5 %  GLUCOSE, CAPILLARY     Status: Abnormal   Collection Time    03/06/14  3:39 PM      Result Value Ref Range    Glucose-Capillary 174 (*) 70 - 99 mg/dL   Comment 1 Notify RN    POCT I-STAT 3, ART BLOOD GAS (G3+)     Status: Abnormal   Collection Time    03/06/14  4:46 PM      Result Value Ref Range   pH, Arterial 7.300 (*) 7.350 - 7.450   pCO2 arterial 41.5  35.0 - 45.0 mmHg   pO2, Arterial 80.0  80.0 - 100.0 mmHg   Bicarbonate 20.3  20.0 - 24.0 mEq/L   TCO2 22  0 - 100 mmol/L   O2 Saturation 94.0     Acid-base deficit 6.0 (*) 0.0 - 2.0 mmol/L   Patient temperature 99.2 F     Collection site RADIAL, ALLEN'S TEST ACCEPTABLE     Drawn by RT     Sample type ARTERIAL    TROPONIN  I     Status: Abnormal   Collection Time    03/06/14  5:18 PM      Result Value Ref Range   Troponin I 16.99 (*) <0.30 ng/mL   Comment:            Due to the release kinetics of cTnI,     a negative result within the first hours     of the onset of symptoms does not rule out     myocardial infarction with certainty.     If myocardial infarction is still suspected,     repeat the test at appropriate intervals.     CRITICAL VALUE NOTED.  VALUE IS CONSISTENT WITH PREVIOUSLY REPORTED AND CALLED VALUE.  CBC     Status: Abnormal   Collection Time    03/06/14  6:00 PM      Result Value Ref Range   WBC 7.8  4.0 - 10.5 K/uL   RBC 3.97  3.87 - 5.11 MIL/uL   Hemoglobin 8.9 (*) 12.0 - 15.0 g/dL   HCT 28.5 (*) 36.0 - 46.0 %   MCV 71.8 (*) 78.0 - 100.0 fL   MCH 22.4 (*) 26.0 - 34.0 pg   MCHC 31.2  30.0 - 36.0 g/dL   RDW 18.4 (*) 11.5 - 15.5 %   Platelets 180  150 - 400 K/uL  HEPARIN LEVEL (UNFRACTIONATED)     Status: Abnormal   Collection Time    03/06/14  6:30 PM      Result Value Ref Range   Heparin Unfractionated <0.10 (*) 0.30 - 0.70 IU/mL   Comment:            IF HEPARIN RESULTS ARE BELOW     EXPECTED VALUES, AND PATIENT     DOSAGE HAS BEEN CONFIRMED,     SUGGEST FOLLOW UP TESTING     OF ANTITHROMBIN III LEVELS.  PROCALCITONIN     Status: None   Collection Time    03/06/14  7:38 PM      Result Value Ref  Range   Procalcitonin 1.59     Comment:            Interpretation:     PCT > 0.5 ng/mL and <= 2 ng/mL:     Systemic infection (sepsis) is possible,     but other conditions are known to elevate     PCT as well.     (NOTE)             ICU PCT Algorithm               Non ICU PCT Algorithm        ----------------------------     ------------------------------             PCT < 0.25 ng/mL                 PCT < 0.1 ng/mL         Stopping of antibiotics            Stopping of antibiotics           strongly encouraged.               strongly encouraged.        ----------------------------     ------------------------------           PCT level decrease by               PCT < 0.25 ng/mL           >=  80% from peak PCT           OR PCT 0.25 - 0.5 ng/mL          Stopping of antibiotics                                                 encouraged.         Stopping of antibiotics               encouraged.        ----------------------------     ------------------------------           PCT level decrease by              PCT >= 0.25 ng/mL           < 80% from peak PCT            AND PCT >= 0.5 ng/mL            Continuing antibiotics                                                  encouraged.           Continuing antibiotics                encouraged.        ----------------------------     ------------------------------         PCT level increase compared          PCT > 0.5 ng/mL             with peak PCT AND              PCT >= 0.5 ng/mL             Escalation of antibiotics                                              strongly encouraged.          Escalation of antibiotics            strongly encouraged.  GLUCOSE, CAPILLARY     Status: Abnormal   Collection Time    03/06/14  8:44 PM      Result Value Ref Range   Glucose-Capillary 224 (*) 70 - 99 mg/dL  GLUCOSE, CAPILLARY     Status: Abnormal   Collection Time    03/07/14 12:12 AM      Result Value Ref Range   Glucose-Capillary 278 (*) 70  - 99 mg/dL   Comment 1 Documented in Chart     Comment 2 Notify RN    GLUCOSE, CAPILLARY     Status: Abnormal   Collection Time    03/07/14  4:08 AM      Result Value Ref Range   Glucose-Capillary 268 (*) 70 - 99 mg/dL   Comment 1 Documented in Chart     Comment 2 Notify RN    HEPARIN LEVEL (UNFRACTIONATED)     Status: Abnormal   Collection Time    03/07/14  5:00 AM  Result Value Ref Range   Heparin Unfractionated <0.10 (*) 0.30 - 0.70 IU/mL   Comment:            IF HEPARIN RESULTS ARE BELOW     EXPECTED VALUES, AND PATIENT     DOSAGE HAS BEEN CONFIRMED,     SUGGEST FOLLOW UP TESTING     OF ANTITHROMBIN III LEVELS.  CBC     Status: Abnormal   Collection Time    03/07/14  5:00 AM      Result Value Ref Range   WBC 17.2 (*) 4.0 - 10.5 K/uL   RBC 4.13  3.87 - 5.11 MIL/uL   Hemoglobin 9.1 (*) 12.0 - 15.0 g/dL   HCT 29.7 (*) 36.0 - 46.0 %   MCV 71.9 (*) 78.0 - 100.0 fL   MCH 22.0 (*) 26.0 - 34.0 pg   MCHC 30.6  30.0 - 36.0 g/dL   RDW 18.8 (*) 11.5 - 15.5 %   Platelets 334  150 - 400 K/uL   Comment: DELTA CHECK NOTED     REPEATED TO VERIFY     SPECIMEN CHECKED FOR CLOTS  MAGNESIUM     Status: None   Collection Time    03/07/14  5:00 AM      Result Value Ref Range   Magnesium 1.8  1.5 - 2.5 mg/dL  PHOSPHORUS     Status: Abnormal   Collection Time    03/07/14  5:00 AM      Result Value Ref Range   Phosphorus 1.4 (*) 2.3 - 4.6 mg/dL  PROCALCITONIN     Status: None   Collection Time    03/07/14  5:00 AM      Result Value Ref Range   Procalcitonin 1.34     Comment:            Interpretation:     PCT > 0.5 ng/mL and <= 2 ng/mL:     Systemic infection (sepsis) is possible,     but other conditions are known to elevate     PCT as well.     (NOTE)             ICU PCT Algorithm               Non ICU PCT Algorithm        ----------------------------     ------------------------------             PCT < 0.25 ng/mL                 PCT < 0.1 ng/mL         Stopping of  antibiotics            Stopping of antibiotics           strongly encouraged.               strongly encouraged.        ----------------------------     ------------------------------           PCT level decrease by               PCT < 0.25 ng/mL           >= 80% from peak PCT           OR PCT 0.25 - 0.5 ng/mL          Stopping of antibiotics  encouraged.         Stopping of antibiotics               encouraged.        ----------------------------     ------------------------------           PCT level decrease by              PCT >= 0.25 ng/mL           < 80% from peak PCT            AND PCT >= 0.5 ng/mL            Continuing antibiotics                                                  encouraged.           Continuing antibiotics                encouraged.        ----------------------------     ------------------------------         PCT level increase compared          PCT > 0.5 ng/mL             with peak PCT AND              PCT >= 0.5 ng/mL             Escalation of antibiotics                                              strongly encouraged.          Escalation of antibiotics            strongly encouraged.  GLUCOSE, CAPILLARY     Status: Abnormal   Collection Time    03/07/14  7:51 AM      Result Value Ref Range   Glucose-Capillary 211 (*) 70 - 99 mg/dL  POCT I-STAT 3, ART BLOOD GAS (G3+)     Status: Abnormal   Collection Time    03/07/14  9:36 AM      Result Value Ref Range   pH, Arterial 7.313 (*) 7.350 - 7.450   pCO2 arterial 40.3  35.0 - 45.0 mmHg   pO2, Arterial 76.0 (*) 80.0 - 100.0 mmHg   Bicarbonate 20.5  20.0 - 24.0 mEq/L   TCO2 22  0 - 100 mmol/L   O2 Saturation 94.0     Acid-base deficit 5.0 (*) 0.0 - 2.0 mmol/L   Patient temperature 98.1 F     Collection site RADIAL, ALLEN'S TEST ACCEPTABLE     Drawn by RT     Sample type ARTERIAL        Component Value Date/Time   SDES WOUND RIGHT GROIN 03/05/2014 2352    SPECREQUEST NONE 03/05/2014 2352   CULT  Value: MODERATE PSEUDOMONAS AERUGINOSA Performed at [facility] 03/05/2014 2352   REPTSTATUS PENDING 03/05/2014 2352   Dg Tibia/fibula Left  03/05/2014   CLINICAL DATA:  Lower leg pain.  EXAM: LEFT TIBIA AND FIBULA - 2 VIEW  COMPARISON:  Screws in the distal tibia and side plate and screws  in the distal fibula. The fractures have healed. 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 No acute osseous abnormality. Extensive vascular calcifications and dermal calcifications in the lower leg.  FINDINGS: No acute abnormalities. Arthritis at the knee and ankle. Postsurgical changes.  IMPRESSION: Negative.   Electronically Signed   By: Rozetta Nunnery M.D.   On: 03/05/2014 22:10   Ct Abdomen Pelvis W Contrast  03/06/2014   CLINICAL DATA:  Left flank pain began after recent vascular catheterization. Pain in the entire left leg. No fever, chills, nausea, vomiting.  EXAM: CT ABDOMEN AND PELVIS WITH CONTRAST  TECHNIQUE: Multidetector CT imaging of the abdomen and pelvis was performed using the standard protocol following bolus administration of intravenous contrast.  CONTRAST:  127mL OMNIPAQUE IOHEXOL 300 MG/ML  SOLN  COMPARISON:  07/25/2013  FINDINGS: Lower chest: Dependent changes are seen in the lung bases. Scattered emphysematous changes are also present. There is atherosclerotic calcification of the coronary vessels. Heart size is normal. No pericardial effusion.  Upper abdomen: Scattered calcified granulomata are identified within the liver. No focal abnormality identified within the spleen, pancreas, or left adrenal gland. There is a low-attenuation lesion adjacent to or involving the right adrenal gland, consistent with a benign process. The patient has had previous cholecystectomy. Bilateral renal cysts are noted.  Bowel: Stomach has a normal appearance. A midline abdominal hernia contains  mesenteric fat and nondilated loops of small bowel and appears stable over prior studies. Colonic loops are normal in appearance. There is moderate stool burden. The appendix has a normal appearance.  Pelvis: There is moderate stranding in the subcutaneous tissues and abdominal wall of the left inguinal region. Findings are consistent with edema/ hematoma after recent catheterization. Age no evidence for intraperitoneal hematoma. The uterus is present. No adnexal mass. The urinary bladder is distended.  Retroperitoneum: No retroperitoneal or mesenteric adenopathy. There is dense atherosclerotic calcification of the abdominal aorta. No aneurysm. Right iliac stent.  Osseous structures: Degenerative changes in the lower thoracic and upper lumbar spine. There is 4 mm of anterolisthesis of L3 on L4.  IMPRESSION: 1. Left inguinal hematoma. This does not extend into the peritoneal cavity. 2. Midline abdominal hernia contains mesenteric fat and nondilated loops of small bowel. 3. Distended urinary bladder. 4. Normal appendix. 5. Scattered emphysematous changes. 6. Coronary artery calcifications. 7. Calcified granulomata in the liver. 8. Benign right adrenal gland lesion.   Electronically Signed   By: Shon Hale M.D.   On: 03/06/2014 01:07   Portable Chest Xray In Am  03/07/2014   CLINICAL DATA:  Hypoxia  EXAM: PORTABLE CHEST - 1 VIEW  COMPARISON:  March 06, 2014  FINDINGS: Endotracheal tube tip is 2.5 cm above the carina. Central catheter tip is at the junction of the left innominate vein and superior vena cava. Nasogastric tube tip and side port are below the diaphragm. No pneumothorax. There is interstitial edema with cardiomegaly and pulmonary venous hypertension. There is a right effusion. There is a minimal left effusion as well. There is mild airspace consolidation in the left base. No adenopathy.  IMPRESSION: Tube and catheter positions as described without pneumothorax. Evidence of a degree of congestive  heart failure. Question alveolar edema versus superimposed pneumonia left base.   Electronically Signed   By: Lowella Grip M.D.   On: 03/07/2014 07:23   Dg Chest Port 1 View  03/06/2014   CLINICAL DATA:  Endotracheal tube and  central line placement.  EXAM: PORTABLE CHEST - 1 VIEW  COMPARISON:  Same day.  FINDINGS: Endotracheal tube is in grossly good position with distal tip 4 cm above the carina. Left internal jugular catheter line is noted with distal tip in expected position of the SVC. No pneumothorax is noted. Stable bilateral perihilar and basilar opacities are noted concerning for edema or pneumonia.  IMPRESSION: No pneumothorax status post left internal jugular catheter line placement. Endotracheal tube in grossly good position. These results will be called to the ordering clinician or representative by the Radiologist Assistant, and communication documented in the PACS or zVision Dashboard.   Electronically Signed   By: Sabino Dick M.D.   On: 03/06/2014 13:02   Dg Chest Port 1 View  03/06/2014   CLINICAL DATA:  Shortness of breath.  EXAM: PORTABLE CHEST - 1 VIEW  COMPARISON:  Chest radiograph performed 03/05/2014  FINDINGS: The lungs are well-aerated. Right basilar airspace opacity may reflect pneumonia or mild pulmonary edema. Underlying vascular congestion is noted. A small right pleural effusion is suspected. No pneumothorax is seen.  The cardiomediastinal silhouette is borderline enlarged. No acute osseous abnormalities are seen.  IMPRESSION: 1. Right basilar airspace opacity may reflect pneumonia or mild pulmonary edema. Small right pleural effusion suspected. 2. Underlying vascular congestion and borderline cardiomegaly.   Electronically Signed   By: Garald Balding M.D.   On: 03/06/2014 06:48   Dg Chest Portable 1 View  03/05/2014   CLINICAL DATA:  Leg pain.  Smoker.  EXAM: PORTABLE CHEST - 1 VIEW  COMPARISON:  Chest radiograph 09/25/2013  FINDINGS: Cardiac leads project over the chest.  Cardiomediastinal silhouette is stable. There is atherosclerotic calcification in the thoracic aortic arch. Lung volumes are low resulting in crowding of pulmonary vascular markings. No airspace disease or pulmonary edema is identified. No acute bony abnormality appreciated.  IMPRESSION: Low lung volumes.  No acute findings identified.  Atherosclerosis of the thoracic aorta.   Electronically Signed   By: Curlene Dolphin M.D.   On: 03/05/2014 19:15   Dg Abd Portable 1v  03/06/2014   CLINICAL DATA:  NG tube placement.  EXAM: PORTABLE ABDOMEN - 1 VIEW  COMPARISON:  03/06/2014  FINDINGS: The endotracheal tube is 2.7 cm above the carina. The left IJ catheter tip is in the mid SVC. The NG tube tip is in the stomach. Persistent edema and right lower lobe airspace process.  IMPRESSION: The NG tube is in the stomach.  ETT is 2.7 cm above the carina.  Left IJ catheter tip is in the mid SVC.   Electronically Signed   By: Kalman Jewels M.D.   On: 03/06/2014 15:43   Dg Foot Complete Left  03/05/2014   CLINICAL DATA:  Left foot pain. Soft tissue ulceration. Redness and discoloration.  EXAM: LEFT FOOT - COMPLETE 3+ VIEW  COMPARISON:  08/10/2012  FINDINGS: There is no fracture or dislocation or bone destruction suggestive of osteomyelitis. There are 2 screws in the distal tibia and a side plate and multiple screws in the distal fibula. 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 Small plantar calcaneal spur.  IMPRESSION: No acute abnormalities.  No change since the prior exam.   Electronically Signed   By: Rozetta Nunnery M.D.   On: 03/05/2014 22:09   Recent Results (from the past 240 hour(s))  URINE CULTURE     Status: None   Collection Time    03/05/14  5:05 PM  Result Value Ref Range Status   Specimen Description URINE, RANDOM   Final   Special Requests NONE   Final   Culture  Setup Time     Final   Value: 03/05/2014 18:00     Performed at [facility] Count     Final   Value: NO  GROWTH     Performed at [facility]   Culture     Final   Value: NO GROWTH     Performed at [facility]   Report Status 03/07/2014 FINAL   Final  CULTURE, BLOOD (ROUTINE X 2)     Status: None   Collection Time    03/05/14  5:12 PM      Result Value Ref Range Status   Specimen Description BLOOD RIGHT ARM   Final   Special Requests BOTTLES DRAWN AEROBIC AND ANAEROBIC [place]   Final   Culture  Setup Time     Final   Value: 03/06/2014 00:49     Performed at [facility]   Culture     Final   Value: STAPHYLOCOCCUS AUREUS     Note: RIFAMPIN AND GENTAMICIN SHOULD NOT BE USED AS SINGLE DRUGS FOR TREATMENT OF STAPH INFECTIONS.     Note: Gram Stain Report Called to,Read Back By and Verified With: THERESA@1 :30PM ON 03/06/14 BY DANTS     Performed at [facility]   Report Status PENDING   Incomplete  CULTURE, BLOOD (ROUTINE X 2)     Status: None   Collection Time    03/05/14  6:05 PM      Result Value Ref Range Status   Specimen Description BLOOD RIGHT FOREARM   Final   Special Requests BOTTLES DRAWN AEROBIC AND ANAEROBIC 10CC EA   Final   Culture  Setup Time     Final   Value: 03/06/2014 00:49     Performed at [facility]   Culture     Final   Value: STAPHYLOCOCCUS AUREUS     Note: Gram Stain Report Called to,Read Back By and Verified With: THERESA@1 :30PM ON 03/06/14 BY DANTS     Performed at [facility]   Report Status PENDING   Incomplete  MRSA PCR SCREENING     Status: None   Collection Time    03/05/14 10:28 PM      Result Value Ref Range Status   MRSA by PCR NEGATIVE  NEGATIVE Final   Comment:            The GeneXpert MRSA Assay (FDA     approved for NASAL specimens     only), is one component of a     comprehensive MRSA colonization     surveillance program. It is not     intended to diagnose MRSA     infection nor to guide or     monitor treatment for     MRSA infections.  WOUND CULTURE     Status: None   Collection Time     03/05/14 11:52 PM      Result Value Ref Range Status   Specimen Description WOUND RIGHT GROIN   Final   Special Requests NONE   Final   Gram Stain PENDING   Incomplete   Culture     Final   Value: MODERATE PSEUDOMONAS AERUGINOSA     Performed at [facility]   Report Status PENDING   Incomplete  03/07/2014, 10:35 AM     LOS: 2 days        [place] Antimicrobial Management Team Staphylococcus aureus bacteremia   Staphylococcus aureus bacteremia (SAB) is associated with a high rate of complications and mortality.  Specific aspects of clinical management are critical to optimizing the outcome of patients with SAB.  Therefore, the [facility] Antimicrobial Management Team [facility]) has initiated an intervention aimed at improving the management of SAB at [facility].  To do so, Infectious Diseases physicians are providing an evidence-based consult for the management of all patients with SAB.     Yes No Comments  Perform follow-up blood cultures (even if the patient is afebrile) to ensure clearance of bacteremia []  []    Remove vascular catheter and obtain follow-up blood cultures after the removal of the catheter []  []    Perform echocardiography to evaluate for endocarditis (transthoracic ECHO is 40-50% sensitive, TEE is > 90% sensitive) [x]  []  Please keep in mind, that neither test can definitively EXCLUDE endocarditis, and that should clinical suspicion remain high for endocarditis the patient should then still be treated with an "endocarditis" duration of therapy = 6 weeks  Consult electrophysiologist to evaluate implanted cardiac device (pacemaker, ICD) []  []    Ensure source control [x]  []  Have all abscesses been drained effectively? Have deep seeded infections (septic joints or osteomyelitis) had appropriate surgical debridement?  Investigate for "metastatic" sites of infection []  []  Does the patient have ANY symptom or physical exam finding that would suggest a deeper  infection (back or neck pain that may be suggestive of vertebral osteomyelitis or epidural abscess, muscle pain that could be a symptom of pyomyositis)?  Keep in mind that for deep seeded infections MRI imaging with contrast is preferred rather than other often insensitive tests such as plain x-rays, especially early in a patient's presentation.  Change antibiotic therapy to __________________ []  []  Beta-lactam antibiotics are preferred for MSSA due to higher cure rates.   If on Vancomycin, goal trough should be 15 - 20 mcg/mL  Estimated duration of IV antibiotic therapy:   []  []  Consult case management for probably prolonged outpatient IV antibiotic therapy

## 2014-03-07 NOTE — Progress Notes (Addendum)
INITIAL NUTRITION ASSESSMENT  DOCUMENTATION CODES Per approved criteria  -Morbid Obesity   INTERVENTION:  Utilize 69M PEPuP Protocol: initiate TF via OGT with Vital High Protein at 25 ml/h and Prostat 30 ml BID on day 1; on day 2, discontinue Prostat and increase to goal rate of 55 ml/h (1320 ml per day) to provide 1320 kcals (70% of estimated nutrition needs), 116 gm protein, 1104 ml free water daily.  NUTRITION DIAGNOSIS: Inadequate oral intake related to inability to eat as evidenced by NPO status.   Goal: Enteral nutrition to provide 60-70% of estimated calorie needs (22-25 kcals/kg ideal body weight) and 100% of estimated protein needs, based on ASPEN guidelines for permissive underfeeding in critically ill obese individuals  Monitor:  TF tolerance/adequacy, weight trend, labs, vent status.  Reason for Assessment: MD Consult for TF initiation and management.  58 y.o. female  Admitting Dx: Sepsis  ASSESSMENT: 57 yo smoker, PVD, CAD, recent (9-10) failed left femoral attempted stenosis angioplasty who presented with left leg pain 9/13 and found to be hypotensive, hypoxic and + troponin. She has a rt fem open wound that has been healng x 6 weeks and is followed by [facility]. Ct abd reveals large left femoral hematoma.  Patient's sister in room with patient reports that patient has lost ~45 lbs over the past 6 months due to GI distress (severe heartburn, poor appetite, minimal oral intake). She was on antibiotics for ~2 months and sister thinks these caused GI distress. Nutrition focused physical exam completed.  No muscle or subcutaneous fat depletion noticed.  Patient is currently intubated on ventilator support MV: 9.5 L/min Temp (24hrs), Avg:98.8 F (37.1 C), Min:98 F (36.7 C), Max:100 F (37.8 C)  Propofol: none   Height: Ht Readings from Last 1 Encounters:  03/05/14  (1.575 m)    Weight: Wt Readings from Last 1 Encounters:  03/07/14 220 lb 14.4 oz (100.2 kg)     Ideal Body Weight: 50 kg  % Ideal Body Weight: 200%  Wt Readings from Last 10 Encounters:  03/07/14 220 lb 14.4 oz (100.2 kg)  03/01/14 196 lb (88.905 kg)  03/01/14 196 lb (88.905 kg)  02/06/14 197 lb 4.8 oz (89.495 kg)  01/16/14 202 lb (91.627 kg)  12/05/13 208 lb 12.8 oz (94.711 kg)  11/22/13 208 lb (94.348 kg)  10/24/13 222 lb (100.699 kg)  10/12/13 226 lb (102.513 kg)  09/27/13 237 lb 3.4 oz (107.6 kg)    Usual Body Weight: 237 lb 5 months ago  % Usual Body Weight: 93%  BMI:  Body mass index is 40.39 kg/(m^2). class 3, extreme/morbid obesity  Estimated Nutritional Needs: Kcal: 1882 Protein: 100-125 gm Fluid: 1.9 L  Skin: diabetic ulcers to toes, feet, ankles; open groin wound  Diet Order: NPO  EDUCATION NEEDS: -Education not appropriate at this time   Intake/Output Summary (Last 24 hours) at 03/07/14 0933 Last data filed at 03/07/14 0700  Gross per 24 hour  Intake 3943.09 ml  Output    600 ml  Net 3343.09 ml    Last BM: PTA   Labs:   Recent Labs Lab 03/05/14 1225 03/05/14 1650 03/06/14 0415 03/07/14 0500  NA 130* 130* 132*  --   K 3.7 3.8 3.4*  --   CL 93* 92* 99  --   CO2  --  25 19  --   BUN --   CREATININE 0.80 0.60 0.47*  --   CALCIUM  --  8.8 7.2*  --  MG  --   --   --  1.8  PHOS  --   --   --  1.4*  GLUCOSE 201* 175* 175*  --     CBG (last 3)   Recent Labs  03/07/14 0012 03/07/14 0408 03/07/14 0751  GLUCAP 278* 268* 211*    Scheduled Meds: . antiseptic oral rinse  7 mL Mouth Rinse QID  . aspirin  81 mg Per Tube Daily  . atorvastatin  40 mg Per Tube q1800  .  ceFAZolin (ANCEF) IV  2 g Intravenous 3 times per day  . chlorhexidine  15 mL Mouth Rinse BID  . darifenacin  7.5 mg Oral Daily  . escitalopram  10 mg Per Tube Daily  . famotidine (PEPCID) IV  20 mg Intravenous Q12H  . feeding supplement (PRO-STAT SUGAR FREE 64)  30 mL Per Tube BID  . feeding supplement (VITAL HIGH PROTEIN)  1,000 mL Per Tube Q24H   . insulin aspart  0-15 Units Subcutaneous 6 times per day  . ipratropium-albuterol  3 mL Nebulization Q6H  . magnesium sulfate 1 - 4 g bolus IVPB  2 g Intravenous Once  . methylPREDNISolone (SOLU-MEDROL) injection  40 mg Intravenous Q12H  . sodium phosphate  Dextrose 5% IVPB  30 mmol Intravenous Once  . vancomycin  1,000 mg Intravenous Q8H    Continuous Infusions: . fentaNYL infusion INTRAVENOUS 200 mcg/hr (03/07/14 0038)  . heparin 1,600 Units/hr (03/07/14 9604)  . phenylephrine (NEO-SYNEPHRINE) Adult infusion 50 mcg/min (03/07/14 0640)    Past Medical History  Diagnosis Date  . Diabetes mellitus   . Peripheral vascular disease   . Hyperlipidemia   . Obesity   . Hypertension   . Arthritis   . Ulcer of ankle     right  . Coronary artery disease   . COPD (chronic obstructive pulmonary disease)     Past Surgical History  Procedure Laterality Date  . Cholecystectomy    . Femoral-tibial bypass graft Right 09/27/2013    Procedure: BYPASS GRAFT FEMORAL-TIBIAL ARTERY-RIGHT;  Surgeon: Nada Libman, MD;  Location: [facility] OR;  Service: Vascular;  Laterality: Right;  . Abdominal aorta stent Right   . Bypass graft popliteal to tibial Right   . Coronary angioplasty with stent placement      proximcal RCA stent ~ 2001  . Amputation Right 10/14/2013    Procedure: AMPUTATION RAY;  Surgeon: Nadara Mustard, MD;  Location: [facility] OR;  Service: Orthopedics;  Laterality: Right;  Right 2nd Ray Amputation, [place] Graft Right Ankle    Joaquin Courts, RD, LDN, CNSC Pager [phone] After Hours Pager [phone]

## 2014-03-07 NOTE — Progress Notes (Addendum)
Patient Name: [name] Date of Encounter: 03/07/2014     Principal Problem:   Sepsis Active Problems:   Left leg pain   Anemia   Diabetes mellitus, controlled    SUBJECTIVE  Intubated, however alert and follow command. Denies any CP or SOB, no GI symptom, some LLE pain  CURRENT MEDS . antiseptic oral rinse  7 mL Mouth Rinse QID  . aspirin  81 mg Per Tube Daily  . atorvastatin  40 mg Per Tube q1800  .  ceFAZolin (ANCEF) IV  2 g Intravenous 3 times per day  . chlorhexidine  15 mL Mouth Rinse BID  . darifenacin  7.5 mg Oral Daily  . escitalopram  10 mg Per Tube Daily  . famotidine (PEPCID) IV  20 mg Intravenous Q12H  . feeding supplement (PRO-STAT SUGAR FREE 64)  30 mL Per Tube BID  . feeding supplement (VITAL HIGH PROTEIN)  1,000 mL Per Tube Q24H  . insulin aspart  0-15 Units Subcutaneous 6 times per day  . ipratropium-albuterol  3 mL Nebulization Q6H  . methylPREDNISolone (SOLU-MEDROL) injection  40 mg Intravenous Q12H  . sodium phosphate  Dextrose 5% IVPB  30 mmol Intravenous Once  . vancomycin  1,000 mg Intravenous Q8H    OBJECTIVE  Filed Vitals:   03/07/14 0600 03/07/14 0700 03/07/14 0917 03/07/14 0918  BP: 96/58 94/62 104/67   Pulse: 62 68 84   Temp:  98.1 F (36.7 C)    TempSrc:  Oral    Resp: Height:      Weight:      SpO2: 100% 98% 96% 95%    Intake/Output Summary (Last 24 hours) at 03/07/14 1058 Last data filed at 03/07/14 0700  Gross per 24 hour  Intake 3402.26 ml  Output    600 ml  Net 2802.26 ml   Filed Weights   03/05/14 2225 03/06/14 0500 03/07/14 0500  Weight: 206 lb 9.1 oz (93.7 kg) 213 lb 10 oz (96.9 kg) 220 lb 14.4 oz (100.2 kg)    PHYSICAL EXAM  General: NAD, intubated, however calm and able to follow command Neuro: Alert. Moves all extremities spontaneously. Psych: Normal affect. HEENT:  Normal  Neck: Supple without bruits or JVD. Lungs:  Resp regular and unlabored, anterior exam CTA with mild expiratory  wheezing Heart: RRR no s3, s4, or murmurs. Abdomen: Soft, non-tender, non-distended, BS + x 4.  Extremities: No clubbing, cyanosis. Bilateral lower extremity cold, minimal pulse with arterial ulcer  Accessory Clinical Findings  CBC  Recent Labs  03/05/14 1210  03/06/14 0415  03/06/14 1800 03/07/14 0500  WBC 17.1*  --  8.9  < > 7.8 17.2*  NEUTROABS 15.3*  --  7.8*  --   --   --   HGB 11.5*  < > 8.3*  < > 8.9* 9.1*  HCT 36.3  < > 26.7*  < > 28.5* 29.7*  MCV 70.5*  --  70.4*  < > 71.8* 71.9*  PLT 305  --  202  < > 180 334  < > = values in this interval not displayed. Basic Metabolic Panel  Recent Labs  03/05/14 1650 03/06/14 0415 03/07/14 0500  NA 130* 132*  --   K 3.8 3.4*  --   CL 92* 99  --   CO2 25 19  --   GLUCOSE 175* 175*  --   BUN 13 10  --   CREATININE 0.60 0.47*  --   CALCIUM  8.8 7.2*  --   MG  --   --  1.8  PHOS  --   --  1.4*   Liver Function Tests  Recent Labs  03/05/14 1650 03/06/14 0415  AST 31 41*  ALT 11 15  ALKPHOS 78 64  BILITOT 0.7 0.6  PROT 6.6 5.5*  ALBUMIN 3.0* 2.4*    Recent Labs  03/05/14 2308  LIPASE 8*   Cardiac Enzymes  Recent Labs  03/06/14 0415 03/06/14 0800 03/06/14 1015 03/06/14 1718  CKTOTAL  --  806*  --   --   TROPONINI 6.05*  --  10.57* 16.99*   D-Dimer  Recent Labs  03/06/14 0555  DDIMER 3.31*    TELE NSR with HR 60-100s, no significant ventricular ectopy    ECG  9/13 NSR with HR 120s, nonspecific ST changes  Echocardiogram 03/06/2014  LV EF: 30% - 35%  ------------------------------------------------------------------- History: PMH: Sepsis, Left Leg Pain, Anemia, Stent. Cardiogenic Shock. Fever. Coronary artery disease. Chronic obstructive pulmonary disease. Risk factors: Current tobacco use. Hypertension. Diabetes mellitus. Dyslipidemia.  ------------------------------------------------------------------- Study Conclusions  - Procedure narrative: Transthoracic echocardiography.  Technically difficult study with reduced echo windows. An image enhancement agent was administered. - Left ventricle: The cavity size was normal. Wall thickness was normal. Systolic function was moderately to severely reduced. The estimated ejection fraction was in the range of 30% to 35%. There is anterior, anteroseptal apical and inferoapical hypokinesis - consistent with possible LAD territory ischemia/infarct or possible Takatsubo cardiomyopathy. There is relatively preserved contraction of the base of the heart. LV apical false tendon is noted, but no mural thrombus is identified. Doppler parameters are consistent with abnormal left ventricular relaxation (grade 1 diastolic dysfunction). The E/e&' ratio is >15, suggesting elevated LV Filling pressure. - Aortic valve: Mildly calcified leaflets. There was no stenosis. There was no significant regurgitation. - Mitral valve: Calcified annulus. There was mild regurgitation.  Impressions:  - LVEF 30-35% with primarily LAD territory wall motion artifact, however, hyperdynamic base - consider Takatsubo. No mural thrombus noted. Calcific aortic and mitral valve disease, poor echo windows otherwise.      Radiology/Studies  Dg Tibia/fibula Left  03/05/2014   CLINICAL DATA:  Lower leg pain.  EXAM: LEFT TIBIA AND FIBULA - 2 VIEW  COMPARISON:  Screws in the distal tibia and side plate and screws in the distal fibula. The fractures have healed. 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 No acute osseous abnormality. Extensive vascular calcifications and dermal calcifications in the lower leg.  FINDINGS: No acute abnormalities. Arthritis at the knee and ankle. Postsurgical changes.  IMPRESSION: Negative.   Electronically Signed   By: Geanie Cooley M.D.   On: 03/05/2014 22:10   Ct Abdomen Pelvis W Contrast  03/06/2014   CLINICAL DATA:  Left flank pain began after  recent vascular catheterization. Pain in the entire left leg. No fever, chills, nausea, vomiting.  EXAM: CT ABDOMEN AND PELVIS WITH CONTRAST  TECHNIQUE: Multidetector CT imaging of the abdomen and pelvis was performed using the standard protocol following bolus administration of intravenous contrast.  CONTRAST:  OMNIPAQUE IOHEXOL 300 MG/ML  SOLN  COMPARISON:  07/25/2013  FINDINGS: Lower chest: Dependent changes are seen in the lung bases. Scattered emphysematous changes are also present. There is atherosclerotic calcification of the coronary vessels. Heart size is normal. No pericardial effusion.  Upper abdomen: Scattered calcified granulomata are identified within the liver. No focal abnormality  identified within the spleen, pancreas, or left adrenal gland. There is a low-attenuation lesion adjacent to or involving the right adrenal gland, consistent with a benign process. The patient has had previous cholecystectomy. Bilateral renal cysts are noted.  Bowel: Stomach has a normal appearance. A midline abdominal hernia contains mesenteric fat and nondilated loops of small bowel and appears stable over prior studies. Colonic loops are normal in appearance. There is moderate stool burden. The appendix has a normal appearance.  Pelvis: There is moderate stranding in the subcutaneous tissues and abdominal wall of the left inguinal region. Findings are consistent with edema/ hematoma after recent catheterization. Age no evidence for intraperitoneal hematoma. The uterus is present. No adnexal mass. The urinary bladder is distended.  Retroperitoneum: No retroperitoneal or mesenteric adenopathy. There is dense atherosclerotic calcification of the abdominal aorta. No aneurysm. Right iliac stent.  Osseous structures: Degenerative changes in the lower thoracic and upper lumbar spine. There is 4 mm of anterolisthesis of L3 on L4.  IMPRESSION: 1. Left inguinal hematoma. This does not extend into the peritoneal cavity. 2.  Midline abdominal hernia contains mesenteric fat and nondilated loops of small bowel. 3. Distended urinary bladder. 4. Normal appendix. 5. Scattered emphysematous changes. 6. Coronary artery calcifications. 7. Calcified granulomata in the liver. 8. Benign right adrenal gland lesion.   Electronically Signed   By: Rosalie Gums M.D.   On: 03/06/2014 01:07   Portable Chest Xray In Am  03/07/2014   CLINICAL DATA:  Hypoxia  EXAM: PORTABLE CHEST - 1 VIEW  COMPARISON:  March 06, 2014  FINDINGS: Endotracheal tube tip is 2.5 cm above the carina. Central catheter tip is at the junction of the left innominate vein and superior vena cava. Nasogastric tube tip and side port are below the diaphragm. No pneumothorax. There is interstitial edema with cardiomegaly and pulmonary venous hypertension. There is a right effusion. There is a minimal left effusion as well. There is mild airspace consolidation in the left base. No adenopathy.  IMPRESSION: Tube and catheter positions as described without pneumothorax. Evidence of a degree of congestive heart failure. Question alveolar edema versus superimposed pneumonia left base.   Electronically Signed   By: Bretta Bang M.D.   On: 03/07/2014 07:23   Dg Chest Port 1 View  03/06/2014   CLINICAL DATA:  Endotracheal tube and central line placement.  EXAM: PORTABLE CHEST - 1 VIEW  COMPARISON:  Same day.  FINDINGS: Endotracheal tube is in grossly good position with distal tip 4 cm above the carina. Left internal jugular catheter line is noted with distal tip in expected position of the SVC. No pneumothorax is noted. Stable bilateral perihilar and basilar opacities are noted concerning for edema or pneumonia.  IMPRESSION: No pneumothorax status post left internal jugular catheter line placement. Endotracheal tube in grossly good position. These results will be called to the ordering clinician or representative by the Radiologist Assistant, and communication documented in the PACS or  zVision Dashboard.   Electronically Signed   By: Roque Lias M.D.   On: 03/06/2014 13:02   Dg Chest Port 1 View  03/06/2014   CLINICAL DATA:  Shortness of breath.  EXAM: PORTABLE CHEST - 1 VIEW  COMPARISON:  Chest radiograph performed 03/05/2014  FINDINGS: The lungs are well-aerated. Right basilar airspace opacity may reflect pneumonia or mild pulmonary edema. Underlying vascular congestion is noted. A small right pleural effusion is suspected. No pneumothorax is seen.  The cardiomediastinal silhouette is borderline enlarged. No acute osseous abnormalities  are seen.  IMPRESSION: 1. Right basilar airspace opacity may reflect pneumonia or mild pulmonary edema. Small right pleural effusion suspected. 2. Underlying vascular congestion and borderline cardiomegaly.   Electronically Signed   By: Roanna Raider M.D.   On: 03/06/2014 06:48   Dg Chest Portable 1 View  03/05/2014   CLINICAL DATA:  Leg pain.  Smoker.  EXAM: PORTABLE CHEST - 1 VIEW  COMPARISON:  Chest radiograph 09/25/2013  FINDINGS: Cardiac leads project over the chest. Cardiomediastinal silhouette is stable. There is atherosclerotic calcification in the thoracic aortic arch. Lung volumes are low resulting in crowding of pulmonary vascular markings. No airspace disease or pulmonary edema is identified. No acute bony abnormality appreciated.  IMPRESSION: Low lung volumes.  No acute findings identified.  Atherosclerosis of the thoracic aorta.   Electronically Signed   By: Britta Mccreedy M.D.   On: 03/05/2014 19:15   Dg Abd Portable 1v  03/06/2014   CLINICAL DATA:  NG tube placement.  EXAM: PORTABLE ABDOMEN - 1 VIEW  COMPARISON:  03/06/2014  FINDINGS: The endotracheal tube is 2.7 cm above the carina. The left IJ catheter tip is in the mid SVC. The NG tube tip is in the stomach. Persistent edema and right lower lobe airspace process.  IMPRESSION: The NG tube is in the stomach.  ETT is 2.7 cm above the carina.  Left IJ catheter tip is in the mid SVC.    Electronically Signed   By: Loralie Champagne M.D.   On: 03/06/2014 15:43   Dg Foot Complete Left  03/05/2014   CLINICAL DATA:  Left foot pain. Soft tissue ulceration. Redness and discoloration.  EXAM: LEFT FOOT - COMPLETE 3+ VIEW  COMPARISON:  08/10/2012  FINDINGS: There is no fracture or dislocation or bone destruction suggestive of osteomyelitis. There are 2 screws in the distal tibia and a side plate and multiple screws in the distal fibula. 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 Small plantar calcaneal spur.  IMPRESSION: No acute abnormalities.  No change since the prior exam.   Electronically Signed   By: Geanie Cooley M.D.   On: 03/05/2014 22:09    ASSESSMENT AND PLAN  1. Elevated troponin w/o CP  - ?demand ischemia vs NSTEMI  - echo shows EF 30s, takotsuko vs LAD disease  - plan for L +/- R heart cath once stable once off pressor and extubated   2. Acute respiratory failure  - ?COPD and PNA with staph bacteremia   - ID consulted, possible extubation today or tomorrow   3. Acute systolic HF  - Echo 03/06/2014 EF 30-35%, anterior, anteroseptal, apical and inferoapical hypokinesis, ?LAD disease vs Takotsubo's cardiomyopathy, grade 1 diastolic dysfunction  4. H/o CAD s/p RCA stent 2000  - previously followed by Dr. Dickie La before he retired  5. sepsis with elevated HR and WBC along with hypotension  - VSS, still on pressors, weaning  6. PVD s/p R bypass  7. DM2  8. Obesity  9. Left leg pain s/p recent vascular access  10. Microcytosis, ?iron deficient  11. Tobacco abuse   Signed, Azalee Course PA-C Pager: [phone]  Personally seen and examined. Agree with above. Troponin continues to rise. Will continue heparin IV today. Consider DC tomorrow after 48 hours of use.  Co-Ox 68%. CO increased (septic picture with underlying EF 30%) Pressors. SVR likely low.   GPC in clusters BCX's. ID seeing.  Will obtain TEE. Bedside is reasonable since she is able to be  sedated  on vent.  Will try to arrange for tomorrow.  Discussed with patient.  Will need formal consent. Sister present and will help with this.   Donato Schultz, MD

## 2014-03-07 NOTE — Progress Notes (Signed)
ANTICOAGULATION CONSULT NOTE - Follow Up Consult  Pharmacy Consult for heparin Indication: chest pain/ACS   Labs:  Recent Labs  03/05/14 1225 03/05/14 1650  03/06/14 0415 03/06/14 0555 03/06/14 0800 03/06/14 1015 03/06/14 1300 03/06/14 1718 03/06/14 1800 03/06/14 1830 03/07/14 0500  HGB 13.9  --   --  8.3* 8.3*  --   --  8.6*  --  8.9*  --   --   HCT 41.0  --   --  26.7* 26.7*  --   --  27.9*  --  28.5*  --   --   PLT  --   --   --  202 191  --   --  194  --  180  --   --   LABPROT  --   --   --   --   --   --   --  17.6*  --   --   --   --   INR  --   --   --   --   --   --   --  1.45  --   --   --   --   HEPARINUNFRC  --   --   --   --   --  <0.10*  --   --   --   --  <0.10* <0.10*  CREATININE 0.80 0.60  --  0.47*  --   --   --   --   --   --   --   --   CKTOTAL  --   --   --   --   --  806*  --   --   --   --   --   --   TROPONINI  --   --   < > 6.05*  --   --  10.57*  --  16.99*  --   --   --   < > = values in this interval not displayed.   Assessment: 58yo female remains undetectable on heparin despite rate increase; per RN hematoma is currently stable w/ no growth.  Goal of Therapy:  Heparin level 0.3-0.7 units/ml   Plan:  Will increase heparin gtt by 3 units/kg/hr to 1600 units/hr and check level in 6hr.  Vernard Gambles, PharmD, BCPS  03/07/2014,6:29 AM

## 2014-03-07 NOTE — Consult Note (Signed)
PULMONARY / CRITICAL CARE MEDICINE   Name: [name] MRN: [id number] DOB: 1955-09-22    ADMISSION DATE:  03/05/2014 CONSULTATION DATE:  9/14  REFERRING MD :  [facility]  CHIEF COMPLAINT: Left leg pain  INITIAL PRESENTATION: Left leg pain, hypotension and hypoxic.  STUDIES:    SIGNIFICANT EVENTS: 9/14 hypoxia, hypotension . ++ Trop I  SUBJECTIVE: remains on vent  VITAL SIGNS: Temp:  [98 F (36.7 C)-100 F (37.8 C)] 98.1 F (36.7 C) (09/15 0700) Pulse Rate:  [62-146] 68 (09/15 0700) Resp:  [0-45] 20 (09/15 0700) BP: (78-157)/(50-100) 94/62 mmHg (09/15 0700) SpO2:  [65 %-100 %] 98 % (09/15 0700) FiO2 (%):  [40 %-100 %] 40 % (09/15 0425) Weight:  [100.2 kg (220 lb 14.4 oz)] 100.2 kg (220 lb 14.4 oz) (09/15 0500) HEMODYNAMICS:   VENTILATOR SETTINGS: Vent Mode:  [-] PRVC FiO2 (%):  [40 %-100 %] 40 % Set Rate:  [14 bmp-20 bmp] 20 bmp Vt Set:  [500 mL] 500 mL PEEP:  [5 cmH20] 5 cmH20 Plateau Pressure:  [16 cmH20-25 cmH20] 20 cmH20 INTAKE / OUTPUT:  Intake/Output Summary (Last 24 hours) at 03/07/14 0826 Last data filed at 03/07/14 0700  Gross per 24 hour  Intake 3943.09 ml  Output   1500 ml  Net 2443.09 ml    PHYSICAL EXAMINATION: General:  Obese, on vent Neuro:  rass -1, nonfocal  HEENT:  JVD Cardiovascular: s1 s2 RRR Lungs:  ronchi no wheeze Abdomen:  nonTender, large ecchymotic area left femoral area and into abd. Hypo BS Musculoskeletal:  Lower ext PVD changes with multiple healing ulcers. Pulses palp Skin: Ulcers as noted, dry  LABS:  CBC  Recent Labs Lab 03/06/14 1300 03/06/14 1800 03/07/14 0500  WBC 7.8 7.8 17.2*  HGB 8.6* 8.9* 9.1*  HCT 27.9* 28.5* 29.7*  PLT 194 180 334   Coag's  Recent Labs Lab 03/06/14 1300  INR 1.45   BMET  Recent Labs Lab 03/05/14 1225 03/05/14 1650 03/06/14 0415  NA 130* 130* 132*  K 3.7 3.8 3.4*  CL 93* 92* 99  CO2  --  25 19  BUN CREATININE 0.80 0.60 0.47*  GLUCOSE 201* 175* 175*    Electrolytes  Recent Labs Lab 03/05/14 1650 03/06/14 0415 03/07/14 0500  CALCIUM 8.8 7.2*  --   MG  --   --  1.8  PHOS  --   --  1.4*   Sepsis Markers  Recent Labs Lab 03/05/14 2308 03/06/14 0415 03/06/14 0730 03/06/14 1400 03/06/14 1938 03/07/14 0500  LATICACIDVEN 1.5 0.8  --  1.1  --   --   PROCALCITON  --   --  0.62  --  1.59 1.34   ABG  Recent Labs Lab 03/06/14 1008 03/06/14 1251 03/06/14 1646  PHART 7.049* 7.174* 7.300*  PCO2ART 83.7* 57.8* 41.5  PO2ART 82.0 96.0 80.0   Liver Enzymes  Recent Labs Lab 03/05/14 1650 03/06/14 0415  AST 31 41*  ALT 11 15  ALKPHOS 78 64  BILITOT 0.7 0.6  ALBUMIN 3.0* 2.4*   Cardiac Enzymes  Recent Labs Lab 03/06/14 0415 03/06/14 1015 03/06/14 1718  TROPONINI 6.05* 10.57* 16.99*  PROBNP  --  8842.0*  --    Glucose  Recent Labs Lab 03/06/14 1108 03/06/14 1539 03/06/14 2044 03/07/14 0012 03/07/14 0408 03/07/14 0751  GLUCAP 221* 174* 224* 278* 268* 211*    Imaging Ct Abdomen Pelvis W Contrast  03/06/2014   CLINICAL DATA:  Left flank pain began after  recent vascular catheterization. Pain in the entire left leg. No fever, chills, nausea, vomiting.  EXAM: CT ABDOMEN AND PELVIS WITH CONTRAST  TECHNIQUE: Multidetector CT imaging of the abdomen and pelvis was performed using the standard protocol following bolus administration of intravenous contrast.  CONTRAST:  OMNIPAQUE IOHEXOL 300 MG/ML  SOLN  COMPARISON:  07/25/2013  FINDINGS: Lower chest: Dependent changes are seen in the lung bases. Scattered emphysematous changes are also present. There is atherosclerotic calcification of the coronary vessels. Heart size is normal. No pericardial effusion.  Upper abdomen: Scattered calcified granulomata are identified within the liver. No focal abnormality identified within the spleen, pancreas, or left adrenal gland. There is a low-attenuation lesion adjacent to or involving the right adrenal gland, consistent with a  benign process. The patient has had previous cholecystectomy. Bilateral renal cysts are noted.  Bowel: Stomach has a normal appearance. A midline abdominal hernia contains mesenteric fat and nondilated loops of small bowel and appears stable over prior studies. Colonic loops are normal in appearance. There is moderate stool burden. The appendix has a normal appearance.  Pelvis: There is moderate stranding in the subcutaneous tissues and abdominal wall of the left inguinal region. Findings are consistent with edema/ hematoma after recent catheterization. Age no evidence for intraperitoneal hematoma. The uterus is present. No adnexal mass. The urinary bladder is distended.  Retroperitoneum: No retroperitoneal or mesenteric adenopathy. There is dense atherosclerotic calcification of the abdominal aorta. No aneurysm. Right iliac stent.  Osseous structures: Degenerative changes in the lower thoracic and upper lumbar spine. There is 4 mm of anterolisthesis of L3 on L4.  IMPRESSION: 1. Left inguinal hematoma. This does not extend into the peritoneal cavity. 2. Midline abdominal hernia contains mesenteric fat and nondilated loops of small bowel. 3. Distended urinary bladder. 4. Normal appendix. 5. Scattered emphysematous changes. 6. Coronary artery calcifications. 7. Calcified granulomata in the liver. 8. Benign right adrenal gland lesion.   Electronically Signed   By: Rosalie Gums M.D.   On: 03/06/2014 01:07   Dg Chest Port 1 View  03/06/2014   CLINICAL DATA:  Endotracheal tube and central line placement.  EXAM: PORTABLE CHEST - 1 VIEW  COMPARISON:  Same day.  FINDINGS: Endotracheal tube is in grossly good position with distal tip 4 cm above the carina. Left internal jugular catheter line is noted with distal tip in expected position of the SVC. No pneumothorax is noted. Stable bilateral perihilar and basilar opacities are noted concerning for edema or pneumonia.  IMPRESSION: No pneumothorax status post left internal  jugular catheter line placement. Endotracheal tube in grossly good position. These results will be called to the ordering clinician or representative by the Radiologist Assistant, and communication documented in the PACS or zVision Dashboard.   Electronically Signed   By: Roque Lias M.D.   On: 03/06/2014 13:02   Dg Chest Port 1 View  03/06/2014   CLINICAL DATA:  Shortness of breath.  EXAM: PORTABLE CHEST - 1 VIEW  COMPARISON:  Chest radiograph performed 03/05/2014  FINDINGS: The lungs are well-aerated. Right basilar airspace opacity may reflect pneumonia or mild pulmonary edema. Underlying vascular congestion is noted. A small right pleural effusion is suspected. No pneumothorax is seen.  The cardiomediastinal silhouette is borderline enlarged. No acute osseous abnormalities are seen.  IMPRESSION: 1. Right basilar airspace opacity may reflect pneumonia or mild pulmonary edema. Small right pleural effusion suspected. 2. Underlying vascular congestion and borderline cardiomegaly.   Electronically Signed   By: Roanna Raider  M.D.   On: 03/06/2014 06:48   Dg Abd Portable 1v  03/06/2014   CLINICAL DATA:  NG tube placement.  EXAM: PORTABLE ABDOMEN - 1 VIEW  COMPARISON:  03/06/2014  FINDINGS: The endotracheal tube is 2.7 cm above the carina. The left IJ catheter tip is in the mid SVC. The NG tube tip is in the stomach. Persistent edema and right lower lobe airspace process.  IMPRESSION: The NG tube is in the stomach.  ETT is 2.7 cm above the carina.  Left IJ catheter tip is in the mid SVC.   Electronically Signed   By: Loralie Champagne M.D.   On: 03/06/2014 15:43     ASSESSMENT / PLAN:  PULMONARY OETT A: Hypoxia Tobacco abuse HCAP (evolving infiltrate RLL) AE COPD? > Emphysema on CT scan, no prior diagnosis of COPD P:   BD scheduled and PRN Solumedrol reduce pcxr in am abg reviewed, repeat now on current mv Likely to sbt today after above  CARDIOVASCULAR CVL A:  NSTEMI>+ trop I 6.5 9/14  incidental finding. Hypotension on  A diuretic and 2 antihypertensives Sepsis (fever 102.5, sbp <80, tachycardia P:  Cards is managing. Hx of CAD + stent 15 years ago Heparin per Cards ? Cath when stable Neo to map 60 Check cortisol level - no evidence ai Assess cvp  RENAL A:   Hypo k, hypo mag, hypo phos P:   Replace , phos, mag Saline maintain, await am chem remainder cvp assessment Allow  Pos baalance  GASTROINTESTINAL A:   npo P:   pepcid Start feeds  HEMATOLOGIC A:   Chronic anemia L inguinal hematoma Some hemoconcetartion P:  transfuse per protocol for hgb <7.0 Monitor left groin closely Cbc in am Hep infusion  INFECTIOUS A:  Bacteremia, related to recent angio? Vascular in nature Septic shock with fever 102.5  R/o HCAP  P:   BCx2  9/14>>2/2 Staph aureus>>> UC  9/14>> Sputum Abx: Vanc  , start date 9/14>>> Abx: cefepime, start date 9/14>>>9/15 Ancef 9/15>>> Ancef addition until know sens  I don-t see a reason for isolation with now staph isolated as well Consider TEE, will d/w cards / ID consult Repeat BC  ENDOCRINE A:   DM last Hbg a1c  7.2 P:   PER IM  NEUROLOGIC A:  Cent dyschroniy P:   fent rass goal -1  TODAY'S SUMMARY: Bacteremia, needs tee, add ancef, ID cosnult  I have personally obtained a history, examined the patient, evaluated laboratory and imaging results, formulated the assessment and plan and placed orders. CRITICAL CARE: The patient is critically ill with multiple organ systems failure and requires high complexity decision making for assessment and support, frequent evaluation and titration of therapies, application of advanced monitoring technologies and extensive interpretation of multiple databases. Critical Care Time devoted to patient care services described in this note is 35 minutes.   Mcarthur Rossetti. Tyson Alias, MD, FACP Pgr: [phone] [facility]   \

## 2014-03-08 ENCOUNTER — Inpatient Hospital Stay (HOSPITAL_COMMUNITY): Payer: BC Managed Care – PPO

## 2014-03-08 DIAGNOSIS — B958 Unspecified staphylococcus as the cause of diseases classified elsewhere: Secondary | ICD-10-CM

## 2014-03-08 DIAGNOSIS — M86169 Other acute osteomyelitis, unspecified tibia and fibula: Secondary | ICD-10-CM

## 2014-03-08 DIAGNOSIS — A4101 Sepsis due to Methicillin susceptible Staphylococcus aureus: Secondary | ICD-10-CM

## 2014-03-08 DIAGNOSIS — I059 Rheumatic mitral valve disease, unspecified: Secondary | ICD-10-CM

## 2014-03-08 LAB — COMPREHENSIVE METABOLIC PANEL
ALT: 50 U/L — AB (ref 0–35)
AST: 41 U/L — ABNORMAL HIGH (ref 0–37)
Albumin: 2.2 g/dL — ABNORMAL LOW (ref 3.5–5.2)
Alkaline Phosphatase: 72 U/L (ref 39–117)
Anion gap: 13 (ref 5–15)
BILIRUBIN TOTAL: 0.3 mg/dL (ref 0.3–1.2)
BUN: 11 mg/dL (ref 6–23)
CALCIUM: 7.9 mg/dL — AB (ref 8.4–10.5)
CHLORIDE: 100 meq/L (ref 96–112)
CO2: 21 meq/L (ref 19–32)
Creatinine, Ser: 0.41 mg/dL — ABNORMAL LOW (ref 0.50–1.10)
GLUCOSE: 220 mg/dL — AB (ref 70–99)
Potassium: 3.9 mEq/L (ref 3.7–5.3)
Sodium: 134 mEq/L — ABNORMAL LOW (ref 137–147)
Total Protein: 6 g/dL (ref 6.0–8.3)

## 2014-03-08 LAB — CBC WITH DIFFERENTIAL/PLATELET
BASOS ABS: 0 10*3/uL (ref 0.0–0.1)
Basophils Absolute: 0 10*3/uL (ref 0.0–0.1)
Basophils Relative: 0 % (ref 0–1)
Basophils Relative: 0 % (ref 0–1)
EOS ABS: 0 10*3/uL (ref 0.0–0.7)
EOS ABS: 0 10*3/uL (ref 0.0–0.7)
EOS PCT: 0 % (ref 0–5)
Eosinophils Relative: 0 % (ref 0–5)
HCT: 28.3 % — ABNORMAL LOW (ref 36.0–46.0)
HCT: 27.7 % — ABNORMAL LOW (ref 36.0–46.0)
Hemoglobin: 8.9 g/dL — ABNORMAL LOW (ref 12.0–15.0)
Hemoglobin: 8.7 g/dL — ABNORMAL LOW (ref 12.0–15.0)
LYMPHS ABS: 0.9 10*3/uL (ref 0.7–4.0)
LYMPHS ABS: 0.8 10*3/uL (ref 0.7–4.0)
Lymphocytes Relative: 6 % — ABNORMAL LOW (ref 12–46)
Lymphocytes Relative: 7 % — ABNORMAL LOW (ref 12–46)
MCH: 22 pg — ABNORMAL LOW (ref 26.0–34.0)
MCH: 22 pg — AB (ref 26.0–34.0)
MCHC: 31.4 g/dL (ref 30.0–36.0)
MCHC: 31.4 g/dL (ref 30.0–36.0)
MCV: 70 fL — AB (ref 78.0–100.0)
MCV: 70.1 fL — ABNORMAL LOW (ref 78.0–100.0)
MONOS PCT: 6 % (ref 3–12)
Monocytes Absolute: 0.9 10*3/uL (ref 0.1–1.0)
Monocytes Absolute: 0.7 10*3/uL (ref 0.1–1.0)
Monocytes Relative: 6 % (ref 3–12)
NEUTROS PCT: 88 % — AB (ref 43–77)
NEUTROS PCT: 87 % — AB (ref 43–77)
Neutro Abs: 13.4 10*3/uL — ABNORMAL HIGH (ref 1.7–7.7)
Neutro Abs: 9.2 10*3/uL — ABNORMAL HIGH (ref 1.7–7.7)
PLATELETS: 284 10*3/uL (ref 150–400)
Platelets: 231 10*3/uL (ref 150–400)
RBC: 4.04 MIL/uL (ref 3.87–5.11)
RBC: 3.95 MIL/uL (ref 3.87–5.11)
RDW: 18.6 % — AB (ref 11.5–15.5)
RDW: 18.5 % — ABNORMAL HIGH (ref 11.5–15.5)
WBC: 15.2 10*3/uL — ABNORMAL HIGH (ref 4.0–10.5)
WBC: 10.6 10*3/uL — ABNORMAL HIGH (ref 4.0–10.5)

## 2014-03-08 LAB — CULTURE, BLOOD (ROUTINE X 2)

## 2014-03-08 LAB — BLOOD GAS, ARTERIAL
ACID-BASE DEFICIT: 3.3 mmol/L — AB (ref 0.0–2.0)
Bicarbonate: 21.4 mEq/L (ref 20.0–24.0)
Drawn by: 39866
FIO2: 40 %
LHR: 14 {breaths}/min
MECHVT: 500 mL
O2 Saturation: 98.5 %
PEEP: 5 cmH2O
PO2 ART: 114 mmHg — AB (ref 80.0–100.0)
Patient temperature: 98.6
TCO2: 22.6 mmol/L (ref 0–100)
pCO2 arterial: 40.2 mmHg (ref 35.0–45.0)
pH, Arterial: 7.346 — ABNORMAL LOW (ref 7.350–7.450)

## 2014-03-08 LAB — HEPARIN LEVEL (UNFRACTIONATED)
Heparin Unfractionated: 0.52 IU/mL (ref 0.30–0.70)
Heparin Unfractionated: 0.67 IU/mL (ref 0.30–0.70)

## 2014-03-08 LAB — GLUCOSE, CAPILLARY
GLUCOSE-CAPILLARY: 192 mg/dL — AB (ref 70–99)
Glucose-Capillary: 174 mg/dL — ABNORMAL HIGH (ref 70–99)
Glucose-Capillary: 200 mg/dL — ABNORMAL HIGH (ref 70–99)
Glucose-Capillary: 202 mg/dL — ABNORMAL HIGH (ref 70–99)
Glucose-Capillary: 205 mg/dL — ABNORMAL HIGH (ref 70–99)
Glucose-Capillary: 221 mg/dL — ABNORMAL HIGH (ref 70–99)

## 2014-03-08 LAB — WOUND CULTURE: Gram Stain: NONE SEEN

## 2014-03-08 LAB — MAGNESIUM: Magnesium: 2.2 mg/dL (ref 1.5–2.5)

## 2014-03-08 LAB — PROCALCITONIN: Procalcitonin: 0.79 ng/mL

## 2014-03-08 LAB — PHOSPHORUS: Phosphorus: 1.7 mg/dL — ABNORMAL LOW (ref 2.3–4.6)

## 2014-03-08 MED ORDER — MIDAZOLAM HCL 2 MG/2ML IJ SOLN
INTRAMUSCULAR | Status: AC
  Administered 2014-03-08: 2 mg via INTRAVENOUS
  Filled 2014-03-08: qty 4

## 2014-03-08 MED ORDER — METHYLPREDNISOLONE SODIUM SUCC 40 MG IJ SOLR
10.0000 mg | Freq: Every day | INTRAMUSCULAR | Status: AC
  Administered 2014-03-09: 10 mg via INTRAVENOUS
  Filled 2014-03-08: qty 0.25

## 2014-03-08 MED ORDER — SODIUM CHLORIDE 0.9 % IV SOLN
INTRAVENOUS | Status: DC | PRN
  Administered 2014-03-09 – 2014-03-10 (×2): via INTRAVENOUS

## 2014-03-08 MED ORDER — INSULIN ASPART 100 UNIT/ML ~~LOC~~ SOLN: 20.0000 [IU] | SUBCUTANEOUS | Status: DC

## 2014-03-08 MED ORDER — INSULIN ASPART 100 UNIT/ML ~~LOC~~ SOLN
0.0000 [IU] | SUBCUTANEOUS | Status: DC
  Administered 2014-03-08: 7 [IU] via SUBCUTANEOUS
  Administered 2014-03-08 – 2014-03-09 (×3): 4 [IU] via SUBCUTANEOUS
  Administered 2014-03-09: 15 [IU] via SUBCUTANEOUS
  Administered 2014-03-09: 4 [IU] via SUBCUTANEOUS
  Administered 2014-03-09: 7 [IU] via SUBCUTANEOUS
  Administered 2014-03-09: 4 [IU] via SUBCUTANEOUS
  Administered 2014-03-09: 20 [IU] via SUBCUTANEOUS

## 2014-03-08 MED ORDER — DEXTROSE 5 % IV SOLN
30.0000 mmol | Freq: Once | INTRAVENOUS | Status: AC
  Administered 2014-03-08: 30 mmol via INTRAVENOUS
  Filled 2014-03-08: qty 10

## 2014-03-08 MED ORDER — FUROSEMIDE 10 MG/ML IJ SOLN
20.0000 mg | Freq: Three times a day (TID) | INTRAMUSCULAR | Status: AC
  Administered 2014-03-08 – 2014-03-09 (×4): 20 mg via INTRAVENOUS
  Filled 2014-03-08 (×4): qty 2

## 2014-03-08 MED ORDER — MIDAZOLAM HCL 2 MG/2ML IJ SOLN
2.0000 mg | Freq: Once | INTRAMUSCULAR | Status: AC
  Administered 2014-03-08: 2 mg via INTRAVENOUS

## 2014-03-08 NOTE — H&P (Signed)
    INTERVAL PROCEDURE H&P  History and Physical Interval Note:  03/08/2014 2:19 PM  [name] has presented today for their planned procedure. The various methods of treatment have been discussed with the patient and family. After consideration of risks, benefits and other options for treatment, the patient has consented to the procedure.  The patients' outpatient history has been reviewed, patient examined, and no change in status from most recent office note within the past 30 days. I have reviewed the patients' chart and labs and will proceed as planned. Questions were answered to the patient's satisfaction.   Chrystie Nose, MD, [facility] Attending Cardiologist [facility]  HILTY,Kenneth C 03/08/2014, 2:19 PM

## 2014-03-08 NOTE — CV Procedure (Signed)
    TRANSESOPHAGEAL ECHOCARDIOGRAM (TEE) NOTE  INDICATIONS: infective endocarditis  PROCEDURE:   Informed consent was obtained prior to the procedure. The risks, benefits and alternatives for the procedure were discussed and the patient comprehended these risks.  Risks include, but are not limited to, cough, sore throat, vomiting, nausea, somnolence, esophageal and stomach trauma or perforation, bleeding, low blood pressure, aspiration, pneumonia, infection, trauma to the teeth and death.    After a procedural time-out, the patient was given 2 mg versed and 50 mcg fentanyl for moderate sedation. She was intubated on a ventilator in the MICU. The oropharynx was anesthetized with a single cetacaine spray.  The transesophageal probe was inserted in the esophagus and stomach without difficulty and multiple views were obtained. The patient was stable, awake and alert at the end of the procedure.  Agitated microbubble saline contrast was not administered.  COMPLICATIONS:    There were no immediate complications.  Findings:  1. LEFT VENTRICLE: The left ventricular wall thickness is normal. The left ventricular cavity is dilated in size. Wall motion is notable for anterior, anteroseptal and apical hypokinesis.  LVEF is 35%.  2. RIGHT VENTRICLE:  The right ventricle is normal in structure and function without any thrombus or masses.    3. LEFT ATRIUM:  The left atrium is dilated in size without any thrombus or masses.  There is not spontaneous echo contrast ("smoke") in the left atrium consistent with a low flow state.  4. LEFT ATRIAL APPENDAGE:  The left atrial appendage is free of any thrombus or masses. The appendage has single lobes. Pulse doppler indicates moderate flow in the appendage.  5. ATRIAL SEPTUM:  The atrial septum appears intact and is free of thrombus and/or masses.  There is no evidence for interatrial shunting by color doppler and saline microbubble.  6. RIGHT ATRIUM:  The  right atrium is normal in size and function without any thrombus or masses.  7. MITRAL VALVE:  The mitral valve is normal in structure and function with Mild regurgitation.  There were no vegetations or stenosis.  8. AORTIC VALVE:  The aortic valve is trileaflet, normal in structure and function with no regurgitation.  There were no vegetations or stenosis  9. TRICUSPID VALVE:  The tricuspid valve is normal in structure and function with Mild regurgitation.  There were no vegetations or stenosis  10.  PULMONIC VALVE:  The pulmonic valve is normal in structure and function with no regurgitation.  There were no vegetations or stenosis.   11. AORTIC ARCH, ASCENDING AND DESCENDING AORTA:  There was no Myrtis Ser et. Al, 1992) atherosclerosis of the ascending aorta, aortic arch, or proximal descending aorta.  12. PULMONARY VEINS: Anomalous pulmonary venous return was not noted.  13. PERICARDIUM: The pericardium appeared normal and non-thickened.  There is no pericardial effusion.  IMPRESSION:   1. No LAA thrombus. 2. No valvular vegetation or abscess noted. 3.   Anterior, anteroseptal, apical and inferoapical hypokinesis - EF 35%  RECOMMENDATIONS:    1.  No evidence for valvular vegetation. Wall motion abnormalities persist as compared to her 2D echo on 03/06/2014.  Time Spent Directly with the Patient:  30 minutes   Chrystie Nose, MD, [facility] Attending Cardiologist [facility]  03/08/2014, 3:04 PM

## 2014-03-08 NOTE — Progress Notes (Signed)
Inpatient Diabetes Program Recommendations  AACE/ADA: New Consensus Statement on Inpatient Glycemic Control (2013)  Target Ranges:  Prepandial:   less than 140 mg/dL      Peak postprandial:   less than 180 mg/dL (1-2 hours)      Critically ill patients:  140 - 180 mg/dL   Results for AVAREE, GILBERTI (MRN [id number]) as of 03/08/2014 09:45  Ref. Range 03/07/2014 04:08 03/07/2014 07:51 03/07/2014 11:47 03/07/2014 16:54 03/07/2014 20:27 03/07/2014 23:50 03/08/2014 04:07 03/08/2014 08:07  Glucose-Capillary Latest Range: 70-99 mg/dL 409 (H) 811 (H) 914 (H) 234 (H) 187 (H) 205 (H) 221 (H) 202 (H)   Diabetes history: DM2 Outpatient Diabetes medications: Amaryl 2 mg BID, Metformin 1000 mg BID Current orders for Inpatient glycemic control: Novolog 0-15 units Q4H  Inpatient Diabetes Program Recommendations Insulin - Basal: CBG ranged from 187-268 over the past 24 hours and patient received a total of Novolog 34 units on 03/07/14 for correction. Please consider ordering Levemir 10 units Q24H starting now (based on 102 kg x 0.1 units). Correction (SSI): Please consider discontinuing current Novolog correction and use ICU Glycemic Control order set to improve inpatient glycemic control. Insulin - Meal Coverage: Please consider ordering Novolog 3 units Q4H for tube feeding coverage.  Thanks, Orlando Penner, RN, MSN, CCRN Diabetes Coordinator Inpatient Diabetes Program [phone] (Team Pager) [phone] ([facility] office) [phone] [facility] office)

## 2014-03-08 NOTE — Progress Notes (Signed)
Patient Name: [name] Date of Encounter: 03/08/2014     Principal Problem:   Sepsis Active Problems:   Left leg pain   Anemia   Diabetes mellitus, controlled    SUBJECTIVE  Intubated, however alert and follow command. Denies any CP or SOB, no GI symptom, some LLE pain  CURRENT MEDS . antiseptic oral rinse  7 mL Mouth Rinse QID  . aspirin  81 mg Per Tube Daily  . atorvastatin  40 mg Per Tube q1800  . ceFEPime (MAXIPIME) IV  2 g Intravenous Q12H  . chlorhexidine  15 mL Mouth Rinse BID  . darifenacin  7.5 mg Oral Daily  . escitalopram  10 mg Per Tube Daily  . famotidine (PEPCID) IV  20 mg Intravenous Q12H  . feeding supplement (VITAL HIGH PROTEIN)  1,000 mL Per Tube Q24H  . furosemide  20 mg Intravenous 3 times per day  . insulin aspart  0-20 Units Subcutaneous 6 times per day  . ipratropium-albuterol  3 mL Nebulization Q6H  . [START ON 03/09/2014] methylPREDNISolone (SOLU-MEDROL) injection  10 mg Intravenous Daily  . sodium phosphate  Dextrose 5% IVPB  30 mmol Intravenous Once  . vancomycin  1,000 mg Intravenous Q8H    OBJECTIVE  Filed Vitals:   03/08/14 0843 03/08/14 0900 03/08/14 1200 03/08/14 1237  BP: 98/48 97/58  94/50  Pulse:  90  108  Temp:   98 F (36.7 C)   TempSrc:   Oral   Resp:  10  20  Height:      Weight:      SpO2:  98%  100%    Intake/Output Summary (Last 24 hours) at 03/08/14 1307 Last data filed at 03/08/14 1200  Gross per 24 hour  Intake 1675.23 ml  Output   1850 ml  Net -174.77 ml   Filed Weights   03/06/14 0500 03/07/14 0500 03/08/14 0500  Weight: 213 lb 10 oz (96.9 kg) 220 lb 14.4 oz (100.2 kg) 225 lb 1.4 oz (102.1 kg)    PHYSICAL EXAM  General: NAD, intubated, however calm and able to follow command Neuro: Alert. Moves all extremities spontaneously. Psych: Normal affect. HEENT:  Normal  Neck: Supple without bruits or JVD. Lungs:  Resp regular and unlabored, anterior exam CTA with mild expiratory wheezing Heart: RRR no  s3, s4, or murmurs. Abdomen: Soft, non-tender, non-distended, BS + x 4.  Extremities: No clubbing, cyanosis. Bilateral lower extremity cold, minimal pulse with arterial ulcer  Accessory Clinical Findings  CBC  Recent Labs  03/06/14 0415  03/07/14 0500 03/08/14 0501  WBC 8.9  < > 17.2* 15.2*  NEUTROABS 7.8*  --   --  13.4*  HGB 8.3*  < > 9.1* 8.9*  HCT 26.7*  < > 29.7* 28.3*  MCV 70.4*  < > 71.9* 70.0*  PLT 202  < > 334 284  < > = values in this interval not displayed. Basic Metabolic Panel  Recent Labs  03/06/14 0415  03/07/14 1839 03/08/14 0501  NA 132*  --   --  134*  K 3.4*  --   --  3.9  CL 99  --   --  100  CO2 19  --   --  21  GLUCOSE 175*  --   --  220*  BUN 10  --   --  11  CREATININE 0.47*  --   --  0.41*  CALCIUM 7.2*  --   --  7.9*  MG  --   < >  2.2 2.2  PHOS  --   < > 2.5 1.7*  < > = values in this interval not displayed. Liver Function Tests  Recent Labs  03/06/14 0415 03/08/14 0501  AST 41* 41*  ALT 15 50*  ALKPHOS 64 72  BILITOT 0.6 0.3  PROT 5.5* 6.0  ALBUMIN 2.4* 2.2*    Recent Labs  03/05/14 2308  LIPASE 8*   Cardiac Enzymes  Recent Labs  03/06/14 0415 03/06/14 0800 03/06/14 1015 03/06/14 1718  CKTOTAL  --  806*  --   --   TROPONINI 6.05*  --  10.57* 16.99*   D-Dimer  Recent Labs  03/06/14 0555  DDIMER 3.31*    TELE NSR with HR 60-100s, no significant ventricular ectopy    ECG  9/13 NSR with HR 120s, nonspecific ST changes  Echocardiogram 03/06/2014  LV EF: 30% - 35%  ------------------------------------------------------------------- History: PMH: Sepsis, Left Leg Pain, Anemia, Stent. Cardiogenic Shock. Fever. Coronary artery disease. Chronic obstructive pulmonary disease. Risk factors: Current tobacco use. Hypertension. Diabetes mellitus. Dyslipidemia.  ------------------------------------------------------------------- Study Conclusions  - Procedure narrative: Transthoracic echocardiography.  Technically difficult study with reduced echo windows. An image enhancement agent was administered. - Left ventricle: The cavity size was normal. Wall thickness was normal. Systolic function was moderately to severely reduced. The estimated ejection fraction was in the range of 30% to 35%. There is anterior, anteroseptal apical and inferoapical hypokinesis - consistent with possible LAD territory ischemia/infarct or possible Takatsubo cardiomyopathy. There is relatively preserved contraction of the base of the heart. LV apical false tendon is noted, but no mural thrombus is identified. Doppler parameters are consistent with abnormal left ventricular relaxation (grade 1 diastolic dysfunction). The E/e&' ratio is >15, suggesting elevated LV Filling pressure. - Aortic valve: Mildly calcified leaflets. There was no stenosis. There was no significant regurgitation. - Mitral valve: Calcified annulus. There was mild regurgitation.  Impressions:  - LVEF 30-35% with primarily LAD territory wall motion artifact, however, hyperdynamic base - consider Takatsubo. No mural thrombus noted. Calcific aortic and mitral valve disease, poor echo windows otherwise.      Radiology/Studies  Dg Tibia/fibula Left  03/05/2014   CLINICAL DATA:  Lower leg pain.  EXAM: LEFT TIBIA AND FIBULA - 2 VIEW  COMPARISON:  Screws in the distal tibia and side plate and screws in the distal fibula. The fractures have healed. 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 No acute osseous abnormality. Extensive vascular calcifications and dermal calcifications in the lower leg.  FINDINGS: No acute abnormalities. Arthritis at the knee and ankle. Postsurgical changes.  IMPRESSION: Negative.   Electronically Signed   By: Geanie Cooley M.D.   On: 03/05/2014 22:10   Ct Abdomen Pelvis W Contrast  03/06/2014   CLINICAL DATA:  Left flank pain began after  recent vascular catheterization. Pain in the entire left leg. No fever, chills, nausea, vomiting.  EXAM: CT ABDOMEN AND PELVIS WITH CONTRAST  TECHNIQUE: Multidetector CT imaging of the abdomen and pelvis was performed using the standard protocol following bolus administration of intravenous contrast.  CONTRAST:  OMNIPAQUE IOHEXOL 300 MG/ML  SOLN  COMPARISON:  07/25/2013  FINDINGS: Lower chest: Dependent changes are seen in the lung bases. Scattered emphysematous changes are also present. There is atherosclerotic calcification of the coronary vessels. Heart size is normal. No pericardial effusion.  Upper abdomen: Scattered calcified granulomata are identified within the liver. No focal abnormality identified within the  spleen, pancreas, or left adrenal gland. There is a low-attenuation lesion adjacent to or involving the right adrenal gland, consistent with a benign process. The patient has had previous cholecystectomy. Bilateral renal cysts are noted.  Bowel: Stomach has a normal appearance. A midline abdominal hernia contains mesenteric fat and nondilated loops of small bowel and appears stable over prior studies. Colonic loops are normal in appearance. There is moderate stool burden. The appendix has a normal appearance.  Pelvis: There is moderate stranding in the subcutaneous tissues and abdominal wall of the left inguinal region. Findings are consistent with edema/ hematoma after recent catheterization. Age no evidence for intraperitoneal hematoma. The uterus is present. No adnexal mass. The urinary bladder is distended.  Retroperitoneum: No retroperitoneal or mesenteric adenopathy. There is dense atherosclerotic calcification of the abdominal aorta. No aneurysm. Right iliac stent.  Osseous structures: Degenerative changes in the lower thoracic and upper lumbar spine. There is 4 mm of anterolisthesis of L3 on L4.  IMPRESSION: 1. Left inguinal hematoma. This does not extend into the peritoneal cavity. 2.  Midline abdominal hernia contains mesenteric fat and nondilated loops of small bowel. 3. Distended urinary bladder. 4. Normal appendix. 5. Scattered emphysematous changes. 6. Coronary artery calcifications. 7. Calcified granulomata in the liver. 8. Benign right adrenal gland lesion.   Electronically Signed   By: Rosalie Gums M.D.   On: 03/06/2014 01:07   Portable Chest Xray In Am  03/07/2014   CLINICAL DATA:  Hypoxia  EXAM: PORTABLE CHEST - 1 VIEW  COMPARISON:  March 06, 2014  FINDINGS: Endotracheal tube tip is 2.5 cm above the carina. Central catheter tip is at the junction of the left innominate vein and superior vena cava. Nasogastric tube tip and side port are below the diaphragm. No pneumothorax. There is interstitial edema with cardiomegaly and pulmonary venous hypertension. There is a right effusion. There is a minimal left effusion as well. There is mild airspace consolidation in the left base. No adenopathy.  IMPRESSION: Tube and catheter positions as described without pneumothorax. Evidence of a degree of congestive heart failure. Question alveolar edema versus superimposed pneumonia left base.   Electronically Signed   By: Bretta Bang M.D.   On: 03/07/2014 07:23   Dg Chest Port 1 View  03/06/2014   CLINICAL DATA:  Endotracheal tube and central line placement.  EXAM: PORTABLE CHEST - 1 VIEW  COMPARISON:  Same day.  FINDINGS: Endotracheal tube is in grossly good position with distal tip 4 cm above the carina. Left internal jugular catheter line is noted with distal tip in expected position of the SVC. No pneumothorax is noted. Stable bilateral perihilar and basilar opacities are noted concerning for edema or pneumonia.  IMPRESSION: No pneumothorax status post left internal jugular catheter line placement. Endotracheal tube in grossly good position. These results will be called to the ordering clinician or representative by the Radiologist Assistant, and communication documented in the PACS or  [facility]V[employer] Dashboard.   Electronically Signed   By: Roque Lias M.D.   On: 03/06/2014 13:02   Dg Chest Port 1 View  03/06/2014   CLINICAL DATA:  Shortness of breath.  EXAM: PORTABLE CHEST - 1 VIEW  COMPARISON:  Chest radiograph performed 03/05/2014  FINDINGS: The lungs are well-aerated. Right basilar airspace opacity may reflect pneumonia or mild pulmonary edema. Underlying vascular congestion is noted. A small right pleural effusion is suspected. No pneumothorax is seen.  The cardiomediastinal silhouette is borderline enlarged. No acute osseous abnormalities are seen.  IMPRESSION: 1. Right basilar airspace opacity may reflect pneumonia or mild pulmonary edema. Small right pleural effusion suspected. 2. Underlying vascular congestion and borderline cardiomegaly.   Electronically Signed   By: Roanna Raider M.D.   On: 03/06/2014 06:48   Dg Chest Portable 1 View  03/05/2014   CLINICAL DATA:  Leg pain.  Smoker.  EXAM: PORTABLE CHEST - 1 VIEW  COMPARISON:  Chest radiograph 09/25/2013  FINDINGS: Cardiac leads project over the chest. Cardiomediastinal silhouette is stable. There is atherosclerotic calcification in the thoracic aortic arch. Lung volumes are low resulting in crowding of pulmonary vascular markings. No airspace disease or pulmonary edema is identified. No acute bony abnormality appreciated.  IMPRESSION: Low lung volumes.  No acute findings identified.  Atherosclerosis of the thoracic aorta.   Electronically Signed   By: Britta Mccreedy M.D.   On: 03/05/2014 19:15   Dg Abd Portable 1v  03/06/2014   CLINICAL DATA:  NG tube placement.  EXAM: PORTABLE ABDOMEN - 1 VIEW  COMPARISON:  03/06/2014  FINDINGS: The endotracheal tube is 2.7 cm above the carina. The left IJ catheter tip is in the mid SVC. The NG tube tip is in the stomach. Persistent edema and right lower lobe airspace process.  IMPRESSION: The NG tube is in the stomach.  ETT is 2.7 cm above the carina.  Left IJ catheter tip is in the mid SVC.    Electronically Signed   By: Loralie Champagne M.D.   On: 03/06/2014 15:43   Dg Foot Complete Left  03/05/2014   CLINICAL DATA:  Left foot pain. Soft tissue ulceration. Redness and discoloration.  EXAM: LEFT FOOT - COMPLETE 3+ VIEW  COMPARISON:  08/10/2012  FINDINGS: There is no fracture or dislocation or bone destruction suggestive of osteomyelitis. There are 2 screws in the distal tibia and a side plate and multiple screws in the distal fibula. 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 Small plantar calcaneal spur.  IMPRESSION: No acute abnormalities.  No change since the prior exam.   Electronically Signed   By: Geanie Cooley M.D.   On: 03/05/2014 22:09    ASSESSMENT AND PLAN  1. Elevated troponin w/o CP  - ?demand ischemia vs NSTEMI  - echo shows EF 30s, takotsuko vs LAD disease  - plan for L +/- R heart cath once stable once off pressor and extubated   2. Acute respiratory failure  - ?COPD and PNA with staph bacteremia   - ID consulted, possible extubation today or tomorrow   3. Acute systolic HF  - Echo 03/06/2014 EF 30-35%, anterior, anteroseptal, apical and inferoapical hypokinesis, ?LAD disease vs Takotsubo's cardiomyopathy, grade 1 diastolic dysfunction  4. H/o CAD s/p RCA stent 2000  - previously followed by Dr. Dickie La before he retired  5. sepsis with elevated HR and WBC along with hypotension  - VSS, still on pressors, weaning  6. PVD s/p R bypass  7. DM2  8. Obesity  9. Left leg pain s/p recent vascular access  10. Microcytosis, ?iron deficient  11. Tobacco abuse   Mathews Robinsons PA-C Pager: [phone]  Personally seen and examined. Agree with above. Troponin continues to rise. Will continue heparin IV today. Consider DC tomorrow after 48 hours of use.  Co-Ox 68%. CO increased (septic picture with underlying EF 30%) Pressors. SVR likely low.   GPC in clusters BCX's. ID seeing.  Will obtain TEE. Bedside is reasonable since she is able to be   sedated on vent.  Will try to arrange for tomorrow.  Discussed with patient.  Will need formal consent. Sister present and will help with this.   Donato Schultz, MD

## 2014-03-08 NOTE — Progress Notes (Addendum)
    Subjective:  Alert on vent.   Objective:  Vital Signs in the last 24 hours: Temp:  [97 F (36.1 C)-98.4 F (36.9 C)] 98 F (36.7 C) (09/16 1200) Pulse Rate:  [66-112] 102 (09/16 1300) Resp:  [0-26] 13 (09/16 1300) BP: (92-130)/(48-90) 112/65 mmHg (09/16 1300) SpO2:  [97 %-100 %] 99 % (09/16 1300) FiO2 (%):  [40 %] 40 % (09/16 1237) Weight:  [225 lb 1.4 oz (102.1 kg)] 225 lb 1.4 oz (102.1 kg) (09/16 0500)  Intake/Output from previous day: 09/15 0701 - 09/16 0700 In: 2133 [I.V.:1068; IV Piggyback:1065] Out: 1700 [Urine:1700]   Physical Exam: General: Well developed, well nourished Head:  Normocephalic and atraumatic. On vent Lungs: Vent noise. Mild wheeze Heart: Normal S1 and S2.  No murmur, rubs or gallops.  Abdomen: soft, non-tender, positive bowel sounds. Extremities: No clubbing or cyanosis. Mild edema. Pigmented LE.  Neurologic: Alert    Lab Results:  Recent Labs  03/07/14 0500 03/08/14 0501  WBC 17.2* 15.2*  HGB 9.1* 8.9*  PLT 334 284    Recent Labs  03/06/14 0415 03/08/14 0501  NA 132* 134*  K 3.4* 3.9  CL 99 100  CO2 19 21  GLUCOSE 175* 220*  BUN 10 11  CREATININE 0.47* 0.41*    Recent Labs  03/06/14 1015 03/06/14 1718  TROPONINI 10.57* 16.99*   Hepatic Function Panel  Recent Labs  03/08/14 0501  PROT 6.0  ALBUMIN 2.2*  AST 41*  ALT 50*  ALKPHOS 72  BILITOT 0.3   Assessment/Plan:   58 year old with demand ischemia vs NSTEMI in the setting of GPC sepsis with cardiomyopathy EF 30-35% with appearance of Takotsubo cardiomyopathy or LAD disease  1. TEE today 2. Agree with IV lasix (CXR with edema pattern) 3. Stopping heparin IV (has been on for 48 hours) 4. Once stable, right and left heart cath to define anatomy.  5. Once off pressors, gently add Bb, ACE-I.   Kaitlin Schultz, MD       [name], [name] 03/08/2014, 1:15 PM

## 2014-03-08 NOTE — Progress Notes (Signed)
*  PRELIMINARY RESULTS* Echocardiogram TEE has been performed.  [name] 03/08/2014, 3:22 PM

## 2014-03-08 NOTE — Progress Notes (Signed)
PULMONARY / CRITICAL CARE MEDICINE   Name: [name] MRN: [id number] DOB: [birth year]    ADMISSION DATE:  03/05/2014 CONSULTATION DATE:  9/14  REFERRING MD :  [facility]  CHIEF COMPLAINT: Left leg pain  INITIAL PRESENTATION: Left leg pain, hypotension and hypoxic.  STUDIES:  9/14  Echo- EF 30-35%   SIGNIFICANT EVENTS: 9/14 hypoxia, hypotension . ++ Trop I 9/14 intubated 9/15 pos BC MSSA  9/16 TEE>>>  SUBJECTIVE: remains on vent  VITAL SIGNS: Temp:  [97 F (36.1 C)-98.4 F (36.9 C)] 98.3 F (36.8 C) (09/16 0800) Pulse Rate:  [66-110] 91 (09/16 0800) Resp:  [9-26] 14 (09/16 0800) BP: (92-130)/(48-90) 98/48 mmHg (09/16 0843) SpO2:  [96 %-100 %] 98 % (09/16 0800) FiO2 (%):  [40 %] 40 % (09/16 0920) Weight:  [225 lb 1.4 oz (102.1 kg)] 225 lb 1.4 oz (102.1 kg) (09/16 0500) HEMODYNAMICS: CVP:  [8 mmHg-18 mmHg] 15 mmHg VENTILATOR SETTINGS: Vent Mode:  [-] PRVC FiO2 (%):  [40 %] 40 % Set Rate:  [14 bmp-20 bmp] 14 bmp Vt Set:  [500 mL] 500 mL PEEP:  [5 cmH20] 5 cmH20 Pressure Support:  [5 cmH20-8 cmH20] 8 cmH20 Plateau Pressure:  [17 cmH20-21 cmH20] 18 cmH20 INTAKE / OUTPUT:  Intake/Output Summary (Last 24 hours) at 03/08/14 0931 Last data filed at 03/08/14 0600  Gross per 24 hour  Intake 2024.75 ml  Output   1700 ml  Net 324.75 ml    PHYSICAL EXAMINATION: General:  Obese female on vent, NAD Neuro:  rass -1, nonfocal  HEENT:  JVD Cardiovascular: s1 s2 RRR Lungs:  ronchi no wheeze, SBT Abdomen:  nonTender, large ecchymotic area left femoral area and into abd. Hypo BS Musculoskeletal:  Lower ext PVD changes with multiple healing ulcers. Pulses palp,  Skin: Ulcers as noted, dry, R foot cool to touch, L warm and edematous  LABS:  CBC  Recent Labs Lab 03/06/14 1800 03/07/14 0500 03/08/14 0501  WBC 7.8 17.2* 15.2*  HGB 8.9* 9.1* 8.9*  HCT 28.5* 29.7* 28.3*  PLT 180 334 284   Coag's  Recent Labs Lab 03/06/14 1300  INR 1.45   BMET  Recent  Labs Lab 03/05/14 1650 03/06/14 0415 03/08/14 0501  NA 130* 132* 134*  K 3.8 3.4* 3.9  CL 92* 99 100  CO2 BUN CREATININE 0.60 0.47* 0.41*  GLUCOSE 175* 175* 220*   Electrolytes  Recent Labs Lab 03/05/14 1650 03/06/14 0415  03/07/14 0500 03/07/14 1200 03/07/14 1839 03/08/14 0501  CALCIUM 8.8 7.2*  --   --   --   --  7.9*  MG  --   --   < > 1.8 2.4 2.2 2.2  PHOS  --   --   --  1.4*  --  2.5 1.7*  < > = values in this interval not displayed. Sepsis Markers  Recent Labs Lab 03/05/14 2308 03/06/14 0415  03/06/14 1400 03/06/14 1938 03/07/14 0500 03/08/14 0501  LATICACIDVEN 1.5 0.8  --  1.1  --   --   --   PROCALCITON  --   --   < >  --  1.59 1.34 0.79  < > = values in this interval not displayed. ABG  Recent Labs Lab 03/06/14 1646 03/07/14 0936 03/08/14 0500  PHART 7.300* 7.313* 7.346*  PCO2ART 41.5 40.3 40.2  PO2ART 80.0 76.0* 114.0*   Liver Enzymes  Recent Labs Lab 03/05/14 1650 03/06/14 0415 03/08/14 0501  AST 31 41*  41*  ALT 11 15 50*  ALKPHOS 78 64 72  BILITOT 0.7 0.6 0.3  ALBUMIN 3.0* 2.4* 2.2*   Cardiac Enzymes  Recent Labs Lab 03/06/14 0415 03/06/14 1015 03/06/14 1718  TROPONINI 6.05* 10.57* 16.99*  PROBNP  --  8842.0*  --    Glucose  Recent Labs Lab 03/07/14 1147 03/07/14 1654 03/07/14 2027 03/07/14 2350 03/08/14 0407 03/08/14 0807  GLUCAP 203* 234* 187* 205* 221* 202*    Imaging Portable Chest Xray In Am  03/07/2014   CLINICAL DATA:  Hypoxia  EXAM: PORTABLE CHEST - 1 VIEW  COMPARISON:  March 06, 2014  FINDINGS: Endotracheal tube tip is 2.5 cm above the carina. Central catheter tip is at the junction of the left innominate vein and superior vena cava. Nasogastric tube tip and side port are below the diaphragm. No pneumothorax. There is interstitial edema with cardiomegaly and pulmonary venous hypertension. There is a right effusion. There is a minimal left effusion as well. There is mild airspace  consolidation in the left base. No adenopathy.  IMPRESSION: Tube and catheter positions as described without pneumothorax. Evidence of a degree of congestive heart failure. Question alveolar edema versus superimposed pneumonia left base.   Electronically Signed   By: Bretta Bang M.D.   On: 03/07/2014 07:23     ASSESSMENT / PLAN:  PULMONARY OETT>>9/14 A: Hypoxia - pulm edema Tobacco abuse HCAP (evolving infiltrate RLL) AE COPD? > Emphysema on CT scan, no prior diagnosis of COPD P:   BD scheduled and PRN Solumedrol reduced to 40 mg/mL - reduce rapid pcxr in am abg reviewed, keep same MV SBT initiated 9/16 Requires neg balance, lasix  CARDIOVASCULAR CVL>>9/14 A:  NSTEMI>+ trop I 6.5 9/14 incidental finding. Septic shock- resolving R/o endocarditis P:  Cards is managing. Hx of CAD + stent 15 years ago Heparin per Cards Plan for cath when stable Neo to map 60 (61 9/16) - improved Check cortisol level - no evidence ai (cortisol 63.3 9/14), will dc steroids as not needed for lungs  Assess cvp (9/16 15) - lasix   RENAL A:   Hypo K- resolved hypo mag hypo phos P:   Replace phos now iv Saline maintain - kvo cvp assessment -dc Lasix start to goal neg 2 liters, she is off pressors   GASTROINTESTINAL A:   feeds P:   pepcid Start feeds after tee  HEMATOLOGIC A:   Chronic anemia L inguinal hematoma Some hemoconcetartion P:  transfuse per protocol for hgb <7.0 Monitor left groin closely Cbc in am Hep infusion  INFECTIOUS A:  Bacteremia, related to recent angio? Vascular in nature Septic shock -improving R/o HCAP  P:   BCx2  9/14>>2/2 Staph aureus>>> UC  9/14>>negative Sputum Abx: Vanc  , start date 9/14>>> Abx: cefepime, start date 9/14>>> Ancef 9/15>>>9/15 Consider TEE, will d/w cards / ID consult Repeat BC  Per ID, consider Ancef   ENDOCRINE A:   DM last Hbg a1c  7.2 NICE goal 140-180 P:   To res SSI lantus likely after tf  started   NEUROLOGIC A:  Vent dyschrony P:   fent rass goal -1  TODAY'S SUMMARY: Bacteremia, needs tee, weaning, lasix , edema  I have personally obtained a history, examined the patient, evaluated laboratory and imaging results, formulated the assessment and plan and placed orders. CRITICAL CARE: The patient is critically ill with multiple organ systems failure and requires high complexity decision making for assessment and support, frequent evaluation and titration of therapies, application  of advanced monitoring technologies and extensive interpretation of multiple databases. Critical Care Time devoted to patient care services described in this note is 30 minutes.   Beckey Rutter, PA-S  [facility]. Tyson Alias, MD, FACP Pgr: [phone] [facility]

## 2014-03-08 NOTE — Progress Notes (Addendum)
ANTICOAGULATION CONSULT NOTE - Follow Up Consult  Pharmacy Consult for Heparin  Indication: chest pain/ACS  Allergies  Allergen Reactions  . Penicillins Hives    Tolerates cefepime  . Adhesive [Tape] Rash    Patient Measurements: Height:  (157.5 cm) Weight: 220 lb 14.4 oz (100.2 kg) IBW/kg (Calculated) : 50.1 Heparin Dosing Weight: 72 kg  Vital Signs: Temp: 98.4 F (36.9 C) (09/15 2351) Temp src: Oral (09/15 2351) BP: 112/68 mmHg (09/16 0000) Pulse Rate: 90 (09/16 0000)  Labs:  Recent Labs  03/05/14 1225 03/05/14 1650  03/06/14 0415  03/06/14 0800 03/06/14 1015 03/06/14 1300 03/06/14 1718 03/06/14 1800  03/07/14 0500 03/07/14 1530 03/08/14 0010  HGB 13.9  --   --  8.3*  < >  --   --  8.6*  --  8.9*  --  9.1*  --   --   HCT 41.0  --   --  26.7*  < >  --   --  27.9*  --  28.5*  --  29.7*  --   --   PLT  --   --   --  202  < >  --   --  194  --  180  --  334  --   --   LABPROT  --   --   --   --   --   --   --  17.6*  --   --   --   --   --   --   INR  --   --   --   --   --   --   --  1.45  --   --   --   --   --   --   HEPARINUNFRC  --   --   --   --   --  <0.10*  --   --   --   --   < > <0.10* 0.29* 0.52  CREATININE 0.80 0.60  --  0.47*  --   --   --   --   --   --   --   --   --   --   CKTOTAL  --   --   --   --   --  806*  --   --   --   --   --   --   --   --   TROPONINI  --   --   < > 6.05*  --   --  10.57*  --  16.99*  --   --   --   --   --   < > = values in this interval not displayed.  Estimated Creatinine Clearance: 86.9 ml/min (by C-G formula based on Cr of 0.47).   Assessment: Therapeutic heparin level x 1 after rate increase. Pt noted to have external hematoma, RN states has increased some (will need close monitoring), RN states calling [facility] to discuss, trend Hgb.   Goal of Therapy:  Heparin level 0.3-0.7 units/ml Monitor platelets by anticoagulation protocol: Yes   Plan:  -Continue heparin at 1750 units/hr -AM HL  -Daily  CBC/HL -Monitor for bleeding, trend Hgb  Ledford, James 03/08/2014,1:00 AM   Addendum  -Morning heparin level therapeutic on 1750 units/hr -Daily HL, CBC -Continue to monitor hematoma -Next HL with am labs tomorrow   Agapito Games, PharmD, BCPS Clinical Pharmacist Pager: [phone] 03/08/2014 7:02 AM

## 2014-03-08 NOTE — Progress Notes (Signed)
Pharmacist Heart Failure Core Measure Documentation  Assessment: SABRINE PATCHEN has an EF documented as 30-35% on 03/06/14.  Rationale: Heart failure patients with left ventricular systolic dysfunction (LVSD) and an EF < 40% should be prescribed an angiotensin converting enzyme inhibitor (ACEI) or angiotensin receptor blocker (ARB) at discharge unless a contraindication is documented in the medical record.  This patient is not currently on an ACEI or ARB for HF.  This note is being placed in the record in order to provide documentation that a contraindication to the use of these agents is present for this encounter.  ACE Inhibitor or Angiotensin Receptor Blocker is contraindicated (specify all that apply)    ACEI allergy AND ARB allergy   Angioedema   Moderate or severe aortic stenosis   Hyperkalemia   Hypotension   Renal artery stenosis   Worsening renal function, preexisting renal disease or dysfunction    Baldemar Friday 03/08/2014 7:57 AM

## 2014-03-08 NOTE — Progress Notes (Signed)
INFECTIOUS DISEASE PROGRESS NOTE  ID: [name] is a 58 y.o. female with  Principal Problem:   Sepsis Active Problems:   Left leg pain   Anemia   Diabetes mellitus, controlled  Subjective: Awake and alert  Abtx:  Anti-infectives   Start     Dose/Rate Route Frequency Ordered Stop   03/07/14 1200  ceFEPIme (MAXIPIME) 2 g in dextrose 5 % 50 mL IVPB     2 g 100 mL/hr over 30 Minutes Intravenous Every 12 hours 03/07/14 1108     03/07/14 0930  ceFAZolin (ANCEF) IVPB 2 g/50 mL premix  Status:  Discontinued     2 g 100 mL/hr over 30 Minutes Intravenous 3 times per day 03/07/14 0853 03/07/14 1108   03/06/14 0400  ceFEPIme (MAXIPIME) 1 g in dextrose 5 % 50 mL IVPB  Status:  Discontinued     1 g 100 mL/hr over 30 Minutes Intravenous Every 8 hours 03/05/14 1832 03/07/14 0849   03/06/14 0400  vancomycin (VANCOCIN) IVPB 1000 mg/200 mL premix     1,000 mg 200 mL/hr over 60 Minutes Intravenous Every 8 hours 03/05/14 1839     03/05/14 1845  ceFEPIme (MAXIPIME) 1 g in dextrose 5 % 50 mL IVPB     1 g 100 mL/hr over 30 Minutes Intravenous  Once 03/05/14 1832 03/05/14 2014   03/05/14 1845  vancomycin (VANCOCIN) 500 mg in sodium chloride 0.9 % 100 mL IVPB  Status:  Discontinued     500 mg 100 mL/hr over 60 Minutes Intravenous  Once 03/05/14 1839 03/07/14 0854   03/05/14 1830  vancomycin (VANCOCIN) IVPB 1000 mg/200 mL premix     1,000 mg 200 mL/hr over 60 Minutes Intravenous  Once 03/05/14 1820 03/05/14 2121      Medications:  Scheduled: . antiseptic oral rinse  7 mL Mouth Rinse QID  . aspirin  81 mg Per Tube Daily  . atorvastatin  40 mg Per Tube q1800  . ceFEPime (MAXIPIME) IV  2 g Intravenous Q12H  . chlorhexidine  15 mL Mouth Rinse BID  . darifenacin  7.5 mg Oral Daily  . escitalopram  10 mg Per Tube Daily  . famotidine (PEPCID) IV  20 mg Intravenous Q12H  . feeding supplement (PRO-STAT SUGAR FREE 64)  30 mL Per Tube BID  . feeding supplement (VITAL HIGH PROTEIN)  1,000 mL Per  Tube Q24H  . insulin aspart  0-15 Units Subcutaneous 6 times per day  . ipratropium-albuterol  3 mL Nebulization Q6H  . methylPREDNISolone (SOLU-MEDROL) injection  40 mg Intravenous Q12H  . vancomycin  1,000 mg Intravenous Q8H    Objective: Vital signs in last 24 hours: Temp:  [97 F (36.1 C)-98.4 F (36.9 C)] 98 F (36.7 C) (09/16 0410) Pulse Rate:  [66-110] 91 (09/16 0800) Resp:  [9-26] 14 (09/16 0800) BP: (92-130)/(51-90) 99/63 mmHg (09/16 0800) SpO2:  [95 %-100 %] 98 % (09/16 0800) FiO2 (%):  [40 %] 40 % (09/16 0805) Weight:  [102.1 kg (225 lb 1.4 oz)] 102.1 kg (225 lb 1.4 oz) (09/16 0500)   General appearance: alert and no distress Resp: diminished breath sounds bilaterally Cardio: tachycardia GI: normal findings: bowel sounds normal and soft, non-tender Extr- improved warmth, R cooler than L.   Lab Results  Recent Labs  03/06/14 0415  03/07/14 0500 03/08/14 0501  WBC 8.9  < > 17.2* 15.2*  HGB 8.3*  < > 9.1* 8.9*  HCT 26.7*  < > 29.7* 28.3*  NA  132*  --   --  134*  K 3.4*  --   --  3.9  CL 99  --   --  100  CO2 19  --   --  21  BUN 10  --   --  11  CREATININE 0.47*  --   --  0.41*  < > = values in this interval not displayed. Liver Panel  Recent Labs  03/06/14 0415 03/08/14 0501  PROT 5.5* 6.0  ALBUMIN 2.4* 2.2*  AST 41* 41*  ALT 15 50*  ALKPHOS 64 72  BILITOT 0.6 0.3   Sedimentation Rate  Recent Labs  03/05/14 2308  ESRSEDRATE 40*   C-Reactive Protein No results found for this basename: CRP,  in the last 72 hours  Microbiology: Recent Results (from the past 240 hour(s))  URINE CULTURE     Status: None   Collection Time    03/05/14  5:05 PM      Result Value Ref Range Status   Specimen Description URINE, RANDOM   Final   Special Requests NONE   Final   Culture  Setup Time     Final   Value: 03/05/2014 18:00     Performed at [facility] Count     Final   Value: NO GROWTH     Performed at [facility]    Culture     Final   Value: NO GROWTH     Performed at [facility]   Report Status 03/07/2014 FINAL   Final  CULTURE, BLOOD (ROUTINE X 2)     Status: None   Collection Time    03/05/14  5:12 PM      Result Value Ref Range Status   Specimen Description BLOOD RIGHT ARM   Final   Special Requests BOTTLES DRAWN AEROBIC AND ANAEROBIC 7CC EA   Final   Culture  Setup Time     Final   Value: 03/06/2014 00:49     Performed at [facility]   Culture     Final   Value: STAPHYLOCOCCUS AUREUS     Note: RIFAMPIN AND GENTAMICIN SHOULD NOT BE USED AS SINGLE DRUGS FOR TREATMENT OF STAPH INFECTIONS.     Note: Gram Stain Report Called to,Read Back By and Verified With: THERESA@1 :30PM ON 03/06/14 BY DANTS     Performed at [facility]   Report Status 03/08/2014 FINAL   Final   Organism ID, Bacteria STAPHYLOCOCCUS AUREUS   Final  CULTURE, BLOOD (ROUTINE X 2)     Status: None   Collection Time    03/05/14  6:05 PM      Result Value Ref Range Status   Specimen Description BLOOD RIGHT FOREARM   Final   Special Requests BOTTLES DRAWN AEROBIC AND ANAEROBIC 10CC EA   Final   Culture  Setup Time     Final   Value: 03/06/2014 00:49     Performed at [facility]   Culture     Final   Value: STAPHYLOCOCCUS AUREUS     Note: SUSCEPTIBILITIES PERFORMED ON PREVIOUS CULTURE WITHIN THE LAST 5 DAYS.     Note: Gram Stain Report Called to,Read Back By and Verified With: THERESA@1 :30PM ON 03/06/14 BY DANTS     Performed at [facility]   Report Status 03/08/2014 FINAL   Final  MRSA PCR SCREENING     Status: None   Collection Time    03/05/14 10:28 PM  Result Value Ref Range Status   MRSA by PCR NEGATIVE  NEGATIVE Final   Comment:            The GeneXpert MRSA Assay (FDA     approved for NASAL specimens     only), is one component of a     comprehensive MRSA colonization     surveillance program. It is not     intended to diagnose MRSA     infection nor to guide or      monitor treatment for     MRSA infections.  WOUND CULTURE     Status: None   Collection Time    03/05/14 11:52 PM      Result Value Ref Range Status   Specimen Description WOUND RIGHT GROIN   Final   Special Requests NONE   Final   Gram Stain     Final   Value: NO WBC SEEN     RARE SQUAMOUS EPITHELIAL CELLS PRESENT     MODERATE GRAM NEGATIVE RODS     Performed at [facility]   Culture     Final   Value: MODERATE PSEUDOMONAS AERUGINOSA     Performed at [facility]   Report Status 03/08/2014 FINAL   Final   Organism ID, Bacteria PSEUDOMONAS AERUGINOSA   Final  URINE CULTURE     Status: None   Collection Time    03/06/14  9:15 AM      Result Value Ref Range Status   Specimen Description URINE, CATHETERIZED   Final   Special Requests NONE   Final   Culture  Setup Time     Final   Value: 03/06/2014 13:06     Performed at [facility] Count     Final   Value: NO GROWTH     Performed at [facility]   Culture     Final   Value: NO GROWTH     Performed at [facility]   Report Status 03/07/2014 FINAL   Final  CULTURE, BLOOD (ROUTINE X 2)     Status: None   Collection Time    03/07/14  9:50 AM      Result Value Ref Range Status   Specimen Description BLOOD RIGHT HAND   Final   Special Requests BOTTLES DRAWN AEROBIC AND ANAEROBIC 10CC   Final   Culture  Setup Time     Final   Value: 03/07/2014 16:05     Performed at [facility]   Culture     Final   Value:        BLOOD CULTURE RECEIVED NO GROWTH TO DATE CULTURE WILL BE HELD FOR 5 DAYS BEFORE ISSUING A FINAL NEGATIVE REPORT     Performed at [facility]   Report Status PENDING   Incomplete  CULTURE, BLOOD (ROUTINE X 2)     Status: None   Collection Time    03/07/14 12:10 PM      Result Value Ref Range Status   Specimen Description BLOOD LEFT NECK   Final   Special Requests BOTTLES DRAWN AEROBIC AND ANAEROBIC 10CC IJ CVC   Final   Culture  Setup Time      Final   Value: 03/07/2014 18:53     Performed at [facility]   Culture     Final   Value:        BLOOD CULTURE RECEIVED NO GROWTH TO DATE CULTURE WILL  BE HELD FOR 5 DAYS BEFORE ISSUING A FINAL NEGATIVE REPORT     Performed at [facility]   Report Status PENDING   Incomplete    Studies/Results: Dg Chest Port 1 View  03/08/2014   CLINICAL DATA:  Evaluate endotracheal tube position  EXAM: PORTABLE CHEST - 1 VIEW  COMPARISON:  Portable chest x-ray of 03/07/2014  FINDINGS: There has been worsening of airspace disease bilaterally which is primarily perihilar in location most consistent with pulmonary edema and probable effusions. Mild cardiomegaly is stable. The tip of the endotracheal tube is approximately 2.8 cm above the carina. Left IJ central venous catheter remains unchanged in position  IMPRESSION: 1. Worsening of pulmonary edema with probable effusions. 2. Tip of endotracheal tube approximately 2.8 cm above the carina.   Electronically Signed   By: Dwyane Dee M.D.   On: 03/08/2014 08:08   Portable Chest Xray In Am  03/07/2014   CLINICAL DATA:  Hypoxia  EXAM: PORTABLE CHEST - 1 VIEW  COMPARISON:  March 06, 2014  FINDINGS: Endotracheal tube tip is 2.5 cm above the carina. Central catheter tip is at the junction of the left innominate vein and superior vena cava. Nasogastric tube tip and side port are below the diaphragm. No pneumothorax. There is interstitial edema with cardiomegaly and pulmonary venous hypertension. There is a right effusion. There is a minimal left effusion as well. There is mild airspace consolidation in the left base. No adenopathy.  IMPRESSION: Tube and catheter positions as described without pneumothorax. Evidence of a degree of congestive heart failure. Question alveolar edema versus superimposed pneumonia left base.   Electronically Signed   By: Bretta Bang M.D.   On: 03/07/2014 07:23   Dg Chest Port 1 View  03/06/2014   CLINICAL DATA:   Endotracheal tube and central line placement.  EXAM: PORTABLE CHEST - 1 VIEW  COMPARISON:  Same day.  FINDINGS: Endotracheal tube is in grossly good position with distal tip 4 cm above the carina. Left internal jugular catheter line is noted with distal tip in expected position of the SVC. No pneumothorax is noted. Stable bilateral perihilar and basilar opacities are noted concerning for edema or pneumonia.  IMPRESSION: No pneumothorax status post left internal jugular catheter line placement. Endotracheal tube in grossly good position. These results will be called to the ordering clinician or representative by the Radiologist Assistant, and communication documented in the PACS or zVision Dashboard.   Electronically Signed   By: Roque Lias M.D.   On: 03/06/2014 13:02   Dg Abd Portable 1v  03/06/2014   CLINICAL DATA:  NG tube placement.  EXAM: PORTABLE ABDOMEN - 1 VIEW  COMPARISON:  03/06/2014  FINDINGS: The endotracheal tube is 2.7 cm above the carina. The left IJ catheter tip is in the mid SVC. The NG tube tip is in the stomach. Persistent edema and right lower lobe airspace process.  IMPRESSION: The NG tube is in the stomach.  ETT is 2.7 cm above the carina.  Left IJ catheter tip is in the mid SVC.   Electronically Signed   By: Loralie Champagne M.D.   On: 03/06/2014 15:43     Assessment/Plan: Staph bacteremia  PVD  R heel ulcer- Cx pseudomonas  NSTEMI  DM2  Tobacco use  Total days of antibiotics: 3  9/13 vanco  9/13 cefepime   9/15 cefazolin 9/15   Await repeat BCx For TEE today.  Perfusion improved, wbc slightly better, possible vent wean.  Johny Sax Infectious Diseases (pager) [phone] [email or web address] 03/08/2014, 8:38 AM  LOS: 3 days

## 2014-03-09 ENCOUNTER — Inpatient Hospital Stay (HOSPITAL_COMMUNITY): Payer: BC Managed Care – PPO

## 2014-03-09 DIAGNOSIS — L98499 Non-pressure chronic ulcer of skin of other sites with unspecified severity: Secondary | ICD-10-CM

## 2014-03-09 DIAGNOSIS — I739 Peripheral vascular disease, unspecified: Secondary | ICD-10-CM

## 2014-03-09 LAB — BLOOD GAS, ARTERIAL
Acid-Base Excess: 0.6 mmol/L (ref 0.0–2.0)
Bicarbonate: 26.3 mEq/L — ABNORMAL HIGH (ref 20.0–24.0)
DRAWN BY: 24486
FIO2: 0.6 %
LHR: 14 {breaths}/min
O2 Saturation: 98.5 %
PEEP/CPAP: 5 cmH2O
PH ART: 7.304 — AB (ref 7.350–7.450)
Patient temperature: 98.6
TCO2: 27.9 mmol/L (ref 0–100)
VT: 500 mL
pCO2 arterial: 54.6 mmHg — ABNORMAL HIGH (ref 35.0–45.0)
pO2, Arterial: 124 mmHg — ABNORMAL HIGH (ref 80.0–100.0)

## 2014-03-09 LAB — BASIC METABOLIC PANEL
Anion gap: 11 (ref 5–15)
BUN: 11 mg/dL (ref 6–23)
CHLORIDE: 103 meq/L (ref 96–112)
CO2: 26 mEq/L (ref 19–32)
Calcium: 8 mg/dL — ABNORMAL LOW (ref 8.4–10.5)
Creatinine, Ser: 0.44 mg/dL — ABNORMAL LOW (ref 0.50–1.10)
GFR calc non Af Amer: >90 mL/min (ref >90)
Glucose, Bld: 153 mg/dL — ABNORMAL HIGH (ref 70–99)
Potassium: 3.4 mEq/L — ABNORMAL LOW (ref 3.7–5.3)
Sodium: 140 mEq/L (ref 137–147)

## 2014-03-09 LAB — CBC
HEMATOCRIT: 26.5 % — AB (ref 36.0–46.0)
Hemoglobin: 8.3 g/dL — ABNORMAL LOW (ref 12.0–15.0)
MCH: 22 pg — ABNORMAL LOW (ref 26.0–34.0)
MCHC: 31.3 g/dL (ref 30.0–36.0)
MCV: 70.1 fL — ABNORMAL LOW (ref 78.0–100.0)
Platelets: 229 10*3/uL (ref 150–400)
RBC: 3.78 MIL/uL — ABNORMAL LOW (ref 3.87–5.11)
RDW: 18.5 % — ABNORMAL HIGH (ref 11.5–15.5)
WBC: 8.4 10*3/uL (ref 4.0–10.5)

## 2014-03-09 LAB — GLUCOSE, CAPILLARY
GLUCOSE-CAPILLARY: 168 mg/dL — AB (ref 70–99)
GLUCOSE-CAPILLARY: 227 mg/dL — AB (ref 70–99)
Glucose-Capillary: 158 mg/dL — ABNORMAL HIGH (ref 70–99)
Glucose-Capillary: 197 mg/dL — ABNORMAL HIGH (ref 70–99)
Glucose-Capillary: 306 mg/dL — ABNORMAL HIGH (ref 70–99)
Glucose-Capillary: 360 mg/dL — ABNORMAL HIGH (ref 70–99)

## 2014-03-09 LAB — PHOSPHORUS: Phosphorus: 2.6 mg/dL (ref 2.3–4.6)

## 2014-03-09 LAB — MAGNESIUM: Magnesium: 1.9 mg/dL (ref 1.5–2.5)

## 2014-03-09 MED ORDER — METHYLPREDNISOLONE SODIUM SUCC 125 MG IJ SOLR
80.0000 mg | Freq: Four times a day (QID) | INTRAMUSCULAR | Status: DC
  Administered 2014-03-09 (×2): 80 mg via INTRAVENOUS
  Filled 2014-03-09: qty 2
  Filled 2014-03-09 (×4): qty 1.28

## 2014-03-09 MED ORDER — FUROSEMIDE 10 MG/ML IJ SOLN
80.0000 mg | Freq: Once | INTRAMUSCULAR | Status: AC
  Administered 2014-03-09: 80 mg via INTRAVENOUS
  Filled 2014-03-09: qty 8

## 2014-03-09 MED ORDER — METHYLPREDNISOLONE SODIUM SUCC 125 MG IJ SOLR
80.0000 mg | Freq: Four times a day (QID) | INTRAMUSCULAR | Status: DC
  Administered 2014-03-10 – 2014-03-11 (×6): 80 mg via INTRAVENOUS
  Filled 2014-03-09 (×8): qty 1.28
  Filled 2014-03-09: qty 2
  Filled 2014-03-09 (×2): qty 1.28

## 2014-03-09 MED ORDER — FUROSEMIDE 10 MG/ML IJ SOLN
20.0000 mg | Freq: Three times a day (TID) | INTRAMUSCULAR | Status: DC
  Administered 2014-03-09 – 2014-03-10 (×2): 20 mg via INTRAVENOUS
  Filled 2014-03-09 (×5): qty 2

## 2014-03-09 MED ORDER — SODIUM PHOSPHATE 3 MMOLE/ML IV SOLN
30.0000 mmol | Freq: Once | INTRAVENOUS | Status: AC
  Administered 2014-03-09: 30 mmol via INTRAVENOUS
  Filled 2014-03-09: qty 10

## 2014-03-09 MED ORDER — ALBUTEROL SULFATE (2.5 MG/3ML) 0.083% IN NEBU
2.5000 mg | INHALATION_SOLUTION | RESPIRATORY_TRACT | Status: DC | PRN
  Administered 2014-03-09: 2.5 mg via RESPIRATORY_TRACT

## 2014-03-09 MED ORDER — HEPARIN SODIUM (PORCINE) 5000 UNIT/ML IJ SOLN
5000.0000 [IU] | Freq: Three times a day (TID) | INTRAMUSCULAR | Status: DC
  Administered 2014-03-09 – 2014-03-10 (×3): 5000 [IU] via SUBCUTANEOUS
  Filled 2014-03-09 (×5): qty 1

## 2014-03-09 MED ORDER — ALBUTEROL SULFATE (2.5 MG/3ML) 0.083% IN NEBU
INHALATION_SOLUTION | RESPIRATORY_TRACT | Status: AC
  Filled 2014-03-09: qty 3

## 2014-03-09 MED ORDER — POTASSIUM CHLORIDE 10 MEQ/50ML IV SOLN
10.0000 meq | INTRAVENOUS | Status: AC
  Administered 2014-03-09 (×2): 10 meq via INTRAVENOUS
  Filled 2014-03-09: qty 50

## 2014-03-09 MED ORDER — MAGNESIUM SULFATE 40 MG/ML IJ SOLN
2.0000 g | Freq: Once | INTRAMUSCULAR | Status: AC
  Administered 2014-03-09: 2 g via INTRAVENOUS
  Filled 2014-03-09: qty 50

## 2014-03-09 NOTE — Progress Notes (Signed)
Subjective: Intubated and alert.  Answers questions.    Objective: Vital signs in last 24 hours: Temp:  [97.7 F (36.5 C)-98.7 F (37.1 C)] 98.7 F (37.1 C) (09/17 0830) Pulse Rate:  [81-126] 103 (09/17 0700) Resp:  [0-21] 13 (09/17 0700) BP: (86-137)/(39-87) 109/66 mmHg (09/17 0700) SpO2:  [92 %-100 %] 92 % (09/17 1017) FiO2 (%):  [40 %] 40 % (09/17 0850) Weight:  [219 lb 12.8 oz (99.7 kg)] 219 lb 12.8 oz (99.7 kg) (09/17 0600) Last BM Date: 03/06/14  Intake/Output from previous day: 09/16 0701 - 09/17 0700 In: 2221.1 [I.V.:705.6; NG/GT:405.5; IV Piggyback:1110] Out: 4360 [Urine:4360] Intake/Output this shift:    Medications Current Facility-Administered Medications  Medication Dose Route Frequency Provider Last Rate Last Dose  . 0.9 %  sodium chloride infusion   Intravenous Continuous PRN Nelda Bucks, MD 10 mL/hr at 03/09/14 0000    . acetaminophen (TYLENOL) tablet 650 mg  650 mg Oral Q6H PRN Eduard Clos, MD   650 mg at 03/06/14 0145   Or  . acetaminophen (TYLENOL) suppository 650 mg  650 mg Rectal Q6H PRN Eduard Clos, MD      . albuterol (PROVENTIL) (2.5 MG/3ML) 0.083% nebulizer solution 2.5 mg  2.5 mg Nebulization Q3H PRN Nelda Bucks, MD   2.5 mg at 03/09/14 1015  . albuterol (PROVENTIL) (2.5 MG/3ML) 0.083% nebulizer solution           . antiseptic oral rinse (CPC / CETYLPYRIDINIUM CHLORIDE 0.05%) solution 7 mL  7 mL Mouth Rinse QID Lupita Leash, MD   7 mL at 03/09/14 0400  . aspirin chewable tablet 81 mg  81 mg Per Tube Daily Lupita Leash, MD   81 mg at 03/09/14 0954  . atorvastatin (LIPITOR) tablet 40 mg  40 mg Per Tube q1800 Lupita Leash, MD   40 mg at 03/08/14 1728  . ceFEPIme (MAXIPIME) 2 g in dextrose 5 % 50 mL IVPB  2 g Intravenous Q12H Ginnie Smart, MD   2 g at 03/09/14 0954  . chlorhexidine (PERIDEX) 0.12 % solution 15 mL  15 mL Mouth Rinse BID Lupita Leash, MD   15 mL at 03/09/14 0749  . darifenacin  (ENABLEX) 24 hr tablet 7.5 mg  7.5 mg Oral Daily Eduard Clos, MD   7.5 mg at 03/09/14 0954  . escitalopram (LEXAPRO) tablet 10 mg  10 mg Per Tube Daily Lupita Leash, MD   10 mg at 03/09/14 0954  . famotidine (PEPCID) IVPB 20 mg  20 mg Intravenous Q12H Lupita Leash, MD   20 mg at 03/09/14 0954  . feeding supplement (VITAL HIGH PROTEIN) liquid 1,000 mL  1,000 mL Per Tube Q24H Hettie Holstein, RD      . fentaNYL (SUBLIMAZE) 2,500 mcg in sodium chloride 0.9 % 250 mL (10 mcg/mL) infusion  0-400 mcg/hr Intravenous Continuous Lupita Leash, MD 30 mL/hr at 03/09/14 1020 300 mcg/hr at 03/09/14 1020  . fentaNYL (SUBLIMAZE) bolus via infusion 50-100 mcg  50-100 mcg Intravenous Q1H PRN Lupita Leash, MD      . fentaNYL (SUBLIMAZE) injection 50 mcg  50 mcg Intravenous Q2H PRN Eduard Clos, MD      . fluticasone (FLONASE) 50 MCG/ACT nasal spray 1 spray  1 spray Each Nare Daily PRN Eduard Clos, MD      . furosemide (LASIX) injection 20 mg  20 mg Intravenous 3 times per day Nelda Bucks,  MD   20 mg at 03/09/14 0354  . insulin aspart (novoLOG) injection 0-20 Units  0-20 Units Subcutaneous 6 times per day Nelda Bucks, MD   4 Units at 03/09/14 0805  . ipratropium-albuterol (DUONEB) 0.5-2.5 (3) MG/3ML nebulizer solution 3 mL  3 mL Nebulization Q6H Lupita Leash, MD   3 mL at 03/09/14 0744  . methylPREDNISolone sodium succinate (SOLU-MEDROL) 125 mg/2 mL injection 80 mg  80 mg Intravenous Q6H Nelda Bucks, MD      . ondansetron [facility]) tablet 4 mg  4 mg Oral Q6H PRN Eduard Clos, MD       Or  . ondansetron [facility]) injection 4 mg  4 mg Intravenous Q6H PRN Eduard Clos, MD      . oxyCODONE (Oxy IR/ROXICODONE) immediate release tablet 30 mg  30 mg Oral Q4H PRN Eduard Clos, MD      . phenylephrine (NEO-SYNEPHRINE) 40 mg in dextrose 5 % 250 mL (0.16 mg/mL) infusion  30-200 mcg/min Intravenous Continuous Lupita Leash, MD   25  mcg/min at 03/08/14 0436  . sodium phosphate 30 mmol in dextrose 5 % 250 mL infusion  30 mmol Intravenous Once Nelda Bucks, MD        PE: General appearance: alert, cooperative and no distress Lungs: Bilateral wheeze Heart: Reg rhythm.  Rate fast.  No MM Abdomen: +BS, soft nontender.  Extremities: Trace LEE Pulses: 2+ radials.  0 distal pulses Skin: mildly diaphoretic.  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 NSTEMI  Last troponin 16.99  echo shows EF 30s, takotsuko vs LAD disease  plan for L +/- R heart cath once stable once off pressor and extubated  Heparin DCd 2. Acute respiratory failure  COPD and PNA with staph bacteremia.   ID consulted, possible extubation today or tomorrow  CXR today:  Stable to slightly improved appearance of the pulmonary interstitial edema.  3. Acute systolic HF  Net fluids: -2.1L/+5.4L.  Echo 03/06/2014 EF 30-35%, anterior, anteroseptal, apical and inferoapical hypokinesis, ?LAD disease vs Takotsubo's cardiomyopathy, grade 1 diastolic dysfunction . Lasix 20 IV TID (-2L out) 4. H/o CAD s/p RCA stent 2000  - previously followed by Dr. Dickie La before he retired  5. sepsis with elevated HR and WBC along with hypotension  SP TEE with no evidence of endocarditis.  Mild MR. EF estimated at 35-40%. Anterior, anteroseptal, apical and inferoapical hypokinesis.  VSS, pressors  stopped. ID following 6. PVD s/p R bypass  7. DM2  8. Obesity 9. Left leg pain s/p recent vascular access  10. Microcytosis, ?iron deficient  11. Tobacco abuse    LOS: 4 days    HAGER, BRYAN PA-C 03/09/2014 11:30 AM  Personally seen and examined. Agree with above.  Lasix as BP tolerates.  EF  35% TEE negative. Off heparin IV ASA, STATIN.  No Bb with recent shock Cath when able.  Remains critically ill. Spoke with family. Sister.   Donato Schultz, MD

## 2014-03-09 NOTE — Progress Notes (Signed)
PULMONARY / CRITICAL CARE MEDICINE   Name: [name] MRN: [id number] DOB: September 29, 1955    ADMISSION DATE:  03/05/2014 CONSULTATION DATE:  9/14  REFERRING MD :  [facility]  CHIEF COMPLAINT: Left leg pain  INITIAL PRESENTATION: Left leg pain, hypotension and hypoxic.  STUDIES:  9/14  Echo- EF 30-35% 9/16  TEE- no LAA thrombus, no valvular vegetation, anterior, anteroseptal, apical, and inferioapical hypokinesis, EF 35%  SIGNIFICANT EVENTS: 9/14 hypoxia, hypotension . ++ Trop I 9/14 intubated 9/15 pos BC MSSA  9/16 TEE>>> see above 9/17- neg 2 liters, still bronchospastic  SUBJECTIVE: remains on vent, c/o stomach discomfort, severe bronchospasm, distress  VITAL SIGNS: Temp:  [97.7 F (36.5 C)-98.3 F (36.8 C)] 97.7 F (36.5 C) (09/17 0355) Pulse Rate:  [81-126] 103 (09/17 0700) Resp:  [0-21] 13 (09/17 0700) BP: (86-137)/(39-87) 109/66 mmHg (09/17 0700) SpO2:  [96 %-100 %] 98 % (09/17 0751) FiO2 (%):  [40 %] 40 % (09/17 0752) Weight:  [219 lb 12.8 oz (99.7 kg)] 219 lb 12.8 oz (99.7 kg) (09/17 0600) HEMODYNAMICS:   VENTILATOR SETTINGS: Vent Mode:  [-] CPAP;PSV FiO2 (%):  [40 %] 40 % Set Rate:  [14 bmp] 14 bmp Vt Set:  [500 mL] 500 mL PEEP:  [5 cmH20] 5 cmH20 Pressure Support:  [5 cmH20-8 cmH20] 5 cmH20 Plateau Pressure:  [19 cmH20-24 cmH20] 20 cmH20 INTAKE / OUTPUT:  Intake/Output Summary (Last 24 hours) at 03/09/14 0816 Last data filed at 03/09/14 0700  Gross per 24 hour  Intake 2188.58 ml  Output   4260 ml  Net -2071.42 ml    PHYSICAL EXAMINATION: General:  Obese female on vent, NAD Neuro:  rass +2 HEENT:  JVD Cardiovascular: s1 s2, tachycardic on wean, HR decreased once back on vent support and resedated Lungs:  Ronchi, mod wheezing diffusse insp exp Abdomen:  nonTender, large ecchymotic area left femoral area and into abd. Hypo BS Musculoskeletal:  Lower ext PVD changes with multiple healing ulcers. Pulses palp,  Skin: Ulcers as noted, dry, R foot cool  to touch, L warm and edematous  LABS:  CBC  Recent Labs Lab 03/08/14 0501 03/08/14 1130 03/09/14 0410  WBC 15.2* 10.6* 8.4  HGB 8.9* 8.7* 8.3*  HCT 28.3* 27.7* 26.5*  PLT 284 231 229   Coag's  Recent Labs Lab 03/06/14 1300  INR 1.45   BMET  Recent Labs Lab 03/06/14 0415 03/08/14 0501 03/09/14 0410  NA 132* 134* 140  K 3.4* 3.9 3.4*  CL 99 100 103  CO2 $Re'19 21 26  'Asu$ BUN $R'10 11 11  'sg$ CREATININE 0.47* 0.41* 0.44*  GLUCOSE 175* 220* 153*   Electrolytes  Recent Labs Lab 03/06/14 0415  03/07/14 1839 03/08/14 0501 03/09/14 0410  CALCIUM 7.2*  --   --  7.9* 8.0*  MG  --   < > 2.2 2.2 1.9  PHOS  --   < > 2.5 1.7* 2.6  < > = values in this interval not displayed. Sepsis Markers  Recent Labs Lab 03/05/14 2308 03/06/14 0415  03/06/14 1400 03/06/14 1938 03/07/14 0500 03/08/14 0501  LATICACIDVEN 1.5 0.8  --  1.1  --   --   --   PROCALCITON  --   --   < >  --  1.59 1.34 0.79  < > = values in this interval not displayed. ABG  Recent Labs Lab 03/06/14 1646 03/07/14 0936 03/08/14 0500  PHART 7.300* 7.313* 7.346*  PCO2ART 41.5 40.3 40.2  PO2ART 80.0 76.0* 114.0*   Liver  Enzymes  Recent Labs Lab 03/05/14 1650 03/06/14 0415 03/08/14 0501  AST 31 41* 41*  ALT 11 15 50*  ALKPHOS 78 64 72  BILITOT 0.7 0.6 0.3  ALBUMIN 3.0* 2.4* 2.2*   Cardiac Enzymes  Recent Labs Lab 03/06/14 0415 03/06/14 1015 03/06/14 1718  TROPONINI 6.05* 10.57* 16.99*  PROBNP  --  8842.0*  --    Glucose  Recent Labs Lab 03/08/14 1149 03/08/14 1547 03/08/14 1915 03/09/14 0018 03/09/14 0356 03/09/14 0752  GLUCAP 174* 200* 192* 168* 158* 197*    Imaging Dg Chest Port 1 View  03/08/2014   CLINICAL DATA:  Evaluate endotracheal tube position  EXAM: PORTABLE CHEST - 1 VIEW  COMPARISON:  Portable chest x-ray of 03/07/2014  FINDINGS: There has been worsening of airspace disease bilaterally which is primarily perihilar in location most consistent with pulmonary edema and  probable effusions. Mild cardiomegaly is stable. The tip of the endotracheal tube is approximately 2.8 cm above the carina. Left IJ central venous catheter remains unchanged in position  IMPRESSION: 1. Worsening of pulmonary edema with probable effusions. 2. Tip of endotracheal tube approximately 2.8 cm above the carina.   Electronically Signed   By: Ivar Drape M.D.   On: 03/08/2014 08:08   Dg Abd Portable 1v  03/08/2014   CLINICAL DATA:  Orogastric tube placement  EXAM: PORTABLE ABDOMEN - 1 VIEW  COMPARISON:  None.  FINDINGS: Orogastric tube tip and side port are in the stomach. There is contrast in the colon. The bowel gas pattern is unremarkable. No obstruction or free air appreciable.  IMPRESSION: Orogastric tube tip and side port are in the stomach. Bowel gas pattern unremarkable.   Electronically Signed   By: Lowella Grip M.D.   On: 03/08/2014 16:09     ASSESSMENT / PLAN:  PULMONARY OETT>>9/14>> A: Hypoxia - pulm edema- improved on pcxr Tobacco abuse HCAP (evolving infiltrate RLL) AE COPD? > Emphysema on CT scan, no prior diagnosis of COPD COPD exac likely and edema contribution to bronchospasm P:   BD scheduled and PRN, increased frequency STAT ABG Solumedrol re increased to 80 q6h pcxr in am and stat SBT initiated 9/16 - failed horribly today 17th, hold further Requires neg balance, cont lasix to neg baalnce  CARDIOVASCULAR CVL>>9/14>> A:  NSTEMI>+ trop I 6.5 9/14 incidental finding. Septic shock- resolving R/o endocarditis-neg per TEE P:  Cards is managing. Hx of CAD + stent 15 years ago D/C Heparin per Cards, add sub q hep Plan for cath when stable, if continues to have worsening edema - may need cath earlier Lasix, lasix, lasix  RENAL A:   Hypo K, hypomag hypo phos- improved pulm edema P:   KCl 72mEq qh x 2 per [facility]  Replace phos now iv Saline maintain - kvo cvp assessment -dc Lasix cont to goal neg 2 liters, she is off pressors    GASTROINTESTINAL A:   feeds P:   pepcid TF to remain  HEMATOLOGIC A:   Chronic anemia L inguinal hematoma Some hemoconcetartion P:  transfuse per protocol for hgb <7.0 Monitor left groin closely Cbc in am D/C hep per cards, add sub q heparin   INFECTIOUS A:  Bacteremia, related to recent angio? Vascular in nature Septic shock -improving R/o HCAP  P:   BCx2  9/14>>2/2 Staph aureus>>> UC  9/14>>negative WC 9/13>>pseudomonas Sputum Abx: Vanc  , start date 9/14>>> Abx: cefepime, start date 9/14>>> Ancef 9/15>>>9/15 Repeat BC  ENDOCRINE A:   DM last Hbg a1c  7.2 NICE goal 140-180, met P:   To res SSI lantus likely after tf started  NEUROLOGIC A:  Vent dyschrony P:   fent rass goal may need to be deeper  TODAY'S SUMMARY:  Lasix continued, bronchospasm, steroids increased, may need earlier cath, avoid with mssa as able  I have personally obtained a history, examined the patient, evaluated laboratory and imaging results, formulated the assessment and plan and placed orders. CRITICAL CARE: The patient is critically ill with multiple organ systems failure and requires high complexity decision making for assessment and support, frequent evaluation and titration of therapies, application of advanced monitoring technologies and extensive interpretation of multiple databases. Critical Care Time devoted to patient care services described in this note is 30 minutes.   Magdalene River, PA-S  [place] Titus Mould, MD, [place] Pgr: [facility]

## 2014-03-09 NOTE — Progress Notes (Signed)
   RN calling   - sudden resp distress, wheeze, agirtation and tachycardia  Plan Stat cxr Stat ekg Stat lasix  IV x 1   Dr. Kalman Shan, M.D., [facility].C.P Pulmonary and Critical Care Medicine Staff Physician [facility] [place] [facility] Pager: [phone], If no answer or between  15:00h - 7:00h: call [phone]  03/09/2014 6:53 PM

## 2014-03-09 NOTE — Progress Notes (Signed)
INFECTIOUS DISEASE PROGRESS NOTE  ID: [name] is a 58 y.o. female with  Principal Problem:   Sepsis Active Problems:   Left leg pain   Anemia   Diabetes mellitus, controlled  Subjective: Awake, uncomfortable on vent  Abtx:  Anti-infectives   Start     Dose/Rate Route Frequency Ordered Stop   03/07/14 1200  ceFEPIme (MAXIPIME) 2 g in dextrose 5 % 50 mL IVPB     2 g 100 mL/hr over 30 Minutes Intravenous Every 12 hours 03/07/14 1108     03/07/14 0930  ceFAZolin (ANCEF) IVPB 2 g/50 mL premix  Status:  Discontinued     2 g 100 mL/hr over 30 Minutes Intravenous 3 times per day 03/07/14 0853 03/07/14 1108   03/06/14 0400  ceFEPIme (MAXIPIME) 1 g in dextrose 5 % 50 mL IVPB  Status:  Discontinued     1 g 100 mL/hr over 30 Minutes Intravenous Every 8 hours 03/05/14 1832 03/07/14 0849   03/06/14 0400  vancomycin (VANCOCIN) IVPB 1000 mg/200 mL premix     1,000 mg 200 mL/hr over 60 Minutes Intravenous Every 8 hours 03/05/14 1839     03/05/14 1845  ceFEPIme (MAXIPIME) 1 g in dextrose 5 % 50 mL IVPB     1 g 100 mL/hr over 30 Minutes Intravenous  Once 03/05/14 1832 03/05/14 2014   03/05/14 1845  vancomycin (VANCOCIN) 500 mg in sodium chloride 0.9 % 100 mL IVPB  Status:  Discontinued     500 mg 100 mL/hr over 60 Minutes Intravenous  Once 03/05/14 1839 03/07/14 0854   03/05/14 1830  vancomycin (VANCOCIN) IVPB 1000 mg/200 mL premix     1,000 mg 200 mL/hr over 60 Minutes Intravenous  Once 03/05/14 1820 03/05/14 2121      Medications:  Scheduled: . albuterol      . antiseptic oral rinse  7 mL Mouth Rinse QID  . aspirin  81 mg Per Tube Daily  . atorvastatin  40 mg Per Tube q1800  . ceFEPime (MAXIPIME) IV  2 g Intravenous Q12H  . chlorhexidine  15 mL Mouth Rinse BID  . darifenacin  7.5 mg Oral Daily  . escitalopram  10 mg Per Tube Daily  . famotidine (PEPCID) IV  20 mg Intravenous Q12H  . feeding supplement (VITAL HIGH PROTEIN)  1,000 mL Per Tube Q24H  . furosemide  20 mg  Intravenous 3 times per day  . insulin aspart  0-20 Units Subcutaneous 6 times per day  . ipratropium-albuterol  3 mL Nebulization Q6H  . methylPREDNISolone (SOLU-MEDROL) injection  80 mg Intravenous Q6H  . sodium phosphate  Dextrose 5% IVPB  30 mmol Intravenous Once  . vancomycin  1,000 mg Intravenous Q8H    Objective: Vital signs in last 24 hours: Temp:  [97.7 F (36.5 C)-98.7 F (37.1 C)] 98.7 F (37.1 C) (09/17 0830) Pulse Rate:  [81-126] 103 (09/17 0700) Resp:  [0-21] 13 (09/17 0700) BP: (86-137)/(39-87) 109/66 mmHg (09/17 0700) SpO2:  [92 %-100 %] 92 % (09/17 1017) FiO2 (%):  [40 %] 40 % (09/17 0850) Weight:  [99.7 kg (219 lb 12.8 oz)] 99.7 kg (219 lb 12.8 oz) (09/17 0600)   General appearance: alert and moderate distress Resp: rhonchi anterior - bilateral Cardio: regular rate and rhythm GI: normal findings: bowel sounds normal and soft, non-tender Extremities: R ankle medial ulcer. minimal d/c. no erythema.   Lab Results  Recent Labs  03/08/14 0501 03/08/14 1130 03/09/14 0410  WBC 15.2* 10.6*  8.4  HGB 8.9* 8.7* 8.3*  HCT 28.3* 27.7* 26.5*  NA 134*  --  140  K 3.9  --  3.4*  CL 100  --  103  CO2 21  --  26  BUN 11  --  11  CREATININE 0.41*  --  0.44*   Liver Panel  Recent Labs  03/08/14 0501  PROT 6.0  ALBUMIN 2.2*  AST 41*  ALT 50*  ALKPHOS 72  BILITOT 0.3   Sedimentation Rate No results found for this basename: ESRSEDRATE,  in the last 72 hours C-Reactive Protein No results found for this basename: CRP,  in the last 72 hours  Microbiology: Recent Results (from the past 240 hour(s))  URINE CULTURE     Status: None   Collection Time    03/05/14  5:05 PM      Result Value Ref Range Status   Specimen Description URINE, RANDOM   Final   Special Requests NONE   Final   Culture  Setup Time     Final   Value: 03/05/2014 18:00     Performed at [facility] Count     Final   Value: NO GROWTH     Performed at [facility]   Culture     Final   Value: NO GROWTH     Performed at [facility]   Report Status 03/07/2014 FINAL   Final  CULTURE, BLOOD (ROUTINE X 2)     Status: None   Collection Time    03/05/14  5:12 PM      Result Value Ref Range Status   Specimen Description BLOOD RIGHT ARM   Final   Special Requests BOTTLES DRAWN AEROBIC AND ANAEROBIC 7CC EA   Final   Culture  Setup Time     Final   Value: 03/06/2014 00:49     Performed at [facility]   Culture     Final   Value: STAPHYLOCOCCUS AUREUS     Note: RIFAMPIN AND GENTAMICIN SHOULD NOT BE USED AS SINGLE DRUGS FOR TREATMENT OF STAPH INFECTIONS.     Note: Gram Stain Report Called to,Read Back By and Verified With: THERESA@1 :30PM ON 03/06/14 BY DANTS     Performed at [facility]   Report Status 03/08/2014 FINAL   Final   Organism ID, Bacteria STAPHYLOCOCCUS AUREUS   Final  CULTURE, BLOOD (ROUTINE X 2)     Status: None   Collection Time    03/05/14  6:05 PM      Result Value Ref Range Status   Specimen Description BLOOD RIGHT FOREARM   Final   Special Requests BOTTLES DRAWN AEROBIC AND ANAEROBIC 10CC EA   Final   Culture  Setup Time     Final   Value: 03/06/2014 00:49     Performed at [facility]   Culture     Final   Value: STAPHYLOCOCCUS AUREUS     Note: SUSCEPTIBILITIES PERFORMED ON PREVIOUS CULTURE WITHIN THE LAST 5 DAYS.     Note: Gram Stain Report Called to,Read Back By and Verified With: THERESA@1 :30PM ON 03/06/14 BY DANTS     Performed at [facility]   Report Status 03/08/2014 FINAL   Final  MRSA PCR SCREENING     Status: None   Collection Time    03/05/14 10:28 PM      Result Value Ref Range Status   MRSA by PCR NEGATIVE  NEGATIVE Final  Comment:            The GeneXpert MRSA Assay (FDA     approved for NASAL specimens     only), is one component of a     comprehensive MRSA colonization     surveillance program. It is not     intended to diagnose MRSA     infection nor to  guide or     monitor treatment for     MRSA infections.  WOUND CULTURE     Status: None   Collection Time    03/05/14 11:52 PM      Result Value Ref Range Status   Specimen Description WOUND RIGHT GROIN   Final   Special Requests NONE   Final   Gram Stain     Final   Value: NO WBC SEEN     RARE SQUAMOUS EPITHELIAL CELLS PRESENT     MODERATE GRAM NEGATIVE RODS     Performed at [facility]   Culture     Final   Value: MODERATE PSEUDOMONAS AERUGINOSA     Performed at [facility]   Report Status 03/08/2014 FINAL   Final   Organism ID, Bacteria PSEUDOMONAS AERUGINOSA   Final  URINE CULTURE     Status: None   Collection Time    03/06/14  9:15 AM      Result Value Ref Range Status   Specimen Description URINE, CATHETERIZED   Final   Special Requests NONE   Final   Culture  Setup Time     Final   Value: 03/06/2014 13:06     Performed at [facility] Count     Final   Value: NO GROWTH     Performed at [facility]   Culture     Final   Value: NO GROWTH     Performed at [facility]   Report Status 03/07/2014 FINAL   Final  CULTURE, BLOOD (ROUTINE X 2)     Status: None   Collection Time    03/07/14  9:50 AM      Result Value Ref Range Status   Specimen Description BLOOD RIGHT HAND   Final   Special Requests BOTTLES DRAWN AEROBIC AND ANAEROBIC 10CC   Final   Culture  Setup Time     Final   Value: 03/07/2014 16:05     Performed at [facility]   Culture     Final   Value:        BLOOD CULTURE RECEIVED NO GROWTH TO DATE CULTURE WILL BE HELD FOR 5 DAYS BEFORE ISSUING A FINAL NEGATIVE REPORT     Performed at [facility]   Report Status PENDING   Incomplete  CULTURE, BLOOD (ROUTINE X 2)     Status: None   Collection Time    03/07/14 12:10 PM      Result Value Ref Range Status   Specimen Description BLOOD LEFT NECK   Final   Special Requests BOTTLES DRAWN AEROBIC AND ANAEROBIC 10CC IJ CVC   Final   Culture  Setup  Time     Final   Value: 03/07/2014 18:53     Performed at [facility]   Culture     Final   Value:        BLOOD CULTURE RECEIVED NO GROWTH TO DATE CULTURE WILL BE HELD FOR 5 DAYS BEFORE ISSUING A FINAL NEGATIVE REPORT     Performed  at [facility]   Report Status PENDING   Incomplete    Studies/Results: Dg Chest Port 1 View  03/09/2014   CLINICAL DATA:  Reassess known airspace disease and support tube positioning  EXAM: PORTABLE CHEST - 1 VIEW  COMPARISON:  Portable chest x-ray of 08 March 2014  FINDINGS: The lungs are adequately inflated. Widespread airspace consolidation is present but may have slightly improved since yesterday's study. The hemidiaphragms are obscured. There is no pneumothorax. The cardiac silhouette is mildly enlarged. The pulmonary vascularity is engorged.  The endotracheal tube tip lies 3 cm above the crotch of the carina. The left internal jugular venous catheter tip projects over the junction of the proximal and midportions of the SVC. The esophagogastric tube tip projects off the inferior margin of the image.  IMPRESSION: 1. Stable to slightly improved appearance of the pulmonary interstitial edema. 2. The support tubes and lines are in reasonable position.   Electronically Signed   By: David  [place]   On: 03/09/2014 07:50   Dg Chest Port 1 View  03/08/2014   CLINICAL DATA:  Evaluate endotracheal tube position  EXAM: PORTABLE CHEST - 1 VIEW  COMPARISON:  Portable chest x-ray of 03/07/2014  FINDINGS: There has been worsening of airspace disease bilaterally which is primarily perihilar in location most consistent with pulmonary edema and probable effusions. Mild cardiomegaly is stable. The tip of the endotracheal tube is approximately 2.8 cm above the carina. Left IJ central venous catheter remains unchanged in position  IMPRESSION: 1. Worsening of pulmonary edema with probable effusions. 2. Tip of endotracheal tube approximately 2.8 cm above the carina.    Electronically Signed   By: Dwyane Dee M.D.   On: 03/08/2014 08:08   Dg Abd Portable 1v  03/08/2014   CLINICAL DATA:  Orogastric tube placement  EXAM: PORTABLE ABDOMEN - 1 VIEW  COMPARISON:  None.  FINDINGS: Orogastric tube tip and side port are in the stomach. There is contrast in the colon. The bowel gas pattern is unremarkable. No obstruction or free air appreciable.  IMPRESSION: Orogastric tube tip and side port are in the stomach. Bowel gas pattern unremarkable.   Electronically Signed   By: Bretta Bang M.D.   On: 03/08/2014 16:09     Assessment/Plan: Staph bacteremia- MSSA  TEE (-)  Repeat BCx 9-15 pending PVD  R heel ulcer- Cx pseudomonas  NSTEMI  DM2  Tobacco use  Total days of antibiotics: 4  9/13 vanco  9/13 cefepime  9/15 cefazolin 9/15   Will stop vanco continue cefepime Await repeat BCx.         Johny Sax Infectious Diseases (pager) [phone] [email or web address] 03/09/2014, 10:39 AM  LOS: 4 days

## 2014-03-09 NOTE — Progress Notes (Signed)
[facility] ADULT ICU REPLACEMENT PROTOCOL FOR AM LAB REPLACEMENT ONLY  The patient does apply for the [facility] Adult ICU Electrolyte Replacment Protocol based on the criteria listed below:   1. Is GFR >/= 40 ml/min? Yes.    Patient's GFR today is >90 2. Is urine output >/= 0.5 ml/kg/hr for the last 6 hours? Yes.   Patient's UOP is 2.14 ml/kg/hr 3. Is BUN < 60 mg/dL? Yes.    Patient's BUN today is 11 4. Abnormal electrolyte(s): K+ 3.4 5. Ordered repletion with: see order 6. If a panic level lab has been reported, has the CCM MD in charge been notified? Yes.  .   Physician: PCCM  Ronal Fear A 03/09/2014 6:28 AM

## 2014-03-10 ENCOUNTER — Encounter: Payer: Self-pay | Admitting: Surgery

## 2014-03-10 ENCOUNTER — Inpatient Hospital Stay (HOSPITAL_COMMUNITY): Payer: BC Managed Care – PPO

## 2014-03-10 ENCOUNTER — Encounter (HOSPITAL_COMMUNITY): Payer: Self-pay | Admitting: Interventional Cardiology

## 2014-03-10 ENCOUNTER — Encounter (HOSPITAL_COMMUNITY)
Admission: EM | Disposition: A | Discharge status: Rehabilitation Facility | Payer: Self-pay | Source: Home / Self Care | Attending: Pulmonary Disease

## 2014-03-10 DIAGNOSIS — I214 Non-ST elevation (NSTEMI) myocardial infarction: Secondary | ICD-10-CM | POA: Diagnosis present

## 2014-03-10 DIAGNOSIS — B965 Pseudomonas (aeruginosa) (mallei) (pseudomallei) as the cause of diseases classified elsewhere: Secondary | ICD-10-CM

## 2014-03-10 DIAGNOSIS — I2510 Atherosclerotic heart disease of native coronary artery without angina pectoris: Secondary | ICD-10-CM

## 2014-03-10 DIAGNOSIS — L97409 Non-pressure chronic ulcer of unspecified heel and midfoot with unspecified severity: Secondary | ICD-10-CM

## 2014-03-10 HISTORY — PX: LEFT HEART CATHETERIZATION WITH CORONARY ANGIOGRAM: SHX5451

## 2014-03-10 LAB — GLUCOSE, CAPILLARY
GLUCOSE-CAPILLARY: 153 mg/dL — AB (ref 70–99)
GLUCOSE-CAPILLARY: 219 mg/dL — AB (ref 70–99)
GLUCOSE-CAPILLARY: 335 mg/dL — AB (ref 70–99)
GLUCOSE-CAPILLARY: 357 mg/dL — AB (ref 70–99)
Glucose-Capillary: 332 mg/dL — ABNORMAL HIGH (ref 70–99)
Glucose-Capillary: 319 mg/dL — ABNORMAL HIGH (ref 70–99)
Glucose-Capillary: 291 mg/dL — ABNORMAL HIGH (ref 70–99)
Glucose-Capillary: 231 mg/dL — ABNORMAL HIGH (ref 70–99)
Glucose-Capillary: 222 mg/dL — ABNORMAL HIGH (ref 70–99)
Glucose-Capillary: 159 mg/dL — ABNORMAL HIGH (ref 70–99)
Glucose-Capillary: 159 mg/dL — ABNORMAL HIGH (ref 70–99)
Glucose-Capillary: 159 mg/dL — ABNORMAL HIGH (ref 70–99)
Glucose-Capillary: 169 mg/dL — ABNORMAL HIGH (ref 70–99)
Glucose-Capillary: 178 mg/dL — ABNORMAL HIGH (ref 70–99)
Glucose-Capillary: 214 mg/dL — ABNORMAL HIGH (ref 70–99)

## 2014-03-10 LAB — BASIC METABOLIC PANEL
Anion gap: 11 (ref 5–15)
BUN: 17 mg/dL (ref 6–23)
CALCIUM: 8.4 mg/dL (ref 8.4–10.5)
CHLORIDE: 98 meq/L (ref 96–112)
CO2: 34 mEq/L — ABNORMAL HIGH (ref 19–32)
CREATININE: 0.43 mg/dL — AB (ref 0.50–1.10)
GFR calc Af Amer: >90 mL/min (ref >90)
Glucose, Bld: 297 mg/dL — ABNORMAL HIGH (ref 70–99)
POTASSIUM: 3.2 meq/L — AB (ref 3.7–5.3)
Sodium: 143 mEq/L (ref 137–147)

## 2014-03-10 LAB — CBC
HCT: 28.8 % — ABNORMAL LOW (ref 36.0–46.0)
Hemoglobin: 8.9 g/dL — ABNORMAL LOW (ref 12.0–15.0)
MCH: 21.7 pg — ABNORMAL LOW (ref 26.0–34.0)
MCHC: 30.9 g/dL (ref 30.0–36.0)
MCV: 70.2 fL — AB (ref 78.0–100.0)
PLATELETS: 288 10*3/uL (ref 150–400)
RBC: 4.1 MIL/uL (ref 3.87–5.11)
RDW: 18.4 % — AB (ref 11.5–15.5)
WBC: 10.6 10*3/uL — AB (ref 4.0–10.5)

## 2014-03-10 LAB — MAGNESIUM: MAGNESIUM: 2 mg/dL (ref 1.5–2.5)

## 2014-03-10 LAB — PHOSPHORUS: Phosphorus: 2.6 mg/dL (ref 2.3–4.6)

## 2014-03-10 SURGERY — LEFT HEART CATHETERIZATION WITH CORONARY ANGIOGRAM
Anesthesia: LOCAL

## 2014-03-10 MED ORDER — SODIUM CHLORIDE 0.9 % IV SOLN
0.0000 mg/h | INTRAVENOUS | Status: DC
  Administered 2014-03-10 (×2): 4 mg/h via INTRAVENOUS
  Filled 2014-03-10 (×3): qty 10

## 2014-03-10 MED ORDER — FUROSEMIDE 10 MG/ML IJ SOLN
40.0000 mg | Freq: Three times a day (TID) | INTRAMUSCULAR | Status: DC
  Administered 2014-03-10 – 2014-03-12 (×8): 40 mg via INTRAVENOUS
  Filled 2014-03-10 (×12): qty 4

## 2014-03-10 MED ORDER — ACETAMINOPHEN 325 MG PO TABS
650.0000 mg | ORAL_TABLET | ORAL | Status: DC | PRN
  Administered 2014-03-17 – 2014-03-18 (×2): 650 mg via ORAL
  Filled 2014-03-10 (×2): qty 2

## 2014-03-10 MED ORDER — SODIUM CHLORIDE 0.9 % IJ SOLN: 3.0000 mL | INTRAMUSCULAR | Status: DC | PRN

## 2014-03-10 MED ORDER — SODIUM CHLORIDE 0.9 % IV SOLN
INTRAVENOUS | Status: DC
  Administered 2014-03-10: 2.8 [IU]/h via INTRAVENOUS
  Filled 2014-03-10: qty 2.5

## 2014-03-10 MED ORDER — SODIUM CHLORIDE 0.9 % IJ SOLN: 3.0000 mL | Freq: Two times a day (BID) | INTRAMUSCULAR | Status: DC

## 2014-03-10 MED ORDER — DILTIAZEM HCL 100 MG IV SOLR
5.0000 mg/h | INTRAVENOUS | Status: DC
  Administered 2014-03-10 – 2014-03-11 (×3): 5 mg/h via INTRAVENOUS
  Filled 2014-03-10 (×3): qty 100

## 2014-03-10 MED ORDER — HEPARIN SODIUM (PORCINE) 1000 UNIT/ML IJ SOLN
INTRAMUSCULAR | Status: AC
  Filled 2014-03-10: qty 1

## 2014-03-10 MED ORDER — MIDAZOLAM BOLUS VIA INFUSION
1.0000 mg | INTRAVENOUS | Status: DC | PRN
  Filled 2014-03-10: qty 2

## 2014-03-10 MED ORDER — DILTIAZEM LOAD VIA INFUSION
15.0000 mg | Freq: Once | INTRAVENOUS | Status: AC
  Administered 2014-03-10: 15 mg via INTRAVENOUS
  Filled 2014-03-10: qty 15

## 2014-03-10 MED ORDER — INSULIN ASPART 100 UNIT/ML ~~LOC~~ SOLN
0.0000 [IU] | SUBCUTANEOUS | Status: DC
  Administered 2014-03-10 (×2): 5 [IU] via SUBCUTANEOUS
  Administered 2014-03-10: 3 [IU] via SUBCUTANEOUS
  Administered 2014-03-11 (×3): 5 [IU] via SUBCUTANEOUS
  Administered 2014-03-11: 8 [IU] via SUBCUTANEOUS
  Administered 2014-03-11: 11 [IU] via SUBCUTANEOUS
  Administered 2014-03-11: 8 [IU] via SUBCUTANEOUS
  Administered 2014-03-12 (×5): 5 [IU] via SUBCUTANEOUS
  Administered 2014-03-12: 3 [IU] via SUBCUTANEOUS
  Administered 2014-03-13: 5 [IU] via SUBCUTANEOUS
  Administered 2014-03-13: 3 [IU] via SUBCUTANEOUS
  Administered 2014-03-13: 8 [IU] via SUBCUTANEOUS
  Administered 2014-03-13: 5 [IU] via SUBCUTANEOUS
  Administered 2014-03-13: 3 [IU] via SUBCUTANEOUS
  Administered 2014-03-13 – 2014-03-14 (×3): 5 [IU] via SUBCUTANEOUS

## 2014-03-10 MED ORDER — HEPARIN (PORCINE) IN NACL 100-0.45 UNIT/ML-% IJ SOLN
1750.0000 [IU]/h | INTRAMUSCULAR | Status: DC
  Administered 2014-03-11: 1750 [IU]/h via INTRAVENOUS
  Filled 2014-03-10 (×3): qty 250

## 2014-03-10 MED ORDER — NITROGLYCERIN 1 MG/10 ML FOR IR/CATH LAB
INTRA_ARTERIAL | Status: AC
  Filled 2014-03-10: qty 10

## 2014-03-10 MED ORDER — ASPIRIN 81 MG PO CHEW
81.0000 mg | CHEWABLE_TABLET | ORAL | Status: DC
Start: 2014-03-11 — End: 2014-03-10

## 2014-03-10 MED ORDER — DILTIAZEM HCL 30 MG PO TABS
30.0000 mg | ORAL_TABLET | Freq: Two times a day (BID) | ORAL | Status: DC
  Filled 2014-03-10 (×2): qty 1

## 2014-03-10 MED ORDER — LIDOCAINE HCL (PF) 1 % IJ SOLN
INTRAMUSCULAR | Status: AC
  Filled 2014-03-10: qty 30

## 2014-03-10 MED ORDER — SODIUM CHLORIDE 0.9 % IV SOLN: 250.0000 mL | INTRAVENOUS | Status: DC | PRN

## 2014-03-10 MED ORDER — MORPHINE SULFATE 2 MG/ML IJ SOLN
2.0000 mg | INTRAMUSCULAR | Status: DC | PRN
  Administered 2014-03-10 – 2014-03-11 (×2): 2 mg via INTRAVENOUS
  Filled 2014-03-10 (×2): qty 1

## 2014-03-10 MED ORDER — ASPIRIN 81 MG PO CHEW
81.0000 mg | CHEWABLE_TABLET | Freq: Every day | ORAL | Status: DC
  Administered 2014-03-11 – 2014-03-20 (×9): 81 mg via ORAL
  Filled 2014-03-10 (×9): qty 1

## 2014-03-10 MED ORDER — HEPARIN (PORCINE) IN NACL 2-0.9 UNIT/ML-% IJ SOLN
INTRAMUSCULAR | Status: AC
  Filled 2014-03-10: qty 1000

## 2014-03-10 MED ORDER — HEPARIN (PORCINE) IN NACL 2-0.9 UNIT/ML-% IJ SOLN
INTRAMUSCULAR | Status: AC
  Filled 2014-03-10: qty 500

## 2014-03-10 MED ORDER — ONDANSETRON HCL 4 MG/2ML IJ SOLN: 4.0000 mg | Freq: Four times a day (QID) | INTRAMUSCULAR | Status: DC | PRN

## 2014-03-10 MED ORDER — VERAPAMIL HCL 2.5 MG/ML IV SOLN
INTRAVENOUS | Status: AC
  Filled 2014-03-10: qty 2

## 2014-03-10 MED ORDER — INSULIN GLARGINE 100 UNIT/ML ~~LOC~~ SOLN
10.0000 [IU] | Freq: Every day | SUBCUTANEOUS | Status: DC
  Administered 2014-03-10 – 2014-03-13 (×4): 10 [IU] via SUBCUTANEOUS
  Filled 2014-03-10 (×4): qty 0.1

## 2014-03-10 MED ORDER — NITROGLYCERIN IN D5W 200-5 MCG/ML-% IV SOLN
2.0000 ug/min | INTRAVENOUS | Status: DC
  Administered 2014-03-10: 5 ug/min via INTRAVENOUS
  Filled 2014-03-10: qty 250

## 2014-03-10 MED ORDER — POTASSIUM CHLORIDE 20 MEQ/15ML (10%) PO LIQD
30.0000 meq | ORAL | Status: AC
  Administered 2014-03-10 (×2): 30 meq
  Filled 2014-03-10 (×4): qty 30

## 2014-03-10 NOTE — Progress Notes (Signed)
Patient transported from cath lab to 2M11 without any complications.

## 2014-03-10 NOTE — H&P (View-Only) (Signed)
    Improved heart rate Dilt IV More comfortable. R fem pop graft patent R iliac stent  Writing orders for cath.  Saifullah Jolley, MD  

## 2014-03-10 NOTE — Progress Notes (Signed)
ANTICOAGULATION CONSULT NOTE - Follow Up Consult  Pharmacy Consult for Heparin  Indication: chest pain/ACS s/p cardiac cath 9/18  Allergies  Allergen Reactions  . Penicillins Hives    Tolerates cefepime  . Adhesive [Tape] Rash    Patient Measurements: Height:  (157.5 cm) Weight: 213 lb 3 oz (96.7 kg) IBW/kg (Calculated) : 50.1 Heparin Dosing Weight: 72 kg  Vital Signs: Temp: 98 F (36.7 C) (09/18 1955) Temp src: Oral (09/18 1955) BP: 99/53 mmHg (09/18 1800) Pulse Rate: 109 (09/18 1945)  Labs:  Recent Labs  03/08/14 0010  03/08/14 0501 03/08/14 1130 03/09/14 0410 03/10/14 0425  HGB  --   < > 8.9* 8.7* 8.3* 8.9*  HCT  --   < > 28.3* 27.7* 26.5* 28.8*  PLT  --   < > 284 231 229 288  HEPARINUNFRC 0.52  --  0.67  --   --   --   CREATININE  --   --  0.41*  --  0.44* 0.43*  < > = values in this interval not displayed.  Estimated Creatinine Clearance: 85.2 ml/min (by C-G formula based on Cr of 0.43).   Assessment: Patient just back from cardiac cath.  Diffuse CAD found.  Heparin to be started 8 hours after sheath removal.  TR band scheduled to be removed at 2130.  Goal of Therapy:  Heparin level 0.3-0.7 units/ml Monitor platelets by anticoagulation protocol: Yes   Plan:  -Resume heparin at 1750 units/hr (previously therapeutic rate) at 0530 on 9/19 -Heparin level 6 hours later -Daily CBC/HL -Monitor for bleeding, trend Hgb  Mickeal Skinner 03/10/2014,8:25 PM

## 2014-03-10 NOTE — Progress Notes (Signed)
Inpatient Diabetes Program Recommendations  AACE/ADA: New Consensus Statement on Inpatient Glycemic Control (2013)  Target Ranges:  Prepandial:   less than 140 mg/dL      Peak postprandial:   less than 180 mg/dL (1-2 hours)      Critically ill patients:  140 - 180 mg/dL   Results for [name], [name] (MRN [id number]) as of 03/10/2014 12:08  Ref. Range 03/10/2014 00:41 03/10/2014 01:48 03/10/2014 02:58 03/10/2014 03:57 03/10/2014 05:23 03/10/2014 06:40 03/10/2014 08:39  Glucose-Capillary Latest Range: 70-99 mg/dL 409 (H) 811 (H) 914 (H) 291 (H) 231 (H) 222 (H) 159 (H)   Diabetes history: DM2  Outpatient Diabetes medications: Amaryl 2 mg BID, Metformin 1000 mg BID  Current orders for Inpatient glycemic control: Novolog 0-15 units Q4H, Lantus 10 units daily  Inpatient Diabetes Program Recommendations Insulin - Basal: Noted patient will transition from IV insulin to SQ despite insulin drip rates greater than 4 units/hr.  Noted order for Lantus 10 units daily to be given at 12:00. If steroids are continued at current dose, patient will likely need to increase Lantus. Recommend increasing to Lantus 20 units (based on 96 kg x 0.2 units) Correction (SSI): Noted patient has been changed from ICU Glycemic Control order set to regular Glycemic Control order set with Moderate correction scale Q4H. While patient is intubated and ordered steroids and tube feeding, recommend using the ICU Glycemic Control order set to maintain inpatient glycemic control. Insulin - Meal Coverage: Please consider ordering Novolog 4 units Q4H for tube feeding coverage.  Thanks, Orlando Penner, RN, MSN, CCRN Diabetes Coordinator Inpatient Diabetes Program [phone] (Team Pager) [phone] ([facility] office) [phone] [facility] office)

## 2014-03-10 NOTE — Progress Notes (Signed)
Subjective  -   Remains intubated but arousable Deterioration from a cardiac standpoint Blood cultures have cleared   Physical Exam:  Both feet are warm with dry ulceration       Assessment/Plan:   At this point, I do not see a vascular source for infection.  Her primary issue appears to be volume overload secondary to cardiac issues.  Would recommend ultrasound of her femoral-popliteal bypass graft if no other infectious issues can be identified, however without clinical signs of bypass graft infection, I think this is not likely the source, especially given the fact that this is a vein conduit.  Please contact Dr. Arbie Cookey over the weekend if any vascular issues arise.  [name], V. WELLS 03/10/2014 9:32 AM --  Ceasar Mons Vitals:   03/10/14 0900  BP:   Pulse:   Temp: 98 F (36.7 C)  Resp:     Intake/Output Summary (Last 24 hours) at 03/10/14 0932 Last data filed at 03/10/14 0800  Gross per 24 hour  Intake 2207.29 ml  Output   4490 ml  Net -2282.71 ml     Laboratory CBC    Component Value Date/Time   WBC 10.6* 03/10/2014 0425   HGB 8.9* 03/10/2014 0425   HCT 28.8* 03/10/2014 0425   PLT 288 03/10/2014 0425    BMET    Component Value Date/Time   NA 143 03/10/2014 0425   K 3.2* 03/10/2014 0425   CL 98 03/10/2014 0425   CO2 34* 03/10/2014 0425   GLUCOSE 297* 03/10/2014 0425   BUN 17 03/10/2014 0425   CREATININE 0.43* 03/10/2014 0425   CREATININE 0.44* 07/22/2013 1612   CALCIUM 8.4 03/10/2014 0425   GFRNONAA >90 03/10/2014 0425   GFRAA >90 03/10/2014 0425    COAG Lab Results  Component Value Date   INR 1.45 03/06/2014   INR 0.98 10/12/2013   No results found for this basename: PTT    Antibiotics Anti-infectives   Start     Dose/Rate Route Frequency Ordered Stop   03/07/14 1200  ceFEPIme (MAXIPIME) 2 g in dextrose 5 % 50 mL IVPB     2 g 100 mL/hr over 30 Minutes Intravenous Every 12 hours 03/07/14 1108     03/07/14 0930  ceFAZolin (ANCEF) IVPB 2 g/50 mL  premix  Status:  Discontinued     2 g 100 mL/hr over 30 Minutes Intravenous 3 times per day 03/07/14 0853 03/07/14 1108   03/06/14 0400  ceFEPIme (MAXIPIME) 1 g in dextrose 5 % 50 mL IVPB  Status:  Discontinued     1 g 100 mL/hr over 30 Minutes Intravenous Every 8 hours 03/05/14 1832 03/07/14 0849   03/06/14 0400  vancomycin (VANCOCIN) IVPB 1000 mg/200 mL premix  Status:  Discontinued     1,000 mg 200 mL/hr over 60 Minutes Intravenous Every 8 hours 03/05/14 1839 03/09/14 1054   03/05/14 1845  ceFEPIme (MAXIPIME) 1 g in dextrose 5 % 50 mL IVPB     1 g 100 mL/hr over 30 Minutes Intravenous  Once 03/05/14 1832 03/05/14 2014   03/05/14 1845  vancomycin (VANCOCIN) 500 mg in sodium chloride 0.9 % 100 mL IVPB  Status:  Discontinued     500 mg 100 mL/hr over 60 Minutes Intravenous  Once 03/05/14 1839 03/07/14 0854   03/05/14 1830  vancomycin (VANCOCIN) IVPB 1000 mg/200 mL premix     1,000 mg 200 mL/hr over 60 Minutes Intravenous  Once 03/05/14 1820 03/05/14 2121  [name], M.D. [facility] Office: [phone] Pager:  [phone]

## 2014-03-10 NOTE — CV Procedure (Signed)
PROCEDURE:  Left heart catheterization with selective coronary angiography, left ventriculogram.  INDICATIONS:  NSTEMI  The risks, benefits, and details of the procedure were explained to the patient.  The patient verbalized understanding and wanted to proceed.  Informed written consent was obtained.  PROCEDURE TECHNIQUE:  After Xylocaine anesthesia a 20F slender sheath was placed in the right radial artery with a single anterior needle wall stick.   Right coronary angiography was done using a Judkins R4 guide catheter.  Left coronary angiography was done using a Judkins L3.5 guide catheter.  Left ventriculography was done using a pigtail catheter.  A TR band was used for hemostasis.   CONTRAST:  Total of 70 cc.  COMPLICATIONS:  None.    HEMODYNAMICS:  Aortic pressure was 102/53; LV pressure was 103/15; LVEDP 31.  There was no gradient between the left ventricle and aorta.    ANGIOGRAPHIC DATA:   The left main coronary artery is patent proximally.  There is a 40% distal left, basilar, in the cranial views.  The left anterior descending artery is a large vessel which wraps around.  There is severe, calcific disease in the mid LAD, up to 90%.  This is most significant at the origin of a medium-sized first diagonal.  This disease is best seen in the caudal views.  In the first diagonal, there were sequential 90% lesions.  Further along in the LAD, there is a focal 95%, calcified lesion. Following that, there is a segment of 80% stenosis and a more diffuse segment of 80% stenosis.  The distal vessel has moderate disease.  The left circumflex artery is a large vessel.  The first marginal is large vessel of moderate disease in the midportion.  In the mid circumflex, there is a focal 95% lesion.  There is TIMI 2 filling of the distal circumflex past the lesion.  There is to flow in a large obtuse marginal, which has moderate disease in the mid vessel.  The right coronary artery is a large,  dominant vessel.  There is moderate, calcific disease throughout the mid to distal vessel.  The proximal stent is patent.  In the mid vessel, there appears to be a focal 75% stenosis, which is best noted in the AP cranial view.  The posterior descending artery is medium size and patent.  The posterolateral artery is medium size with moderate disease in the mid vessel.  LEFT VENTRICULOGRAM:  Left ventricular angiogram was done in the 30 RAO projection and revealed global hypokinesis of severe degree, with akinesis of the distal anterior, apical, and distal inferior walls.  Overall systolic function was severely decreased with an estimated ejection fraction of 15%.    LVEDP was 31 mmHg.  IMPRESSIONS:  1. 40% distal left main coronary artery lesion. 2. Severe diffuse disease from the mid to distal left anterior descending artery.  Significant disease in the first diagonal. 3. 95% stenosis in the mid left circumflex artery, which is likely the culprit for her NSTEMI. 4.  Focal, calcific 75% lesion in the mid  right coronary artery. 5.  Severely decreased left ventricular systolic function.  LVEDP 31  mmHg.  Ejection fraction 15%.  RECOMMENDATION:    Difficult case involving a young patient with advanced atherosclerotic coronary disease.  CABG would be her best option for revascularization under normal circumstances.  Her LV dysfunction is more severe by ventriculogram.  Volume overload is likely playing a part as to why she cannot be extubated.  I discussed the case with Dr. Anne Fu.  Since there is no single lesion that can be revascularized, which will help her LV dysfunction, continue medical therapy with diuresis, and heart rate control.  Would consider CT surgery evaluation when she recovers from her current critical illness.

## 2014-03-10 NOTE — Progress Notes (Signed)
[facility] ADULT ICU REPLACEMENT PROTOCOL FOR AM LAB REPLACEMENT ONLY  The patient does apply for the [facility] Adult ICU Electrolyte Replacment Protocol based on the criteria listed below:   1. Is GFR >/= 40 ml/min? Yes.    Patient's GFR today is >90. 2. Is urine output >/= 0.5 ml/kg/hr for the last 6 hours? Yes.   Patient's UOP is 0.95 ml/kg/hr 3. Is BUN < 60 mg/dL? Yes.    Patient's BUN today is 17.    4. Abnormal electrolyte(s): Potassium (3.2).    5. Ordered repletion with: per protocol.  6. If a panic level lab has been reported, has the CCM MD in charge been notified? No..   Physician:  Dr. Dema Severin.  Loyola Mast, Randa Riss P 03/10/2014 6:08 AM

## 2014-03-10 NOTE — Progress Notes (Signed)
    Improved heart rate Dilt IV More comfortable. R fem pop graft patent R iliac stent  Writing orders for cath.  Donato Schultz, MD

## 2014-03-10 NOTE — Progress Notes (Signed)
Subjective: Failed weaning. Complaining of CP, diaphoresis. Tachycardia.     Objective: Vital signs in last 24 hours: Temp:  [97.5 F (36.4 C)-99.1 F (37.3 C)] 98 F (36.7 C) (09/18 0900) Pulse Rate:  [80-137] 102 (09/18 1000) Resp:  [9-28] 9 (09/18 1000) BP: (78-152)/(40-94) 139/74 mmHg (09/18 1000) SpO2:  [92 %-100 %] 99 % (09/18 1000) FiO2 (%):  [40 %] 40 % (09/18 1010) Weight:  [213 lb 3 oz (96.7 kg)] 213 lb 3 oz (96.7 kg) (09/18 0414) Last BM Date: 03/06/14  Intake/Output from previous day: 09/17 0701 - 09/18 0700 In: 2352.6 [I.V.:817.6; ZO/XW:9604; IV Piggyback:100] Out: 4310 [Urine:4310] Intake/Output this shift: Total I/O In: 516.3 [I.V.:351.3; NG/GT:165] Out: 500 [Urine:500]  Medications Current Facility-Administered Medications  Medication Dose Route Frequency Provider Last Rate Last Dose  . 0.9 %  sodium chloride infusion   Intravenous Continuous PRN Nelda Bucks, MD 20 mL/hr at 03/10/14 1000    . acetaminophen (TYLENOL) tablet 650 mg  650 mg Oral Q6H PRN Eduard Clos, MD   650 mg at 03/06/14 0145   Or  . acetaminophen (TYLENOL) suppository 650 mg  650 mg Rectal Q6H PRN Eduard Clos, MD      . albuterol (PROVENTIL) (2.5 MG/3ML) 0.083% nebulizer solution 2.5 mg  2.5 mg Nebulization Q3H PRN Nelda Bucks, MD   2.5 mg at 03/09/14 1015  . antiseptic oral rinse (CPC / CETYLPYRIDINIUM CHLORIDE 0.05%) solution 7 mL  7 mL Mouth Rinse QID Lupita Leash, MD   7 mL at 03/10/14 0400  . aspirin chewable tablet 81 mg  81 mg Per Tube Daily Lupita Leash, MD   81 mg at 03/10/14 1012  . atorvastatin (LIPITOR) tablet 40 mg  40 mg Per Tube q1800 Lupita Leash, MD   40 mg at 03/09/14 1542  . ceFEPIme (MAXIPIME) 2 g in dextrose 5 % 50 mL IVPB  2 g Intravenous Q12H Ginnie Smart, MD   2 g at 03/10/14 1020  . chlorhexidine (PERIDEX) 0.12 % solution 15 mL  15 mL Mouth Rinse BID Lupita Leash, MD   15 mL at 03/10/14 0813  . darifenacin  (ENABLEX) 24 hr tablet 7.5 mg  7.5 mg Oral Daily Eduard Clos, MD   7.5 mg at 03/09/14 0954  . diltiazem (CARDIZEM) 1 mg/mL load via infusion 15 mg  15 mg Intravenous Once Nelda Bucks, MD       And  . diltiazem (CARDIZEM) 100 mg in dextrose 5 % 100 mL (1 mg/mL) infusion  5 mg/hr Intravenous Continuous Nelda Bucks, MD      . escitalopram (LEXAPRO) tablet 10 mg  10 mg Per Tube Daily Lupita Leash, MD   10 mg at 03/09/14 0954  . famotidine (PEPCID) IVPB 20 mg  20 mg Intravenous Q12H Lupita Leash, MD   20 mg at 03/10/14 0926  . feeding supplement (VITAL HIGH PROTEIN) liquid 1,000 mL  1,000 mL Per Tube Q24H Hettie Holstein, RD      . fentaNYL (SUBLIMAZE) 2,500 mcg in sodium chloride 0.9 % 250 mL (10 mcg/mL) infusion  0-400 mcg/hr Intravenous Continuous Lupita Leash, MD 30 mL/hr at 03/10/14 1016 300 mcg/hr at 03/10/14 1016  . fentaNYL (SUBLIMAZE) bolus via infusion 50-100 mcg  50-100 mcg Intravenous Q1H PRN Lupita Leash, MD   100 mcg at 03/09/14 2040  . fentaNYL (SUBLIMAZE) injection 50 mcg  50 mcg Intravenous Q2H PRN Arshad N  Toniann Fail, MD      . fluticasone [facility]) 50 MCG/ACT nasal spray 1 spray  1 spray Each Nare Daily PRN Eduard Clos, MD      . furosemide (LASIX) injection 40 mg  40 mg Intravenous TID Nelda Bucks, MD      . heparin injection 5,000 Units  5,000 Units Subcutaneous 3 times per day Nelda Bucks, MD   5,000 Units at 03/10/14 0529  . insulin aspart (novoLOG) injection 0-15 Units  0-15 Units Subcutaneous 6 times per day Nelda Bucks, MD      . insulin glargine (LANTUS) injection 10 Units  10 Units Subcutaneous Daily Nelda Bucks, MD      . ipratropium-albuterol (DUONEB) 0.5-2.5 (3) MG/3ML nebulizer solution 3 mL  3 mL Nebulization Q6H Lupita Leash, MD   3 mL at 03/10/14 0847  . methylPREDNISolone sodium succinate (SOLU-MEDROL) 125 mg/2 mL injection 80 mg  80 mg Intravenous Q6H Nelda Bucks, MD   80  mg at 03/10/14 0527  . morphine 2 MG/ML injection 2 mg  2 mg Intravenous Q2H PRN Nelda Bucks, MD   2 mg at 03/10/14 1119  . nitroGLYCERIN 50 mg in dextrose 5 % 250 mL (0.2 mg/mL) infusion  2-200 mcg/min Intravenous Titrated Nelda Bucks, MD 1.5 mL/hr at 03/10/14 1120 5 mcg/min at 03/10/14 1120  . ondansetron (ZOFRAN) tablet 4 mg  4 mg Oral Q6H PRN Eduard Clos, MD       Or  . ondansetron [facility]) injection 4 mg  4 mg Intravenous Q6H PRN Eduard Clos, MD      . oxyCODONE (Oxy IR/ROXICODONE) immediate release tablet 30 mg  30 mg Oral Q4H PRN Eduard Clos, MD        PE: General appearance: alert, cooperative and no distress Lungs: Bilateral wheeze Heart: Reg rhythm.  Rate fast.  No MM Abdomen: +BS, soft nontender.  Extremities: Trace LEE Pulses: 2+ radials.  0 distal pulses Skin: mildly diaphoretic.  Neurologic: Grossly normal  Lab Results:   Recent Labs  03/08/14 1130 03/09/14 0410 03/10/14 0425  WBC 10.6* 8.4 10.6*  HGB 8.7* 8.3* 8.9*  HCT 27.7* 26.5* 28.8*  PLT 231 229 288   BMET  Recent Labs  03/08/14 0501 03/09/14 0410 03/10/14 0425  NA 134* 140 143  K 3.9 3.4* 3.2*  CL 100 103 98  CO2 21 26 34*  GLUCOSE 220* 153* 297*  BUN CREATININE 0.41* 0.44* 0.43*  CALCIUM 7.9* 8.0* 8.4   PT/INR No results found for this basename: LABPROT, INR,  in the last 72 hours  Assessment/Plan  58 year old with demand ischemia vs NSTEMI in the setting of GPC sepsis, MSSA with cardiomyopathy EF 30-35% with appearance of Takotsubo cardiomyopathy or LAD disease  1. NSTEMI  Last troponin 16.99  echo shows EF 30s, takotsuko vs LAD disease  plan for L +/- R heart cath. Discussed with Dr. Tyson Alias. She is doing poorly. Distressed, ST depression with tachycardia, chest pain  - NTG gtt  - Diltiazem IV (understand underlying cardiomyopathy but severely bronchospastic currently.   - Discussed with Dr. Eldridge Dace. Will proceed.  2. Acute  respiratory failure  COPD and PNA with staph bacteremia.   ID consulted Failing extubation  3. Acute systolic HF  Net fluids: 2L yesterday out.  Echo 03/06/2014 EF 30-35%, anterior, anteroseptal, apical and inferoapical hypokinesis, ?LAD disease vs Takotsubo's cardiomyopathy, grade 1 diastolic dysfunction . Lasix 40 IV  TID increased. Likely driving current status.  4. H/o CAD s/p RCA stent 2000  - previously followed by Dr. Dickie La before he retired   5. sepsis with elevated HR and WBC along with hypotension  SP TEE with no evidence of endocarditis.  Mild MR. EF estimated at 35-40%. Anterior, anteroseptal, apical and inferoapical hypokinesis.  VSS, pressors stopped. ID following. Repeat blood cx cleared.   6. PVD s/p R bypass graft is patent but outflow to the tibioperoneal trunk is occluded.  - Right iliac stent patent.  7. DM2  8. Obesity 9. Left leg pain s/p recent vascular access  10. Microcytosis, ?iron deficient  11. Tobacco abuse  Cardiac cath. R+Left.    LOS: 5 days    Donato Schultz MD  03/10/2014 11:28 AM

## 2014-03-10 NOTE — Interval H&P Note (Signed)
Cath Lab Visit (complete for each Cath Lab visit)  Clinical Evaluation Leading to the Procedure:   ACS: Yes.    Non-ACS:    Anginal Classification: CCS IV  Anti-ischemic medical therapy: Maximal Therapy (2 or more classes of medications)  Non-Invasive Test Results: No non-invasive testing performed  Prior CABG: No previous CABG      History and Physical Interval Note:  03/10/2014 6:41 PM  [name]  has presented today for surgery, with the diagnosis of cp  The various methods of treatment have been discussed with the patient and family. After consideration of risks, benefits and other options for treatment, the patient has consented to  Procedure(s): LEFT HEART CATHETERIZATION WITH CORONARY ANGIOGRAM (N/A) as a surgical intervention .  The patient's history has been reviewed, patient examined, no change in status, stable for surgery.  I have reviewed the patient's chart and labs.  Questions were answered to the patient's satisfaction.     Kaitlin Debes S.

## 2014-03-10 NOTE — Progress Notes (Signed)
INFECTIOUS DISEASE PROGRESS NOTE  ID: [name] is a 58 y.o. female with  Principal Problem:   Sepsis Active Problems:   Left leg pain   Anemia   Diabetes mellitus, controlled  Subjective: On vent, uncomfortable.   Abtx:  Anti-infectives   Start     Dose/Rate Route Frequency Ordered Stop   03/07/14 1200  ceFEPIme (MAXIPIME) 2 g in dextrose 5 % 50 mL IVPB     2 g 100 mL/hr over 30 Minutes Intravenous Every 12 hours 03/07/14 1108     03/07/14 0930  ceFAZolin (ANCEF) IVPB 2 g/50 mL premix  Status:  Discontinued     2 g 100 mL/hr over 30 Minutes Intravenous 3 times per day 03/07/14 0853 03/07/14 1108   03/06/14 0400  ceFEPIme (MAXIPIME) 1 g in dextrose 5 % 50 mL IVPB  Status:  Discontinued     1 g 100 mL/hr over 30 Minutes Intravenous Every 8 hours 03/05/14 1832 03/07/14 0849   03/06/14 0400  vancomycin (VANCOCIN) IVPB 1000 mg/200 mL premix  Status:  Discontinued     1,000 mg 200 mL/hr over 60 Minutes Intravenous Every 8 hours 03/05/14 1839 03/09/14 1054   03/05/14 1845  ceFEPIme (MAXIPIME) 1 g in dextrose 5 % 50 mL IVPB     1 g 100 mL/hr over 30 Minutes Intravenous  Once 03/05/14 1832 03/05/14 2014   03/05/14 1845  vancomycin (VANCOCIN) 500 mg in sodium chloride 0.9 % 100 mL IVPB  Status:  Discontinued     500 mg 100 mL/hr over 60 Minutes Intravenous  Once 03/05/14 1839 03/07/14 0854   03/05/14 1830  vancomycin (VANCOCIN) IVPB 1000 mg/200 mL premix     1,000 mg 200 mL/hr over 60 Minutes Intravenous  Once 03/05/14 1820 03/05/14 2121      Medications:  Scheduled: . antiseptic oral rinse  7 mL Mouth Rinse QID  . aspirin  81 mg Per Tube Daily  . atorvastatin  40 mg Per Tube q1800  . ceFEPime (MAXIPIME) IV  2 g Intravenous Q12H  . chlorhexidine  15 mL Mouth Rinse BID  . darifenacin  7.5 mg Oral Daily  . escitalopram  10 mg Per Tube Daily  . famotidine (PEPCID) IV  20 mg Intravenous Q12H  . feeding supplement (VITAL HIGH PROTEIN)  1,000 mL Per Tube Q24H  .  furosemide  20 mg Intravenous TID  . heparin subcutaneous  5,000 Units Subcutaneous 3 times per day  . ipratropium-albuterol  3 mL Nebulization Q6H  . methylPREDNISolone (SOLU-MEDROL) injection  80 mg Intravenous Q6H  . potassium chloride  30 mEq Per Tube Q4H    Objective: Vital signs in last 24 hours: Temp:  [97.5 F (36.4 C)-99.1 F (37.3 C)] 98 F (36.7 C) (09/18 0900) Pulse Rate:  [80-137] 109 (09/18 0800) Resp:  [13-28] 14 (09/18 0800) BP: (78-152)/(40-94) 120/62 mmHg (09/18 0800) SpO2:  [92 %-100 %] 100 % (09/18 0847) FiO2 (%):  [40 %] 40 % (09/18 0847) Weight:  [96.7 kg (213 lb 3 oz)] 96.7 kg (213 lb 3 oz) (09/18 0414)   General appearance: alert and moderate distress Resp: rhonchi bilaterally Cardio: tachycardia GI: normal findings: bowel sounds normal and soft, non-tender Extremities: edema none and RLE ulcer unchanged. R foot slightly cooler than L.   Lab Results  Recent Labs  03/09/14 0410 03/10/14 0425  WBC 8.4 10.6*  HGB 8.3* 8.9*  HCT 26.5* 28.8*  NA 140 143  K 3.4* 3.2*  CL 103 98  CO2 26 34*  BUN 11 17  CREATININE 0.44* 0.43*   Liver Panel  Recent Labs  03/08/14 0501  PROT 6.0  ALBUMIN 2.2*  AST 41*  ALT 50*  ALKPHOS 72  BILITOT 0.3   Sedimentation Rate No results found for this basename: ESRSEDRATE,  in the last 72 hours C-Reactive Protein No results found for this basename: CRP,  in the last 72 hours  Microbiology: Recent Results (from the past 240 hour(s))  URINE CULTURE     Status: None   Collection Time    03/05/14  5:05 PM      Result Value Ref Range Status   Specimen Description URINE, RANDOM   Final   Special Requests NONE   Final   Culture  Setup Time     Final   Value: 03/05/2014 18:00     Performed at [facility] Count     Final   Value: NO GROWTH     Performed at [facility]   Culture     Final   Value: NO GROWTH     Performed at [facility]   Report Status 03/07/2014 FINAL    Final  CULTURE, BLOOD (ROUTINE X 2)     Status: None   Collection Time    03/05/14  5:12 PM      Result Value Ref Range Status   Specimen Description BLOOD RIGHT ARM   Final   Special Requests BOTTLES DRAWN AEROBIC AND ANAEROBIC 7CC EA   Final   Culture  Setup Time     Final   Value: 03/06/2014 00:49     Performed at [facility]   Culture     Final   Value: STAPHYLOCOCCUS AUREUS     Note: RIFAMPIN AND GENTAMICIN SHOULD NOT BE USED AS SINGLE DRUGS FOR TREATMENT OF STAPH INFECTIONS.     Note: Gram Stain Report Called to,Read Back By and Verified With: THERESA@1 :30PM ON 03/06/14 BY DANTS     Performed at [facility]   Report Status 03/08/2014 FINAL   Final   Organism ID, Bacteria STAPHYLOCOCCUS AUREUS   Final  CULTURE, BLOOD (ROUTINE X 2)     Status: None   Collection Time    03/05/14  6:05 PM      Result Value Ref Range Status   Specimen Description BLOOD RIGHT FOREARM   Final   Special Requests BOTTLES DRAWN AEROBIC AND ANAEROBIC 10CC EA   Final   Culture  Setup Time     Final   Value: 03/06/2014 00:49     Performed at [facility]   Culture     Final   Value: STAPHYLOCOCCUS AUREUS     Note: SUSCEPTIBILITIES PERFORMED ON PREVIOUS CULTURE WITHIN THE LAST 5 DAYS.     Note: Gram Stain Report Called to,Read Back By and Verified With: THERESA@1 :30PM ON 03/06/14 BY DANTS     Performed at [facility]   Report Status 03/08/2014 FINAL   Final  MRSA PCR SCREENING     Status: None   Collection Time    03/05/14 10:28 PM      Result Value Ref Range Status   MRSA by PCR NEGATIVE  NEGATIVE Final   Comment:            The GeneXpert MRSA Assay (FDA     approved for NASAL specimens     only), is one component of a     comprehensive MRSA  colonization     surveillance program. It is not     intended to diagnose MRSA     infection nor to guide or     monitor treatment for     MRSA infections.  WOUND CULTURE     Status: None   Collection Time    03/05/14  11:52 PM      Result Value Ref Range Status   Specimen Description WOUND RIGHT GROIN   Final   Special Requests NONE   Final   Gram Stain     Final   Value: NO WBC SEEN     RARE SQUAMOUS EPITHELIAL CELLS PRESENT     MODERATE GRAM NEGATIVE RODS     Performed at [facility]   Culture     Final   Value: MODERATE PSEUDOMONAS AERUGINOSA     Performed at [facility]   Report Status 03/08/2014 FINAL   Final   Organism ID, Bacteria PSEUDOMONAS AERUGINOSA   Final  URINE CULTURE     Status: None   Collection Time    03/06/14  9:15 AM      Result Value Ref Range Status   Specimen Description URINE, CATHETERIZED   Final   Special Requests NONE   Final   Culture  Setup Time     Final   Value: 03/06/2014 13:06     Performed at [facility] Count     Final   Value: NO GROWTH     Performed at [facility]   Culture     Final   Value: NO GROWTH     Performed at [facility]   Report Status 03/07/2014 FINAL   Final  CULTURE, BLOOD (ROUTINE X 2)     Status: None   Collection Time    03/07/14  9:50 AM      Result Value Ref Range Status   Specimen Description BLOOD RIGHT HAND   Final   Special Requests BOTTLES DRAWN AEROBIC AND ANAEROBIC 10CC   Final   Culture  Setup Time     Final   Value: 03/07/2014 16:05     Performed at [facility]   Culture     Final   Value:        BLOOD CULTURE RECEIVED NO GROWTH TO DATE CULTURE WILL BE HELD FOR 5 DAYS BEFORE ISSUING A FINAL NEGATIVE REPORT     Performed at [facility]   Report Status PENDING   Incomplete  CULTURE, BLOOD (ROUTINE X 2)     Status: None   Collection Time    03/07/14 12:10 PM      Result Value Ref Range Status   Specimen Description BLOOD LEFT NECK   Final   Special Requests BOTTLES DRAWN AEROBIC AND ANAEROBIC 10CC IJ CVC   Final   Culture  Setup Time     Final   Value: 03/07/2014 18:53     Performed at [facility]   Culture     Final   Value:         BLOOD CULTURE RECEIVED NO GROWTH TO DATE CULTURE WILL BE HELD FOR 5 DAYS BEFORE ISSUING A FINAL NEGATIVE REPORT     Performed at [facility]   Report Status PENDING   Incomplete    Studies/Results: Dg Chest Portable 1 View  03/10/2014   CLINICAL DATA:  Evaluate airspace disease and endotracheal tube position  EXAM: PORTABLE CHEST - 1  VIEW  COMPARISON:  Portable chest x-ray of 03/09/2014  FINDINGS: There is little change in diffuse airspace disease most consistent with edema. Cardiomegaly and probable small effusions remain. The endotracheal tube tip is approximately 4.9 cm above the carina. NG tube extends below the hemidiaphragm.  IMPRESSION: Little change in poor aeration in diffuse airspace disease most consistent with edema with probable effusions.   Electronically Signed   By: Dwyane Dee M.D.   On: 03/10/2014 07:51   Dg Chest Port 1 View  03/09/2014   CLINICAL DATA:  Respiratory distress.  EXAM: PORTABLE CHEST - 1 VIEW  COMPARISON:  03/09/2014  FINDINGS: Endotracheal tube is in place with tip approximately 3 cm above the carina. Left IJ central line tip overlies the level of the superior vena cava. Nasogastric tube is in place with tip off the film but beyond the gastroesophageal junction.  The heart is enlarged. There are central airspace filling opacity similar in appearance to the prior study.  IMPRESSION: Persistent bilateral airspace filled opacities.   Electronically Signed   By: Rosalie Gums M.D.   On: 03/09/2014 19:59   Dg Chest Port 1 View  03/09/2014   CLINICAL DATA:  Pulmonary edema.  EXAM: PORTABLE CHEST - 1 VIEW  COMPARISON:  Same day.  FINDINGS: Stable mild cardiomegaly. Endotracheal tube is in grossly good position with distal tip approximately 7 cm above the carina. Nasogastric tube is seen entering stomach. Left internal jugular catheter line is noted with distal tip in expected position of the SVC. Bilateral perihilar and basilar interstitial densities are noted which are  improved compared to prior exam. This is in consistent with improving pulmonary edema. No pneumothorax or significant pleural effusion is noted.  IMPRESSION: Mildly improved bilateral pulmonary edema.   Electronically Signed   By: Roque Lias M.D.   On: 03/09/2014 13:49   Dg Chest Port 1 View  03/09/2014   CLINICAL DATA:  Reassess known airspace disease and support tube positioning  EXAM: PORTABLE CHEST - 1 VIEW  COMPARISON:  Portable chest x-ray of 08 March 2014  FINDINGS: The lungs are adequately inflated. Widespread airspace consolidation is present but may have slightly improved since yesterday's study. The hemidiaphragms are obscured. There is no pneumothorax. The cardiac silhouette is mildly enlarged. The pulmonary vascularity is engorged.  The endotracheal tube tip lies 3 cm above the crotch of the carina. The left internal jugular venous catheter tip projects over the junction of the proximal and midportions of the SVC. The esophagogastric tube tip projects off the inferior margin of the image.  IMPRESSION: 1. Stable to slightly improved appearance of the pulmonary interstitial edema. 2. The support tubes and lines are in reasonable position.   Electronically Signed   By: David  [place]   On: 03/09/2014 07:50   Dg Abd Portable 1v  03/08/2014   CLINICAL DATA:  Orogastric tube placement  EXAM: PORTABLE ABDOMEN - 1 VIEW  COMPARISON:  None.  FINDINGS: Orogastric tube tip and side port are in the stomach. There is contrast in the colon. The bowel gas pattern is unremarkable. No obstruction or free air appreciable.  IMPRESSION: Orogastric tube tip and side port are in the stomach. Bowel gas pattern unremarkable.   Electronically Signed   By: Bretta Bang M.D.   On: 03/08/2014 16:09     Assessment/Plan: Staph bacteremia- MSSA  TEE (-)  Repeat BCx 9-15 pending  PVD  R heel ulcer- Cx pseudomonas  NSTEMI  DM2  Tobacco use  Total days of antibiotics: 5  9/13 vanco 9/17 9/13 cefepime    9/15 cefazolin 9/15   Would order u/s of her graft to eval.  Await her repeat BCx  Appears grossly unchanged.  Dr Drue Second available if questions.          Johny Sax Infectious Diseases (pager) [phone] [email or web address] 03/10/2014, 10:05 AM  LOS: 5 days

## 2014-03-10 NOTE — Progress Notes (Signed)
PULMONARY / CRITICAL CARE MEDICINE   Name: [name] MRN: [id number] DOB: [birth year]    ADMISSION DATE:  03/05/2014 CONSULTATION DATE:  9/14  REFERRING MD :  [facility]  CHIEF COMPLAINT: Left leg pain  INITIAL PRESENTATION: Left leg pain, hypotension and hypoxic.  STUDIES:  9/14  Echo- EF 30-35% 9/16  TEE- no LAA thrombus, no valvular vegetation, anterior, anteroseptal, apical, and inferioapical hypokinesis, EF 35% 9/17 EKG- sinus tach, low voltage QRS, cannot r/o anteroseptal infarct  SIGNIFICANT EVENTS: 9/14 hypoxia, hypotension . ++ Trop I 9/14 intubated 9/15 pos BC MSSA  9/16 TEE>>> see above 9/17- neg 2 liters, still bronchospastic  SUBJECTIVE: remains on vent, c/o chest pain  VITAL SIGNS: Temp:  [97.5 F (36.4 C)-99.1 F (37.3 C)] 98 F (36.7 C) (09/18 0900) Pulse Rate:  [80-140] 109 (09/18 0800) Resp:  [13-28] 14 (09/18 0800) BP: (78-152)/(40-94) 120/62 mmHg (09/18 0800) SpO2:  [92 %-100 %] 100 % (09/18 0847) FiO2 (%):  [40 %] 40 % (09/18 0847) Weight:  [213 lb 3 oz (96.7 kg)] 213 lb 3 oz (96.7 kg) (09/18 0414) HEMODYNAMICS:   VENTILATOR SETTINGS: Vent Mode:  [-] PSV;CPAP FiO2 (%):  [40 %] 40 % Set Rate:  [14 bmp] 14 bmp Vt Set:  [550 mL] 550 mL PEEP:  [5 cmH20] 5 cmH20 Pressure Support:  [5 cmH20] 5 cmH20 Plateau Pressure:  [16 cmH20-21 cmH20] 16 cmH20 INTAKE / OUTPUT:  Intake/Output Summary (Last 24 hours) at 03/10/14 0948 Last data filed at 03/10/14 0800  Gross per 24 hour  Intake 2207.29 ml  Output   4490 ml  Net -2282.71 ml    PHYSICAL EXAMINATION: General:  Obese female on vent, diaphoretic Neuro:  rass 0 HEENT:  JVD Cardiovascular: s1 s2, tachycardic Lungs:  Ronchi, mod wheezing diffusse insp exp, reoccurence Abdomen:  nonTender, large ecchymotic area left femoral area and into abd. Hypo BS Musculoskeletal:  Lower ext PVD changes with multiple healing ulcers. Pulses palp,  Skin: Ulcers as noted, dry, R foot cool to touch, L warm and  edematous  LABS:  CBC  Recent Labs Lab 03/08/14 1130 03/09/14 0410 03/10/14 0425  WBC 10.6* 8.4 10.6*  HGB 8.7* 8.3* 8.9*  HCT 27.7* 26.5* 28.8*  PLT 231 229 288   Coag's  Recent Labs Lab 03/06/14 1300  INR 1.45   BMET  Recent Labs Lab 03/08/14 0501 03/09/14 0410 03/10/14 0425  NA 134* 140 143  K 3.9 3.4* 3.2*  CL 100 103 98  CO2 21 26 34*  BUN CREATININE 0.41* 0.44* 0.43*  GLUCOSE 220* 153* 297*   Electrolytes  Recent Labs Lab 03/08/14 0501 03/09/14 0410 03/10/14 0425  CALCIUM 7.9* 8.0* 8.4  MG 2.2 1.9 2.0  PHOS 1.7* 2.6 2.6   Sepsis Markers  Recent Labs Lab 03/05/14 2308 03/06/14 0415  03/06/14 1400 03/06/14 1938 03/07/14 0500 03/08/14 0501  LATICACIDVEN 1.5 0.8  --  1.1  --   --   --   PROCALCITON  --   --   < >  --  1.59 1.34 0.79  < > = values in this interval not displayed. ABG  Recent Labs Lab 03/07/14 0936 03/08/14 0500 03/09/14 1031  PHART 7.313* 7.346* 7.304*  PCO2ART 40.3 40.2 54.6*  PO2ART 76.0* 114.0* 124.0*   Liver Enzymes  Recent Labs Lab 03/05/14 1650 03/06/14 0415 03/08/14 0501  AST 31 41* 41*  ALT 11 15 50*  ALKPHOS 78 64 72  BILITOT 0.7 0.6 0.3  ALBUMIN 3.0* 2.4* 2.2*   Cardiac Enzymes  Recent Labs Lab 03/06/14 0415 03/06/14 1015 03/06/14 1718  TROPONINI 6.05* 10.57* 16.99*  PROBNP  --  8842.0*  --    Glucose  Recent Labs Lab 03/10/14 0148 03/10/14 0258 03/10/14 0357 03/10/14 0523 03/10/14 0640 03/10/14 0839  GLUCAP 332* 319* 291* 231* 222* 159*    Imaging Dg Chest Port 1 View  03/09/2014   CLINICAL DATA:  Respiratory distress.  EXAM: PORTABLE CHEST - 1 VIEW  COMPARISON:  03/09/2014  FINDINGS: Endotracheal tube is in place with tip approximately 3 cm above the carina. Left IJ central line tip overlies the level of the superior vena cava. Nasogastric tube is in place with tip off the film but beyond the gastroesophageal junction.  The heart is enlarged. There are central airspace  filling opacity similar in appearance to the prior study.  IMPRESSION: Persistent bilateral airspace filled opacities.   Electronically Signed   By: Rosalie Gums M.D.   On: 03/09/2014 19:59   Dg Chest Port 1 View  03/09/2014   CLINICAL DATA:  Pulmonary edema.  EXAM: PORTABLE CHEST - 1 VIEW  COMPARISON:  Same day.  FINDINGS: Stable mild cardiomegaly. Endotracheal tube is in grossly good position with distal tip approximately 7 cm above the carina. Nasogastric tube is seen entering stomach. Left internal jugular catheter line is noted with distal tip in expected position of the SVC. Bilateral perihilar and basilar interstitial densities are noted which are improved compared to prior exam. This is in consistent with improving pulmonary edema. No pneumothorax or significant pleural effusion is noted.  IMPRESSION: Mildly improved bilateral pulmonary edema.   Electronically Signed   By: Roque Lias M.D.   On: 03/09/2014 13:49   Dg Chest Port 1 View  03/09/2014   CLINICAL DATA:  Reassess known airspace disease and support tube positioning  EXAM: PORTABLE CHEST - 1 VIEW  COMPARISON:  Portable chest x-ray of 08 March 2014  FINDINGS: The lungs are adequately inflated. Widespread airspace consolidation is present but may have slightly improved since yesterday's study. The hemidiaphragms are obscured. There is no pneumothorax. The cardiac silhouette is mildly enlarged. The pulmonary vascularity is engorged.  The endotracheal tube tip lies 3 cm above the crotch of the carina. The left internal jugular venous catheter tip projects over the junction of the proximal and midportions of the SVC. The esophagogastric tube tip projects off the inferior margin of the image.  IMPRESSION: 1. Stable to slightly improved appearance of the pulmonary interstitial edema. 2. The support tubes and lines are in reasonable position.   Electronically Signed   By: David  [place]   On: 03/09/2014 07:50     ASSESSMENT /  PLAN:  PULMONARY OETT>>9/14>> A: Hypoxia - pulm edema Tobacco abuse HCAP (evolving infiltrate RLL) AE COPD? > Emphysema on CT scan, no prior diagnosis of COPD COPD exac likely and edema contribution to bronchospasm Stuttering ischemia?? P:   BD scheduled and PRN, increased frequency Solumedrol  80 q6h, remain pcxr in am for diuresis success SBT initiated 9/16 - failed 9/17, attempting again this am, tolerating better, goal PS 8-10 if able Requires neg balance, cont lasix to neg balance See cvs Add NTG drip  CARDIOVASCULAR CVL>>9/14>> A:  NSTEMI>+ trop I 6.5 9/14 incidental finding. Septic shock- resolving R/o endocarditis-neg per TEE IS this active stuttering ischemia P:  Cards is managing. Hx of CAD + stent 15 years ago Plan for cath when stable, if continues to have worsening  edema - may need cath earlier, have paged cardiology to discuss, [facility] repeat remain neg for days Cont lasix Add NTG drip Morphine  consider re heparinization Control HR important, avoid BB as able with such wheezing, consider addition ca blocker? Vs bisoprolol  RENAL A:   Hypo K, hypomag hypo phos- improved pulm edema P:   KCl qh x 2 per [facility] Saline maintain - kvo Lasix cont to goal neg 2 liters, maintain this but increase  GASTROINTESTINAL A:   feeds P:   pepcid TF at goal  HEMATOLOGIC A:   Chronic anemia L inguinal hematoma Some hemoconcetartion P:  transfuse per protocol for hgb <7.0 Monitor left groin closely- not infectious appearing Cbc in am with active diuresis sub q heparin   INFECTIOUS A:  Bacteremia, MSSA, related to recent angio? Vascular in nature unclear Septic shock resolved R/o HCAP TEE neg  P:   BCx2  9/14>>2/2 Staph aureus>>> UC  9/14>>negative WC 9/13>>pseudomonas Sputum Abx: Vanc  , start date 9/14>>>9/17 Abx: cefepime, start date 9/14>>> Ancef 9/15>>>9/15 BC x2 9/15>>  If pcxr not improved with lasix, consider CT chest to define if MSSA  source from lungs, low clinical suspcion  ENDOCRINE A:   DM last Hbg a1c  7.2 NICE goal 140-180 P:   Insulin drip required Attempt transition lantus 10 then mod ssi   NEUROLOGIC A:  Vent dyschrony P:   fent   TODAY'S SUMMARY:  Bisoprolol added, ntg, consider cath, keep steroids, morphine  I have personally obtained a history, examined the patient, evaluated laboratory and imaging results, formulated the assessment and plan and placed orders. CRITICAL CARE: The patient is critically ill with multiple organ systems failure and requires high complexity decision making for assessment and support, frequent evaluation and titration of therapies, application of advanced monitoring technologies and extensive interpretation of multiple databases. Critical Care Time devoted to patient care services described in this note is 30 minutes.   Beckey Rutter, PA-S  [facility]. Tyson Alias, MD, FACP Pgr: [phone] [facility]

## 2014-03-11 ENCOUNTER — Inpatient Hospital Stay (HOSPITAL_COMMUNITY): Payer: BC Managed Care – PPO

## 2014-03-11 LAB — CBC
HCT: 26.2 % — ABNORMAL LOW (ref 36.0–46.0)
Hemoglobin: 8.1 g/dL — ABNORMAL LOW (ref 12.0–15.0)
MCH: 22 pg — AB (ref 26.0–34.0)
MCHC: 30.9 g/dL (ref 30.0–36.0)
MCV: 71.2 fL — ABNORMAL LOW (ref 78.0–100.0)
PLATELETS: 265 10*3/uL (ref 150–400)
RBC: 3.68 MIL/uL — ABNORMAL LOW (ref 3.87–5.11)
RDW: 18.6 % — AB (ref 11.5–15.5)
WBC: 7.9 10*3/uL (ref 4.0–10.5)

## 2014-03-11 LAB — GLUCOSE, CAPILLARY
Glucose-Capillary: 205 mg/dL — ABNORMAL HIGH (ref 70–99)
Glucose-Capillary: 242 mg/dL — ABNORMAL HIGH (ref 70–99)
Glucose-Capillary: 292 mg/dL — ABNORMAL HIGH (ref 70–99)
Glucose-Capillary: 259 mg/dL — ABNORMAL HIGH (ref 70–99)
Glucose-Capillary: 314 mg/dL — ABNORMAL HIGH (ref 70–99)
Glucose-Capillary: 206 mg/dL — ABNORMAL HIGH (ref 70–99)

## 2014-03-11 LAB — BASIC METABOLIC PANEL
Anion gap: 11 (ref 5–15)
BUN: 28 mg/dL — ABNORMAL HIGH (ref 6–23)
CO2: 37 meq/L — AB (ref 19–32)
Calcium: 8.5 mg/dL (ref 8.4–10.5)
Chloride: 100 mEq/L (ref 96–112)
Creatinine, Ser: 0.45 mg/dL — ABNORMAL LOW (ref 0.50–1.10)
GFR calc Af Amer: >90 mL/min (ref >90)
Glucose, Bld: 317 mg/dL — ABNORMAL HIGH (ref 70–99)
Potassium: 3.5 mEq/L — ABNORMAL LOW (ref 3.7–5.3)
SODIUM: 148 meq/L — AB (ref 137–147)

## 2014-03-11 LAB — HEPARIN LEVEL (UNFRACTIONATED)
Heparin Unfractionated: 0.44 IU/mL (ref 0.30–0.70)
Heparin Unfractionated: 0.82 IU/mL — ABNORMAL HIGH (ref 0.30–0.70)

## 2014-03-11 MED ORDER — HEPARIN (PORCINE) IN NACL 100-0.45 UNIT/ML-% IJ SOLN
1400.0000 [IU]/h | INTRAMUSCULAR | Status: DC
  Administered 2014-03-11: 1600 [IU]/h via INTRAVENOUS
  Filled 2014-03-11 (×2): qty 250

## 2014-03-11 MED ORDER — METHYLPREDNISOLONE SODIUM SUCC 40 MG IJ SOLR
40.0000 mg | Freq: Two times a day (BID) | INTRAMUSCULAR | Status: DC
  Administered 2014-03-11 – 2014-03-12 (×2): 40 mg via INTRAVENOUS
  Filled 2014-03-11 (×4): qty 1

## 2014-03-11 MED ORDER — LISINOPRIL 2.5 MG PO TABS
2.5000 mg | ORAL_TABLET | Freq: Every day | ORAL | Status: DC
  Filled 2014-03-11 (×2): qty 1

## 2014-03-11 NOTE — Procedures (Signed)
Extubation Procedure Note  Patient Details:   Name: DENIKA KRONE DOB: [birth year] MRN: [id number]   Airway Documentation:     Evaluation  O2 sats: stable throughout Complications: No apparent complications Patient did tolerate procedure well. Bilateral Breath Sounds: Rhonchi;Expiratory wheezes Suctioning: Oral Yes Patient extubated to 4lnc. No complications. Vital signs stable at this time. RN at bedside. RT will continue to monitor.  Ave Filter 03/11/2014, 12:18 PM

## 2014-03-11 NOTE — Progress Notes (Signed)
Notified pharmacy for heparin due at 0530 a.m.  Pharmacy notified this was loaded in Pyxis.  Checked and Pyxis was out, so notified pharmacy to send as soon as possible.

## 2014-03-11 NOTE — Progress Notes (Signed)
ANTICOAGULATION CONSULT NOTE - Follow Up Consult  Pharmacy Consult for Heparin  Indication: chest pain/ACS  Allergies  Allergen Reactions  . Penicillins Hives    Tolerates cefepime  . Adhesive [Tape] Rash    Patient Measurements: Height:  (157.5 cm) Weight: 208 lb 15.9 oz (94.8 kg) IBW/kg (Calculated) : 50.1 Heparin Dosing Weight: 72 kg  Vital Signs: Temp: 98.2 F (36.8 C) (09/19 1200) Temp src: Oral (09/19 1200) BP: 133/59 mmHg (09/19 1500) Pulse Rate: 92 (09/19 1500)  Labs:  Recent Labs  03/09/14 0410 03/10/14 0425 03/11/14 0440 03/11/14 1130  HGB 8.3* 8.9* 8.1*  --   HCT 26.5* 28.8* 26.2*  --   PLT 229 288 265  --   HEPARINUNFRC  --   --   --  0.44  CREATININE 0.44* 0.43*  --  0.45*    Estimated Creatinine Clearance: 84.3 ml/min (by C-G formula based on Cr of 0.45).   Assessment: Diffuse CAD found. Heparin was resumed last PM. Level was therapeutic this AM. Will repeat level to confirm.   Goal of Therapy:  Heparin level 0.3-0.7 units/ml Monitor platelets by anticoagulation protocol: Yes   Plan:   Cont heparin at 1750 units/hr  Recheck heparin level 6 hours later Daily CBC/HL  Ulyses Southward, PharmD Pager: [phone] 03/11/2014 3:31 PM

## 2014-03-11 NOTE — Progress Notes (Signed)
Patient Name: [name] Date of Encounter: 03/11/2014     Principal Problem:   Sepsis Active Problems:   Left leg pain   Anemia   Diabetes mellitus, controlled   NSTEMI (non-ST elevated myocardial infarction)    SUBJECTIVE  Remains intubated. Required morphine for chest pain earlier today with relief. Cath yesterday shows severe 3 vessel CAD. EF at cath yesterday estimated at only 15%  CURRENT MEDS . antiseptic oral rinse  7 mL Mouth Rinse QID  . aspirin  81 mg Oral Daily  . atorvastatin  40 mg Per Tube q1800  . ceFEPime (MAXIPIME) IV  2 g Intravenous Q12H  . chlorhexidine  15 mL Mouth Rinse BID  . darifenacin  7.5 mg Oral Daily  . escitalopram  10 mg Per Tube Daily  . famotidine (PEPCID) IV  20 mg Intravenous Q12H  . feeding supplement (VITAL HIGH PROTEIN)  1,000 mL Per Tube Q24H  . furosemide  40 mg Intravenous TID  . insulin aspart  0-15 Units Subcutaneous 6 times per day  . insulin glargine  10 Units Subcutaneous Daily  . ipratropium-albuterol  3 mL Nebulization Q6H  . methylPREDNISolone (SOLU-MEDROL) injection  80 mg Intravenous Q6H    OBJECTIVE  Filed Vitals:   03/11/14 0742 03/11/14 0800 03/11/14 0802 03/11/14 0900  BP:  121/58  119/61  Pulse:  95  92  Temp:   98.4 F (36.9 C)   TempSrc:   Oral   Resp:  15  14  Height:      Weight:      SpO2: 97% 96%  96%    Intake/Output Summary (Last 24 hours) at 03/11/14 1104 Last data filed at 03/11/14 0900  Gross per 24 hour  Intake 1609.5 ml  Output   4740 ml  Net -3130.5 ml   Filed Weights   03/10/14 0414 03/10/14 1310 03/11/14 0500  Weight: 213 lb 3 oz (96.7 kg) 213 lb 3 oz (96.7 kg) 208 lb 15.9 oz (94.8 kg)    PHYSICAL EXAM  General: Partially sedated. Agitated. Intubated Neuro:  Moves all extremities spontaneously. Psych: Not tested. HEENT:  Normal  Neck: Supple without bruits or JVD. Lungs:  Expiratory wheezes. Heart: RRR no s3, s4, or murmurs.Tachycardic Abdomen: Soft, non-tender,  non-distended, BS + x 4.  Extremities: No clubbing, cyanosis or edema. Weak pedal pulses. Bilaterally. Right ankle ulcer is covered with dressing.  Accessory Clinical Findings  CBC  Recent Labs  03/08/14 1130  03/10/14 0425 03/11/14 0440  WBC 10.6*  < > 10.6* 7.9  NEUTROABS 9.2*  --   --   --   HGB 8.7*  < > 8.9* 8.1*  HCT 27.7*  < > 28.8* 26.2*  MCV 70.1*  < > 70.2* 71.2*  PLT 231  < > 288 265  < > = values in this interval not displayed. Basic Metabolic Panel  Recent Labs  03/09/14 0410 03/10/14 0425  NA 140 143  K 3.4* 3.2*  CL 103 98  CO2 26 34*  GLUCOSE 153* 297*  BUN 11 17  CREATININE 0.44* 0.43*  CALCIUM 8.0* 8.4  MG 1.9 2.0  PHOS 2.6 2.6   Liver Function Tests No results found for this basename: AST, ALT, ALKPHOS, BILITOT, PROT, ALBUMIN,  in the last 72 hours No results found for this basename: LIPASE, AMYLASE,  in the last 72 hours Cardiac Enzymes No results found for this basename: CKTOTAL, CKMB, CKMBINDEX, TROPONINI,  in the last 72 hours BNP No components  found with this basename: POCBNP,  D-Dimer No results found for this basename: DDIMER,  in the last 72 hours Hemoglobin A1C No results found for this basename: HGBA1C,  in the last 72 hours Fasting Lipid Panel No results found for this basename: CHOL, HDL, LDLCALC, TRIG, CHOLHDL, LDLDIRECT,  in the last 72 hours Thyroid Function Tests No results found for this basename: TSH, T4TOTAL, FREET3, T3FREE, THYROIDAB,  in the last 72 hours  TELE  Sinus tachycardia.  ECG    Radiology/Studies  Dg Tibia/fibula Left  03/05/2014   CLINICAL DATA:  Lower leg pain.  EXAM: LEFT TIBIA AND FIBULA - 2 VIEW  COMPARISON:  Screws in the distal tibia and side plate and screws in the distal fibula. The fractures have healed. 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 No acute osseous abnormality. Extensive vascular calcifications and  dermal calcifications in the lower leg.  FINDINGS: No acute abnormalities. Arthritis at the knee and ankle. Postsurgical changes.  IMPRESSION: Negative.   Electronically Signed   By: Geanie Cooley M.D.   On: 03/05/2014 22:10   Ct Abdomen Pelvis W Contrast  03/06/2014   CLINICAL DATA:  Left flank pain began after recent vascular catheterization. Pain in the entire left leg. No fever, chills, nausea, vomiting.  EXAM: CT ABDOMEN AND PELVIS WITH CONTRAST  TECHNIQUE: Multidetector CT imaging of the abdomen and pelvis was performed using the standard protocol following bolus administration of intravenous contrast.  CONTRAST:  OMNIPAQUE IOHEXOL 300 MG/ML  SOLN  COMPARISON:  07/25/2013  FINDINGS: Lower chest: Dependent changes are seen in the lung bases. Scattered emphysematous changes are also present. There is atherosclerotic calcification of the coronary vessels. Heart size is normal. No pericardial effusion.  Upper abdomen: Scattered calcified granulomata are identified within the liver. No focal abnormality identified within the spleen, pancreas, or left adrenal gland. There is a low-attenuation lesion adjacent to or involving the right adrenal gland, consistent with a benign process. The patient has had previous cholecystectomy. Bilateral renal cysts are noted.  Bowel: Stomach has a normal appearance. A midline abdominal hernia contains mesenteric fat and nondilated loops of small bowel and appears stable over prior studies. Colonic loops are normal in appearance. There is moderate stool burden. The appendix has a normal appearance.  Pelvis: There is moderate stranding in the subcutaneous tissues and abdominal wall of the left inguinal region. Findings are consistent with edema/ hematoma after recent catheterization. Age no evidence for intraperitoneal hematoma. The uterus is present. No adnexal mass. The urinary bladder is distended.  Retroperitoneum: No retroperitoneal or mesenteric adenopathy. There is  dense atherosclerotic calcification of the abdominal aorta. No aneurysm. Right iliac stent.  Osseous structures: Degenerative changes in the lower thoracic and upper lumbar spine. There is 4 mm of anterolisthesis of L3 on L4.  IMPRESSION: 1. Left inguinal hematoma. This does not extend into the peritoneal cavity. 2. Midline abdominal hernia contains mesenteric fat and nondilated loops of small bowel. 3. Distended urinary bladder. 4. Normal appendix. 5. Scattered emphysematous changes. 6. Coronary artery calcifications. 7. Calcified granulomata in the liver. 8. Benign right adrenal gland lesion.   Electronically Signed   By: Rosalie Gums M.D.   On: 03/06/2014 01:07   Dg Chest Portable 1 View  03/10/2014   CLINICAL DATA:  Evaluate airspace disease and endotracheal tube position  EXAM: PORTABLE CHEST - 1 VIEW  COMPARISON:  Portable chest x-ray of 03/09/2014  FINDINGS: There is little change in diffuse airspace disease most consistent with edema. Cardiomegaly and probable small effusions remain. The endotracheal tube tip is approximately 4.9 cm above the carina. NG tube extends below the hemidiaphragm.  IMPRESSION: Little change in poor aeration in diffuse airspace disease most consistent with edema with probable effusions.   Electronically Signed   By: Dwyane Dee M.D.   On: 03/10/2014 07:51   Dg Chest Port 1 View  03/09/2014   CLINICAL DATA:  Respiratory distress.  EXAM: PORTABLE CHEST - 1 VIEW  COMPARISON:  03/09/2014  FINDINGS: Endotracheal tube is in place with tip approximately 3 cm above the carina. Left IJ central line tip overlies the level of the superior vena cava. Nasogastric tube is in place with tip off the film but beyond the gastroesophageal junction.  The heart is enlarged. There are central airspace filling opacity similar in appearance to the prior study.  IMPRESSION: Persistent bilateral airspace filled opacities.   Electronically Signed   By: Rosalie Gums M.D.   On: 03/09/2014 19:59   Dg Chest  Port 1 View  03/09/2014   CLINICAL DATA:  Pulmonary edema.  EXAM: PORTABLE CHEST - 1 VIEW  COMPARISON:  Same day.  FINDINGS: Stable mild cardiomegaly. Endotracheal tube is in grossly good position with distal tip approximately 7 cm above the carina. Nasogastric tube is seen entering stomach. Left internal jugular catheter line is noted with distal tip in expected position of the SVC. Bilateral perihilar and basilar interstitial densities are noted which are improved compared to prior exam. This is in consistent with improving pulmonary edema. No pneumothorax or significant pleural effusion is noted.  IMPRESSION: Mildly improved bilateral pulmonary edema.   Electronically Signed   By: Roque Lias M.D.   On: 03/09/2014 13:49   Dg Chest Port 1 View  03/09/2014   CLINICAL DATA:  Reassess known airspace disease and support tube positioning  EXAM: PORTABLE CHEST - 1 VIEW  COMPARISON:  Portable chest x-ray of 08 March 2014  FINDINGS: The lungs are adequately inflated. Widespread airspace consolidation is present but may have slightly improved since yesterday's study. The hemidiaphragms are obscured. There is no pneumothorax. The cardiac silhouette is mildly enlarged. The pulmonary vascularity is engorged.  The endotracheal tube tip lies 3 cm above the crotch of the carina. The left internal jugular venous catheter tip projects over the junction of the proximal and midportions of the SVC. The esophagogastric tube tip projects off the inferior margin of the image.  IMPRESSION: 1. Stable to slightly improved appearance of the pulmonary interstitial edema. 2. The support tubes and lines are in reasonable position.   Electronically Signed   By: David  [place]   On: 03/09/2014 07:50   Dg Chest Port 1 View  03/08/2014   CLINICAL DATA:  Evaluate endotracheal tube position  EXAM: PORTABLE CHEST - 1 VIEW  COMPARISON:  Portable chest x-ray of 03/07/2014  FINDINGS: There has been worsening of airspace disease bilaterally  which is primarily perihilar in location most consistent with pulmonary edema and probable effusions. Mild cardiomegaly is stable. The tip of the endotracheal tube is approximately 2.8 cm above the carina. Left IJ central venous catheter remains unchanged in position  IMPRESSION: 1. Worsening of pulmonary edema with probable effusions. 2. Tip of endotracheal tube approximately 2.8 cm above the carina.   Electronically Signed   By: Dwyane Dee M.D.   On: 03/08/2014 08:08   Portable Chest Xray In Am  03/07/2014  CLINICAL DATA:  Hypoxia  EXAM: PORTABLE CHEST - 1 VIEW  COMPARISON:  March 06, 2014  FINDINGS: Endotracheal tube tip is 2.5 cm above the carina. Central catheter tip is at the junction of the left innominate vein and superior vena cava. Nasogastric tube tip and side port are below the diaphragm. No pneumothorax. There is interstitial edema with cardiomegaly and pulmonary venous hypertension. There is a right effusion. There is a minimal left effusion as well. There is mild airspace consolidation in the left base. No adenopathy.  IMPRESSION: Tube and catheter positions as described without pneumothorax. Evidence of a degree of congestive heart failure. Question alveolar edema versus superimposed pneumonia left base.   Electronically Signed   By: Bretta Bang M.D.   On: 03/07/2014 07:23   Dg Chest Port 1 View  03/06/2014   CLINICAL DATA:  Endotracheal tube and central line placement.  EXAM: PORTABLE CHEST - 1 VIEW  COMPARISON:  Same day.  FINDINGS: Endotracheal tube is in grossly good position with distal tip 4 cm above the carina. Left internal jugular catheter line is noted with distal tip in expected position of the SVC. No pneumothorax is noted. Stable bilateral perihilar and basilar opacities are noted concerning for edema or pneumonia.  IMPRESSION: No pneumothorax status post left internal jugular catheter line placement. Endotracheal tube in grossly good position. These results will be  called to the ordering clinician or representative by the Radiologist Assistant, and communication documented in the PACS or zVision Dashboard.   Electronically Signed   By: Roque Lias M.D.   On: 03/06/2014 13:02   Dg Chest Port 1 View  03/06/2014   CLINICAL DATA:  Shortness of breath.  EXAM: PORTABLE CHEST - 1 VIEW  COMPARISON:  Chest radiograph performed 03/05/2014  FINDINGS: The lungs are well-aerated. Right basilar airspace opacity may reflect pneumonia or mild pulmonary edema. Underlying vascular congestion is noted. A small right pleural effusion is suspected. No pneumothorax is seen.  The cardiomediastinal silhouette is borderline enlarged. No acute osseous abnormalities are seen.  IMPRESSION: 1. Right basilar airspace opacity may reflect pneumonia or mild pulmonary edema. Small right pleural effusion suspected. 2. Underlying vascular congestion and borderline cardiomegaly.   Electronically Signed   By: Roanna Raider M.D.   On: 03/06/2014 06:48   Dg Chest Portable 1 View  03/05/2014   CLINICAL DATA:  Leg pain.  Smoker.  EXAM: PORTABLE CHEST - 1 VIEW  COMPARISON:  Chest radiograph 09/25/2013  FINDINGS: Cardiac leads project over the chest. Cardiomediastinal silhouette is stable. There is atherosclerotic calcification in the thoracic aortic arch. Lung volumes are low resulting in crowding of pulmonary vascular markings. No airspace disease or pulmonary edema is identified. No acute bony abnormality appreciated.  IMPRESSION: Low lung volumes.  No acute findings identified.  Atherosclerosis of the thoracic aorta.   Electronically Signed   By: Britta Mccreedy M.D.   On: 03/05/2014 19:15   Dg Abd Portable 1v  03/08/2014   CLINICAL DATA:  Orogastric tube placement  EXAM: PORTABLE ABDOMEN - 1 VIEW  COMPARISON:  None.  FINDINGS: Orogastric tube tip and side port are in the stomach. There is contrast in the colon. The bowel gas pattern is unremarkable. No obstruction or free air appreciable.  IMPRESSION:  Orogastric tube tip and side port are in the stomach. Bowel gas pattern unremarkable.   Electronically Signed   By: Bretta Bang M.D.   On: 03/08/2014 16:09   Dg Abd Portable 1v  03/06/2014  CLINICAL DATA:  NG tube placement.  EXAM: PORTABLE ABDOMEN - 1 VIEW  COMPARISON:  03/06/2014  FINDINGS: The endotracheal tube is 2.7 cm above the carina. The left IJ catheter tip is in the mid SVC. The NG tube tip is in the stomach. Persistent edema and right lower lobe airspace process.  IMPRESSION: The NG tube is in the stomach.  ETT is 2.7 cm above the carina.  Left IJ catheter tip is in the mid SVC.   Electronically Signed   By: Loralie Champagne M.D.   On: 03/06/2014 15:43   Dg Foot Complete Left  03/05/2014   CLINICAL DATA:  Left foot pain. Soft tissue ulceration. Redness and discoloration.  EXAM: LEFT FOOT - COMPLETE 3+ VIEW  COMPARISON:  08/10/2012  FINDINGS: There is no fracture or dislocation or bone destruction suggestive of osteomyelitis. There are 2 screws in the distal tibia and a side plate and multiple screws in the distal fibula. 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 Small plantar calcaneal spur.  IMPRESSION: No acute abnormalities.  No change since the prior exam.   Electronically Signed   By: Geanie Cooley M.D.   On: 03/05/2014 22:09    ASSESSMENT AND PLAN  1Severe ischemic cardiomyopathy with severe 3 vessel CAD by cath yesterday. EF 15%. Recent STEMI with peak troponin 16.99 2. Acute respiratory failure, COPD and pneumonia. 3. Peripheral arterial occlusive disease. 4. Anemia. 5. Staph bacteremia. 6. Hypokalemia  Plan: Continue efforts to diurese with IV lasix. With her severe LV systolic dysfunction will add low dose ACEi. Renal function normal. She may benefit from transfusion of packed cells soon. Potassium repletion per primary team. If she can be stabilized will need to be seen by CT surgeons re ? CABG  Signed, Cassell Clement MD

## 2014-03-11 NOTE — Progress Notes (Addendum)
ANTICOAGULATION CONSULT NOTE - Follow Up Consult  Pharmacy Consult for Heparin  Indication: chest pain/ACS  Allergies  Allergen Reactions  . Penicillins Hives    Tolerates cefepime  . Adhesive [Tape] Rash    Patient Measurements: Height:  (157.5 cm) Weight: 208 lb 15.9 oz (94.8 kg) IBW/kg (Calculated) : 50.1 Heparin Dosing Weight: 72 kg  Vital Signs: Temp: 98 F (36.7 C) (09/19 1600) Temp src: Oral (09/19 1600) BP: 133/59 mmHg (09/19 1500) Pulse Rate: 92 (09/19 1500)  Labs:  Recent Labs  03/09/14 0410 03/10/14 0425 03/11/14 0440 03/11/14 1130 03/11/14 1755  HGB 8.3* 8.9* 8.1*  --   --   HCT 26.5* 28.8* 26.2*  --   --   PLT 229 288 265  --   --   HEPARINUNFRC  --   --   --  0.44 0.82*  CREATININE 0.44* 0.43*  --  0.45*  --     Estimated Creatinine Clearance: 84.3 ml/min (by C-G formula based on Cr of 0.45).   Assessment: Diffuse CAD found. Heparin was resumed last PM. Heparin level went up this PM for some reason even though the rate is the same.   Goal of Therapy:  Heparin level 0.3-0.7 units/ml Monitor platelets by anticoagulation protocol: Yes   Plan:   Decrease heparin to 1600 units/hr F/u with heparin level in AM  Ulyses Southward, PharmD Pager: [phone] 03/11/2014 6:40 PM

## 2014-03-11 NOTE — Progress Notes (Signed)
PULMONARY / CRITICAL CARE MEDICINE   Name: [name] MRN: [id number] DOB: 09-12-1955    ADMISSION DATE:  03/05/2014 CONSULTATION DATE:  9/14  REFERRING MD :  [facility]  CHIEF COMPLAINT: Left leg pain  INITIAL PRESENTATION: Left leg pain, hypotension and hypoxic.  STUDIES:  9/14  Echo- EF 30-35% 9/16  TEE- no LAA thrombus, no valvular vegetation, anterior, anteroseptal, apical, and inferioapical hypokinesis, EF 35% 9/17 EKG- sinus tach, low voltage QRS, cannot r/o anteroseptal infarct  SIGNIFICANT EVENTS: 9/14 hypoxia, hypotension . ++ Trop I 9/14 intubated 9/15 pos BC MSSA  9/16 TEE>>> see above 9/17- neg 2 liters, still bronchospastic 9/18 cath - severe 3VD, EF 15%  SUBJECTIVE: weans well Off versed gtt Good UO with lasix   VITAL SIGNS: Temp:  [98 F (36.7 C)-98.6 F (37 C)] 98.4 F (36.9 C) (09/19 0802) Pulse Rate:  [66-123] 92 (09/19 0900) Resp:  [13-19] 14 (09/19 0900) BP: (93-132)/(47-69) 119/61 mmHg (09/19 0900) SpO2:  [95 %-100 %] 96 % (09/19 0900) FiO2 (%):  [40 %] 40 % (09/19 0800) Weight:  [94.8 kg (208 lb 15.9 oz)-96.7 kg (213 lb 3 oz)] 94.8 kg (208 lb 15.9 oz) (09/19 0500) HEMODYNAMICS:   VENTILATOR SETTINGS: Vent Mode:  [-] CPAP;PSV FiO2 (%):  [40 %] 40 % Set Rate:  [14 bmp] 14 bmp Vt Set:  [550 mL] 550 mL PEEP:  [5 cmH20] 5 cmH20 Pressure Support:  [5 cmH20] 5 cmH20 Plateau Pressure:  [17 cmH20-19 cmH20] 19 cmH20 INTAKE / OUTPUT:  Intake/Output Summary (Last 24 hours) at 03/11/14 1125 Last data filed at 03/11/14 0900  Gross per 24 hour  Intake 1607.93 ml  Output   4740 ml  Net -3132.07 ml    PHYSICAL EXAMINATION: General:  Obese female on vent, diaphoretic Neuro:  rass 0 HEENT:  JVD Cardiovascular: s1 s2, tachycardic Lungs:  No rhonchi, scattered crackles Abdomen:  nonTender, large ecchymotic area left femoral area and into abd. Hypo BS Musculoskeletal:  Lower ext PVD changes with multiple healing ulcers. Pulses palp,  Skin: Ulcers  as noted, dry, R foot cool to touch, L warm and edematous  LABS:  CBC  Recent Labs Lab 03/09/14 0410 03/10/14 0425 03/11/14 0440  WBC 8.4 10.6* 7.9  HGB 8.3* 8.9* 8.1*  HCT 26.5* 28.8* 26.2*  PLT 229 288 265   Coag's  Recent Labs Lab 03/06/14 1300  INR 1.45   BMET  Recent Labs Lab 03/08/14 0501 03/09/14 0410 03/10/14 0425  NA 134* 140 143  K 3.9 3.4* 3.2*  CL 100 103 98  CO2 21 26 34*  BUN CREATININE 0.41* 0.44* 0.43*  GLUCOSE 220* 153* 297*   Electrolytes  Recent Labs Lab 03/08/14 0501 03/09/14 0410 03/10/14 0425  CALCIUM 7.9* 8.0* 8.4  MG 2.2 1.9 2.0  PHOS 1.7* 2.6 2.6   Sepsis Markers  Recent Labs Lab 03/05/14 2308 03/06/14 0415  03/06/14 1400 03/06/14 1938 03/07/14 0500 03/08/14 0501  LATICACIDVEN 1.5 0.8  --  1.1  --   --   --   PROCALCITON  --   --   < >  --  1.59 1.34 0.79  < > = values in this interval not displayed. ABG  Recent Labs Lab 03/07/14 0936 03/08/14 0500 03/09/14 1031  PHART 7.313* 7.346* 7.304*  PCO2ART 40.3 40.2 54.6*  PO2ART 76.0* 114.0* 124.0*   Liver Enzymes  Recent Labs Lab 03/05/14 1650 03/06/14 0415 03/08/14 0501  AST 31 41* 41*  ALT 11  15 50*  ALKPHOS 78 64 72  BILITOT 0.7 0.6 0.3  ALBUMIN 3.0* 2.4* 2.2*   Cardiac Enzymes  Recent Labs Lab 03/06/14 0415 03/06/14 1015 03/06/14 1718  TROPONINI 6.05* 10.57* 16.99*  PROBNP  --  8842.0*  --    Glucose  Recent Labs Lab 03/10/14 1215 03/10/14 1637 03/10/14 1951 03/10/14 2357 03/11/14 0422 03/11/14 0742  GLUCAP 178* 214* 219* 205* 242* 292*    Imaging Dg Chest Portable 1 View  03/10/2014   CLINICAL DATA:  Evaluate airspace disease and endotracheal tube position  EXAM: PORTABLE CHEST - 1 VIEW  COMPARISON:  Portable chest x-ray of 03/09/2014  FINDINGS: There is little change in diffuse airspace disease most consistent with edema. Cardiomegaly and probable small effusions remain. The endotracheal tube tip is approximately 4.9 cm  above the carina. NG tube extends below the hemidiaphragm.  IMPRESSION: Little change in poor aeration in diffuse airspace disease most consistent with edema with probable effusions.   Electronically Signed   By: Dwyane Dee M.D.   On: 03/10/2014 07:51     ASSESSMENT / PLAN:  PULMONARY OETT>>9/14>> A: Acute hypoxic resp failure due to pulm edema Tobacco abuse HCAP (evolving infiltrate RLL) AE COPD? > Emphysema on CT scan, no prior diagnosis of COPD COPD exac likely and edema contribution to bronchospasm  P:   BD scheduled and PRN, increased frequency Drop Solumedrol  40 q6h SBT - with goal extubation  CARDIOVASCULAR CVL>>9/14>> A:  NSTEMI>+ trop I 6.5 9/14 incidental finding. Septic shock- resolving 3VD , stent 15 yrs ago R/o endocarditis-neg per TEE  P:  Cont lasix , lisinopril added Morphine  IV heparin Control HR important, avoid BB , on cardizem gtt   RENAL A:   Hypo K, hypomag hypo phos- improved pulm edema P:   Lasix  40 q 8h To neg balance  GASTROINTESTINAL A:   feeds P:   pepcid TF at goal  HEMATOLOGIC A:   Chronic anemia L inguinal hematoma  P:  transfuse per protocol for hgb <7.0 Monitor left groin closely sub q heparin   INFECTIOUS A:  Bacteremia, MSSA, related to recent angio? Vascular in nature unclear Septic shock resolved R/o HCAP TEE neg  P:   BCx2  9/14>>MSSA UC  9/14>>negative WC 9/13>>pseudomonas Sputum Abx: Vanc  , start date 9/14>>>9/17 Abx: cefepime, start date 9/14>>> Ancef 9/15>>>9/15 BC x2 9/15>>neg  If pcxr not improved with lasix, consider CT chest to define if MSSA source from lungs, low clinical suspcion [place] of vascular graft RLE at some point for source of MSSA  ENDOCRINE A:   DM last Hbg a1c  7.2 NICE goal 140-180 P:   Insulin drip off - lantus 10 then mod ssi  Dropping steroids should help  NEUROLOGIC A:  Vent dyschrony P:   fent gtt   TODAY'S SUMMARY:  Neg balance, close to extubation  I  have personally obtained a history, examined the patient, evaluated laboratory and imaging results, formulated the assessment and plan and placed orders. CRITICAL CARE: The patient is critically ill with multiple organ systems failure and requires high complexity decision making for assessment and support, frequent evaluation and titration of therapies, application of advanced monitoring technologies and extensive interpretation of multiple databases. Critical Care Time devoted to patient care services described in this note is 35 minutes.    Cyril Mourning MD. Tonny Bollman. [facility] Pager [phone] If no response call [phone]

## 2014-03-12 ENCOUNTER — Inpatient Hospital Stay (HOSPITAL_COMMUNITY): Payer: BC Managed Care – PPO

## 2014-03-12 LAB — HEPARIN LEVEL (UNFRACTIONATED): Heparin Unfractionated: 0.87 IU/mL — ABNORMAL HIGH (ref 0.30–0.70)

## 2014-03-12 LAB — GLUCOSE, CAPILLARY
GLUCOSE-CAPILLARY: 211 mg/dL — AB (ref 70–99)
Glucose-Capillary: 193 mg/dL — ABNORMAL HIGH (ref 70–99)
Glucose-Capillary: 215 mg/dL — ABNORMAL HIGH (ref 70–99)
Glucose-Capillary: 220 mg/dL — ABNORMAL HIGH (ref 70–99)
Glucose-Capillary: 233 mg/dL — ABNORMAL HIGH (ref 70–99)
Glucose-Capillary: 239 mg/dL — ABNORMAL HIGH (ref 70–99)

## 2014-03-12 LAB — CBC
HCT: 28.9 % — ABNORMAL LOW (ref 36.0–46.0)
Hemoglobin: 8.6 g/dL — ABNORMAL LOW (ref 12.0–15.0)
MCH: 21.6 pg — AB (ref 26.0–34.0)
MCHC: 29.8 g/dL — ABNORMAL LOW (ref 30.0–36.0)
MCV: 72.6 fL — ABNORMAL LOW (ref 78.0–100.0)
PLATELETS: 293 10*3/uL (ref 150–400)
RBC: 3.98 MIL/uL (ref 3.87–5.11)
RDW: 18.5 % — ABNORMAL HIGH (ref 11.5–15.5)
WBC: 7.7 10*3/uL (ref 4.0–10.5)

## 2014-03-12 LAB — BASIC METABOLIC PANEL
ANION GAP: 10 (ref 5–15)
BUN: 29 mg/dL — ABNORMAL HIGH (ref 6–23)
CALCIUM: 8.5 mg/dL (ref 8.4–10.5)
CHLORIDE: 99 meq/L (ref 96–112)
CO2: 39 mEq/L — ABNORMAL HIGH (ref 19–32)
CREATININE: 0.43 mg/dL — AB (ref 0.50–1.10)
GFR calc non Af Amer: >90 mL/min (ref >90)
Glucose, Bld: 198 mg/dL — ABNORMAL HIGH (ref 70–99)
Potassium: 3.2 mEq/L — ABNORMAL LOW (ref 3.7–5.3)
Sodium: 148 mEq/L — ABNORMAL HIGH (ref 137–147)

## 2014-03-12 MED ORDER — SODIUM CHLORIDE 0.9 % IV SOLN
INTRAVENOUS | Status: DC
  Administered 2014-03-12 – 2014-03-24 (×5): via INTRAVENOUS

## 2014-03-12 MED ORDER — POTASSIUM CHLORIDE 20 MEQ/15ML (10%) PO LIQD
ORAL | Status: AC
  Filled 2014-03-12: qty 15

## 2014-03-12 MED ORDER — SODIUM CHLORIDE 3 % IV SOLN: INTRAVENOUS | Status: DC

## 2014-03-12 MED ORDER — POTASSIUM CHLORIDE 10 MEQ/50ML IV SOLN
10.0000 meq | INTRAVENOUS | Status: AC
  Administered 2014-03-12 (×3): 10 meq via INTRAVENOUS
  Filled 2014-03-12 (×2): qty 50

## 2014-03-12 MED ORDER — FENTANYL CITRATE 0.05 MG/ML IJ SOLN
25.0000 ug | INTRAMUSCULAR | Status: DC | PRN
Start: 2014-03-12 — End: 2014-03-15
  Filled 2014-03-12: qty 2

## 2014-03-12 MED ORDER — METHYLPREDNISOLONE SODIUM SUCC 40 MG IJ SOLR
40.0000 mg | INTRAMUSCULAR | Status: DC
  Administered 2014-03-13: 40 mg via INTRAVENOUS
  Filled 2014-03-12 (×2): qty 1

## 2014-03-12 MED ORDER — ENOXAPARIN SODIUM 100 MG/ML ~~LOC~~ SOLN
95.0000 mg | Freq: Two times a day (BID) | SUBCUTANEOUS | Status: DC
  Administered 2014-03-12 – 2014-03-13 (×3): 95 mg via SUBCUTANEOUS
  Filled 2014-03-12 (×4): qty 1

## 2014-03-12 MED ORDER — POTASSIUM CHLORIDE 20 MEQ/15ML (10%) PO LIQD
40.0000 meq | Freq: Once | ORAL | Status: DC
  Filled 2014-03-12 (×3): qty 30

## 2014-03-12 MED ORDER — ENALAPRILAT 1.25 MG/ML IV SOLN
0.6250 mg | Freq: Four times a day (QID) | INTRAVENOUS | Status: DC
  Administered 2014-03-12 – 2014-03-14 (×8): 0.625 mg via INTRAVENOUS
  Filled 2014-03-12 (×2): qty 0.5
  Filled 2014-03-12: qty 1
  Filled 2014-03-12 (×2): qty 0.5
  Filled 2014-03-12 (×2): qty 1
  Filled 2014-03-12 (×4): qty 0.5

## 2014-03-12 MED ORDER — METOPROLOL TARTRATE 1 MG/ML IV SOLN
2.5000 mg | Freq: Four times a day (QID) | INTRAVENOUS | Status: DC
  Administered 2014-03-12 – 2014-03-14 (×8): 2.5 mg via INTRAVENOUS
  Filled 2014-03-12 (×11): qty 5

## 2014-03-12 NOTE — Progress Notes (Signed)
ANTICOAGULATION CONSULT NOTE  Pharmacy Consult for Heparin  Indication: chest pain/ACS  Allergies  Allergen Reactions  . Penicillins Hives    Tolerates cefepime  . Adhesive [Tape] Rash    Patient Measurements: Height:  (157.5 cm) Weight: 208 lb 15.9 oz (94.8 kg) IBW/kg (Calculated) : 50.1 Heparin Dosing Weight: 72 kg  Vital Signs: Temp: 98.2 F (36.8 C) (09/20 0427) Temp src: Oral (09/20 0427) BP: 123/60 mmHg (09/20 0000) Pulse Rate: 73 (09/20 0000)  Labs:  Recent Labs  03/10/14 0425 03/11/14 0440 03/11/14 1130 03/11/14 1755 03/12/14 0355  HGB 8.9* 8.1*  --   --  8.6*  HCT 28.8* 26.2*  --   --  28.9*  PLT 288 265  --   --  293  HEPARINUNFRC  --   --  0.44 0.82* 0.87*  CREATININE 0.43*  --  0.45*  --  0.43*    Estimated Creatinine Clearance: 84.3 ml/min (by C-G formula based on Cr of 0.43).  Assessment: 58 yo female with NSTEMI s/p cath awaiting possible CABG, for heparin  Goal of Therapy:  Heparin level 0.3-0.7 units/ml Monitor platelets by anticoagulation protocol: Yes   Plan:  Decrease heparin 1400 units/hr Check heparin level in 8 hours.   Geannie Risen, PharmD, BCPS   03/12/2014 5:06 AM

## 2014-03-12 NOTE — Progress Notes (Signed)
Patient Name: [name] Date of Encounter: 03/12/2014     Principal Problem:   Sepsis Active Problems:   Left leg pain   Anemia   Diabetes mellitus, controlled   NSTEMI (non-ST elevated myocardial infarction)    SUBJECTIVE  She is extubated. Breathing appears comfortable. Denies chest pain. NG tube is out. Not taking any oral meds yet.  CURRENT MEDS . antiseptic oral rinse  7 mL Mouth Rinse QID  . aspirin  81 mg Oral Daily  . atorvastatin  40 mg Per Tube q1800  . ceFEPime (MAXIPIME) IV  2 g Intravenous Q12H  . chlorhexidine  15 mL Mouth Rinse BID  . darifenacin  7.5 mg Oral Daily  . enoxaparin (LOVENOX) injection  95 mg Subcutaneous Q12H  . escitalopram  10 mg Per Tube Daily  . famotidine (PEPCID) IV  20 mg Intravenous Q12H  . furosemide  40 mg Intravenous TID  . insulin aspart  0-15 Units Subcutaneous 6 times per day  . insulin glargine  10 Units Subcutaneous Daily  . ipratropium-albuterol  3 mL Nebulization Q6H  . lisinopril  2.5 mg Oral Daily  . [START ON 03/13/2014] methylPREDNISolone (SOLU-MEDROL) injection  40 mg Intravenous Q24H  . potassium chloride  40 mEq Per Tube Once    OBJECTIVE  Filed Vitals:   03/12/14 0700 03/12/14 0737 03/12/14 0800 03/12/14 0900  BP: 142/67  143/74 139/64  Pulse: 72  85 78  Temp:  98.5 F (36.9 C)    TempSrc:  Oral    Resp: 20  20 21   Height:      Weight:      SpO2: 97%  96% 91%    Intake/Output Summary (Last 24 hours) at 03/12/14 1137 Last data filed at 03/12/14 0900  Gross per 24 hour  Intake 1012.82 ml  Output   3630 ml  Net -2617.18 ml   Filed Weights   03/10/14 1310 03/11/14 0500 03/12/14 0500  Weight: 213 lb 3 oz (96.7 kg) 208 lb 15.9 oz (94.8 kg) 202 lb 13.2 oz (92 kg)    PHYSICAL EXAM  General: Pleasant, NAD. Neuro: Alert and oriented X 3. Moves all extremities spontaneously. Psych: Normal affect. HEENT:  Normal  Neck: Supple without bruits or JVD. Lungs:  Resp regular and unlabored, Clear  anteriorly Heart: RRR Soft S3 gallop. Abdomen: Soft, non-tender, non-distended, BS + x 4.  Extremities: : No clubbing, cyanosis or edema. Weak pedal pulses. Bilaterally. Right ankle ulcer is covered with dressing   Accessory Clinical Findings  CBC  Recent Labs  03/11/14 0440 03/12/14 0355  WBC 7.9 7.7  HGB 8.1* 8.6*  HCT 26.2* 28.9*  MCV 71.2* 72.6*  PLT 265 293   Basic Metabolic Panel  Recent Labs  03/10/14 0425 03/11/14 1130 03/12/14 0355  NA 143 148* 148*  K 3.2* 3.5* 3.2*  CL 98 100 99  CO2 34* 37* 39*  GLUCOSE 297* 317* 198*  BUN 17 28* 29*  CREATININE 0.43* 0.45* 0.43*  CALCIUM 8.4 8.5 8.5  MG 2.0  --   --   PHOS 2.6  --   --    Liver Function Tests No results found for this basename: AST, ALT, ALKPHOS, BILITOT, PROT, ALBUMIN,  in the last 72 hours No results found for this basename: LIPASE, AMYLASE,  in the last 72 hours Cardiac Enzymes No results found for this basename: CKTOTAL, CKMB, CKMBINDEX, TROPONINI,  in the last 72 hours BNP No components found with this basename: POCBNP,  D-Dimer No results found for this basename: DDIMER,  in the last 72 hours Hemoglobin A1C No results found for this basename: HGBA1C,  in the last 72 hours Fasting Lipid Panel No results found for this basename: CHOL, HDL, LDLCALC, TRIG, CHOLHDL, LDLDIRECT,  in the last 72 hours Thyroid Function Tests No results found for this basename: TSH, T4TOTAL, FREET3, T3FREE, THYROIDAB,  in the last 72 hours  TELE  NSR 85/min  ECG    Radiology/Studies  Dg Tibia/fibula Left  03/05/2014   CLINICAL DATA:  Lower leg pain.  EXAM: LEFT TIBIA AND FIBULA - 2 VIEW  COMPARISON:  Screws in the distal tibia and side plate and screws in the distal fibula. The fractures have healed. 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 No acute osseous abnormality. Extensive vascular calcifications and dermal calcifications  in the lower leg.  FINDINGS: No acute abnormalities. Arthritis at the knee and ankle. Postsurgical changes.  IMPRESSION: Negative.   Electronically Signed   By: Geanie Cooley M.D.   On: 03/05/2014 22:10   Ct Abdomen Pelvis W Contrast  03/06/2014   CLINICAL DATA:  Left flank pain began after recent vascular catheterization. Pain in the entire left leg. No fever, chills, nausea, vomiting.  EXAM: CT ABDOMEN AND PELVIS WITH CONTRAST  TECHNIQUE: Multidetector CT imaging of the abdomen and pelvis was performed using the standard protocol following bolus administration of intravenous contrast.  CONTRAST:  OMNIPAQUE IOHEXOL 300 MG/ML  SOLN  COMPARISON:  07/25/2013  FINDINGS: Lower chest: Dependent changes are seen in the lung bases. Scattered emphysematous changes are also present. There is atherosclerotic calcification of the coronary vessels. Heart size is normal. No pericardial effusion.  Upper abdomen: Scattered calcified granulomata are identified within the liver. No focal abnormality identified within the spleen, pancreas, or left adrenal gland. There is a low-attenuation lesion adjacent to or involving the right adrenal gland, consistent with a benign process. The patient has had previous cholecystectomy. Bilateral renal cysts are noted.  Bowel: Stomach has a normal appearance. A midline abdominal hernia contains mesenteric fat and nondilated loops of small bowel and appears stable over prior studies. Colonic loops are normal in appearance. There is moderate stool burden. The appendix has a normal appearance.  Pelvis: There is moderate stranding in the subcutaneous tissues and abdominal wall of the left inguinal region. Findings are consistent with edema/ hematoma after recent catheterization. Age no evidence for intraperitoneal hematoma. The uterus is present. No adnexal mass. The urinary bladder is distended.  Retroperitoneum: No retroperitoneal or mesenteric adenopathy. There is dense atherosclerotic  calcification of the abdominal aorta. No aneurysm. Right iliac stent.  Osseous structures: Degenerative changes in the lower thoracic and upper lumbar spine. There is 4 mm of anterolisthesis of L3 on L4.  IMPRESSION: 1. Left inguinal hematoma. This does not extend into the peritoneal cavity. 2. Midline abdominal hernia contains mesenteric fat and nondilated loops of small bowel. 3. Distended urinary bladder. 4. Normal appendix. 5. Scattered emphysematous changes. 6. Coronary artery calcifications. 7. Calcified granulomata in the liver. 8. Benign right adrenal gland lesion.   Electronically Signed   By: Rosalie Gums M.D.   On: 03/06/2014 01:07   Dg Chest Port 1 View  03/12/2014   CLINICAL DATA:  Follow-up pulmonary edema and effusions.  EXAM: PORTABLE CHEST - 1 VIEW  COMPARISON:  Portable chest x-rays yesterday dating back to 03/07/2014.  FINDINGS: Cardiac silhouette mildly enlarged  but stable. Interval improvement in the interstitial and airspace pulmonary edema, though mild edema persists. Stable bilateral pleural effusions, right greater than left and dense consolidation in the lower lobes. No new pulmonary parenchymal abnormalities. Left jugular central venous catheter tip projects over the upper SVC, unchanged.  IMPRESSION: Support apparatus satisfactory. Improved CHF, with mild interstitial pulmonary edema persisting. Stable bilateral pleural effusions, right greater than left and associated dense passive atelectasis in the lower lobes. No new abnormalities.   Electronically Signed   By: Hulan Saas M.D.   On: 03/12/2014 08:15   Dg Chest Port 1 View  03/11/2014   CLINICAL DATA:  Pneumonitis.  EXAM: PORTABLE CHEST - 1 VIEW  COMPARISON:  03/10/2014  FINDINGS: Endotracheal and enteric tubes have been removed. Left jugular central venous catheter tip overlies the upper SVC. The cardiac silhouette remains enlarged. Pulmonary vascular congestion persists with similar appearance of bilateral airspace  opacities and likely small bilateral pleural effusions. No pneumothorax.  IMPRESSION: No significant interval change in the appearance of the lungs, most suggestive of pulmonary edema and small bilateral pleural effusions.   Electronically Signed   By: Sebastian Ache   On: 03/11/2014 13:25   Dg Chest Portable 1 View  03/10/2014   CLINICAL DATA:  Evaluate airspace disease and endotracheal tube position  EXAM: PORTABLE CHEST - 1 VIEW  COMPARISON:  Portable chest x-ray of 03/09/2014  FINDINGS: There is little change in diffuse airspace disease most consistent with edema. Cardiomegaly and probable small effusions remain. The endotracheal tube tip is approximately 4.9 cm above the carina. NG tube extends below the hemidiaphragm.  IMPRESSION: Little change in poor aeration in diffuse airspace disease most consistent with edema with probable effusions.   Electronically Signed   By: Dwyane Dee M.D.   On: 03/10/2014 07:51   Dg Chest Port 1 View  03/09/2014   CLINICAL DATA:  Respiratory distress.  EXAM: PORTABLE CHEST - 1 VIEW  COMPARISON:  03/09/2014  FINDINGS: Endotracheal tube is in place with tip approximately 3 cm above the carina. Left IJ central line tip overlies the level of the superior vena cava. Nasogastric tube is in place with tip off the film but beyond the gastroesophageal junction.  The heart is enlarged. There are central airspace filling opacity similar in appearance to the prior study.  IMPRESSION: Persistent bilateral airspace filled opacities.   Electronically Signed   By: Rosalie Gums M.D.   On: 03/09/2014 19:59   Dg Chest Port 1 View  03/09/2014   CLINICAL DATA:  Pulmonary edema.  EXAM: PORTABLE CHEST - 1 VIEW  COMPARISON:  Same day.  FINDINGS: Stable mild cardiomegaly. Endotracheal tube is in grossly good position with distal tip approximately 7 cm above the carina. Nasogastric tube is seen entering stomach. Left internal jugular catheter line is noted with distal tip in expected position of the  SVC. Bilateral perihilar and basilar interstitial densities are noted which are improved compared to prior exam. This is in consistent with improving pulmonary edema. No pneumothorax or significant pleural effusion is noted.  IMPRESSION: Mildly improved bilateral pulmonary edema.   Electronically Signed   By: Roque Lias M.D.   On: 03/09/2014 13:49   Dg Chest Port 1 View  03/09/2014   CLINICAL DATA:  Reassess known airspace disease and support tube positioning  EXAM: PORTABLE CHEST - 1 VIEW  COMPARISON:  Portable chest x-ray of 08 March 2014  FINDINGS: The lungs are adequately inflated. Widespread airspace consolidation is present but may  have slightly improved since yesterday's study. The hemidiaphragms are obscured. There is no pneumothorax. The cardiac silhouette is mildly enlarged. The pulmonary vascularity is engorged.  The endotracheal tube tip lies 3 cm above the crotch of the carina. The left internal jugular venous catheter tip projects over the junction of the proximal and midportions of the SVC. The esophagogastric tube tip projects off the inferior margin of the image.  IMPRESSION: 1. Stable to slightly improved appearance of the pulmonary interstitial edema. 2. The support tubes and lines are in reasonable position.   Electronically Signed   By: David  [place]   On: 03/09/2014 07:50   Dg Chest Port 1 View  03/08/2014   CLINICAL DATA:  Evaluate endotracheal tube position  EXAM: PORTABLE CHEST - 1 VIEW  COMPARISON:  Portable chest x-ray of 03/07/2014  FINDINGS: There has been worsening of airspace disease bilaterally which is primarily perihilar in location most consistent with pulmonary edema and probable effusions. Mild cardiomegaly is stable. The tip of the endotracheal tube is approximately 2.8 cm above the carina. Left IJ central venous catheter remains unchanged in position  IMPRESSION: 1. Worsening of pulmonary edema with probable effusions. 2. Tip of endotracheal tube approximately 2.8  cm above the carina.   Electronically Signed   By: Dwyane Dee M.D.   On: 03/08/2014 08:08   Portable Chest Xray In Am  03/07/2014   CLINICAL DATA:  Hypoxia  EXAM: PORTABLE CHEST - 1 VIEW  COMPARISON:  March 06, 2014  FINDINGS: Endotracheal tube tip is 2.5 cm above the carina. Central catheter tip is at the junction of the left innominate vein and superior vena cava. Nasogastric tube tip and side port are below the diaphragm. No pneumothorax. There is interstitial edema with cardiomegaly and pulmonary venous hypertension. There is a right effusion. There is a minimal left effusion as well. There is mild airspace consolidation in the left base. No adenopathy.  IMPRESSION: Tube and catheter positions as described without pneumothorax. Evidence of a degree of congestive heart failure. Question alveolar edema versus superimposed pneumonia left base.   Electronically Signed   By: Bretta Bang M.D.   On: 03/07/2014 07:23   Dg Chest Port 1 View  03/06/2014   CLINICAL DATA:  Endotracheal tube and central line placement.  EXAM: PORTABLE CHEST - 1 VIEW  COMPARISON:  Same day.  FINDINGS: Endotracheal tube is in grossly good position with distal tip 4 cm above the carina. Left internal jugular catheter line is noted with distal tip in expected position of the SVC. No pneumothorax is noted. Stable bilateral perihilar and basilar opacities are noted concerning for edema or pneumonia.  IMPRESSION: No pneumothorax status post left internal jugular catheter line placement. Endotracheal tube in grossly good position. These results will be called to the ordering clinician or representative by the Radiologist Assistant, and communication documented in the PACS or zVision Dashboard.   Electronically Signed   By: Roque Lias M.D.   On: 03/06/2014 13:02   Dg Chest Port 1 View  03/06/2014   CLINICAL DATA:  Shortness of breath.  EXAM: PORTABLE CHEST - 1 VIEW  COMPARISON:  Chest radiograph performed 03/05/2014  FINDINGS:  The lungs are well-aerated. Right basilar airspace opacity may reflect pneumonia or mild pulmonary edema. Underlying vascular congestion is noted. A small right pleural effusion is suspected. No pneumothorax is seen.  The cardiomediastinal silhouette is borderline enlarged. No acute osseous abnormalities are seen.  IMPRESSION: 1. Right basilar airspace opacity may reflect pneumonia  or mild pulmonary edema. Small right pleural effusion suspected. 2. Underlying vascular congestion and borderline cardiomegaly.   Electronically Signed   By: Roanna Raider M.D.   On: 03/06/2014 06:48   Dg Chest Portable 1 View  03/05/2014   CLINICAL DATA:  Leg pain.  Smoker.  EXAM: PORTABLE CHEST - 1 VIEW  COMPARISON:  Chest radiograph 09/25/2013  FINDINGS: Cardiac leads project over the chest. Cardiomediastinal silhouette is stable. There is atherosclerotic calcification in the thoracic aortic arch. Lung volumes are low resulting in crowding of pulmonary vascular markings. No airspace disease or pulmonary edema is identified. No acute bony abnormality appreciated.  IMPRESSION: Low lung volumes.  No acute findings identified.  Atherosclerosis of the thoracic aorta.   Electronically Signed   By: Britta Mccreedy M.D.   On: 03/05/2014 19:15   Dg Abd Portable 1v  03/08/2014   CLINICAL DATA:  Orogastric tube placement  EXAM: PORTABLE ABDOMEN - 1 VIEW  COMPARISON:  None.  FINDINGS: Orogastric tube tip and side port are in the stomach. There is contrast in the colon. The bowel gas pattern is unremarkable. No obstruction or free air appreciable.  IMPRESSION: Orogastric tube tip and side port are in the stomach. Bowel gas pattern unremarkable.   Electronically Signed   By: Bretta Bang M.D.   On: 03/08/2014 16:09   Dg Abd Portable 1v  03/06/2014   CLINICAL DATA:  NG tube placement.  EXAM: PORTABLE ABDOMEN - 1 VIEW  COMPARISON:  03/06/2014  FINDINGS: The endotracheal tube is 2.7 cm above the carina. The left IJ catheter tip is in the  mid SVC. The NG tube tip is in the stomach. Persistent edema and right lower lobe airspace process.  IMPRESSION: The NG tube is in the stomach.  ETT is 2.7 cm above the carina.  Left IJ catheter tip is in the mid SVC.   Electronically Signed   By: Loralie Champagne M.D.   On: 03/06/2014 15:43   Dg Foot Complete Left  03/05/2014   CLINICAL DATA:  Left foot pain. Soft tissue ulceration. Redness and discoloration.  EXAM: LEFT FOOT - COMPLETE 3+ VIEW  COMPARISON:  08/10/2012  FINDINGS: There is no fracture or dislocation or bone destruction suggestive of osteomyelitis. There are 2 screws in the distal tibia and a side plate and multiple screws in the distal fibula. 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 Small plantar calcaneal spur.  IMPRESSION: No acute abnormalities.  No change since the prior exam.   Electronically Signed   By: Geanie Cooley M.D.   On: 03/05/2014 22:09    ASSESSMENT AND PLAN 1Severe ischemic cardiomyopathy with severe 3 vessel CAD by cath. EF 15%. Recent STEMI with peak troponin 16.99  2. Acute respiratory failure, COPD and pneumonia.  3. Peripheral arterial occlusive disease.  4. Anemia.  5. Staph bacteremia.  6. Hypokalemia  Plan: Continue efforts to diurese with IV lasix. With her severe LV systolic dysfunction will add low dose ACEi. Renal function normal.  Potassium repletion per primary team.  Consider consult by  CT surgeons Monday if she continues to improve. Will switch from cardizem to BB IV in view of low EF. Will use IV enalaprilat until able to resume po lisinopril.    Signed, Cassell Clement MD

## 2014-03-12 NOTE — Progress Notes (Signed)
eLink Physician-Brief Progress Note Patient Name: [name] DOB: 08/13/1955 MRN: [id number]   Date of Service  03/12/2014  HPI/Events of Note  K low   eICU Interventions  .  Give KCL supp via tube      Intervention Category Major Interventions: Electrolyte abnormality - evaluation and management  Shan Levans 03/12/2014, 5:12 AM

## 2014-03-12 NOTE — Progress Notes (Signed)
PULMONARY / CRITICAL CARE MEDICINE   Name: [name] MRN: [id number] DOB: Oct 20, 1955    ADMISSION DATE:  03/05/2014 CONSULTATION DATE:  9/14  REFERRING MD :  Triad  CHIEF COMPLAINT: Left leg pain  INITIAL PRESENTATION: Left leg pain, hypotension and hypoxic.  STUDIES:  9/14  Echo- EF 30-35% 9/16  TEE- no LAA thrombus, no valvular vegetation, anterior, anteroseptal, apical, and inferioapical hypokinesis, EF 35% 9/17 EKG- sinus tach, low voltage QRS, cannot r/o anteroseptal infarct  SIGNIFICANT EVENTS: 9/14 hypoxia, hypotension . ++ Trop I 9/14 intubated 9/15 pos BC MSSA  9/16 TEE>>> see above 9/17- neg 2 liters, still bronchospastic 9/18 cath - severe 3VD, EF 15%  SUBJECTIVE: tolerated extubation Remains weak, voice hoarse but improving Good UO with lasix   VITAL SIGNS: Temp:  [98 F (36.7 C)-98.5 F (36.9 C)] 98.5 F (36.9 C) (09/20 0737) Pulse Rate:  [71-124] 78 (09/20 0900) Resp:  [13-21] 21 (09/20 0900) BP: (117-143)/(54-80) 139/64 mmHg (09/20 0900) SpO2:  [91 %-100 %] 91 % (09/20 0900) Weight:  [92 kg (202 lb 13.2 oz)] 92 kg (202 lb 13.2 oz) (09/20 0500) HEMODYNAMICS:   VENTILATOR SETTINGS:   INTAKE / OUTPUT:  Intake/Output Summary (Last 24 hours) at 03/12/14 0933 Last data filed at 03/12/14 0900  Gross per 24 hour  Intake 1097.82 ml  Output   4030 ml  Net -2932.18 ml    PHYSICAL EXAMINATION: General:  Obese female on [place] , appears chronically ill Neuro:  rass 0, non focal HEENT:  JVD Cardiovascular: s1 s2, tachycardic Lungs:  No rhonchi, scattered crackles Abdomen:  nonTender, large ecchymotic area left femoral area and into abd. Hypo BS Musculoskeletal:  Lower ext PVD changes with multiple healing ulcers. Pulses palp,  Skin: Ulcers as noted, dry, R foot cool to touch, L warm and edematous  LABS:  CBC  Recent Labs Lab 03/10/14 0425 03/11/14 0440 03/12/14 0355  WBC 10.6* 7.9 7.7  HGB 8.9* 8.1* 8.6*  HCT 28.8* 26.2* 28.9*  PLT 288  265 293   Coag's  Recent Labs Lab 03/06/14 1300  INR 1.45   BMET  Recent Labs Lab 03/10/14 0425 03/11/14 1130 03/12/14 0355  NA 143 148* 148*  K 3.2* 3.5* 3.2*  CL 98 100 99  CO2 34* 37* 39*  BUN 17 28* 29*  CREATININE 0.43* 0.45* 0.43*  GLUCOSE 297* 317* 198*   Electrolytes  Recent Labs Lab 03/08/14 0501 03/09/14 0410 03/10/14 0425 03/11/14 1130 03/12/14 0355  CALCIUM 7.9* 8.0* 8.4 8.5 8.5  MG 2.2 1.9 2.0  --   --   PHOS 1.7* 2.6 2.6  --   --    Sepsis Markers  Recent Labs Lab 03/05/14 2308 03/06/14 0415  03/06/14 1400 03/06/14 1938 03/07/14 0500 03/08/14 0501  LATICACIDVEN 1.5 0.8  --  1.1  --   --   --   PROCALCITON  --   --   < >  --  1.59 1.34 0.79  < > = values in this interval not displayed. ABG  Recent Labs Lab 03/07/14 0936 03/08/14 0500 03/09/14 1031  PHART 7.313* 7.346* 7.304*  PCO2ART 40.3 40.2 54.6*  PO2ART 76.0* 114.0* 124.0*   Liver Enzymes  Recent Labs Lab 03/05/14 1650 03/06/14 0415 03/08/14 0501  AST 31 41* 41*  ALT 11 15 50*  ALKPHOS 78 64 72  BILITOT 0.7 0.6 0.3  ALBUMIN 3.0* 2.4* 2.2*   Cardiac Enzymes  Recent Labs Lab 03/06/14 0415 03/06/14 1015 03/06/14 1718  TROPONINI  6.05* 10.57* 16.99*  PROBNP  --  8842.0*  --    Glucose  Recent Labs Lab 03/11/14 1206 03/11/14 1613 03/11/14 1935 03/12/14 0018 03/12/14 0425 03/12/14 0723  GLUCAP 314* 259* 206* 220* 211* 215*    Imaging Dg Chest Port 1 View  03/11/2014   CLINICAL DATA:  Pneumonitis.  EXAM: PORTABLE CHEST - 1 VIEW  COMPARISON:  03/10/2014  FINDINGS: Endotracheal and enteric tubes have been removed. Left jugular central venous catheter tip overlies the upper SVC. The cardiac silhouette remains enlarged. Pulmonary vascular congestion persists with similar appearance of bilateral airspace opacities and likely small bilateral pleural effusions. No pneumothorax.  IMPRESSION: No significant interval change in the appearance of the lungs, most  suggestive of pulmonary edema and small bilateral pleural effusions.   Electronically Signed   By: Sebastian Ache   On: 03/11/2014 13:25     ASSESSMENT / PLAN:  PULMONARY OETT>>9/14>>9/19 A: Acute hypoxic resp failure due to pulm edema Tobacco abuse HCAP (evolving infiltrate RLL) AE COPD? > Emphysema on CT scan, no prior diagnosis of COPD COPD exac likely and edema contribution to bronchospasm  P:   BD scheduled and PRN Drop Solumedrol  40 q24h   CARDIOVASCULAR CVL>>9/14>> A:  NSTEMI>+ trop I 6.5 9/14 incidental finding. Septic shock- resolved 3VD , stent 15 yrs ago R/o endocarditis-neg per TEE  P:  Cont lasix , lisinopril added Morphine  IV heparin -can dc or change to lovenox Control HR important, dc cardizem gtt -can use low dose BB TCTS consult - defer to cards?  RENAL A:   Hypo K, hypomag hypo phos- improved pulm edema P:   Lasix  40 q 8h To neg balance  GASTROINTESTINAL A:   feeds P:   pepcid Swallow eval  HEMATOLOGIC A:   Chronic anemia L inguinal hematoma  P:  transfuse per protocol for hgb <7.0 Monitor left groin closely   INFECTIOUS A:  Bacteremia, MSSA, related to recent angio? Vascular in nature unclear Septic shock resolved R/o HCAP TEE neg  P:   BCx2  9/14>>MSSA UC  9/14>>negative WC 9/13>>pseudomonas Sputum Abx: Vanc  , start date 9/14>>>9/17 Abx: cefepime, start date 9/14>>> Ancef 9/15>>>9/15 BC x2 9/15>>neg  [place] of vascular graft RLE at some point for source of MSSA -defer to vascular  ENDOCRINE A:   DM last Hbg a1c  7.2 NICE goal 140-180 P:   Ct lantus 10 then mod ssi  Dropping steroids should help  NEUROLOGIC A:  Chronic pain  P:   fent prn  - oxycodone restart when able to take PO   TODAY'S SUMMARY:  Extubated, unclear source of MSSA, is she a candidate for CABG if continues to improve Can transfer to SDU soon  I have personally obtained a history, examined the patient, evaluated laboratory and imaging  results, formulated the assessment and plan and placed orders. CRITICAL CARE: The patient is critically ill with multiple organ systems failure and requires high complexity decision making for assessment and support, frequent evaluation and titration of therapies, application of advanced monitoring technologies and extensive interpretation of multiple databases. Critical Care Time devoted to patient care services described in this note is 31 minutes.    Cyril Mourning MD. Tonny Bollman. [facility] Pager [phone] If no response call [phone]

## 2014-03-13 ENCOUNTER — Inpatient Hospital Stay (HOSPITAL_COMMUNITY): Payer: BC Managed Care – PPO

## 2014-03-13 ENCOUNTER — Encounter: Payer: BC Managed Care – PPO | Admitting: Surgery

## 2014-03-13 DIAGNOSIS — I509 Heart failure, unspecified: Secondary | ICD-10-CM

## 2014-03-13 DIAGNOSIS — I214 Non-ST elevation (NSTEMI) myocardial infarction: Secondary | ICD-10-CM

## 2014-03-13 DIAGNOSIS — I5023 Acute on chronic systolic (congestive) heart failure: Secondary | ICD-10-CM

## 2014-03-13 LAB — CBC
HEMATOCRIT: 29 % — AB (ref 36.0–46.0)
Hemoglobin: 8.7 g/dL — ABNORMAL LOW (ref 12.0–15.0)
MCH: 21.9 pg — AB (ref 26.0–34.0)
MCHC: 30 g/dL (ref 30.0–36.0)
MCV: 73 fL — AB (ref 78.0–100.0)
Platelets: 293 10*3/uL (ref 150–400)
RBC: 3.97 MIL/uL (ref 3.87–5.11)
RDW: 18.5 % — AB (ref 11.5–15.5)
WBC: 7 10*3/uL (ref 4.0–10.5)

## 2014-03-13 LAB — CARBOXYHEMOGLOBIN
CARBOXYHEMOGLOBIN: 1.5 % (ref 0.5–1.5)
Methemoglobin: 0.8 % (ref 0.0–1.5)
O2 SAT: 67.8 %
TOTAL HEMOGLOBIN: 9.1 g/dL — AB (ref 12.0–16.0)

## 2014-03-13 LAB — CULTURE, BLOOD (ROUTINE X 2)
CULTURE: NO GROWTH
Culture: NO GROWTH

## 2014-03-13 LAB — BASIC METABOLIC PANEL
Anion gap: 7 (ref 5–15)
Anion gap: 8 (ref 5–15)
BUN: 31 mg/dL — ABNORMAL HIGH (ref 6–23)
BUN: 34 mg/dL — ABNORMAL HIGH (ref 6–23)
CALCIUM: 8.6 mg/dL (ref 8.4–10.5)
CALCIUM: 8.6 mg/dL (ref 8.4–10.5)
CHLORIDE: 98 meq/L (ref 96–112)
CO2: 43 mEq/L (ref 19–32)
CO2: 43 meq/L — AB (ref 19–32)
CREATININE: 0.46 mg/dL — AB (ref 0.50–1.10)
Chloride: 100 mEq/L (ref 96–112)
Creatinine, Ser: 0.49 mg/dL — ABNORMAL LOW (ref 0.50–1.10)
GFR calc Af Amer: >90 mL/min (ref >90)
GFR calc non Af Amer: >90 mL/min (ref >90)
GLUCOSE: 191 mg/dL — AB (ref 70–99)
Glucose, Bld: 167 mg/dL — ABNORMAL HIGH (ref 70–99)
Potassium: 3.1 mEq/L — ABNORMAL LOW (ref 3.7–5.3)
Potassium: 3.7 mEq/L (ref 3.7–5.3)
Sodium: 148 mEq/L — ABNORMAL HIGH (ref 137–147)
Sodium: 151 mEq/L — ABNORMAL HIGH (ref 137–147)

## 2014-03-13 LAB — POCT I-STAT 3, ART BLOOD GAS (G3+)
ACID-BASE EXCESS: 24 mmol/L — AB (ref 0.0–2.0)
Acid-Base Excess: 24 mmol/L — ABNORMAL HIGH (ref 0.0–2.0)
BICARBONATE: 50.3 meq/L — AB (ref 20.0–24.0)
BICARBONATE: 48.6 meq/L — AB (ref 20.0–24.0)
O2 Saturation: 100 %
O2 Saturation: 96 %
PCO2 ART: 60.9 mmHg — AB (ref 35.0–45.0)
PH ART: 7.523 — AB (ref 7.350–7.450)
PO2 ART: 379 mmHg — AB (ref 80.0–100.0)
Patient temperature: 97.8
pCO2 arterial: 48.7 mmHg — ABNORMAL HIGH (ref 35.0–45.0)
pH, Arterial: 7.606 (ref 7.350–7.450)
pO2, Arterial: 69 mmHg — ABNORMAL LOW (ref 80.0–100.0)

## 2014-03-13 LAB — GLUCOSE, CAPILLARY
GLUCOSE-CAPILLARY: 254 mg/dL — AB (ref 70–99)
GLUCOSE-CAPILLARY: 228 mg/dL — AB (ref 70–99)
GLUCOSE-CAPILLARY: 213 mg/dL — AB (ref 70–99)
Glucose-Capillary: 160 mg/dL — ABNORMAL HIGH (ref 70–99)
Glucose-Capillary: 188 mg/dL — ABNORMAL HIGH (ref 70–99)
Glucose-Capillary: 223 mg/dL — ABNORMAL HIGH (ref 70–99)

## 2014-03-13 LAB — TROPONIN I
Troponin I: 1.39 ng/mL (ref <0.30)
Troponin I: 1.78 ng/mL (ref <0.30)

## 2014-03-13 MED ORDER — POTASSIUM CHLORIDE 10 MEQ/50ML IV SOLN
10.0000 meq | INTRAVENOUS | Status: AC
  Administered 2014-03-13 (×4): 10 meq via INTRAVENOUS
  Filled 2014-03-13 (×4): qty 50

## 2014-03-13 MED ORDER — FENTANYL CITRATE 0.05 MG/ML IJ SOLN
100.0000 ug | Freq: Once | INTRAMUSCULAR | Status: AC
  Administered 2014-03-13: 100 ug via INTRAVENOUS

## 2014-03-13 MED ORDER — POTASSIUM CHLORIDE 10 MEQ/100ML IV SOLN
10.0000 meq | INTRAVENOUS | Status: AC
  Administered 2014-03-13 (×2): 10 meq via INTRAVENOUS
  Filled 2014-03-13 (×2): qty 100

## 2014-03-13 MED ORDER — FENTANYL CITRATE 0.05 MG/ML IJ SOLN
0.0000 ug/h | INTRAMUSCULAR | Status: DC
  Administered 2014-03-13: 75 ug/h via INTRAVENOUS
  Administered 2014-03-14: 100 ug/h via INTRAVENOUS
  Administered 2014-03-15: 200 ug/h via INTRAVENOUS
  Administered 2014-03-15: 250 ug/h via INTRAVENOUS
  Filled 2014-03-13 (×4): qty 50

## 2014-03-13 MED ORDER — FUROSEMIDE 10 MG/ML IJ SOLN
40.0000 mg | Freq: Four times a day (QID) | INTRAMUSCULAR | Status: AC
  Administered 2014-03-13 (×2): 40 mg via INTRAVENOUS

## 2014-03-13 MED ORDER — INSULIN GLARGINE 100 UNIT/ML ~~LOC~~ SOLN
20.0000 [IU] | Freq: Every day | SUBCUTANEOUS | Status: DC
  Filled 2014-03-13: qty 0.2

## 2014-03-13 MED ORDER — POTASSIUM CHLORIDE 20 MEQ/15ML (10%) PO LIQD
40.0000 meq | ORAL | Status: AC
  Administered 2014-03-13 – 2014-03-14 (×2): 40 meq
  Filled 2014-03-13 (×3): qty 30

## 2014-03-13 MED ORDER — FREE WATER
200.0000 mL | Freq: Three times a day (TID) | Status: DC
  Administered 2014-03-13 – 2014-03-14 (×2): 200 mL

## 2014-03-13 MED ORDER — HEPARIN (PORCINE) IN NACL 100-0.45 UNIT/ML-% IJ SOLN
1300.0000 [IU]/h | INTRAMUSCULAR | Status: DC
  Administered 2014-03-13 – 2014-03-14 (×2): 1300 [IU]/h via INTRAVENOUS
  Filled 2014-03-13 (×3): qty 250

## 2014-03-13 MED ORDER — MIDAZOLAM HCL 2 MG/2ML IJ SOLN
INTRAMUSCULAR | Status: AC
  Administered 2014-03-13: 2 mg via INTRAVENOUS
  Filled 2014-03-13: qty 2

## 2014-03-13 MED ORDER — INSULIN GLARGINE 100 UNIT/ML ~~LOC~~ SOLN
10.0000 [IU] | Freq: Once | SUBCUTANEOUS | Status: AC
  Administered 2014-03-13: 10 [IU] via SUBCUTANEOUS
  Filled 2014-03-13 (×2): qty 0.1

## 2014-03-13 MED ORDER — FENTANYL CITRATE 0.05 MG/ML IJ SOLN: 50.0000 ug | Freq: Once | INTRAMUSCULAR | Status: AC

## 2014-03-13 MED ORDER — VITAL HIGH PROTEIN PO LIQD
1000.0000 mL | ORAL | Status: DC
  Administered 2014-03-13 – 2014-03-14 (×2): 1000 mL
  Administered 2014-03-15: 13:00:00
  Filled 2014-03-13 (×5): qty 1000

## 2014-03-13 MED ORDER — MILRINONE IN DEXTROSE 20 MG/100ML IV SOLN
0.2500 ug/kg/min | INTRAVENOUS | Status: DC
  Administered 2014-03-13: 0.125 ug/kg/min via INTRAVENOUS
  Administered 2014-03-14: 0.25 ug/kg/min via INTRAVENOUS
  Filled 2014-03-13 (×2): qty 100

## 2014-03-13 MED ORDER — PRO-STAT SUGAR FREE PO LIQD
30.0000 mL | Freq: Two times a day (BID) | ORAL | Status: DC
  Administered 2014-03-13 – 2014-03-15 (×5): 30 mL
  Filled 2014-03-13 (×6): qty 30

## 2014-03-13 MED ORDER — POTASSIUM CHLORIDE 20 MEQ/15ML (10%) PO LIQD
40.0000 meq | Freq: Once | ORAL | Status: AC
  Administered 2014-03-13: 40 meq
  Filled 2014-03-13: qty 30

## 2014-03-13 MED ORDER — FENTANYL CITRATE 0.05 MG/ML IJ SOLN
INTRAMUSCULAR | Status: AC
  Administered 2014-03-13: 100 ug via INTRAVENOUS
  Filled 2014-03-13: qty 2

## 2014-03-13 MED ORDER — ETOMIDATE 2 MG/ML IV SOLN
20.0000 mg | Freq: Once | INTRAVENOUS | Status: AC
  Administered 2014-03-13: 20 mg via INTRAVENOUS

## 2014-03-13 MED ORDER — PREDNISONE 5 MG/5ML PO SOLN
30.0000 mg | Freq: Every day | ORAL | Status: DC
  Administered 2014-03-14 – 2014-03-15 (×2): 30 mg
  Filled 2014-03-13 (×3): qty 30

## 2014-03-13 MED ORDER — FENTANYL BOLUS VIA INFUSION
50.0000 ug | INTRAVENOUS | Status: DC | PRN
  Administered 2014-03-13: 50 ug via INTRAVENOUS
  Administered 2014-03-14: 100 ug via INTRAVENOUS
  Filled 2014-03-13: qty 100

## 2014-03-13 MED ORDER — MIDAZOLAM HCL 2 MG/2ML IJ SOLN
2.0000 mg | Freq: Once | INTRAMUSCULAR | Status: AC
  Administered 2014-03-13: 2 mg via INTRAVENOUS

## 2014-03-13 MED ORDER — CEFAZOLIN SODIUM-DEXTROSE 2-3 GM-% IV SOLR
2.0000 g | Freq: Three times a day (TID) | INTRAVENOUS | Status: DC
  Administered 2014-03-13 – 2014-03-14 (×3): 2 g via INTRAVENOUS
  Filled 2014-03-13 (×5): qty 50

## 2014-03-13 NOTE — Progress Notes (Addendum)
MEDICATION RELATED CONSULT NOTE - INITIAL   Pharmacy Consult for Milrinone Indication: HF Patient Measurements: Height:  (157.5 cm) Weight: 202 lb 13.2 oz (92 kg) IBW/kg (Calculated) : 50.1 Vital Signs: Temp: 98.3 F (36.8 C) (09/21 1200) Temp src: Oral (09/21 1200) BP: 110/50 mmHg (09/21 1300) Pulse Rate: 67 (09/21 1300) Labs:  Recent Labs  03/11/14 0440 03/11/14 1130 03/12/14 0355 03/13/14 0250  WBC 7.9  --  7.7 7.0  HGB 8.1*  --  8.6* 8.7*  HCT 26.2*  --  28.9* 29.0*  PLT 265  --  293 293  CREATININE  --  0.45* 0.43* 0.46*   Estimated Creatinine Clearance: 82.9 ml/min (by C-G formula based on Cr of 0.46). Assessment: 65 YOF with severe 3 vessel disease not amenable to PCI and severe acute HF (EF 10-15%) to start milrinone. Cardiology requested help with initial dosing. Order set utilized. Low rate per Dr. Elease Hashimoto.   Plan:  Start milrinone per order-set at 0.125 mcg/kg/min then increase up to 0.25 mcg/kg/min after 4 hours. Further rate changes or BP concerns - contact Physician.   Thanks,  Link Snuffer, PharmD, BCPS Clinical Pharmacist [phone] 03/13/2014,2:57 PM

## 2014-03-13 NOTE — Progress Notes (Signed)
CRITICAL VALUE ALERT  Critical value received:  Co2 from BMET   43  Date of notification:  [id number]  Time of notification:  0415  Critical value read back:Yes.    Nurse who received alert:  G. Myer Haff, RN  MD notified (1st page):  Pola Corn  Time of first page:  0425  MD notified (2nd page):  Time of second page:  Responding MD: Pola Corn (Nurse took message)  Time MD responded: [phone]

## 2014-03-13 NOTE — Progress Notes (Signed)
Inpatient Diabetes Program Recommendations  AACE/ADA: New Consensus Statement on Inpatient Glycemic Control (2013)  Target Ranges:  Prepandial:   less than 140 mg/dL      Peak postprandial:   less than 180 mg/dL (1-2 hours)      Critically ill patients:  140 - 180 mg/dL   Reason for Assessment:  Results for AVELINE, DAUS (MRN [id number]) as of 03/13/2014 14:11  Ref. Range 03/12/2014 19:27 03/13/2014 00:12 03/13/2014 04:02 03/13/2014 09:34 03/13/2014 11:12  Glucose-Capillary Latest Range: 70-99 mg/dL 409 (H) 811 (H) 914 (H) 254 (H) 228 (H)   Note Lantus increased to 20 units daily.  If CBG's continue to be greater than 180 mg/dL, consider ICU glycemic control protocol-  IV insulin.    Thanks, Beryl Meager, RN, BC-ADM Inpatient Diabetes Coordinator Pager [phone]

## 2014-03-13 NOTE — Progress Notes (Signed)
eLink Physician-Brief Progress Note Patient Name: [name] DOB: 12/08/1955 MRN: [id number]   Date of Service  03/13/2014  HPI/Events of Note  Severe loop diuretic induced met alk Hypokalemia  eICU Interventions  -Aggressive KCl repletion -If severe met alk persists after K+ fully repleted, consider acetazolamide     Intervention Category Major Interventions: Acid-Base disturbance - evaluation and management;Electrolyte abnormality - evaluation and management  Merton Border 03/13/2014, 6:54 PM

## 2014-03-13 NOTE — Progress Notes (Addendum)
PROGRESS NOTE  Subjective:   [name] is a 58 yo with  1. Severe three-vessel coronary artery disease - not amenable to PCI 2. Severe acute on chronic systolic congestive heart failure with an EF of 10-15% 3. Pseudomonas aeruginosa bacteremia 4. Diabetes mellitus 5. . COPD 6. Peripheral vascular disease -status post right iliac stent, status post fem - tibioperoneal trunk.  7. Hyperlipidemia  She made slight improvement of the weekend but this morning developed recurrent heart failure and required reintubation.  Central line is being placed at present.   Objective:    Vital Signs:   Temp:  [97.8 F (36.6 C)-98.6 F (37 C)] 98.3 F (36.8 C) (09/21 1200) Pulse Rate:  [66-128] 67 (09/21 1300) Resp:  [12-27] 15 (09/21 1300) BP: (110-159)/(45-76) 110/50 mmHg (09/21 1300) SpO2:  [70 %-100 %] 100 % (09/21 1300) FiO2 (%):  [50 %-100 %] 50 % (09/21 1222)  Last BM Date: 03/06/14   24-hour weight change: Weight change:   Weight trends: Filed Weights   03/10/14 1310 03/11/14 0500 03/12/14 0500  Weight: 213 lb 3 oz (96.7 kg) 208 lb 15.9 oz (94.8 kg) 202 lb 13.2 oz (92 kg)    Intake/Output:  09/20 0701 - 09/21 0700 In: 528 [I.V.:228; IV Piggyback:300] Out: 3700 [Urine:3700]     Physical Exam: BP 110/50  Pulse 67  Temp(Src) 98.3 F (36.8 C) (Oral)  Resp 15  Ht  (1.575 m)  Wt 202 lb 13.2 oz (92 kg)  BMI 37.09 kg/m2  SpO2 100%  Wt Readings from Last 3 Encounters:  03/12/14 202 lb 13.2 oz (92 kg)  03/12/14 202 lb 13.2 oz (92 kg)  03/01/14 196 lb (88.905 kg)    General: Vital signs reviewed and noted.  Intubated, on the vent   Head: Normocephalic, atraumatic.  Eyes: conjunctivae/corneas clear.  EOM's intact.   Throat: normal  Neck:  IJ in place   Lungs:    on the vent  Heart:  RR,   Abdomen:  Soft, non-tender, non-distended    Extremities:    Neurologic: sedated  Psych: sedated    Labs: BMET:  Recent Labs  03/12/14 0355 03/13/14 0250    NA 148* 148*  K 3.2* 3.1*  CL 99 98  CO2 39* 43*  GLUCOSE 198* 167*  BUN 29* 31*  CREATININE 0.43* 0.46*  CALCIUM 8.5 8.6    Liver function tests: No results found for this basename: AST, ALT, ALKPHOS, BILITOT, PROT, ALBUMIN,  in the last 72 hours No results found for this basename: LIPASE, AMYLASE,  in the last 72 hours  CBC:  Recent Labs  03/12/14 0355 03/13/14 0250  WBC 7.7 7.0  HGB 8.6* 8.7*  HCT 28.9* 29.0*  MCV 72.6* 73.0*  PLT 293 293    Cardiac Enzymes:  Recent Labs  03/13/14 1142  TROPONINI 1.39*    Coagulation Studies: No results found for this basename: LABPROT, INR,  in the last 72 hours  Other: No components found with this basename: POCBNP,  No results found for this basename: DDIMER,  in the last 72 hours No results found for this basename: HGBA1C,  in the last 72 hours No results found for this basename: CHOL, HDL, LDLCALC, TRIG, CHOLHDL,  in the last 72 hours No results found for this basename: TSH, T4TOTAL, FREET3, T3FREE, THYROIDAB,  in the last 72 hours No results found for this basename: VITAMINB12, FOLATE, FERRITIN, TIBC, IRON, RETICCTPCT,  in the last 72 hours  Other results:  EKG :  NSR at 93.  Old ant. MI  Medications:    Infusions: . sodium chloride 10 mL/hr at 03/12/14 2200  . fentaNYL infusion INTRAVENOUS 75 mcg/hr (03/13/14 0915)    Scheduled Medications: . antiseptic oral rinse  7 mL Mouth Rinse QID  . aspirin  81 mg Oral Daily  . atorvastatin  40 mg Per Tube q1800  .  ceFAZolin (ANCEF) IV  2 g Intravenous Q8H  . chlorhexidine  15 mL Mouth Rinse BID  . darifenacin  7.5 mg Oral Daily  . enalaprilat  0.625 mg Intravenous 4 times per day  . escitalopram  10 mg Per Tube Daily  . famotidine (PEPCID) IV  20 mg Intravenous Q12H  . feeding supplement (PRO-STAT SUGAR FREE 64)  30 mL Per Tube BID  . feeding supplement (VITAL HIGH PROTEIN)  1,000 mL Per Tube Q24H  . furosemide  40 mg Intravenous Q6H  . insulin aspart  0-15  Units Subcutaneous 6 times per day  . insulin glargine  10 Units Subcutaneous Once  . [START ON 03/14/2014] insulin glargine  20 Units Subcutaneous Daily  . ipratropium-albuterol  3 mL Nebulization Q6H  . metoprolol  2.5 mg Intravenous 4 times per day  . potassium chloride  10 mEq Intravenous Q1 Hr x 4  . [START ON 03/14/2014] predniSONE  30 mg Per Tube Q breakfast    Assessment/ Plan:   Principal Problem:   Sepsis Active Problems:   Left leg pain   Anemia   Diabetes mellitus, controlled   NSTEMI (non-ST elevated myocardial infarction)  1. Acute on chronic systolic congestive heart hair. The patient has severe three-vessel disease but also has severe acute on chronic systolic CHF. The degree of CHF seems to be out of proportion relative to her degree of coronary artery disease.  She has  numerous other medical problems and is a very poor candidate at present for bypass grafting. She has a Pseudomonas bacteremia. Has severe PVD.    She has severe peripheral vascular disease. I discussed the case with Dr. [name] and if an intra-aortic balloon pump is needed he would recommend going and from the left femoral artery site.  . She's had several procedures on the right side.  Will start Milrinone - low dose .  Will titrate as needed.      Disposition: Length of Stay: 8  Vesta Mixer, Montez Hageman., MD, [facility] 03/13/2014, 1:49 PM Office [phone] Pager [phone]

## 2014-03-13 NOTE — Progress Notes (Signed)
PULMONARY / CRITICAL CARE MEDICINE   Name: [name] MRN: [id number] DOB: 18-May-1956    ADMISSION DATE:  03/05/2014 CONSULTATION DATE:  9/14  REFERRING MD :  Triad  CHIEF COMPLAINT: Left leg pain  INITIAL PRESENTATION: Left leg pain, hypotension and hypoxic.  STUDIES:  9/14  Echo- EF 30-35% 9/16  TEE- no LAA thrombus, no valvular vegetation, anterior, anteroseptal, apical, and inferioapical hypokinesis, EF 35% 9/17 EKG- sinus tach, low voltage QRS, cannot r/o anteroseptal infarct 9/18 LHC- 40% distal left main, severe diffuse disease from LAD, 95% stenosis circ, focal 75% mid RCA, LVEF 15%  9/21 L groin vascular ultrasound >>> no evidence of complication around graft site  SIGNIFICANT EVENTS: 9/14 hypoxia, hypotension . ++ Trop I 9/14 intubated 9/15 pos BC MSSA  9/16 TEE>>> see above 9/17- neg 2 liters, still bronchospastic 9/21 acute hypoxemic respiratory failure, re-intubated  SUBJECTIVE: was doing well until early this morning, then emergently re-inutbated for acute hypoxemic respiratory failure   VITAL SIGNS: Temp:  [97.8 F (36.6 C)-98.6 F (37 C)] 98.6 F (37 C) (09/21 0403) Pulse Rate:  [66-97] 97 (09/21 0600) Resp:  [12-24] 24 (09/21 0600) BP: (127-144)/(49-76) 127/76 mmHg (09/21 0600) SpO2:  [91 %-98 %] 92 % (09/21 0600) HEMODYNAMICS:   VENTILATOR SETTINGS:   INTAKE / OUTPUT:  Intake/Output Summary (Last 24 hours) at 03/13/14 0840 Last data filed at 03/13/14 0600  Gross per 24 hour  Intake    499 ml  Output   3600 ml  Net  -3101 ml    PHYSICAL EXAMINATION:  Gen: marked respiratory distress, diaphoretic HEENT: NCAT, EOMi PULM: Crackles bilaterally CV: Tachy, regular Ab: BS+, soft, nontender Ext: cool, cap refill intact Derm: R ankle wound with scant purulent drainage, ecchymosis LLQ abdomen Neuro: confused  LABS:  CBC  Recent Labs Lab 03/11/14 0440 03/12/14 0355 03/13/14 0250  WBC 7.9 7.7 7.0  HGB 8.1* 8.6* 8.7*  HCT 26.2*  28.9* 29.0*  PLT 265 293 293   Coag's  Recent Labs Lab 03/06/14 1300  INR 1.45   BMET  Recent Labs Lab 03/11/14 1130 03/12/14 0355 03/13/14 0250  NA 148* 148* 148*  K 3.5* 3.2* 3.1*  CL 100 99 98  CO2 37* 39* 43*  BUN 28* 29* 31*  CREATININE 0.45* 0.43* 0.46*  GLUCOSE 317* 198* 167*   Electrolytes  Recent Labs Lab 03/08/14 0501 03/09/14 0410 03/10/14 0425 03/11/14 1130 03/12/14 0355 03/13/14 0250  CALCIUM 7.9* 8.0* 8.4 8.5 8.5 8.6  MG 2.2 1.9 2.0  --   --   --   PHOS 1.7* 2.6 2.6  --   --   --    Sepsis Markers  Recent Labs Lab 03/06/14 1400 03/06/14 1938 03/07/14 0500 03/08/14 0501  LATICACIDVEN 1.1  --   --   --   PROCALCITON  --  1.59 1.34 0.79   ABG  Recent Labs Lab 03/07/14 0936 03/08/14 0500 03/09/14 1031  PHART 7.313* 7.346* 7.304*  PCO2ART 40.3 40.2 54.6*  PO2ART 76.0* 114.0* 124.0*   Liver Enzymes  Recent Labs Lab 03/08/14 0501  AST 41*  ALT 50*  ALKPHOS 72  BILITOT 0.3  ALBUMIN 2.2*   Cardiac Enzymes  Recent Labs Lab 03/06/14 1015 03/06/14 1718  TROPONINI 10.57* 16.99*  PROBNP 8842.0*  --    Glucose  Recent Labs Lab 03/12/14 0723 03/12/14 1140 03/12/14 1540 03/12/14 1927 03/13/14 0012 03/13/14 0402  GLUCAP 215* 239* 233* 193* 223* 160*    Imaging Dg Chest Port 1  View  03/12/2014   CLINICAL DATA:  Follow-up pulmonary edema and effusions.  EXAM: PORTABLE CHEST - 1 VIEW  COMPARISON:  Portable chest x-rays yesterday dating back to 03/07/2014.  FINDINGS: Cardiac silhouette mildly enlarged but stable. Interval improvement in the interstitial and airspace pulmonary edema, though mild edema persists. Stable bilateral pleural effusions, right greater than left and dense consolidation in the lower lobes. No new pulmonary parenchymal abnormalities. Left jugular central venous catheter tip projects over the upper SVC, unchanged.  IMPRESSION: Support apparatus satisfactory. Improved CHF, with mild interstitial pulmonary  edema persisting. Stable bilateral pleural effusions, right greater than left and associated dense passive atelectasis in the lower lobes. No new abnormalities.   Electronically Signed   By: Evangeline Dakin M.D.   On: 03/12/2014 08:15     ASSESSMENT / PLAN:  PULMONARY OETT>>9/14>>9/19 ext; reintubated 9/21 A: Acute hypoxic resp failure due to pulm edema again 9/21, no clear other source of resp failure Tobacco abuse Known COPD, little evidence for exacerbation of COPD ABG with met alkalosis P:   Intubated, mechanical ventilation Keep set rate on vent 14, allow her to set rate, TVol 8cc, watch for air trapping BD scheduled and PRN Wean off steroids for COPD over next week  CARDIOVASCULAR CVL>>9/14>>9/20 CVL #2 >> 9/21 A:  Severe three vessel CAD Acute systolic CHF exacerbation despite -3L Septic shock> resolved NSTEMI P:  Lasix bid today ACE, B-blocker, ASA per cardiology Lovenox for NSTEMI > change back to heparin after CVL in place in case procedures such as CABG needed TCTS consult? CVP monitoring, Coox when CVL   RENAL A:   Metabolic alkalosis due to hypokalemia P:   Replete potassium aggressively Check BMET/mg/phos in AM Monitor UOP  GASTROINTESTINAL A:   No acute issues P:   pepcid for stress ulcer prophylaxis Restart tube feedings OG tube  HEMATOLOGIC A:   Chronic anemia L inguinal hematoma without evidence of bleeding P:  Transfuse per protocol for hgb <7.0 Monitor left groin closely  INFECTIOUS A:   Bacteremia after recent angiogram, source uncertain, TEE negative, cultures now clear Septic shock resolved TEE neg BCx2  9/14>>MSSA UC  9/14>>negative WC 9/13>>pseudomonas BC x2 9/15>>neg P:   Abx: Vanc  , start date 9/14>>>9/17>>9/21 Abx: cefepime, start date 9/14>>>9/21 Ancef 9/15>>>9/15; restarted 9/21 >> plan 3 week course  Appreciate ID  ENDOCRINE A:   DM last Hbg a1c  7.2 P:   Increase lantus  To 20 Continue SSI Wean  steroids  NEUROLOGIC A:  Chronic pain  Sedation needs for vent P:   RASS goal -1 Fentanyl gtt   TODAY'S SUMMARY:  Reintubated today for acute hypoxemic respiratory failure due to pulmonary edema, no evidence of COPD exacerbation or HCAP. Worry that ongoing ischemia is contributing to failure.  May need CABG.  I have personally obtained a history, examined the patient, evaluated laboratory and imaging results, formulated the assessment and plan and placed orders. CRITICAL CARE: The patient is critically ill with multiple organ systems failure and requires high complexity decision making for assessment and support, frequent evaluation and titration of therapies, application of advanced monitoring technologies and extensive interpretation of multiple databases. Critical Care Time devoted to patient care services described in this note is 60 minutes.    Roselie Awkward, MD [facility] Pager: [phone] Cell: [phone] If no response, call [phone]

## 2014-03-13 NOTE — Progress Notes (Signed)
[facility] ADULT ICU REPLACEMENT PROTOCOL FOR AM LAB REPLACEMENT ONLY  The patient does apply for the [facility] Adult ICU Electrolyte Replacment Protocol based on the criteria listed below:   1. Is GFR >/= 40 ml/min? Yes.    Patient's GFR today is >90. 2. Is urine output >/= 0.5 ml/kg/hr for the last 6 hours? Yes.   Patient's UOP is 1.63 ml/kg/hr 3. Is BUN < 60 mg/dL? Yes.    Patient's BUN today is 31 4. Abnormal electrolyte(s): Potassium (3.1). 5. Ordered repletion with: per protocol  6. If a panic level lab has been reported, has the CCM MD in charge been notified? Yes.   CO2 of 43 on BMP. Physician:  Dr. Mliss Sax, Sanda Dejoy P 03/13/2014 4:33 AM

## 2014-03-13 NOTE — Progress Notes (Signed)
SLP Cancellation Note  Patient Details Name: [name] MRN: [id number] DOB: 1955/08/24   Cancelled treatment:       Reason Eval/Treat Not Completed: Medical issues which prohibited therapy (Intubated. Signing off. Please re-consult when appropriate. )  Ferdinand Lango MA, CCC-SLP [phone]  [name] 03/13/2014, 8:56 AM

## 2014-03-13 NOTE — Progress Notes (Signed)
INFECTIOUS DISEASE PROGRESS NOTE  ID: [name] is a 59 y.o. female with  Principal Problem:   Sepsis Active Problems:   Left leg pain   Anemia   Diabetes mellitus, controlled   NSTEMI (non-ST elevated myocardial infarction)  Subjective: Awake and alert  Abtx:  Anti-infectives   Start     Dose/Rate Route Frequency Ordered Stop   03/07/14 1200  ceFEPIme (MAXIPIME) 2 g in dextrose 5 % 50 mL IVPB     2 g 100 mL/hr over 30 Minutes Intravenous Every 12 hours 03/07/14 1108     03/07/14 0930  ceFAZolin (ANCEF) IVPB 2 g/50 mL premix  Status:  Discontinued     2 g 100 mL/hr over 30 Minutes Intravenous 3 times per day 03/07/14 0853 03/07/14 1108   03/06/14 0400  ceFEPIme (MAXIPIME) 1 g in dextrose 5 % 50 mL IVPB  Status:  Discontinued     1 g 100 mL/hr over 30 Minutes Intravenous Every 8 hours 03/05/14 1832 03/07/14 0849   03/06/14 0400  vancomycin (VANCOCIN) IVPB 1000 mg/200 mL premix  Status:  Discontinued     1,000 mg 200 mL/hr over 60 Minutes Intravenous Every 8 hours 03/05/14 1839 03/09/14 1054   03/05/14 1845  ceFEPIme (MAXIPIME) 1 g in dextrose 5 % 50 mL IVPB     1 g 100 mL/hr over 30 Minutes Intravenous  Once 03/05/14 1832 03/05/14 2014   03/05/14 1845  vancomycin (VANCOCIN) 500 mg in sodium chloride 0.9 % 100 mL IVPB  Status:  Discontinued     500 mg 100 mL/hr over 60 Minutes Intravenous  Once 03/05/14 1839 03/07/14 0854   03/05/14 1830  vancomycin (VANCOCIN) IVPB 1000 mg/200 mL premix     1,000 mg 200 mL/hr over 60 Minutes Intravenous  Once 03/05/14 1820 03/05/14 2121      Medications:  Scheduled: . antiseptic oral rinse  7 mL Mouth Rinse QID  . aspirin  81 mg Oral Daily  . atorvastatin  40 mg Per Tube q1800  . ceFEPime (MAXIPIME) IV  2 g Intravenous Q12H  . chlorhexidine  15 mL Mouth Rinse BID  . darifenacin  7.5 mg Oral Daily  . enalaprilat  0.625 mg Intravenous 4 times per day  . enoxaparin (LOVENOX) injection  95 mg Subcutaneous Q12H  . escitalopram  10  mg Per Tube Daily  . famotidine (PEPCID) IV  20 mg Intravenous Q12H  . feeding supplement (VITAL HIGH PROTEIN)  1,000 mL Per Tube Q24H  . fentaNYL  50 mcg Intravenous Once  . furosemide  40 mg Intravenous Q6H  . insulin aspart  0-15 Units Subcutaneous 6 times per day  . insulin glargine  10 Units Subcutaneous Daily  . ipratropium-albuterol  3 mL Nebulization Q6H  . methylPREDNISolone (SOLU-MEDROL) injection  40 mg Intravenous Q24H  . metoprolol  2.5 mg Intravenous 4 times per day  . potassium chloride  40 mEq Per Tube Once    Objective: Vital signs in last 24 hours: Temp:  [97.8 F (36.6 C)-98.6 F (37 C)] 98.6 F (37 C) (09/21 0403) Pulse Rate:  [66-101] 101 (09/21 0839) Resp:  [12-24] 22 (09/21 0839) BP: (127-144)/(45-76) 144/45 mmHg (09/21 0839) SpO2:  [92 %-100 %] 100 % (09/21 0839) FiO2 (%):  [100 %] 100 % (09/21 0839)   General appearance: alert and on vent Resp: rhonchi bilaterally Cardio: regular rate and rhythm GI: normal findings: bowel sounds normal and soft, non-tender Extremities: LE ulcers, scabs unchanged. her RLE is  slightly cooler than her LLE.   Lab Results  Recent Labs  03/12/14 0355 03/13/14 0250  WBC 7.7 7.0  HGB 8.6* 8.7*  HCT 28.9* 29.0*  NA 148* 148*  K 3.2* 3.1*  CL 99 98  CO2 39* 43*  BUN 29* 31*  CREATININE 0.43* 0.46*   Liver Panel No results found for this basename: PROT, ALBUMIN, AST, ALT, ALKPHOS, BILITOT, BILIDIR, IBILI,  in the last 72 hours Sedimentation Rate No results found for this basename: ESRSEDRATE,  in the last 72 hours C-Reactive Protein No results found for this basename: CRP,  in the last 72 hours  Microbiology: Recent Results (from the past 240 hour(s))  URINE CULTURE     Status: None   Collection Time    03/05/14  5:05 PM      Result Value Ref Range Status   Specimen Description URINE, RANDOM   Final   Special Requests NONE   Final   Culture  Setup Time     Final   Value: 03/05/2014 18:00     Performed at  [facility] Count     Final   Value: NO GROWTH     Performed at [facility]   Culture     Final   Value: NO GROWTH     Performed at [facility]   Report Status 03/07/2014 FINAL   Final  CULTURE, BLOOD (ROUTINE X 2)     Status: None   Collection Time    03/05/14  5:12 PM      Result Value Ref Range Status   Specimen Description BLOOD RIGHT ARM   Final   Special Requests BOTTLES DRAWN AEROBIC AND ANAEROBIC 7CC EA   Final   Culture  Setup Time     Final   Value: 03/06/2014 00:49     Performed at [facility]   Culture     Final   Value: STAPHYLOCOCCUS AUREUS     Note: RIFAMPIN AND GENTAMICIN SHOULD NOT BE USED AS SINGLE DRUGS FOR TREATMENT OF STAPH INFECTIONS.     Note: Gram Stain Report Called to,Read Back By and Verified With: THERESA@1 :30PM ON 03/06/14 BY DANTS     Performed at [facility]   Report Status 03/08/2014 FINAL   Final   Organism ID, Bacteria STAPHYLOCOCCUS AUREUS   Final  CULTURE, BLOOD (ROUTINE X 2)     Status: None   Collection Time    03/05/14  6:05 PM      Result Value Ref Range Status   Specimen Description BLOOD RIGHT FOREARM   Final   Special Requests BOTTLES DRAWN AEROBIC AND ANAEROBIC 10CC EA   Final   Culture  Setup Time     Final   Value: 03/06/2014 00:49     Performed at [facility]   Culture     Final   Value: STAPHYLOCOCCUS AUREUS     Note: SUSCEPTIBILITIES PERFORMED ON PREVIOUS CULTURE WITHIN THE LAST 5 DAYS.     Note: Gram Stain Report Called to,Read Back By and Verified With: THERESA@1 :30PM ON 03/06/14 BY DANTS     Performed at [facility]   Report Status 03/08/2014 FINAL   Final  MRSA PCR SCREENING     Status: None   Collection Time    03/05/14 10:28 PM      Result Value Ref Range Status   MRSA by PCR NEGATIVE  NEGATIVE Final   Comment:  The GeneXpert MRSA Assay (FDA     approved for NASAL specimens     only), is one component of a     comprehensive MRSA  colonization     surveillance program. It is not     intended to diagnose MRSA     infection nor to guide or     monitor treatment for     MRSA infections.  WOUND CULTURE     Status: None   Collection Time    03/05/14 11:52 PM      Result Value Ref Range Status   Specimen Description WOUND RIGHT GROIN   Final   Special Requests NONE   Final   Gram Stain     Final   Value: NO WBC SEEN     RARE SQUAMOUS EPITHELIAL CELLS PRESENT     MODERATE GRAM NEGATIVE RODS     Performed at [facility]   Culture     Final   Value: MODERATE PSEUDOMONAS AERUGINOSA     Performed at [facility]   Report Status 03/08/2014 FINAL   Final   Organism ID, Bacteria PSEUDOMONAS AERUGINOSA   Final  URINE CULTURE     Status: None   Collection Time    03/06/14  9:15 AM      Result Value Ref Range Status   Specimen Description URINE, CATHETERIZED   Final   Special Requests NONE   Final   Culture  Setup Time     Final   Value: 03/06/2014 13:06     Performed at [facility] Count     Final   Value: NO GROWTH     Performed at [facility]   Culture     Final   Value: NO GROWTH     Performed at [facility]   Report Status 03/07/2014 FINAL   Final  CULTURE, BLOOD (ROUTINE X 2)     Status: None   Collection Time    03/07/14  9:50 AM      Result Value Ref Range Status   Specimen Description BLOOD RIGHT HAND   Final   Special Requests BOTTLES DRAWN AEROBIC AND ANAEROBIC 10CC   Final   Culture  Setup Time     Final   Value: 03/07/2014 16:05     Performed at [facility]   Culture     Final   Value:        BLOOD CULTURE RECEIVED NO GROWTH TO DATE CULTURE WILL BE HELD FOR 5 DAYS BEFORE ISSUING A FINAL NEGATIVE REPORT     Performed at [facility]   Report Status PENDING   Incomplete  CULTURE, BLOOD (ROUTINE X 2)     Status: None   Collection Time    03/07/14 12:10 PM      Result Value Ref Range Status   Specimen Description BLOOD LEFT  NECK   Final   Special Requests BOTTLES DRAWN AEROBIC AND ANAEROBIC 10CC IJ CVC   Final   Culture  Setup Time     Final   Value: 03/07/2014 18:53     Performed at [facility]   Culture     Final   Value:        BLOOD CULTURE RECEIVED NO GROWTH TO DATE CULTURE WILL BE HELD FOR 5 DAYS BEFORE ISSUING A FINAL NEGATIVE REPORT     Performed at [facility]   Report Status PENDING   Incomplete  Studies/Results: [place] Extrem Low Right Ltd  03/13/2014   CLINICAL DATA:  Evaluation of bypass graft.  EXAM: ULTRASOUND right LOWER EXTREMITY LIMITED  TECHNIQUE: Ultrasound examination of the lower extremity soft tissues was performed in the area of clinical concern.  COMPARISON:  None.  FINDINGS: No focal fluid collections are identified around the graft in the right lower extremity. Patent arterial blood flow.  IMPRESSION: Negative ultrasound examination for complicating features associated with the right lower extremity bypass graft.   Electronically Signed   By: Loralie Champagne M.D.   On: 03/13/2014 09:29   Dg Chest Port 1 View  03/13/2014   CLINICAL DATA:  Intubation.  EXAM: PORTABLE CHEST - 1 VIEW  COMPARISON:  03/12/2014.  FINDINGS: Endotracheal tube noted 3 cm above the carina. NGT noted below the hemidiaphragm. Cardiomegaly with pulmonary vascular prominence and interstitial prominence consistent with congestive heart failure and interstitial edema. Bilateral pneumonitis cannot be excluded. No acute bony abnormality.  IMPRESSION: 1. Endotracheal tube noted 3 cm above the carina. NG tube in good anatomic position. 2. Congestive heart failure with pulmonary interstitial edema. Pulmonary interstitial pneumonitis cannot be excluded .   Electronically Signed   By: Maisie Fus  Register   On: 03/13/2014 09:03   Dg Chest Port 1 View  03/12/2014   CLINICAL DATA:  Follow-up pulmonary edema and effusions.  EXAM: PORTABLE CHEST - 1 VIEW  COMPARISON:  Portable chest x-rays yesterday dating back to  03/07/2014.  FINDINGS: Cardiac silhouette mildly enlarged but stable. Interval improvement in the interstitial and airspace pulmonary edema, though mild edema persists. Stable bilateral pleural effusions, right greater than left and dense consolidation in the lower lobes. No new pulmonary parenchymal abnormalities. Left jugular central venous catheter tip projects over the upper SVC, unchanged.  IMPRESSION: Support apparatus satisfactory. Improved CHF, with mild interstitial pulmonary edema persisting. Stable bilateral pleural effusions, right greater than left and associated dense passive atelectasis in the lower lobes. No new abnormalities.   Electronically Signed   By: Hulan Saas M.D.   On: 03/12/2014 08:15   Dg Chest Port 1 View  03/11/2014   CLINICAL DATA:  Pneumonitis.  EXAM: PORTABLE CHEST - 1 VIEW  COMPARISON:  03/10/2014  FINDINGS: Endotracheal and enteric tubes have been removed. Left jugular central venous catheter tip overlies the upper SVC. The cardiac silhouette remains enlarged. Pulmonary vascular congestion persists with similar appearance of bilateral airspace opacities and likely small bilateral pleural effusions. No pneumothorax.  IMPRESSION: No significant interval change in the appearance of the lungs, most suggestive of pulmonary edema and small bilateral pleural effusions.   Electronically Signed   By: Sebastian Ache   On: 03/11/2014 13:25     Assessment/Plan: Staph bacteremia- MSSA   TEE (-)   Repeat BCx 9-15 pending  PVD  R heel ulcer- Cx pseudomonas  NSTEMI  DM2  Tobacco use  Failed extubation, reintubation 9-21  Total days of antibiotics: 5  9/13 vanco 9/17  9/13 cefepime  9/15 cefazolin 9/15   Repeat BCx are ngtd Would change cefepime back to ancef U/s does not show definite source.  Would stop anbx for Staph bacteremia at 3 weeks total (03-25-14) Available if questions         Johny Sax Infectious Diseases (pager)  [phone] [email or web address] 03/13/2014, 9:33 AM  LOS: 8 days

## 2014-03-13 NOTE — Progress Notes (Signed)
Physical Therapy Discharge Patient Details Name: [name] MRN: [id number] DOB: 08/06/1955 Today's Date: 03/13/2014 Time:  -     Patient discharged from PT services secondary to medical decline - will need to re-order PT to resume therapy services.  Please see latest therapy progress note for current level of functioning and progress toward goals.    Progress and discharge plan discussed with patient and/or caregiver: Patient unable to participate in discharge planning and no caregivers available  GP   [facility] PT [phone]      [facility] 03/13/2014, 10:59 AM

## 2014-03-13 NOTE — Procedures (Signed)
Central Venous Catheter Insertion Procedure Note [name] [id number] 1956/01/02  Procedure: Insertion of Central Venous Catheter Indications: Assessment of intravascular volume, Drug and/or fluid administration and Frequent blood sampling  Procedure Details Consent: Risks of procedure as well as the alternatives and risks of each were explained to the (patient/caregiver).  Consent for procedure obtained.  Time Out: Verified patient identification, verified procedure, site/side was marked, verified correct patient position, special equipment/implants available, medications/allergies/relevent history reviewed, required imaging and test results available.  Performed  Maximum sterile technique was used including antiseptics, cap, gloves, gown, hand hygiene, mask and sheet. Skin prep: Chlorhexidine; local anesthetic administered A antimicrobial bonded/coated triple lumen catheter was placed in the right internal jugular vein to 16 cm using the Seldinger technique.  Sutured x 3.   Evaluation Blood flow good Complications: No apparent complications Patient did tolerate procedure well. Chest X-ray ordered to verify placement.  CXR: pending.  Procedure performed under direct supervision of Dr. Kendrick Fries and with ultrasound guidance for real time vessel cannulation.     Arsenio Loader, NP-C [facility] Pgr: [phone] or [phone]    03/13/2014, 1:54 PM  Attending:  I supervised the procedure  Heber Cicero, MD [facility] Pager: [phone] Cell: [phone] If no response, call [phone]

## 2014-03-13 NOTE — Progress Notes (Signed)
NUTRITION FOLLOW UP  Intervention:    Utilize 71M PEPuP Protocol: initiate TF via OGT with Vital High Protein at 25 ml/h and Prostat 30 ml BID on day 1; on day 2, increase to goal rate of 45 ml/h (1080 ml per day) to provide 1280 kcals (25 kcals/kg ideal weight), 125 gm protein, 903 ml free water daily.  Nutrition Dx:   Inadequate oral intake related to inability to eat as evidenced by NPO status, ongoing.  Goal:   Enteral nutrition to provide 60-70% of estimated calorie needs (22-25 kcals/kg ideal body weight) and 100% of estimated protein needs, based on ASPEN guidelines for permissive underfeeding in critically ill obese individuals, unmet.  Monitor:   TF tolerance/adequacy, weight trend, labs, vent status.  Assessment:   58 yo smoker, PVD, CAD, recent (9-10) failed left femoral attempted stenosis angioplasty who presented with left leg pain 9/13 and found to be hypotensive, hypoxic and + troponin. She has a rt fem open wound that has been healng x 6 weeks and is followed by [facility]. Ct abd reveals large left femoral hematoma.  Patient with 45 lb weight loss over the 6 months PTA due to decreased appetite and poor oral intake with GI distress. Patient was intubated on 9/14 and extubated 9/19. Required re-intubation on 9/21. Patient was receiving Vital High Protein tube feeds while intubated, off since extubation on 9/19. Received MD Consult for TF initiation and management.  Patient is currently intubated on ventilator support MV: 9.6 L/min Temp (24hrs), Avg:98.2 F (36.8 C), Min:97.8 F (36.6 C), Max:98.6 F (37 C)   Height: Ht Readings from Last 1 Encounters:  03/05/14  (1.575 m)    Weight Status:   Wt Readings from Last 1 Encounters:  03/12/14 202 lb 13.2 oz (92 kg)  03/07/14  220 lb 14.4 oz (100.2 kg)   Re-estimated needs:  Kcal: 1673 Protein: 100-125 gm Fluid: 1.9 L  Skin: diabetic ulcers to toes, feet, ankles; open groin wound  Diet Order: NPO   Intake/Output  Summary (Last 24 hours) at 03/13/14 0935 Last data filed at 03/13/14 0600  Gross per 24 hour  Intake    470 ml  Output   3600 ml  Net  -3130 ml    Last BM: 9/14   Labs:   Recent Labs Lab 03/08/14 0501 03/09/14 0410 03/10/14 0425 03/11/14 1130 03/12/14 0355 03/13/14 0250  NA 134* 140 143 148* 148* 148*  K 3.9 3.4* 3.2* 3.5* 3.2* 3.1*  CL 100 103 98 100 99 98  CO2 21 26 34* 37* 39* 43*  BUN 28* 29* 31*  CREATININE 0.41* 0.44* 0.43* 0.45* 0.43* 0.46*  CALCIUM 7.9* 8.0* 8.4 8.5 8.5 8.6  MG 2.2 1.9 2.0  --   --   --   PHOS 1.7* 2.6 2.6  --   --   --   GLUCOSE 220* 153* 297* 317* 198* 167*    CBG (last 3)   Recent Labs  03/12/14 1927 03/13/14 0012 03/13/14 0402  GLUCAP 193* 223* 160*    Scheduled Meds: . antiseptic oral rinse  7 mL Mouth Rinse QID  . aspirin  81 mg Oral Daily  . atorvastatin  40 mg Per Tube q1800  . ceFEPime (MAXIPIME) IV  2 g Intravenous Q12H  . chlorhexidine  15 mL Mouth Rinse BID  . darifenacin  7.5 mg Oral Daily  . enalaprilat  0.625 mg Intravenous 4 times per day  . enoxaparin (LOVENOX) injection  95  mg Subcutaneous Q12H  . escitalopram  10 mg Per Tube Daily  . famotidine (PEPCID) IV  20 mg Intravenous Q12H  . feeding supplement (VITAL HIGH PROTEIN)  1,000 mL Per Tube Q24H  . fentaNYL  50 mcg Intravenous Once  . furosemide  40 mg Intravenous Q6H  . insulin aspart  0-15 Units Subcutaneous 6 times per day  . insulin glargine  10 Units Subcutaneous Daily  . ipratropium-albuterol  3 mL Nebulization Q6H  . methylPREDNISolone (SOLU-MEDROL) injection  40 mg Intravenous Q24H  . metoprolol  2.5 mg Intravenous 4 times per day  . potassium chloride  40 mEq Per Tube Once    Continuous Infusions: . sodium chloride 10 mL/hr at 03/12/14 2200  . fentaNYL infusion INTRAVENOUS      Joaquin Courts, RD, LDN, CNSC Pager [phone] After Hours Pager [phone]

## 2014-03-13 NOTE — Progress Notes (Signed)
RN called RT to patient room.  Patient had increased work of breathing, diaphoretic, increased respiratory rate.  Was on non-rebreather mask.  Placed on BiPAP but did not help.  CCM called and patient was intubated without any complications.  Will obtain ABG.  RT will continue to monitor.

## 2014-03-13 NOTE — Progress Notes (Signed)
ANTICOAGULATION CONSULT NOTE - Initial Consult  Pharmacy Consult for Heparin (switch from full dose Lovenox due to possible procedures) Indication: chest pain/ACS  Allergies  Allergen Reactions  . Penicillins Hives    Tolerates cefepime  . Adhesive [Tape] Rash    Patient Measurements: Height:  (157.5 cm) Weight: 202 lb 13.2 oz (92 kg) IBW/kg (Calculated) : 50.1 Heparin Dosing Weight: 72 kg  Vital Signs: Temp: 98.3 F (36.8 C) (09/21 1200) Temp src: Oral (09/21 1200) BP: 110/50 mmHg (09/21 1300) Pulse Rate: 67 (09/21 1300)  Labs:  Recent Labs  03/11/14 0440 03/11/14 1130 03/11/14 1755 03/12/14 0355 03/13/14 0250 03/13/14 1142  HGB 8.1*  --   --  8.6* 8.7*  --   HCT 26.2*  --   --  28.9* 29.0*  --   PLT 265  --   --  293 293  --   HEPARINUNFRC  --  0.44 0.82* 0.87*  --   --   CREATININE  --  0.45*  --  0.43* 0.46*  --   TROPONINI  --   --   --   --   --  1.39*    Estimated Creatinine Clearance: 82.9 ml/min (by C-G formula based on Cr of 0.46).   Medical History: Past Medical History  Diagnosis Date  . Diabetes mellitus   . Peripheral vascular disease   . Hyperlipidemia   . Obesity   . Hypertension   . Arthritis   . Ulcer of ankle     right  . Coronary artery disease   . COPD (chronic obstructive pulmonary disease)   . NSTEMI (non-ST elevated myocardial infarction)    Assessment: 57 YOF on full dose Lovenox for ACS to now transition to IV heparin for possible procedures. Last dose of Lovenox was at 10:54 AM. Due to recent dose, will need to wait until at least 75% of dosing interval is completed before starting IV heparin. CrCl ~ 83 mL/min. Patient had supra-therapeutic levels early in admission on rates >=1600 units/hr.   Goal of Therapy:  Heparin level 0.3-0.7 units/ml Monitor platelets by anticoagulation protocol: Yes   Plan:  1. Discontinue Lovenox. 2. Delay heparin start due to recent Lovenox dose.  3. Start heparin at 2300 PM without a  bolus at a rate of 1300 units/hr.  4. Heparin level in 8 hours after start of rate.  5. Daily heparin level and CBC while on therapy  Link Snuffer, PharmD, BCPS Clinical Pharmacist [phone] 03/13/2014,2:19 PM

## 2014-03-13 NOTE — Progress Notes (Signed)
PT Cancellation Note  Patient Details Name: [name] MRN: [id number] DOB: [birth year]   Cancelled Treatment:    Reason Eval/Treat Not Completed: Patient not medically ready (Re-intubated.)   [place] [facility] 03/13/2014, 10:58 AM

## 2014-03-13 NOTE — Progress Notes (Signed)
Patient currently and pulmonary respiratory distress and will likely be intubated in the immediate future.  Please contact me if there are any concerns regarding her vascular status.  [name]

## 2014-03-13 NOTE — Procedures (Signed)
Intubation Procedure Note [name] [id number] 1955-08-16  Procedure: Intubation Indications: Respiratory insufficiency  Procedure Details Consent: Unable to obtain consent because of emergent medical necessity. Time Out: Verified patient identification, verified procedure, site/side was marked, verified correct patient position, special equipment/implants available, medications/allergies/relevent history reviewed, required imaging and test results available.  Performed  Drugs Etomidate , versed , fentanyl DL x 1 with MAC 4 blade Grade 1 view 7.5 tube passed through cords under direct visualization Placement confirmed with bilateral breath sounds, positive EtCO2 change and smoke in tube   Evaluation Hemodynamic Status: BP stable throughout; O2 sats: stable throughout Patient's Current Condition: stable Complications: No apparent complications Patient did tolerate procedure well. Chest X-ray ordered to verify placement.  CXR: pending.   Max Fickle 03/13/2014

## 2014-03-14 ENCOUNTER — Inpatient Hospital Stay (HOSPITAL_COMMUNITY): Payer: BC Managed Care – PPO

## 2014-03-14 DIAGNOSIS — J9600 Acute respiratory failure, unspecified whether with hypoxia or hypercapnia: Secondary | ICD-10-CM | POA: Diagnosis present

## 2014-03-14 DIAGNOSIS — I5021 Acute systolic (congestive) heart failure: Secondary | ICD-10-CM | POA: Diagnosis present

## 2014-03-14 DIAGNOSIS — R509 Fever, unspecified: Secondary | ICD-10-CM

## 2014-03-14 LAB — GLUCOSE, CAPILLARY
GLUCOSE-CAPILLARY: 210 mg/dL — AB (ref 70–99)
GLUCOSE-CAPILLARY: 174 mg/dL — AB (ref 70–99)
Glucose-Capillary: 232 mg/dL — ABNORMAL HIGH (ref 70–99)
Glucose-Capillary: 237 mg/dL — ABNORMAL HIGH (ref 70–99)
Glucose-Capillary: 243 mg/dL — ABNORMAL HIGH (ref 70–99)
Glucose-Capillary: 299 mg/dL — ABNORMAL HIGH (ref 70–99)

## 2014-03-14 LAB — BASIC METABOLIC PANEL
ANION GAP: 8 (ref 5–15)
BUN: 31 mg/dL — ABNORMAL HIGH (ref 6–23)
CHLORIDE: 105 meq/L (ref 96–112)
CO2: 38 meq/L — AB (ref 19–32)
Calcium: 8.8 mg/dL (ref 8.4–10.5)
Creatinine, Ser: 0.46 mg/dL — ABNORMAL LOW (ref 0.50–1.10)
GFR calc Af Amer: >90 mL/min (ref >90)
GFR calc non Af Amer: >90 mL/min (ref >90)
Glucose, Bld: 230 mg/dL — ABNORMAL HIGH (ref 70–99)
POTASSIUM: 3.8 meq/L (ref 3.7–5.3)
SODIUM: 151 meq/L — AB (ref 137–147)

## 2014-03-14 LAB — CBC
HCT: 31.7 % — ABNORMAL LOW (ref 36.0–46.0)
Hemoglobin: 9 g/dL — ABNORMAL LOW (ref 12.0–15.0)
MCH: 21.3 pg — ABNORMAL LOW (ref 26.0–34.0)
MCHC: 28.4 g/dL — AB (ref 30.0–36.0)
MCV: 75.1 fL — AB (ref 78.0–100.0)
Platelets: 327 10*3/uL (ref 150–400)
RBC: 4.22 MIL/uL (ref 3.87–5.11)
RDW: 18.7 % — AB (ref 11.5–15.5)
WBC: 11.1 10*3/uL — ABNORMAL HIGH (ref 4.0–10.5)

## 2014-03-14 LAB — BLOOD GAS, ARTERIAL
ACID-BASE EXCESS: 11.9 mmol/L — AB (ref 0.0–2.0)
Acid-Base Excess: 14.7 mmol/L — ABNORMAL HIGH (ref 0.0–2.0)
BICARBONATE: 39 meq/L — AB (ref 20.0–24.0)
BICARBONATE: 35.8 meq/L — AB (ref 20.0–24.0)
Drawn by: 232811
Drawn by: 295031
FIO2: 0.4 %
FIO2: 0.4 %
MECHVT: 330 mL
O2 SAT: 94.7 %
O2 Saturation: 93.8 %
PATIENT TEMPERATURE: 100.1
PEEP: 5 cmH2O
PEEP: 5 cmH2O
PH ART: 7.504 — AB (ref 7.350–7.450)
PO2 ART: 70.2 mmHg — AB (ref 80.0–100.0)
PO2 ART: 68 mmHg — AB (ref 80.0–100.0)
Patient temperature: 98.6
Pressure control: 13 cmH2O
RATE: 14 resp/min
RATE: 14 resp/min
TCO2: 40.4 mmol/L (ref 0–100)
TCO2: 37.1 mmol/L (ref 0–100)
pCO2 arterial: 50.4 mmHg — ABNORMAL HIGH (ref 35.0–45.0)
pCO2 arterial: 44.5 mmHg (ref 35.0–45.0)
pH, Arterial: 7.516 — ABNORMAL HIGH (ref 7.350–7.450)

## 2014-03-14 LAB — HEPARIN LEVEL (UNFRACTIONATED)
Heparin Unfractionated: 0.78 IU/mL — ABNORMAL HIGH (ref 0.30–0.70)
Heparin Unfractionated: 0.47 IU/mL (ref 0.30–0.70)

## 2014-03-14 LAB — CARBOXYHEMOGLOBIN
CARBOXYHEMOGLOBIN: 2 % — AB (ref 0.5–1.5)
Methemoglobin: 0.3 % (ref 0.0–1.5)
O2 SAT: 69.9 %
TOTAL HEMOGLOBIN: 8.4 g/dL — AB (ref 12.0–16.0)

## 2014-03-14 LAB — PHOSPHORUS: PHOSPHORUS: 2.3 mg/dL (ref 2.3–4.6)

## 2014-03-14 LAB — MAGNESIUM: Magnesium: 2.2 mg/dL (ref 1.5–2.5)

## 2014-03-14 MED ORDER — DEXTROSE 5 % IV SOLN
1.0000 g | Freq: Three times a day (TID) | INTRAVENOUS | Status: DC
  Filled 2014-03-14 (×2): qty 1

## 2014-03-14 MED ORDER — MIDAZOLAM HCL 2 MG/2ML IJ SOLN
1.0000 mg | INTRAMUSCULAR | Status: DC | PRN
  Administered 2014-03-14 – 2014-03-15 (×6): 2 mg via INTRAVENOUS
  Filled 2014-03-14 (×6): qty 2

## 2014-03-14 MED ORDER — DEXTROSE 5 % IV SOLN
1.0000 g | Freq: Three times a day (TID) | INTRAVENOUS | Status: DC
  Administered 2014-03-14 – 2014-03-16 (×5): 1 g via INTRAVENOUS
  Filled 2014-03-14 (×8): qty 1

## 2014-03-14 MED ORDER — VANCOMYCIN HCL IN DEXTROSE 1-5 GM/200ML-% IV SOLN
1000.0000 mg | Freq: Two times a day (BID) | INTRAVENOUS | Status: DC
  Administered 2014-03-14 – 2014-03-15 (×3): 1000 mg via INTRAVENOUS
  Filled 2014-03-14 (×3): qty 200

## 2014-03-14 MED ORDER — FREE WATER
200.0000 mL | Status: DC
  Administered 2014-03-14 – 2014-03-15 (×7): 200 mL

## 2014-03-14 MED ORDER — CARVEDILOL 3.125 MG PO TABS
3.1250 mg | ORAL_TABLET | Freq: Two times a day (BID) | ORAL | Status: DC
  Administered 2014-03-14 – 2014-03-20 (×11): 3.125 mg via ORAL
  Filled 2014-03-14 (×15): qty 1

## 2014-03-14 MED ORDER — POTASSIUM CHLORIDE 20 MEQ/15ML (10%) PO LIQD
40.0000 meq | Freq: Two times a day (BID) | ORAL | Status: DC
  Administered 2014-03-14 – 2014-03-15 (×3): 40 meq
  Filled 2014-03-14 (×6): qty 30

## 2014-03-14 MED ORDER — MIDAZOLAM HCL 2 MG/2ML IJ SOLN
INTRAMUSCULAR | Status: AC
  Filled 2014-03-14: qty 2

## 2014-03-14 MED ORDER — MILRINONE IN DEXTROSE 20 MG/100ML IV SOLN
0.1250 ug/kg/min | INTRAVENOUS | Status: DC
  Administered 2014-03-15 – 2014-03-16 (×2): 0.125 ug/kg/min via INTRAVENOUS
  Filled 2014-03-14 (×2): qty 100

## 2014-03-14 MED ORDER — DIGOXIN 125 MCG PO TABS
0.1250 mg | ORAL_TABLET | Freq: Every day | ORAL | Status: DC
  Administered 2014-03-14 – 2014-03-20 (×7): 0.125 mg via ORAL
  Filled 2014-03-14 (×7): qty 1

## 2014-03-14 MED ORDER — ENALAPRIL MALEATE 5 MG PO TABS
5.0000 mg | ORAL_TABLET | Freq: Two times a day (BID) | ORAL | Status: DC
  Administered 2014-03-14 – 2014-03-15 (×2): 5 mg via ORAL
  Filled 2014-03-14 (×5): qty 1

## 2014-03-14 MED ORDER — INSULIN GLARGINE 100 UNIT/ML ~~LOC~~ SOLN
30.0000 [IU] | Freq: Every day | SUBCUTANEOUS | Status: DC
  Administered 2014-03-14 – 2014-03-15 (×2): 30 [IU] via SUBCUTANEOUS
  Filled 2014-03-14 (×2): qty 0.3

## 2014-03-14 MED ORDER — INSULIN ASPART 100 UNIT/ML ~~LOC~~ SOLN
0.0000 [IU] | SUBCUTANEOUS | Status: DC
  Administered 2014-03-14: 11 [IU] via SUBCUTANEOUS
  Administered 2014-03-14: 7 [IU] via SUBCUTANEOUS
  Administered 2014-03-14: 4 [IU] via SUBCUTANEOUS
  Administered 2014-03-14 – 2014-03-15 (×3): 7 [IU] via SUBCUTANEOUS
  Administered 2014-03-15: 4 [IU] via SUBCUTANEOUS
  Administered 2014-03-15: 7 [IU] via SUBCUTANEOUS
  Administered 2014-03-15: 3 [IU] via SUBCUTANEOUS
  Administered 2014-03-16 (×2): 4 [IU] via SUBCUTANEOUS
  Administered 2014-03-16: 3 [IU] via SUBCUTANEOUS
  Administered 2014-03-16: 4 [IU] via SUBCUTANEOUS
  Administered 2014-03-17 (×2): 7 [IU] via SUBCUTANEOUS
  Administered 2014-03-17: 4 [IU] via SUBCUTANEOUS
  Administered 2014-03-17 (×2): 7 [IU] via SUBCUTANEOUS
  Administered 2014-03-17 – 2014-03-18 (×2): 11 [IU] via SUBCUTANEOUS
  Administered 2014-03-18: 7 [IU] via SUBCUTANEOUS
  Administered 2014-03-18 (×2): 4 [IU] via SUBCUTANEOUS
  Administered 2014-03-18: 11 [IU] via SUBCUTANEOUS
  Administered 2014-03-18: 7 [IU] via SUBCUTANEOUS
  Administered 2014-03-19 (×2): 11 [IU] via SUBCUTANEOUS
  Administered 2014-03-19 (×2): 4 [IU] via SUBCUTANEOUS
  Administered 2014-03-19: 7 [IU] via SUBCUTANEOUS
  Administered 2014-03-19 – 2014-03-20 (×2): 4 [IU] via SUBCUTANEOUS
  Administered 2014-03-20: 11 [IU] via SUBCUTANEOUS
  Administered 2014-03-20: 4 [IU] via SUBCUTANEOUS
  Administered 2014-03-20 (×3): 7 [IU] via SUBCUTANEOUS
  Administered 2014-03-21 (×2): 4 [IU] via SUBCUTANEOUS
  Administered 2014-03-21 (×2): 7 [IU] via SUBCUTANEOUS
  Administered 2014-03-21: 4 [IU] via SUBCUTANEOUS
  Administered 2014-03-21: 3 [IU] via SUBCUTANEOUS
  Administered 2014-03-22: 4 [IU] via SUBCUTANEOUS
  Administered 2014-03-22: 3 [IU] via SUBCUTANEOUS
  Administered 2014-03-22: 4 [IU] via SUBCUTANEOUS
  Administered 2014-03-23: 11 [IU] via SUBCUTANEOUS
  Administered 2014-03-23 (×3): 4 [IU] via SUBCUTANEOUS
  Administered 2014-03-23: 7 [IU] via SUBCUTANEOUS
  Administered 2014-03-23 – 2014-03-24 (×2): 4 [IU] via SUBCUTANEOUS
  Administered 2014-03-24: 7 [IU] via SUBCUTANEOUS

## 2014-03-14 NOTE — Progress Notes (Signed)
PULMONARY / CRITICAL CARE MEDICINE   Name: [name] MRN: [id number] DOB: 01/29/1956    ADMISSION DATE:  03/05/2014 CONSULTATION DATE:  9/14  REFERRING MD :  [facility]  CHIEF COMPLAINT: Left leg pain  INITIAL PRESENTATION: Left leg pain, hypotension and hypoxic.  STUDIES:  9/14  Echo- EF 30-35% 9/16  TEE- no LAA thrombus, no valvular vegetation, anterior, anteroseptal, apical, and inferioapical hypokinesis, EF 35% 9/17 EKG- sinus tach, low voltage QRS, cannot r/o anteroseptal infarct 9/18 LHC- 40% distal left main, severe diffuse disease from LAD, 95% stenosis circ, focal 75% mid RCA, LVEF 15%  9/21 L groin vascular ultrasound >>> no evidence of complication around graft site  SIGNIFICANT EVENTS: 9/14 hypoxia, hypotension . ++ Trop I 9/14 intubated 9/15 pos BC MSSA  9/16 TEE>>> see above 9/17- neg 2 liters, still bronchospastic 9/21 acute hypoxemic respiratory failure, re-intubated, cardiology started milrninone  SUBJECTIVE: This morning she had some desaturation with autopeep,  was biting tube, hypoxemic, wheezing, WBC up, low grade temp, milrinone started yesterday    VITAL SIGNS: Temp:  [98 F (36.7 C)-100.6 F (38.1 C)] 100.6 F (38.1 C) (09/22 0700) Pulse Rate:  [64-116] 110 (09/22 0854) Resp:  [14-23] 20 (09/22 0854) BP: (107-151)/(47-78) 131/58 mmHg (09/22 0854) SpO2:  [96 %-100 %] 96 % (09/22 0856) FiO2 (%):  [40 %-50 %] 40 % (09/22 0856) Weight:  [85.1 kg (187 lb 9.8 oz)] 85.1 kg (187 lb 9.8 oz) (09/22 0300) HEMODYNAMICS: CVP:  [6 mmHg-8 mmHg] 8 mmHg VENTILATOR SETTINGS: Vent Mode:  [-] PCV FiO2 (%):  [40 %-50 %] 40 % Set Rate:  [14 bmp] 14 bmp Vt Set:  [330 mL-480 mL] 330 mL PEEP:  [5 cmH20] 5 cmH20 Plateau Pressure:  [12 cmH20-14 cmH20] 14 cmH20 INTAKE / OUTPUT:  Intake/Output Summary (Last 24 hours) at 03/14/14 0859 Last data filed at 03/14/14 0700  Gross per 24 hour  Intake 2433.47 ml  Output   3760 ml  Net -1326.53 ml    PHYSICAL  EXAMINATION:  Gen: sedated on vent HEENT: NCAT, EOMi PULM: Crackles bilaterally CV: Tachy, regular Ab: BS+, soft, nontender Ext: cool, cap refill intact Derm: R ankle wound dressed, ecchymosis LLQ abdomen Neuro: sedated on vent  LABS:  CBC  Recent Labs Lab 03/12/14 0355 03/13/14 0250 03/14/14 0500  WBC 7.7 7.0 11.1*  HGB 8.6* 8.7* 9.0*  HCT 28.9* 29.0* 31.7*  PLT 293 293 327   Coag's No results found for this basename: APTT, INR,  in the last 168 hours BMET  Recent Labs Lab 03/13/14 0250 03/13/14 1842 03/14/14 0500  NA 148* 151* 151*  K 3.1* 3.7 3.8  CL 98 100 105  CO2 43* 43* 38*  BUN 31* 34* 31*  CREATININE 0.46* 0.49* 0.46*  GLUCOSE 167* 191* 230*   Electrolytes  Recent Labs Lab 03/09/14 0410 03/10/14 0425  03/13/14 0250 03/13/14 1842 03/14/14 0500  CALCIUM 8.0* 8.4  < > 8.6 8.6 8.8  MG 1.9 2.0  --   --   --  2.2  PHOS 2.6 2.6  --   --   --  2.3  < > = values in this interval not displayed. Sepsis Markers  Recent Labs Lab 03/08/14 0501  PROCALCITON 0.79   ABG  Recent Labs Lab 03/13/14 1024 03/13/14 1633 03/14/14 0410  PHART 7.523* 7.606* 7.504*  PCO2ART 60.9* 48.7* 50.4*  PO2ART 379.0* 69.0* 70.2*   Liver Enzymes  Recent Labs Lab 03/08/14 0501  AST 41*  ALT 50*  ALKPHOS 72  BILITOT 0.3  ALBUMIN 2.2*   Cardiac Enzymes  Recent Labs Lab 03/13/14 1142 03/13/14 1842  TROPONINI 1.39* 1.78*   Glucose  Recent Labs Lab 03/13/14 1112 03/13/14 1606 03/13/14 1944 03/14/14 0013 03/14/14 0402 03/14/14 0759  GLUCAP 228* 213* 188* 237* 243* 210*    Imaging [place] Extrem Low Right Ltd  03/13/2014   CLINICAL DATA:  Evaluation of bypass graft.  EXAM: ULTRASOUND right LOWER EXTREMITY LIMITED  TECHNIQUE: Ultrasound examination of the lower extremity soft tissues was performed in the area of clinical concern.  COMPARISON:  None.  FINDINGS: No focal fluid collections are identified around the graft in the right lower extremity. Patent  arterial blood flow.  IMPRESSION: Negative ultrasound examination for complicating features associated with the right lower extremity bypass graft.   Electronically Signed   By: Kalman Jewels M.D.   On: 03/13/2014 09:29   Dg Chest Port 1 View  03/13/2014   CLINICAL DATA:  Central line insertion.  Pulmonary edema.  EXAM: PORTABLE CHEST - 1 VIEW 2:21 p.m.  COMPARISON:  03/13/2014 at 8:45 a.m. and 03/12/2014  FINDINGS: Right jugular vein catheter is been inserted and the tip is in the superior vena cava at the level of the carina in good position. Endotracheal tube the is 3 cm above the carina. NG tube has been advanced and is now in the stomach.  Heart size and pulmonary vascularity are normal. The interstitial edema pattern has improved with slight residual haziness at the lung bases. There is new increased density at both bases consistent with posteriorly layered effusions.  No acute osseous abnormality.  IMPRESSION: New central line in good position.  NG tube now below the diaphragm.  New bilateral effusions.  Improved interstitial edema.   Electronically Signed   By: Rozetta Nunnery M.D.   On: 03/13/2014 14:36   Dg Chest Port 1 View  03/13/2014   CLINICAL DATA:  Intubation.  EXAM: PORTABLE CHEST - 1 VIEW  COMPARISON:  03/12/2014.  FINDINGS: Endotracheal tube noted 3 cm above the carina. NGT noted below the hemidiaphragm. Cardiomegaly with pulmonary vascular prominence and interstitial prominence consistent with congestive heart failure and interstitial edema. Bilateral pneumonitis cannot be excluded. No acute bony abnormality.  IMPRESSION: 1. Endotracheal tube noted 3 cm above the carina. NG tube in good anatomic position. 2. Congestive heart failure with pulmonary interstitial edema. Pulmonary interstitial pneumonitis cannot be excluded .   Electronically Signed   By: Marcello Moores  Register   On: 03/13/2014 09:03     ASSESSMENT / PLAN:  PULMONARY OETT>>9/14>>9/19 ext; reintubated 9/21 A: Acute hypoxic  resp failure due to pulm edema again 9/21, ?HCAP Tobacco abuse COPD, little evidence for exacerbation of COPD ABG with met alkalosis improving P:   Intubated, mechanical ventilation Change to pressure control for more comfort Bronch today for quantitative cultures BD scheduled and PRN Wean off steroids for COPD over next week  CARDIOVASCULAR CVL>>9/14>>9/20 CVL #2 >> 9/21 >> A:  Severe three vessel CAD Acute systolic CHF exacerbation despite -3L Septic shock> resolved NSTEMI P:  Appreciate heart failure service Continue milrinone ACE, B-blocker, ASA per cardiology Continue heparin ?[place]  RENAL A:   Metabolic alkalosis due to hypokalemia P:   Keep K > 4.0, follow magnesium Check BMET/mg/phos in AM Monitor UOP  GASTROINTESTINAL A:   No acute issues P:   pepcid for stress ulcer prophylaxis continue tube feedings OG tube  HEMATOLOGIC A:   Chronic anemia L inguinal hematoma without evidence of bleeding  P:  Transfuse per protocol for hgb <7.0 Monitor left groin closely  INFECTIOUS A:   MSSA Bacteremia after recent angiogram, source uncertain, TEE negative, cultures now clear Septic shock resolved HCAP 9/22? BCx2  9/14>>MSSA UC  9/14>>negative WC 9/13>>pseudomonas BC x2 9/15>>neg BC x2 9/22>> BAL quant culture 9/22 >> Abx: Vanc  , start date 9/14>>>9/17>>9/21 Abx: cefepime, start date 9/14>>>9/21 Ancef 9/15>>>9/15; restarted 9/21 >> held 9/22 due to fever, HCAP P:   Stop ancef Start vanc/cefepime 9/22 for HCAP F/u cultures  Appreciate ID  ENDOCRINE A:   DM last Hbg a1c  7.2 P:   Increase lantus  To 20 Continue SSI Wean steroids  NEUROLOGIC A:  Chronic pain  Sedation needs for vent P:   RASS goal -2 Fentanyl gtt   TODAY'S SUMMARY:  Reintubated 9/22 for hypoxemic respiratory failure> pulm edema? HCAP? Bronch today, may need swan  Son and sister updated at bedside and by phone 9/22  I have personally obtained a history, examined the  patient, evaluated laboratory and imaging results, formulated the assessment and plan and placed orders. CRITICAL CARE: The patient is critically ill with multiple organ systems failure and requires high complexity decision making for assessment and support, frequent evaluation and titration of therapies, application of advanced monitoring technologies and extensive interpretation of multiple databases. Critical Care Time devoted to patient care services described in this note is 45 minutes.    Roselie Awkward, MD [place] [facility] Pager: [phone] Cell: [phone] If no response, call [phone]

## 2014-03-14 NOTE — Progress Notes (Signed)
Changes made to ventilator settings per MD.  Will obtain ABG.  RT will continue to monitor.

## 2014-03-14 NOTE — Progress Notes (Signed)
Bronchoscopy performed at bedside by Dr. Kendrick Fries.  BAL sample obtained and sent down to lab.  No complications during procedure.  RT will continue to monitor.

## 2014-03-14 NOTE — Progress Notes (Signed)
ANTIBIOTIC CONSULT NOTE - INITIAL  Pharmacy Consult for Cefepime, vancomycin Indication: rule out pneumonia  Allergies  Allergen Reactions  . Penicillins Hives    Tolerates cefepime  . Adhesive [Tape] Rash    Patient Measurements: Height:  (157.5 cm) Weight: 187 lb 9.8 oz (85.1 kg) IBW/kg (Calculated) : 50.1 Vital Signs: Temp: 100.6 F (38.1 C) (09/22 0700) Temp src: Oral (09/22 0700) BP: 131/58 mmHg (09/22 0854) Pulse Rate: 110 (09/22 0854) Intake/Output from previous day: 09/21 0701 - 09/22 0700 In: 2453.5 [I.V.:1253.5; NG/GT:1050; IV Piggyback:150] Out: 3960 [Urine:3960] Intake/Output from this shift:    Labs:  Recent Labs  03/12/14 0355 03/13/14 0250 03/13/14 1842 03/14/14 0500  WBC 7.7 7.0  --  11.1*  HGB 8.6* 8.7*  --  9.0*  PLT 293 293  --  327  CREATININE 0.43* 0.46* 0.49* 0.46*   Estimated Creatinine Clearance: 79.5 ml/min (by C-G formula based on Cr of 0.46). No results found for this basename: VANCOTROUGH, Leodis Binet, VANCORANDOM, GENTTROUGH, GENTPEAK, GENTRANDOM, TOBRATROUGH, TOBRAPEAK, TOBRARND, AMIKACINPEAK, AMIKACINTROU, AMIKACIN,  in the last 72 hours    Microbiology: 9/15 blood cx2: ngtd 9/13 blood cx2: MSSA 9/13 urine: ngtd 9/13 wound cx: pseudomonas - pan sensitive  9/22 BAL >>  Assessment: 57 YOF who was narrowed to Ancef on 9/21 for MSSA bacteremia now with worsening CXR, crackles, trouble with sats, leukocytosis, and low grade temperature of 100.6. Patient had bronch today in which culture was obtained and re-broadened antibiotics to vancomycin and cefepime. SCr 0.46 - has been trending up.   Goal of Therapy:  Vancomycin trough level 15-20 mcg/ml  Plan:  1. Vancomycin 1g IV q12h- if continues to have good urine output and SCr will consider increase to q8h as patient was therapeutic at this dose earlier in the admission.  2. Cefepime 1g IV q8h. 3. Monitor renal function and adjust dosing as needed.   Link Snuffer, PharmD,  BCPS Clinical Pharmacist [phone] 03/14/2014,1:02 PM

## 2014-03-14 NOTE — Progress Notes (Signed)
PROGRESS NOTE  Subjective:     Remains intubated on 40% FiO2. Started on milrinone - now at 0.25. Co-ox yesterday 68% (after milrinone). CVP 7-8 this am. CXR with worsening infiltrates. Serum NA up to 151.     Objective:    Vital Signs:   Temp:  [98 F (36.7 C)-100.6 F (38.1 C)] 100.6 F (38.1 C) (09/22 0700) Pulse Rate:  [64-116] 110 (09/22 0854) Resp:  [14-23] 20 (09/22 0854) BP: (107-151)/(47-78) 131/58 mmHg (09/22 0854) SpO2:  [96 %-100 %] 96 % (09/22 0856) FiO2 (%):  [40 %-50 %] 40 % (09/22 0856) Weight:  [85.1 kg (187 lb 9.8 oz)] 85.1 kg (187 lb 9.8 oz) (09/22 0300)  Last BM Date: 03/06/14   24-hour weight change: Weight change:   Weight trends: Filed Weights   03/11/14 0500 03/12/14 0500 03/14/14 0300  Weight: 94.8 kg (208 lb 15.9 oz) 92 kg (202 lb 13.2 oz) 85.1 kg (187 lb 9.8 oz)    Intake/Output:  09/21 0701 - 09/22 0700 In: 2453.5 [I.V.:1253.5; NG/GT:1050; IV Piggyback:150] Out: 3960 [Urine:3960]     Physical Exam: BP 131/58  Pulse 110  Temp(Src) 100.6 F (38.1 C) (Oral)  Resp 20  Ht  (1.575 m)  Wt 85.1 kg (187 lb 9.8 oz)  BMI 34.31 kg/m2  SpO2 96%  Wt Readings from Last 3 Encounters:  03/14/14 85.1 kg (187 lb 9.8 oz)  03/14/14 85.1 kg (187 lb 9.8 oz)  03/01/14 88.905 kg (196 lb)    General: Vital signs reviewed and noted.  Intubated, awake on the vent   Head: Normocephalic, atraumatic.  Eyes: conjunctivae/corneas clear.  EOM's intact.   Throat: normal  Neck:  IJ in place   Lungs:    on the vent  Heart:  RRR, no s3 2/6 TR  Abdomen:  Soft, non-tender, non-distended    Extremities:  Warm no edema.   Neurologic: Moves all 4  Psych: Awake on vent    Labs: BMET:  Recent Labs  03/13/14 1842 03/14/14 0500  NA 151* 151*  K 3.7 3.8  CL 100 105  CO2 43* 38*  GLUCOSE 191* 230*  BUN 34* 31*  CREATININE 0.49* 0.46*  CALCIUM 8.6 8.8  MG  --  2.2  PHOS  --  2.3    Liver function tests: No results found for this  basename: AST, ALT, ALKPHOS, BILITOT, PROT, ALBUMIN,  in the last 72 hours No results found for this basename: LIPASE, AMYLASE,  in the last 72 hours  CBC:  Recent Labs  03/13/14 0250 03/14/14 0500  WBC 7.0 11.1*  HGB 8.7* 9.0*  HCT 29.0* 31.7*  MCV 73.0* 75.1*  PLT 293 327    Cardiac Enzymes:  Recent Labs  03/13/14 1142 03/13/14 1842  TROPONINI 1.39* 1.78*    Coagulation Studies: No results found for this basename: LABPROT, INR,  in the last 72 hours  Other: No components found with this basename: POCBNP,  No results found for this basename: DDIMER,  in the last 72 hours No results found for this basename: HGBA1C,  in the last 72 hours No results found for this basename: CHOL, HDL, LDLCALC, TRIG, CHOLHDL,  in the last 72 hours No results found for this basename: TSH, T4TOTAL, FREET3, T3FREE, THYROIDAB,  in the last 72 hours No results found for this basename: VITAMINB12, FOLATE, FERRITIN, TIBC, IRON, RETICCTPCT,  in the last 72 hours   Other results:  EKG :  NSR at 93.  Old ant. MI  Medications:    Infusions: . sodium chloride 40 mL/hr at 03/13/14 2000  . fentaNYL infusion INTRAVENOUS 100 mcg/hr (03/14/14 0931)  . heparin Stopped (03/14/14 0856)  . milrinone 0.25 mcg/kg/min (03/14/14 0449)    Scheduled Medications: . antiseptic oral rinse  7 mL Mouth Rinse QID  . aspirin  81 mg Oral Daily  . atorvastatin  40 mg Per Tube q1800  . ceFEPime (MAXIPIME) IV  1 g Intravenous Q8H  . chlorhexidine  15 mL Mouth Rinse BID  . enalaprilat  0.625 mg Intravenous 4 times per day  . escitalopram  10 mg Per Tube Daily  . famotidine (PEPCID) IV  20 mg Intravenous Q12H  . feeding supplement (PRO-STAT SUGAR FREE 64)  30 mL Per Tube BID  . feeding supplement (VITAL HIGH PROTEIN)  1,000 mL Per Tube Q24H  . free water  200 mL Per Tube Q4H  . insulin aspart  0-20 Units Subcutaneous 6 times per day  . insulin glargine  30 Units Subcutaneous Daily  . ipratropium-albuterol  3 mL  Nebulization Q6H  . metoprolol  2.5 mg Intravenous 4 times per day  . midazolam      . potassium chloride  40 mEq Per Tube BID  . predniSONE  30 mg Per Tube Q breakfast  . vancomycin  1,000 mg Intravenous Q12H    Assessment/ Plan:    1. NSTEMI with severe three-vessel coronary artery disease - not amenable to PCI 2. Acute on chronic systolic congestive heart failure with an EF of 10-15% 3. Acute respiratory failure 4. Fever/leukocytosis    --MSSA bacteremia last week 5. Diabetes mellitus 6. COPD 7. Peripheral vascular disease -status post right iliac stent, status post fem - tibioperoneal trunk. L foot ulcerations 8. Hypernatremia 9. Iron-def anemia  I agree that yesterday's events sound ischemic in nature however despite her CXR I do not see convincing evidence of acute HF today - co-ox is good on milrinone, CVP is 7 and serum sodium is climbing. At this point would hold lasix, decrease milrinone to 0.125, start dig and proceed with free H2O boluses. Will aslo decrease b-blocker slighty. If oxygenation getting worse can then consider placing PA catheter. Agree with bronch and broad-spectrum abx to further evaluate for developing PNA. D/w Dr. Kendrick Fries.   Will eventually need to address coronary if/when she recovers. Continue ASA and heparin.   The patient is critically ill with multiple organ systems failure and requires high complexity decision making for assessment and support, frequent evaluation and titration of therapies, application of advanced monitoring technologies and extensive interpretation of multiple databases.   Critical Care Time devoted to patient care services described in this note is 35 Minutes.   Shadaya Marschner,MD 10:18 AM

## 2014-03-14 NOTE — Progress Notes (Signed)
ANTICOAGULATION CONSULT NOTE  Pharmacy Consult for Heparin  Indication: chest pain/ACS  Allergies  Allergen Reactions  . Penicillins Hives    Tolerates cefepime  . Adhesive [Tape] Rash    Patient Measurements: Height:  (157.5 cm) Weight: 187 lb 9.8 oz (85.1 kg) IBW/kg (Calculated) : 50.1 Heparin Dosing Weight: 72 kg  Vital Signs: BP: 109/46 mmHg (09/22 2000) Pulse Rate: 75 (09/22 2000)  Labs:  Recent Labs  03/12/14 0355 03/13/14 0250 03/13/14 1142 03/13/14 1842 03/14/14 0500 03/14/14 0600 03/14/14 1900  HGB 8.6* 8.7*  --   --  9.0*  --   --   HCT 28.9* 29.0*  --   --  31.7*  --   --   PLT 293 293  --   --  327  --   --   HEPARINUNFRC 0.87*  --   --   --   --  0.78* 0.47  CREATININE 0.43* 0.46*  --  0.49* 0.46*  --   --   TROPONINI  --   --  1.39* 1.78*  --   --   --     Estimated Creatinine Clearance: 79.5 ml/min (by C-G formula based on Cr of 0.46).  Assessment: 58 yo female with multivessel CAD - possible CABG currently on heparin drip 1300 uts/hr HL 0.47 at goal.    Goal of Therapy:  Heparin level 0.3-0.7 units/ml Monitor platelets by anticoagulation protocol: Yes   Plan:  Continue heparin drip 1300 uts/hr  Daily CBC, HL  Leota Sauers Pharm.D. CPP, BCPS Clinical Pharmacist [phone] 03/14/2014 8:23 PM

## 2014-03-14 NOTE — Progress Notes (Signed)
ANTICOAGULATION CONSULT NOTE  Pharmacy Consult for Heparin  Indication: chest pain/ACS  Allergies  Allergen Reactions  . Penicillins Hives    Tolerates cefepime  . Adhesive [Tape] Rash    Patient Measurements: Height:  (157.5 cm) Weight: 187 lb 9.8 oz (85.1 kg) IBW/kg (Calculated) : 50.1 Heparin Dosing Weight: 72 kg  Vital Signs: Temp: 100.1 F (37.8 C) (09/22 0400) Temp src: Oral (09/22 0400) BP: 121/52 mmHg (09/22 0700) Pulse Rate: 90 (09/22 0700)  Labs:  Recent Labs  03/11/14 1755  03/12/14 0355 03/13/14 0250 03/13/14 1142 03/13/14 1842 03/14/14 0500 03/14/14 0600  HGB  --   < > 8.6* 8.7*  --   --  9.0*  --   HCT  --   --  28.9* 29.0*  --   --  31.7*  --   PLT  --   --  293 293  --   --  327  --   HEPARINUNFRC 0.82*  --  0.87*  --   --   --   --  0.78*  CREATININE  --   --  0.43* 0.46*  --  0.49*  --   --   TROPONINI  --   --   --   --  1.39* 1.78*  --   --   < > = values in this interval not displayed.  Estimated Creatinine Clearance: 79.5 ml/min (by C-G formula based on Cr of 0.49).  Assessment: 58 yo female with multivessel CAD for heparin.  Received Lovenox 1 mg/gk SQ x 3 doses  Goal of Therapy:  Heparin level 0.3-0.7 units/ml Monitor platelets by anticoagulation protocol: Yes   Plan:  Continue Heparin at current rate for now.  Expect heparin level to decrease as accumulated Lovenox may still be elevating heparin level.   Check heparin level in 8 hours.   Geannie Risen, PharmD, BCPS  03/14/2014,7:11 AM

## 2014-03-14 NOTE — Progress Notes (Signed)
Inpatient Diabetes Program Recommendations  AACE/ADA: New Consensus Statement on Inpatient Glycemic Control (2013)  Target Ranges:  Prepandial:   less than 140 mg/dL      Peak postprandial:   less than 180 mg/dL (1-2 hours)      Critically ill patients:  140 - 180 mg/dL   Results for [name], [name] (MRN [id number]) as of 03/14/2014 08:30  Ref. Range 03/13/2014 00:12 03/13/2014 04:02 03/13/2014 09:34 03/13/2014 11:12 03/13/2014 16:06 03/13/2014 19:44 03/14/2014 00:13 03/14/2014 04:02  Glucose-Capillary Latest Range: 70-99 mg/dL 119 (H) 147 (H) 829 (H) 228 (H) 213 (H) 188 (H) 237 (H) 243 (H)   Diabetes history: DM2  Outpatient Diabetes medications: Amaryl 2 mg BID, Metformin 1000 mg BID  Current orders for Inpatient glycemic control: Novolog 0-15 units Q4H, Lantus 20 units daily   Inpatient Diabetes Program Recommendations Insulin - Basal: If ICU Glycemic Control order set is not ordered, please consider increasing Lantus to 23 units daily. Correction (SSI): Recommend discontinuing current Novolog correction and use ICU Glycemic Control order set to improve inpatient glycemic control. IInsulin - Meal Coverage: If ICU Glycemic Control order set is not ordered and steroids and tube feeding are continued, please consider ordering Novolog 3 units Q4H for tube feeding coverage (in addition to Novolog correction scale).  Thanks, Orlando Penner, RN, MSN, CCRN Diabetes Coordinator Inpatient Diabetes Program [phone] (Team Pager) [phone] ([facility] office) [phone] [facility] office)

## 2014-03-14 NOTE — Procedures (Signed)
PCCM Bronchoscopy Procedure Note  The patient was informed of the risks (including but not limited to bleeding, infection, respiratory failure, lung injury, tooth/oral injury) and benefits of the procedure and gave consent, see chart.  Indication: needs BAL for pneumonia  Location: 2M11  Condition pre procedure: critically ill on vent  Medications for procedure: versed , fentanyl  Procedure description: The bronchoscope was introduced through the endotracheal tube and passed to the bilateral lungs to the level of the subsegmental bronchi throughout the tracheobronchial tree.  Airway exam revealed some mild acute inflammation bilaterally with scant thick secretions in the right lung from the RLL.  A BAL was performed in the RML.  Clear return with some mucus in BAL.  Procedures performed: BAL RML  Specimens sent: BAL quantitative culture  Condition post procedure: critically ill on vent  EBL: none  Complications: none  Kaitlin [place], MD [place] [facility] Pager: [phone] Cell: [phone] If no response, call [phone]

## 2014-03-15 ENCOUNTER — Ambulatory Visit (INDEPENDENT_AMBULATORY_CARE_PROVIDER_SITE_OTHER): Payer: BC Managed Care – PPO | Admitting: Internal Medicine

## 2014-03-15 ENCOUNTER — Inpatient Hospital Stay (HOSPITAL_COMMUNITY): Payer: BC Managed Care – PPO

## 2014-03-15 DIAGNOSIS — J90 Pleural effusion, not elsewhere classified: Secondary | ICD-10-CM

## 2014-03-15 LAB — CBC WITH DIFFERENTIAL/PLATELET
BASOS ABS: 0 10*3/uL (ref 0.0–0.1)
Basophils Absolute: 0 10*3/uL (ref 0.0–0.1)
Basophils Relative: 0 % (ref 0–1)
Basophils Relative: 0 % (ref 0–1)
Eosinophils Absolute: 0.1 10*3/uL (ref 0.0–0.7)
Eosinophils Absolute: 0 10*3/uL (ref 0.0–0.7)
Eosinophils Relative: 0 % (ref 0–5)
Eosinophils Relative: 0 % (ref 0–5)
HCT: 28.4 % — ABNORMAL LOW (ref 36.0–46.0)
HEMATOCRIT: 27.2 % — AB (ref 36.0–46.0)
Hemoglobin: 7.9 g/dL — ABNORMAL LOW (ref 12.0–15.0)
Hemoglobin: 8.1 g/dL — ABNORMAL LOW (ref 12.0–15.0)
Lymphocytes Relative: 13 % (ref 12–46)
Lymphocytes Relative: 7 % — ABNORMAL LOW (ref 12–46)
Lymphs Abs: 1.6 10*3/uL (ref 0.7–4.0)
Lymphs Abs: 0.9 10*3/uL (ref 0.7–4.0)
MCH: 22.1 pg — ABNORMAL LOW (ref 26.0–34.0)
MCH: 21.7 pg — ABNORMAL LOW (ref 26.0–34.0)
MCHC: 29 g/dL — ABNORMAL LOW (ref 30.0–36.0)
MCHC: 28.5 g/dL — ABNORMAL LOW (ref 30.0–36.0)
MCV: 76 fL — ABNORMAL LOW (ref 78.0–100.0)
MCV: 75.9 fL — ABNORMAL LOW (ref 78.0–100.0)
MONO ABS: 0.8 10*3/uL (ref 0.1–1.0)
Monocytes Absolute: 0.7 10*3/uL (ref 0.1–1.0)
Monocytes Relative: 7 % (ref 3–12)
Monocytes Relative: 5 % (ref 3–12)
Neutro Abs: 9.5 10*3/uL — ABNORMAL HIGH (ref 1.7–7.7)
Neutro Abs: 11.4 10*3/uL — ABNORMAL HIGH (ref 1.7–7.7)
Neutrophils Relative %: 80 % — ABNORMAL HIGH (ref 43–77)
Neutrophils Relative %: 88 % — ABNORMAL HIGH (ref 43–77)
PLATELETS: 229 10*3/uL (ref 150–400)
Platelets: 231 10*3/uL (ref 150–400)
RBC: 3.58 MIL/uL — ABNORMAL LOW (ref 3.87–5.11)
RBC: 3.74 MIL/uL — ABNORMAL LOW (ref 3.87–5.11)
RDW: 18.7 % — ABNORMAL HIGH (ref 11.5–15.5)
RDW: 18.7 % — ABNORMAL HIGH (ref 11.5–15.5)
WBC: 11.9 10*3/uL — ABNORMAL HIGH (ref 4.0–10.5)
WBC: 13.1 10*3/uL — ABNORMAL HIGH (ref 4.0–10.5)

## 2014-03-15 LAB — TROPONIN I: TROPONIN I: 0.58 ng/mL — AB (ref <0.30)

## 2014-03-15 LAB — GLUCOSE, CAPILLARY
GLUCOSE-CAPILLARY: 162 mg/dL — AB (ref 70–99)
GLUCOSE-CAPILLARY: 213 mg/dL — AB (ref 70–99)
GLUCOSE-CAPILLARY: 201 mg/dL — AB (ref 70–99)
GLUCOSE-CAPILLARY: 130 mg/dL — AB (ref 70–99)
Glucose-Capillary: 174 mg/dL — ABNORMAL HIGH (ref 70–99)
Glucose-Capillary: 255 mg/dL — ABNORMAL HIGH (ref 70–99)

## 2014-03-15 LAB — BASIC METABOLIC PANEL
Anion gap: 7 (ref 5–15)
BUN: 25 mg/dL — ABNORMAL HIGH (ref 6–23)
CO2: 34 mEq/L — ABNORMAL HIGH (ref 19–32)
Calcium: 7.8 mg/dL — ABNORMAL LOW (ref 8.4–10.5)
Chloride: 108 mEq/L (ref 96–112)
Creatinine, Ser: 0.42 mg/dL — ABNORMAL LOW (ref 0.50–1.10)
GFR calc Af Amer: >90 mL/min (ref >90)
GLUCOSE: 160 mg/dL — AB (ref 70–99)
POTASSIUM: 3.9 meq/L (ref 3.7–5.3)
Sodium: 149 mEq/L — ABNORMAL HIGH (ref 137–147)

## 2014-03-15 LAB — IRON AND TIBC
Iron: 56 ug/dL (ref 42–135)
Saturation Ratios: 25 % (ref 20–55)
TIBC: 226 ug/dL — AB (ref 250–470)
UIBC: 170 ug/dL (ref 125–400)

## 2014-03-15 LAB — RETICULOCYTES
RBC.: 3.58 MIL/uL — ABNORMAL LOW (ref 3.87–5.11)
Retic Count, Absolute: 53.7 10*3/uL (ref 19.0–186.0)
Retic Ct Pct: 1.5 % (ref 0.4–3.1)

## 2014-03-15 LAB — FERRITIN: Ferritin: 66 ng/mL (ref 10–291)

## 2014-03-15 LAB — BODY FLUID CELL COUNT WITH DIFFERENTIAL
Eos, Fluid: 0 %
Lymphs, Fluid: 59 %
MONOCYTE-MACROPHAGE-SEROUS FLUID: 11 % — AB (ref 50–90)
NEUTROPHIL FLUID: 30 % — AB (ref 0–25)
Total Nucleated Cell Count, Fluid: 68 cu mm (ref 0–1000)

## 2014-03-15 LAB — PROTEIN, BODY FLUID: Total protein, fluid: 1.3 g/dL

## 2014-03-15 LAB — LACTATE DEHYDROGENASE, PLEURAL OR PERITONEAL FLUID: LD, Fluid: 100 U/L — ABNORMAL HIGH (ref 3–23)

## 2014-03-15 LAB — PROTEIN, TOTAL: Total Protein: 5.9 g/dL — ABNORMAL LOW (ref 6.0–8.3)

## 2014-03-15 LAB — FOLATE: FOLATE: 9.3 ng/mL

## 2014-03-15 LAB — VITAMIN B12: VITAMIN B 12: 376 pg/mL (ref 211–911)

## 2014-03-15 LAB — LACTATE DEHYDROGENASE: LDH: 244 U/L (ref 94–250)

## 2014-03-15 LAB — HEPARIN LEVEL (UNFRACTIONATED): Heparin Unfractionated: 0.41 IU/mL (ref 0.30–0.70)

## 2014-03-15 MED ORDER — FENTANYL CITRATE 0.05 MG/ML IJ SOLN
12.5000 ug | INTRAMUSCULAR | Status: DC | PRN
  Administered 2014-03-15 – 2014-03-19 (×4): 12.5 ug via INTRAVENOUS
  Administered 2014-03-20: 25 ug via INTRAVENOUS
  Administered 2014-03-20: 12.5 ug via INTRAVENOUS
  Filled 2014-03-15 (×6): qty 2

## 2014-03-15 MED ORDER — POTASSIUM CL IN DEXTROSE 5% 20 MEQ/L IV SOLN
20.0000 meq | INTRAVENOUS | Status: DC
  Administered 2014-03-15 – 2014-03-17 (×3): 20 meq via INTRAVENOUS
  Filled 2014-03-15 (×9): qty 1000

## 2014-03-15 MED ORDER — VANCOMYCIN HCL IN DEXTROSE 1-5 GM/200ML-% IV SOLN
1000.0000 mg | Freq: Three times a day (TID) | INTRAVENOUS | Status: DC
  Administered 2014-03-15 – 2014-03-16 (×3): 1000 mg via INTRAVENOUS
  Filled 2014-03-15 (×5): qty 200

## 2014-03-15 MED ORDER — ESCITALOPRAM OXALATE 10 MG PO TABS
10.0000 mg | ORAL_TABLET | Freq: Every day | ORAL | Status: DC
  Administered 2014-03-17 – 2014-03-20 (×4): 10 mg via ORAL
  Filled 2014-03-15 (×5): qty 1

## 2014-03-15 MED ORDER — PREDNISONE 10 MG PO TABS
30.0000 mg | ORAL_TABLET | Freq: Every day | ORAL | Status: DC
  Administered 2014-03-17 – 2014-03-18 (×2): 30 mg via ORAL
  Filled 2014-03-15 (×4): qty 1

## 2014-03-15 MED ORDER — ATORVASTATIN CALCIUM 40 MG PO TABS
40.0000 mg | ORAL_TABLET | Freq: Every day | ORAL | Status: DC
  Administered 2014-03-16 – 2014-03-20 (×5): 40 mg via ORAL
  Filled 2014-03-15 (×5): qty 1

## 2014-03-15 MED ORDER — INSULIN GLARGINE 100 UNIT/ML ~~LOC~~ SOLN
15.0000 [IU] | Freq: Every day | SUBCUTANEOUS | Status: DC
  Administered 2014-03-16 – 2014-03-20 (×5): 15 [IU] via SUBCUTANEOUS
  Filled 2014-03-15 (×5): qty 0.15

## 2014-03-15 MED ORDER — HEPARIN (PORCINE) IN NACL 100-0.45 UNIT/ML-% IJ SOLN
1300.0000 [IU]/h | INTRAMUSCULAR | Status: DC
  Administered 2014-03-15 – 2014-03-16 (×3): 1300 [IU]/h via INTRAVENOUS
  Filled 2014-03-15 (×4): qty 250

## 2014-03-15 MED ORDER — NITROGLYCERIN IN D5W 200-5 MCG/ML-% IV SOLN
2.0000 ug/min | INTRAVENOUS | Status: DC
  Administered 2014-03-15: 5 ug/min via INTRAVENOUS
  Filled 2014-03-15: qty 250

## 2014-03-15 NOTE — Progress Notes (Signed)
eLink Physician-Brief Progress Note Patient Name: [name] DOB: Aug 04, 1955 MRN: [id number]   Date of Service  03/15/2014  HPI/Events of Note  Agitated but F/C Tolerates PS 5 cm H2O  eICU Interventions  Discussed with Dr Kendrick Fries Extubate PRN BiPAP Lantus dose halved while NPO Orders refined to reflect post extubation status     Intervention Category Major Interventions: Respiratory failure - evaluation and management  Billy Fischer 03/15/2014, 5:07 PM

## 2014-03-15 NOTE — Procedures (Signed)
Thoracentesis Procedure Note  Pre-operative Diagnosis: respiratory failure, bilateral effusions  Post-operative Diagnosis: same  Indications: respiratory failure  Procedure Details  Consent: Informed consent was obtained. Risks of the procedure were discussed including: infection, bleeding, pain, pneumothorax.  Under sterile conditions the patient was positioned. Betadine solution and sterile drapes were utilized.  1% buffered lidocaine was used to anesthetize the 9th rib space. Fluid was obtained without any difficulties and minimal blood loss.  A dressing was applied to the wound and wound care instructions were provided.   Findings 550 ml of clear pleural fluid was obtained. A sample was sent to Pathology for cytogenetics, flow, and cell counts, as well as for infection analysis.  Complications:  None; patient tolerated the procedure well.          Condition: stable  Plan A follow up chest x-ray was ordered. Bed Rest for 0 hours. Tylenol 650 mg. for pain.  Procedure performed by Rutherford Guys PA-C  Attending Attestation: I was present for the entire procedure.  Heber Hudson, MD [place] [facility] Pager: [phone] Cell: [phone] If no response, call [phone]

## 2014-03-15 NOTE — Progress Notes (Signed)
RT note-placed back to full support due to procedure for chest tube placement.

## 2014-03-15 NOTE — Progress Notes (Signed)
PULMONARY / CRITICAL CARE MEDICINE   Name: [name] MRN: [id number] DOB: 1956-05-11    ADMISSION DATE:  03/05/2014 CONSULTATION DATE:  9/14  REFERRING MD :  [facility]  CHIEF COMPLAINT: Left leg pain  INITIAL PRESENTATION: Left leg pain, hypotension and hypoxic.  STUDIES:  9/14  Echo- EF 30-35% 9/16  TEE- no LAA thrombus, no valvular vegetation, anterior, anteroseptal, apical, and inferioapical hypokinesis, EF 35% 9/17 EKG- sinus tach, low voltage QRS, cannot r/o anteroseptal infarct 9/18 LHC- 40% distal left main, severe diffuse disease from LAD, 95% stenosis circ, focal 75% mid RCA, LVEF 15%  9/21 L groin vascular ultrasound >>> no evidence of complication around graft site  SIGNIFICANT EVENTS: 9/14 hypoxia, hypotension . ++ Trop I 9/14 intubated 9/15 pos BC MSSA  9/16 TEE>>> see above 9/17- neg 2 liters, still bronchospastic 9/21 acute hypoxemic respiratory failure, re-intubated, cardiology started milrninone 9/23 thora left side  SUBJECTIVE: passing SBT this morning   VITAL SIGNS: Temp:  [98.9 F (37.2 C)-99.6 F (37.6 C)] 98.9 F (37.2 C) (09/23 0811) Pulse Rate:  [74-121] 94 (09/23 0700) Resp:  [14-22] 18 (09/23 0700) BP: (94-146)/(32-83) 94/42 mmHg (09/23 0700) SpO2:  [92 %-100 %] 96 % (09/23 0813) FiO2 (%):  [40 %] 40 % (09/23 0813) Weight:  [86.4 kg (190 lb 7.6 oz)] 86.4 kg (190 lb 7.6 oz) (09/23 0310) HEMODYNAMICS: CVP:  [0 mmHg-46 mmHg] 10 mmHg VENTILATOR SETTINGS: Vent Mode:  [-] PCV FiO2 (%):  [40 %] 40 % Set Rate:  [14 bmp] 14 bmp PEEP:  [5 cmH20] 5 cmH20 Plateau Pressure:  [12 cmH20-14 cmH20] 13 cmH20 INTAKE / OUTPUT:  Intake/Output Summary (Last 24 hours) at 03/15/14 0850 Last data filed at 03/15/14 0700  Gross per 24 hour  Intake 3365.3 ml  Output   1765 ml  Net 1600.3 ml    PHYSICAL EXAMINATION:  Gen: sedated on vent HEENT: NCAT, EOMi PULM: Crackles bilaterally CV: Tachy, regular Ab: BS+, soft, nontender Ext: cool, cap refill  intact Derm: R ankle wound dressed, ecchymosis LLQ abdomen Neuro: sedated on vent  LABS:  CBC  Recent Labs Lab 03/13/14 0250 03/14/14 0500 03/15/14 0430  WBC 7.0 11.1* 11.9*  HGB 8.7* 9.0* 7.9*  HCT 29.0* 31.7* 27.2*  PLT 293 327 229   Coag's No results found for this basename: APTT, INR,  in the last 168 hours BMET  Recent Labs Lab 03/13/14 1842 03/14/14 0500 03/15/14 0430  NA 151* 151* 149*  K 3.7 3.8 3.9  CL 100 105 108  CO2 43* 38* 34*  BUN 34* 31* 25*  CREATININE 0.49* 0.46* 0.42*  GLUCOSE 191* 230* 160*   Electrolytes  Recent Labs Lab 03/09/14 0410 03/10/14 0425  03/13/14 1842 03/14/14 0500 03/15/14 0430  CALCIUM 8.0* 8.4  < > 8.6 8.8 7.8*  MG 1.9 2.0  --   --  2.2  --   PHOS 2.6 2.6  --   --  2.3  --   < > = values in this interval not displayed. Sepsis Markers No results found for this basename: LATICACIDVEN, PROCALCITON, O2SATVEN,  in the last 168 hours ABG  Recent Labs Lab 03/13/14 1633 03/14/14 0410 03/14/14 1642  PHART 7.606* 7.504* 7.516*  PCO2ART 48.7* 50.4* 44.5  PO2ART 69.0* 70.2* 68.0*   Liver Enzymes No results found for this basename: AST, ALT, ALKPHOS, BILITOT, ALBUMIN,  in the last 168 hours Cardiac Enzymes  Recent Labs Lab 03/13/14 1142 03/13/14 1842  TROPONINI 1.39* 1.78*   Glucose  Recent Labs Lab 03/14/14 0759 03/14/14 1231 03/14/14 1600 03/14/14 2055 03/15/14 0012 03/15/14 0358  GLUCAP 210* 299* 232* 174* 174* 162*    Imaging Dg Chest Port 1 View  03/14/2014   CLINICAL DATA:  Intubation.  EXAM: PORTABLE CHEST - 1 VIEW  COMPARISON:  02/22/2014.  FINDINGS: Endotracheal tube, NG tube, and right IJ line in stable position. Cardiomegaly with pulmonary alveolar infiltrates bilaterally. Bilateral pleural effusions. These findings are consistent with congestive heart failure. Pulmonary edema has increased from prior exam. No pneumothorax. No acute osseus abnormality. Carotid vascular calcification.  IMPRESSION: 1.  Line and tubes are in good anatomic position. 2. Progressive pulmonary edema with small bilateral pleural effusions. 3. Carotid vascular disease.   Electronically Signed   By: Marcello Moores  Register   On: 03/14/2014 07:35     ASSESSMENT / PLAN:  PULMONARY OETT>>9/14>>9/19 ext; reintubated 9/21 A: Acute hypoxic resp failure due to pulm edema again 9/21, ?HCAP Bilateral effusions> suspect transudate Tobacco abuse COPD, little evidence for exacerbation of COPD ABG with met alkalosis improving P:   Continue full vent support Continue pressure control for comfort Thoracentesis today for effusions BD scheduled and PRN Wean off steroids for COPD over next week  CARDIOVASCULAR CVL>>9/14>>9/20 CVL #2 >> 9/21 >> A:  Severe three vessel CAD Acute systolic CHF exacerbation despite -3L Septic shock> resolved NSTEMI P:  Appreciate heart failure service Continue milrinone ACE, B-blocker, ASA per cardiology Continue heparin  RENAL A:   Metabolic alkalosis due to hypokalemia P:   Keep K > 4.0, follow magnesium Check BMET/mg/phos in AM Monitor UOP  GASTROINTESTINAL A:   No acute issues P:   pepcid for stress ulcer prophylaxis continue tube feedings OG tube  HEMATOLOGIC A:   Chronic anemia L inguinal hematoma without evidence of bleeding > Hgb down 9/23, monitor P:  Transfuse per protocol for hgb <7.0 Monitor left groin closely Repeat CBC 9/23 PM  INFECTIOUS A:   MSSA Bacteremia after recent angiogram, source uncertain, TEE negative, cultures now clear Septic shock resolved HCAP 9/22? BCx2  9/14>>MSSA UC  9/14>>negative WC 9/13>>pseudomonas BC x2 9/15>>neg BC x2 9/22>> BAL quant culture 9/22 >> Abx: Vanc  , start date 9/14>>>9/17>>9/21 Abx: cefepime, start date 9/14>>>9/21 Ancef 9/15>>>9/15; restarted 9/21 >> held 9/22 due to fever, HCAP P:   Continue vanc/cefepime 9/22 for HCAP until bronch cultures back F/u cultures  Appreciate ID  ENDOCRINE A:   DM last Hbg  a1c  7.2 P:   Increase lantus  To 20 Continue SSI Wean steroids  NEUROLOGIC A:  Chronic pain  Sedation needs for vent P:   RASS goal -2 Fentanyl gtt   TODAY'S SUMMARY:  Reintubated 9/22 for hypoxemic respiratory failure> pulm edema? HCAP? Most likely pulm edema; thora today  Sister updated at bedside and by phone 9/23  I have personally obtained a history, examined the patient, evaluated laboratory and imaging results, formulated the assessment and plan and placed orders. CRITICAL CARE: The patient is critically ill with multiple organ systems failure and requires high complexity decision making for assessment and support, frequent evaluation and titration of therapies, application of advanced monitoring technologies and extensive interpretation of multiple databases. Critical Care Time devoted to patient care services described in this note is 45 minutes.    Roselie Awkward, MD [place] Pager: [phone] Cell: [phone] If no response, call [phone]

## 2014-03-15 NOTE — Progress Notes (Signed)
PROGRESS NOTE  Subjective:    Results of bronch yesterday noted. Doesn't appear to be clear infective source. Gram stain benign.   Remains intubated on 40% FiO2. Milrinone cut back yesterday to 0.125. Lasix held. Getting free H20. Sodium 151-> 149. CXR stable with edema/effusions. Complains of mild CP. CVP 7.   Objective:    Vital Signs:   Temp:  [98.9 F (37.2 C)-99.6 F (37.6 C)] 98.9 F (37.2 C) (09/23 0811) Pulse Rate:  [74-121] 94 (09/23 0700) Resp:  [14-22] 18 (09/23 0700) BP: (94-146)/(32-83) 94/42 mmHg (09/23 0700) SpO2:  [92 %-100 %] 96 % (09/23 0813) FiO2 (%):  [40 %] 40 % (09/23 0813) Weight:  [86.4 kg (190 lb 7.6 oz)] 86.4 kg (190 lb 7.6 oz) (09/23 0310)  Last BM Date: 03/06/14   24-hour weight change: Weight change: 1.3 kg (2 lb 13.9 oz)  Weight trends: Filed Weights   03/12/14 0500 03/14/14 0300 03/15/14 0310  Weight: 92 kg (202 lb 13.2 oz) 85.1 kg (187 lb 9.8 oz) 86.4 kg (190 lb 7.6 oz)    Intake/Output:  09/22 0701 - 09/23 0700 In: 3435.2 [I.V.:1315.2; NG/GT:1520; IV Piggyback:600] Out: 1865 [Urine:1865]     Physical Exam: BP 94/42  Pulse 94  Temp(Src) 98.9 F (37.2 C) (Axillary)  Resp 18  Ht  (1.575 m)  Wt 86.4 kg (190 lb 7.6 oz)  BMI 34.83 kg/m2  SpO2 96%  Wt Readings from Last 3 Encounters:  03/15/14 86.4 kg (190 lb 7.6 oz)  03/15/14 86.4 kg (190 lb 7.6 oz)  03/01/14 88.905 kg (196 lb)    General: Vital signs reviewed and noted.  Intubated, awake on the vent   Head: Normocephalic, atraumatic.  Eyes: conjunctivae/corneas clear.  EOM's intact.   Throat: normal  Neck:  IJ in place   Lungs:    on the vent  Heart:  RRR, no s3 2/6 TR  Abdomen:  Soft, non-tender, non-distended    Extremities:  Warm no edema.   Neurologic: Moves all 4  Psych: Awake on vent    Labs: BMET:  Recent Labs  03/14/14 0500 03/15/14 0430  NA 151* 149*  K 3.8 3.9  CL 105 108  CO2 38* 34*  GLUCOSE 230* 160*  BUN 31* 25*  CREATININE  0.46* 0.42*  CALCIUM 8.8 7.8*  MG 2.2  --   PHOS 2.3  --     Liver function tests: No results found for this basename: AST, ALT, ALKPHOS, BILITOT, PROT, ALBUMIN,  in the last 72 hours No results found for this basename: LIPASE, AMYLASE,  in the last 72 hours  CBC:  Recent Labs  03/14/14 0500 03/15/14 0430  WBC 11.1* 11.9*  NEUTROABS  --  9.5*  HGB 9.0* 7.9*  HCT 31.7* 27.2*  MCV 75.1* 76.0*  PLT 327 229    Cardiac Enzymes:  Recent Labs  03/13/14 1142 03/13/14 1842  TROPONINI 1.39* 1.78*    Coagulation Studies: No results found for this basename: LABPROT, INR,  in the last 72 hours  Other: No components found with this basename: POCBNP,  No results found for this basename: DDIMER,  in the last 72 hours No results found for this basename: HGBA1C,  in the last 72 hours No results found for this basename: CHOL, HDL, LDLCALC, TRIG, CHOLHDL,  in the last 72 hours No results found for this basename: TSH, T4TOTAL, FREET3, T3FREE, THYROIDAB,  in the last 72 hours  Recent Labs  03/15/14 0430  RETICCTPCT 1.5     Other results:  EKG :  NSR at 93.  Old ant. MI  Medications:    Infusions: . sodium chloride 40 mL/hr at 03/13/14 2000  . fentaNYL infusion INTRAVENOUS 250 mcg/hr (03/15/14 0200)  . heparin 1,300 Units/hr (03/15/14 0600)  . milrinone 0.125 mcg/kg/min (03/15/14 0700)    Scheduled Medications: . antiseptic oral rinse  7 mL Mouth Rinse QID  . aspirin  81 mg Oral Daily  . atorvastatin  40 mg Per Tube q1800  . carvedilol  3.125 mg Oral BID WC  . ceFEPime (MAXIPIME) IV  1 g Intravenous Q8H  . chlorhexidine  15 mL Mouth Rinse BID  . digoxin  0.125 mg Oral Daily  . enalapril  5 mg Oral BID  . escitalopram  10 mg Per Tube Daily  . famotidine (PEPCID) IV  20 mg Intravenous Q12H  . feeding supplement (PRO-STAT SUGAR FREE 64)  30 mL Per Tube BID  . feeding supplement (VITAL HIGH PROTEIN)  1,000 mL Per Tube Q24H  . free water  200 mL Per Tube Q4H  .  insulin aspart  0-20 Units Subcutaneous 6 times per day  . insulin glargine  30 Units Subcutaneous Daily  . ipratropium-albuterol  3 mL Nebulization Q6H  . potassium chloride  40 mEq Per Tube BID  . predniSONE  30 mg Per Tube Q breakfast  . vancomycin  1,000 mg Intravenous Q12H    Assessment/ Plan:    1. NSTEMI with severe three-vessel coronary artery disease - not amenable to PCI 2. Acute on chronic systolic congestive heart failure with an EF of 10-15% 3. Acute respiratory failure 4. Fever/leukocytosis    --MSSA bacteremia last week 5. Diabetes mellitus 6. COPD 7. Peripheral vascular disease -status post right iliac stent, status post fem - tibioperoneal trunk. L foot ulcerations 8. Hypernatremia 9. Iron-def anemia  As I put the picture together, I think the initial event was ischemic with flash pulmonary and now capillary leak syndrome. Currently CVP and cardiac output are ok with milrinone. However CXR is unchanged.   I am worried she has ongoing myocardial ischemia and this may limit ability to extubate. Agree with thoracentesis to help ease work of breathing and reduce myocardial oxygen demand. Will start IV NTG. Would not start b-blocker at this time due to resp distress. Will continue asa, milrinone, heparin and statin. If she is unable to wean, an IABP may be an option but access will likely be problematic (hopefully not prohibitive) with PAD. Would continue to hold lasix.   Will eventually need CABG, if acceptable targets and candidate.  The patient is critically ill with multiple organ systems failure and requires high complexity decision making for assessment and support, frequent evaluation and titration of therapies, application of advanced monitoring technologies and extensive interpretation of multiple databases.   Critical Care Time devoted to patient care services described in this note is 35 Minutes.   Daniel Bensimhon,MD 9:00 AM

## 2014-03-15 NOTE — Progress Notes (Addendum)
ANTIBIOTIC + ANTICOAGULATION CONSULT NOTE - Follow-up  Pharmacy Consult for vancomycin Indication: rule out pneumonia  Allergies  Allergen Reactions  . Penicillins Hives    Tolerates cefepime  . Adhesive [Tape] Rash    Patient Measurements: Height:  (157.5 cm) Weight: 190 lb 7.6 oz (86.4 kg) IBW/kg (Calculated) : 50.1 Vital Signs: Temp: 98 F (36.7 C) (09/23 1222) Temp src: Oral (09/23 1222) BP: 108/44 mmHg (09/23 1000) Pulse Rate: 97 (09/23 1000) Intake/Output from previous day: 09/22 0701 - 09/23 0700 In: 3468.2 [I.V.:1348.2; NG/GT:1520; IV Piggyback:600] Out: 1865 [Urine:1865] Intake/Output from this shift: Total I/O In: 289.5 [I.V.:109.5; NG/GT:180] Out: 150 [Urine:150]  Labs:  Recent Labs  03/13/14 1842 03/14/14 0500 03/15/14 0430 03/15/14 1000  WBC  --  11.1* 11.9* 13.1*  HGB  --  9.0* 7.9* 8.1*  PLT  --  327 229 231  CREATININE 0.49* 0.46* 0.42*  --    Estimated Creatinine Clearance: 80.1 ml/min (by C-G formula based on Cr of 0.42). No results found for this basename: Rolm Gala, VANCORANDOM, GENTTROUGH, GENTPEAK, GENTRANDOM, TOBRATROUGH, TOBRAPEAK, TOBRARND, AMIKACINPEAK, AMIKACINTROU, AMIKACIN,  in the last 72 hours   Assessment: 35 YOF who was narrowed to Ancef on 9/21 for MSSA bacteremia but then broadened d/t clinical worsening.   9/13 cefepime >> 9/21; 9/22 >> 9/21 Ancef >> 9/21 9/13 vanc >> 9/17; 9/22 >> 9/15 ancef > ordered not given  9/15 blood cx2 - NEG 9/13 blood cx2 - MSSA 9/13 urine - NEG 9/13 wound cx - pseudomonas - pan sensitive  9/22 BAL - NGTD  Pt was on heparin gtt for ACS. Heparin level this AM was therapeutic at 0.41. No bleeding noted. Heparin held for several hours for procedure but to be restarted shortly.   Goal of Therapy:  Vancomycin trough level 15-20 mcg/ml Heparin level 0.3-0.7  Plan:  1. Change vancomycin to 1gm IV Q8H (had been therapeutic on this regimen previously) 2. F/u renal fxn, C&S,  clinical status and trough at SS 3. Resume previous heparin gtt at 1300 units/hr 4. F/u AM heparin level and CBC  Lysle Pearl, PharmD, BCPS Pager # [phone] 03/15/2014 1:52 PM

## 2014-03-15 NOTE — Procedures (Signed)
Extubation Procedure Note  Patient Details:   Name: [name] DOB: [birth year] MRN: [id number]   Airway Documentation:     Evaluation  O2 sats: stable throughout Complications: No apparent complications Patient did tolerate procedure well. Bilateral Breath Sounds: Clear;Diminished Suctioning: Airway Yes 4l/min [place] IS 750 Vocalizes well  Newt Lukes 03/15/2014, 6:25 PM

## 2014-03-15 NOTE — Progress Notes (Signed)
This RN spk with Dr Bard Herbert, pt given sips of water unable to swallow without fits of coughing. Per Dr Bard Herbert, oral meds held for 2200.  Charlesetta Garibaldi, RS, BSN

## 2014-03-16 ENCOUNTER — Inpatient Hospital Stay (HOSPITAL_COMMUNITY): Payer: BC Managed Care – PPO

## 2014-03-16 DIAGNOSIS — I2510 Atherosclerotic heart disease of native coronary artery without angina pectoris: Secondary | ICD-10-CM

## 2014-03-16 LAB — BASIC METABOLIC PANEL
Anion gap: 8 (ref 5–15)
BUN: 18 mg/dL (ref 6–23)
CO2: 30 meq/L (ref 19–32)
CREATININE: 0.36 mg/dL — AB (ref 0.50–1.10)
Calcium: 7.9 mg/dL — ABNORMAL LOW (ref 8.4–10.5)
Chloride: 107 mEq/L (ref 96–112)
GFR calc Af Amer: >90 mL/min (ref >90)
GFR calc non Af Amer: >90 mL/min (ref >90)
Glucose, Bld: 127 mg/dL — ABNORMAL HIGH (ref 70–99)
Potassium: 3.8 mEq/L (ref 3.7–5.3)
Sodium: 145 mEq/L (ref 137–147)

## 2014-03-16 LAB — CARBOXYHEMOGLOBIN
CARBOXYHEMOGLOBIN: 2 % — AB (ref 0.5–1.5)
Methemoglobin: 0.8 % (ref 0.0–1.5)
O2 Saturation: 59.2 %
Total hemoglobin: 7.6 g/dL — ABNORMAL LOW (ref 12.0–16.0)

## 2014-03-16 LAB — CBC
HEMATOCRIT: 26 % — AB (ref 36.0–46.0)
Hemoglobin: 7.7 g/dL — ABNORMAL LOW (ref 12.0–15.0)
MCH: 22.3 pg — ABNORMAL LOW (ref 26.0–34.0)
MCHC: 29.6 g/dL — ABNORMAL LOW (ref 30.0–36.0)
MCV: 75.1 fL — AB (ref 78.0–100.0)
PLATELETS: 198 10*3/uL (ref 150–400)
RBC: 3.46 MIL/uL — ABNORMAL LOW (ref 3.87–5.11)
RDW: 18.4 % — ABNORMAL HIGH (ref 11.5–15.5)
WBC: 8.7 10*3/uL (ref 4.0–10.5)

## 2014-03-16 LAB — ABO/RH: ABO/RH(D): AB POS

## 2014-03-16 LAB — MAGNESIUM: Magnesium: 2 mg/dL (ref 1.5–2.5)

## 2014-03-16 LAB — CULTURE, BAL-QUANTITATIVE

## 2014-03-16 LAB — GLUCOSE, CAPILLARY
GLUCOSE-CAPILLARY: 114 mg/dL — AB (ref 70–99)
GLUCOSE-CAPILLARY: 119 mg/dL — AB (ref 70–99)
GLUCOSE-CAPILLARY: 142 mg/dL — AB (ref 70–99)
GLUCOSE-CAPILLARY: 154 mg/dL — AB (ref 70–99)
GLUCOSE-CAPILLARY: 167 mg/dL — AB (ref 70–99)
Glucose-Capillary: 178 mg/dL — ABNORMAL HIGH (ref 70–99)

## 2014-03-16 LAB — CULTURE, BAL-QUANTITATIVE W GRAM STAIN: Special Requests: NORMAL

## 2014-03-16 LAB — HEPARIN LEVEL (UNFRACTIONATED): Heparin Unfractionated: 0.42 IU/mL (ref 0.30–0.70)

## 2014-03-16 LAB — PH, BODY FLUID: pH, Fluid: 8.5

## 2014-03-16 LAB — PREPARE RBC (CROSSMATCH)

## 2014-03-16 LAB — VANCOMYCIN, TROUGH: Vancomycin Tr: 22.3 ug/mL — ABNORMAL HIGH (ref 10.0–20.0)

## 2014-03-16 LAB — PHOSPHORUS: Phosphorus: 2.9 mg/dL (ref 2.3–4.6)

## 2014-03-16 MED ORDER — PRO-STAT SUGAR FREE PO LIQD
30.0000 mL | Freq: Two times a day (BID) | ORAL | Status: DC
  Filled 2014-03-16: qty 30

## 2014-03-16 MED ORDER — DOXYCYCLINE HYCLATE 100 MG PO TABS
100.0000 mg | ORAL_TABLET | Freq: Two times a day (BID) | ORAL | Status: DC
  Administered 2014-03-16 – 2014-03-17 (×2): 100 mg via ORAL
  Filled 2014-03-16 (×4): qty 1

## 2014-03-16 MED ORDER — LEVOFLOXACIN 500 MG PO TABS
500.0000 mg | ORAL_TABLET | Freq: Every day | ORAL | Status: DC
  Filled 2014-03-16: qty 1

## 2014-03-16 MED ORDER — SODIUM CHLORIDE 0.9 % IV SOLN
Freq: Once | INTRAVENOUS | Status: AC
  Administered 2014-03-16: 18:00:00 via INTRAVENOUS

## 2014-03-16 MED ORDER — POTASSIUM CHLORIDE CRYS ER 20 MEQ PO TBCR
40.0000 meq | EXTENDED_RELEASE_TABLET | Freq: Two times a day (BID) | ORAL | Status: DC
  Administered 2014-03-16: 40 meq via ORAL
  Filled 2014-03-16 (×3): qty 2

## 2014-03-16 MED ORDER — ENALAPRIL MALEATE 10 MG PO TABS: 10.0000 mg | ORAL_TABLET | Freq: Two times a day (BID) | ORAL | Status: DC

## 2014-03-16 MED ORDER — VANCOMYCIN HCL IN DEXTROSE 750-5 MG/150ML-% IV SOLN
750.0000 mg | Freq: Three times a day (TID) | INTRAVENOUS | Status: DC
  Administered 2014-03-17 (×2): 750 mg via INTRAVENOUS
  Filled 2014-03-16 (×5): qty 150

## 2014-03-16 MED ORDER — PRO-STAT SUGAR FREE PO LIQD
30.0000 mL | Freq: Four times a day (QID) | ORAL | Status: DC
  Administered 2014-03-16 – 2014-03-23 (×25): 30 mL
  Filled 2014-03-16 (×30): qty 30

## 2014-03-16 MED ORDER — JEVITY 1.2 CAL PO LIQD
1000.0000 mL | ORAL | Status: DC
  Administered 2014-03-16 – 2014-03-22 (×6): 1000 mL
  Filled 2014-03-16 (×11): qty 1000

## 2014-03-16 MED ORDER — LOSARTAN POTASSIUM 25 MG PO TABS
25.0000 mg | ORAL_TABLET | Freq: Every day | ORAL | Status: DC
  Administered 2014-03-16: 25 mg via ORAL
  Filled 2014-03-16 (×2): qty 1

## 2014-03-16 MED ORDER — POTASSIUM CHLORIDE 20 MEQ/15ML (10%) PO LIQD
40.0000 meq | Freq: Once | ORAL | Status: AC
  Administered 2014-03-16: 40 meq via ORAL
  Filled 2014-03-16: qty 30

## 2014-03-16 MED ORDER — VITAL HIGH PROTEIN PO LIQD
1000.0000 mL | ORAL | Status: DC
  Filled 2014-03-16 (×2): qty 1000

## 2014-03-16 MED ORDER — ISOSORBIDE MONONITRATE ER 30 MG PO TB24
30.0000 mg | ORAL_TABLET | Freq: Two times a day (BID) | ORAL | Status: DC
  Administered 2014-03-16 – 2014-03-19 (×8): 30 mg via ORAL
  Filled 2014-03-16 (×10): qty 1

## 2014-03-16 NOTE — Progress Notes (Signed)
NUTRITION FOLLOW UP  Intervention:    Resume TF via NGT with Jevity 1.2 at 45 ml/h (1080 ml per day) and Prostat 30 ml QID to provide 1696 kcals, 120 gm protein, 875 ml free water daily.  Nutrition Dx:   Inadequate oral intake related to inability to eat as evidenced by NPO status, ongoing.  Goal:   Intake to meet >90% of estimated nutrition needs. Unmet.  Monitor:   TF tolerance/adequacy, weight trend, labs, swallowing function and ability to begin PO diet.  Assessment:   58 yo smoker, PVD, CAD, recent (9-10) failed left femoral attempted stenosis angioplasty who presented with left leg pain 9/13 and found to be hypotensive, hypoxic and + troponin. She has a rt fem open wound that has been healng x 6 weeks and is followed by [facility]. Ct abd reveals large left femoral hematoma.  Patient intubated from 9/14-9/19 and 9/21-9/23. TF off since extubation. S/P swallow evaluation and FEES with SLP today; unable to safely take PO's at this time. Received MD Consult for TF initiation and management.   Height: Ht Readings from Last 1 Encounters:  03/05/14  (1.575 m)    Weight Status:   Wt Readings from Last 1 Encounters:  03/16/14 191 lb 12.8 oz (87 kg)  03/12/14  202 lb 13.2 oz (92 kg)  03/07/14  220 lb 14.4 oz (100.2 kg)   Re-estimated needs:  Kcal: 1600-1800 Protein: 100-120 gm Fluid: 1.8 L  Skin: diabetic ulcers to toes, feet, ankles; open groin wound  Diet Order: NPO   Intake/Output Summary (Last 24 hours) at 03/16/14 1559 Last data filed at 03/16/14 1417  Gross per 24 hour  Intake 2850.19 ml  Output   1150 ml  Net 1700.19 ml    Last BM: 9/14   Labs:   Recent Labs Lab 03/10/14 0425  03/14/14 0500 03/15/14 0430 03/16/14 0500  NA 143  < > 151* 149* 145  K 3.2*  < > 3.8 3.9 3.8  CL 98  < > 105 108 107  CO2 34*  < > 38* 34* 30  BUN 17  < > 31* 25* 18  CREATININE 0.43*  < > 0.46* 0.42* 0.36*  CALCIUM 8.4  < > 8.8 7.8* 7.9*  MG 2.0  --  2.2  --   --   PHOS  2.6  --  2.3  --   --   GLUCOSE 297*  < > 230* 160* 127*  < > = values in this interval not displayed.  CBG (last 3)   Recent Labs  03/16/14 0359 03/16/14 0801 03/16/14 1240  GLUCAP 142* 154* 167*    Scheduled Meds: . sodium chloride   Intravenous Once  . antiseptic oral rinse  7 mL Mouth Rinse QID  . aspirin  81 mg Oral Daily  . atorvastatin  40 mg Oral q1800  . carvedilol  3.125 mg Oral BID WC  . chlorhexidine  15 mL Mouth Rinse BID  . digoxin  0.125 mg Oral Daily  . doxycycline  100 mg Oral Q12H  . escitalopram  10 mg Oral Daily  . feeding supplement (PRO-STAT SUGAR FREE 64)  30 mL Per Tube BID  . feeding supplement (VITAL HIGH PROTEIN)  1,000 mL Per Tube Q24H  . insulin aspart  0-20 Units Subcutaneous 6 times per day  . insulin glargine  15 Units Subcutaneous Daily  . ipratropium-albuterol  3 mL Nebulization Q6H  . isosorbide mononitrate  30 mg Oral BID  . losartan  25 mg Oral Daily  . potassium chloride  40 mEq Oral BID  . predniSONE  30 mg Oral Q breakfast  . vancomycin  1,000 mg Intravenous Q8H    Continuous Infusions: . sodium chloride 10 mL/hr at 03/16/14 0500  . dextrose 5 % with KCl 20 mEq / L 20 mEq (03/15/14 1727)  . heparin 1,300 Units/hr (03/15/14 2203)  . milrinone 0.125 mcg/kg/min (03/16/14 0816)    Joaquin Courts, RD, LDN, CNSC Pager [phone] After Hours Pager [phone]

## 2014-03-16 NOTE — Progress Notes (Signed)
ANTIBIOTIC CONSULT NOTE - Follow-up  Pharmacy Consult for vancomycin Indication: rule out pneumonia  Allergies  Allergen Reactions  . Penicillins Hives    Tolerates cefepime  . Adhesive [Tape] Rash    Patient Measurements: Height:  (157.5 cm) Weight: 191 lb 12.8 oz (87 kg) IBW/kg (Calculated) : 50.1 Vital Signs: Temp: 98.4 F (36.9 C) (09/24 2007) Temp src: Oral (09/24 2007) BP: 113/49 mmHg (09/24 1959) Pulse Rate: 67 (09/24 1959) Intake/Output from previous day: 09/23 0701 - 09/24 0700 In: 2776.2 [I.V.:1666.2; NG/GT:460; IV Piggyback:650] Out: 1455 [Urine:1455] Intake/Output from this shift:    Labs:  Recent Labs  03/14/14 0500 03/15/14 0430 03/15/14 1000 03/16/14 0500  WBC 11.1* 11.9* 13.1* 8.7  HGB 9.0* 7.9* 8.1* 7.7*  PLT 327 229 231 198  CREATININE 0.46* 0.42*  --  0.36*   Estimated Creatinine Clearance: 80.4 ml/min (by C-G formula based on Cr of 0.36).  Recent Labs  03/16/14 2130  VANCOTROUGH 22.3*     Assessment: 57 YOF who was narrowed to Ancef on 9/21 for MSSA bacteremia but then broadened d/t clinical worsening. WBC has since trended down to 8.7 and pt remains afebrile. A 7-hr vancomycin trough collected today was slightly supra-therapeutic at 22.3.   9/13 cefepime >> 9/21; 9/22 >>9/24 9/21 Ancef >> 9/21 9/13 vanc >> 9/17; 9/22 >> 9/15 ancef > ordered not given 9/24 Doxycycline >>   9/15 blood cx2 - NEG 9/13 blood cx2 - MSSA 9/13 urine - NEG 9/13 wound cx - pseudomonas - pan sensitive  9/22 BAL - NGTD   Goal of Therapy:  Vancomycin trough level 15-20 mcg/ml Heparin level 0.3-0.7  Plan:  1. Change vancomycin to 750 mg IV Q8H  2. F/u renal fxn, C&S, and clinical status   Vinnie Level, PharmD.  Clinical Pharmacist Pager [phone]

## 2014-03-16 NOTE — Progress Notes (Signed)
ANTICOAGULATION CONSULT NOTE   Pharmacy Consult for Heparin  Indication: chest pain/ACS  Allergies  Allergen Reactions  . Penicillins Hives    Tolerates cefepime  . Adhesive [Tape] Rash    Patient Measurements: Height:  (157.5 cm) Weight: 191 lb 12.8 oz (87 kg) IBW/kg (Calculated) : 50.1 Heparin Dosing Weight: 72 kg  Vital Signs: Temp: 97.4 F (36.3 C) (09/24 0802) Temp src: Oral (09/24 0802) BP: 123/59 mmHg (09/24 1200) Pulse Rate: 90 (09/24 1207)  Labs:  Recent Labs  03/13/14 1842  03/14/14 0500  03/14/14 1900 03/15/14 0430 03/15/14 1000 03/16/14 0500  HGB  --   < > 9.0*  --   --  7.9* 8.1* 7.7*  HCT  --   < > 31.7*  --   --  27.2* 28.4* 26.0*  PLT  --   < > 327  --   --  229 231 198  HEPARINUNFRC  --   --   --   < > 0.47 0.41  --  0.42  CREATININE 0.49*  --  0.46*  --   --  0.42*  --  0.36*  TROPONINI 1.78*  --   --   --   --   --  0.58*  --   < > = values in this interval not displayed.  Estimated Creatinine Clearance: 80.4 ml/min (by C-G formula based on Cr of 0.36).  Assessment: 58 yo female with multivessel CAD continues on IV heparin while awaiting possible CABG. Heparin level remains therapeutic at 0.41. H/H down slightly today but no bleeding noted.   Goal of Therapy:  Heparin level 0.3-0.7 units/ml Monitor platelets by anticoagulation protocol: Yes   Plan:  1. Continue heparin gtt 1300 units/hr 2. F/u AM heparin level and CBC 3. F/u surgery plans  Lysle Pearl, PharmD, BCPS Pager # [phone] 03/16/2014 1:29 PM

## 2014-03-16 NOTE — Procedures (Signed)
Objective Swallowing Evaluation: Fiberoptic Endoscopic Evaluation of Swallowing  Patient Details  Name: [name] MRN: [id number] Date of Birth: 02/20/1956  Today's Date: 03/16/2014 Time: 1530-1550 SLP Time Calculation (min): 20 min  Past Medical History:  Past Medical History  Diagnosis Date  . Diabetes mellitus   . Peripheral vascular disease   . Hyperlipidemia   . Obesity   . Hypertension   . Arthritis   . Ulcer of ankle     right  . Coronary artery disease   . COPD (chronic obstructive pulmonary disease)   . NSTEMI (non-ST elevated myocardial infarction)    Past Surgical History:  Past Surgical History  Procedure Laterality Date  . Cholecystectomy    . Femoral-tibial bypass graft Right 09/27/2013    Procedure: BYPASS GRAFT FEMORAL-TIBIAL ARTERY-RIGHT;  Surgeon: Nada Libman, MD;  Location: [facility] OR;  Service: Vascular;  Laterality: Right;  . Abdominal aorta stent Right   . Bypass graft popliteal to tibial Right   . Coronary angioplasty with stent placement      proximcal RCA stent ~ 2001  . Amputation Right 10/14/2013    Procedure: AMPUTATION RAY;  Surgeon: Nadara Mustard, MD;  Location: [facility] OR;  Service: Orthopedics;  Laterality: Right;  Right 2nd Ray Amputation, [place] Graft Right Ankle   HPI:  58 year old female with h/o COPD, DM, peripheral vascular disease, admitted worsening left lower extremity pain and  and abdominal pain, developed acute hypoxic respiratory failure due to pulmonary edema requiring intubation, NSTEMI, MSSA bacteremia after recent angiogram. Initial CXR 9/13 clear however CXR 9/14 notes right basilar airspace opacity may reflect pneumonia.      Assessment / Plan / Recommendation Clinical Impression  Dysphagia Diagnosis: Severe pharyngeal phase dysphagia Clinical impression: Patient presents with a severe acute reversible dysphagia characterized by intubation related sensory deficits, generalized weakness,  and moderately severe laryngeal and  pharyngeal edema resulting in a delayed swallow initiation and decreased laryngeal closure. Deep penetration and aspiration with inconsistent sensation of all consistencies noted despite SLP cueing for various head postures (neutral, chin tuck) and efforful swallow. Cough initially strong with clearance of penetrates/aspirates however patient fatigued quickly making this an inefficient strategy for airway protection. Prognosis for ablility to consume pos good with time off vent. Education complete with patient regarding above. Given severity of deficits, temporary non-oral means of nutrition may be beneficial while regaining function. SLP will f/u closely at bedside for diagnostic po trials and potential for repeat instrumental testing.      Treatment Recommendation  Therapy as outlined in treatment plan below    Diet Recommendation NPO;Alternative means - temporary;Ice chips PRN after oral care   Medication Administration: Via alternative means    Other  Recommendations Oral Care Recommendations: Oral care prior to ice chips (QID)   Follow Up Recommendations   (TBD)    Frequency and Duration min 3x week  2 weeks     General HPI: 58 year old female with h/o COPD, DM, peripheral vascular disease, admitted worsening left lower extremity pain and  and abdominal pain, developed acute hypoxic respiratory failure due to pulmonary edema requiring intubation, NSTEMI, MSSA bacteremia after recent angiogram. Initial CXR 9/13 clear however CXR 9/14 notes right basilar airspace opacity may reflect pneumonia.  Type of Study: Fiberoptic Endoscopic Evaluation of Swallowing Reason for Referral: Objectively evaluate swallowing function Previous Swallow Assessment: none Diet Prior to this Study: NPO Temperature Spikes Noted: No Respiratory Status: Nasal cannula History of Recent Intubation: Yes Length of  Intubations (days): 7 days Date extubated:  (9/14-9/19, 9/21-9/23) Behavior/Cognition:  Alert;Cooperative;Pleasant mood Oral Cavity - Dentition: Adequate natural dentition Oral Motor / Sensory Function: Within functional limits Self-Feeding Abilities: Able to feed self Patient Positioning: Upright in bed Baseline Vocal Quality: Hoarse;Breathy;Low vocal intensity Volitional Cough: Weak Volitional Swallow: Able to elicit Anatomy: Other (Comment) (moderate laryngeal and pharyngeal edema) Pharyngeal Secretions: Normal    Reason for Referral Objectively evaluate swallowing function   Oral Phase Oral Preparation/Oral Phase Oral Phase: WFL   Pharyngeal Phase Pharyngeal Phase Pharyngeal Phase: Impaired Pharyngeal - Honey Pharyngeal - Honey Teaspoon: Delayed swallow initiation;Premature spillage to valleculae;Pharyngeal residue - valleculae;Pharyngeal residue - pyriform sinuses;Trace aspiration;Penetration/Aspiration before swallow;Reduced airway/laryngeal closure Penetration/Aspiration details (honey teaspoon): Material enters airway, passes BELOW cords and not ejected out despite cough attempt by patient Pharyngeal - Nectar Pharyngeal - Nectar Teaspoon: Delayed swallow initiation;Pharyngeal residue - valleculae;Pharyngeal residue - pyriform sinuses;Trace aspiration;Penetration/Aspiration before swallow;Reduced airway/laryngeal closure;Premature spillage to pyriform sinuses Penetration/Aspiration details (nectar teaspoon): Material enters airway, passes BELOW cords and not ejected out despite cough attempt by patient Pharyngeal - Thin Pharyngeal - Ice Chips: Delayed swallow initiation;Pharyngeal residue - valleculae;Pharyngeal residue - pyriform sinuses;Penetration/Aspiration before swallow;Reduced airway/laryngeal closure;Premature spillage to pyriform sinuses;Moderate aspiration Penetration/Aspiration details (ice chips): Material enters airway, passes BELOW cords without attempt by patient to eject out (silent aspiration);Material enters airway, passes BELOW cords and not ejected  out despite cough attempt by patient Pharyngeal - Solids Pharyngeal - Puree: Delayed swallow initiation;Premature spillage to valleculae;Pharyngeal residue - valleculae;Pharyngeal residue - pyriform sinuses;Trace aspiration;Penetration/Aspiration before swallow;Reduced airway/laryngeal closure Penetration/Aspiration details (puree): Material enters airway, passes BELOW cords and not ejected out despite cough attempt by patient  Cervical Esophageal Phase    GO Ferdinand Lango MA, CCC-SLP [phone]              Ferdinand Lango Meryl 03/16/2014, 4:21 PM

## 2014-03-16 NOTE — Progress Notes (Addendum)
PROGRESS NOTE  Subjective:    Extubated yesterday. S/p thoracentesis. Breathing better. Remains on milrinone 0.125. Lasix held. Getting free H20. Sodium 151-> 149 -> 145. CVP 7. No further CP.  Troponin trending down. Bcx 1/2 GPC clusters  Objective:    Vital Signs:   Temp:  [97.4 F (36.3 C)-98.9 F (37.2 C)] 97.4 F (36.3 C) (09/24 0802) Pulse Rate:  [67-118] 67 (09/24 0900) Resp:  [13-26] 17 (09/24 0900) BP: (92-138)/(42-71) 109/48 mmHg (09/24 0900) SpO2:  [93 %-100 %] 94 % (09/24 0900) FiO2 (%):  [40 %] 40 % (09/23 2200) Weight:  [87 kg (191 lb 12.8 oz)] 87 kg (191 lb 12.8 oz) (09/24 0450)  Last BM Date: 03/06/14   24-hour weight change: Weight change: 0.6 kg (1 lb 5.2 oz)  Weight trends: Filed Weights   03/14/14 0300 03/15/14 0310 03/16/14 0450  Weight: 85.1 kg (187 lb 9.8 oz) 86.4 kg (190 lb 7.6 oz) 87 kg (191 lb 12.8 oz)    Intake/Output:  09/23 0701 - 09/24 0700 In: 2776.2 [I.V.:1666.2; NG/GT:460; IV Piggyback:650] Out: 1455 [Urine:1455] Total I/O In: 143 [I.V.:143] Out: 135 [Urine:135]   Physical Exam: BP 109/48  Pulse 67  Temp(Src) 97.4 F (36.3 C) (Oral)  Resp 17  Ht  (1.575 m)  Wt 87 kg (191 lb 12.8 oz)  BMI 35.07 kg/m2  SpO2 94%  Wt Readings from Last 3 Encounters:  03/16/14 87 kg (191 lb 12.8 oz)  03/16/14 87 kg (191 lb 12.8 oz)  03/01/14 88.905 kg (196 lb)    General: Vital signs reviewed and noted. Extubated. Chronically ill appearing  Head: Normocephalic, atraumatic.  Eyes: conjunctivae/corneas clear.  EOM's intact.   Throat: normal  Neck:  IJ in place   Lungs:  Decreased BS throughout no wheezing  Heart:  RRR, no s3 2/6 TR  Abdomen:  Soft, non-tender, non-distended    Extremities:  Warm no edema.   Neurologic: Moves all 4  Psych: A&Ox3    Labs: BMET:  Recent Labs  03/14/14 0500 03/15/14 0430 03/16/14 0500  NA 151* 149* 145  K 3.8 3.9 3.8  CL 105 108 107  CO2 38* 34* 30  GLUCOSE 230* 160* 127*  BUN 31*  25* 18  CREATININE 0.46* 0.42* 0.36*  CALCIUM 8.8 7.8* 7.9*  MG 2.2  --   --   PHOS 2.3  --   --     Liver function tests:  Recent Labs  03/15/14 1830  PROT 5.9*   No results found for this basename: LIPASE, AMYLASE,  in the last 72 hours  CBC:  Recent Labs  03/15/14 0430 03/15/14 1000 03/16/14 0500  WBC 11.9* 13.1* 8.7  NEUTROABS 9.5* 11.4*  --   HGB 7.9* 8.1* 7.7*  HCT 27.2* 28.4* 26.0*  MCV 76.0* 75.9* 75.1*  PLT 229 231 198    Cardiac Enzymes:  Recent Labs  03/13/14 1142 03/13/14 1842 03/15/14 1000  TROPONINI 1.39* 1.78* 0.58*    Coagulation Studies: No results found for this basename: LABPROT, INR,  in the last 72 hours  Other: No components found with this basename: POCBNP,  No results found for this basename: DDIMER,  in the last 72 hours No results found for this basename: HGBA1C,  in the last 72 hours No results found for this basename: CHOL, HDL, LDLCALC, TRIG, CHOLHDL,  in the last 72 hours No results found for this basename: TSH, T4TOTAL, FREET3, T3FREE, THYROIDAB,  in the last 72 hours  Recent Labs  03/15/14 0430  VITAMINB12 376  FOLATE 9.3  FERRITIN 66  TIBC 226*  IRON 56  RETICCTPCT 1.5     Other results:  EKG :  NSR at 93.  Old ant. MI  Medications:    Infusions: . sodium chloride 10 mL/hr at 03/16/14 0500  . dextrose 5 % with KCl 20 mEq / L 20 mEq (03/15/14 1727)  . heparin 1,300 Units/hr (03/15/14 2203)  . milrinone 0.125 mcg/kg/min (03/16/14 0816)    Scheduled Medications: . antiseptic oral rinse  7 mL Mouth Rinse QID  . aspirin  81 mg Oral Daily  . atorvastatin  40 mg Oral q1800  . carvedilol  3.125 mg Oral BID WC  . ceFEPime (MAXIPIME) IV  1 g Intravenous Q8H  . chlorhexidine  15 mL Mouth Rinse BID  . digoxin  0.125 mg Oral Daily  . enalapril  5 mg Oral BID  . escitalopram  10 mg Oral Daily  . famotidine (PEPCID) IV  20 mg Intravenous Q12H  . insulin aspart  0-20 Units Subcutaneous 6 times per day  . insulin  glargine  15 Units Subcutaneous Daily  . ipratropium-albuterol  3 mL Nebulization Q6H  . isosorbide mononitrate  30 mg Oral BID  . potassium chloride  40 mEq Per Tube BID  . predniSONE  30 mg Oral Q breakfast  . vancomycin  1,000 mg Intravenous Q8H    Assessment/ Plan:    1. NSTEMI with severe three-vessel coronary artery disease - not amenable to PCI 2. Acute on chronic systolic congestive heart failure with an EF of 10-15% 3. Acute respiratory failure 4. Fever/leukocytosis    --MSSA bacteremia last week 5. Diabetes mellitus 6. COPD 7. Peripheral vascular disease -status post right iliac stent, status post fem - tibioperoneal trunk. L foot ulcerations 8. Hypernatremia 9. Iron-def anemia  As I put the picture together, I think the initial event was ischemic with flash pulmonary and now capillary leak syndrome. Currently CVP and cardiac output are ok with milrinone.   She has been extubated. I have reviewed her cath films and distal targets are poor particularly in the LAD so with her COPD and poor targets may not be CABG candidate. I have consulted TCTS for formal opinion.   For now will proceed with medical optimization of advanced HF. Will continue asa, milrinone, heparin and statin. Can switch IV NTG to po. Switch enalapril to  low-dose ARB. Continue to hold lasix for at least 1 more day.   BCx 1/2 GPCs clusters. May be contaminant. Will await speciation.   The patient is critically ill with multiple organ systems failure and requires high complexity decision making for assessment and support, frequent evaluation and titration of therapies, application of advanced monitoring technologies and extensive interpretation of multiple databases.   Critical Care Time devoted to patient care services described in this note is 35 Minutes.   Daniel Bensimhon,MD 9:50 AM

## 2014-03-16 NOTE — Progress Notes (Signed)
Responded to page to assist patient with completion of  advance Directives. Prayed with Patient  Per son request.  Will follow as needed and inform unit chaplain for continue support.  03/16/14 1600  Clinical Encounter Type  Visited With Patient and family together;Health care provider  Visit Type Other (Comment) (Assist with completion of Advance Directives)  Referral From Family;Chaplain;Nurse  Spiritual Encounters  Spiritual Needs Prayer;Emotional  Stress Factors  Patient Stress Factors None identified  Family Stress Factors None identified  Advance Directives (For Healthcare)  Does patient have an advance directive? Yes  Alastor Kneale, Chaplain,[phone]

## 2014-03-16 NOTE — Progress Notes (Signed)
PULMONARY / CRITICAL CARE MEDICINE   Name: [name] MRN: [id number] DOB: 23-Apr-1956    ADMISSION DATE:  03/05/2014 CONSULTATION DATE:  9/14  REFERRING MD :  [facility]  CHIEF COMPLAINT: Left leg pain  INITIAL PRESENTATION: Left leg pain, hypotension and hypoxic.  STUDIES:  9/14  Echo- EF 30-35% 9/16  TEE- no LAA thrombus, no valvular vegetation, anterior, anteroseptal, apical, and inferioapical hypokinesis, EF 35% 9/17 EKG- sinus tach, low voltage QRS, cannot r/o anteroseptal infarct 9/18 LHC- 40% distal left main, severe diffuse disease from LAD, 95% stenosis circ, focal 75% mid RCA, LVEF 15%  9/21 L groin vascular ultrasound >>> no evidence of complication around graft site  SIGNIFICANT EVENTS: 9/14 hypoxia, hypotension . ++ Trop I 9/14 intubated 9/15 pos BC MSSA  9/16 TEE>>> see above 9/17- neg 2 liters, still bronchospastic 9/21 acute hypoxemic respiratory failure, re-intubated, cardiology started milrninone 9/23 thora left side 500c removed,  extubated  SUBJECTIVE: left sided thora yesterday, 500cc removed, extubated    VITAL SIGNS: Temp:  [97.4 F (36.3 C)-98.9 F (37.2 C)] 97.4 F (36.3 C) (09/24 0802) Pulse Rate:  [67-118] 67 (09/24 0900) Resp:  [13-26] 17 (09/24 0900) BP: (92-138)/(42-71) 109/48 mmHg (09/24 0900) SpO2:  [93 %-100 %] 94 % (09/24 0900) FiO2 (%):  [40 %] 40 % (09/23 2200) Weight:  [87 kg (191 lb 12.8 oz)] 87 kg (191 lb 12.8 oz) (09/24 0450) HEMODYNAMICS: CVP:  [4 mmHg-8 mmHg] 8 mmHg VENTILATOR SETTINGS: Vent Mode:  [-] BIPAP FiO2 (%):  [40 %] 40 % Set Rate:  [10 bmp-14 bmp] 10 bmp PEEP:  [5 cmH20] 5 cmH20 INTAKE / OUTPUT:  Intake/Output Summary (Last 24 hours) at 03/16/14 1005 Last data filed at 03/16/14 0900  Gross per 24 hour  Intake 2629.69 ml  Output   1240 ml  Net 1389.69 ml    PHYSICAL EXAMINATION:  Gen: awake, alert HEENT: NCAT, EOMi PULM: few crackles in bases CV: Tachy, regular Ab: BS+, soft, nontender Ext: cool, cap  refill intact Derm: R ankle wound dressed, ecchymosis LLQ abdomen Neuro: awake and alert  LABS:  CBC  Recent Labs Lab 03/15/14 0430 03/15/14 1000 03/16/14 0500  WBC 11.9* 13.1* 8.7  HGB 7.9* 8.1* 7.7*  HCT 27.2* 28.4* 26.0*  PLT 229 231 198   Coag's No results found for this basename: APTT, INR,  in the last 168 hours BMET  Recent Labs Lab 03/14/14 0500 03/15/14 0430 03/16/14 0500  NA 151* 149* 145  K 3.8 3.9 3.8  CL 105 108 107  CO2 38* 34* 30  BUN 31* 25* 18  CREATININE 0.46* 0.42* 0.36*  GLUCOSE 230* 160* 127*   Electrolytes  Recent Labs Lab 03/10/14 0425  03/14/14 0500 03/15/14 0430 03/16/14 0500  CALCIUM 8.4  < > 8.8 7.8* 7.9*  MG 2.0  --  2.2  --   --   PHOS 2.6  --  2.3  --   --   < > = values in this interval not displayed. Sepsis Markers No results found for this basename: LATICACIDVEN, PROCALCITON, O2SATVEN,  in the last 168 hours ABG  Recent Labs Lab 03/13/14 1633 03/14/14 0410 03/14/14 1642  PHART 7.606* 7.504* 7.516*  PCO2ART 48.7* 50.4* 44.5  PO2ART 69.0* 70.2* 68.0*   Liver Enzymes No results found for this basename: AST, ALT, ALKPHOS, BILITOT, ALBUMIN,  in the last 168 hours Cardiac Enzymes  Recent Labs Lab 03/13/14 1142 03/13/14 1842 03/15/14 1000  TROPONINI 1.39* 1.78* 0.58*   Glucose  Recent Labs Lab 03/15/14 1221 03/15/14 1610 03/15/14 1906 03/16/14 0034 03/16/14 0359 03/16/14 0801  GLUCAP 255* 201* 130* 119* 142* 154*    Imaging Dg Chest Port 1 View  03/15/2014   CLINICAL DATA:  Status post thoracentesis  EXAM: PORTABLE CHEST - 1 VIEW  COMPARISON:  Study obtained earlier in the day  FINDINGS: There is no appreciable pneumothorax. Left effusion is no longer appreciable. There is a persistent right effusion, stable.  Endotracheal tube tip is 3.8 cm above the carina. Central catheter tip is in the superior vena cava. Nasogastric tube tip and side port are below the diaphragm. Heart is upper normal in size with  pulmonary vascularity within normal limits. No adenopathy.  IMPRESSION: Tube and catheter positions as described. Left effusion resolved. Right effusion persists. No pneumothorax. No change in cardiac silhouette.   Electronically Signed   By: Bretta Bang M.D.   On: 03/15/2014 15:14   Dg Chest Port 1 View  03/15/2014   CLINICAL DATA:  Assess endotracheal tube  EXAM: PORTABLE CHEST - 1 VIEW  COMPARISON:  03/14/2014  FINDINGS: Cardiomediastinal silhouette is stable. Endotracheal tube in place with tip 1.9 cm above the carina. Stable right IJ central line position. Stable NG tube position. Persistent mild congestion/ edema. Again noted small bilateral pleural effusion with bilateral basilar atelectasis or infiltrate.  IMPRESSION: Stable support apparatus. Persistent mild congestion/ edema. Again noted small bilateral pleural effusion with bilateral basilar atelectasis or infiltrate.   Electronically Signed   By: Natasha Mead M.D.   On: 03/15/2014 07:47     ASSESSMENT / PLAN:  PULMONARY OETT>>9/14>>9/19 ext; reintubated 9/21 > extubated 9/23 A: Acute hypoxic resp failure due to ischemia and flash edema Bilateral effusions L >R > transudate, only 500cc removed 9/23 from left Tobacco abuse COPD, with ?acute exacerbation? P:   Continue incentive spirometry Continue prednisone Flutter valve OOB O2 as needed for SpO2 >92% BD scheduled and PRN  CARDIOVASCULAR CVL>>9/14>>9/20 CVL #2 >> 9/21 >> A:  Severe three vessel CAD Ischemic cardiomyopathy Septic shock> resolved NSTEMI P:  TCTS consult for ?CABG Appreciate heart failure service Continue milrinone ACE, B-blocker, ASA per cardiology Continue heparin  RENAL A:   Metabolic alkalosis due to hypokalemia >improving P:   Keep K > 4.0, follow magnesium Check BMET/mg/phos in AM Monitor UOP  GASTROINTESTINAL A:   No acute issues P:   D/c pepcid Advance diet  HEMATOLOGIC A:   Chronic anemia L inguinal hematoma without  evidence of bleeding > Hgb down 9/23, monitor P:  Transfuse per protocol for hgb <8.0 given ischemia Monitor left groin closely Repeat CBC 9/23 PM  INFECTIOUS A:   MSSA Bacteremia after recent angiogram, source uncertain, TEE negative, cultures now clear Septic shock resolved HCAP 9/22 > doubt as quantitative culture negative from BAL BCx2  9/14>>MSSA UC  9/14>>negative WC 9/13>>pseudomonas BC x2 9/15>>neg BC x2 9/22>> 1/2 GPC BAL quant culture 9/22 >> 10K cfu OPF Abx: Vanc  , start date 9/14>>>9/17>>9/21 Abx: cefepime, start date 9/14>>>9/21 Ancef 9/15>>>9/15; restarted 9/21 >> held 9/22 due to fever, HCAP P:   Continue vanc 9/22 for bacteremia until speciated, then resume ceftaz Change antibiotics to Levaquin for AE COPD  ENDOCRINE A:   DM last Hbg a1c  7.2 P:   Increase lantus  To 20 Continue SSI Wean steroids this week  NEUROLOGIC A:  Chronic pain  Sedation needs for vent P:   Fentanyl prn   TODAY'S SUMMARY:  Doing better on 9/24 after thoracentesis  yesterday (though fluid re-accumulated). Continue milrinone, discuss option for cardiac revascularization with TCTS.  Appreciate cardiology.  OOB, narrow antibiotics.  Watch blood culture closely.  Sister updated at bedside and by phone 9/23  Keep in ICU until 9/25, then move to SDU, PCCM service  [place], MD [place] [facility] Pager: [phone] Cell: [phone] If no response, call [phone]

## 2014-03-16 NOTE — Significant Event (Signed)
CRITICAL VALUE ALERT  Critical value received:  Positive Blood Cultures Gram + cocci and Clusters   Date of notification:  03/16/2014  Time of notification:  0055  Critical value read back: yes  Nurse who received alert:  Lavonna Monarch   MD notified (1st page):  Dr. Darrick Penna  Time of first page:  0100  MD notified (2nd page):  Time of second page:  Responding MD:  Dr. Darrick Penna  Time MD responded:  0100

## 2014-03-16 NOTE — Consult Note (Signed)
[address]       [place] [address]             [phone]        [name] [facility] Medical Record #[id number] Date of Birth: 10-Nov-1955  Referring: No ref. provider found Primary Care: Kirk Ruths, MD  Chief Complaint:    Chief Complaint  Patient presents with  . Leg Pain   patient examined, cardiac catheterization, transthoracic echo and transesophageal echocardiogram images all reviewed  History of Present Illness:     58 year old Caucasian female diabetic smoker with peripheral vascular disease was admitted to the hospital with fever, leukocytosis and leg pain and a groin hematoma from her recent arteriogram of the lower extremities. She went on to develop positive blood cultures and also Pseudomonas was cultured from her groin wound. She subsequently developed probable aspiration pneumonia hypertension then elevated cardiac enzymes. She has severe LV dysfunction on her echocardiogram as well as ventriculogram with multivessel CAD--the the vessels are poor targets for grafting. The patient was intubated for 10 days and just recently extubated. She has evidence of persistent pleural effusions from heart failure, she has not been out of bed yet, she has not resumed a normal diet and she is still quite hoarse. A recent blood culture was positive one out of 2 samples. With her sepsis, heart failure, ischemic cardiomyopathy with poor targets, and general deconditioning, malnutrition, and peripheral vascular disease she would not be candidate for CABG. She has had her saphenous vein harvested from her right leg for femoropopliteal bypass and the left leg has evidence of severe venous stasis and probably poor quality conduit.   Current Activity/ Functional Status: Limited activity or exercise tolerance   Zubrod Score: At the time of surgery this patient's most appropriate activity status/level should be described as:     0    Normal activity, no  symptoms     1    Restricted in physical strenuous activity but ambulatory, able to do out light work     2    Ambulatory and capable of self care, unable to do work activities, up and about                 more than 50%  Of the time                                3    Only limited self care, in bed greater than 50% of waking hours     4    Completely disabled, no self care, confined to bed or chair     5    Moribund  Past Medical History  Diagnosis Date  . Diabetes mellitus   . Peripheral vascular disease   . Hyperlipidemia   . Obesity   . Hypertension   . Arthritis   . Ulcer of ankle     right  . Coronary artery disease   . COPD (chronic obstructive pulmonary disease)   . NSTEMI (non-ST elevated myocardial infarction)     Past Surgical History  Procedure Laterality Date  . Cholecystectomy    . Femoral-tibial bypass graft Right 09/27/2013    Procedure: BYPASS GRAFT FEMORAL-TIBIAL ARTERY-RIGHT;  Surgeon: Nada Libman, MD;  Location: [facility] OR;  Service: Vascular;  Laterality: Right;  . Abdominal aorta stent Right   . Bypass graft popliteal to tibial Right   .  Coronary angioplasty with stent placement      proximcal RCA stent ~ 2001  . Amputation Right 10/14/2013    Procedure: AMPUTATION RAY;  Surgeon: Nadara Mustard, MD;  Location: [facility] OR;  Service: Orthopedics;  Laterality: Right;  Right 2nd Ray Amputation, Theraskin Graft Right Ankle    History  Smoking status  . Heavy Tobacco Smoker -- 1.00 packs/day for 30 years  . Types: Cigarettes  Smokeless tobacco  . Never Used    Comment: pt states that she is not quitting smoking and refused smoking cessation materials    History  Alcohol Use No    History   Social History  . Marital Status: Widowed    Spouse Name: N/A    Number of Children: N/A  . Years of Education: N/A   Occupational History  . Not on file.   Social History Main Topics  . Smoking status: Heavy Tobacco Smoker -- 1.00 packs/day for 30 years      Types: Cigarettes  . Smokeless tobacco: Never Used     Comment: pt states that she is not quitting smoking and refused smoking cessation materials  . Alcohol Use: No  . Drug Use: No  . Sexual Activity: Not on file   Other Topics Concern  . Not on file   Social History Narrative  . No narrative on file    Allergies  Allergen Reactions  . Penicillins Hives    Tolerates cefepime  . Adhesive [Tape] Rash    Current Facility-Administered Medications  Medication Dose Route Frequency Provider Last Rate Last Dose  . 0.9 %  sodium chloride infusion   Intravenous Continuous Storm Frisk, MD 10 mL/hr at 03/16/14 0500    . 0.9 %  sodium chloride infusion   Intravenous Once Lupita Leash, MD      . acetaminophen (TYLENOL) tablet 650 mg  650 mg Oral Q4H PRN Corky Crafts, MD      . albuterol (PROVENTIL) (2.5 MG/3ML) 0.083% nebulizer solution 2.5 mg  2.5 mg Nebulization Q3H PRN Nelda Bucks, MD   2.5 mg at 03/09/14 1015  . antiseptic oral rinse (CPC / CETYLPYRIDINIUM CHLORIDE 0.05%) solution 7 mL  7 mL Mouth Rinse QID Lupita Leash, MD   7 mL at 03/16/14 1212  . aspirin chewable tablet 81 mg  81 mg Oral Daily Corky Crafts, MD   81 mg at 03/16/14 1208  . atorvastatin (LIPITOR) tablet 40 mg  40 mg Oral q1800 Merwyn Katos, MD      . carvedilol (COREG) tablet 3.125 mg  3.125 mg Oral BID WC Dolores Patty, MD   3.125 mg at 03/15/14 5643  . chlorhexidine (PERIDEX) 0.12 % solution 15 mL  15 mL Mouth Rinse BID Lupita Leash, MD   15 mL at 03/16/14 0754  . dextrose 5 % with KCl 20 mEq / L  infusion  20 mEq Intravenous Continuous Merwyn Katos, MD 50 mL/hr at 03/15/14 1727 20 mEq at 03/15/14 1727  . digoxin (LANOXIN) tablet 0.125 mg  0.125 mg Oral Daily Dolores Patty, MD   0.125 mg at 03/16/14 1207  . doxycycline (VIBRA-TABS) tablet 100 mg  100 mg Oral Q12H Lupita Leash, MD      . escitalopram (LEXAPRO) tablet 10 mg  10 mg Oral Daily Merwyn Katos,  MD      . fentaNYL (SUBLIMAZE) injection 12.5-25 mcg  12.5-25 mcg Intravenous Q2H PRN Merwyn Katos, MD  12.5 mcg at 03/16/14 1047  . heparin ADULT infusion 100 units/mL (25000 units/250 mL)  1,300 Units/hr Intravenous Continuous Lupita Leash, MD 13 mL/hr at 03/15/14 2203 1,300 Units/hr at 03/15/14 2203  . insulin aspart (novoLOG) injection 0-20 Units  0-20 Units Subcutaneous 6 times per day Lupita Leash, MD   4 Units at 03/16/14 1254  . insulin glargine (LANTUS) injection 15 Units  15 Units Subcutaneous Daily Merwyn Katos, MD   15 Units at 03/16/14 1047  . ipratropium-albuterol (DUONEB) 0.5-2.5 (3) MG/3ML nebulizer solution 3 mL  3 mL Nebulization Q6H Lupita Leash, MD   3 mL at 03/16/14 1334  . isosorbide mononitrate (IMDUR) 24 hr tablet 30 mg  30 mg Oral BID Dolores Patty, MD   30 mg at 03/16/14 1208  . losartan (COZAAR) tablet 25 mg  25 mg Oral Daily Bevelyn Buckles Bensimhon, MD      . milrinone [facility]) 20 MG/100ML (0.2 mg/mL) infusion  0.125 mcg/kg/min Intravenous Continuous Dolores Patty, MD 3.5 mL/hr at 03/16/14 0816 0.125 mcg/kg/min at 03/16/14 0816  . ondansetron (ZOFRAN) injection 4 mg  4 mg Intravenous Q6H PRN Corky Crafts, MD      . ondansetron [facility]) tablet 4 mg  4 mg Oral Q6H PRN Eduard Clos, MD      . potassium chloride SA (K-DUR,KLOR-CON) CR tablet 40 mEq  40 mEq Oral BID Lupita Leash, MD      . predniSONE (DELTASONE) tablet 30 mg  30 mg Oral Q breakfast Merwyn Katos, MD      . vancomycin (VANCOCIN) IVPB 1000 mg/200 mL premix  1,000 mg Intravenous Q8H Drake Leach Rumbarger, RPH   1,000 mg at 03/16/14 1417    Prescriptions prior to admission  Medication Sig Dispense Refill  . alprazolam (XANAX) 2 MG tablet Take 1 mg by mouth every 6 (six) hours as needed for sleep or anxiety.       Marland Kitchen aspirin EC 325 MG tablet Take 325 mg by mouth daily.      Marland Kitchen atorvastatin (LIPITOR) 20 MG tablet Take 20 mg by mouth daily.      Marland Kitchen escitalopram  (LEXAPRO) 10 MG tablet Take 10 mg by mouth daily.      . fentaNYL (DURAGESIC - DOSED MCG/HR) 100 MCG/HR Place 100 mcg onto the skin every 3 (three) days.      . fluticasone (FLONASE) 50 MCG/ACT nasal spray Place 1 spray into both nostrils daily as needed for allergies.       Marland Kitchen gabapentin (NEURONTIN) 600 MG tablet Take 600 mg by mouth 3 (three) times daily.      Marland Kitchen glimepiride (AMARYL) 2 MG tablet Take 2 mg by mouth 2 (two) times daily.      . hydrochlorothiazide (MICROZIDE) 12.5 MG capsule Take 12.5 mg by mouth daily.      . meloxicam (MOBIC) 15 MG tablet Take 15 mg by mouth daily.      . metFORMIN (GLUCOPHAGE) 500 MG tablet Take 1,000 mg by mouth 2 (two) times daily with a meal.      . oxycodone (ROXICODONE) 30 MG immediate release tablet Take 30 mg by mouth every 4 (four) hours as needed for pain.       Marland Kitchen quinapril (ACCUPRIL) 20 MG tablet Take 20 mg by mouth daily.      . VESICARE 5 MG tablet Take 5 mg by mouth daily.         Family History  Problem  Relation Age of Onset  . Cancer Mother   . Heart disease Father   . Hyperlipidemia Father   . Hypertension Father   . Other Father     varicose veins  . Heart attack Father   . Heart disease Sister   . Hyperlipidemia Sister   . Hypertension Sister   . Other Sister     varicose veins  . Heart attack Sister   . Heart disease Brother   . Hypertension Brother   . Hyperlipidemia Brother   . Other Brother     varicose veins  . Heart attack Brother      Review of Systems:     Cardiac Review of Systems: Y or N  Chest Pain [ no   ]  Resting SOB [ no  ] Exertional SOB  [ yes ]  Orthopnea [ yes ]   Pedal Edema [  yes ]    Palpitations [no  ] Syncope  [ no ]   Presyncope [ no  ]  General Review of Systems: [Y] = yes [  ]=no Constitional: recent weight change [  ]; anorexia [  ]; fatigue [  ]; nausea [  ]; night sweats [  ]; fever [  ]; or chills [  ]                                                               Dental: poor dentition[   yes]; Last Dentist visit: Greater than 12 months   Eye : blurred vision [  ]; diplopia [   ]; vision changes [  ];  Amaurosis fugax[  ]; Resp: cough [  ];  wheezing[  ];  hemoptysis[  ]; shortness of breath[yes  ]; paroxysmal nocturnal dyspnea[  ]; dyspnea on exertion[  ]; or orthopnea[  ];  GI:  gallstones[  ], vomiting[  ];  dysphagia[  ]; melena[  ];  hematochezia [  ]; heartburn[  ];   Hx of  Colonoscopy[  ]; GU: kidney stones [  ]; hematuria[  ];   dysuria [  ];  nocturia[  ];  history of     obstruction [  ]; urinary frequency [  ]             Skin: rash, swelling[ yes ];, hair loss[  ];  peripheral edema[ yes ];  or itching[  ]; Musculosketetal: myalgias[  ];  joint swelling[  ];  joint erythema[  ];  joint pain[  ];  back pain[  ];  Heme/Lymph: bruising[  ];  bleeding[  ];  anemia[  ];  Neuro: TIA[  ];  headaches[  ];  stroke[  ];  vertigo[  ];  seizures[  ];   paresthesias[  ];  difficulty walking[  ];  Psych:depression[  ]; anxiety[  ];  Endocrine: diabetes[yes  ];  thyroid dysfunction[  ];  Immunizations: Flu [  ]; Pneumococcal[  ];  Other:  Physical Exam: BP 120/51  Pulse 79  Temp(Src) 97.4 F (36.3 C) (Oral)  Resp 17  Ht  (1.575 m)  Wt 191 lb 12.8 oz (87 kg)  BMI 35.07 kg/m2  SpO2 96% Gen. acute and chronically ill middle-aged female in the ICU HEENT-normocephalic, hoarse voice, broken teeth with  necrotic molar tooth right  maxilla Neck-no JVD noted Lungs-coarse breath sounds right greater than left Cardiac-regular rhythm 2/6 systolic murmur Abdomen-soft nontender without pulsatile mass Extremities-surgical scar or right leg, venous stasis changes from mid-tibia to feet bilaterally, status post amputation of right toe Neuro-responsive but weak overall no focal motor deficit    Diagnostic Studies & Laboratory data:     Recent Radiology Findings:   Dg Chest Port 1 View  03/16/2014   CLINICAL DATA:  Shortness of breath  EXAM: PORTABLE CHEST - 1 VIEW  COMPARISON:   03/15/2014  FINDINGS: Cardiac shadow remains mildly enlarged. Bilateral pleural effusions are noted and mildly increased from the prior exam. Increasing right basilar atelectasis is noted. A right jugular central line is seen in the SVC without evidence of pneumothorax. The endotracheal tube and nasogastric catheter been removed in the interval.  IMPRESSION: Mild increase in bilateral pleural effusions with increase in right basilar atelectasis.   Electronically Signed   By: Alcide Clever M.D.   On: 03/16/2014 07:12   Dg Chest Port 1 View  03/15/2014   CLINICAL DATA:  Status post thoracentesis  EXAM: PORTABLE CHEST - 1 VIEW  COMPARISON:  Study obtained earlier in the day  FINDINGS: There is no appreciable pneumothorax. Left effusion is no longer appreciable. There is a persistent right effusion, stable.  Endotracheal tube tip is 3.8 cm above the carina. Central catheter tip is in the superior vena cava. Nasogastric tube tip and side port are below the diaphragm. Heart is upper normal in size with pulmonary vascularity within normal limits. No adenopathy.  IMPRESSION: Tube and catheter positions as described. Left effusion resolved. Right effusion persists. No pneumothorax. No change in cardiac silhouette.   Electronically Signed   By: Bretta Bang M.D.   On: 03/15/2014 15:14   Dg Chest Port 1 View  03/15/2014   CLINICAL DATA:  Assess endotracheal tube  EXAM: PORTABLE CHEST - 1 VIEW  COMPARISON:  03/14/2014  FINDINGS: Cardiomediastinal silhouette is stable. Endotracheal tube in place with tip 1.9 cm above the carina. Stable right IJ central line position. Stable NG tube position. Persistent mild congestion/ edema. Again noted small bilateral pleural effusion with bilateral basilar atelectasis or infiltrate.  IMPRESSION: Stable support apparatus. Persistent mild congestion/ edema. Again noted small bilateral pleural effusion with bilateral basilar atelectasis or infiltrate.   Electronically Signed   By:  Natasha Mead M.D.   On: 03/15/2014 07:47      Recent Lab Findings: Lab Results  Component Value Date   WBC 8.7 03/16/2014   HGB 7.7* 03/16/2014   HCT 26.0* 03/16/2014   PLT 198 03/16/2014   GLUCOSE 127* 03/16/2014   ALT 50* 03/08/2014   AST 41* 03/08/2014   NA 145 03/16/2014   K 3.8 03/16/2014   CL 107 03/16/2014   CREATININE 0.36* 03/16/2014   BUN 18 03/16/2014   CO2 30 03/16/2014   INR 1.45 03/06/2014   HGBA1C 7.6* 09/24/2013      Assessment / Plan:     Patient is recovering from severe sepsis with subendocardial MI sustained during her critical illness. She has severe LV dysfunction with poor targets for grafting and significant co- morbid acute and chronic illnesses which would preclude her from obtaining benefit from  CABG.  Recommend medical therapy for her ischemic cardiomyopathy-systolic heart failure       @ 03/16/2014 3:07 PM

## 2014-03-16 NOTE — Plan of Care (Signed)
Problem: SLP Dysphagia Goals Goal: Patient will demonstrate readiness for PO's Patient will demonstrate readiness for PO's and/or instrumental swallow study as evidenced by: Ability to produce audible phonation and consume ice chip trials without overt s/s of aspiration at bedside with mod assist for use of compensatory strategies

## 2014-03-16 NOTE — Evaluation (Signed)
Clinical/Bedside Swallow Evaluation Patient Details  Name: Kaitlin PARLIN MRN: [id number] Date of Birth: February 10, 1956  Today's Date: 03/16/2014 Time: 1105-1125 SLP Time Calculation (min): 20 min  Past Medical History:  Past Medical History  Diagnosis Date  . Diabetes mellitus   . Peripheral vascular disease   . Hyperlipidemia   . Obesity   . Hypertension   . Arthritis   . Ulcer of ankle     right  . Coronary artery disease   . COPD (chronic obstructive pulmonary disease)   . NSTEMI (non-ST elevated myocardial infarction)    Past Surgical History:  Past Surgical History  Procedure Laterality Date  . Cholecystectomy    . Femoral-tibial bypass graft Right 09/27/2013    Procedure: BYPASS GRAFT FEMORAL-TIBIAL ARTERY-RIGHT;  Surgeon: Nada Libman, MD;  Location: [facility] OR;  Service: Vascular;  Laterality: Right;  . Abdominal aorta stent Right   . Bypass graft popliteal to tibial Right   . Coronary angioplasty with stent placement      proximcal RCA stent ~ 2001  . Amputation Right 10/14/2013    Procedure: AMPUTATION RAY;  Surgeon: Nadara Mustard, MD;  Location: [facility] OR;  Service: Orthopedics;  Laterality: Right;  Right 2nd Ray Amputation, Theraskin Graft Right Ankle   HPI:  58 year old female with h/o COPD, DM, peripheral vascular disease, admitted worsening left lower extremity pain and  and abdominal pain, developed acute hypoxic respiratory failure due to pulmonary edema requiring intubation, NSTEMI, MSSA bacteremia after recent angiogram. Initial CXR 9/13 clear however CXR 9/14 notes right basilar airspace opacity may reflect pneumonia.    Assessment / Plan / Recommendation Clinical Impression  Patient with a suspected acute reversible dysphagia s/p multiple intubations impacting glottal closure and likely timing of swallow. Given prolonged intubation, severity of dysphonia, and weak cough, cannot r/o silent aspiration. Recommend NPO and FEES this pm to instrumentally assess swallow.     Aspiration Risk  Severe    Diet Recommendation NPO   Medication Administration: Via alternative means    Other  Recommendations Recommended Consults: FEES Oral Care Recommendations:  (QID)   Follow Up Recommendations   (TBD)    Frequency and Duration        Pertinent Vitals/Pain N/a     Swallow Study    General HPI: 58 year old female with h/o COPD, DM, peripheral vascular disease, admitted worsening left lower extremity pain and  and abdominal pain, developed acute hypoxic respiratory failure due to pulmonary edema requiring intubation, NSTEMI, MSSA bacteremia after recent angiogram. Initial CXR 9/13 clear however CXR 9/14 notes right basilar airspace opacity may reflect pneumonia.  Type of Study: Bedside swallow evaluation Previous Swallow Assessment: none Diet Prior to this Study: NPO Temperature Spikes Noted: No Respiratory Status: Nasal cannula History of Recent Intubation: Yes Length of Intubations (days): 7 days Date extubated:  (9/14-9/19, 9/21-9/23) Behavior/Cognition: Alert;Cooperative;Pleasant mood Oral Cavity - Dentition: Adequate natural dentition Self-Feeding Abilities: Able to feed self Patient Positioning: Upright in bed Baseline Vocal Quality: Hoarse;Breathy;Low vocal intensity Volitional Cough: Weak Volitional Swallow: Able to elicit    Oral/Motor/Sensory Function Overall Oral Motor/Sensory Function: Appears within functional limits for tasks assessed   Ice Chips Ice chips: Impaired Presentation: Spoon Pharyngeal Phase Impairments: Suspected delayed Swallow;Throat Clearing - Immediate   Thin Liquid Thin Liquid: Impaired Presentation: Cup;Self Fed Pharyngeal  Phase Impairments: Suspected delayed Swallow;Cough - Delayed    Nectar Thick Nectar Thick Liquid: Not tested   Honey Thick Honey Thick Liquid: Not tested  Puree Puree: Within functional limits Presentation: Spoon;Self Fed   Solid   GO   Khadim Lundberg MA, CCC-SLP [phone]  Solid: Not  tested       [employer] Meryl 03/16/2014,11:29 AM

## 2014-03-17 ENCOUNTER — Inpatient Hospital Stay (HOSPITAL_COMMUNITY): Payer: BC Managed Care – PPO

## 2014-03-17 LAB — CULTURE, BLOOD (ROUTINE X 2)

## 2014-03-17 LAB — BASIC METABOLIC PANEL
ANION GAP: 9 (ref 5–15)
BUN: 16 mg/dL (ref 6–23)
CO2: 28 meq/L (ref 19–32)
Calcium: 8.1 mg/dL — ABNORMAL LOW (ref 8.4–10.5)
Chloride: 109 mEq/L (ref 96–112)
Creatinine, Ser: 0.37 mg/dL — ABNORMAL LOW (ref 0.50–1.10)
GFR calc Af Amer: >90 mL/min (ref >90)
Glucose, Bld: 209 mg/dL — ABNORMAL HIGH (ref 70–99)
Potassium: 4.6 mEq/L (ref 3.7–5.3)
SODIUM: 146 meq/L (ref 137–147)

## 2014-03-17 LAB — GLUCOSE, CAPILLARY
GLUCOSE-CAPILLARY: 225 mg/dL — AB (ref 70–99)
GLUCOSE-CAPILLARY: 217 mg/dL — AB (ref 70–99)
Glucose-Capillary: 173 mg/dL — ABNORMAL HIGH (ref 70–99)
Glucose-Capillary: 204 mg/dL — ABNORMAL HIGH (ref 70–99)
Glucose-Capillary: 209 mg/dL — ABNORMAL HIGH (ref 70–99)
Glucose-Capillary: 253 mg/dL — ABNORMAL HIGH (ref 70–99)

## 2014-03-17 LAB — HEPARIN LEVEL (UNFRACTIONATED): HEPARIN UNFRACTIONATED: 0.3 [IU]/mL (ref 0.30–0.70)

## 2014-03-17 LAB — CARBOXYHEMOGLOBIN
CARBOXYHEMOGLOBIN: 1.6 % — AB (ref 0.5–1.5)
METHEMOGLOBIN: 0.8 % (ref 0.0–1.5)
O2 Saturation: 57.1 %
Total hemoglobin: 9.7 g/dL — ABNORMAL LOW (ref 12.0–16.0)

## 2014-03-17 LAB — PHOSPHORUS
PHOSPHORUS: 3 mg/dL (ref 2.3–4.6)
Phosphorus: 3 mg/dL (ref 2.3–4.6)

## 2014-03-17 LAB — TYPE AND SCREEN
ABO/RH(D): AB POS
Antibody Screen: NEGATIVE
UNIT DIVISION: 0

## 2014-03-17 LAB — CBC
HCT: 29.7 % — ABNORMAL LOW (ref 36.0–46.0)
Hemoglobin: 9.1 g/dL — ABNORMAL LOW (ref 12.0–15.0)
MCH: 22.7 pg — ABNORMAL LOW (ref 26.0–34.0)
MCHC: 30.6 g/dL (ref 30.0–36.0)
MCV: 74.1 fL — AB (ref 78.0–100.0)
PLATELETS: 196 10*3/uL (ref 150–400)
RBC: 4.01 MIL/uL (ref 3.87–5.11)
RDW: 17.5 % — ABNORMAL HIGH (ref 11.5–15.5)
WBC: 6.3 10*3/uL (ref 4.0–10.5)

## 2014-03-17 LAB — MAGNESIUM
MAGNESIUM: 2.1 mg/dL (ref 1.5–2.5)
MAGNESIUM: 2 mg/dL (ref 1.5–2.5)

## 2014-03-17 MED ORDER — DOXYCYCLINE HYCLATE 100 MG PO TABS
100.0000 mg | ORAL_TABLET | Freq: Two times a day (BID) | ORAL | Status: DC
  Administered 2014-03-17 – 2014-03-20 (×6): 100 mg via ORAL
  Filled 2014-03-17 (×6): qty 1

## 2014-03-17 MED ORDER — LOSARTAN POTASSIUM 50 MG PO TABS
50.0000 mg | ORAL_TABLET | Freq: Every day | ORAL | Status: DC
  Administered 2014-03-17 – 2014-03-20 (×4): 50 mg via ORAL
  Filled 2014-03-17 (×4): qty 1

## 2014-03-17 MED ORDER — GABAPENTIN 300 MG PO CAPS
300.0000 mg | ORAL_CAPSULE | Freq: Three times a day (TID) | ORAL | Status: DC
  Administered 2014-03-17 – 2014-03-20 (×11): 300 mg via ORAL
  Filled 2014-03-17 (×14): qty 1

## 2014-03-17 MED ORDER — CEFAZOLIN SODIUM 1-5 GM-% IV SOLN
1.0000 g | Freq: Three times a day (TID) | INTRAVENOUS | Status: DC
  Administered 2014-03-17 – 2014-03-18 (×5): 1 g via INTRAVENOUS
  Filled 2014-03-17 (×9): qty 50

## 2014-03-17 MED ORDER — FUROSEMIDE 40 MG PO TABS
40.0000 mg | ORAL_TABLET | Freq: Every day | ORAL | Status: DC
  Administered 2014-03-17 – 2014-03-18 (×2): 40 mg via ORAL
  Filled 2014-03-17 (×3): qty 1

## 2014-03-17 MED ORDER — HEPARIN SODIUM (PORCINE) 5000 UNIT/ML IJ SOLN
5000.0000 [IU] | Freq: Three times a day (TID) | INTRAMUSCULAR | Status: DC
  Administered 2014-03-17 – 2014-03-27 (×27): 5000 [IU] via SUBCUTANEOUS
  Filled 2014-03-17 (×32): qty 1

## 2014-03-17 MED ORDER — DOXYCYCLINE HYCLATE 100 MG IV SOLR
100.0000 mg | Freq: Two times a day (BID) | INTRAVENOUS | Status: DC
  Filled 2014-03-17: qty 100

## 2014-03-17 NOTE — Progress Notes (Signed)
PULMONARY / CRITICAL CARE MEDICINE   Name: [name] MRN: [id number] DOB: 03-Aug-1955    ADMISSION DATE:  03/05/2014 CONSULTATION DATE:  9/14  REFERRING MD :  [facility]  CHIEF COMPLAINT: Left leg pain  INITIAL PRESENTATION: Left leg pain, hypotension and hypoxic.  STUDIES:  9/14  Echo- EF 30-35% 9/16  TEE- no LAA thrombus, no valvular vegetation, anterior, anteroseptal, apical, and inferioapical hypokinesis, EF 35% 9/17 EKG- sinus tach, low voltage QRS, cannot r/o anteroseptal infarct 9/18 LHC- 40% distal left main, severe diffuse disease from LAD, 95% stenosis circ, focal 75% mid RCA, LVEF 15%  9/21 L groin vascular ultrasound >>> no evidence of complication around graft site  SIGNIFICANT EVENTS: 9/14 hypoxia, hypotension . ++ Trop I 9/14 intubated 9/15 pos BC MSSA  9/16 TEE>>> see above 9/17- neg 2 liters, still bronchospastic 9/21 acute hypoxemic respiratory failure, re-intubated, cardiology started milrninone 9/23 thora left side 500c removed,  Extubated 9/24 failed swallow evaluation; TCTS consult > not a CABG candidate, rec medical management, transfused 1 U PRBC   SUBJECTIVE: failed swallow eval, reports feeling much better   VITAL SIGNS: Temp:  [97.5 F (36.4 C)-98.4 F (36.9 C)] 97.5 F (36.4 C) (09/25 0800) Pulse Rate:  [63-103] 71 (09/25 1000) Resp:  [14-29] 20 (09/25 1000) BP: (91-134)/(37-69) 134/63 mmHg (09/25 1000) SpO2:  [90 %-100 %] 96 % (09/25 1000) Weight:  [88.3 kg (194 lb 10.7 oz)] 88.3 kg (194 lb 10.7 oz) (09/25 0500) HEMODYNAMICS: CVP:  [4 mmHg-8 mmHg] 8 mmHg VENTILATOR SETTINGS:   INTAKE / OUTPUT:  Intake/Output Summary (Last 24 hours) at 03/17/14 1029 Last data filed at 03/17/14 1000  Gross per 24 hour  Intake   2988 ml  Output    855 ml  Net   2133 ml    PHYSICAL EXAMINATION:  Gen: awake, alert HEENT: NCAT, EOMi PULM: CTA B today CV: Tachy, regular Ab: BS+, soft, nontender Ext: cool, cap refill intact Derm: R ankle wound  dressed, ecchymosis LLQ abdomen Neuro: awake and alert  LABS:  CBC  Recent Labs Lab 03/15/14 1000 03/16/14 0500 03/17/14 0400  WBC 13.1* 8.7 6.3  HGB 8.1* 7.7* 9.1*  HCT 28.4* 26.0* 29.7*  PLT 231 198 196   Coag's No results found for this basename: APTT, INR,  in the last 168 hours BMET  Recent Labs Lab 03/15/14 0430 03/16/14 0500 03/17/14 0400  NA 149* 145 146  K 3.9 3.8 4.6  CL 108 107 109  CO2 34* 30 28  BUN 25* 18 16  CREATININE 0.42* 0.36* 0.37*  GLUCOSE 160* 127* 209*   Electrolytes  Recent Labs Lab 03/14/14 0500 03/15/14 0430 03/16/14 0500 03/16/14 1655 03/17/14 0400  CALCIUM 8.8 7.8* 7.9*  --  8.1*  MG 2.2  --   --  2.0 2.1  PHOS 2.3  --   --  2.9 3.0   Sepsis Markers No results found for this basename: LATICACIDVEN, PROCALCITON, O2SATVEN,  in the last 168 hours ABG  Recent Labs Lab 03/13/14 1633 03/14/14 0410 03/14/14 1642  PHART 7.606* 7.504* 7.516*  PCO2ART 48.7* 50.4* 44.5  PO2ART 69.0* 70.2* 68.0*   Liver Enzymes No results found for this basename: AST, ALT, ALKPHOS, BILITOT, ALBUMIN,  in the last 168 hours Cardiac Enzymes  Recent Labs Lab 03/13/14 1142 03/13/14 1842 03/15/14 1000  TROPONINI 1.39* 1.78* 0.58*   Glucose  Recent Labs Lab 03/16/14 0801 03/16/14 1240 03/16/14 1548 03/16/14 2006 03/17/14 0040 03/17/14 0843  GLUCAP 154* 167* 178* 114*  173* 204*    Imaging Dg Chest Port 1 View  03/16/2014   CLINICAL DATA:  Shortness of breath  EXAM: PORTABLE CHEST - 1 VIEW  COMPARISON:  03/15/2014  FINDINGS: Cardiac shadow remains mildly enlarged. Bilateral pleural effusions are noted and mildly increased from the prior exam. Increasing right basilar atelectasis is noted. A right jugular central line is seen in the SVC without evidence of pneumothorax. The endotracheal tube and nasogastric catheter been removed in the interval.  IMPRESSION: Mild increase in bilateral pleural effusions with increase in right basilar  atelectasis.   Electronically Signed   By: Alcide Clever M.D.   On: 03/16/2014 07:12   Dg Abd Portable 1v  03/16/2014   CLINICAL DATA:  Feeding tube placement  EXAM: PORTABLE ABDOMEN - 1 VIEW  COMPARISON:  03/08/2014  FINDINGS: The weighted feeding tube is identified in the left upper quadrant of the abdomen. This is in the expected location of the body of stomach. Right upper quadrant surgical clips are noted. There is enteric contrast material opacifying the colon.  IMPRESSION: 1. Tip of the feeding tube is in the expected location of the body of stomach.   Electronically Signed   By: Signa Kell M.D.   On: 03/16/2014 19:23     ASSESSMENT / PLAN:  PULMONARY OETT>>9/14>>9/19 ext; reintubated 9/21 > extubated 9/23 A: Acute hypoxic resp failure due to ischemia and flash edema Bilateral effusions L >R > transudate, only 500cc removed 9/23 from left Tobacco abuse COPD, with improving exacerbation P:   Continue incentive spirometry Wean prednisone Flutter valve OOB O2 as needed for SpO2 >92% BD scheduled and PRN  CARDIOVASCULAR CVL>>9/14>>9/20 CVL #2 >> 9/21 >> A:  Severe three vessel CAD > not a CABG candidate Ischemic cardiomyopathy Septic shock> resolved NSTEMI P:  Appreciate heart failure service Continue milrinone ACE, B-blocker, ASA per cardiology Continue heparin Lasix today  RENAL A:   Hypophos P:   Keep K > 4.0, follow magnesium Check BMET/mg/phos in AM Monitor UOP Replete Phos  GASTROINTESTINAL A:   No acute issues P:   D/c pepcid Advance diet  HEMATOLOGIC A:   Chronic anemia L inguinal hematoma without evidence of bleeding   P:  Transfuse per protocol for hgb <8.0 given ischemia Monitor left groin closely   INFECTIOUS A:   MSSA Bacteremia after recent angiogram, source uncertain, TEE negative, cultures now clear Septic shock resolved HCAP 9/22 > doubt as quantitative culture negative from BAL BCx2  9/14>>MSSA UC  9/14>>negative WC  9/13>>pseudomonas BC x2 9/15>>neg BC x2 9/22>> 1/2 coag neg staph BAL quant culture 9/22 >> 10K cfu OPF Abx: Vanc  , start date 9/14>>>9/17off; 9/21back on>>9/25 off Abx: cefepime, start date 9/14 (HCAP?)>>>9/21 Ancef 9/15>>>9/21;  resumed 9/25 P:   D/C vanc Resume cefazolin, plan 3 weeks total per last ID note Change antibiotics to Levaquin for AE COPD > plan 7 days total antibiotics (stop 9/27)  ENDOCRINE A:   DM last Hbg a1c  7.2 P:   Continue lantus at 20 Continue SSI Wean steroids this week  NEUROLOGIC A:  Chronic pain  Sedation needs for vent P:   Fentanyl prn   TODAY'S SUMMARY:  Doing better on 9/24 after thoracentesis yesterday (though fluid re-accumulated). Continue milrinone, medical management.  Appreciate cardiology.  OOB, narrow antibiotics again today.   Sister updated at bedside 9/25  Transfer to [facility] service SDU 9/25, PCCM off  Heber Granger, MD [place] PCCM Pager: [phone] Cell: [phone] If no response, call [phone]

## 2014-03-17 NOTE — Evaluation (Signed)
Physical Therapy Evaluation Patient Details Name: [name] MRN: [id number] DOB: 1956/06/02 Today's Date: 03/17/2014   History of Present Illness    58 year old female with h/o COPD, DM, peripheral vascular disease, admitted worsening left lower extremity pain and and abdominal pain, developed acute hypoxic respiratory failure due to pulmonary edema requiring intubation, NSTEMI, MSSA bacteremia after recent angiogram. Initial CXR 9/13 clear however CXR 9/14 notes right basilar airspace opacity may reflect pneumonia.    Clinical Impression  Pt admitted with above. Pt currently with functional limitations due to the deficits listed below (see PT Problem List). Pt is a great rehab candidate.  Should progress well.   Pt will benefit from skilled PT to increase their independence and safety with mobility to allow discharge to the venue listed below.     Follow Up Recommendations CIR;Supervision/Assistance - 24 hour    Equipment Recommendations  Other (comment) (TBA)    Recommendations for Other Services Rehab consult     Precautions / Restrictions Precautions Precautions: Fall Restrictions Weight Bearing Restrictions: No      Mobility  Bed Mobility Overal bed mobility: Needs Assistance;+2 for physical assistance Bed Mobility: Supine to Sit     Supine to sit: Mod assist;+2 for physical assistance     General bed mobility comments: Pt needed assist to move LEs off bed and for elevation of trunk.  Transfers Overall transfer level: Needs assistance Equipment used: 2 person hand held assist Transfers: Sit to/from [employer] Sit to Stand: Mod assist;+2 physical assistance Stand pivot transfers: Mod assist;+2 physical assistance       General transfer comment: Needed assist with pad to achieve stand and pt unable to achieve full upright.  Took two attempts to stand enough to pivot to chair.  Pt with weak hip extensors and trunk extension.     Ambulation/Gait                Stairs            Wheelchair Mobility    Modified Rankin (Stroke Patients Only)       Balance Overall balance assessment: Needs assistance;History of Falls Sitting-balance support: Bilateral upper extremity supported;Feet supported Sitting balance-Leahy Scale: Poor Sitting balance - Comments: Needs UE support to sit 5 min EOB with min asssit.  Postural control: Posterior lean Standing balance support: Bilateral upper extremity supported;During functional activity Standing balance-Leahy Scale: Poor Standing balance comment: Needed mod asssit to stand with UE support.                               Pertinent Vitals/Pain Pain Assessment: No/denies pain VSS    Home Living Family/patient expects to be discharged to:: Private residence Living Arrangements: Alone;Children Available Help at Discharge: Family;Available 24 hours/day Type of Home: House Home Access: Level entry     Home Layout: One level Home Equipment: Walker - 2 wheels;Shower seat;Cane - single point;Adaptive equipment      Prior Function Level of Independence: Independent               Hand Dominance   Dominant Hand: Left    Extremity/Trunk Assessment   Upper Extremity Assessment: Defer to OT evaluation           Lower Extremity Assessment: Generalized weakness         Communication   Communication: No difficulties  Cognition Arousal/Alertness: Awake/alert Behavior During Therapy: WFL for tasks assessed/performed Overall Cognitive Status:  Within Functional Limits for tasks assessed                      General Comments      Exercises General Exercises - Lower Extremity Ankle Circles/Pumps: AROM;Both;10 reps;Seated Long Arc Quad: AROM;Both;10 reps;Seated      Assessment/Plan    PT Assessment Patient needs continued PT services  PT Diagnosis Generalized weakness   PT Problem List Decreased activity  tolerance;Decreased balance;Decreased mobility;Decreased safety awareness;Decreased knowledge of use of DME;Decreased knowledge of precautions  PT Treatment Interventions DME instruction;Gait training;Functional mobility training;Therapeutic activities;Therapeutic exercise;Balance training;Patient/family education   PT Goals (Current goals can be found in the Care Plan section) Acute Rehab PT Goals Patient Stated Goal: to get better PT Goal Formulation: With patient Time For Goal Achievement: 03/31/14 Potential to Achieve Goals: Good    Frequency Min 3X/week   Barriers to discharge        Co-evaluation               End of Session Equipment Utilized During Treatment: Gait belt;Oxygen Activity Tolerance: Patient limited by fatigue Patient left: in chair;with call bell/phone within reach Nurse Communication: Mobility status;Need for lift equipment         Time: 1010-1031 PT Time Calculation (min): 21 min   Charges:   PT Evaluation $Initial PT Evaluation Tier I: 1 Procedure PT Treatments $Therapeutic Activity: 8-22 mins   PT G Codes:          INGOLD,[name] 03-20-14, 1:09 PM  [facility] Acute Rehabilitation [phone] (pager)

## 2014-03-17 NOTE — Progress Notes (Signed)
PROGRESS NOTE  Subjective:    Feels better. Remains weak. Still on milrinone 0.125. Co-ox 57%. Weight up. CVP now 12. NA 146.   Denies CP or SOB. Seen by TCTS. Not CABG candidate.   BCx 1/2 - GPC clusters  Objective:    Vital Signs:   Temp:  [97.5 F (36.4 C)-98.4 F (36.9 C)] 97.5 F (36.4 C) (09/25 0800) Pulse Rate:  [63-103] 70 (09/25 0800) Resp:  [14-29] 14 (09/25 0800) BP: (95-134)/(37-69) 134/55 mmHg (09/25 0800) SpO2:  [90 %-100 %] 100 % (09/25 0854) Weight:  [88.3 kg (194 lb 10.7 oz)] 88.3 kg (194 lb 10.7 oz) (09/25 0500)  Last BM Date: 03/06/14   24-hour weight change: Weight change: 1.3 kg (2 lb 13.9 oz)  Weight trends: Filed Weights   03/15/14 0310 03/16/14 0450 03/17/14 0500  Weight: 86.4 kg (190 lb 7.6 oz) 87 kg (191 lb 12.8 oz) 88.3 kg (194 lb 10.7 oz)    Intake/Output:  09/24 0701 - 09/25 0700 In: 2732.5 [I.V.:1849; Blood:12.5; NG/GT:471; IV Piggyback:400] Out: 795 [Urine:795] Total I/O In: 271.5 [I.V.:76.5; NG/GT:45; IV Piggyback:150] Out: 75 [Urine:75]   Physical Exam: BP 134/55  Pulse 70  Temp(Src) 97.5 F (36.4 C) (Oral)  Resp 14  Ht  (1.575 m)  Wt 88.3 kg (194 lb 10.7 oz)  BMI 35.60 kg/m2  SpO2 100%  Wt Readings from Last 3 Encounters:  03/17/14 88.3 kg (194 lb 10.7 oz)  03/17/14 88.3 kg (194 lb 10.7 oz)  03/01/14 88.905 kg (196 lb)    General: Vital signs reviewed and noted. Extubated. Chronically ill appearing  Head: Normocephalic, atraumatic.  Eyes: conjunctivae/corneas clear.  EOM's intact.   Throat: normal  Neck:  IJ in place   Lungs:  Decreased BS throughout no wheezing  Heart:  RRR, no s3 2/6 TR  Abdomen:  Soft, non-tender, non-distended    Extremities:  Warm no edema.   Neurologic: Moves all 4  Psych: A&Ox3    Labs: BMET:  Recent Labs  03/16/14 0500 03/16/14 1655 03/17/14 0400  NA 145  --  146  K 3.8  --  4.6  CL 107  --  109  CO2 30  --  28  GLUCOSE 127*  --  209*  BUN 18  --  16    CREATININE 0.36*  --  0.37*  CALCIUM 7.9*  --  8.1*  MG  --  2.0 2.1  PHOS  --  2.9 3.0    Liver function tests:  Recent Labs  03/15/14 1830  PROT 5.9*   No results found for this basename: LIPASE, AMYLASE,  in the last 72 hours  CBC:  Recent Labs  03/15/14 0430 03/15/14 1000 03/16/14 0500 03/17/14 0400  WBC 11.9* 13.1* 8.7 6.3  NEUTROABS 9.5* 11.4*  --   --   HGB 7.9* 8.1* 7.7* 9.1*  HCT 27.2* 28.4* 26.0* 29.7*  MCV 76.0* 75.9* 75.1* 74.1*  PLT 229 231 198 196    Cardiac Enzymes:  Recent Labs  03/15/14 1000  TROPONINI 0.58*    Coagulation Studies: No results found for this basename: LABPROT, INR,  in the last 72 hours  Other: No components found with this basename: POCBNP,  No results found for this basename: DDIMER,  in the last 72 hours No results found for this basename: HGBA1C,  in the last 72 hours No results found for this basename: CHOL, HDL, LDLCALC, TRIG, CHOLHDL,  in the last 72 hours No results  found for this basename: TSH, T4TOTAL, FREET3, T3FREE, THYROIDAB,  in the last 72 hours  Recent Labs  03/15/14 0430  VITAMINB12 376  FOLATE 9.3  FERRITIN 66  TIBC 226*  IRON 56  RETICCTPCT 1.5     Other results:  EKG :  NSR at 93.  Old ant. MI  Medications:    Infusions: . sodium chloride 10 mL/hr at 03/16/14 0500  . dextrose 5 % with KCl 20 mEq / L 20 mEq (03/16/14 1819)  . feeding supplement (JEVITY 1.2 CAL) 1,000 mL (03/16/14 2032)  . heparin 1,300 Units/hr (03/16/14 1746)  . milrinone 0.125 mcg/kg/min (03/16/14 0816)    Scheduled Medications: . antiseptic oral rinse  7 mL Mouth Rinse QID  . aspirin  81 mg Oral Daily  . atorvastatin  40 mg Oral q1800  . carvedilol  3.125 mg Oral BID WC  . chlorhexidine  15 mL Mouth Rinse BID  . digoxin  0.125 mg Oral Daily  . doxycycline  100 mg Oral Q12H  . escitalopram  10 mg Oral Daily  . feeding supplement (PRO-STAT SUGAR FREE 64)  30 mL Per Tube QID  . insulin aspart  0-20 Units  Subcutaneous 6 times per day  . insulin glargine  15 Units Subcutaneous Daily  . ipratropium-albuterol  3 mL Nebulization Q6H  . isosorbide mononitrate  30 mg Oral BID  . losartan  25 mg Oral Daily  . potassium chloride  40 mEq Oral BID  . predniSONE  30 mg Oral Q breakfast  . vancomycin  750 mg Intravenous Q8H    Assessment/ Plan:    1. NSTEMI with severe three-vessel coronary artery disease - not amenable to PCI 2. Acute on chronic systolic congestive heart failure with an EF of 10-15% 3. Acute respiratory failure 4. Fever/leukocytosis    --MSSA bacteremia last week 5. Diabetes mellitus 6. COPD 7. Peripheral vascular disease -status post right iliac stent, status post fem - tibioperoneal trunk. L foot ulcerations 8. Hypernatremia 9. Iron-def anemia  She is more stable but remains tenuous from respiratory and cardiac perspective. Will need to focus on aggressive management of severe CAD and LV dysfunction. Will attempt to stop milrinone today. Can stop heparin as troponin trending down and no further CP. Use DVT dosing  Will start lasix  daily. Continue to follow co-ox and CVPs. Titrate losartan to 50 daily. Continue low-dose carvedilol, digoxin, statin and Imdur.   Will need 3 weeks of cefazolin for recent MSSA bacteremia.   Over weekend would continue to titrate losartan and diuretics as needed. Keep CVP 8-10 range for now. If co-ox drops below 50% would restart milrinone.   Lorelei Heikkila,MD 9:17 AM

## 2014-03-17 NOTE — Progress Notes (Signed)
ANTIBIOTIC CONSULT NOTE - INITIAL  Pharmacy Consult for Ancef Indication: MSSA bacteremia  Allergies  Allergen Reactions  . Penicillins Hives    Tolerates cefepime  . Adhesive [Tape] Rash    Patient Measurements: Height:  (157.5 cm) Weight: 194 lb 10.7 oz (88.3 kg) IBW/kg (Calculated) : 50.1 Vital Signs: Temp: 98 F (36.7 C) (09/25 1200) Temp src: Oral (09/25 0800) BP: 123/54 mmHg (09/25 1400) Pulse Rate: 64 (09/25 1400) Intake/Output from previous day: 09/24 0701 - 09/25 0700 In: 2732.5 [I.V.:1849; Blood:12.5; NG/GT:471; IV Piggyback:400] Out: 795 [Urine:795] Intake/Output from this shift: Total I/O In: 488 [I.V.:203; NG/GT:135; IV Piggyback:150] Out: 195 [Urine:195]  Labs:  Recent Labs  03/15/14 0430 03/15/14 1000 03/16/14 0500 03/17/14 0400  WBC 11.9* 13.1* 8.7 6.3  HGB 7.9* 8.1* 7.7* 9.1*  PLT 229 231 198 196  CREATININE 0.42*  --  0.36* 0.37*   Estimated Creatinine Clearance: 81.1 ml/min (by C-G formula based on Cr of 0.37).  Recent Labs  03/16/14 2130  VANCOTROUGH 22.3*     Microbiology: 9/15 blood cx2 - NEG 9/13 blood cx2 - MSSA 9/13 urine - NEG 9/13 wound cx - pseudomonas - pan sensitive  9/22 BAL - NEG 9/22 Blood - 1/2 coagulase negative staph  Medications:  9/13 cefepime >> 9/21; 9/22 >>9/24 9/21 Ancef >> 9/21; 9/25 >> 9/13 vanc >> 9/17; 9/22 >>9/25 9/24 VT 22.3 on 1g IV q8h 9/15 ancef > ordered not given 9/24 Doxy >>   Assessment: [name] being narrowed back to IV Ancef for MSSA bacteremia with plan of >3 weeks per ID. WBC within normal limits. Afebrile. SCr 0.36, estimated CrCl~ 80-42mL/min.   Goal of Therapy:  Clinical resolution of infection  Plan:  1. Ancef 2g IV q8h. 2. Monitor renal function and adjust dosing as needed.   Link Snuffer, PharmD, BCPS Clinical Pharmacist [phone] 03/17/2014,3:16 PM

## 2014-03-17 NOTE — Progress Notes (Signed)
Rehab Admissions Coordinator Note:  Patient was screened by Eron Staat L for appropriateness for an Inpatient Acute Rehab Consult.  At this time, we are recommending Inpatient Rehab consult.  Salam Micucci L 03/17/2014, 2:36 PM  I can be reached at [phone].

## 2014-03-17 NOTE — Progress Notes (Signed)
Speech Language Pathology Treatment: Dysphagia  Patient Details Name: JAMYE BALICKI MRN: [id number] DOB: January 05, 1956 Today's Date: 03/17/2014 Time: 4098-1191 SLP Time Calculation (min): 10 min  Assessment / Plan / Recommendation Clinical Impression  F/u after 9/25 FEES.  Pt presents with persisting s/s of dysphagia with impaired airway protection after PO trials. Min-mod I verbal cues.   Voice remains aphonic. Reviewed results/recs after FEES; intubation and impact on vocal cord function; prognosis for good swallow recovery. Pt verbalizes understanding.  Recommend continued NPO; ice chips after oral care.  SLP to f/u next date for improvements/ readiness for oral diet.    HPI HPI: 58 year old female with h/o COPD, DM, peripheral vascular disease, admitted worsening left lower extremity pain and  and abdominal pain, developed acute hypoxic respiratory failure due to pulmonary edema requiring intubation, NSTEMI, MSSA bacteremia after recent angiogram. Initial CXR 9/13 clear however CXR 9/14 notes right basilar airspace opacity may reflect pneumonia.    Pertinent Vitals Pain Assessment: No/denies pain  SLP Plan  Continue with current plan of care    Recommendations Diet recommendations: NPO (allow ice chips after oral care) Medication Administration: Via alternative means              Plan: Continue with current plan of care   Eduar Kumpf L. Samson Frederic, [place] CCC/SLP Pager [phone]      Blenda Mounts Laurice 03/17/2014, 11:25 AM

## 2014-03-17 NOTE — Progress Notes (Signed)
PULMONARY / CRITICAL CARE MEDICINE     Name: [name] MRN:  [id number] DOB:   17-May-1956           ADMISSION DATE:  03/05/2014 CONSULTATION DATE:  9/14   REFERRING MD :  [facility]   CHIEF COMPLAINT: Left leg pain   INITIAL PRESENTATION: Left leg pain, hypotension and hypoxic.   STUDIES:   9/14  Echo- EF 30-35%     SIGNIFICANT EVENTS: 9/14 hypoxia, hypotension . ++ Trop I 9/14 intubated 9/15 pos BC MSSA   9/16 TEE>>>   SUBJECTIVE: remains on vent   VITAL SIGNS: Temp:  [97 F (36.1 C)-98.4 F (36.9 C)] 98.3 F (36.8 C) (09/16 0800) Pulse Rate:  [66-110] 91 (09/16 0800) Resp:  [9-26] 14 (09/16 0800) BP: (92-130)/(48-90) 98/48 mmHg (09/16 0843) SpO2:  [96 %-100 %] 98 % (09/16 0800) FiO2 (%):  [40 %] 40 % (09/16 0920) Weight:  [225 lb 1.4 oz (102.1 kg)] 225 lb 1.4 oz (102.1 kg) (09/16 0500) HEMODYNAMICS: CVP:  [8 mmHg-18 mmHg] 15 mmHg VENTILATOR SETTINGS: Vent Mode:  [-] PRVC FiO2 (%):  [40 %] 40 % Set Rate:  [14 bmp-20 bmp] 14 bmp Vt Set:  [500 mL] 500 mL PEEP:  [5 cmH20] 5 cmH20 Pressure Support:  [5 cmH20-8 cmH20] 8 cmH20 Plateau Pressure:  [17 cmH20-21 cmH20] 18 cmH20 INTAKE / OUTPUT: Intake/Output Summary (Last 24 hours) at 03/08/14 0931 Last data filed at 03/08/14 0600   Gross per 24 hour   Intake  2024.75 ml   Output    1700 ml   Net  324.75 ml      PHYSICAL EXAMINATION: General:  Obese female on vent, NAD Neuro:  rass -1, nonfocal   HEENT:  JVD Cardiovascular: s1 s2 RRR Lungs:  ronchi no wheeze, SBT Abdomen:  nonTender, large ecchymotic area left femoral area and into abd. Hypo BS Musculoskeletal:  Lower ext PVD changes with multiple healing ulcers. Pulses palp,   Skin: Ulcers as noted, dry, R foot cool to touch, L warm and edematous   LABS:   CBC Recent Labs Lab  03/06/14 1800  03/07/14 0500  03/08/14 0501   WBC  7.8  17.2*  15.2*   HGB  8.9*  9.1*  8.9*   HCT  28.5*  29.7*  28.3*   PLT  180  334  284    Coag's Recent  Labs Lab  03/06/14 1300   INR  1.45    BMET Recent Labs Lab  03/05/14 1650  03/06/14 0415  03/08/14 0501   NA  130*  132*  134*   K  3.8  3.4*  3.9   CL  92*  99  100   CO2  BUN  CREATININE  0.60  0.47*  0.41*   GLUCOSE  175*  175*  220*    Electrolytes Recent Labs Lab  03/05/14 1650  03/06/14 0415    03/07/14 0500  03/07/14 1200  03/07/14 1839  03/08/14 0501   CALCIUM  8.8  7.2*   --    --    --    --   7.9*   MG   --    --    < >  1.8  2.4  2.2  2.2   PHOS   --    --    --   1.4*   --   2.5  1.7*    < > =  values in this interval not displayed. Sepsis Markers Recent Labs Lab  03/05/14 2308  03/06/14 0415    03/06/14 1400  03/06/14 1938  03/07/14 0500  03/08/14 0501   LATICACIDVEN  1.5  0.8   --   1.1   --    --    --    PROCALCITON   --    --    < >   --   1.59  1.34  0.79    < > = values in this interval not displayed. ABG Recent Labs Lab  03/06/14 1646  03/07/14 0936  03/08/14 0500   PHART  7.300*  7.313*  7.346*   PCO2ART  41.5  40.3  40.2   PO2ART  80.0  76.0*  114.0*    Liver Enzymes Recent Labs Lab  03/05/14 1650  03/06/14 0415  03/08/14 0501   AST  31  41*  41*   ALT  11  15  50*   ALKPHOS  78  64  72   BILITOT  0.7  0.6  0.3   ALBUMIN  3.0*  2.4*  2.2*    Cardiac Enzymes Recent Labs Lab  03/06/14 0415  03/06/14 1015  03/06/14 1718   TROPONINI  6.05*  10.57*  16.99*   PROBNP   --   8842.0*   --     Glucose Recent Labs Lab  03/07/14 1147  03/07/14 1654  03/07/14 2027  03/07/14 2350  03/08/14 0407  03/08/14 0807   GLUCAP  203*  234*  187*  205*  221*  202*      Imaging Portable Chest Xray In Am   03/07/2014   CLINICAL DATA:  Hypoxia  EXAM: PORTABLE CHEST - 1 VIEW  COMPARISON:  March 06, 2014  FINDINGS: Endotracheal tube tip is 2.5 cm above the carina. Central catheter tip is at the junction of the left innominate vein and superior vena cava. Nasogastric tube tip and side port are below the  diaphragm. No pneumothorax. There is interstitial edema with cardiomegaly and pulmonary venous hypertension. There is a right effusion. There is a minimal left effusion as well. There is mild airspace consolidation in the left base. No adenopathy.  IMPRESSION: Tube and catheter positions as described without pneumothorax. Evidence of a degree of congestive heart failure. Question alveolar edema versus superimposed pneumonia left base.   Electronically Signed   By: Bretta Bang M.D.   On: 03/07/2014 07:23       ASSESSMENT / PLAN:   PULMONARY OETT>>9/14 A: Hypoxia - pulm edema Tobacco abuse HCAP (evolving infiltrate RLL) AE COPD? > Emphysema on CT scan, no prior diagnosis of COPD P:    BD scheduled and PRN Solumedrol reduced to 40 mg/mL - reduce rapid pcxr in am abg reviewed, keep same MV SBT initiated 9/16 Requires neg balance, lasix   CARDIOVASCULAR CVL>>9/14 A:   NSTEMI>+ trop I 6.5 9/14 incidental finding. Septic shock- resolving R/o endocarditis P:   Cards is managing. Hx of CAD + stent 15 years ago Heparin per Cards Plan for cath when stable Neo to map 60 (61 9/16) - improved Check cortisol level - no evidence ai (cortisol 63.3 9/14), will dc steroids as not needed for lungs  Assess cvp (9/16 15) - lasix     RENAL A:    Hypo K- resolved hypo mag hypo phos P:    Replace phos now iv Saline maintain - kvo cvp assessment -dc Lasix start to goal neg  2 liters, she is off pressors    GASTROINTESTINAL A:    feeds P:    pepcid Start feeds after tee   HEMATOLOGIC A:    Chronic anemia L inguinal hematoma Some hemoconcetartion P:   transfuse per protocol for hgb <7.0 Monitor left groin closely Cbc in am Hep infusion   INFECTIOUS A:  Bacteremia, related to recent angio? Vascular in nature Septic shock -improving R/o HCAP   P:    BCx2  9/14>>2/2 Staph aureus>>> UC  9/14>>negative Sputum Abx: Vanc  , start date 9/14>>> Abx: cefepime, start  date 9/14>>> Ancef 9/15>>>9/15 Consider TEE, will d/w cards / ID consult Repeat BC   Per ID, consider Ancef     ENDOCRINE A:    DM last Hbg a1c  7.2 NICE goal 140-180 P:    To res SSI lantus likely after tf started     NEUROLOGIC A:   Vent dyschrony P:    fent rass goal -1   TODAY'S SUMMARY: Bacteremia, needs tee, weaning, lasix , edema  I have personally obtained a history, examined the patient, evaluated laboratory and imaging results, formulated the assessment and plan and placed orders.  CRITICAL CARE: The patient is critically ill with multiple organ systems failure and requires high complexity decision making for assessment and support, frequent evaluation and titration of therapies, application of advanced monitoring technologies and extensive interpretation of multiple databases. Critical Care Time devoted to patient care services described in this note is 30 minutes.   Mcarthur Rossetti. Tyson Alias, MD, FACP Pgr: [phone] [facility]

## 2014-03-18 LAB — GLUCOSE, CAPILLARY
GLUCOSE-CAPILLARY: 197 mg/dL — AB (ref 70–99)
Glucose-Capillary: 168 mg/dL — ABNORMAL HIGH (ref 70–99)
Glucose-Capillary: 204 mg/dL — ABNORMAL HIGH (ref 70–99)
Glucose-Capillary: 206 mg/dL — ABNORMAL HIGH (ref 70–99)
Glucose-Capillary: 287 mg/dL — ABNORMAL HIGH (ref 70–99)
Glucose-Capillary: 287 mg/dL — ABNORMAL HIGH (ref 70–99)

## 2014-03-18 LAB — BASIC METABOLIC PANEL
ANION GAP: 8 (ref 5–15)
BUN: 17 mg/dL (ref 6–23)
CALCIUM: 8 mg/dL — AB (ref 8.4–10.5)
CO2: 29 mEq/L (ref 19–32)
CREATININE: 0.37 mg/dL — AB (ref 0.50–1.10)
Chloride: 104 mEq/L (ref 96–112)
GFR calc non Af Amer: >90 mL/min (ref >90)
Glucose, Bld: 207 mg/dL — ABNORMAL HIGH (ref 70–99)
Potassium: 3.9 mEq/L (ref 3.7–5.3)
Sodium: 141 mEq/L (ref 137–147)

## 2014-03-18 LAB — MAGNESIUM
Magnesium: 2 mg/dL (ref 1.5–2.5)
Magnesium: 2.1 mg/dL (ref 1.5–2.5)

## 2014-03-18 LAB — PHOSPHORUS
Phosphorus: 4.3 mg/dL (ref 2.3–4.6)
Phosphorus: 3.6 mg/dL (ref 2.3–4.6)

## 2014-03-18 LAB — CARBOXYHEMOGLOBIN
CARBOXYHEMOGLOBIN: 1.6 % — AB (ref 0.5–1.5)
METHEMOGLOBIN: 0.6 % (ref 0.0–1.5)
O2 Saturation: 61.7 %
TOTAL HEMOGLOBIN: 9.4 g/dL — AB (ref 12.0–16.0)

## 2014-03-18 MED ORDER — FUROSEMIDE 40 MG PO TABS
40.0000 mg | ORAL_TABLET | Freq: Two times a day (BID) | ORAL | Status: DC
  Administered 2014-03-18 – 2014-03-20 (×4): 40 mg via ORAL
  Filled 2014-03-18 (×6): qty 1

## 2014-03-18 MED ORDER — PREDNISONE 20 MG PO TABS
20.0000 mg | ORAL_TABLET | Freq: Every day | ORAL | Status: DC
  Administered 2014-03-19 – 2014-03-20 (×2): 20 mg via ORAL
  Filled 2014-03-18 (×3): qty 1

## 2014-03-18 NOTE — Progress Notes (Addendum)
[facility] PROGRESS NOTE  [name]:[id number] DOB: 1955/08/07 DOA: 03/05/2014 PCP: Kirk Ruths, MD  Assessment/Plan: Principal Problem:   Sepsis Active Problems:   Left leg pain   Anemia   Diabetes mellitus, controlled   NSTEMI (non-ST elevated myocardial infarction)   Acute respiratory failure   Acute systolic heart failure    Transfer to [facility] service SDU 9/26   OETT>>9/14>>9/19 ext; reintubated 9/21 > extubated 9/23   Acute hypoxic resp failure due to ischemia and flash edema  Bilateral effusions L >R > transudate, only 500cc removed 9/23 from left  Tobacco abuse  COPD, with improving exacerbation Continue incentive spirometry  Wean prednisone  Flutter valve  OOB  O2 as needed for SpO2 >92%  BD scheduled and PRN   CVL>>9/14>>9/20  CVL #2 >> 9/21 >>  Severe three vessel CAD > not a CABG candidate  Ischemic cardiomyopathy  Septic shock> resolved  NSTEMI with severe three-vessel coronary artery disease - not amenable to PCI  Acute on chronic systolic congestive heart failure with an EF of 10-15% Appreciate heart failure service  Continue milrinone  Continue Cozaar, Coreg, ASA , statin per cardiology  Continue heparin for DVT prophylaxis Lasix today   Hypophos/hypokalemia  Keep K > 4.0, follow magnesium  Check BMET/mg/phos in AM    Chronic anemia  L inguinal hematoma without evidence of bleeding  Transfuse per protocol for hgb <8.0 given ischemia  Monitor left groin closely   MSSA Bacteremia after recent angiogram, source uncertain, TEE negative, cultures now clear  Septic shock resolved  HCAP 9/22 > doubt as quantitative culture negative from BAL  BCx2 9/14>>MSSA  UC 9/14>>negative  WC 9/13>>pseudomonas  BC x2 9/15>>neg  BC x2 9/22>> 1/2 coag neg staph  BAL quant culture 9/22 >> 10K cfu OPF  Abx: Vanc , start date 9/14>>>9/17off; 9/21back on>>9/25 off  Abx: cefepime, start date 9/14 (HCAP?)>>>9/21  Ancef 9/15>>>9/21; resumed  9/25  P:  D/C vanc  Resume cefazolin, plan 3 weeks total per last ID note  Change antibiotics to Levaquin for AE COPD > plan 7 days total antibiotics (stop 9/27)   DM last Hbg a1c 7.2  Continue lantus at 20  Continue SSI  Wean steroids this week    Code Status: full Family Communication: family updated about patient's clinical progress Disposition Plan:  Transferred to step down   Brief narrative: 58 year old Caucasian female diabetic smoker with peripheral vascular disease was admitted to the hospital with fever, leukocytosis and leg pain and a groin hematoma from her recent arteriogram of the lower extremities. She went on to develop positive blood cultures and also Pseudomonas was cultured from her groin wound. She subsequently developed probable aspiration pneumonia hypertension then elevated cardiac enzymes. She has severe LV dysfunction on her echocardiogram as well as ventriculogram with multivessel CAD--the the vessels are poor targets for grafting. The patient was intubated for 10 days and just recently extubated. She has evidence of persistent pleural effusions from heart failure, she has not been out of bed yet, she has not resumed a normal diet and she is still quite hoarse. A recent blood culture was positive one out of 2 samples. With her sepsis, heart failure, ischemic cardiomyopathy with poor targets, and general deconditioning, malnutrition, and peripheral vascular disease she would not be candidate for CABG. She has had her saphenous vein harvested from her right leg for femoropopliteal bypass and the left leg has evidence of severe venous stasis and probably poor quality conduit.   STUDIES:  9/14 Echo- EF 30-35%  9/16 TEE- no LAA thrombus, no valvular vegetation, anterior, anteroseptal, apical, and inferioapical hypokinesis, EF 35%  9/17 EKG- sinus tach, low voltage QRS, cannot r/o anteroseptal infarct  9/18 LHC- 40% distal left main, severe diffuse disease from LAD, 95%  stenosis circ, focal 75% mid RCA, LVEF 15%  9/21 L groin vascular ultrasound >>> no evidence of complication around graft site  SIGNIFICANT EVENTS:  9/14 hypoxia, hypotension . ++ Trop I  9/14 intubated  9/15 pos BC MSSA  9/16 TEE>>> see above  9/17- neg 2 liters, still bronchospastic  9/21 acute hypoxemic respiratory failure, re-intubated, cardiology started milrninone  9/23 thora left side 500c removed, Extubated  9/24 failed swallow evaluation; TCTS consult > not a CABG candidate, rec medical management, transfused 1 U PRBC  SUBJECTIVE: failed swallow eval, reports feeling much better   HPI/Subjective: Pleasant, comfortable, no complaints  Objective: Filed Vitals:   03/18/14 0800 03/18/14 0805 03/18/14 0830 03/18/14 0900  BP:  125/60    Pulse: 90 79 77 61  Temp:      TempSrc:      Resp: Height:      Weight:      SpO2: 96% 95% 94% 94%    Intake/Output Summary (Last 24 hours) at 03/18/14 0958 Last data filed at 03/18/14 0900  Gross per 24 hour  Intake   2355 ml  Output    720 ml  Net   1635 ml    Exam:  Gen: awake, alert  HEENT: NCAT, EOMi  PULM: CTA B today  CV: Tachy, regular  Ab: BS+, soft, nontender  Ext: cool, cap refill intact  Derm: R ankle wound dressed, ecchymosis LLQ abdomen  Neuro: awake and alert       Data Reviewed: Basic Metabolic Panel:  Recent Labs Lab 03/14/14 0500 03/15/14 0430 03/16/14 0500 03/16/14 1655 03/17/14 0400 03/17/14 1505 03/18/14 0355  NA 151* 149* 145  --  146  --  141  K 3.8 3.9 3.8  --  4.6  --  3.9  CL 105 108 107  --  109  --  104  CO2 38* 34* 30  --  28  --  29  GLUCOSE 230* 160* 127*  --  209*  --  207*  BUN 31* 25* 18  --  16  --  17  CREATININE 0.46* 0.42* 0.36*  --  0.37*  --  0.37*  CALCIUM 8.8 7.8* 7.9*  --  8.1*  --  8.0*  MG 2.2  --   --  2.0 2.1 2.0 2.0  PHOS 2.3  --   --  2.9 3.0 3.0 4.3    Liver Function Tests:  Recent Labs Lab 03/15/14 1830  PROT 5.9*   No results found  for this basename: LIPASE, AMYLASE,  in the last 168 hours No results found for this basename: AMMONIA,  in the last 168 hours  CBC:  Recent Labs Lab 03/14/14 0500 03/15/14 0430 03/15/14 1000 03/16/14 0500 03/17/14 0400  WBC 11.1* 11.9* 13.1* 8.7 6.3  NEUTROABS  --  9.5* 11.4*  --   --   HGB 9.0* 7.9* 8.1* 7.7* 9.1*  HCT 31.7* 27.2* 28.4* 26.0* 29.7*  MCV 75.1* 76.0* 75.9* 75.1* 74.1*  PLT 327 229 231 198 196    Cardiac Enzymes:  Recent Labs Lab 03/13/14 1142 03/13/14 1842 03/15/14 1000  TROPONINI 1.39* 1.78* 0.58*   BNP (last 3 results)  Recent Labs  03/06/14 1015  PROBNP 8842.0*     CBG:  Recent Labs Lab 03/17/14 1536 03/17/14 2004 03/18/14 0022 03/18/14 0347 03/18/14 0745  GLUCAP 253* 217* 168* 204* 197*    Recent Results (from the past 240 hour(s))  CULTURE, BLOOD (ROUTINE X 2)     Status: None   Collection Time    03/14/14 11:06 AM      Result Value Ref Range Status   Specimen Description BLOOD LEFT HAND   Final   Special Requests BOTTLES DRAWN AEROBIC AND ANAEROBIC 10CC   Final   Culture  Setup Time     Final   Value: 03/14/2014 16:45     Performed at [facility]   Culture     Final   Value: STAPHYLOCOCCUS SPECIES (COAGULASE NEGATIVE)     Note: THE SIGNIFICANCE OF ISOLATING THIS ORGANISM FROM A SINGLE SET OF BLOOD CULTURES WHEN MULTIPLE SETS ARE DRAWN IS UNCERTAIN. PLEASE NOTIFY THE MICROBIOLOGY DEPARTMENT WITHIN ONE WEEK IF SPECIATION AND SENSITIVITIES ARE REQUIRED.     Note: Gram Stain Report Called to,Read Back By and Verified With: MONICA CROSS TAKEN FOR NURSE 12:55AM 03/16/14 THOMI     Performed at [facility]   Report Status 03/17/2014 FINAL   Final  CULTURE, BLOOD (ROUTINE X 2)     Status: None   Collection Time    03/14/14 11:16 AM      Result Value Ref Range Status   Specimen Description BLOOD RIGHT HAND   Final   Special Requests BOTTLES DRAWN AEROBIC AND ANAEROBIC 10CC   Final   Culture  Setup Time     Final    Value: 03/14/2014 16:43     Performed at [facility]   Culture     Final   Value:        BLOOD CULTURE RECEIVED NO GROWTH TO DATE CULTURE WILL BE HELD FOR 5 DAYS BEFORE ISSUING A FINAL NEGATIVE REPORT     Performed at [facility]   Report Status PENDING   Incomplete  CULTURE, BAL-QUANTITATIVE     Status: None   Collection Time    03/14/14 12:47 PM      Result Value Ref Range Status   Specimen Description BRONCHIAL ALVEOLAR LAVAGE   Final   Special Requests Normal   Final   Gram Stain     Final   Value: FEW WBC PRESENT,BOTH PMN AND MONONUCLEAR     RARE SQUAMOUS EPITHELIAL CELLS PRESENT     NO ORGANISMS SEEN     Performed at [facility] Count     Final   Value: 10,000 COLONIES/ML     Performed at [facility]   Culture     Final   Value: Non-Pathogenic Oropharyngeal-type Flora Isolated.     Performed at [facility]   Report Status 03/16/2014 FINAL   Final  AFB CULTURE WITH SMEAR     Status: None   Collection Time    03/15/14  2:33 PM      Result Value Ref Range Status   Specimen Description FLUID LEFT PLEURAL   Final   Special Requests NONE   Final   Acid Fast Smear     Final   Value: NO ACID FAST BACILLI SEEN     Performed at [facility]   Culture     Final   Value: CULTURE WILL BE EXAMINED FOR 6 WEEKS BEFORE ISSUING A FINAL REPORT  Performed at [facility]   Report Status PENDING   Incomplete  BODY FLUID CULTURE     Status: None   Collection Time    03/15/14  2:33 PM      Result Value Ref Range Status   Specimen Description FLUID LEFT PLEURAL   Final   Special Requests NONE   Final   Gram Stain     Final   Value: RARE WBC PRESENT, PREDOMINANTLY MONONUCLEAR     NO ORGANISMS SEEN     Performed at [facility]   Culture     Final   Value: NO GROWTH 1 DAY     Performed at [facility]   Report Status PENDING   Incomplete  FUNGUS CULTURE W SMEAR     Status: None   Collection Time     03/15/14  2:33 PM      Result Value Ref Range Status   Specimen Description PLEURAL LEFT   Final   Special Requests NONE   Final   Fungal Smear     Final   Value: NO YEAST OR FUNGAL ELEMENTS SEEN     Performed at [facility]   Culture     Final   Value: CULTURE IN PROGRESS FOR FOUR WEEKS     Performed at [facility]   Report Status PENDING   Incomplete     Studies: Dg Tibia/fibula Left  03/05/2014   CLINICAL DATA:  Lower leg pain.  EXAM: LEFT TIBIA AND FIBULA - 2 VIEW  COMPARISON:  Screws in the distal tibia and side plate and screws in the distal fibula. The fractures have healed. 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 No acute osseous abnormality. Extensive vascular calcifications and dermal calcifications in the lower leg.  FINDINGS: No acute abnormalities. Arthritis at the knee and ankle. Postsurgical changes.  IMPRESSION: Negative.   Electronically Signed   By: Geanie Cooley M.D.   On: 03/05/2014 22:10   Ct Abdomen Pelvis W Contrast  03/06/2014   CLINICAL DATA:  Left flank pain began after recent vascular catheterization. Pain in the entire left leg. No fever, chills, nausea, vomiting.  EXAM: CT ABDOMEN AND PELVIS WITH CONTRAST  TECHNIQUE: Multidetector CT imaging of the abdomen and pelvis was performed using the standard protocol following bolus administration of intravenous contrast.  CONTRAST:  OMNIPAQUE IOHEXOL 300 MG/ML  SOLN  COMPARISON:  07/25/2013  FINDINGS: Lower chest: Dependent changes are seen in the lung bases. Scattered emphysematous changes are also present. There is atherosclerotic calcification of the coronary vessels. Heart size is normal. No pericardial effusion.  Upper abdomen: Scattered calcified granulomata are identified within the liver. No focal abnormality identified within the spleen, pancreas, or left adrenal gland. There is a low-attenuation lesion  adjacent to or involving the right adrenal gland, consistent with a benign process. The patient has had previous cholecystectomy. Bilateral renal cysts are noted.  Bowel: Stomach has a normal appearance. A midline abdominal hernia contains mesenteric fat and nondilated loops of small bowel and appears stable over prior studies. Colonic loops are normal in appearance. There is moderate stool burden. The appendix has a normal appearance.  Pelvis: There is moderate stranding in the subcutaneous tissues and abdominal wall of the left inguinal region. Findings are consistent with edema/ hematoma after recent catheterization. Age no evidence for intraperitoneal hematoma. The uterus is present. No adnexal mass. The urinary  bladder is distended.  Retroperitoneum: No retroperitoneal or mesenteric adenopathy. There is dense atherosclerotic calcification of the abdominal aorta. No aneurysm. Right iliac stent.  Osseous structures: Degenerative changes in the lower thoracic and upper lumbar spine. There is 4 mm of anterolisthesis of L3 on L4.  IMPRESSION: 1. Left inguinal hematoma. This does not extend into the peritoneal cavity. 2. Midline abdominal hernia contains mesenteric fat and nondilated loops of small bowel. 3. Distended urinary bladder. 4. Normal appendix. 5. Scattered emphysematous changes. 6. Coronary artery calcifications. 7. Calcified granulomata in the liver. 8. Benign right adrenal gland lesion.   Electronically Signed   By: Rosalie Gums M.D.   On: 03/06/2014 01:07   [place] Extrem Low Right Ltd  03/13/2014   CLINICAL DATA:  Evaluation of bypass graft.  EXAM: ULTRASOUND right LOWER EXTREMITY LIMITED  TECHNIQUE: Ultrasound examination of the lower extremity soft tissues was performed in the area of clinical concern.  COMPARISON:  None.  FINDINGS: No focal fluid collections are identified around the graft in the right lower extremity. Patent arterial blood flow.  IMPRESSION: Negative ultrasound examination for  complicating features associated with the right lower extremity bypass graft.   Electronically Signed   By: Loralie Champagne M.D.   On: 03/13/2014 09:29   Dg Chest Port 1 View  03/17/2014   CLINICAL DATA:  Hypoxia.  EXAM: PORTABLE CHEST - 1 VIEW  COMPARISON:  Single view of the chest 03/16/2014 and 03/15/2014.  FINDINGS: Right IJ catheter and feeding tube remain in place. Right greater than left pleural effusions and basilar atelectasis persist without marked change since yesterday's examination. Heart size is normal. No pneumothorax.  IMPRESSION: No marked change in right greater than left effusions and airspace disease since yesterday's examination.   Electronically Signed   By: Drusilla Kanner M.D.   On: 03/17/2014 07:08   Dg Chest Port 1 View  03/16/2014   CLINICAL DATA:  Shortness of breath  EXAM: PORTABLE CHEST - 1 VIEW  COMPARISON:  03/15/2014  FINDINGS: Cardiac shadow remains mildly enlarged. Bilateral pleural effusions are noted and mildly increased from the prior exam. Increasing right basilar atelectasis is noted. A right jugular central line is seen in the SVC without evidence of pneumothorax. The endotracheal tube and nasogastric catheter been removed in the interval.  IMPRESSION: Mild increase in bilateral pleural effusions with increase in right basilar atelectasis.   Electronically Signed   By: Alcide Clever M.D.   On: 03/16/2014 07:12   Dg Chest Port 1 View  03/15/2014   CLINICAL DATA:  Status post thoracentesis  EXAM: PORTABLE CHEST - 1 VIEW  COMPARISON:  Study obtained earlier in the day  FINDINGS: There is no appreciable pneumothorax. Left effusion is no longer appreciable. There is a persistent right effusion, stable.  Endotracheal tube tip is 3.8 cm above the carina. Central catheter tip is in the superior vena cava. Nasogastric tube tip and side port are below the diaphragm. Heart is upper normal in size with pulmonary vascularity within normal limits. No adenopathy.  IMPRESSION: Tube  and catheter positions as described. Left effusion resolved. Right effusion persists. No pneumothorax. No change in cardiac silhouette.   Electronically Signed   By: Bretta Bang M.D.   On: 03/15/2014 15:14   Dg Chest Port 1 View  03/15/2014   CLINICAL DATA:  Assess endotracheal tube  EXAM: PORTABLE CHEST - 1 VIEW  COMPARISON:  03/14/2014  FINDINGS: Cardiomediastinal silhouette is stable. Endotracheal tube in place with tip 1.9 cm  above the carina. Stable right IJ central line position. Stable NG tube position. Persistent mild congestion/ edema. Again noted small bilateral pleural effusion with bilateral basilar atelectasis or infiltrate.  IMPRESSION: Stable support apparatus. Persistent mild congestion/ edema. Again noted small bilateral pleural effusion with bilateral basilar atelectasis or infiltrate.   Electronically Signed   By: Natasha Mead M.D.   On: 03/15/2014 07:47   Dg Chest Port 1 View  03/14/2014   CLINICAL DATA:  Intubation.  EXAM: PORTABLE CHEST - 1 VIEW  COMPARISON:  02/22/2014.  FINDINGS: Endotracheal tube, NG tube, and right IJ line in stable position. Cardiomegaly with pulmonary alveolar infiltrates bilaterally. Bilateral pleural effusions. These findings are consistent with congestive heart failure. Pulmonary edema has increased from prior exam. No pneumothorax. No acute osseus abnormality. Carotid vascular calcification.  IMPRESSION: 1. Line and tubes are in good anatomic position. 2. Progressive pulmonary edema with small bilateral pleural effusions. 3. Carotid vascular disease.   Electronically Signed   By: Maisie Fus  Register   On: 03/14/2014 07:35   Dg Chest Port 1 View  03/13/2014   CLINICAL DATA:  Central line insertion.  Pulmonary edema.  EXAM: PORTABLE CHEST - 1 VIEW 2:21 p.m.  COMPARISON:  03/13/2014 at 8:45 a.m. and 03/12/2014  FINDINGS: Right jugular vein catheter is been inserted and the tip is in the superior vena cava at the level of the carina in good position.  Endotracheal tube the is 3 cm above the carina. NG tube has been advanced and is now in the stomach.  Heart size and pulmonary vascularity are normal. The interstitial edema pattern has improved with slight residual haziness at the lung bases. There is new increased density at both bases consistent with posteriorly layered effusions.  No acute osseous abnormality.  IMPRESSION: New central line in good position.  NG tube now below the diaphragm.  New bilateral effusions.  Improved interstitial edema.   Electronically Signed   By: Geanie Cooley M.D.   On: 03/13/2014 14:36   Dg Chest Port 1 View  03/13/2014   CLINICAL DATA:  Intubation.  EXAM: PORTABLE CHEST - 1 VIEW  COMPARISON:  03/12/2014.  FINDINGS: Endotracheal tube noted 3 cm above the carina. NGT noted below the hemidiaphragm. Cardiomegaly with pulmonary vascular prominence and interstitial prominence consistent with congestive heart failure and interstitial edema. Bilateral pneumonitis cannot be excluded. No acute bony abnormality.  IMPRESSION: 1. Endotracheal tube noted 3 cm above the carina. NG tube in good anatomic position. 2. Congestive heart failure with pulmonary interstitial edema. Pulmonary interstitial pneumonitis cannot be excluded .   Electronically Signed   By: Maisie Fus  Register   On: 03/13/2014 09:03   Dg Chest Port 1 View  03/12/2014   CLINICAL DATA:  Follow-up pulmonary edema and effusions.  EXAM: PORTABLE CHEST - 1 VIEW  COMPARISON:  Portable chest x-rays yesterday dating back to 03/07/2014.  FINDINGS: Cardiac silhouette mildly enlarged but stable. Interval improvement in the interstitial and airspace pulmonary edema, though mild edema persists. Stable bilateral pleural effusions, right greater than left and dense consolidation in the lower lobes. No new pulmonary parenchymal abnormalities. Left jugular central venous catheter tip projects over the upper SVC, unchanged.  IMPRESSION: Support apparatus satisfactory. Improved CHF, with mild  interstitial pulmonary edema persisting. Stable bilateral pleural effusions, right greater than left and associated dense passive atelectasis in the lower lobes. No new abnormalities.   Electronically Signed   By: Hulan Saas M.D.   On: 03/12/2014 08:15   Dg Chest [facility]  1 View  03/11/2014   CLINICAL DATA:  Pneumonitis.  EXAM: PORTABLE CHEST - 1 VIEW  COMPARISON:  03/10/2014  FINDINGS: Endotracheal and enteric tubes have been removed. Left jugular central venous catheter tip overlies the upper SVC. The cardiac silhouette remains enlarged. Pulmonary vascular congestion persists with similar appearance of bilateral airspace opacities and likely small bilateral pleural effusions. No pneumothorax.  IMPRESSION: No significant interval change in the appearance of the lungs, most suggestive of pulmonary edema and small bilateral pleural effusions.   Electronically Signed   By: Sebastian Ache   On: 03/11/2014 13:25   Dg Chest Portable 1 View  03/10/2014   CLINICAL DATA:  Evaluate airspace disease and endotracheal tube position  EXAM: PORTABLE CHEST - 1 VIEW  COMPARISON:  Portable chest x-ray of 03/09/2014  FINDINGS: There is little change in diffuse airspace disease most consistent with edema. Cardiomegaly and probable small effusions remain. The endotracheal tube tip is approximately 4.9 cm above the carina. NG tube extends below the hemidiaphragm.  IMPRESSION: Little change in poor aeration in diffuse airspace disease most consistent with edema with probable effusions.   Electronically Signed   By: Dwyane Dee M.D.   On: 03/10/2014 07:51   Dg Chest Port 1 View  03/09/2014   CLINICAL DATA:  Respiratory distress.  EXAM: PORTABLE CHEST - 1 VIEW  COMPARISON:  03/09/2014  FINDINGS: Endotracheal tube is in place with tip approximately 3 cm above the carina. Left IJ central line tip overlies the level of the superior vena cava. Nasogastric tube is in place with tip off the film but beyond the gastroesophageal junction.   The heart is enlarged. There are central airspace filling opacity similar in appearance to the prior study.  IMPRESSION: Persistent bilateral airspace filled opacities.   Electronically Signed   By: Rosalie Gums M.D.   On: 03/09/2014 19:59   Dg Chest Port 1 View  03/09/2014   CLINICAL DATA:  Pulmonary edema.  EXAM: PORTABLE CHEST - 1 VIEW  COMPARISON:  Same day.  FINDINGS: Stable mild cardiomegaly. Endotracheal tube is in grossly good position with distal tip approximately 7 cm above the carina. Nasogastric tube is seen entering stomach. Left internal jugular catheter line is noted with distal tip in expected position of the SVC. Bilateral perihilar and basilar interstitial densities are noted which are improved compared to prior exam. This is in consistent with improving pulmonary edema. No pneumothorax or significant pleural effusion is noted.  IMPRESSION: Mildly improved bilateral pulmonary edema.   Electronically Signed   By: Roque Lias M.D.   On: 03/09/2014 13:49   Dg Chest Port 1 View  03/09/2014   CLINICAL DATA:  Reassess known airspace disease and support tube positioning  EXAM: PORTABLE CHEST - 1 VIEW  COMPARISON:  Portable chest x-ray of 08 March 2014  FINDINGS: The lungs are adequately inflated. Widespread airspace consolidation is present but may have slightly improved since yesterday's study. The hemidiaphragms are obscured. There is no pneumothorax. The cardiac silhouette is mildly enlarged. The pulmonary vascularity is engorged.  The endotracheal tube tip lies 3 cm above the crotch of the carina. The left internal jugular venous catheter tip projects over the junction of the proximal and midportions of the SVC. The esophagogastric tube tip projects off the inferior margin of the image.  IMPRESSION: 1. Stable to slightly improved appearance of the pulmonary interstitial edema. 2. The support tubes and lines are in reasonable position.   Electronically Signed   By: Onalee Hua  [place]   On:  03/09/2014 07:50   Dg Chest Port 1 View  03/08/2014   CLINICAL DATA:  Evaluate endotracheal tube position  EXAM: PORTABLE CHEST - 1 VIEW  COMPARISON:  Portable chest x-ray of 03/07/2014  FINDINGS: There has been worsening of airspace disease bilaterally which is primarily perihilar in location most consistent with pulmonary edema and probable effusions. Mild cardiomegaly is stable. The tip of the endotracheal tube is approximately 2.8 cm above the carina. Left IJ central venous catheter remains unchanged in position  IMPRESSION: 1. Worsening of pulmonary edema with probable effusions. 2. Tip of endotracheal tube approximately 2.8 cm above the carina.   Electronically Signed   By: Dwyane Dee M.D.   On: 03/08/2014 08:08   Portable Chest Xray In Am  03/07/2014   CLINICAL DATA:  Hypoxia  EXAM: PORTABLE CHEST - 1 VIEW  COMPARISON:  March 06, 2014  FINDINGS: Endotracheal tube tip is 2.5 cm above the carina. Central catheter tip is at the junction of the left innominate vein and superior vena cava. Nasogastric tube tip and side port are below the diaphragm. No pneumothorax. There is interstitial edema with cardiomegaly and pulmonary venous hypertension. There is a right effusion. There is a minimal left effusion as well. There is mild airspace consolidation in the left base. No adenopathy.  IMPRESSION: Tube and catheter positions as described without pneumothorax. Evidence of a degree of congestive heart failure. Question alveolar edema versus superimposed pneumonia left base.   Electronically Signed   By: Bretta Bang M.D.   On: 03/07/2014 07:23   Dg Chest Port 1 View  03/06/2014   CLINICAL DATA:  Endotracheal tube and central line placement.  EXAM: PORTABLE CHEST - 1 VIEW  COMPARISON:  Same day.  FINDINGS: Endotracheal tube is in grossly good position with distal tip 4 cm above the carina. Left internal jugular catheter line is noted with distal tip in expected position of the SVC. No pneumothorax is  noted. Stable bilateral perihilar and basilar opacities are noted concerning for edema or pneumonia.  IMPRESSION: No pneumothorax status post left internal jugular catheter line placement. Endotracheal tube in grossly good position. These results will be called to the ordering clinician or representative by the Radiologist Assistant, and communication documented in the PACS or zVision Dashboard.   Electronically Signed   By: Roque Lias M.D.   On: 03/06/2014 13:02   Dg Chest Port 1 View  03/06/2014   CLINICAL DATA:  Shortness of breath.  EXAM: PORTABLE CHEST - 1 VIEW  COMPARISON:  Chest radiograph performed 03/05/2014  FINDINGS: The lungs are well-aerated. Right basilar airspace opacity may reflect pneumonia or mild pulmonary edema. Underlying vascular congestion is noted. A small right pleural effusion is suspected. No pneumothorax is seen.  The cardiomediastinal silhouette is borderline enlarged. No acute osseous abnormalities are seen.  IMPRESSION: 1. Right basilar airspace opacity may reflect pneumonia or mild pulmonary edema. Small right pleural effusion suspected. 2. Underlying vascular congestion and borderline cardiomegaly.   Electronically Signed   By: Roanna Raider M.D.   On: 03/06/2014 06:48   Dg Chest Portable 1 View  03/05/2014   CLINICAL DATA:  Leg pain.  Smoker.  EXAM: PORTABLE CHEST - 1 VIEW  COMPARISON:  Chest radiograph 09/25/2013  FINDINGS: Cardiac leads project over the chest. Cardiomediastinal silhouette is stable. There is atherosclerotic calcification in the thoracic aortic arch. Lung volumes are low resulting in crowding of pulmonary vascular markings. No airspace disease or pulmonary edema is  identified. No acute bony abnormality appreciated.  IMPRESSION: Low lung volumes.  No acute findings identified.  Atherosclerosis of the thoracic aorta.   Electronically Signed   By: Britta Mccreedy M.D.   On: 03/05/2014 19:15   Dg Abd Portable 1v  03/16/2014   CLINICAL DATA:  Feeding tube  placement  EXAM: PORTABLE ABDOMEN - 1 VIEW  COMPARISON:  03/08/2014  FINDINGS: The weighted feeding tube is identified in the left upper quadrant of the abdomen. This is in the expected location of the body of stomach. Right upper quadrant surgical clips are noted. There is enteric contrast material opacifying the colon.  IMPRESSION: 1. Tip of the feeding tube is in the expected location of the body of stomach.   Electronically Signed   By: Signa Kell M.D.   On: 03/16/2014 19:23   Dg Abd Portable 1v  03/08/2014   CLINICAL DATA:  Orogastric tube placement  EXAM: PORTABLE ABDOMEN - 1 VIEW  COMPARISON:  None.  FINDINGS: Orogastric tube tip and side port are in the stomach. There is contrast in the colon. The bowel gas pattern is unremarkable. No obstruction or free air appreciable.  IMPRESSION: Orogastric tube tip and side port are in the stomach. Bowel gas pattern unremarkable.   Electronically Signed   By: Bretta Bang M.D.   On: 03/08/2014 16:09   Dg Abd Portable 1v  03/06/2014   CLINICAL DATA:  NG tube placement.  EXAM: PORTABLE ABDOMEN - 1 VIEW  COMPARISON:  03/06/2014  FINDINGS: The endotracheal tube is 2.7 cm above the carina. The left IJ catheter tip is in the mid SVC. The NG tube tip is in the stomach. Persistent edema and right lower lobe airspace process.  IMPRESSION: The NG tube is in the stomach.  ETT is 2.7 cm above the carina.  Left IJ catheter tip is in the mid SVC.   Electronically Signed   By: Loralie Champagne M.D.   On: 03/06/2014 15:43   Dg Foot Complete Left  03/05/2014   CLINICAL DATA:  Left foot pain. Soft tissue ulceration. Redness and discoloration.  EXAM: LEFT FOOT - COMPLETE 3+ VIEW  COMPARISON:  08/10/2012  FINDINGS: There is no fracture or dislocation or bone destruction suggestive of osteomyelitis. There are 2 screws in the distal tibia and a side plate and multiple screws in the distal fibula. 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 Small plantar  calcaneal spur.  IMPRESSION: No acute abnormalities.  No change since the prior exam.   Electronically Signed   By: Geanie Cooley M.D.   On: 03/05/2014 22:09    Scheduled Meds: . aspirin  81 mg Oral Daily  . atorvastatin  40 mg Oral q1800  . carvedilol  3.125 mg Oral BID WC  .  ceFAZolin (ANCEF) IV  1 g Intravenous 3 times per day  . digoxin  0.125 mg Oral Daily  . doxycycline  100 mg Oral Q12H  . escitalopram  10 mg Oral Daily  . feeding supplement (PRO-STAT SUGAR FREE 64)  30 mL Per Tube QID  . furosemide  40 mg Oral Daily  . gabapentin  300 mg Oral TID  . heparin subcutaneous  5,000 Units Subcutaneous 3 times per day  . insulin aspart  0-20 Units Subcutaneous 6 times per day  . insulin glargine  15 Units Subcutaneous Daily  . ipratropium-albuterol  3 mL Nebulization Q6H  . isosorbide mononitrate  30 mg Oral BID  . losartan  50 mg  Oral Daily  . predniSONE  30 mg Oral Q breakfast   Continuous Infusions: . sodium chloride 10 mL/hr at 03/16/14 0500  . dextrose 5 % with KCl 20 mEq / L 20 mEq (03/17/14 1649)  . feeding supplement (JEVITY 1.2 CAL) 1,000 mL (03/17/14 2249)    Principal Problem:   Sepsis Active Problems:   Left leg pain   Anemia   Diabetes mellitus, controlled   NSTEMI (non-ST elevated myocardial infarction)   Acute respiratory failure   Acute systolic heart failure    Time spent: 40 minutes   [facility]  [facility] Pager [phone]. If 7PM-7AM, please contact night-coverage at [email or web address], password [facility] 03/18/2014, 9:58 AM  LOS: 13 days

## 2014-03-18 NOTE — Progress Notes (Signed)
Speech Language Pathology Treatment: Dysphagia  Patient Details Name: LAURIEANNE GALLOWAY MRN: [id number] DOB: [birth year] Today's Date: 03/18/2014 Time: 1211-1221 SLP Time Calculation (min): 10 min  Assessment / Plan / Recommendation Clinical Impression  Pt continues to present with s/s of aspiration with POs.  Min verbal cues for airway protection with ice chips.  Voice remains weak/dysphonic s/p extubation - laryngeal dysfunction the probable source of dysphagia.  Recommend continued NPO except ice chips; will follow next date for improvements at bedside.  May need instrumental swallow study.  Educated pt, who verbalizes understanding with recs.    HPI HPI: 58 year old female with h/o COPD, DM, peripheral vascular disease, admitted worsening left lower extremity pain and  and abdominal pain, developed acute hypoxic respiratory failure due to pulmonary edema requiring intubation, NSTEMI, MSSA bacteremia after recent angiogram. Initial CXR 9/13 clear however CXR 9/14 notes right basilar airspace opacity may reflect pneumonia.       SLP Plan  Continue with current plan of care    Recommendations Diet recommendations: NPO Medication Administration: Via alternative means              Oral Care Recommendations: Oral care prior to ice chips Follow up Recommendations:  (tba) Plan: Continue with current plan of care    Rayner Erman L. Samson Frederic, [place] CCC/SLP Pager [phone]      Blenda Mounts Laurice 03/18/2014, 12:22 PM

## 2014-03-18 NOTE — Progress Notes (Signed)
PROGRESS NOTE  Subjective:    Feeling better now.  Mild diarrhea.  Not SOB.  Vague indigestion pain.  Cox 61%.  Off milrinone.  CVP8  Objective:    Vital Signs:   Temp:  [97.9 F (36.6 C)-98.5 F (36.9 C)] 97.9 F (36.6 C) (09/26 0345) Pulse Rate:  [56-96] 72 (09/26 1100) Resp:  [15-26] 18 (09/26 1100) BP: (96-136)/(38-72) 125/60 mmHg (09/26 0805) SpO2:  [90 %-100 %] 97 % (09/26 1100) Weight:  [89.2 kg (196 lb 10.4 oz)] 89.2 kg (196 lb 10.4 oz) (09/26 0422)  Last BM Date: 03/17/14   24-hour weight change: Weight change: 0.9 kg (1 lb 15.8 oz)  Weight trends: Filed Weights   03/16/14 0450 03/17/14 0500 03/18/14 0422  Weight: 87 kg (191 lb 12.8 oz) 88.3 kg (194 lb 10.7 oz) 89.2 kg (196 lb 10.4 oz)    Intake/Output:  09/25 0701 - 09/26 0700 In: 2558 [I.V.:1178; NG/GT:1080; IV Piggyback:300] Out: 670 [Urine:670] Total I/O In: 330 [I.V.:150; NG/GT:180] Out: 300 [Urine:300]   Physical Exam: BP 125/60  Pulse 72  Temp(Src) 97.9 F (36.6 C) (Oral)  Resp 18  Ht  (1.575 m)  Wt 89.2 kg (196 lb 10.4 oz)  BMI 35.96 kg/m2  SpO2 97%  Wt Readings from Last 3 Encounters:  03/18/14 89.2 kg (196 lb 10.4 oz)  03/18/14 89.2 kg (196 lb 10.4 oz)  03/01/14 88.905 kg (196 lb)    General: Vital signs reviewed and noted. Extubated. Chronically ill appearing  Head: Normocephalic, atraumatic.  Throat: normal  Neck:  IJ in place   Lungs:  Decreased BS throughout no wheezing  Heart:  RRR, no s3 2/6 TR  Abdomen:  Soft, non-tender, non-distended    Extremities:  Warm no edema.   Neurologic: Moves all 4  Psych: A&Ox3    Labs: BMET:  Recent Labs  03/17/14 0400 03/17/14 1505 03/18/14 0355  NA 146  --  141  K 4.6  --  3.9  CL 109  --  104  CO2 28  --  29  GLUCOSE 209*  --  207*  BUN 16  --  17  CREATININE 0.37*  --  0.37*  CALCIUM 8.1*  --  8.0*  MG 2.1 2.0 2.0  PHOS 3.0 3.0 4.3    Liver function tests:  Recent Labs  03/15/14 1830  PROT 5.9*    CBC:  Recent Labs  03/16/14 0500 03/17/14 0400  WBC 8.7 6.3  HGB 7.7* 9.1*  HCT 26.0* 29.7*  MCV 75.1* 74.1*  PLT 198 196    Medications:    Infusions: . sodium chloride 10 mL/hr at 03/16/14 0500  . feeding supplement (JEVITY 1.2 CAL) 1,000 mL (03/17/14 2249)    Scheduled Medications: . aspirin  81 mg Oral Daily  . atorvastatin  40 mg Oral q1800  . carvedilol  3.125 mg Oral BID WC  .  ceFAZolin (ANCEF) IV  1 g Intravenous 3 times per day  . digoxin  0.125 mg Oral Daily  . doxycycline  100 mg Oral Q12H  . escitalopram  10 mg Oral Daily  . feeding supplement (PRO-STAT SUGAR FREE 64)  30 mL Per Tube QID  . furosemide  40 mg Oral Daily  . gabapentin  300 mg Oral TID  . heparin subcutaneous  5,000 Units Subcutaneous 3 times per day  . insulin aspart  0-20 Units Subcutaneous 6 times per day  . insulin glargine  15 Units Subcutaneous Daily  .  ipratropium-albuterol  3 mL Nebulization Q6H  . isosorbide mononitrate  30 mg Oral BID  . losartan  50 mg Oral Daily  . predniSONE  30 mg Oral Q breakfast    Assessment/ Plan:    1. NSTEMI with severe three-vessel coronary artery disease - not amenable to PCI 2. Acute on chronic systolic congestive heart failure with an EF of 10-15% 3. Acute respiratory failure improving 4. Fever/leukocytosis    --MSSA bacteremia last week 5. Diabetes mellitus 6. COPD 7. Peripheral vascular disease -status post right iliac stent, status post fem - tibioperoneal trunk. L foot ulcerations 8. Hypernatremia 9. Iron-def anemia  Increase lasix to 40 BID with weight gain,  Titrate losartan. Over weekend would continue to titrate losartan and diuretics as needed. Keep CVP 8-10 range for now. If co-ox drops below 50% would restart milrinone.    Denym Christenberry JR,W SPENCER,MD 11:34 AM

## 2014-03-19 ENCOUNTER — Inpatient Hospital Stay (HOSPITAL_COMMUNITY): Payer: BC Managed Care – PPO

## 2014-03-19 LAB — GLUCOSE, CAPILLARY
Glucose-Capillary: 171 mg/dL — ABNORMAL HIGH (ref 70–99)
Glucose-Capillary: 182 mg/dL — ABNORMAL HIGH (ref 70–99)
Glucose-Capillary: 191 mg/dL — ABNORMAL HIGH (ref 70–99)
Glucose-Capillary: 234 mg/dL — ABNORMAL HIGH (ref 70–99)
Glucose-Capillary: 267 mg/dL — ABNORMAL HIGH (ref 70–99)
Glucose-Capillary: 282 mg/dL — ABNORMAL HIGH (ref 70–99)

## 2014-03-19 LAB — CBC
HCT: 30.8 % — ABNORMAL LOW (ref 36.0–46.0)
Hemoglobin: 9.5 g/dL — ABNORMAL LOW (ref 12.0–15.0)
MCH: 22.8 pg — AB (ref 26.0–34.0)
MCHC: 30.8 g/dL (ref 30.0–36.0)
MCV: 73.9 fL — ABNORMAL LOW (ref 78.0–100.0)
PLATELETS: 236 10*3/uL (ref 150–400)
RBC: 4.17 MIL/uL (ref 3.87–5.11)
RDW: 18.4 % — ABNORMAL HIGH (ref 11.5–15.5)
WBC: 6.4 10*3/uL (ref 4.0–10.5)

## 2014-03-19 LAB — BODY FLUID CULTURE: Culture: NO GROWTH

## 2014-03-19 LAB — PRO B NATRIURETIC PEPTIDE: PRO B NATRI PEPTIDE: 4761 pg/mL — AB (ref 0–125)

## 2014-03-19 LAB — BASIC METABOLIC PANEL
Anion gap: 11 (ref 5–15)
BUN: 16 mg/dL (ref 6–23)
CALCIUM: 8 mg/dL — AB (ref 8.4–10.5)
CO2: 28 meq/L (ref 19–32)
CREATININE: 0.34 mg/dL — AB (ref 0.50–1.10)
Chloride: 102 mEq/L (ref 96–112)
GFR calc Af Amer: >90 mL/min (ref >90)
GFR calc non Af Amer: >90 mL/min (ref >90)
Glucose, Bld: 226 mg/dL — ABNORMAL HIGH (ref 70–99)
Potassium: 3.8 mEq/L (ref 3.7–5.3)
Sodium: 141 mEq/L (ref 137–147)

## 2014-03-19 LAB — PHOSPHORUS: PHOSPHORUS: 3.2 mg/dL (ref 2.3–4.6)

## 2014-03-19 LAB — MAGNESIUM: MAGNESIUM: 2 mg/dL (ref 1.5–2.5)

## 2014-03-19 MED ORDER — CEFAZOLIN SODIUM-DEXTROSE 2-3 GM-% IV SOLR
2.0000 g | Freq: Three times a day (TID) | INTRAVENOUS | Status: AC
  Administered 2014-03-19 – 2014-03-25 (×20): 2 g via INTRAVENOUS
  Filled 2014-03-19 (×21): qty 50

## 2014-03-19 NOTE — Procedures (Signed)
Objective Swallowing Evaluation: Modified Barium Swallowing Study  Patient Details  Name: [name] MRN: [id number] Date of Birth: [birth year]  Today's Date: 03/19/2014 Time: 1530-1600 SLP Time Calculation (min): 30 min  Past Medical History:  Past Medical History  Diagnosis Date  . Diabetes mellitus   . Peripheral vascular disease   . Hyperlipidemia   . Obesity   . Hypertension   . Arthritis   . Ulcer of ankle     right  . Coronary artery disease   . COPD (chronic obstructive pulmonary disease)   . NSTEMI (non-ST elevated myocardial infarction)    Past Surgical History:  Past Surgical History  Procedure Laterality Date  . Cholecystectomy    . Femoral-tibial bypass graft Right 09/27/2013    Procedure: BYPASS GRAFT FEMORAL-TIBIAL ARTERY-RIGHT;  Surgeon: Nada Libman, MD;  Location: [facility] OR;  Service: Vascular;  Laterality: Right;  . Abdominal aorta stent Right   . Bypass graft popliteal to tibial Right   . Coronary angioplasty with stent placement      proximcal RCA stent ~ 2001  . 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 FEES on 9/24 revealed severe acute reversible dysphagia characterized by intubation related sensory deficits, generalized weakness, and moderately severe laryngeal and pharyngeal edema resulting in a delayed swallow initiation and decreased laryngeal closure. Deep penetration and aspiration with inconsistent sensation of all consistencies.  NG placed.  MBS today to determine any  potential for conservative POs.       Assessment / Plan / Recommendation Clinical Impression  Dysphagia Diagnosis: Severe pharyngeal phase dysphagia Clinical impression: Pt presents with persisting severe pharyngeal phase dysphagia related primarily to impaired laryngeal function during the swallow.  Pt continued to present with sensory deficits, delayed onset of swallow response, and penetration of nectar and honey-thick liquids posteriorly into larynx.  These consistencies were then aspirated and coursed down posterior tracheal wall.  Occasionally, a cough response was elicited, but aspiration was generally silent.  Purees were not observed to enter larynx; however, shoulder position obfuscated view during portion of study. Pt with marginal gains but is not yet safe to begin a PO diet.   Recommend: 1) continue NPO with NG feeds; 2) begin therapeutic trials of purees with SLP only;  3) therapeutic exercise to target laryngeal strengthening; 4) repeat FEES in 3-5 days.      Treatment Recommendation  Therapy as outlined in treatment plan below    Diet Recommendation NPO;Alternative means - temporary;Ice chips PRN after oral care   Medication Administration: Via alternative means    Other  Recommendations Oral Care Recommendations: Oral care prior to ice chips   Follow Up Recommendations       Frequency and Duration min 3x week  2 weeks       SLP Swallow Goals     General HPI: 58 year old female with h/o COPD, DM, peripheral vascular disease, admitted worsening left lower extremity pain and  and abdominal pain, developed acute hypoxic respiratory failure due to pulmonary edema requiring intubation 9/14-9/21, extubated and reintubated 9/21-9/23, NSTEMI,  MSSA bacteremia after recent angiogram. FEES on 9/24 revealed severe acute reversible dysphagia characterized by intubation related sensory deficits, generalized weakness, and moderately severe laryngeal and pharyngeal edema resulting in a  delayed swallow initiation and decreased laryngeal closure. Deep penetration and aspiration with inconsistent sensation of all consistencies.  NG placed.  MBS today to determine any potential for conservative POs.   Type of Study: Modified Barium Swallowing Study Reason for Referral: Objectively evaluate swallowing function Previous Swallow Assessment: see HPI Diet Prior to this Study: NPO Temperature Spikes Noted: No Respiratory Status: Nasal cannula History of Recent Intubation: Yes Length of Intubations (days): 7 days Date extubated: 03/15/14 Behavior/Cognition: Alert;Cooperative;Pleasant mood Oral Cavity - Dentition: Adequate natural dentition Oral Motor / Sensory Function: Within functional limits Self-Feeding Abilities: Able to feed self Patient Positioning: Upright in chair Baseline Vocal Quality: Hoarse;Breathy;Low vocal intensity Volitional Cough: Weak Volitional Swallow: Able to elicit Anatomy: Within functional limits (edema visualized on prior FEES; MBS unable to identify) Pharyngeal Secretions: Not observed secondary MBS    Reason for Referral Objectively evaluate swallowing function   Oral Phase Oral Preparation/Oral Phase Oral Phase: Impaired Oral - Honey Oral - Honey Cup: Lingual pumping Oral - Nectar Oral - Nectar Cup: Lingual pumping Oral - Solids Oral - Puree: Lingual pumping   Pharyngeal Phase Pharyngeal Phase Pharyngeal Phase: Impaired Pharyngeal - Honey Pharyngeal - Honey Teaspoon: Delayed swallow initiation;Pharyngeal residue - valleculae;Pharyngeal residue - pyriform sinuses;Trace aspiration;Reduced airway/laryngeal closure;Premature spillage to pyriform sinuses;Penetration/Aspiration during swallow Penetration/Aspiration details (honey teaspoon): Material enters airway, passes BELOW cords without attempt by patient to eject out (silent aspiration) Pharyngeal - Honey Cup: Delayed swallow initiation;Pharyngeal residue - valleculae;Pharyngeal residue -  pyriform sinuses;Trace aspiration;Reduced airway/laryngeal closure;Premature spillage to pyriform sinuses;Penetration/Aspiration during swallow Penetration/Aspiration details (honey cup): Material enters airway, passes BELOW cords without attempt by patient to eject out (silent aspiration) Pharyngeal - Nectar Pharyngeal - Nectar Teaspoon: Not tested Pharyngeal - Nectar Cup: Delayed swallow initiation;Pharyngeal residue - valleculae;Pharyngeal residue - pyriform sinuses;Trace aspiration;Reduced airway/laryngeal closure;Premature spillage to pyriform sinuses;Penetration/Aspiration during swallow Penetration/Aspiration details (nectar cup): Material enters airway, passes BELOW cords without attempt by patient to eject out (silent aspiration) Pharyngeal - Thin Pharyngeal - Ice Chips: Not tested Pharyngeal - Solids Pharyngeal - Puree: Delayed swallow initiation;Pharyngeal residue - valleculae;Pharyngeal residue - pyriform sinuses;Premature spillage to valleculae Penetration/Aspiration details (puree): Material enters airway, remains ABOVE vocal cords and not ejected out  Cervical Esophageal Phase   Maudine Kluesner L. Samson Frederic, [place] CCC/SLP Pager [phone]               Blenda Mounts Laurice 03/19/2014, 4:50 PM

## 2014-03-19 NOTE — Progress Notes (Signed)
PROGRESS NOTE  Subjective:    Moved out of unit and on stepdown.  Feels a lot better.  Not SOB.  Vague indigestion pain.    CVP 7  Objective:    Vital Signs:   Temp:  [97.5 F (36.4 C)-98.5 F (36.9 C)] 98.2 F (36.8 C) (09/27 0911) Pulse Rate:  [64-89] 86 (09/27 0911) Resp:  [15-24] 22 (09/27 0911) BP: (120-142)/(49-85) 120/51 mmHg (09/27 0911) SpO2:  [91 %-100 %] 91 % (09/27 0911) Weight:  [90.2 kg (198 lb 13.7 oz)] 90.2 kg (198 lb 13.7 oz) (09/26 1819)  Last BM Date: 03/18/14   24-hour weight change: Weight change: 1 kg (2 lb 3.3 oz)  Weight trends: Filed Weights   03/17/14 0500 03/18/14 0422 03/18/14 1819  Weight: 88.3 kg (194 lb 10.7 oz) 89.2 kg (196 lb 10.4 oz) 90.2 kg (198 lb 13.7 oz)    Intake/Output:  09/26 0701 - 09/27 0700 In: 2795 [I.V.:230; NG/GT:765; IV Piggyback:100] Out: 1800 [Urine:1800]     Physical Exam: BP 120/51  Pulse 86  Temp(Src) 98.2 F (36.8 C) (Oral)  Resp 22  Ht  (1.575 m)  Wt 90.2 kg (198 lb 13.7 oz)  BMI 36.36 kg/m2  SpO2 91%  Wt Readings from Last 3 Encounters:  03/18/14 90.2 kg (198 lb 13.7 oz)  03/18/14 90.2 kg (198 lb 13.7 oz)  03/01/14 88.905 kg (196 lb)    General: Vital signs reviewed and noted.  Chronically ill appearing  Head: Normocephalic, atraumatic.  Throat: normal  Neck:  IJ in place   Lungs:  Decreased BS throughout no wheezing  Heart:  RRR, no s3 2/6 TR  Abdomen:  Soft, non-tender, non-distended    Extremities:  Warm no edema.   Neurologic: Moves all 4  Psych: A&Ox3    Labs: BMET:  Recent Labs  03/18/14 0355 03/18/14 1525 03/19/14 0630 03/19/14 0901  NA 141  --   --  141  K 3.9  --   --  3.8  CL 104  --   --  102  CO2 29  --   --  28  GLUCOSE 207*  --   --  226*  BUN 17  --   --  16  CREATININE 0.37*  --   --  0.34*  CALCIUM 8.0*  --   --  8.0*  MG 2.0 2.1 2.0  --   PHOS 4.3 3.6 3.2  --    CBC:  Recent Labs  03/17/14 0400 03/19/14 0630  WBC 6.3 6.4  HGB 9.1* 9.5*  HCT  29.7* 30.8*  MCV 74.1* 73.9*  PLT 196 236    Medications:    Infusions: . sodium chloride 10 mL/hr at 03/18/14 2200  . feeding supplement (JEVITY 1.2 CAL) 1,000 mL (03/19/14 0000)    Scheduled Medications: . aspirin  81 mg Oral Daily  . atorvastatin  40 mg Oral q1800  . carvedilol  3.125 mg Oral BID WC  .  ceFAZolin (ANCEF) IV  2 g Intravenous 3 times per day  . digoxin  0.125 mg Oral Daily  . doxycycline  100 mg Oral Q12H  . escitalopram  10 mg Oral Daily  . feeding supplement (PRO-STAT SUGAR FREE 64)  30 mL Per Tube QID  . furosemide  40 mg Oral BID  . gabapentin  300 mg Oral TID  . heparin subcutaneous  5,000 Units Subcutaneous 3 times per day  . insulin aspart  0-20 Units Subcutaneous 6 times per day  .  insulin glargine  15 Units Subcutaneous Daily  . ipratropium-albuterol  3 mL Nebulization Q6H  . isosorbide mononitrate  30 mg Oral BID  . losartan  50 mg Oral Daily  . predniSONE  20 mg Oral Q breakfast    Assessment/ Plan:    1. NSTEMI with severe three-vessel coronary artery disease - not amenable to PCI 2. Acute on chronic systolic congestive heart failure with an EF of 10-15% clinically improving 3. Acute respiratory failure improving 4. Fever/leukocytosis    --MSSA bacteremia last week 5. Diabetes mellitus 6. COPD 7. Peripheral vascular disease -status post right iliac stent, status post fem - tibioperoneal trunk. L foot ulcerations 8. Hypernatremia 9. Iron-def anemia  Continue Lasix 40 BID,  Reduce CVP to q shift.  Eleonora Peeler JR,W SPENCER,MD 11:23 AM

## 2014-03-19 NOTE — Progress Notes (Addendum)
Speech Language Pathology Treatment: Dysphagia  Patient Details Name: LATEEFAH MALLERY MRN: [id number] DOB: [birth year] Today's Date: 03/19/2014 Time: 5784-6962 SLP Time Calculation (min): 14 min  Assessment / Plan / Recommendation Clinical Impression  Pt continues to demonstrate s/s of aspiration (consistent cough) with liquid consistencies.  Phonation remains hoarse.  Toleration of purees at bedside with marginal improvements; however, may be difficult to discern given hx of silent aspiration. Pt c/o NG feeds causing stomach discomfort.  Recommend proceeding with MBS today to determine any improvements and readiness for conservative POs.    HPI HPI: 58 year old female with h/o COPD, DM, peripheral vascular disease, admitted worsening left lower extremity pain and  and abdominal pain, developed acute hypoxic respiratory failure due to pulmonary edema requiring intubation, NSTEMI, MSSA bacteremia after recent angiogram. Initial CXR 9/13 clear however CXR 9/14 notes right basilar airspace opacity may reflect pneumonia.    Pertinent Vitals Pain Assessment: No/denies pain  SLP Plan  Continue with current plan of care    Recommendations Diet recommendations: NPO Medication Administration: Via alternative means       REC MBS today       Plan: Continue with current plan of care   Park Beck L. Samson Frederic, [place] CCC/SLP Pager [phone]      Blenda Mounts Laurice 03/19/2014, 12:54 PM

## 2014-03-19 NOTE — Progress Notes (Signed)
[facility] PROGRESS NOTE  [name]:[id number] DOB: 11/18/1955 DOA: 03/05/2014 PCP: Kirk Ruths, MD  Assessment/Plan: Principal Problem:   Sepsis Active Problems:   Left leg pain   Anemia   Diabetes mellitus, controlled   NSTEMI (non-ST elevated myocardial infarction)   Acute respiratory failure   Acute systolic heart failure     Transfer to [facility] service SDU 9/26   OETT>>9/14>>9/19 ext; reintubated 9/21 > extubated 9/23  Acute hypoxic resp failure due to ischemia and flash edema  Bilateral effusions L >R > transudate, only 500cc removed 9/23 from left  Tobacco abuse  COPD, with improving exacerbation  Continue incentive spirometry  Wean prednisone  Flutter valve  OOB  O2 as needed for SpO2 >92%  BD scheduled and PRN    CVL>>9/14>>9/20  CVL #2 >> 9/21 >>  Severe three vessel CAD > not a CABG candidate  Ischemic cardiomyopathy  Septic shock> resolved  NSTEMI with severe three-vessel coronary artery disease - not amenable to PCI  Acute on chronic systolic congestive heart failure with an EF of 10-15%  Heart failure clinically improving Keep CVP 8-10 range for now. If co-ox drops below 50% would restart milrinone Continue Cozaar, Coreg, ASA , statin per cardiology  Continue heparin for DVT prophylaxis  Lasix 40 BID, Reduce CVP to q shift   Hypophos/hypokalemia  Keep K > 4.0, follow magnesium  Check BMET/mg/phos in AM    Chronic anemia  L inguinal hematoma without evidence of bleeding  Transfuse per protocol for hgb <8.0 given ischemia  Monitor left groin closely   MSSA Bacteremia after recent angiogram, source uncertain, TEE negative, cultures now clear  Septic shock resolved  HCAP 9/22 > doubt as quantitative culture negative from BAL  BCx2 9/14>>MSSA  UC 9/14>>negative  WC 9/13>>pseudomonas  BC x2 9/15>>neg  BC x2 9/22>> 1/2 coag neg staph  BAL quant culture 9/22 >> 10K cfu OPF  Abx: Vanc , start date 9/14>>>9/17off; 9/21back  on>>9/25 off  Abx: cefepime, start date 9/14 (HCAP?)>>>9/21  Ancef 9/15>>>9/21; resumed 9/25  P:  D/C vanc  Resume cefazolin, plan 3 weeks total per last ID note  Change antibiotics to Levaquin for AE COPD >  Levofloxacin discontinued after 7 days on 9/27    DM last Hbg a1c 7.2  Continue lantus at 20  Continue SSI  Wean steroids this week    Code Status: full  Family Communication: family updated about patient's clinical progress  Disposition Plan: Transferred to step down   Brief narrative:  58 year old Caucasian female diabetic smoker with peripheral vascular disease was admitted to the hospital with fever, leukocytosis and leg pain and a groin hematoma from her recent arteriogram of the lower extremities. She went on to develop positive blood cultures and also Pseudomonas was cultured from her groin wound. She subsequently developed probable aspiration pneumonia hypertension then elevated cardiac enzymes. She has severe LV dysfunction on her echocardiogram as well as ventriculogram with multivessel CAD--the the vessels are poor targets for grafting. The patient was intubated for 10 days and just recently extubated. She has evidence of persistent pleural effusions from heart failure, she has not been out of bed yet, she has not resumed a normal diet and she is still quite hoarse. A recent blood culture was positive one out of 2 samples. With her sepsis, heart failure, ischemic cardiomyopathy with poor targets, and general deconditioning, malnutrition, and peripheral vascular disease she would not be candidate for CABG. She has had her saphenous vein harvested from  her right leg for femoropopliteal bypass and the left leg has evidence of severe venous stasis and probably poor quality conduit.  STUDIES:  9/14 Echo- EF 30-35%  9/16 TEE- no LAA thrombus, no valvular vegetation, anterior, anteroseptal, apical, and inferioapical hypokinesis, EF 35%  9/17 EKG- sinus tach, low voltage QRS, cannot  r/o anteroseptal infarct  9/18 LHC- 40% distal left main, severe diffuse disease from LAD, 95% stenosis circ, focal 75% mid RCA, LVEF 15%  9/21 L groin vascular ultrasound >>> no evidence of complication around graft site  SIGNIFICANT EVENTS:  9/14 hypoxia, hypotension . ++ Trop I  9/14 intubated  9/15 pos BC MSSA  9/16 TEE>>> see above  9/17- neg 2 liters, still bronchospastic  9/21 acute hypoxemic respiratory failure, re-intubated, cardiology started milrninone  9/23 thora left side 500c removed, Extubated  9/24 failed swallow evaluation; TCTS consult > not a CABG candidate, rec medical management, transfused 1 U PRBC  SUBJECTIVE: failed swallow eval, reports feeling much better     HPI/Subjective: No complaints,  Objective: Filed Vitals:   03/19/14 0810 03/19/14 0846 03/19/14 0911 03/19/14 1000  BP:  128/59 120/51 121/55  Pulse:  73 86 80  Temp:   98.2 F (36.8 C)   TempSrc:   Oral   Resp:   22 22  Height:      Weight:      SpO2: 100%  91% 93%    Intake/Output Summary (Last 24 hours) at 03/19/14 1145 Last data filed at 03/19/14 0000  Gross per 24 hour  Intake   2465 ml  Output   1500 ml  Net    965 ml    Exam:  General: alert & oriented x 3 In NAD  Cardiovascular: RRR, nl S1 s2  Respiratory: Decreased breath sounds at the bases, scattered rhonchi, no crackles  Abdomen: soft +BS NT/ND, no masses palpable  Extremities: No cyanosis and no edema      Data Reviewed: Basic Metabolic Panel:  Recent Labs Lab 03/15/14 0430 03/16/14 0500  03/17/14 0400 03/17/14 1505 03/18/14 0355 03/18/14 1525 03/19/14 0630 03/19/14 0901  NA 149* 145  --  146  --  141  --   --  141  K 3.9 3.8  --  4.6  --  3.9  --   --  3.8  CL 108 107  --  109  --  104  --   --  102  CO2 34* 30  --  28  --  29  --   --  28  GLUCOSE 160* 127*  --  209*  --  207*  --   --  226*  BUN 25* 18  --  16  --  17  --   --  16  CREATININE 0.42* 0.36*  --  0.37*  --  0.37*  --   --  0.34*   CALCIUM 7.8* 7.9*  --  8.1*  --  8.0*  --   --  8.0*  MG  --   --   < > 2.1 2.0 2.0 2.1 2.0  --   PHOS  --   --   < > 3.0 3.0 4.3 3.6 3.2  --   < > = values in this interval not displayed.  Liver Function Tests:  Recent Labs Lab 03/15/14 1830  PROT 5.9*   No results found for this basename: LIPASE, AMYLASE,  in the last 168 hours No results found for this basename: AMMONIA,  in the  last 168 hours  CBC:  Recent Labs Lab 03/15/14 0430 03/15/14 1000 03/16/14 0500 03/17/14 0400 03/19/14 0630  WBC 11.9* 13.1* 8.7 6.3 6.4  NEUTROABS 9.5* 11.4*  --   --   --   HGB 7.9* 8.1* 7.7* 9.1* 9.5*  HCT 27.2* 28.4* 26.0* 29.7* 30.8*  MCV 76.0* 75.9* 75.1* 74.1* 73.9*  PLT 229 231 198 196 236    Cardiac Enzymes:  Recent Labs Lab 03/13/14 1142 03/13/14 1842 03/15/14 1000  TROPONINI 1.39* 1.78* 0.58*   BNP (last 3 results)  Recent Labs  03/06/14 1015 03/19/14 0630  PROBNP 8842.0* 4761.0*     CBG:  Recent Labs Lab 03/18/14 1520 03/18/14 2021 03/19/14 0019 03/19/14 0430 03/19/14 0910  GLUCAP 287* 206* 171* 191* 234*    Recent Results (from the past 240 hour(s))  CULTURE, BLOOD (ROUTINE X 2)     Status: None   Collection Time    03/14/14 11:06 AM      Result Value Ref Range Status   Specimen Description BLOOD LEFT HAND   Final   Special Requests BOTTLES DRAWN AEROBIC AND ANAEROBIC 10CC   Final   Culture  Setup Time     Final   Value: 03/14/2014 16:45     Performed at [facility]   Culture     Final   Value: STAPHYLOCOCCUS SPECIES (COAGULASE NEGATIVE)     Note: THE SIGNIFICANCE OF ISOLATING THIS ORGANISM FROM A SINGLE SET OF BLOOD CULTURES WHEN MULTIPLE SETS ARE DRAWN IS UNCERTAIN. PLEASE NOTIFY THE MICROBIOLOGY DEPARTMENT WITHIN ONE WEEK IF SPECIATION AND SENSITIVITIES ARE REQUIRED.     Note: Gram Stain Report Called to,Read Back By and Verified With: MONICA CROSS TAKEN FOR NURSE 12:55AM 03/16/14 THOMI     Performed at [facility]   Report  Status 03/17/2014 FINAL   Final  CULTURE, BLOOD (ROUTINE X 2)     Status: None   Collection Time    03/14/14 11:16 AM      Result Value Ref Range Status   Specimen Description BLOOD RIGHT HAND   Final   Special Requests BOTTLES DRAWN AEROBIC AND ANAEROBIC 10CC   Final   Culture  Setup Time     Final   Value: 03/14/2014 16:43     Performed at [facility]   Culture     Final   Value:        BLOOD CULTURE RECEIVED NO GROWTH TO DATE CULTURE WILL BE HELD FOR 5 DAYS BEFORE ISSUING A FINAL NEGATIVE REPORT     Performed at [facility]   Report Status PENDING   Incomplete  CULTURE, BAL-QUANTITATIVE     Status: None   Collection Time    03/14/14 12:47 PM      Result Value Ref Range Status   Specimen Description BRONCHIAL ALVEOLAR LAVAGE   Final   Special Requests Normal   Final   Gram Stain     Final   Value: FEW WBC PRESENT,BOTH PMN AND MONONUCLEAR     RARE SQUAMOUS EPITHELIAL CELLS PRESENT     NO ORGANISMS SEEN     Performed at [facility] Count     Final   Value: 10,000 COLONIES/ML     Performed at [facility]   Culture     Final   Value: Non-Pathogenic Oropharyngeal-type Flora Isolated.     Performed at [facility]   Report Status 03/16/2014 FINAL   Final  AFB  CULTURE WITH SMEAR     Status: None   Collection Time    03/15/14  2:33 PM      Result Value Ref Range Status   Specimen Description FLUID LEFT PLEURAL   Final   Special Requests NONE   Final   Acid Fast Smear     Final   Value: NO ACID FAST BACILLI SEEN     Performed at [facility]   Culture     Final   Value: CULTURE WILL BE EXAMINED FOR 6 WEEKS BEFORE ISSUING A FINAL REPORT     Performed at [facility]   Report Status PENDING   Incomplete  BODY FLUID CULTURE     Status: None   Collection Time    03/15/14  2:33 PM      Result Value Ref Range Status   Specimen Description FLUID LEFT PLEURAL   Final   Special Requests NONE   Final   Gram Stain      Final   Value: RARE WBC PRESENT, PREDOMINANTLY MONONUCLEAR     NO ORGANISMS SEEN     Performed at [facility]   Culture     Final   Value: NO GROWTH 2 DAYS     Performed at [facility]   Report Status PENDING   Incomplete  FUNGUS CULTURE W SMEAR     Status: None   Collection Time    03/15/14  2:33 PM      Result Value Ref Range Status   Specimen Description PLEURAL LEFT   Final   Special Requests NONE   Final   Fungal Smear     Final   Value: NO YEAST OR FUNGAL ELEMENTS SEEN     Performed at [facility]   Culture     Final   Value: CULTURE IN PROGRESS FOR FOUR WEEKS     Performed at [facility]   Report Status PENDING   Incomplete     Studies: Dg Tibia/fibula Left  03/05/2014   CLINICAL DATA:  Lower leg pain.  EXAM: LEFT TIBIA AND FIBULA - 2 VIEW  COMPARISON:  Screws in the distal tibia and side plate and screws in the distal fibula. The fractures have healed. 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 No acute osseous abnormality. Extensive vascular calcifications and dermal calcifications in the lower leg.  FINDINGS: No acute abnormalities. Arthritis at the knee and ankle. Postsurgical changes.  IMPRESSION: Negative.   Electronically Signed   By: Geanie Cooley M.D.   On: 03/05/2014 22:10   Ct Abdomen Pelvis W Contrast  03/06/2014   CLINICAL DATA:  Left flank pain began after recent vascular catheterization. Pain in the entire left leg. No fever, chills, nausea, vomiting.  EXAM: CT ABDOMEN AND PELVIS WITH CONTRAST  TECHNIQUE: Multidetector CT imaging of the abdomen and pelvis was performed using the standard protocol following bolus administration of intravenous contrast.  CONTRAST:  OMNIPAQUE IOHEXOL 300 MG/ML  SOLN  COMPARISON:  07/25/2013  FINDINGS: Lower chest: Dependent changes are seen in the lung bases. Scattered emphysematous changes are also present. There  is atherosclerotic calcification of the coronary vessels. Heart size is normal. No pericardial effusion.  Upper abdomen: Scattered calcified granulomata are identified within the liver. No focal abnormality identified within the spleen, pancreas, or left adrenal gland. There is a low-attenuation lesion adjacent to or involving the right adrenal  gland, consistent with a benign process. The patient has had previous cholecystectomy. Bilateral renal cysts are noted.  Bowel: Stomach has a normal appearance. A midline abdominal hernia contains mesenteric fat and nondilated loops of small bowel and appears stable over prior studies. Colonic loops are normal in appearance. There is moderate stool burden. The appendix has a normal appearance.  Pelvis: There is moderate stranding in the subcutaneous tissues and abdominal wall of the left inguinal region. Findings are consistent with edema/ hematoma after recent catheterization. Age no evidence for intraperitoneal hematoma. The uterus is present. No adnexal mass. The urinary bladder is distended.  Retroperitoneum: No retroperitoneal or mesenteric adenopathy. There is dense atherosclerotic calcification of the abdominal aorta. No aneurysm. Right iliac stent.  Osseous structures: Degenerative changes in the lower thoracic and upper lumbar spine. There is 4 mm of anterolisthesis of L3 on L4.  IMPRESSION: 1. Left inguinal hematoma. This does not extend into the peritoneal cavity. 2. Midline abdominal hernia contains mesenteric fat and nondilated loops of small bowel. 3. Distended urinary bladder. 4. Normal appendix. 5. Scattered emphysematous changes. 6. Coronary artery calcifications. 7. Calcified granulomata in the liver. 8. Benign right adrenal gland lesion.   Electronically Signed   By: Rosalie Gums M.D.   On: 03/06/2014 01:07   [place] Extrem Low Right Ltd  03/13/2014   CLINICAL DATA:  Evaluation of bypass graft.  EXAM: ULTRASOUND right LOWER EXTREMITY LIMITED  TECHNIQUE:  Ultrasound examination of the lower extremity soft tissues was performed in the area of clinical concern.  COMPARISON:  None.  FINDINGS: No focal fluid collections are identified around the graft in the right lower extremity. Patent arterial blood flow.  IMPRESSION: Negative ultrasound examination for complicating features associated with the right lower extremity bypass graft.   Electronically Signed   By: Loralie Champagne M.D.   On: 03/13/2014 09:29   Dg Chest Port 1 View  03/17/2014   CLINICAL DATA:  Hypoxia.  EXAM: PORTABLE CHEST - 1 VIEW  COMPARISON:  Single view of the chest 03/16/2014 and 03/15/2014.  FINDINGS: Right IJ catheter and feeding tube remain in place. Right greater than left pleural effusions and basilar atelectasis persist without marked change since yesterday's examination. Heart size is normal. No pneumothorax.  IMPRESSION: No marked change in right greater than left effusions and airspace disease since yesterday's examination.   Electronically Signed   By: Drusilla Kanner M.D.   On: 03/17/2014 07:08   Dg Chest Port 1 View  03/16/2014   CLINICAL DATA:  Shortness of breath  EXAM: PORTABLE CHEST - 1 VIEW  COMPARISON:  03/15/2014  FINDINGS: Cardiac shadow remains mildly enlarged. Bilateral pleural effusions are noted and mildly increased from the prior exam. Increasing right basilar atelectasis is noted. A right jugular central line is seen in the SVC without evidence of pneumothorax. The endotracheal tube and nasogastric catheter been removed in the interval.  IMPRESSION: Mild increase in bilateral pleural effusions with increase in right basilar atelectasis.   Electronically Signed   By: Alcide Clever M.D.   On: 03/16/2014 07:12   Dg Chest Port 1 View  03/15/2014   CLINICAL DATA:  Status post thoracentesis  EXAM: PORTABLE CHEST - 1 VIEW  COMPARISON:  Study obtained earlier in the day  FINDINGS: There is no appreciable pneumothorax. Left effusion is no longer appreciable. There is a  persistent right effusion, stable.  Endotracheal tube tip is 3.8 cm above the carina. Central catheter tip is in the superior vena cava. Nasogastric  tube tip and side port are below the diaphragm. Heart is upper normal in size with pulmonary vascularity within normal limits. No adenopathy.  IMPRESSION: Tube and catheter positions as described. Left effusion resolved. Right effusion persists. No pneumothorax. No change in cardiac silhouette.   Electronically Signed   By: Bretta Bang M.D.   On: 03/15/2014 15:14   Dg Chest Port 1 View  03/15/2014   CLINICAL DATA:  Assess endotracheal tube  EXAM: PORTABLE CHEST - 1 VIEW  COMPARISON:  03/14/2014  FINDINGS: Cardiomediastinal silhouette is stable. Endotracheal tube in place with tip 1.9 cm above the carina. Stable right IJ central line position. Stable NG tube position. Persistent mild congestion/ edema. Again noted small bilateral pleural effusion with bilateral basilar atelectasis or infiltrate.  IMPRESSION: Stable support apparatus. Persistent mild congestion/ edema. Again noted small bilateral pleural effusion with bilateral basilar atelectasis or infiltrate.   Electronically Signed   By: Natasha Mead M.D.   On: 03/15/2014 07:47   Dg Chest Port 1 View  03/14/2014   CLINICAL DATA:  Intubation.  EXAM: PORTABLE CHEST - 1 VIEW  COMPARISON:  02/22/2014.  FINDINGS: Endotracheal tube, NG tube, and right IJ line in stable position. Cardiomegaly with pulmonary alveolar infiltrates bilaterally. Bilateral pleural effusions. These findings are consistent with congestive heart failure. Pulmonary edema has increased from prior exam. No pneumothorax. No acute osseus abnormality. Carotid vascular calcification.  IMPRESSION: 1. Line and tubes are in good anatomic position. 2. Progressive pulmonary edema with small bilateral pleural effusions. 3. Carotid vascular disease.   Electronically Signed   By: Maisie Fus  Register   On: 03/14/2014 07:35   Dg Chest Port 1  View  03/13/2014   CLINICAL DATA:  Central line insertion.  Pulmonary edema.  EXAM: PORTABLE CHEST - 1 VIEW 2:21 p.m.  COMPARISON:  03/13/2014 at 8:45 a.m. and 03/12/2014  FINDINGS: Right jugular vein catheter is been inserted and the tip is in the superior vena cava at the level of the carina in good position. Endotracheal tube the is 3 cm above the carina. NG tube has been advanced and is now in the stomach.  Heart size and pulmonary vascularity are normal. The interstitial edema pattern has improved with slight residual haziness at the lung bases. There is new increased density at both bases consistent with posteriorly layered effusions.  No acute osseous abnormality.  IMPRESSION: New central line in good position.  NG tube now below the diaphragm.  New bilateral effusions.  Improved interstitial edema.   Electronically Signed   By: Geanie Cooley M.D.   On: 03/13/2014 14:36   Dg Chest Port 1 View  03/13/2014   CLINICAL DATA:  Intubation.  EXAM: PORTABLE CHEST - 1 VIEW  COMPARISON:  03/12/2014.  FINDINGS: Endotracheal tube noted 3 cm above the carina. NGT noted below the hemidiaphragm. Cardiomegaly with pulmonary vascular prominence and interstitial prominence consistent with congestive heart failure and interstitial edema. Bilateral pneumonitis cannot be excluded. No acute bony abnormality.  IMPRESSION: 1. Endotracheal tube noted 3 cm above the carina. NG tube in good anatomic position. 2. Congestive heart failure with pulmonary interstitial edema. Pulmonary interstitial pneumonitis cannot be excluded .   Electronically Signed   By: Maisie Fus  Register   On: 03/13/2014 09:03   Dg Chest Port 1 View  03/12/2014   CLINICAL DATA:  Follow-up pulmonary edema and effusions.  EXAM: PORTABLE CHEST - 1 VIEW  COMPARISON:  Portable chest x-rays yesterday dating back to 03/07/2014.  FINDINGS: Cardiac silhouette mildly enlarged  but stable. Interval improvement in the interstitial and airspace pulmonary edema, though mild  edema persists. Stable bilateral pleural effusions, right greater than left and dense consolidation in the lower lobes. No new pulmonary parenchymal abnormalities. Left jugular central venous catheter tip projects over the upper SVC, unchanged.  IMPRESSION: Support apparatus satisfactory. Improved CHF, with mild interstitial pulmonary edema persisting. Stable bilateral pleural effusions, right greater than left and associated dense passive atelectasis in the lower lobes. No new abnormalities.   Electronically Signed   By: Hulan Saas M.D.   On: 03/12/2014 08:15   Dg Chest Port 1 View  03/11/2014   CLINICAL DATA:  Pneumonitis.  EXAM: PORTABLE CHEST - 1 VIEW  COMPARISON:  03/10/2014  FINDINGS: Endotracheal and enteric tubes have been removed. Left jugular central venous catheter tip overlies the upper SVC. The cardiac silhouette remains enlarged. Pulmonary vascular congestion persists with similar appearance of bilateral airspace opacities and likely small bilateral pleural effusions. No pneumothorax.  IMPRESSION: No significant interval change in the appearance of the lungs, most suggestive of pulmonary edema and small bilateral pleural effusions.   Electronically Signed   By: Sebastian Ache   On: 03/11/2014 13:25   Dg Chest Portable 1 View  03/10/2014   CLINICAL DATA:  Evaluate airspace disease and endotracheal tube position  EXAM: PORTABLE CHEST - 1 VIEW  COMPARISON:  Portable chest x-ray of 03/09/2014  FINDINGS: There is little change in diffuse airspace disease most consistent with edema. Cardiomegaly and probable small effusions remain. The endotracheal tube tip is approximately 4.9 cm above the carina. NG tube extends below the hemidiaphragm.  IMPRESSION: Little change in poor aeration in diffuse airspace disease most consistent with edema with probable effusions.   Electronically Signed   By: Dwyane Dee M.D.   On: 03/10/2014 07:51   Dg Chest Port 1 View  03/09/2014   CLINICAL DATA:  Respiratory  distress.  EXAM: PORTABLE CHEST - 1 VIEW  COMPARISON:  03/09/2014  FINDINGS: Endotracheal tube is in place with tip approximately 3 cm above the carina. Left IJ central line tip overlies the level of the superior vena cava. Nasogastric tube is in place with tip off the film but beyond the gastroesophageal junction.  The heart is enlarged. There are central airspace filling opacity similar in appearance to the prior study.  IMPRESSION: Persistent bilateral airspace filled opacities.   Electronically Signed   By: Rosalie Gums M.D.   On: 03/09/2014 19:59   Dg Chest Port 1 View  03/09/2014   CLINICAL DATA:  Pulmonary edema.  EXAM: PORTABLE CHEST - 1 VIEW  COMPARISON:  Same day.  FINDINGS: Stable mild cardiomegaly. Endotracheal tube is in grossly good position with distal tip approximately 7 cm above the carina. Nasogastric tube is seen entering stomach. Left internal jugular catheter line is noted with distal tip in expected position of the SVC. Bilateral perihilar and basilar interstitial densities are noted which are improved compared to prior exam. This is in consistent with improving pulmonary edema. No pneumothorax or significant pleural effusion is noted.  IMPRESSION: Mildly improved bilateral pulmonary edema.   Electronically Signed   By: Roque Lias M.D.   On: 03/09/2014 13:49   Dg Chest Port 1 View  03/09/2014   CLINICAL DATA:  Reassess known airspace disease and support tube positioning  EXAM: PORTABLE CHEST - 1 VIEW  COMPARISON:  Portable chest x-ray of 08 March 2014  FINDINGS: The lungs are adequately inflated. Widespread airspace consolidation is present but may  have slightly improved since yesterday's study. The hemidiaphragms are obscured. There is no pneumothorax. The cardiac silhouette is mildly enlarged. The pulmonary vascularity is engorged.  The endotracheal tube tip lies 3 cm above the crotch of the carina. The left internal jugular venous catheter tip projects over the junction of the  proximal and midportions of the SVC. The esophagogastric tube tip projects off the inferior margin of the image.  IMPRESSION: 1. Stable to slightly improved appearance of the pulmonary interstitial edema. 2. The support tubes and lines are in reasonable position.   Electronically Signed   By: David  [place]   On: 03/09/2014 07:50   Dg Chest Port 1 View  03/08/2014   CLINICAL DATA:  Evaluate endotracheal tube position  EXAM: PORTABLE CHEST - 1 VIEW  COMPARISON:  Portable chest x-ray of 03/07/2014  FINDINGS: There has been worsening of airspace disease bilaterally which is primarily perihilar in location most consistent with pulmonary edema and probable effusions. Mild cardiomegaly is stable. The tip of the endotracheal tube is approximately 2.8 cm above the carina. Left IJ central venous catheter remains unchanged in position  IMPRESSION: 1. Worsening of pulmonary edema with probable effusions. 2. Tip of endotracheal tube approximately 2.8 cm above the carina.   Electronically Signed   By: Dwyane Dee M.D.   On: 03/08/2014 08:08   Portable Chest Xray In Am  03/07/2014   CLINICAL DATA:  Hypoxia  EXAM: PORTABLE CHEST - 1 VIEW  COMPARISON:  March 06, 2014  FINDINGS: Endotracheal tube tip is 2.5 cm above the carina. Central catheter tip is at the junction of the left innominate vein and superior vena cava. Nasogastric tube tip and side port are below the diaphragm. No pneumothorax. There is interstitial edema with cardiomegaly and pulmonary venous hypertension. There is a right effusion. There is a minimal left effusion as well. There is mild airspace consolidation in the left base. No adenopathy.  IMPRESSION: Tube and catheter positions as described without pneumothorax. Evidence of a degree of congestive heart failure. Question alveolar edema versus superimposed pneumonia left base.   Electronically Signed   By: Bretta Bang M.D.   On: 03/07/2014 07:23   Dg Chest Port 1 View  03/06/2014   CLINICAL  DATA:  Endotracheal tube and central line placement.  EXAM: PORTABLE CHEST - 1 VIEW  COMPARISON:  Same day.  FINDINGS: Endotracheal tube is in grossly good position with distal tip 4 cm above the carina. Left internal jugular catheter line is noted with distal tip in expected position of the SVC. No pneumothorax is noted. Stable bilateral perihilar and basilar opacities are noted concerning for edema or pneumonia.  IMPRESSION: No pneumothorax status post left internal jugular catheter line placement. Endotracheal tube in grossly good position. These results will be called to the ordering clinician or representative by the Radiologist Assistant, and communication documented in the PACS or zVision Dashboard.   Electronically Signed   By: Roque Lias M.D.   On: 03/06/2014 13:02   Dg Chest Port 1 View  03/06/2014   CLINICAL DATA:  Shortness of breath.  EXAM: PORTABLE CHEST - 1 VIEW  COMPARISON:  Chest radiograph performed 03/05/2014  FINDINGS: The lungs are well-aerated. Right basilar airspace opacity may reflect pneumonia or mild pulmonary edema. Underlying vascular congestion is noted. A small right pleural effusion is suspected. No pneumothorax is seen.  The cardiomediastinal silhouette is borderline enlarged. No acute osseous abnormalities are seen.  IMPRESSION: 1. Right basilar airspace opacity may reflect pneumonia  or mild pulmonary edema. Small right pleural effusion suspected. 2. Underlying vascular congestion and borderline cardiomegaly.   Electronically Signed   By: Roanna Raider M.D.   On: 03/06/2014 06:48   Dg Chest Portable 1 View  03/05/2014   CLINICAL DATA:  Leg pain.  Smoker.  EXAM: PORTABLE CHEST - 1 VIEW  COMPARISON:  Chest radiograph 09/25/2013  FINDINGS: Cardiac leads project over the chest. Cardiomediastinal silhouette is stable. There is atherosclerotic calcification in the thoracic aortic arch. Lung volumes are low resulting in crowding of pulmonary vascular markings. No airspace disease  or pulmonary edema is identified. No acute bony abnormality appreciated.  IMPRESSION: Low lung volumes.  No acute findings identified.  Atherosclerosis of the thoracic aorta.   Electronically Signed   By: Britta Mccreedy M.D.   On: 03/05/2014 19:15   Dg Abd Portable 1v  03/16/2014   CLINICAL DATA:  Feeding tube placement  EXAM: PORTABLE ABDOMEN - 1 VIEW  COMPARISON:  03/08/2014  FINDINGS: The weighted feeding tube is identified in the left upper quadrant of the abdomen. This is in the expected location of the body of stomach. Right upper quadrant surgical clips are noted. There is enteric contrast material opacifying the colon.  IMPRESSION: 1. Tip of the feeding tube is in the expected location of the body of stomach.   Electronically Signed   By: Signa Kell M.D.   On: 03/16/2014 19:23   Dg Abd Portable 1v  03/08/2014   CLINICAL DATA:  Orogastric tube placement  EXAM: PORTABLE ABDOMEN - 1 VIEW  COMPARISON:  None.  FINDINGS: Orogastric tube tip and side port are in the stomach. There is contrast in the colon. The bowel gas pattern is unremarkable. No obstruction or free air appreciable.  IMPRESSION: Orogastric tube tip and side port are in the stomach. Bowel gas pattern unremarkable.   Electronically Signed   By: Bretta Bang M.D.   On: 03/08/2014 16:09   Dg Abd Portable 1v  03/06/2014   CLINICAL DATA:  NG tube placement.  EXAM: PORTABLE ABDOMEN - 1 VIEW  COMPARISON:  03/06/2014  FINDINGS: The endotracheal tube is 2.7 cm above the carina. The left IJ catheter tip is in the mid SVC. The NG tube tip is in the stomach. Persistent edema and right lower lobe airspace process.  IMPRESSION: The NG tube is in the stomach.  ETT is 2.7 cm above the carina.  Left IJ catheter tip is in the mid SVC.   Electronically Signed   By: Loralie Champagne M.D.   On: 03/06/2014 15:43   Dg Foot Complete Left  03/05/2014   CLINICAL DATA:  Left foot pain. Soft tissue ulceration. Redness and discoloration.  EXAM: LEFT FOOT -  COMPLETE 3+ VIEW  COMPARISON:  08/10/2012  FINDINGS: There is no fracture or dislocation or bone destruction suggestive of osteomyelitis. There are 2 screws in the distal tibia and a side plate and multiple screws in the distal fibula. 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 Small plantar calcaneal spur.  IMPRESSION: No acute abnormalities.  No change since the prior exam.   Electronically Signed   By: Geanie Cooley M.D.   On: 03/05/2014 22:09    Scheduled Meds: . aspirin  81 mg Oral Daily  . atorvastatin  40 mg Oral q1800  . carvedilol  3.125 mg Oral BID WC  .  ceFAZolin (ANCEF) IV  2 g Intravenous 3 times per day  . digoxin  0.125 mg Oral Daily  .  doxycycline  100 mg Oral Q12H  . escitalopram  10 mg Oral Daily  . feeding supplement (PRO-STAT SUGAR FREE 64)  30 mL Per Tube QID  . furosemide  40 mg Oral BID  . gabapentin  300 mg Oral TID  . heparin subcutaneous  5,000 Units Subcutaneous 3 times per day  . insulin aspart  0-20 Units Subcutaneous 6 times per day  . insulin glargine  15 Units Subcutaneous Daily  . ipratropium-albuterol  3 mL Nebulization Q6H  . isosorbide mononitrate  30 mg Oral BID  . losartan  50 mg Oral Daily  . predniSONE  20 mg Oral Q breakfast   Continuous Infusions: . sodium chloride 10 mL/hr at 03/18/14 2200  . feeding supplement (JEVITY 1.2 CAL) 1,000 mL (03/19/14 0000)    Principal Problem:   Sepsis Active Problems:   Left leg pain   Anemia   Diabetes mellitus, controlled   NSTEMI (non-ST elevated myocardial infarction)   Acute respiratory failure   Acute systolic heart failure    Time spent: 40 minutes   [facility]  [facility] Pager [phone]. If 7PM-7AM, please contact night-coverage at [email or web address], password [facility] 03/19/2014, 11:45 AM  LOS: 14 days

## 2014-03-20 DIAGNOSIS — A409 Streptococcal sepsis, unspecified: Secondary | ICD-10-CM

## 2014-03-20 DIAGNOSIS — J9600 Acute respiratory failure, unspecified whether with hypoxia or hypercapnia: Secondary | ICD-10-CM

## 2014-03-20 DIAGNOSIS — I214 Non-ST elevation (NSTEMI) myocardial infarction: Secondary | ICD-10-CM

## 2014-03-20 DIAGNOSIS — I5023 Acute on chronic systolic (congestive) heart failure: Secondary | ICD-10-CM

## 2014-03-20 DIAGNOSIS — R5381 Other malaise: Secondary | ICD-10-CM

## 2014-03-20 LAB — COMPREHENSIVE METABOLIC PANEL
ALBUMIN: 2.3 g/dL — AB (ref 3.5–5.2)
ALK PHOS: 47 U/L (ref 39–117)
ALT: 16 U/L (ref 0–35)
ANION GAP: 9 (ref 5–15)
AST: 17 U/L (ref 0–37)
BUN: 17 mg/dL (ref 6–23)
CALCIUM: 7.9 mg/dL — AB (ref 8.4–10.5)
CO2: 29 mEq/L (ref 19–32)
Chloride: 106 mEq/L (ref 96–112)
Creatinine, Ser: 0.34 mg/dL — ABNORMAL LOW (ref 0.50–1.10)
GFR calc Af Amer: >90 mL/min (ref >90)
GFR calc non Af Amer: >90 mL/min (ref >90)
Glucose, Bld: 185 mg/dL — ABNORMAL HIGH (ref 70–99)
POTASSIUM: 3.7 meq/L (ref 3.7–5.3)
SODIUM: 144 meq/L (ref 137–147)
Total Bilirubin: 0.3 mg/dL (ref 0.3–1.2)
Total Protein: 5.5 g/dL — ABNORMAL LOW (ref 6.0–8.3)

## 2014-03-20 LAB — CULTURE, BLOOD (ROUTINE X 2): Culture: NO GROWTH

## 2014-03-20 LAB — GLUCOSE, CAPILLARY
GLUCOSE-CAPILLARY: 191 mg/dL — AB (ref 70–99)
GLUCOSE-CAPILLARY: 219 mg/dL — AB (ref 70–99)
GLUCOSE-CAPILLARY: 227 mg/dL — AB (ref 70–99)
Glucose-Capillary: 178 mg/dL — ABNORMAL HIGH (ref 70–99)
Glucose-Capillary: 203 mg/dL — ABNORMAL HIGH (ref 70–99)
Glucose-Capillary: 256 mg/dL — ABNORMAL HIGH (ref 70–99)

## 2014-03-20 LAB — CARBOXYHEMOGLOBIN
Carboxyhemoglobin: 1.6 % — ABNORMAL HIGH (ref 0.5–1.5)
Methemoglobin: 0.6 % (ref 0.0–1.5)
O2 SAT: 62.6 %
Total hemoglobin: 9.6 g/dL — ABNORMAL LOW (ref 12.0–16.0)

## 2014-03-20 MED ORDER — ATORVASTATIN CALCIUM 40 MG PO TABS
40.0000 mg | ORAL_TABLET | Freq: Every day | ORAL | Status: DC
  Administered 2014-03-21: 40 mg
  Filled 2014-03-20 (×3): qty 1

## 2014-03-20 MED ORDER — SPIRONOLACTONE 12.5 MG HALF TABLET
12.5000 mg | ORAL_TABLET | Freq: Every day | ORAL | Status: DC
  Filled 2014-03-20: qty 1

## 2014-03-20 MED ORDER — INSULIN GLARGINE 100 UNIT/ML ~~LOC~~ SOLN
20.0000 [IU] | Freq: Every day | SUBCUTANEOUS | Status: DC
  Administered 2014-03-21 – 2014-03-27 (×7): 20 [IU] via SUBCUTANEOUS
  Filled 2014-03-20 (×8): qty 0.2

## 2014-03-20 MED ORDER — ESCITALOPRAM OXALATE 10 MG PO TABS
10.0000 mg | ORAL_TABLET | Freq: Every day | ORAL | Status: DC
  Administered 2014-03-21 – 2014-03-23 (×3): 10 mg
  Filled 2014-03-20 (×3): qty 1

## 2014-03-20 MED ORDER — ONDANSETRON HCL 4 MG PO TABS: 4.0000 mg | ORAL_TABLET | Freq: Four times a day (QID) | ORAL | Status: DC | PRN

## 2014-03-20 MED ORDER — PREDNISONE 10 MG PO TABS
10.0000 mg | ORAL_TABLET | Freq: Every day | ORAL | Status: DC
  Administered 2014-03-21 – 2014-03-23 (×3): 10 mg
  Filled 2014-03-20 (×4): qty 1

## 2014-03-20 MED ORDER — LOSARTAN POTASSIUM 50 MG PO TABS
50.0000 mg | ORAL_TABLET | Freq: Every day | ORAL | Status: DC
  Administered 2014-03-21 – 2014-03-23 (×3): 50 mg
  Filled 2014-03-20 (×3): qty 1

## 2014-03-20 MED ORDER — CARVEDILOL 3.125 MG PO TABS
3.1250 mg | ORAL_TABLET | Freq: Two times a day (BID) | ORAL | Status: DC
  Administered 2014-03-20 – 2014-03-22 (×4): 3.125 mg
  Filled 2014-03-20 (×6): qty 1

## 2014-03-20 MED ORDER — PREDNISONE 20 MG PO TABS: 20.0000 mg | ORAL_TABLET | Freq: Every day | ORAL | Status: DC

## 2014-03-20 MED ORDER — DIGOXIN 125 MCG PO TABS
0.1250 mg | ORAL_TABLET | Freq: Every day | ORAL | Status: DC
  Administered 2014-03-21 – 2014-03-22 (×2): 0.125 mg
  Filled 2014-03-20 (×3): qty 1

## 2014-03-20 MED ORDER — FUROSEMIDE 40 MG PO TABS
40.0000 mg | ORAL_TABLET | Freq: Two times a day (BID) | ORAL | Status: DC
  Administered 2014-03-20 – 2014-03-23 (×5): 40 mg
  Filled 2014-03-20 (×8): qty 1

## 2014-03-20 MED ORDER — NITROGLYCERIN 0.4 MG/HR TD PT24
0.4000 mg | MEDICATED_PATCH | TRANSDERMAL | Status: DC
  Administered 2014-03-20 – 2014-03-26 (×6): 0.4 mg via TRANSDERMAL
  Filled 2014-03-20 (×8): qty 1

## 2014-03-20 MED ORDER — ACETAMINOPHEN 325 MG PO TABS
650.0000 mg | ORAL_TABLET | ORAL | Status: DC | PRN
  Administered 2014-03-23: 650 mg
  Filled 2014-03-20: qty 2

## 2014-03-20 MED ORDER — SPIRONOLACTONE 12.5 MG HALF TABLET
12.5000 mg | ORAL_TABLET | Freq: Every day | ORAL | Status: DC
  Administered 2014-03-20: 12.5 mg via ORAL
  Filled 2014-03-20: qty 1

## 2014-03-20 MED ORDER — ASPIRIN 81 MG PO CHEW
81.0000 mg | CHEWABLE_TABLET | Freq: Every day | ORAL | Status: DC
  Administered 2014-03-21 – 2014-03-23 (×3): 81 mg
  Filled 2014-03-20 (×3): qty 1

## 2014-03-20 MED ORDER — GABAPENTIN 250 MG/5ML PO SOLN
300.0000 mg | Freq: Three times a day (TID) | ORAL | Status: DC
  Administered 2014-03-21 – 2014-03-23 (×6): 300 mg
  Filled 2014-03-20 (×13): qty 6

## 2014-03-20 NOTE — Progress Notes (Signed)
PROGRESS NOTE  Subjective:    Seen by TCTS. Not CABG candidate. Over the weekend milrinone was discontinued. Co-ox 62% and renal function stable. Denies SOB, orthopnea or CP. Weight stable.    Objective:    Vital Signs:   Temp:  [97.9 F (36.6 C)-98.2 F (36.8 C)] 97.9 F (36.6 C) (09/28 0029) Pulse Rate:  [65-91] 65 (09/28 0530) Resp:  [17-25] 17 (09/28 0530) BP: (107-125)/(42-76) 107/42 mmHg (09/28 0530) SpO2:  [91 %-100 %] 95 % (09/28 0754) Weight:  [197 lb 5 oz (89.5 kg)] 197 lb 5 oz (89.5 kg) (09/28 0500)  Last BM Date: 03/18/14   24-hour weight change: Weight change: -1 lb 8.7 oz (-0.7 kg)  Weight trends: Filed Weights   03/18/14 0422 03/18/14 1819 03/20/14 0500  Weight: 196 lb 10.4 oz (89.2 kg) 198 lb 13.7 oz (90.2 kg) 197 lb 5 oz (89.5 kg)    Intake/Output:  09/27 0701 - 09/28 0700 In: -  Out: 2800 [Urine:2800]     Physical Exam: BP 107/42  Pulse 65  Temp(Src) 97.9 F (36.6 C) (Oral)  Resp 17  Ht  (1.575 m)  Wt 197 lb 5 oz (89.5 kg)  BMI 36.08 kg/m2  SpO2 95%  Wt Readings from Last 3 Encounters:  03/20/14 197 lb 5 oz (89.5 kg)  03/20/14 197 lb 5 oz (89.5 kg)  03/01/14 196 lb (88.905 kg)    General: Vital signs reviewed and noted. Chronically ill appearing, NAD  Head: Normocephalic, atraumatic.  Eyes: conjunctivae/corneas clear.  EOM's intact.   Throat: normal  Neck:  R IJ in place   Lungs:  Decreased BS throughout no wheezing  Heart:  RRR, no s3 2/6 TR  Abdomen:  Soft, non-tender, non-distended    Extremities:  Warm no edema. L DP non palpable and R 2+, no edema  Neurologic: Moves all 4  Psych: A&Ox3    Labs: BMET:  Recent Labs  03/18/14 1525 03/19/14 0630 03/19/14 0901 03/20/14 0500  NA  --   --  141 144  K  --   --  3.8 3.7  CL  --   --  102 106  CO2  --   --  28 29  GLUCOSE  --   --  226* 185*  BUN  --   --  16 17  CREATININE  --   --  0.34* 0.34*  CALCIUM  --   --  8.0* 7.9*  MG 2.1 2.0  --   --   PHOS 3.6  3.2  --   --     Liver function tests:  Recent Labs  03/20/14 0500  AST 17  ALT 16  ALKPHOS 47  BILITOT 0.3  PROT 5.5*  ALBUMIN 2.3*   No results found for this basename: LIPASE, AMYLASE,  in the last 72 hours  CBC:  Recent Labs  03/19/14 0630  WBC 6.4  HGB 9.5*  HCT 30.8*  MCV 73.9*  PLT 236    Cardiac Enzymes: No results found for this basename: CKTOTAL, CKMB, TROPONINI,  in the last 72 hours  Coagulation Studies: No results found for this basename: LABPROT, INR,  in the last 72 hours  Other: No components found with this basename: POCBNP,  No results found for this basename: DDIMER,  in the last 72 hours No results found for this basename: HGBA1C,  in the last 72 hours No results found for this basename: CHOL, HDL, LDLCALC, TRIG, CHOLHDL,  in  the last 72 hours No results found for this basename: TSH, T4TOTAL, FREET3, T3FREE, THYROIDAB,  in the last 72 hours No results found for this basename: VITAMINB12, FOLATE, FERRITIN, TIBC, IRON, RETICCTPCT,  in the last 72 hours   Other results:  EKG :  NSR at 93.  Old ant. MI  Medications:    Infusions: . sodium chloride 10 mL/hr at 03/18/14 2200  . feeding supplement (JEVITY 1.2 CAL) 1,000 mL (03/19/14 2029)    Scheduled Medications: . aspirin  81 mg Oral Daily  . atorvastatin  40 mg Oral q1800  . carvedilol  3.125 mg Oral BID WC  .  ceFAZolin (ANCEF) IV  2 g Intravenous 3 times per day  . digoxin  0.125 mg Oral Daily  . doxycycline  100 mg Oral Q12H  . escitalopram  10 mg Oral Daily  . feeding supplement (PRO-STAT SUGAR FREE 64)  30 mL Per Tube QID  . furosemide  40 mg Oral BID  . gabapentin  300 mg Oral TID  . heparin subcutaneous  5,000 Units Subcutaneous 3 times per day  . insulin aspart  0-20 Units Subcutaneous 6 times per day  . insulin glargine  15 Units Subcutaneous Daily  . ipratropium-albuterol  3 mL Nebulization Q6H  . isosorbide mononitrate  30 mg Oral BID  . losartan  50 mg Oral Daily  .  predniSONE  20 mg Oral Q breakfast    Assessment/ Plan:    1. NSTEMI with severe three-vessel coronary artery disease - not amenable to PCI 2. Acute on chronic systolic congestive heart failure with an EF of 10-15% 3. Acute respiratory failure 4. Fever/leukocytosis    --MSSA bacteremia last week 5. Diabetes mellitus 6. COPD 7. Peripheral vascular disease -status post right iliac stent, status post fem - tibioperoneal trunk. L foot ulcerations 8. Hypernatremia 9. Iron-def anemia   Stable overnight. Renal function remains stable and CVP 4 will continue IV lasix currently with pro-BNP still being markedly elevated. Weight down 1 lb and 24 hr I/O -2.8 liters.  Co-ox 62% off milrinone. She is on low dose BB with recent acute HF and will continue to hold. Will continue losartan 50 mg daily and add low dose spiro 12.5 mg daily.   Will need 3 weeks of cefazolin for recent MSSA bacteremia.   Check dig level tomorrow.   OOB today and work with PT.   Aundria Rud, NP-C 9:08 AM  Agree with above.  [employer]

## 2014-03-20 NOTE — Progress Notes (Signed)
[place] TEAM 1 - Stepdown/ICU TEAM Progress Note  [name] ZOX:[id number] DOB: [birth year] DOA: 03/05/2014 PCP: Kirk Ruths, MD  Admit HPI / Brief Narrative: 58 yo female with history of peripheral vascular disease who had right lower extremity runoff 03/01/2014 presented to the ER 9/13 because of worsening left lower extremity pain over 2 days. In the ER patient was seen by Vascular Surgeon Dr. Imogene Burn who did not feel the patient's left lower extremity pain was vascular in origin. In the ER patient was found to be hypotensive and febrile. Blood cultures were obtained with urine cultures. Chest x-ray and UA were unremarkable. On exam patient had mild discharge from the right groin wound which patient stated had been there for last 6 weeks. Patient also had mild skin discoloration in the left groin area where she had catheterization.   She went on to develop positive blood cultures and also Pseudomonas was cultured from her groin wound. She subsequently developed probable aspiration pneumonia then elevated cardiac enzymes. She had severe LV dysfunction on her echocardiogram as well as a cardiac cath noting multivessel CAD, but the vessels were poor targets for grafting. The patient was intubated for 10 days and extubated. She had evidence of persistent pleural effusions from heart failure. A recent blood culture was positive one out of 2 samples. With her sepsis, heart failure, ischemic cardiomyopathy with poor targets, and general deconditioning, malnutrition, and peripheral vascular disease she would not be candidate for CABG. She has had her saphenous vein harvested from her right leg for femoropopliteal bypass and the left leg has evidence of severe venous stasis and probably poor quality conduit.   Significant Events:  9/13 PM - admitted  9/14 hypoxia, hypotension, +Trop I - intubated  9/15 pos BC MSSA  9/16 TEE > see below 9/17 - neg 2 liters, still bronchospastic 9/19 extubated    9/21 acute hypoxemic respiratory failure, re-intubated, cardiology started milrninone  9/23 thora left side 500c removed, extubated  9/24 failed swallow evaluation; TCTS consult > not a CABG candidate, rec medical management, transfused 1 U PRBC 9/26 TRH assumed care  HPI/Subjective: Pt feels she is slowly improving.  She c/o ongoing pain in both of her legs, and a sense of profound weakness.  She denies cp or sob.    Assessment/Plan:  Acute hypoxic resp failure due to ischemia and flash edema  Resolved - extubated  Bilateral effusions L >R > transudate only 500cc removed 9/23 from left - no respiratory distress at this time   NSTEMI with severe three-vessel CAD - not amenable to PCI > not a CABG candidate  Care as per Cardiology - clinically stable at this time  Ischemic cardiomyopathy / Acute on chronic systolic congestive heart failure with an EF of 10-15%  Care as per CHF Team   Septic shock resolved   MSSA bacteremia 2/2 blood cx  after recent angiogram but source uncertain - TEE negative, cultures now clear - cefazolin, plan 3 weeks total per ID (stop date 03/25/2014) - repeat blood cx 9/22 remarkable for 1/2 coag negative Staph, c/w a contaminant   ?HCAP 9/22 > doubt quantitative culture negative from BAL   Pseudomonas R groin wound   Tobacco abuse   COPD w/ acute exacerbation   Hypophosphatemia F/u in AM  Hypokalemia  F/u in AM   Chronic anemia  Recheck CBC in AM   L inguinal hematoma without evidence of bleeding  DM  CBG not at goal - adjust tx and follow  trend  Code Status: FULL Family Communication: spoke w/ pt and son at bedside - filled out short term disability papers  Disposition Plan: SDU  Consultants: CHF Team TCTS PCCM > TRH  Procedures: 9/14 Echo- EF 30-35%  9/16 TEE- no LAA thrombus, no valvular vegetation, anterior, anteroseptal, apical, and inferioapical hypokinesis, EF 35%  9/17 EKG- sinus tach, low voltage QRS, cannot r/o  anteroseptal infarct  9/18 LHC- 40% distal left main, severe diffuse disease from LAD, 95% stenosis circ, focal 75% mid RCA, LVEF 15%  9/21 L groin vascular ultrasound >>> no evidence of complication around graft site   Antibiotics: Vanc 9/14 > 9/17 + 9/22 > 9/25  Cefepime 9/14 > 9/23  Ancef 9/21 + 9/25 > Doxycycline 9/24 >   DVT prophylaxis: SQ heparin   Objective: Blood pressure 96/69, pulse 65, temperature 98.5 F (36.9 C), temperature source Oral, resp. rate 17, height  (1.575 m), weight 89.5 kg (197 lb 5 oz), SpO2 95.00%.  Intake/Output Summary (Last 24 hours) at 03/20/14 1346 Last data filed at 03/20/14 1300  Gross per 24 hour  Intake   1815 ml  Output   2000 ml  Net   -185 ml   Exam: General: No acute respiratory distress Lungs: Clear to auscultation bilaterally without wheezes or crackles Cardiovascular: Regular rate and rhythm without murmur gallop or rub Abdomen: obese, nontender, nondistended, soft, bowel sounds positive, no rebound, no ascites, no appreciable mass Extremities: No significant cyanosis, clubbing;  1+ edema bilateral lower extremities  Data Reviewed: Basic Metabolic Panel:  Recent Labs Lab 03/16/14 0500  03/17/14 0400 03/17/14 1505 03/18/14 0355 03/18/14 1525 03/19/14 0630 03/19/14 0901 03/20/14 0500  NA 145  --  146  --  141  --   --  141 144  K 3.8  --  4.6  --  3.9  --   --  3.8 3.7  CL 107  --  109  --  104  --   --  102 106  CO2 30  --  28  --  29  --   --  28 29  GLUCOSE 127*  --  209*  --  207*  --   --  226* 185*  BUN 18  --  16  --  17  --   --  16 17  CREATININE 0.36*  --  0.37*  --  0.37*  --   --  0.34* 0.34*  CALCIUM 7.9*  --  8.1*  --  8.0*  --   --  8.0* 7.9*  MG  --   < > 2.1 2.0 2.0 2.1 2.0  --   --   PHOS  --   < > 3.0 3.0 4.3 3.6 3.2  --   --   < > = values in this interval not displayed.  Liver Function Tests:  Recent Labs Lab 03/15/14 1830 03/20/14 0500  AST  --  17  ALT  --  16  ALKPHOS  --  47    BILITOT  --  0.3  PROT 5.9* 5.5*  ALBUMIN  --  2.3*   CBC:  Recent Labs Lab 03/15/14 0430 03/15/14 1000 03/16/14 0500 03/17/14 0400 03/19/14 0630  WBC 11.9* 13.1* 8.7 6.3 6.4  NEUTROABS 9.5* 11.4*  --   --   --   HGB 7.9* 8.1* 7.7* 9.1* 9.5*  HCT 27.2* 28.4* 26.0* 29.7* 30.8*  MCV 76.0* 75.9* 75.1* 74.1* 73.9*  PLT 229 231 198 196 236  Cardiac Enzymes:  Recent Labs Lab 03/13/14 1842 03/15/14 1000  TROPONINI 1.78* 0.58*   BNP (last 3 results)  Recent Labs  03/06/14 1015 03/19/14 0630  PROBNP 8842.0* 4761.0*    CBG:  Recent Labs Lab 03/19/14 2026 03/20/14 0047 03/20/14 0510 03/20/14 0929 03/20/14 1205  GLUCAP 182* 178* 191* 256* 203*    Recent Results (from the past 240 hour(s))  CULTURE, BLOOD (ROUTINE X 2)     Status: None   Collection Time    03/14/14 11:06 AM      Result Value Ref Range Status   Specimen Description BLOOD LEFT HAND   Final   Special Requests BOTTLES DRAWN AEROBIC AND ANAEROBIC 10CC   Final   Culture  Setup Time     Final   Value: 03/14/2014 16:45     Performed at [facility]   Culture     Final   Value: STAPHYLOCOCCUS SPECIES (COAGULASE NEGATIVE)     Note: THE SIGNIFICANCE OF ISOLATING THIS ORGANISM FROM A SINGLE SET OF BLOOD CULTURES WHEN MULTIPLE SETS ARE DRAWN IS UNCERTAIN. PLEASE NOTIFY THE MICROBIOLOGY DEPARTMENT WITHIN ONE WEEK IF SPECIATION AND SENSITIVITIES ARE REQUIRED.     Note: Gram Stain Report Called to,Read Back By and Verified With: MONICA CROSS TAKEN FOR NURSE 12:55AM 03/16/14 THOMI     Performed at [facility]   Report Status 03/17/2014 FINAL   Final  CULTURE, BLOOD (ROUTINE X 2)     Status: None   Collection Time    03/14/14 11:16 AM      Result Value Ref Range Status   Specimen Description BLOOD RIGHT HAND   Final   Special Requests BOTTLES DRAWN AEROBIC AND ANAEROBIC 10CC   Final   Culture  Setup Time     Final   Value: 03/14/2014 16:43     Performed at [facility]   Culture      Final   Value: NO GROWTH 5 DAYS     Performed at [facility]   Report Status 03/20/2014 FINAL   Final  CULTURE, BAL-QUANTITATIVE     Status: None   Collection Time    03/14/14 12:47 PM      Result Value Ref Range Status   Specimen Description BRONCHIAL ALVEOLAR LAVAGE   Final   Special Requests Normal   Final   Gram Stain     Final   Value: FEW WBC PRESENT,BOTH PMN AND MONONUCLEAR     RARE SQUAMOUS EPITHELIAL CELLS PRESENT     NO ORGANISMS SEEN     Performed at [facility] Count     Final   Value: 10,000 COLONIES/ML     Performed at [facility]   Culture     Final   Value: Non-Pathogenic Oropharyngeal-type Flora Isolated.     Performed at [facility]   Report Status 03/16/2014 FINAL   Final  AFB CULTURE WITH SMEAR     Status: None   Collection Time    03/15/14  2:33 PM      Result Value Ref Range Status   Specimen Description FLUID LEFT PLEURAL   Final   Special Requests NONE   Final   Acid Fast Smear     Final   Value: NO ACID FAST BACILLI SEEN     Performed at [facility]   Culture     Final   Value: CULTURE WILL BE EXAMINED FOR 6 WEEKS BEFORE ISSUING A  FINAL REPORT     Performed at [facility]   Report Status PENDING   Incomplete  BODY FLUID CULTURE     Status: None   Collection Time    03/15/14  2:33 PM      Result Value Ref Range Status   Specimen Description FLUID LEFT PLEURAL   Final   Special Requests NONE   Final   Gram Stain     Final   Value: RARE WBC PRESENT, PREDOMINANTLY MONONUCLEAR     NO ORGANISMS SEEN     Performed at [facility]   Culture     Final   Value: NO GROWTH 3 DAYS     Performed at [facility]   Report Status 03/19/2014 FINAL   Final  FUNGUS CULTURE W SMEAR     Status: None   Collection Time    03/15/14  2:33 PM      Result Value Ref Range Status   Specimen Description PLEURAL LEFT   Final   Special Requests NONE   Final   Fungal Smear     Final    Value: NO YEAST OR FUNGAL ELEMENTS SEEN     Performed at [facility]   Culture     Final   Value: CULTURE IN PROGRESS FOR FOUR WEEKS     Performed at [facility]   Report Status PENDING   Incomplete     Studies:  Recent x-ray studies have been reviewed in detail by the Attending Physician  Scheduled Meds:  Scheduled Meds: . aspirin  81 mg Oral Daily  . atorvastatin  40 mg Oral q1800  . carvedilol  3.125 mg Oral BID WC  .  ceFAZolin (ANCEF) IV  2 g Intravenous 3 times per day  . digoxin  0.125 mg Oral Daily  . doxycycline  100 mg Oral Q12H  . escitalopram  10 mg Oral Daily  . feeding supplement (PRO-STAT SUGAR FREE 64)  30 mL Per Tube QID  . furosemide  40 mg Oral BID  . gabapentin  300 mg Oral TID  . heparin subcutaneous  5,000 Units Subcutaneous 3 times per day  . insulin aspart  0-20 Units Subcutaneous 6 times per day  . insulin glargine  15 Units Subcutaneous Daily  . ipratropium-albuterol  3 mL Nebulization Q6H  . losartan  50 mg Oral Daily  . predniSONE  20 mg Oral Q breakfast  . spironolactone  12.5 mg Oral Daily    Time spent on care of this patient: 35 mins   Fitzroy Mikami T , MD   [facility] Office  [phone] Pager - Text Page per Loretha Stapler as per below:  On-Call/Text Page:      [email or web address]      password [email or web address]  If 7PM-7AM, please contact night-coverage [email or web address] Password [email or web address] 03/20/2014, 1:46 PM   LOS: 15 days

## 2014-03-20 NOTE — Progress Notes (Signed)
PT Cancellation Note  Patient Details Name: [name] MRN: [id number] DOB: 28-Jul-1955   Cancelled Treatment:    Reason Eval/Treat Not Completed: Pt just returned to bed by nursing after assisted fall. PT deferred at this time. Will return later this date or 03/21/14   Shylie Polo 03/20/2014, 3:07 PM Pager [phone]

## 2014-03-20 NOTE — Consult Note (Signed)
Physical Medicine and Rehabilitation Consult Reason for Consult: Deconditioning due to sepsis.  Referring Physician: Dr. Susie Cassette.    HPI: [name] is a 58 y.o. 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 She was admitted on 03/05/14 with hypotension, tachycardia,  fever of 101.9, leucocytosis and significant Left flank swelling. She was started on IV antibiotics for sepsis and was found to have left inguinal hematoma on CT. Patient was noted to have positive troponin's due to NSTEMI and started on IV heparin with serial CBC for monitoring.  Right heel culture positive for pseudomonas and BC X 2 positive for MSSA. TEE negative for thrombus or vegetations.  Hospital course complicated by respiratory failure requiring intubation on 09/14 to 9/19 and reintubation 09/21 to 9/23. She underwent cardiac cath on 09/18 by Dr. Eldridge Dace revealing severe 3 vessel CAD with EF 10-15%. She was not felt to be a surgical candidate per Dr. Donata Clay. CAD treated medically and acute on chronic systolic CHF treated with diuretics with clinical improvement. Left transudate effusion tapped for 500 cc clear fluid.  Speech therapy following for treatment of dysphagia with s/s of aspiration. MBS done on 09/27 revealing severe pharyngeal dysphagia and NPO recommended due to silent aspiration. PT evaluation done on 09/25 and patient noted to be severely deconditioned. MD and therapy team recommending CIR for progressive therapy.     Review of Systems  Eyes: Negative for blurred vision and double vision.  Respiratory: Negative for cough, shortness of breath and wheezing.   Cardiovascular: Negative for chest pain and palpitations.  Gastrointestinal: Negative for heartburn and abdominal pain.  Musculoskeletal: Negative for myalgias.  Neurological: Positive for weakness.  Negative for dizziness and tingling.  Psychiatric/Behavioral: The patient is not nervous/anxious and does not have insomnia.      Past Medical History  Diagnosis Date  . Diabetes mellitus   . Peripheral vascular disease   . Hyperlipidemia   . Obesity   . Hypertension   . Arthritis   . Ulcer of ankle     right  . Coronary artery disease   . COPD (chronic obstructive pulmonary disease)   . NSTEMI (non-ST elevated myocardial infarction)     Past Surgical History  Procedure Laterality Date  . Cholecystectomy    . Femoral-tibial bypass graft Right 09/27/2013    Procedure: BYPASS GRAFT FEMORAL-TIBIAL ARTERY-RIGHT;  Surgeon: Nada Libman, MD;  Location: [facility] OR;  Service: Vascular;  Laterality: Right;  . Abdominal aorta stent Right   . Bypass graft popliteal to tibial Right   . Coronary angioplasty with stent placement      proximcal RCA stent ~ 2001  . Amputation Right 10/14/2013    Procedure: AMPUTATION RAY;  Surgeon: Nadara Mustard, MD;  Location: [facility] OR;  Service: Orthopedics;  Laterality: Right;  Right 2nd Ray Amputation, Theraskin Graft Right Ankle    Family History  Problem Relation Age of Onset  . Cancer Mother   . Heart disease Father   . Hyperlipidemia Father   . Hypertension Father   . Other Father     varicose veins  . Heart attack Father   . Heart disease Sister   . Hyperlipidemia Sister   . Hypertension Sister   . Other Sister     varicose veins  . Heart attack Sister   .  Heart disease Brother   . Hypertension Brother   . Hyperlipidemia Brother   . Other Brother     varicose veins  . Heart attack Brother     Social History:  Lives alone. Independent and working full time for [employer]. Son lives next door and has a supportive family that can check in past discharge.   She reports that she has been smoking Cigarettes--1 PPD.  She has a 30 pack-year smoking history. She has never used smokeless tobacco. She reports that she does not drink alcohol or use  illicit drugs.   Allergies  Allergen Reactions  . Penicillins Hives    Tolerates cefepime  . Adhesive [Tape] Rash    Medications Prior to Admission  Medication Sig Dispense Refill  . alprazolam (XANAX) 2 MG tablet Take 1 mg by mouth every 6 (six) hours as needed for sleep or anxiety.       Marland Kitchen aspirin EC 325 MG tablet Take 325 mg by mouth daily.      Marland Kitchen atorvastatin (LIPITOR) 20 MG tablet Take 20 mg by mouth daily.      Marland Kitchen escitalopram (LEXAPRO) 10 MG tablet Take 10 mg by mouth daily.      . fentaNYL (DURAGESIC - DOSED MCG/HR) 100 MCG/HR Place 100 mcg onto the skin every 3 (three) days.      . fluticasone (FLONASE) 50 MCG/ACT nasal spray Place 1 spray into both nostrils daily as needed for allergies.       Marland Kitchen gabapentin (NEURONTIN) 600 MG tablet Take 600 mg by mouth 3 (three) times daily.      Marland Kitchen glimepiride (AMARYL) 2 MG tablet Take 2 mg by mouth 2 (two) times daily.      . hydrochlorothiazide (MICROZIDE) 12.5 MG capsule Take 12.5 mg by mouth daily.      . meloxicam (MOBIC) 15 MG tablet Take 15 mg by mouth daily.      . metFORMIN (GLUCOPHAGE) 500 MG tablet Take 1,000 mg by mouth 2 (two) times daily with a meal.      . oxycodone (ROXICODONE) 30 MG immediate release tablet Take 30 mg by mouth every 4 (four) hours as needed for pain.       Marland Kitchen quinapril (ACCUPRIL) 20 MG tablet Take 20 mg by mouth daily.      . VESICARE 5 MG tablet Take 5 mg by mouth daily.         Home: Home Living Family/patient expects to be discharged to:: Private residence Living Arrangements: Alone;Children Available Help at Discharge: Family;Available 24 hours/day Type of Home: House Home Access: Level entry Home Layout: One level Home Equipment: Walker - 2 wheels;Shower seat;Cane - single point;Adaptive equipment Adaptive Equipment: Reacher  Functional History: Prior Function Level of Independence: Independent Functional Status:  Mobility: Bed Mobility Overal bed mobility: Needs Assistance;+2 for physical  assistance Bed Mobility: Supine to Sit Supine to sit: Mod assist;+2 for physical assistance General bed mobility comments: Pt needed assist to move LEs off bed and for elevation of trunk. Transfers Overall transfer level: Needs assistance Equipment used: 2 person hand held assist Transfers: Sit to/from [employer] Sit to Stand: Mod assist;+2 physical assistance Stand pivot transfers: Mod assist;+2 physical assistance General transfer comment: Needed assist with pad to achieve stand and pt unable to achieve full upright.  Took two attempts to stand enough to pivot to chair.  Pt with weak hip extensors and trunk extension.        ADL:    Cognition:  Cognition Overall Cognitive Status: Within Functional Limits for tasks assessed Orientation Level: Oriented X4 Cognition Arousal/Alertness: Awake/alert Behavior During Therapy: WFL for tasks assessed/performed Overall Cognitive Status: Within Functional Limits for tasks assessed  Blood pressure 107/42, pulse 65, temperature 97.9 F (36.6 C), temperature source Oral, resp. rate 17, height 5\' 2"  (1.575 m), weight 89.5 kg (197 lb 5 oz), SpO2 95.00%. Physical Exam  Nursing note and vitals reviewed. Constitutional: She is oriented to person, place, and time. She appears well-developed and well-nourished. She has a sickly appearance. Nasal cannula in place.  Panda in place  HENT:  Head: Normocephalic and atraumatic.  Eyes: Pupils are equal, round, and reactive to light.  Neck: Neck supple.  Central line left neck.   Cardiovascular: Normal rate.   Respiratory: Effort normal and breath sounds normal. No respiratory distress. She has no wheezes.  GI: Soft. Bowel sounds are normal.  Musculoskeletal: She exhibits no edema and no tenderness.  Neurological: She is alert and oriented to person, place, and time.  Strong voice but dysphonia noted. Follows basic commands without difficulty.    Skin: Skin is warm and dry.  Dry  ulcerated areas bilateral great toes. Right lateral malleolus with stage 2 ulcer.   Psychiatric: She has a normal mood and affect. Her behavior is normal. Judgment and thought content normal.    Results for orders placed during the hospital encounter of 03/05/14 (from the past 24 hour(s))  BASIC METABOLIC PANEL     Status: Abnormal   Collection Time    03/19/14  9:01 AM      Result Value Ref Range   Sodium 141  137 - 147 mEq/L   Potassium 3.8  3.7 - 5.3 mEq/L   Chloride 102  96 - 112 mEq/L   CO2 28  19 - 32 mEq/L   Glucose, Bld 226 (*) 70 - 99 mg/dL   BUN 16  6 - 23 mg/dL   Creatinine, Ser 1.61 (*) 0.50 - 1.10 mg/dL   Calcium 8.0 (*) 8.4 - 10.5 mg/dL   GFR calc non Af Amer >90  >90 mL/min   GFR calc Af Amer >90  >90 mL/min   Anion gap 11  5 - 15  GLUCOSE, CAPILLARY     Status: Abnormal   Collection Time    03/19/14  9:10 AM      Result Value Ref Range   Glucose-Capillary 234 (*) 70 - 99 mg/dL  GLUCOSE, CAPILLARY     Status: Abnormal   Collection Time    03/19/14  2:02 PM      Result Value Ref Range   Glucose-Capillary 282 (*) 70 - 99 mg/dL  GLUCOSE, CAPILLARY     Status: Abnormal   Collection Time    03/19/14  5:12 PM      Result Value Ref Range   Glucose-Capillary 267 (*) 70 - 99 mg/dL  GLUCOSE, CAPILLARY     Status: Abnormal   Collection Time    03/19/14  8:26 PM      Result Value Ref Range   Glucose-Capillary 182 (*) 70 - 99 mg/dL  GLUCOSE, CAPILLARY     Status: Abnormal   Collection Time    03/20/14 12:47 AM      Result Value Ref Range   Glucose-Capillary 178 (*) 70 - 99 mg/dL  COMPREHENSIVE METABOLIC PANEL     Status: Abnormal   Collection Time    03/20/14  5:00 AM      Result Value Ref Range  Sodium 144  137 - 147 mEq/L   Potassium 3.7  3.7 - 5.3 mEq/L   Chloride 106  96 - 112 mEq/L   CO2 29  19 - 32 mEq/L   Glucose, Bld 185 (*) 70 - 99 mg/dL   BUN 17  6 - 23 mg/dL   Creatinine, Ser 1.61 (*) 0.50 - 1.10 mg/dL   Calcium 7.9 (*) 8.4 - 10.5 mg/dL   Total  Protein 5.5 (*) 6.0 - 8.3 g/dL   Albumin 2.3 (*) 3.5 - 5.2 g/dL   AST 17  0 - 37 U/L   ALT 16  0 - 35 U/L   Alkaline Phosphatase 47  39 - 117 U/L   Total Bilirubin 0.3  0.3 - 1.2 mg/dL   GFR calc non Af Amer >90  >90 mL/min   GFR calc Af Amer >90  >90 mL/min   Anion gap 9  5 - 15  CARBOXYHEMOGLOBIN     Status: Abnormal   Collection Time    03/20/14  5:10 AM      Result Value Ref Range   Total hemoglobin 9.6 (*) 12.0 - 16.0 g/dL   O2 Saturation 09.6     Carboxyhemoglobin 1.6 (*) 0.5 - 1.5 %   Methemoglobin 0.6  0.0 - 1.5 %  GLUCOSE, CAPILLARY     Status: Abnormal   Collection Time    03/20/14  5:10 AM      Result Value Ref Range   Glucose-Capillary 191 (*) 70 - 99 mg/dL   Dg Swallowing Func-speech Pathology  03/19/2014   [name], CCC-SLP     03/19/2014  4:52 PM Objective Swallowing Evaluation: Modified Barium Swallowing Study   Patient Details  Name: [name] MRN: [id number] Date of Birth: 06-06-1956  Today's Date: 03/19/2014 Time: 1530-1600 SLP Time Calculation (min): 30 min  Past Medical History:  Past Medical History  Diagnosis Date  . Diabetes mellitus   . Peripheral vascular disease   . Hyperlipidemia   . Obesity   . Hypertension   . Arthritis   . Ulcer of ankle     right  . Coronary artery disease   . COPD (chronic obstructive pulmonary disease)   . NSTEMI (non-ST elevated myocardial infarction)    Past Surgical History:  Past Surgical History  Procedure Laterality Date  . Cholecystectomy    . Femoral-tibial bypass graft Right 09/27/2013    Procedure: BYPASS GRAFT FEMORAL-TIBIAL ARTERY-RIGHT;  Surgeon:  Nada Libman, MD;  Location: [facility] OR;  Service: Vascular;   Laterality: Right;  . Abdominal aorta stent Right   . Bypass graft popliteal to tibial Right   . Coronary angioplasty with stent placement      proximcal RCA stent ~ 2001  . Amputation Right 10/14/2013    Procedure: AMPUTATION RAY;  Surgeon: Nadara Mustard, MD;   Location: [facility] OR;  Service: Orthopedics;  Laterality:  Right;   Right 2nd Ray Amputation, Theraskin Graft Right Ankle   HPI:  58 year old female with h/o COPD, DM, peripheral vascular  disease, admitted worsening left lower extremity pain and  and  abdominal pain, developed acute hypoxic respiratory failure due  to pulmonary edema requiring intubation 9/14-9/21, extubated and  reintubated 9/21-9/23, NSTEMI, MSSA bacteremia after recent  angiogram. FEES on 9/24 revealed severe acute reversible  dysphagia characterized by intubation related sensory deficits,  generalized weakness, and moderately severe laryngeal and  pharyngeal edema resulting in a delayed swallow initiation and  decreased laryngeal closure.  Deep penetration and aspiration with  inconsistent sensation of all consistencies.  NG placed.  MBS  today to determine any potential for conservative POs.       Assessment / Plan / Recommendation Clinical Impression  Dysphagia Diagnosis: Severe pharyngeal phase dysphagia Clinical impression: Pt presents with persisting severe  pharyngeal phase dysphagia related primarily to impaired  laryngeal function during the swallow.  Pt continued to present  with sensory deficits, delayed onset of swallow response, and  penetration of nectar and honey-thick liquids posteriorly into  larynx.  These consistencies were then aspirated and coursed down  posterior tracheal wall.  Occasionally, a cough response was  elicited, but aspiration was generally silent.  Purees were not  observed to enter larynx; however, shoulder position obfuscated  view during portion of study. Pt with marginal gains but is not  yet safe to begin a PO diet.   Recommend: 1) continue NPO with NG feeds; 2) begin therapeutic  trials of purees with SLP only;  3) therapeutic exercise to target laryngeal strengthening; 4)  repeat FEES in 3-5 days.      Treatment Recommendation  Therapy as outlined in treatment plan below    Diet Recommendation NPO;Alternative means - temporary;Ice chips  PRN after oral care    Medication Administration: Via alternative means    Other  Recommendations Oral Care Recommendations: Oral care prior  to ice chips   Follow Up Recommendations       Frequency and Duration min 3x week  2 weeks       SLP Swallow Goals     General HPI: 58 year old female with h/o COPD, DM, peripheral  vascular disease, admitted worsening left lower extremity pain  and  and abdominal pain, developed acute hypoxic respiratory  failure due to pulmonary edema requiring intubation 9/14-9/21,  extubated and reintubated 9/21-9/23, NSTEMI, MSSA bacteremia  after recent angiogram. FEES on 9/24 revealed severe acute  reversible dysphagia characterized by intubation related sensory  deficits, generalized weakness, and moderately severe laryngeal  and pharyngeal edema resulting in a delayed swallow initiation  and decreased laryngeal closure. Deep penetration and aspiration  with inconsistent sensation of all consistencies.  NG placed.   MBS today to determine any potential for conservative POs.   Type of Study: Modified Barium Swallowing Study Reason for Referral: Objectively evaluate swallowing function Previous Swallow Assessment: see HPI Diet Prior to this Study: NPO Temperature Spikes Noted: No Respiratory Status: Nasal cannula History of Recent Intubation: Yes Length of Intubations (days): 7 days Date extubated: 03/15/14 Behavior/Cognition: Alert;Cooperative;Pleasant mood Oral Cavity - Dentition: Adequate natural dentition Oral Motor / Sensory Function: Within functional limits Self-Feeding Abilities: Able to feed self Patient Positioning: Upright in chair Baseline Vocal Quality: Hoarse;Breathy;Low vocal intensity Volitional Cough: Weak Volitional Swallow: Able to elicit Anatomy: Within functional limits (edema visualized on prior  FEES; MBS unable to identify) Pharyngeal Secretions: Not observed secondary MBS    Reason for Referral Objectively evaluate swallowing function   Oral Phase Oral Preparation/Oral Phase Oral  Phase: Impaired Oral - Honey Oral - Honey Cup: Lingual pumping Oral - Nectar Oral - Nectar Cup: Lingual pumping Oral - Solids Oral - Puree: Lingual pumping   Pharyngeal Phase Pharyngeal Phase Pharyngeal Phase: Impaired Pharyngeal - Honey Pharyngeal - Honey Teaspoon: Delayed swallow  initiation;Pharyngeal residue - valleculae;Pharyngeal residue -  pyriform sinuses;Trace aspiration;Reduced airway/laryngeal  closure;Premature spillage to pyriform  sinuses;Penetration/Aspiration during swallow Penetration/Aspiration details (honey teaspoon): Material enters  airway, passes BELOW cords without attempt by patient to  eject  out (silent aspiration) Pharyngeal - Honey Cup: Delayed swallow initiation;Pharyngeal  residue - valleculae;Pharyngeal residue - pyriform sinuses;Trace  aspiration;Reduced airway/laryngeal closure;Premature spillage to  pyriform sinuses;Penetration/Aspiration during swallow Penetration/Aspiration details (honey cup): Material enters  airway, passes BELOW cords without attempt by patient to eject  out (silent aspiration) Pharyngeal - Nectar Pharyngeal - Nectar Teaspoon: Not tested Pharyngeal - Nectar Cup: Delayed swallow initiation;Pharyngeal  residue - valleculae;Pharyngeal residue - pyriform sinuses;Trace  aspiration;Reduced airway/laryngeal closure;Premature spillage to  pyriform sinuses;Penetration/Aspiration during swallow Penetration/Aspiration details (nectar cup): Material enters  airway, passes BELOW cords without attempt by patient to eject  out (silent aspiration) Pharyngeal - Thin Pharyngeal - Ice Chips: Not tested Pharyngeal - Solids Pharyngeal - Puree: Delayed swallow initiation;Pharyngeal residue  - valleculae;Pharyngeal residue - pyriform sinuses;Premature  spillage to valleculae Penetration/Aspiration details (puree): Material enters airway,  remains ABOVE vocal cords and not ejected out  Cervical Esophageal Phase   [name] [place], [place] CCC/SLP Pager [phone]               [name] 03/19/2014, 4:50 PM     Assessment/Plan: Diagnosis: deconditioning after multiple medical issues/sepsis/respiratory failure 1. Does the need for close, 24 hr/day medical supervision in concert with the patient's rehab needs make it unreasonable for this patient to be served in a less intensive setting? Yes 2. Co-Morbidities requiring supervision/potential complications: DM, wound care, dysphagia 3. Due to bladder management, bowel management, safety, skin/wound care, disease management, medication administration, pain management and patient education, does the patient require 24 hr/day rehab nursing? Yes 4. Does the patient require coordinated care of a physician, rehab nurse, PT (1-2 hrs/day, 5 days/week), OT (1-2 hrs/day, 5 days/week) and SLP (1-2 hrs/day, 5 days/week) to address physical and functional deficits in the context of the above medical diagnosis(es)? Yes Addressing deficits in the following areas: balance, endurance, locomotion, strength, transferring, bowel/bladder control, bathing, dressing, feeding, grooming and toileting 5. Can the patient actively participate in an intensive therapy program of at least 3 hrs of therapy per day at least 5 days per week? Yes 6. The potential for patient to make measurable gains while on inpatient rehab is excellent 7. Anticipated functional outcomes upon discharge from inpatient rehab are modified independent and supervision  with PT, modified independent and supervision with OT, modified independent and supervision with SLP. 8. Estimated rehab length of stay to reach the above functional goals is: 10-14 days 9. Does the patient have adequate social supports to accommodate these discharge functional goals? Yes 10. Anticipated D/C setting: Home 11. Anticipated post D/C treatments: HH therapy 12. Overall Rehab/Functional Prognosis: excellent  RECOMMENDATIONS: This patient's condition is appropriate for continued rehabilitative care in  the following setting: CIR Patient has agreed to participate in recommended program. Yes Note that insurance prior authorization may be required for reimbursement for recommended care.  Comment: Rehab Admissions Coordinator to follow up.  Thanks,  [name], MD, [place]     03/20/2014

## 2014-03-20 NOTE — Clinical Social Work Psychosocial (Signed)
Clinical Social Work Department BRIEF PSYCHOSOCIAL ASSESSMENT 03/20/2014  Patient:  [name], [name]     Account Number:  [id number]     Admit date:  03/05/2014  Clinical Social Worker:  Merlyn Lot, CLINICAL SOCIAL WORKER  Date/Time:  03/20/2014 04:35 PM  Referred by:  Physician  Date Referred:  03/20/2014 Referred for  SNF Placement   Other Referral:   Interview type:  Patient Other interview type:    PSYCHOSOCIAL DATA Living Status:  ALONE Admitted from facility:   Level of care:   Primary support name:  Maraki Macquarrie Primary support relationship to patient:  CHILD, ADULT Degree of support available:   Patient stated that her son lives directly beside her and is able to help her when she needs.    CURRENT CONCERNS Current Concerns  Post-Acute Placement   Other Concerns:    SOCIAL WORK ASSESSMENT / PLAN CSW spoke with patient about SNF back up to CIR.  Patient is agreeable to SNF search in [place] and [place]. CSW will continue to follow.   Assessment/plan status:  Psychosocial Support/Ongoing Assessment of Needs Other assessment/ plan:   FL2   Information/referral to community resources:   [place]    PATIENT'S/FAMILY'S RESPONSE TO PLAN OF CARE: Patient is agreeable to SNF but hopes that CIR will be able to take her.       Merlyn Lot, LCSWA Clinical Social Worker [phone]

## 2014-03-20 NOTE — Progress Notes (Signed)
Patient had a witnessed, controlled fall during transfer back to bed at 3pm. RN  X 2 transfering patient in steady lift. Patient began to slide from bed to floor, with fall controlled by RN. Pt hoyer lifted back to bed, vitals within normal limits, pain reported in LE (Chronic). Pain med given at 15:15 with MD notified.

## 2014-03-20 NOTE — Progress Notes (Signed)
Inpatient Diabetes Program Recommendations  AACE/ADA: New Consensus Statement on Inpatient Glycemic Control (2013)  Target Ranges:  Prepandial:   less than 140 mg/dL      Peak postprandial:   less than 180 mg/dL (1-2 hours)      Critically ill patients:  140 - 180 mg/dL   Results for CORNELIOUS, DIVEN (MRN [id number]) as of 03/20/2014 09:20  Ref. Range 03/19/2014 00:19 03/19/2014 04:30 03/19/2014 09:10 03/19/2014 14:02 03/19/2014 17:12 03/19/2014 20:26 03/20/2014 00:47 03/20/2014 05:10  Glucose-Capillary Latest Range: 70-99 mg/dL 409 (H) 811 (H) 914 (H) 282 (H) 267 (H) 182 (H) 178 (H) 191 (H)   Diabetes history: DM2  Outpatient Diabetes medications: Amaryl 2 mg BID, Metformin 1000 mg BID  Current orders for Inpatient glycemic control: Novolog 0-20 units Q4H, Lantus 15 units daily  Inpatient Diabetes Program Recommendations Insulin - Basal: Please increase Lantus to 18 units daily. Insulin - Tube feeding Coverage: Please consider ordering Novolog 3 units Q4H for tube feeding coverage.  Thanks, Orlando Penner, RN, MSN, CCRN Diabetes Coordinator  Inpatient Diabetes Program [phone] (Team Pager) [phone] ([facility] office) [phone] [facility] office)

## 2014-03-21 DIAGNOSIS — R6521 Severe sepsis with septic shock: Secondary | ICD-10-CM

## 2014-03-21 DIAGNOSIS — A419 Sepsis, unspecified organism: Secondary | ICD-10-CM

## 2014-03-21 DIAGNOSIS — E118 Type 2 diabetes mellitus with unspecified complications: Secondary | ICD-10-CM

## 2014-03-21 DIAGNOSIS — R6520 Severe sepsis without septic shock: Secondary | ICD-10-CM

## 2014-03-21 DIAGNOSIS — E1165 Type 2 diabetes mellitus with hyperglycemia: Secondary | ICD-10-CM

## 2014-03-21 DIAGNOSIS — IMO0002 Reserved for concepts with insufficient information to code with codable children: Secondary | ICD-10-CM

## 2014-03-21 DIAGNOSIS — F17200 Nicotine dependence, unspecified, uncomplicated: Secondary | ICD-10-CM

## 2014-03-21 LAB — BASIC METABOLIC PANEL WITH GFR
Anion gap: 9 (ref 5–15)
BUN: 19 mg/dL (ref 6–23)
CO2: 31 meq/L (ref 19–32)
Calcium: 8.4 mg/dL (ref 8.4–10.5)
Chloride: 102 meq/L (ref 96–112)
Creatinine, Ser: 0.36 mg/dL — ABNORMAL LOW (ref 0.50–1.10)
GFR calc Af Amer: >90 mL/min
GFR calc non Af Amer: >90 mL/min
Glucose, Bld: 224 mg/dL — ABNORMAL HIGH (ref 70–99)
Potassium: 3.6 meq/L — ABNORMAL LOW (ref 3.7–5.3)
Sodium: 142 meq/L (ref 137–147)

## 2014-03-21 LAB — GLUCOSE, CAPILLARY
GLUCOSE-CAPILLARY: 132 mg/dL — AB (ref 70–99)
GLUCOSE-CAPILLARY: 186 mg/dL — AB (ref 70–99)
GLUCOSE-CAPILLARY: 206 mg/dL — AB (ref 70–99)
GLUCOSE-CAPILLARY: 243 mg/dL — AB (ref 70–99)
Glucose-Capillary: 192 mg/dL — ABNORMAL HIGH (ref 70–99)
Glucose-Capillary: 200 mg/dL — ABNORMAL HIGH (ref 70–99)

## 2014-03-21 LAB — CARBOXYHEMOGLOBIN
CARBOXYHEMOGLOBIN: 2.2 % — AB (ref 0.5–1.5)
METHEMOGLOBIN: 0.5 % (ref 0.0–1.5)
O2 SAT: 95.5 %
TOTAL HEMOGLOBIN: 10.5 g/dL — AB (ref 12.0–16.0)

## 2014-03-21 LAB — DIGOXIN LEVEL

## 2014-03-21 MED ORDER — CLOPIDOGREL BISULFATE 300 MG PO TABS
300.0000 mg | ORAL_TABLET | Freq: Once | ORAL | Status: AC
  Administered 2014-03-21: 300 mg via ORAL
  Filled 2014-03-21: qty 1

## 2014-03-21 MED ORDER — SODIUM CHLORIDE 0.9 % IV SOLN
INTRAVENOUS | Status: DC
  Administered 2014-03-22: 07:00:00 via INTRAVENOUS

## 2014-03-21 MED ORDER — ASPIRIN 81 MG PO CHEW: 81.0000 mg | CHEWABLE_TABLET | ORAL | Status: DC

## 2014-03-21 MED ORDER — CLOPIDOGREL BISULFATE 75 MG PO TABS
75.0000 mg | ORAL_TABLET | Freq: Every day | ORAL | Status: DC
Start: 2014-03-22 — End: 2014-03-22
  Administered 2014-03-22: 75 mg via ORAL
  Filled 2014-03-21: qty 1

## 2014-03-21 MED ORDER — CLOPIDOGREL BISULFATE 75 MG PO TABS
75.0000 mg | ORAL_TABLET | Freq: Once | ORAL | Status: AC
  Administered 2014-03-22: 75 mg via ORAL
  Filled 2014-03-21: qty 1

## 2014-03-21 MED ORDER — SPIRONOLACTONE 25 MG PO TABS
25.0000 mg | ORAL_TABLET | Freq: Every day | ORAL | Status: DC
  Administered 2014-03-21 – 2014-03-23 (×3): 25 mg
  Filled 2014-03-21 (×4): qty 1

## 2014-03-21 MED ORDER — SODIUM CHLORIDE 0.9 % IV SOLN: 250.0000 mL | INTRAVENOUS | Status: DC | PRN

## 2014-03-21 MED ORDER — SODIUM CHLORIDE 0.9 % IJ SOLN: 3.0000 mL | INTRAMUSCULAR | Status: DC | PRN

## 2014-03-21 MED ORDER — POTASSIUM CHLORIDE 10 MEQ/100ML IV SOLN
10.0000 meq | INTRAVENOUS | Status: AC
  Administered 2014-03-21 (×3): 10 meq via INTRAVENOUS
  Filled 2014-03-21 (×3): qty 100

## 2014-03-21 MED ORDER — SODIUM CHLORIDE 0.9 % IJ SOLN
3.0000 mL | Freq: Two times a day (BID) | INTRAMUSCULAR | Status: DC
  Administered 2014-03-22: 3 mL via INTRAVENOUS

## 2014-03-21 NOTE — Evaluation (Signed)
Occupational Therapy Evaluation Patient Details Name: Kaitlin RainsCheryl C [name] MRN: [id number] DOB: 1955/11/18 Today's Date: 03/21/2014    History of Present Illness 58 year old female with h/o COPD, DM, peripheral vascular disease, admitted worsening left lower extremity pain and and abdominal pain, developed acute hypoxic respiratory failure due to pulmonary edema requiring intubation, NSTEMI, MSSA bacteremia after recent angiogram. Initial CXR 9/13 clear however CXR 9/14 notes right basilar airspace opacity may reflect pneumonia.     Clinical Impression   Pt was independent with assistive devices prior to admission.  Pt is able to perform seated ADL with set up to min assist. LE weakness, impaired balance and decreased activity tolerance interfere with ability to perform standing ADL and tranfers.  Pt is highly motivated to return to independence. Will benefit from intense rehab prior to return home. Will follow acutely.    Follow Up Recommendations  CIR    Equipment Recommendations       Recommendations for Other Services       Precautions / Restrictions Precautions Precautions: Fall      Mobility Bed Mobility Overal bed mobility: Needs Assistance Bed Mobility: Rolling;Sidelying to Sit;Sit to Sidelying Rolling: Supervision Sidelying to sit: Min guard;HOB elevated     Sit to sidelying: Min guard General bed mobility comments: used bed rails to pull and LEs to pull herself up in the bed  Transfers Overall transfer level: Needs assistance Equipment used: 2 person hand held assist   Sit to Stand: Max assist;Mod assist;+2 physical assistance;From elevated surface Stand pivot transfers: Mod assist;+2 physical assistance            Balance     Sitting balance-Leahy Scale: Poor Sitting balance - Comments: props on UEs intermittently                                    ADL Overall ADL's : Needs assistance/impaired Eating/Feeding: Supervision/ safety;Bed level  (ST doing trial feeding with applesauce)   Grooming: Wash/dry hands;Wash/dry face;Oral care;Set up;Sitting   Upper Body Bathing: Minimal assitance;Sitting (supported sitting)   Lower Body Bathing: Total assistance;Sit to/from stand;+2 for physical assistance   Upper Body Dressing : Set up;Sitting (supported sitting)   Lower Body Dressing: +2 for safety/equipment;Total assistance;Sit to/from stand Lower Body Dressing Details (indicate cue type and reason): pt can cross her foot over her knee in supine to donn and doff her socks             Functional mobility during ADLs:  (pt in non ambulatory, transfers with Kaitlin SalmonStedy) General ADL Comments: Pt is able to sit up in the chair x 4 hours daily.     Vision                     Perception     Praxis      Pertinent Vitals/Pain Pain Assessment: No/denies pain (states her legs hurt with standing)     Hand Dominance Left   Extremity/Trunk Assessment Upper Extremity Assessment Upper Extremity Assessment: Overall WFL for tasks assessed   Lower Extremity Assessment Lower Extremity Assessment: Defer to PT evaluation       Communication Communication Communication: No difficulties (voice is hoarse)   Cognition Arousal/Alertness: Awake/alert Behavior During Therapy: WFL for tasks assessed/performed Overall Cognitive Status: Within Functional Limits for tasks assessed  General Comments       Exercises       Shoulder Instructions      Home Living Family/patient expects to be discharged to:: Private residence Living Arrangements: Children (pt lives in converted garage, son lives in the house) Available Help at Discharge: Family;Available 24 hours/day (son is on disability from a back injury, but can assist) Type of Home: House Home Access: Level entry     Home Layout: One level     Bathroom Shower/Tub: Producer, television/film/video: Handicapped height     Home Equipment:  Environmental consultant - 2 wheels;Shower seat;Cane - single point;Adaptive equipment;Bedside commode Adaptive Equipment: Reacher        Prior Functioning/Environment Level of Independence: Independent with assistive device(s)             OT Diagnosis: Generalized weakness   OT Problem List: Decreased strength;Decreased activity tolerance;Impaired balance (sitting and/or standing);Decreased knowledge of use of DME or AE;Cardiopulmonary status limiting activity;Obesity   OT Treatment/Interventions: Self-care/ADL training;Therapeutic activities;Therapeutic exercise;DME and/or AE instruction;Balance training;Patient/family education;Energy conservation    OT Goals(Current goals can be found in the care plan section) Acute Rehab OT Goals Patient Stated Goal: to get better OT Goal Formulation: With patient Time For Goal Achievement: 04/04/14 Potential to Achieve Goals: Good  OT Frequency: Min 2X/week   Barriers to D/C:            Co-evaluation              End of Session    Activity Tolerance: Patient limited by fatigue Patient left: in bed;with call bell/phone within reach;with family/visitor present (ST wit pt)   Time: 8546-2703 OT Time Calculation (min): 23 min Charges:  OT General Charges $OT Visit: 1 Procedure OT Evaluation $Initial OT Evaluation Tier I: 1 Procedure OT Treatments $Self Care/Home Management : 8-22 mins G-Codes:    Evern Bio 03/21/2014, 4:02 PM [phone]

## 2014-03-21 NOTE — Progress Notes (Signed)
Rehab admissions - I met with pt in follow up to rehab MD consult and explained the possibility and purpose of inpatient rehab. Questions were answered and informational brochures were given. Pt is interested in pursuing inpatient rehab. She asked that I contact her son. I tried to call pt's son Shanon Brow, but his phone went to voicemail. Message was left to contact me for more information.  I will now open the case with [employer] to seek insurance authorization for inpatient rehab. We will consider possible inpatient rehab admit pending her medical clearance, insurance authorization and our bed availability.  Please call me with any questions. Thanks.  Nanetta Batty, PT Rehabilitation Admissions Coordinator [phone]

## 2014-03-21 NOTE — Progress Notes (Signed)
Speech Language Pathology Treatment: Dysphagia  Patient Details Name: Kaitlin RainsCheryl C Palmisano MRN: [id number] DOB: 1956/03/16 Today's Date: 03/21/2014 Time: [id number] SLP Time Calculation (min): 19 min  Assessment / Plan / Recommendation Clinical Impression  Pt was seen with therapeutic trials of puree, with decreased hyolaryngeal movement and delayed cough noted. PtSLP provided Min cues for small bites and effortful swallows. Pt reported that her vocal quality was much improved from previous SLP visit. Today she has adequate vocal intensity although with moderately hoarse quality. SLP introduced pharyngeal strengthening exercises which patient performed with Min multimodal cueing. Pt verbalized her appreciation for the exercises, and said that she would continue to perform them as recommended until SLP returns.   HPI HPI: 58 year old female with h/o COPD, DM, peripheral vascular disease, admitted worsening left lower extremity pain and  and abdominal pain, developed acute hypoxic respiratory failure due to pulmonary edema requiring intubation 9/14-9/21, extubated and reintubated 9/21-9/23, NSTEMI, MSSA bacteremia after recent angiogram. FEES on 9/24 revealed severe acute reversible dysphagia characterized by intubation related sensory deficits, generalized weakness, and moderately severe laryngeal and pharyngeal edema resulting in a delayed swallow initiation and decreased laryngeal closure. Deep penetration and aspiration with inconsistent sensation of all consistencies.  NG placed.  MBS today to determine any potential for conservative POs.     Pertinent Vitals Pain Assessment: No/denies pain  SLP Plan  Continue with current plan of care    Recommendations Diet recommendations: NPO;Other(comment) (therapeutic trials of puree with SLP only) Medication Administration: Via alternative means              Oral Care Recommendations: Oral care Q4 per protocol;Oral care prior to ice chips Follow up  Recommendations:  (tbd) Plan: Continue with current plan of care    GO      Maxcine HamLaura Paiewonsky, M.A. CCC-SLP [phone]  Maxcine Hamaiewonsky, Tayton Decaire 03/21/2014, 3:52 PM

## 2014-03-21 NOTE — Progress Notes (Addendum)
PROGRESS NOTE  Subjective:    Seen by TCTS. Not CABG candidate. Yesterday started on spironolactone 12.5 mg daily. Renal function stable.  Still receiving IV lasix with good UOP. CVP 6.  Denies SOB, orthopnea or CP.   Objective:    Vital Signs:   Temp:  [97.9 F (36.6 C)-98.5 F (36.9 C)] 98.2 F (36.8 C) (09/29 0447) Pulse Rate:  [69-84] 84 (09/29 0806) Resp:  [15] 15 (09/29 0747) BP: (96-127)/(49-93) 127/53 mmHg (09/29 0806) SpO2:  [95 %-100 %] 95 % (09/29 0841) Weight:  [197 lb 5 oz (89.5 kg)] 197 lb 5 oz (89.5 kg) (09/29 0447)  Last BM Date: 03/20/14   24-hour weight change: Weight change: 0 lb (0 kg)  Weight trends: Filed Weights   03/18/14 1819 03/20/14 0500 03/21/14 0447  Weight: 198 lb 13.7 oz (90.2 kg) 197 lb 5 oz (89.5 kg) 197 lb 5 oz (89.5 kg)    Intake/Output:  09/28 0701 - 09/29 0700 In: 3070 [I.V.:540; NG/GT:2430; IV Piggyback:100] Out: 2200 [Urine:2200] Total I/O In: 55 [I.V.:10; NG/GT:45] Out: 200 [Urine:200]   Physical Exam: BP 127/53  Pulse 84  Temp(Src) 98.2 F (36.8 C) (Oral)  Resp 15  Ht _0  (1.575 m)  Wt 197 lb 5 oz (89.5 kg)  BMI 36.08 kg/m2  SpO2 95%  Wt Readings from Last 3 Encounters:  03/21/14 197 lb 5 oz (89.5 kg)  03/21/14 197 lb 5 oz (89.5 kg)  03/01/14 196 lb (88.905 kg)    General: Vital signs reviewed and noted. Chronically ill appearing, NAD  Head: Normocephalic, atraumatic.  Eyes: conjunctivae/corneas clear.  EOM's intact.   Throat: normal  Neck:  JVD does not appear elevated  Lungs:  Decreased BS throughout no wheezing  Heart:  RRR, no s3 2/6 TR  Abdomen:  Soft, non-tender, non-distended    Extremities:  Warm no edema. L DP non palpable and R 2+, no edema, R PICC  Neurologic: Moves all 4  Psych: A&Ox3    Labs: BMET:  Recent Labs  03/18/14 1525 03/19/14 0630  03/20/14 0500 03/21/14 0616  NA  --   --   < > 144 142  K  --   --   < > 3.7 3.6*  CL  --   --   < > 106 102  CO2  --   --   < > 29 31   GLUCOSE  --   --   < > 185* 224*  BUN  --   --   < > 17 19  CREATININE  --   --   < > 0.34* 0.36*  CALCIUM  --   --   < > 7.9* 8.4  MG 2.1 2.0  --   --   --   PHOS 3.6 3.2  --   --   --   < > = values in this interval not displayed.  Liver function tests:  Recent Labs  03/20/14 0500  AST 17  ALT 16  ALKPHOS 47  BILITOT 0.3  PROT 5.5*  ALBUMIN 2.3*   No results found for this basename: LIPASE, AMYLASE,  in the last 72 hours  CBC:  Recent Labs  03/19/14 0630  WBC 6.4  HGB 9.5*  HCT 30.8*  MCV 73.9*  PLT 236    Cardiac Enzymes: No results found for this basename: CKTOTAL, CKMB, TROPONINI,  in the last 72 hours  Coagulation Studies: No results found for this basename: LABPROT, INR,  in the last 72 hours  Other: No components found with this basename: POCBNP,  No results found for this basename: DDIMER,  in the last 72 hours No results found for this basename: HGBA1C,  in the last 72 hours No results found for this basename: CHOL, HDL, LDLCALC, TRIG, CHOLHDL,  in the last 72 hours No results found for this basename: TSH, T4TOTAL, FREET3, T3FREE, THYROIDAB,  in the last 72 hours No results found for this basename: VITAMINB12, FOLATE, FERRITIN, TIBC, IRON, RETICCTPCT,  in the last 72 hours   Other results:  EKG :  NSR at 93.  Old ant. MI  Medications:    Infusions: . sodium chloride 10 mL/hr at 03/20/14 2124  . feeding supplement (JEVITY 1.2 CAL) 1,000 mL (03/20/14 2124)    Scheduled Medications: . aspirin  81 mg Per Tube Daily  . atorvastatin  40 mg Per Tube q1800  . carvedilol  3.125 mg Per Tube BID WC  .  ceFAZolin (ANCEF) IV  2 g Intravenous 3 times per day  . digoxin  0.125 mg Per Tube Daily  . escitalopram  10 mg Per Tube Daily  . feeding supplement (PRO-STAT SUGAR FREE 64)  30 mL Per Tube QID  . furosemide  40 mg Per Tube BID  . gabapentin  300 mg Per Tube Q8H  . heparin subcutaneous  5,000 Units Subcutaneous 3 times per day  . insulin aspart   0-20 Units Subcutaneous 6 times per day  . insulin glargine  20 Units Subcutaneous Daily  . ipratropium-albuterol  3 mL Nebulization Q6H  . losartan  50 mg Per Tube Daily  . nitroGLYCERIN  0.4 mg Transdermal Q24H  . predniSONE  10 mg Per Tube Q breakfast  . spironolactone  25 mg Per Tube Daily    Assessment/ Plan:    1. NSTEMI with severe three-vessel coronary artery disease - not amenable to CABG. LAD not amenable to PCI but could potentially intervene on RCA/LCx.  2. Acute on chronic systolic congestive heart failure with an EF of 10-15% initially this hospitalization => up to 30-35% on last echo.  3. Acute respiratory failure 4. Fever/leukocytosis    --MSSA bacteremia last week: no endocarditis on TEE.  5. Diabetes mellitus 6. COPD 7. Peripheral vascular disease -status post right iliac stent, status post fem - tibioperoneal trunk. L foot ulcerations 8. Hypernatremia 9. Iron-def anemia   Stable overnight. Renal function remains stable and CVP 6, continue Lasix per tube.  Pending co-ox, she remains off milrinone. Continue low dose BB will not titrate with recent acute HF.  Will continue losartan 50 mg daily and increase spironolactone to 25 mg daily. Continue digoxin, dig level ok.   With not being candidate for CABG will discuss with Dr. Aundra Dubin about possibility of adding Plavix for CAD.   Will need 3 weeks of cefazolin for recent MSSA bacteremia.   OOB today and work with PT.   Rande Brunt, NP-C 9:16 AM  Patient seen with NP, agree with the above note.  Volume status looks ok, continue po Lasix.  Agree with increase in spironolactone.  She is stable today.  I discussed her cath films with Dr Irish Lack.  LCx was likely culprit for NSTEMI and is amenable to PCI, as is the RCA lesion.  Diffusely diseased LAD is not.  She did not have good CABG targets.  I am going to start Plavix, and will discuss intervention on LCx and RCA with team.   Loralie Champagne  03/21/2014 9:56  AM   Dr. Aundra Dubin came back and met with patient and discussed going to cath lab tomorrow to try and do PCI on LCx and LAD. Discussed with the patient and she is agreeable to proceed.

## 2014-03-21 NOTE — Progress Notes (Signed)
[facility] TEAM 1 - Stepdown/ICU TEAM Progress Note  [name]:[id number] DOB: 09/27/1955 DOA: 03/05/2014 PCP: Kirk Ruths, MD  Admit HPI / Brief Narrative: [name] is a 58 y.o. WF  PMHx PVD, venous stasis ulcers diabetes type 2, HLD, HTN, COPD. who has had a recent right lower extremity runoff presented to the ER because of worsening left lower extremity pain over the last 2 days. In the ER patient was seen by (vascular surgeon) Dr. Imogene Burn who at this time from the patient's left lower extremity pain was vascular in origin and can be seen as outpatient. In the ER patient was found to be hypotensive and febrile. Blood cultures were obtained along with urine cultures. Chest x-ray and UA were unremarkable. On exam patient had mild discharge from the right groin wound which patient states has been there for last 6 weeks. Patient also has mild skin discoloration in the left groin area where she had catheterization and was weak. Patient states she has been having some abdominal discomfort for over the last 2 months mostly in the epigastric area. Patient has been admitted for sepsis source not clear. Patient otherwise denies any chest pain shortness of breath diarrhea fever chills or any headache or visual symptoms. Patient was found to be tachycardic.    HPI/Subjective: 9/29 A./O. x4, patient sitting on the edge of bed participating with physical therapy. Patient having moderate amount of difficulty rising to standing position even with two-person assistance. Positive DOE, negative CP  Assessment/Plan: Acute hypoxic resp failure due to ischemia and flash edema  -Resolved - extubated   Bilateral effusions L >R > transudate  -only 500cc removed 9/23 from left - no respiratory distress at this time  COPD w/ acute exacerbation  -Albuterol nebulizer PRN -Prednisone 10 mg daily  ?HCAP 9/22 > doubt  -quantitative culture negative from BAL   Tobacco abuse  -Currently nonissue     NSTEMI with severe three-vessel CAD; not a CABG candidate  -LCx was likely culprit for NSTEMI and is amenable to PCI, as is the RCA lesion. - Diffusely diseased LAD is not amenable to PCI. She did not have good CABG targets. -Start Plavix, cardiology team to discuss if/when PCI to be performed  Ischemic cardiomyopathy / Acute on chronic systolic congestive heart failure with an EF of 10-15%  -Coreg 3.125 mg BID -Digoxin 0.125 mg Daily  -Furosemide 40 mg BID -Losartan 50 mg daily -Spironolactone 25 mg daily   Septic shock  resolved   MSSA bacteremia 2/2 blood cx  -Angiogram but source uncertain  - TEE negative, cultures now clear  -cefazolin, plan 3 weeks total per ID (stop date 03/25/2014)  - repeat blood cx 9/22 remarkable for 1/2 coag negative Staph, c/w a contaminant   Pseudomonas R groin wound   L inguinal hematoma without evidence of bleeding   Hypophosphatemia  F/u in AM   Hypokalemia  -Potassium goal > 4  -Check magnesium in the a.m.  Chronic anemia  Recheck CBC in AM   DM type 2 -CBG not at goal  -Obtain hemoglobin A1c  HLD -Lipitor 40 mg daily -Obtain lipid panel    Code Status: FULL Family Communication: no family present at time of exam Disposition Plan: Per cardiology    Consultants: Dr. Marca Ancona (cardiology, heart failure) Dr. Coral Else (vascular surgery) Dr. Max Fickle (PCCM.)    Procedure/Significant Events: 9/13 PM - admitted  9/14 hypoxia, hypotension, +Trop I - intubated  03/01/13 Echo- EF 30-35%  9/15 pos BC MSSA  9/16 TEE- no LAA thrombus, no valvular vegetation, anterior, anteroseptal, apical, and inferioapical hypokinesis, EF 35%  9/17 EKG- sinus tach, low voltage QRS, cannot r/o anteroseptal infarct  9/17 - neg 2 liters, still bronchospastic  9/18 LHC- 40% distal left main, severe diffuse disease from LAD, 95% stenosis circ, focal 75% mid RCA, LVEF 15%  9/19 extubated  9/21 acute hypoxemic respiratory failure,  re-intubated, cardiology started milrninone  9/21 L groin vascular ultrasound >>> no evidence of complication around graft site  9/23 thora left side 500c removed, extubated  9/24 failed swallow evaluation; TCTS consult > not a CABG candidate, rec medical management, transfused 1 U PRBC  9/26 TRH assumed care    Culture 9/13 right groin wound positive Pseudomonas aeruginosa 9/14 urine negative 9/15 blood right hand/left neck negative 9/22 blood left hand positive staph coag negative (most likely contaminant) 9/22 blood right hand negative 9/22 BAL negative 9/23 left pleural fluid NGTD   Antibiotics: Vanc 9/14 > 9/17 + 9/22 > 9/25  Cefepime 9/14 > 9/23  Ancef 9/21 + 9/25 >  Doxycycline 9/24 >    DVT prophylaxis: SQ heparin    Devices    LINES / TUBES:      Continuous Infusions: . sodium chloride 10 mL/hr at 03/20/14 2124  . feeding supplement (JEVITY 1.2 CAL) 1,000 mL (03/20/14 2124)    Objective: VITAL SIGNS: Temp: 98.2 F (36.8 C) (09/29 0447) Temp src: Oral (09/29 0447) BP: 127/53 mmHg (09/29 0806) Pulse Rate: 84 (09/29 0806) SPO2; 97% on 2 L O2 via [place] FIO2:   Intake/Output Summary (Last 24 hours) at 03/21/14 1010 Last data filed at 03/21/14 0845  Gross per 24 hour  Intake   3125 ml  Output   2400 ml  Net    725 ml     Exam: General: A./O. x4, NAD, No acute respiratory distress Lungs: voice is hoarse, Clear to auscultation bilaterally without wheezes or crackles Cardiovascular: Regular rate and rhythm without murmur gallop or rub normal S1 and S2 Abdomen: Nontender, nondistended, soft, bowel sounds positive, no rebound, no ascites, no appreciable mass Extremities: No significant cyanosis, clubbing, or edema bilateral lower extremities  Data Reviewed: Basic Metabolic Panel:  Recent Labs Lab 03/17/14 0400 03/17/14 1505 03/18/14 0355 03/18/14 1525 03/19/14 0630 03/19/14 0901 03/20/14 0500 03/21/14 0616  NA 146  --  141  --   --  141 144  142  K 4.6  --  3.9  --   --  3.8 3.7 3.6*  CL 109  --  104  --   --  102 106 102  CO2 28  --  29  --   --  28 29 31   GLUCOSE 209*  --  207*  --   --  226* 185* 224*  BUN 16  --  17  --   --  16 17 19   CREATININE 0.37*  --  0.37*  --   --  0.34* 0.34* 0.36*  CALCIUM 8.1*  --  8.0*  --   --  8.0* 7.9* 8.4  MG 2.1 2.0 2.0 2.1 2.0  --   --   --   PHOS 3.0 3.0 4.3 3.6 3.2  --   --   --    Liver Function Tests:  Recent Labs Lab 03/15/14 1830 03/20/14 0500  AST  --  17  ALT  --  16  ALKPHOS  --  47  BILITOT  --  0.3  PROT 5.9* 5.5*  ALBUMIN  --  2.3*   No results found for this basename: LIPASE, AMYLASE,  in the last 168 hours No results found for this basename: AMMONIA,  in the last 168 hours CBC:  Recent Labs Lab 03/15/14 0430 03/15/14 1000 03/16/14 0500 03/17/14 0400 03/19/14 0630  WBC 11.9* 13.1* 8.7 6.3 6.4  NEUTROABS 9.5* 11.4*  --   --   --   HGB 7.9* 8.1* 7.7* 9.1* 9.5*  HCT 27.2* 28.4* 26.0* 29.7* 30.8*  MCV 76.0* 75.9* 75.1* 74.1* 73.9*  PLT 229 231 198 196 236   Cardiac Enzymes:  Recent Labs Lab 03/15/14 1000  TROPONINI 0.58*   BNP (last 3 results)  Recent Labs  03/06/14 1015 03/19/14 0630  PROBNP 8842.0* 4761.0*   CBG:  Recent Labs Lab 03/20/14 1741 03/20/14 2020 03/21/14 0041 03/21/14 0447 03/21/14 0827  GLUCAP 219* 227* 132* 192* 206*    Recent Results (from the past 240 hour(s))  CULTURE, BLOOD (ROUTINE X 2)     Status: None   Collection Time    03/14/14 11:06 AM      Result Value Ref Range Status   Specimen Description BLOOD LEFT HAND   Final   Special Requests BOTTLES DRAWN AEROBIC AND ANAEROBIC 10CC   Final   Culture  Setup Time     Final   Value: 03/14/2014 16:45     Performed at [facility]   Culture     Final   Value: STAPHYLOCOCCUS SPECIES (COAGULASE NEGATIVE)     Note: THE SIGNIFICANCE OF ISOLATING THIS ORGANISM FROM A SINGLE SET OF BLOOD CULTURES WHEN MULTIPLE SETS ARE DRAWN IS UNCERTAIN. PLEASE NOTIFY THE  MICROBIOLOGY DEPARTMENT WITHIN ONE WEEK IF SPECIATION AND SENSITIVITIES ARE REQUIRED.     Note: Gram Stain Report Called to,Read Back By and Verified With: MONICA CROSS TAKEN FOR NURSE 12:55AM 03/16/14 THOMI     Performed at [facility]   Report Status 03/17/2014 FINAL   Final  CULTURE, BLOOD (ROUTINE X 2)     Status: None   Collection Time    03/14/14 11:16 AM      Result Value Ref Range Status   Specimen Description BLOOD RIGHT HAND   Final   Special Requests BOTTLES DRAWN AEROBIC AND ANAEROBIC 10CC   Final   Culture  Setup Time     Final   Value: 03/14/2014 16:43     Performed at [facility]   Culture     Final   Value: NO GROWTH 5 DAYS     Performed at [facility]   Report Status 03/20/2014 FINAL   Final  CULTURE, BAL-QUANTITATIVE     Status: None   Collection Time    03/14/14 12:47 PM      Result Value Ref Range Status   Specimen Description BRONCHIAL ALVEOLAR LAVAGE   Final   Special Requests Normal   Final   Gram Stain     Final   Value: FEW WBC PRESENT,BOTH PMN AND MONONUCLEAR     RARE SQUAMOUS EPITHELIAL CELLS PRESENT     NO ORGANISMS SEEN     Performed at [facility] Count     Final   Value: 10,000 COLONIES/ML     Performed at [facility]   Culture     Final   Value: Non-Pathogenic Oropharyngeal-type Flora Isolated.     Performed at [facility]   Report Status 03/16/2014 FINAL  Final  AFB CULTURE WITH SMEAR     Status: None   Collection Time    03/15/14  2:33 PM      Result Value Ref Range Status   Specimen Description FLUID LEFT PLEURAL   Final   Special Requests NONE   Final   Acid Fast Smear     Final   Value: NO ACID FAST BACILLI SEEN     Performed at [facility]   Culture     Final   Value: CULTURE WILL BE EXAMINED FOR 6 WEEKS BEFORE ISSUING A FINAL REPORT     Performed at [facility]   Report Status PENDING   Incomplete  BODY FLUID CULTURE     Status: None   Collection  Time    03/15/14  2:33 PM      Result Value Ref Range Status   Specimen Description FLUID LEFT PLEURAL   Final   Special Requests NONE   Final   Gram Stain     Final   Value: RARE WBC PRESENT, PREDOMINANTLY MONONUCLEAR     NO ORGANISMS SEEN     Performed at [facility]   Culture     Final   Value: NO GROWTH 3 DAYS     Performed at [facility]   Report Status 03/19/2014 FINAL   Final  FUNGUS CULTURE W SMEAR     Status: None   Collection Time    03/15/14  2:33 PM      Result Value Ref Range Status   Specimen Description PLEURAL LEFT   Final   Special Requests NONE   Final   Fungal Smear     Final   Value: NO YEAST OR FUNGAL ELEMENTS SEEN     Performed at [facility]   Culture     Final   Value: CULTURE IN PROGRESS FOR FOUR WEEKS     Performed at [facility]   Report Status PENDING   Incomplete     Studies:  Recent x-ray studies have been reviewed in detail by the Attending Physician  Scheduled Meds:  Scheduled Meds: . aspirin  81 mg Per Tube Daily  . atorvastatin  40 mg Per Tube q1800  . carvedilol  3.125 mg Per Tube BID WC  .  ceFAZolin (ANCEF) IV  2 g Intravenous 3 times per day  . clopidogrel  300 mg Oral Once  . [START ON 03/22/2014] clopidogrel  75 mg Oral Daily  . digoxin  0.125 mg Per Tube Daily  . escitalopram  10 mg Per Tube Daily  . feeding supplement (PRO-STAT SUGAR FREE 64)  30 mL Per Tube QID  . furosemide  40 mg Per Tube BID  . gabapentin  300 mg Per Tube Q8H  . heparin subcutaneous  5,000 Units Subcutaneous 3 times per day  . insulin aspart  0-20 Units Subcutaneous 6 times per day  . insulin glargine  20 Units Subcutaneous Daily  . losartan  50 mg Per Tube Daily  . nitroGLYCERIN  0.4 mg Transdermal Q24H  . predniSONE  10 mg Per Tube Q breakfast  . spironolactone  25 mg Per Tube Daily    Time spent on care of this patient: 40 mins   Drema Dallas , MD   [facility] Office  [phone] Pager -  [phone]  On-Call/Text Page:      [email or web address]      password [email or web address]  If 7PM-7AM, please contact night-coverage [email or web address]  Password [email or web address] 03/21/2014, 10:10 AM   LOS: 16 days

## 2014-03-21 NOTE — Progress Notes (Signed)
Physical Therapy Treatment Patient Details Name: [name] MRN: [id number] DOB: 1956/01/15 Today's Date: 03/21/2014    History of Present Illness 58 year old female with h/o COPD, DM, peripheral vascular disease, admitted worsening left lower extremity pain and and abdominal pain, developed acute hypoxic respiratory failure due to pulmonary edema requiring intubation, NSTEMI, MSSA bacteremia after recent angiogram. Initial CXR 9/13 clear however CXR 9/14 notes right basilar airspace opacity may reflect pneumonia.      PT Comments    Pt very motivated and despite being "a bit gun-shy" re: standing with stedy (after fall 9/28), she agreed to do this. Pt does fatigue quickly (as evidenced by HR increasing to up to 140 with activity), requiring more and more assist with sit to stand (from elevated surface) as session progressed. Provided handout for exercise "homework" and pt thankful for these.    Follow Up Recommendations  CIR;Supervision/Assistance - 24 hour     Equipment Recommendations   (TBA)    Recommendations for Other Services Rehab consult     Precautions / Restrictions Precautions Precautions: Fall Precaution Comments: fell 9/28 with nursing (missed EOB when sitting from stedy)    Mobility  Bed Mobility Overal bed mobility: Needs Assistance Bed Mobility: Rolling;Sidelying to Sit Rolling: Supervision Sidelying to sit: Min guard;HOB elevated       General bed mobility comments: used bed rail; nearly needed assist to come to sit (incr effort and time with vc's to push up from side)  Transfers Overall transfer level: Needs assistance Equipment used:  [name]) Transfers: Sit to/from Stand Sit to Stand: Max assist;Mod assist;+2 physical assistance;From elevated surface Stand pivot transfers:  (pivot with stedy)       General transfer comment: stood from elevated EOB with mod assist +2; sat on elevated seat of stedy and stood again x 4 requiring progressively more  assist (up to max assist +2) as she fatigued, even with seated rest up to 4 minutes  Ambulation/Gait             General Gait Details: unable   Stairs            Wheelchair Mobility    Modified Rankin (Stroke Patients Only)       Balance   Sitting-balance support: Bilateral upper extremity supported Sitting balance-Leahy Scale: Poor     Standing balance support: Bilateral upper extremity supported Standing balance-Leahy Scale: Poor Standing balance comment: stood at most x 60 seconds, but progressively shorter periods each time                    Cognition Arousal/Alertness: Awake/alert Behavior During Therapy: WFL for tasks assessed/performed Overall Cognitive Status: Within Functional Limits for tasks assessed                      Exercises General Exercises - Lower Extremity Ankle Circles/Pumps: AROM;Both;10 reps (emphasized dorsiflexion stretch/hold) Gluteal Sets: AROM;Both;10 reps Straight Leg Raises: AROM;Both;5 reps Low Level/ICU Exercises Quad Sets: AROM;Both;10 reps Stabilized Bridging: AAROM;Both;5 reps (feet stabilized on mattress)    General Comments        Pertinent Vitals/Pain HR 88 at rest; first standing attempts <120, however last 2 attempts, quickly incr to 130s with max of 140  Pain Assessment: Faces Faces Pain Scale: Hurts even more Pain Location: anterior lower legs Pain Intervention(s): Limited activity within patient's tolerance;Monitored during session;Repositioned    Home Living  Prior Function            PT Goals (current goals can now be found in the care plan section) Acute Rehab PT Goals Patient Stated Goal: to get better Progress towards PT goals: Progressing toward goals    Frequency  Min 3X/week    PT Plan Current plan remains appropriate    Co-evaluation             End of Session Equipment Utilized During Treatment: Gait belt;Oxygen (stedy) Activity  Tolerance: Patient limited by fatigue Patient left: in chair;with call bell/phone within reach;with nursing/sitter in room     Time: 0802-0841 PT Time Calculation (min): 39 min  Charges:  $Therapeutic Exercise: 8-22 mins $Therapeutic Activity: 23-37 mins                    G Codes:      Elison Worrel 03/21/2014, 10:10 AM Pager [phone]

## 2014-03-22 ENCOUNTER — Encounter (HOSPITAL_COMMUNITY)
Admission: EM | Disposition: A | Discharge status: Rehabilitation Facility | Payer: Self-pay | Source: Home / Self Care | Attending: Pulmonary Disease

## 2014-03-22 DIAGNOSIS — I2510 Atherosclerotic heart disease of native coronary artery without angina pectoris: Secondary | ICD-10-CM

## 2014-03-22 HISTORY — PX: PERCUTANEOUS CORONARY STENT INTERVENTION (PCI-S): SHX5485

## 2014-03-22 LAB — COMPREHENSIVE METABOLIC PANEL
ALT: 13 U/L (ref 0–35)
AST: 22 U/L (ref 0–37)
Albumin: 2.4 g/dL — ABNORMAL LOW (ref 3.5–5.2)
Alkaline Phosphatase: 54 U/L (ref 39–117)
Anion gap: 11 (ref 5–15)
BUN: 18 mg/dL (ref 6–23)
CO2: 29 meq/L (ref 19–32)
Calcium: 8.5 mg/dL (ref 8.4–10.5)
Chloride: 103 mEq/L (ref 96–112)
Creatinine, Ser: 0.36 mg/dL — ABNORMAL LOW (ref 0.50–1.10)
GLUCOSE: 132 mg/dL — AB (ref 70–99)
POTASSIUM: 3.8 meq/L (ref 3.7–5.3)
SODIUM: 143 meq/L (ref 137–147)
Total Bilirubin: 0.4 mg/dL (ref 0.3–1.2)
Total Protein: 6.1 g/dL (ref 6.0–8.3)

## 2014-03-22 LAB — PHOSPHORUS: Phosphorus: 3.9 mg/dL (ref 2.3–4.6)

## 2014-03-22 LAB — CARBOXYHEMOGLOBIN
Carboxyhemoglobin: 2 % — ABNORMAL HIGH (ref 0.5–1.5)
Methemoglobin: 0.8 % (ref 0.0–1.5)
O2 Saturation: 80.6 %
TOTAL HEMOGLOBIN: 11.1 g/dL — AB (ref 12.0–16.0)

## 2014-03-22 LAB — CBC WITH DIFFERENTIAL/PLATELET
Basophils Absolute: 0 10*3/uL (ref 0.0–0.1)
Basophils Relative: 0 % (ref 0–1)
Eosinophils Absolute: 0.1 10*3/uL (ref 0.0–0.7)
Eosinophils Relative: 2 % (ref 0–5)
HCT: 34.4 % — ABNORMAL LOW (ref 36.0–46.0)
Hemoglobin: 10.7 g/dL — ABNORMAL LOW (ref 12.0–15.0)
LYMPHS PCT: 29 % (ref 12–46)
Lymphs Abs: 1.6 10*3/uL (ref 0.7–4.0)
MCH: 24.2 pg — ABNORMAL LOW (ref 26.0–34.0)
MCHC: 31.1 g/dL (ref 30.0–36.0)
MCV: 77.7 fL — ABNORMAL LOW (ref 78.0–100.0)
Monocytes Absolute: 0.5 10*3/uL (ref 0.1–1.0)
Monocytes Relative: 10 % (ref 3–12)
NEUTROS PCT: 59 % (ref 43–77)
Neutro Abs: 3.4 10*3/uL (ref 1.7–7.7)
PLATELETS: 256 10*3/uL (ref 150–400)
RBC: 4.43 MIL/uL (ref 3.87–5.11)
RDW: 20.4 % — ABNORMAL HIGH (ref 11.5–15.5)
WBC: 5.7 10*3/uL (ref 4.0–10.5)

## 2014-03-22 LAB — LIPID PANEL
Cholesterol: 135 mg/dL (ref 0–200)
HDL: 31 mg/dL — AB (ref >39)
LDL Cholesterol: 69 mg/dL (ref 0–99)
Total CHOL/HDL Ratio: 4.4 RATIO
Triglycerides: 176 mg/dL — ABNORMAL HIGH (ref <150)
VLDL: 35 mg/dL (ref 0–40)

## 2014-03-22 LAB — PROTIME-INR
INR: 1.08 (ref 0.00–1.49)
PROTHROMBIN TIME: 14 s (ref 11.6–15.2)

## 2014-03-22 LAB — MRSA PCR SCREENING: MRSA by PCR: NEGATIVE

## 2014-03-22 LAB — GLUCOSE, CAPILLARY
GLUCOSE-CAPILLARY: 165 mg/dL — AB (ref 70–99)
Glucose-Capillary: 120 mg/dL — ABNORMAL HIGH (ref 70–99)
Glucose-Capillary: 141 mg/dL — ABNORMAL HIGH (ref 70–99)
Glucose-Capillary: 143 mg/dL — ABNORMAL HIGH (ref 70–99)
Glucose-Capillary: 155 mg/dL — ABNORMAL HIGH (ref 70–99)
Glucose-Capillary: 245 mg/dL — ABNORMAL HIGH (ref 70–99)

## 2014-03-22 LAB — POCT ACTIVATED CLOTTING TIME: ACTIVATED CLOTTING TIME: 354 s

## 2014-03-22 LAB — HEMOGLOBIN A1C
Hgb A1c MFr Bld: 7.2 % — ABNORMAL HIGH (ref <5.7)
Mean Plasma Glucose: 160 mg/dL — ABNORMAL HIGH (ref <117)

## 2014-03-22 LAB — MAGNESIUM: MAGNESIUM: 2.1 mg/dL (ref 1.5–2.5)

## 2014-03-22 SURGERY — PERCUTANEOUS CORONARY STENT INTERVENTION (PCI-S)
Anesthesia: LOCAL

## 2014-03-22 MED ORDER — NITROGLYCERIN 1 MG/10 ML FOR IR/CATH LAB
INTRA_ARTERIAL | Status: AC
  Filled 2014-03-22: qty 10

## 2014-03-22 MED ORDER — SODIUM CHLORIDE 0.9 % IV SOLN
0.2500 mg/kg/h | Freq: Once | INTRAVENOUS | Status: DC
  Filled 2014-03-22: qty 250

## 2014-03-22 MED ORDER — FENTANYL CITRATE 0.05 MG/ML IJ SOLN
INTRAMUSCULAR | Status: AC
  Filled 2014-03-22: qty 2

## 2014-03-22 MED ORDER — MIDAZOLAM HCL 2 MG/2ML IJ SOLN
INTRAMUSCULAR | Status: AC
  Filled 2014-03-22: qty 2

## 2014-03-22 MED ORDER — HEPARIN (PORCINE) IN NACL 2-0.9 UNIT/ML-% IJ SOLN
INTRAMUSCULAR | Status: AC
  Filled 2014-03-22: qty 1000

## 2014-03-22 MED ORDER — OXYCODONE-ACETAMINOPHEN 5-325 MG PO TABS: 1.0000 | ORAL_TABLET | ORAL | Status: DC | PRN

## 2014-03-22 MED ORDER — OXYCODONE HCL 5 MG/5ML PO SOLN
5.0000 mg | ORAL | Status: DC | PRN
Start: 2014-03-22 — End: 2014-03-24
  Administered 2014-03-23: 10 mg
  Filled 2014-03-22: qty 10

## 2014-03-22 MED ORDER — LIDOCAINE HCL (PF) 1 % IJ SOLN
INTRAMUSCULAR | Status: AC
  Filled 2014-03-22: qty 30

## 2014-03-22 MED ORDER — SODIUM CHLORIDE 0.9 % IV SOLN
INTRAVENOUS | Status: AC
  Administered 2014-03-22: 18:00:00 via INTRAVENOUS

## 2014-03-22 MED ORDER — CLOPIDOGREL BISULFATE 75 MG PO TABS
75.0000 mg | ORAL_TABLET | Freq: Every day | ORAL | Status: DC
Start: 2014-03-23 — End: 2014-03-23
  Administered 2014-03-23: 75 mg
  Filled 2014-03-22: qty 1

## 2014-03-22 MED ORDER — SODIUM CHLORIDE 0.9 % IV SOLN
1.7500 mg/kg/h | INTRAVENOUS | Status: DC
  Filled 2014-03-22: qty 250

## 2014-03-22 MED ORDER — BIVALIRUDIN BOLUS VIA INFUSION
0.1000 mg/kg | Freq: Once | INTRAVENOUS | Status: DC
  Filled 2014-03-22: qty 9

## 2014-03-22 MED ORDER — CARVEDILOL 6.25 MG PO TABS
6.2500 mg | ORAL_TABLET | Freq: Two times a day (BID) | ORAL | Status: DC
  Filled 2014-03-22 (×4): qty 1

## 2014-03-22 NOTE — Progress Notes (Signed)
Patient ID: Leodis RainsCheryl C Jonas, female   DOB: 1955-12-20, 58 y.o.   MRN: [id number]      PROGRESS NOTE  Subjective:    Doing well this morning.  CVP 6.  Working with PT, tires easily.  Getting tube feeds.  No chest pain.    Objective:    Vital Signs:   Temp:  [97.3 F (36.3 C)-98.1 F (36.7 C)] 97.9 F (36.6 C) (09/30 0748) Pulse Rate:  [66-97] 85 (09/30 0802) Resp:  [17-23] 21 (09/30 0748) BP: (93-135)/(41-67) 135/55 mmHg (09/30 0802) SpO2:  [93 %-97 %] 96 % (09/30 0748) Weight:  [185 lb 13.6 oz (84.3 kg)] 185 lb 13.6 oz (84.3 kg) (09/30 0500)  Last BM Date: 03/20/14   24-hour weight change: Weight change: -11 lb 7.4 oz (-5.2 kg)  Weight trends: Filed Weights   03/20/14 0500 03/21/14 0447 03/22/14 0500  Weight: 197 lb 5 oz (89.5 kg) 197 lb 5 oz (89.5 kg) 185 lb 13.6 oz (84.3 kg)    Intake/Output:  09/29 0701 - 09/30 0700 In: 55 [I.V.:10; NG/GT:45] Out: 1550 [Urine:1550] Total I/O In: -  Out: 1100 [Urine:1100]   Physical Exam: BP 135/55  Pulse 85  Temp(Src) 97.9 F (36.6 C) (Oral)  Resp 21  Ht 5\' 2"  (1.575 m)  Wt 185 lb 13.6 oz (84.3 kg)  BMI 33.98 kg/m2  SpO2 96%  Wt Readings from Last 3 Encounters:  03/22/14 185 lb 13.6 oz (84.3 kg)  03/22/14 185 lb 13.6 oz (84.3 kg)  03/22/14 185 lb 13.6 oz (84.3 kg)    General: Vital signs reviewed and noted. Chronically ill appearing, NAD  Head: Normocephalic, atraumatic.  Eyes: conjunctivae/corneas clear.  EOM's intact.   Throat: normal  Neck:  JVD does not appear elevated  Lungs:  Decreased BS throughout no wheezing  Heart:  RRR, no s3 2/6 TR  Abdomen:  Soft, non-tender, non-distended    Extremities:  Warm no edema. L DP non palpable and R 2+, no edema, R PICC  Neurologic: Moves all 4  Psych: A&Ox3    Labs: BMET:  Recent Labs  03/21/14 0616 03/22/14 0500  NA 142 143  K 3.6* 3.8  CL 102 103  CO2 31 29  GLUCOSE 224* 132*  BUN 19 18  CREATININE 0.36* 0.36*  CALCIUM 8.4 8.5  MG  --  2.1  PHOS  --   3.9    Liver function tests:  Recent Labs  03/20/14 0500 03/22/14 0500  AST 17 22  ALT 16 13  ALKPHOS 47 54  BILITOT 0.3 0.4  PROT 5.5* 6.1  ALBUMIN 2.3* 2.4*   No results found for this basename: LIPASE, AMYLASE,  in the last 72 hours  CBC:  Recent Labs  03/22/14 0500  WBC 5.7  NEUTROABS 3.4  HGB 10.7*  HCT 34.4*  MCV 77.7*  PLT 256    Cardiac Enzymes: No results found for this basename: CKTOTAL, CKMB, TROPONINI,  in the last 72 hours  Coagulation Studies:  Recent Labs  03/22/14 0500  LABPROT 14.0  INR 1.08    Other: No components found with this basename: POCBNP,  No results found for this basename: DDIMER,  in the last 72 hours No results found for this basename: HGBA1C,  in the last 72 hours  Recent Labs  03/22/14 0500  CHOL 135  HDL 31*  LDLCALC 69  TRIG [id number]*  CHOLHDL 4.4   No results found for this basename: TSH, T4TOTAL, FREET3, T3FREE, THYROIDAB,  in the last  72 hours No results found for this basename: VITAMINB12, FOLATE, FERRITIN, TIBC, IRON, RETICCTPCT,  in the last 72 hours   Other results:  EKG :  NSR at 93.  Old ant. MI  Medications:    Infusions: . sodium chloride 10 mL/hr at 03/20/14 2124  . sodium chloride 10 mL/hr at 03/22/14 0654  . feeding supplement (JEVITY 1.2 CAL) 1,000 mL (03/20/14 2124)    Scheduled Medications: . aspirin  81 mg Per Tube Daily  . aspirin  81 mg Oral Pre-Cath  . atorvastatin  40 mg Per Tube q1800  . carvedilol  6.25 mg Per Tube BID WC  .  ceFAZolin (ANCEF) IV  2 g Intravenous 3 times per day  . clopidogrel  75 mg Oral Daily  . digoxin  0.125 mg Per Tube Daily  . escitalopram  10 mg Per Tube Daily  . feeding supplement (PRO-STAT SUGAR FREE 64)  30 mL Per Tube QID  . furosemide  40 mg Per Tube BID  . gabapentin  300 mg Per Tube Q8H  . heparin subcutaneous  5,000 Units Subcutaneous 3 times per day  . insulin aspart  0-20 Units Subcutaneous 6 times per day  . insulin glargine  20 Units  Subcutaneous Daily  . losartan  50 mg Per Tube Daily  . nitroGLYCERIN  0.4 mg Transdermal Q24H  . predniSONE  10 mg Per Tube Q breakfast  . sodium chloride  3 mL Intravenous Q12H  . spironolactone  25 mg Per Tube Daily    Assessment/ Plan:    1. NSTEMI with severe three-vessel coronary artery disease - not amenable to CABG with diffusely diseased LAD. LAD not amenable to PCI but could potentially intervene on RCA/LCx, LCx was likely the culprit vessel for NSTEMI.  Have discussed films with Dr. Eldridge Dace, plan intervention LCx and potentially RCA today.  She received 300 mg po Plavix yesterday.  2. Acute on chronic systolic congestive heart failure with an EF of 10-15% initially this hospitalization => up to 30-35% on last echo.  Ischemic cardiomyopathy.  Volume status much improved.  CVP 6.  - Continue po Lasix.  - Increase Coreg to 6.25 mg bid, continue losartan and spironolactone.  3. Acute respiratory failure: Resolved. 4. Fever/leukocytosis: MSSA bacteremia: no endocarditis on TEE. Will need 3 weeks of cefazolin. 5. Diabetes mellitus 6. COPD 7. Peripheral vascular disease: status post right iliac stent, status post fem - tibioperoneal trunk. L foot ulcerations 8. Continue to work with PT/OT, plan for CIR.   Marca Ancona 03/22/2014 8:24 AM

## 2014-03-22 NOTE — Interval H&P Note (Signed)
History and Physical Interval Note:  03/22/2014 1:48 PM  [name]  has presented today for surgery, with the diagnosis of cad  The various methods of treatment have been discussed with the patient and family. After consideration of risks, benefits and other options for treatment, the patient has consented to  Procedure(s): PERCUTANEOUS CORONARY STENT INTERVENTION (PCI-S) (N/A) as a surgical intervention .  The patient's history has been reviewed, patient examined, no change in status, stable for surgery.  I have reviewed the patient's chart and labs.  Questions were answered to the patient's satisfaction.    Cath Lab Visit (complete for each Cath Lab visit)  Clinical Evaluation Leading to the Procedure:   ACS: Yes.    Non-ACS:    Anginal Classification: CCS IV  Anti-ischemic medical therapy: Minimal Therapy (1 class of medications)  Non-Invasive Test Results: No non-invasive testing performed  Prior CABG: No previous CABG       Tonny BollmanMichael Briaunna Grindstaff

## 2014-03-22 NOTE — CV Procedure (Signed)
CARDIAC CATH NOTE  Name: [name] MRN: [id number] DOB: Sep 28, 1955  Procedure: PTCA and stenting of the left circumflex, PTCA and stenting of the proximal, mid, and distal-LAD  Indication: NSTEMI. This is a 58 year old woman with long-standing diabetes and extensive multivessel coronary artery disease. She has been critically ill with severe heart failure and underwent cardiac catheterization for non-ST elevation infarction early during her hospitalization. She was found to have severe three-vessel coronary artery disease but was turned down for surgery because of very poor distal targets and severe cardiomyopathy. She has clinically improved and was referred today for multivessel PCI. After careful review of her films, I elected to treat her left circumflex which was her presumed culprit vessel for her non-STEMI. I also decided to treat her LAD which was severely diseased but supply a large area of myocardium. The RCA had a moderate lesion and I plan to perform angiography to decide if it would require PCI. I reviewed the risks, indications, and alternatives to high risk PCI in detail with the patient and her sister. They understand and the patient now presents for her procedure.  Procedural Details: The right wrist was prepped, draped, and anesthetized with 1% lidocaine. Using the modified Seldinger technique, a 6 Fr sheath was introduced into the radial artery. 3 mg verapamil was administered through the radial sheath. Weight-based bivalirudin was given for anticoagulation. The patient has been preloaded with clopidogrel. Once a therapeutic ACT was achieved, a 6 [place] XB LAD 3.5 cm guide catheter was inserted.  Attention was first turned to the left circumflex.the vessel is heavily calcified. In the mid circumflex extending into the obtuse marginal branch there is a critical 99% stenosis. A cougar coronary guidewire was used to cross the lesion with a moderate amount of difficulty.  The  lesion was predilated with a 2.0 mm balloon.  I tried to pass a 2.25 x 24 mm Promus drug-eluting stent which was going to treat the mid circumflex into the first obtuse marginal branch. The stent would not pass beyond the mid circumflex. I used a grand slam wire as a buddy wire. After a lot of manipulation, I was finally able to get the stent across the lesion it was deployed at 11 atmospheres. The stent was postdilated with a 2.5 mm noncompliant balloon to 14 atmospheres. The stent was delivered over the grand slam wire. A cougar wire was removed. Final angiography demonstrated an angiographic result. There was 0% residual stenosis and TIMI-3 flow in the vessel. Attention was then turned to the LAD. The LAD had heavily calcified eccentric plaque in the proximal vessel. The mid vessel had severe sequential lesions extending into the distal vessel. The vessel was wired with a cougar guidewire. The same 2.0 mm balloon was advanced into the distal vessel and multiple inflations were performed. There was poor flow after balloon dilatation. The proximal vessel in an area of heavy calcification did not demonstrate area good balloon expansion. In order to treat the distal vessel, a 2.25 x 32 mm Promus DES was deployed 11 atmospheres. The distal portion of the stent did not appear well expanded. It became clear that the LAD would require treatment although it back to the proximal vessel. A second 2.25 x 32 mm Promus drug-eluting stent was deployed in overlapping fashion at 14 atmospheres. This treated the severe stenosis in the mid vessel. Finally, a 2.25 x 28 mm Promus DES was deployed in the proximal vessel at 14 atmospheres. This was also overlap with  the mid stent. The distal stent was postdilated with a 2.25 mm noncompliant balloon to 14 atmospheres. The mid vessel stent was postdilated with a 2.25 mm noncompliant balloon to 18 atmospheres. The proximal vessel was postdilated with a 275 mm noncompliant balloon to 16  atmospheres. At the completion of the procedure there was 0% residual stenosis and TIMI-3 flow in all 3 lesion site. The side branches remained patent with TIMI-3 flow.   Finally, a JR 4 guide catheter was inserted into the right coronary artery. Multiple angiographic views were obtained. There was a moderately tight stenosis in the mid RCA which I decided to treat medically. I did not feel that this was likely flow obstructive and considering the severity of her other disease, I felt she had obtained maximum benefit from her PCI procedure.  This procedure was complex because of extensive diffuse disease, heavy calcification, need for buddy wiring, and need for extensive predilatation postdilatation in order to deliver and expand multiple DES platforms.  There were no immediate procedural complications. A TR band was used for radial hemostasis. The patient was transferred to the post catheterization recovery area for further monitoring.  Lesion Data: Lesion 1: Vessel: LCx/mid Percent stenosis (pre): 99 TIMI-flow (pre):  3 Stent:  2.25x24 mm Promus DES Percent stenosis (post): 0 TIMI-flow (post): 3  Lesion 2 Vessel: LAD/distal Percent stenosis (pre): 90 TIMI-flow (pre):  3 Stent:  2.25x32 mm Promus DES Percent stenosis (post): 0 TIMI-flow (post): 3  Lesion 3: Vessel: LAD/mid Percent stenosis (pre): 95 TIMI-flow (pre):  3 Stent:  2.25x32 mm Promus DES Percent stenosis (post): 0 TIMI-flow (post): 3  Lesion 4: Vessel: LAD/prox Percent stenosis (pre): 80 TIMI-flow (pre):  3 Stent:  2.25x28 mm Promus DES Percent stenosis (post): 0 TIMI-flow (post): 3  Contrast: 195 cc  Radiation dose/Fluoro time: 23.5 minutes  Estimated Blood Loss: minimal  Conclusions: Successful complex PCI of the mid circumflex, and proximal/mid/distal LAD as detailed above  Recommendations: Lifelong dual antiplatelet therapy with aspirin and clopidogrel. Check P2Y12 testing in am and consider change to  Brilinta if clopidogrel hyporesponsiveness demonstrated.  Tonny Bollman MD, [facility] 03/22/2014, 3:56 PM

## 2014-03-22 NOTE — Progress Notes (Signed)
Speech Language Pathology Treatment: Dysphagia  Patient Details Name: Leodis RainsCheryl C Rump MRN: [id number] DOB: 21-Jun-1956 Today's Date: 03/22/2014 Time: [id number] SLP Time Calculation (min): 19 min  Assessment / Plan / Recommendation Clinical Impression  F/u for therapeutic exercises for swallowing.  Pt instructed to perform hard glottal attack, high pitched "e" for vocal cord adduction, and Mendelsohn maneuver.  Overall min verbal/tactile cues for elicitation.  Pt with subjectively improved quality of phonation today.  Will continue efforts with plan to repeat FEES over course of next few days.   HPI HPI: 58 year old female with h/o COPD, DM, peripheral vascular disease, admitted worsening left lower extremity pain and  and abdominal pain, developed acute hypoxic respiratory failure due to pulmonary edema requiring intubation 9/14-9/21, extubated and reintubated 9/21-9/23, NSTEMI, MSSA bacteremia after recent angiogram. FEES on 9/24 revealed severe acute reversible dysphagia characterized by intubation related sensory deficits, generalized weakness, and moderately severe laryngeal and pharyngeal edema resulting in a delayed swallow initiation and decreased laryngeal closure. Deep penetration and aspiration with inconsistent sensation of all consistencies.  NG placed.  MBS today to determine any potential for conservative POs.     Pertinent Vitals Pain Assessment: 0-10 Pain Score: 0-No pain  SLP Plan  Continue with current plan of care    Recommendations Diet recommendations: NPO Medication Administration: Via alternative means              Oral Care Recommendations: Oral care Q4 per protocol;Oral care prior to ice chips Follow up Recommendations:  (tbd) Plan: Continue with current plan of care       Tareka Jhaveri L. Samson Fredericouture, [place] CCC/SLP Pager [phone]  Blenda MountsCouture, Haruki Arnold Laurice 03/22/2014, 8:14 AM

## 2014-03-22 NOTE — Progress Notes (Signed)
PT Cancellation Note  Patient Details Name: Kaitlin RainsCheryl C Moorman MRN: [id number] DOB: February 21, 1956   Cancelled Treatment:    Reason Eval/Treat Not Completed: Patient at procedure or test/unavailable. At cath lab   Remmi Armenteros 03/22/2014, 1:45 PM Pager [phone]

## 2014-03-22 NOTE — Progress Notes (Signed)
Patient is for cardiac cath today.   She already watched the video.  Encouraged to ask questions or concerns.  She did verbalized fear.  She stated that the last time she had a cardiac cath, the MD had difficulty inserting the wire and that she had asked them to stop the procedure.     She has been NPO since midnight.  Her sister, Garlan FairMinnie, is at the bedside before she was transported to the cath lab.

## 2014-03-22 NOTE — Progress Notes (Signed)
Rehab admissions - I am following pt's case and made note that pt is having cardiac cath later today. I updated [employer] RN reviewer about pt's status and will give further updates to insurance tomorrow. I will need to see how pt progresses with therapies after the cardiac cath to share with insurance. [employer] is concerned that pt may not have the activity tolerance to tolerate 3 hours of intensive inpatient rehab. Our rehab MD feels pt is a good candidate for our rehab program.   I updated pt and her sister on the plan to see how she is doing after the cardiac cath. I updated [name], case Production designer, theatre/television/filmmanager as well.   I will share update tomorrow. Please call me with any questions. Thanks.  Kaitlin MuleJanine Jenness [name], PT Rehabilitation Admissions Coordinator [phone]

## 2014-03-22 NOTE — H&P (View-Only) (Signed)
Patient ID: Kaitlin RainsCheryl C [name], female   DOB: 1955-12-20, 58 y.o.   MRN: [id number]      PROGRESS NOTE  Subjective:    Doing well this morning.  CVP 6.  Working with PT, tires easily.  Getting tube feeds.  No chest pain.    Objective:    Vital Signs:   Temp:  [97.3 F (36.3 C)-98.1 F (36.7 C)] 97.9 F (36.6 C) (09/30 0748) Pulse Rate:  [66-97] 85 (09/30 0802) Resp:  [17-23] 21 (09/30 0748) BP: (93-135)/(41-67) 135/55 mmHg (09/30 0802) SpO2:  [93 %-97 %] 96 % (09/30 0748) Weight:  [185 lb 13.6 oz (84.3 kg)] 185 lb 13.6 oz (84.3 kg) (09/30 0500)  Last BM Date: 03/20/14   24-hour weight change: Weight change: -11 lb 7.4 oz (-5.2 kg)  Weight trends: Filed Weights   03/20/14 0500 03/21/14 0447 03/22/14 0500  Weight: 197 lb 5 oz (89.5 kg) 197 lb 5 oz (89.5 kg) 185 lb 13.6 oz (84.3 kg)    Intake/Output:  09/29 0701 - 09/30 0700 In: 55 [I.V.:10; NG/GT:45] Out: 1550 [Urine:1550] Total I/O In: -  Out: 1100 [Urine:1100]   Physical Exam: BP 135/55  Pulse 85  Temp(Src) 97.9 F (36.6 C) (Oral)  Resp 21  Ht 5\' 2"  (1.575 m)  Wt 185 lb 13.6 oz (84.3 kg)  BMI 33.98 kg/m2  SpO2 96%  Wt Readings from Last 3 Encounters:  03/22/14 185 lb 13.6 oz (84.3 kg)  03/22/14 185 lb 13.6 oz (84.3 kg)  03/22/14 185 lb 13.6 oz (84.3 kg)    General: Vital signs reviewed and noted. Chronically ill appearing, NAD  Head: Normocephalic, atraumatic.  Eyes: conjunctivae/corneas clear.  EOM's intact.   Throat: normal  Neck:  JVD does not appear elevated  Lungs:  Decreased BS throughout no wheezing  Heart:  RRR, no s3 2/6 TR  Abdomen:  Soft, non-tender, non-distended    Extremities:  Warm no edema. L DP non palpable and R 2+, no edema, R PICC  Neurologic: Moves all 4  Psych: A&Ox3    Labs: BMET:  Recent Labs  03/21/14 0616 03/22/14 0500  NA 142 143  K 3.6* 3.8  CL 102 103  CO2 31 29  GLUCOSE 224* 132*  BUN 19 18  CREATININE 0.36* 0.36*  CALCIUM 8.4 8.5  MG  --  2.1  PHOS  --   3.9    Liver function tests:  Recent Labs  03/20/14 0500 03/22/14 0500  AST 17 22  ALT 16 13  ALKPHOS 47 54  BILITOT 0.3 0.4  PROT 5.5* 6.1  ALBUMIN 2.3* 2.4*   No results found for this basename: LIPASE, AMYLASE,  in the last 72 hours  CBC:  Recent Labs  03/22/14 0500  WBC 5.7  NEUTROABS 3.4  HGB 10.7*  HCT 34.4*  MCV 77.7*  PLT 256    Cardiac Enzymes: No results found for this basename: CKTOTAL, CKMB, TROPONINI,  in the last 72 hours  Coagulation Studies:  Recent Labs  03/22/14 0500  LABPROT 14.0  INR 1.08    Other: No components found with this basename: POCBNP,  No results found for this basename: DDIMER,  in the last 72 hours No results found for this basename: HGBA1C,  in the last 72 hours  Recent Labs  03/22/14 0500  CHOL 135  HDL 31*  LDLCALC 69  TRIG [id number]*  CHOLHDL 4.4   No results found for this basename: TSH, T4TOTAL, FREET3, T3FREE, THYROIDAB,  in the last  72 hours No results found for this basename: VITAMINB12, FOLATE, FERRITIN, TIBC, IRON, RETICCTPCT,  in the last 72 hours   Other results:  EKG :  NSR at 93.  Old ant. MI  Medications:    Infusions: . sodium chloride 10 mL/hr at 03/20/14 2124  . sodium chloride 10 mL/hr at 03/22/14 0654  . feeding supplement (JEVITY 1.2 CAL) 1,000 mL (03/20/14 2124)    Scheduled Medications: . aspirin  81 mg Per Tube Daily  . aspirin  81 mg Oral Pre-Cath  . atorvastatin  40 mg Per Tube q1800  . carvedilol  6.25 mg Per Tube BID WC  .  ceFAZolin (ANCEF) IV  2 g Intravenous 3 times per day  . clopidogrel  75 mg Oral Daily  . digoxin  0.125 mg Per Tube Daily  . escitalopram  10 mg Per Tube Daily  . feeding supplement (PRO-STAT SUGAR FREE 64)  30 mL Per Tube QID  . furosemide  40 mg Per Tube BID  . gabapentin  300 mg Per Tube Q8H  . heparin subcutaneous  5,000 Units Subcutaneous 3 times per day  . insulin aspart  0-20 Units Subcutaneous 6 times per day  . insulin glargine  20 Units  Subcutaneous Daily  . losartan  50 mg Per Tube Daily  . nitroGLYCERIN  0.4 mg Transdermal Q24H  . predniSONE  10 mg Per Tube Q breakfast  . sodium chloride  3 mL Intravenous Q12H  . spironolactone  25 mg Per Tube Daily    Assessment/ Plan:    1. NSTEMI with severe three-vessel coronary artery disease - not amenable to CABG with diffusely diseased LAD. LAD not amenable to PCI but could potentially intervene on RCA/LCx, LCx was likely the culprit vessel for NSTEMI.  Have discussed films with Dr. Eldridge Dace, plan intervention LCx and potentially RCA today.  She received 300 mg po Plavix yesterday.  2. Acute on chronic systolic congestive heart failure with an EF of 10-15% initially this hospitalization => up to 30-35% on last echo.  Ischemic cardiomyopathy.  Volume status much improved.  CVP 6.  - Continue po Lasix.  - Increase Coreg to 6.25 mg bid, continue losartan and spironolactone.  3. Acute respiratory failure: Resolved. 4. Fever/leukocytosis: MSSA bacteremia: no endocarditis on TEE. Will need 3 weeks of cefazolin. 5. Diabetes mellitus 6. COPD 7. Peripheral vascular disease: status post right iliac stent, status post fem - tibioperoneal trunk. L foot ulcerations 8. Continue to work with PT/OT, plan for CIR.   Marca Ancona 03/22/2014 8:24 AM

## 2014-03-22 NOTE — Progress Notes (Addendum)
[facility] TEAM 1 - Stepdown/ICU TEAM Progress Note  [name] NFA:[id number] DOB: December 13, 1955 DOA: 03/05/2014 PCP: [name]  Admit HPI / Brief Narrative45: 57 yo female with history of peripheral vascular disease who had right lower extremity runoff 03/01/2014 presented to the ER 9/13 because of worsening left lower extremity pain over 2 days. In the ER patient was seen by Vascular Surgery who did not feel the patient's left lower extremity pain was vascular in origin. In the ER patient was found to be hypotensive and febrile. Blood cultures were obtained with urine cultures. Chest x-ray and UA were unremarkable. On exam patient had mild discharge from the right groin wound which patient stated had been there for 6 weeks. Patient also had mild skin discoloration in the left groin area where she had catheterization.   She went on to develop MSSA positive blood cultures and also Pseudomonas was cultured from her groin wound. She subsequently developed probable aspiration pneumonia then elevated cardiac enzymes. She had severe LV dysfunction on her echocardiogram as well as a cardiac cath noting multivessel CAD, but the vessels were poor targets for grafting. The patient was intubated for 10 days and extubated. She had evidence of persistent pleural effusions from heart failure.  With her sepsis, heart failure, ischemic cardiomyopathy with poor targets, and general deconditioning, malnutrition, and peripheral vascular disease she would not be candidate for CABG. She has had her saphenous vein harvested from her right leg for femoropopliteal bypass and the left leg has evidence of severe venous stasis and probably poor quality conduit.   Significant Events:  9/13 PM - admitted  9/14 hypoxia, hypotension, +Trop I - intubated  9/15 pos BC MSSA  9/16 TEE > see below 9/17 neg 2 liters, still bronchospastic 9/19 extubated  9/21 acute hypoxemic respiratory failure, re-intubated, Cardiology  started milrinone  9/23 thora left side 500c removed, extubated  9/24 failed swallow evaluation; TCTS consult > not a CABG candidate, rec medical management, transfused 1 U PRBC 9/26 TRH assumed care  HPI/Subjective: Pt seen on 2H post cath.  She is in good spirits with no complaints, apart from being "real tired" in general.  Denies cp or sob.    Assessment/Plan:  Acute hypoxic resp failure due to ischemia and flash edema  Resolved - extubated  Bilateral effusions L >R > transudate only 500cc removed 9/23 from left - no respiratory distress at this time   NSTEMI with severe three-vessel CAD - not a CABG candidate  Care as per Cardiology - s/p complex PCI of multiple lesions - will need "lifelong dual antiplatelet therapy with aspirin and clopidogrel"  Ischemic cardiomyopathy / Acute on chronic systolic congestive heart failure with an EF of 10-15%  Care as per CHF Team   Dysphagia Felt to be due to deconditioning + damage/irritation related to intubation/ET tube - continue NG feeds - SLP following - hopeful to advance diet soon   Septic shock resolved   MSSA bacteremia 2/2 blood cx  after recent angiogram but source uncertain - TEE negative, cultures now clear - cefazolin 3 weeks total per ID (stop date 03/25/2014) - repeat blood cx 9/22 remarkable for 1/2 coag negative Staph c/w a contaminant   ?HCAP 9/22 > doubt quantitative culture negative from BAL   Pseudomonas R groin wound   Tobacco abuse   COPD w/ acute exacerbation  Acute exacerbation has resolved - stable at this time   Hypophosphatemia Resolved   Hypokalemia  Resolved   Chronic anemia  Hgb stable/climbing   L inguinal hematoma without evidence of bleeding  DM  CBG erratic as pt NPO much of day - follow w/o change today   Code Status: FULL Family Communication: no family present at time of exam  Disposition Plan: SDU  Consultants: CHF Team TCTS PCCM > TRH  Procedures: 9/14 Echo- EF 30-35%    9/16 TEE- no LAA thrombus, no valvular vegetation, anterior, anteroseptal, apical, and inferioapical hypokinesis, EF 35%  9/17 EKG- sinus tach, low voltage QRS, cannot r/o anteroseptal infarct  9/18 LHC- 40% distal left main, severe diffuse disease from LAD, 95% stenosis circ, focal 75% mid RCA, LVEF 15%  9/21 L groin vascular ultrasound >>> no evidence of complication around graft site  9/29 - cardiac cath - PTCA and stenting of the left circumflex, PTCA and stenting of the proximal, mid, and distal-LAD  Antibiotics: Vanc 9/14 > 9/17 + 9/22 > 9/25  Cefepime 9/14 > 9/23  Ancef 9/21 + 9/25 > Doxycycline 9/24 > 9/28  DVT prophylaxis: SQ heparin   Objective: Blood pressure 120/45, pulse 83, temperature 97.6 F (36.4 C), temperature source Oral, resp. rate 20, height 5\' 2"  (1.575 m), weight 84.3 kg (185 lb 13.6 oz), SpO2 97.00%.  Intake/Output Summary (Last 24 hours) at 03/22/14 1639 Last data filed at 03/22/14 1232  Gross per 24 hour  Intake     23 ml  Output   2300 ml  Net  -2277 ml   Exam: General: No acute respiratory distress Lungs: Clear to auscultation bilaterally without wheezes or crackles Cardiovascular: Regular rate and rhythm without murmur gallop or rub Abdomen: obese, nontender, nondistended, soft, bowel sounds positive, no rebound, no ascites, no appreciable mass Extremities: No significant cyanosis, clubbing;  trace edema bilateral lower extremities  Data Reviewed: Basic Metabolic Panel:  Recent Labs Lab 03/17/14 1505 03/18/14 0355 03/18/14 1525 03/19/14 0630 03/19/14 0901 03/20/14 0500 03/21/14 0616 03/22/14 0500  NA  --  141  --   --  141 144 142 143  K  --  3.9  --   --  3.8 3.7 3.6* 3.8  CL  --  104  --   --  102 106 102 103  CO2  --  29  --   --  28 29 31 29   GLUCOSE  --  207*  --   --  226* 185* 224* 132*  BUN  --  17  --   --  16 17 19 18   CREATININE  --  0.37*  --   --  0.34* 0.34* 0.36* 0.36*  CALCIUM  --  8.0*  --   --  8.0* 7.9* 8.4 8.5   MG 2.0 2.0 2.1 2.0  --   --   --  2.1  PHOS 3.0 4.3 3.6 3.2  --   --   --  3.9    Liver Function Tests:  Recent Labs Lab 03/15/14 1830 03/20/14 0500 03/22/14 0500  AST  --  17 22  ALT  --  16 13  ALKPHOS  --  47 54  BILITOT  --  0.3 0.4  PROT 5.9* 5.5* 6.1  ALBUMIN  --  2.3* 2.4*   CBC:  Recent Labs Lab 03/16/14 0500 03/17/14 0400 03/19/14 0630 03/22/14 0500  WBC 8.7 6.3 6.4 5.7  NEUTROABS  --   --   --  3.4  HGB 7.7* 9.1* 9.5* 10.7*  HCT 26.0* 29.7* 30.8* 34.4*  MCV 75.1* 74.1* 73.9* 77.7*  PLT 198 196  236 256    CBG:  Recent Labs Lab 03/21/14 1959 03/22/14 0019 03/22/14 0435 03/22/14 0752 03/22/14 1233  GLUCAP 186* 141* 155* 143* 245*    Recent Results (from the past 240 hour(s))  CULTURE, BLOOD (ROUTINE X 2)     Status: None   Collection Time    03/14/14 11:06 AM      Result Value Ref Range Status   Specimen Description BLOOD LEFT HAND   Final   Special Requests BOTTLES DRAWN AEROBIC AND ANAEROBIC 10CC   Final   Culture  Setup Time     Final   Value: 03/14/2014 16:45     Performed at [facility]   Culture     Final   Value: STAPHYLOCOCCUS SPECIES (COAGULASE NEGATIVE)     Note: THE SIGNIFICANCE OF ISOLATING THIS ORGANISM FROM A SINGLE SET OF BLOOD CULTURES WHEN MULTIPLE SETS ARE DRAWN IS UNCERTAIN. PLEASE NOTIFY THE MICROBIOLOGY DEPARTMENT WITHIN ONE WEEK IF SPECIATION AND SENSITIVITIES ARE REQUIRED.     Note: Gram Stain Report Called to,Read Back By and Verified With: MONICA CROSS TAKEN FOR NURSE 12:55AM 03/16/14 THOMI     Performed at [facility]   Report Status 03/17/2014 FINAL   Final  CULTURE, BLOOD (ROUTINE X 2)     Status: None   Collection Time    03/14/14 11:16 AM      Result Value Ref Range Status   Specimen Description BLOOD RIGHT HAND   Final   Special Requests BOTTLES DRAWN AEROBIC AND ANAEROBIC 10CC   Final   Culture  Setup Time     Final   Value: 03/14/2014 16:43     Performed at [facility]   Culture      Final   Value: NO GROWTH 5 DAYS     Performed at [facility]   Report Status 03/20/2014 FINAL   Final  CULTURE, BAL-QUANTITATIVE     Status: None   Collection Time    03/14/14 12:47 PM      Result Value Ref Range Status   Specimen Description BRONCHIAL ALVEOLAR LAVAGE   Final   Special Requests Normal   Final   Gram Stain     Final   Value: FEW WBC PRESENT,BOTH PMN AND MONONUCLEAR     RARE SQUAMOUS EPITHELIAL CELLS PRESENT     NO ORGANISMS SEEN     Performed at [facility] Count     Final   Value: 10,000 COLONIES/ML     Performed at [facility]   Culture     Final   Value: Non-Pathogenic Oropharyngeal-type Flora Isolated.     Performed at [facility]   Report Status 03/16/2014 FINAL   Final  AFB CULTURE WITH SMEAR     Status: None   Collection Time    03/15/14  2:33 PM      Result Value Ref Range Status   Specimen Description FLUID LEFT PLEURAL   Final   Special Requests NONE   Final   Acid Fast Smear     Final   Value: NO ACID FAST BACILLI SEEN     Performed at [facility]   Culture     Final   Value: CULTURE WILL BE EXAMINED FOR 6 WEEKS BEFORE ISSUING A FINAL REPORT     Performed at [facility]   Report Status PENDING   Incomplete  BODY FLUID CULTURE     Status: None  Collection Time    03/15/14  2:33 PM      Result Value Ref Range Status   Specimen Description FLUID LEFT PLEURAL   Final   Special Requests NONE   Final   Gram Stain     Final   Value: RARE WBC PRESENT, PREDOMINANTLY MONONUCLEAR     NO ORGANISMS SEEN     Performed at [facility]   Culture     Final   Value: NO GROWTH 3 DAYS     Performed at [facility]   Report Status 03/19/2014 FINAL   Final  FUNGUS CULTURE W SMEAR     Status: None   Collection Time    03/15/14  2:33 PM      Result Value Ref Range Status   Specimen Description PLEURAL LEFT   Final   Special Requests NONE   Final   Fungal Smear     Final    Value: NO YEAST OR FUNGAL ELEMENTS SEEN     Performed at [facility]   Culture     Final   Value: CULTURE IN PROGRESS FOR FOUR WEEKS     Performed at [facility]   Report Status PENDING   Incomplete     Studies:  Recent x-ray studies have been reviewed in detail by the Attending Physician  Scheduled Meds:  Scheduled Meds: . aspirin  81 mg Per Tube Daily  . atorvastatin  40 mg Per Tube q1800  . bivalirudin  0.1 mg/kg Intravenous Once   And  . bivalirudin (ANGIOMAX) infusion 5 mg/mL (Cath Lab,ACS,PCI indication)  1.75 mg/kg/hr Intravenous To Cath  . bivalirudin (ANGIOMAX) infusion 5 mg/mL (Cath Lab,ACS,PCI indication)  0.25 mg/kg/hr Intravenous Once  . carvedilol  6.25 mg Per Tube BID WC  .  ceFAZolin (ANCEF) IV  2 g Intravenous 3 times per day  . clopidogrel  75 mg Oral Daily  . digoxin  0.125 mg Per Tube Daily  . escitalopram  10 mg Per Tube Daily  . feeding supplement (PRO-STAT SUGAR FREE 64)  30 mL Per Tube QID  . furosemide  40 mg Per Tube BID  . gabapentin  300 mg Per Tube Q8H  . heparin subcutaneous  5,000 Units Subcutaneous 3 times per day  . insulin aspart  0-20 Units Subcutaneous 6 times per day  . insulin glargine  20 Units Subcutaneous Daily  . losartan  50 mg Per Tube Daily  . nitroGLYCERIN  0.4 mg Transdermal Q24H  . predniSONE  10 mg Per Tube Q breakfast  . spironolactone  25 mg Per Tube Daily    Time spent on care of this patient: 25 mins   [facility] T , [name]   [facility] Office  [phone] Pager - Text Page per Loretha Stapler as per below:  On-Call/Text Page:      [email or web address]      password [email or web address]  If 7PM-7AM, please contact night-coverage [email or web address] Password [email or web address] 03/22/2014, 4:39 PM   LOS: 17 days

## 2014-03-23 DIAGNOSIS — M86169 Other acute osteomyelitis, unspecified tibia and fibula: Secondary | ICD-10-CM

## 2014-03-23 DIAGNOSIS — E1169 Type 2 diabetes mellitus with other specified complication: Secondary | ICD-10-CM

## 2014-03-23 DIAGNOSIS — E119 Type 2 diabetes mellitus without complications: Secondary | ICD-10-CM

## 2014-03-23 DIAGNOSIS — L089 Local infection of the skin and subcutaneous tissue, unspecified: Secondary | ICD-10-CM

## 2014-03-23 DIAGNOSIS — G9340 Encephalopathy, unspecified: Secondary | ICD-10-CM

## 2014-03-23 LAB — CARBOXYHEMOGLOBIN
Carboxyhemoglobin: 1.9 % — ABNORMAL HIGH (ref 0.5–1.5)
Methemoglobin: 0.4 % (ref 0.0–1.5)
O2 SAT: 58.7 %
Total hemoglobin: 11.9 g/dL — ABNORMAL LOW (ref 12.0–16.0)

## 2014-03-23 LAB — BASIC METABOLIC PANEL
Anion gap: 9 (ref 5–15)
BUN: 17 mg/dL (ref 6–23)
CO2: 30 mEq/L (ref 19–32)
Calcium: 8.6 mg/dL (ref 8.4–10.5)
Chloride: 104 mEq/L (ref 96–112)
Creatinine, Ser: 0.39 mg/dL — ABNORMAL LOW (ref 0.50–1.10)
GFR calc Af Amer: >90 mL/min (ref >90)
GLUCOSE: 178 mg/dL — AB (ref 70–99)
Potassium: 3.7 mEq/L (ref 3.7–5.3)
Sodium: 143 mEq/L (ref 137–147)

## 2014-03-23 LAB — GLUCOSE, CAPILLARY
GLUCOSE-CAPILLARY: 170 mg/dL — AB (ref 70–99)
GLUCOSE-CAPILLARY: 231 mg/dL — AB (ref 70–99)
Glucose-Capillary: 156 mg/dL — ABNORMAL HIGH (ref 70–99)
Glucose-Capillary: 174 mg/dL — ABNORMAL HIGH (ref 70–99)
Glucose-Capillary: 175 mg/dL — ABNORMAL HIGH (ref 70–99)
Glucose-Capillary: 299 mg/dL — ABNORMAL HIGH (ref 70–99)

## 2014-03-23 LAB — PLATELET INHIBITION P2Y12: Platelet Function  P2Y12: 152 [PRU] — ABNORMAL LOW (ref 194–418)

## 2014-03-23 LAB — CBC
HEMATOCRIT: 33.9 % — AB (ref 36.0–46.0)
HEMOGLOBIN: 10.3 g/dL — AB (ref 12.0–15.0)
MCH: 23 pg — AB (ref 26.0–34.0)
MCHC: 30.4 g/dL (ref 30.0–36.0)
MCV: 75.8 fL — AB (ref 78.0–100.0)
Platelets: 266 10*3/uL (ref 150–400)
RBC: 4.47 MIL/uL (ref 3.87–5.11)
RDW: 20.6 % — ABNORMAL HIGH (ref 11.5–15.5)
WBC: 5.4 10*3/uL (ref 4.0–10.5)

## 2014-03-23 MED ORDER — FUROSEMIDE 40 MG PO TABS
40.0000 mg | ORAL_TABLET | Freq: Two times a day (BID) | ORAL | Status: DC
  Administered 2014-03-23 – 2014-03-24 (×2): 40 mg via ORAL
  Filled 2014-03-23 (×3): qty 1

## 2014-03-23 MED ORDER — LOSARTAN POTASSIUM 50 MG PO TABS
50.0000 mg | ORAL_TABLET | Freq: Every day | ORAL | Status: DC
  Administered 2014-03-24 – 2014-03-26 (×3): 50 mg via ORAL
  Filled 2014-03-23 (×3): qty 1

## 2014-03-23 MED ORDER — ESCITALOPRAM OXALATE 10 MG PO TABS
10.0000 mg | ORAL_TABLET | Freq: Every day | ORAL | Status: DC
  Administered 2014-03-24 – 2014-03-27 (×4): 10 mg via ORAL
  Filled 2014-03-23 (×4): qty 1

## 2014-03-23 MED ORDER — CLOPIDOGREL BISULFATE 75 MG PO TABS
75.0000 mg | ORAL_TABLET | Freq: Every day | ORAL | Status: DC
Start: 2014-03-24 — End: 2014-03-27
  Administered 2014-03-24 – 2014-03-27 (×4): 75 mg via ORAL
  Filled 2014-03-23 (×4): qty 1

## 2014-03-23 MED ORDER — ALTEPLASE 2 MG IJ SOLR
2.0000 mg | Freq: Once | INTRAMUSCULAR | Status: DC
  Filled 2014-03-23: qty 2

## 2014-03-23 MED ORDER — ASPIRIN 81 MG PO CHEW
81.0000 mg | CHEWABLE_TABLET | Freq: Every day | ORAL | Status: DC
  Administered 2014-03-24 – 2014-03-27 (×4): 81 mg via ORAL
  Filled 2014-03-23 (×3): qty 1

## 2014-03-23 MED ORDER — ATORVASTATIN CALCIUM 80 MG PO TABS
80.0000 mg | ORAL_TABLET | Freq: Every day | ORAL | Status: DC
  Administered 2014-03-24 – 2014-03-26 (×3): 80 mg via ORAL
  Filled 2014-03-23 (×4): qty 1

## 2014-03-23 MED ORDER — BISOPROLOL FUMARATE 5 MG PO TABS
5.0000 mg | ORAL_TABLET | Freq: Every day | ORAL | Status: DC
  Administered 2014-03-23 – 2014-03-27 (×4): 5 mg via ORAL
  Filled 2014-03-23 (×5): qty 1

## 2014-03-23 MED ORDER — SODIUM CHLORIDE 0.9 % IJ SOLN
10.0000 mL | INTRAMUSCULAR | Status: DC | PRN
Start: 2014-03-23 — End: 2014-03-27
  Administered 2014-03-25 – 2014-03-27 (×5): 10 mL

## 2014-03-23 MED ORDER — SPIRONOLACTONE 25 MG PO TABS
25.0000 mg | ORAL_TABLET | Freq: Every day | ORAL | Status: DC
  Administered 2014-03-24 – 2014-03-25 (×2): 25 mg via ORAL
  Filled 2014-03-23 (×3): qty 1

## 2014-03-23 MED ORDER — ATORVASTATIN CALCIUM 80 MG PO TABS
80.0000 mg | ORAL_TABLET | Freq: Every day | ORAL | Status: DC
  Administered 2014-03-23: 80 mg
  Filled 2014-03-23: qty 1

## 2014-03-23 MED ORDER — PREDNISONE 10 MG PO TABS
10.0000 mg | ORAL_TABLET | Freq: Every day | ORAL | Status: DC
  Administered 2014-03-24 – 2014-03-27 (×4): 10 mg via ORAL
  Filled 2014-03-23 (×6): qty 1

## 2014-03-23 MED ORDER — SODIUM CHLORIDE 0.9 % IJ SOLN
10.0000 mL | Freq: Two times a day (BID) | INTRAMUSCULAR | Status: DC
  Administered 2014-03-23 – 2014-03-24 (×2): 10 mL

## 2014-03-23 MED ORDER — GABAPENTIN 300 MG PO CAPS
300.0000 mg | ORAL_CAPSULE | Freq: Three times a day (TID) | ORAL | Status: DC
Start: 2014-03-23 — End: 2014-03-27
  Administered 2014-03-23 – 2014-03-27 (×11): 300 mg via ORAL
  Filled 2014-03-23 (×13): qty 1

## 2014-03-23 NOTE — Progress Notes (Addendum)
Patient ID: [name]  female  ZOX:[id number]    DOB: 06/02/1956    DOA: 03/05/2014  PCP: Kirk Ruths, MD   Admit HPI / Brief Narrative:  58 yo female with history of peripheral vascular disease who had right lower extremity runoff 03/01/2014 presented to the ER 9/13 because of worsening left lower extremity pain over 2 days. In the ER patient was seen by Vascular Surgery who did not feel the patient's left lower extremity pain was vascular in origin. In the ER patient was found to be hypotensive and febrile. Blood cultures were obtained with urine cultures. Chest x-ray and UA were unremarkable. On exam patient had mild discharge from the right groin wound which patient stated had been there for 6 weeks. Patient also had mild skin discoloration in the left groin area where she had catheterization.  She went on to develop MSSA positive blood cultures and also Pseudomonas was cultured from her groin wound. She subsequently developed probable aspiration pneumonia then elevated cardiac enzymes. She had severe LV dysfunction on her echocardiogram as well as a cardiac cath noting multivessel CAD, but the vessels were poor targets for grafting. The patient was intubated for 10 days and extubated. She had evidence of persistent pleural effusions from heart failure. With her sepsis, heart failure, ischemic cardiomyopathy with poor targets, and general deconditioning, malnutrition, and peripheral vascular disease she would not be candidate for CABG. She has had her saphenous vein harvested from her right leg for femoropopliteal bypass and the left leg has evidence of severe venous stasis and probably poor quality conduit.    Assessment/Plan:  Acute hypoxic resp failure due to ischemia and flash edema  Resolved - extubated   Bilateral effusions L >R > transudate  only 500cc removed 9/23 from left - no respiratory distress at this time   NSTEMI with severe three-vessel CAD - not a CABG candidate    Care as per Cardiology - s/p complex PCI of multiple lesions - will need "lifelong dual antiplatelet therapy with aspirin and clopidogrel"   Ischemic cardiomyopathy / Acute on chronic systolic congestive heart failure with an EF of 10-15%  Care as per CHF Team   Dysphagia  - Felt to be due to deconditioning + damage/irritation related to intubation/ET tube - ordered FEES study, diet order per speech therapist--> patient has passed a swallow study in, started on dysphagia 1 diet, honey thick liquids, discontinue PANDA tube  Septic shock resolved   MSSA bacteremia 2/2 blood cx  - After recent angiogram but source uncertain - TEE negative - cultures now clear - cefazolin 3 weeks total per ID (stop date 03/25/2014) - repeat blood cx 9/22 remarkable for 1/2 coag negative Staph likely contaminant  ?HCAP 9/22 > doubt  quantitative culture negative from BAL   Pseudomonas R groin wound   COPD w/ acute exacerbation  Acute exacerbation has resolved - stable at this time   Chronic anemia  Hgb stable  L inguinal hematoma without evidence of bleeding   DM  - Continue sliding insulin, lantus   DVT Prophylaxis:  Code Status: Full  Family Communication:  Disposition: CIR consult placed  Consultants:  CHF team  CCM  Procedures: 9/14 Echo- EF 30-35%  9/16 TEE- no LAA thrombus, no valvular vegetation, anterior, anteroseptal, apical, and inferioapical hypokinesis, EF 35%  9/17 EKG- sinus tach, low voltage QRS, cannot r/o anteroseptal infarct  9/18 LHC- 40% distal left main, severe diffuse disease from LAD, 95% stenosis circ, focal 75% mid  RCA, LVEF 15%  9/21 L groin vascular ultrasound >>> no evidence of complication around graft site  9/29 - cardiac cath - PTCA and stenting of the left circumflex, PTCA and stenting of the proximal, mid, and distal-LAD   Antibiotics:  Ancef 9/27    Subjective: Patient seen and examined, no significant complaints, hoping for NG tube to be  out  Objective: Weight change: -4.1 kg (-9 lb 0.6 oz)  Intake/Output Summary (Last 24 hours) at 03/23/14 1212 Last data filed at 03/23/14 1000  Gross per 24 hour  Intake    860 ml  Output    950 ml  Net    -90 ml   Blood pressure 99/44, pulse 59, temperature 98.3 F (36.8 C), temperature source Oral, resp. rate 14, height 5\' 2"  (1.575 m), weight 80.2 kg (176 lb 12.9 oz), SpO2 98.00%.  Physical Exam: General: Alert and awake, oriented x3, not in any acute distress, NGT . CVS: S1-S2 clear, no murmur rubs or gallops Chest: clear to auscultation bilaterally Abdomen: soft nontender, nondistended, normal bowel sounds  Extremities: no cyanosis, clubbing, trace edema noted bilaterally Neuro: Cranial nerves II-XII intact, no focal neurological deficits  Lab Results: Basic Metabolic Panel:  Recent Labs Lab 03/22/14 0500 03/23/14 0400  NA 143 143  K 3.8 3.7  CL 103 104  CO2 29 30  GLUCOSE 132* 178*  BUN 18 17  CREATININE 0.36* 0.39*  CALCIUM 8.5 8.6  MG 2.1  --   PHOS 3.9  --    Liver Function Tests:  Recent Labs Lab 03/20/14 0500 03/22/14 0500  AST 17 22  ALT 16 13  ALKPHOS 47 54  BILITOT 0.3 0.4  PROT 5.5* 6.1  ALBUMIN 2.3* 2.4*   No results found for this basename: LIPASE, AMYLASE,  in the last 168 hours No results found for this basename: AMMONIA,  in the last 168 hours CBC:  Recent Labs Lab 03/22/14 0500 03/23/14 0400  WBC 5.7 5.4  NEUTROABS 3.4  --   HGB 10.7* 10.3*  HCT 34.4* 33.9*  MCV 77.7* 75.8*  PLT 256 266   Cardiac Enzymes: No results found for this basename: CKTOTAL, CKMB, CKMBINDEX, TROPONINI,  in the last 168 hours BNP: No components found with this basename: POCBNP,  CBG:  Recent Labs Lab 03/22/14 1731 03/22/14 2109 03/22/14 2345 03/23/14 0421 03/23/14 0819  GLUCAP 120* 165* 174* 175* 170*     Micro Results: Recent Results (from the past 240 hour(s))  CULTURE, BLOOD (ROUTINE X 2)     Status: None   Collection Time     03/14/14 11:06 AM      Result Value Ref Range Status   Specimen Description BLOOD LEFT HAND   Final   Special Requests BOTTLES DRAWN AEROBIC AND ANAEROBIC 10CC   Final   Culture  Setup Time     Final   Value: 03/14/2014 16:45     Performed at [facility]   Culture     Final   Value: STAPHYLOCOCCUS SPECIES (COAGULASE NEGATIVE)     Note: THE SIGNIFICANCE OF ISOLATING THIS ORGANISM FROM A SINGLE SET OF BLOOD CULTURES WHEN MULTIPLE SETS ARE DRAWN IS UNCERTAIN. PLEASE NOTIFY THE MICROBIOLOGY DEPARTMENT WITHIN ONE WEEK IF SPECIATION AND SENSITIVITIES ARE REQUIRED.     Note: Gram Stain Report Called to,Read Back By and Verified With: MONICA CROSS TAKEN FOR NURSE 12:55AM 03/16/14 THOMI     Performed at [facility]   Report Status 03/17/2014 FINAL   Final  CULTURE, BLOOD (ROUTINE X 2)     Status: None   Collection Time    03/14/14 11:16 AM      Result Value Ref Range Status   Specimen Description BLOOD RIGHT HAND   Final   Special Requests BOTTLES DRAWN AEROBIC AND ANAEROBIC 10CC   Final   Culture  Setup Time     Final   Value: 03/14/2014 16:43     Performed at [facility]   Culture     Final   Value: NO GROWTH 5 DAYS     Performed at [facility]   Report Status 03/20/2014 FINAL   Final  CULTURE, BAL-QUANTITATIVE     Status: None   Collection Time    03/14/14 12:47 PM      Result Value Ref Range Status   Specimen Description BRONCHIAL ALVEOLAR LAVAGE   Final   Special Requests Normal   Final   Gram Stain     Final   Value: FEW WBC PRESENT,BOTH PMN AND MONONUCLEAR     RARE SQUAMOUS EPITHELIAL CELLS PRESENT     NO ORGANISMS SEEN     Performed at [facility] Count     Final   Value: 10,000 COLONIES/ML     Performed at [facility]   Culture     Final   Value: Non-Pathogenic Oropharyngeal-type Flora Isolated.     Performed at [facility]   Report Status 03/16/2014 FINAL   Final  AFB CULTURE WITH SMEAR     Status:  None   Collection Time    03/15/14  2:33 PM      Result Value Ref Range Status   Specimen Description FLUID LEFT PLEURAL   Final   Special Requests NONE   Final   Acid Fast Smear     Final   Value: NO ACID FAST BACILLI SEEN     Performed at [facility]   Culture     Final   Value: CULTURE WILL BE EXAMINED FOR 6 WEEKS BEFORE ISSUING A FINAL REPORT     Performed at [facility]   Report Status PENDING   Incomplete  BODY FLUID CULTURE     Status: None   Collection Time    03/15/14  2:33 PM      Result Value Ref Range Status   Specimen Description FLUID LEFT PLEURAL   Final   Special Requests NONE   Final   Gram Stain     Final   Value: RARE WBC PRESENT, PREDOMINANTLY MONONUCLEAR     NO ORGANISMS SEEN     Performed at [facility]   Culture     Final   Value: NO GROWTH 3 DAYS     Performed at [facility]   Report Status 03/19/2014 FINAL   Final  FUNGUS CULTURE W SMEAR     Status: None   Collection Time    03/15/14  2:33 PM      Result Value Ref Range Status   Specimen Description PLEURAL LEFT   Final   Special Requests NONE   Final   Fungal Smear     Final   Value: NO YEAST OR FUNGAL ELEMENTS SEEN     Performed at [facility]   Culture     Final   Value: CULTURE IN PROGRESS FOR FOUR WEEKS     Performed at [facility]   Report Status PENDING  Incomplete  MRSA PCR SCREENING     Status: None   Collection Time    03/22/14  4:35 PM      Result Value Ref Range Status   MRSA by PCR NEGATIVE  NEGATIVE Final   Comment:            The GeneXpert MRSA Assay (FDA     approved for NASAL specimens     only), is one component of a     comprehensive MRSA colonization     surveillance program. It is not     intended to diagnose MRSA     infection nor to guide or     monitor treatment for     MRSA infections.    Studies/Results: Dg Tibia/fibula Left  03/05/2014   CLINICAL DATA:  Lower leg pain.  EXAM: LEFT TIBIA AND FIBULA - 2  VIEW  COMPARISON:  Screws in the distal tibia and side plate and screws in the distal fibula. The fractures have healed. 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 No acute osseous abnormality. Extensive vascular calcifications and dermal calcifications in the lower leg.  FINDINGS: No acute abnormalities. Arthritis at the knee and ankle. Postsurgical changes.  IMPRESSION: Negative.   Electronically Signed   By: Geanie Cooley M.D.   On: 03/05/2014 22:10   Ct Abdomen Pelvis W Contrast  03/06/2014   CLINICAL DATA:  Left flank pain began after recent vascular catheterization. Pain in the entire left leg. No fever, chills, nausea, vomiting.  EXAM: CT ABDOMEN AND PELVIS WITH CONTRAST  TECHNIQUE: Multidetector CT imaging of the abdomen and pelvis was performed using the standard protocol following bolus administration of intravenous contrast.  CONTRAST:  OMNIPAQUE IOHEXOL 300 MG/ML  SOLN  COMPARISON:  07/25/2013  FINDINGS: Lower chest: Dependent changes are seen in the lung bases. Scattered emphysematous changes are also present. There is atherosclerotic calcification of the coronary vessels. Heart size is normal. No pericardial effusion.  Upper abdomen: Scattered calcified granulomata are identified within the liver. No focal abnormality identified within the spleen, pancreas, or left adrenal gland. There is a low-attenuation lesion adjacent to or involving the right adrenal gland, consistent with a benign process. The patient has had previous cholecystectomy. Bilateral renal cysts are noted.  Bowel: Stomach has a normal appearance. A midline abdominal hernia contains mesenteric fat and nondilated loops of small bowel and appears stable over prior studies. Colonic loops are normal in appearance. There is moderate stool burden. The appendix has a normal appearance.  Pelvis: There is moderate stranding in the subcutaneous tissues and  abdominal wall of the left inguinal region. Findings are consistent with edema/ hematoma after recent catheterization. Age no evidence for intraperitoneal hematoma. The uterus is present. No adnexal mass. The urinary bladder is distended.  Retroperitoneum: No retroperitoneal or mesenteric adenopathy. There is dense atherosclerotic calcification of the abdominal aorta. No aneurysm. Right iliac stent.  Osseous structures: Degenerative changes in the lower thoracic and upper lumbar spine. There is 4 mm of anterolisthesis of L3 on L4.  IMPRESSION: 1. Left inguinal hematoma. This does not extend into the peritoneal cavity. 2. Midline abdominal hernia contains mesenteric fat and nondilated loops of small bowel. 3. Distended urinary bladder. 4. Normal appendix. 5. Scattered emphysematous changes. 6. Coronary artery calcifications. 7. Calcified granulomata in the liver. 8. Benign right adrenal gland lesion.   Electronically Signed   By: Sandria Senter.D.  On: 03/06/2014 01:07   [place] Extrem Low Right Ltd  03/13/2014   CLINICAL DATA:  Evaluation of bypass graft.  EXAM: ULTRASOUND right LOWER EXTREMITY LIMITED  TECHNIQUE: Ultrasound examination of the lower extremity soft tissues was performed in the area of clinical concern.  COMPARISON:  None.  FINDINGS: No focal fluid collections are identified around the graft in the right lower extremity. Patent arterial blood flow.  IMPRESSION: Negative ultrasound examination for complicating features associated with the right lower extremity bypass graft.   Electronically Signed   By: Loralie ChampagneMark  Gallerani M.D.   On: 03/13/2014 09:29   Dg Chest Port 1 View  03/17/2014   CLINICAL DATA:  Hypoxia.  EXAM: PORTABLE CHEST - 1 VIEW  COMPARISON:  Single view of the chest 03/16/2014 and 03/15/2014.  FINDINGS: Right IJ catheter and feeding tube remain in place. Right greater than left pleural effusions and basilar atelectasis persist without marked change since yesterday's examination. Heart size  is normal. No pneumothorax.  IMPRESSION: No marked change in right greater than left effusions and airspace disease since yesterday's examination.   Electronically Signed   By: Drusilla Kannerhomas  Dalessio M.D.   On: 03/17/2014 07:08   Dg Chest Port 1 View  03/16/2014   CLINICAL DATA:  Shortness of breath  EXAM: PORTABLE CHEST - 1 VIEW  COMPARISON:  03/15/2014  FINDINGS: Cardiac shadow remains mildly enlarged. Bilateral pleural effusions are noted and mildly increased from the prior exam. Increasing right basilar atelectasis is noted. A right jugular central line is seen in the SVC without evidence of pneumothorax. The endotracheal tube and nasogastric catheter been removed in the interval.  IMPRESSION: Mild increase in bilateral pleural effusions with increase in right basilar atelectasis.   Electronically Signed   By: Alcide CleverMark  Lukens M.D.   On: 03/16/2014 07:12   Dg Chest Port 1 View  03/15/2014   CLINICAL DATA:  Status post thoracentesis  EXAM: PORTABLE CHEST - 1 VIEW  COMPARISON:  Study obtained earlier in the day  FINDINGS: There is no appreciable pneumothorax. Left effusion is no longer appreciable. There is a persistent right effusion, stable.  Endotracheal tube tip is 3.8 cm above the carina. Central catheter tip is in the superior vena cava. Nasogastric tube tip and side port are below the diaphragm. Heart is upper normal in size with pulmonary vascularity within normal limits. No adenopathy.  IMPRESSION: Tube and catheter positions as described. Left effusion resolved. Right effusion persists. No pneumothorax. No change in cardiac silhouette.   Electronically Signed   By: Bretta BangWilliam  Woodruff M.D.   On: 03/15/2014 15:14   Dg Chest Port 1 View  03/15/2014   CLINICAL DATA:  Assess endotracheal tube  EXAM: PORTABLE CHEST - 1 VIEW  COMPARISON:  03/14/2014  FINDINGS: Cardiomediastinal silhouette is stable. Endotracheal tube in place with tip 1.9 cm above the carina. Stable right IJ central line position. Stable NG tube  position. Persistent mild congestion/ edema. Again noted small bilateral pleural effusion with bilateral basilar atelectasis or infiltrate.  IMPRESSION: Stable support apparatus. Persistent mild congestion/ edema. Again noted small bilateral pleural effusion with bilateral basilar atelectasis or infiltrate.   Electronically Signed   By: Natasha MeadLiviu  Pop M.D.   On: 03/15/2014 07:47   Dg Chest Port 1 View  03/14/2014   CLINICAL DATA:  Intubation.  EXAM: PORTABLE CHEST - 1 VIEW  COMPARISON:  02/22/2014.  FINDINGS: Endotracheal tube, NG tube, and right IJ line in stable position. Cardiomegaly with pulmonary alveolar infiltrates bilaterally. Bilateral pleural effusions.  These findings are consistent with congestive heart failure. Pulmonary edema has increased from prior exam. No pneumothorax. No acute osseus abnormality. Carotid vascular calcification.  IMPRESSION: 1. Line and tubes are in good anatomic position. 2. Progressive pulmonary edema with small bilateral pleural effusions. 3. Carotid vascular disease.   Electronically Signed   By: Maisie Fus  Register   On: 03/14/2014 07:35   Dg Chest Port 1 View  03/13/2014   CLINICAL DATA:  Central line insertion.  Pulmonary edema.  EXAM: PORTABLE CHEST - 1 VIEW 2:21 p.m.  COMPARISON:  03/13/2014 at 8:45 a.m. and 03/12/2014  FINDINGS: Right jugular vein catheter is been inserted and the tip is in the superior vena cava at the level of the carina in good position. Endotracheal tube the is 3 cm above the carina. NG tube has been advanced and is now in the stomach.  Heart size and pulmonary vascularity are normal. The interstitial edema pattern has improved with slight residual haziness at the lung bases. There is new increased density at both bases consistent with posteriorly layered effusions.  No acute osseous abnormality.  IMPRESSION: New central line in good position.  NG tube now below the diaphragm.  New bilateral effusions.  Improved interstitial edema.   Electronically  Signed   By: Geanie Cooley M.D.   On: 03/13/2014 14:36   Dg Chest Port 1 View  03/13/2014   CLINICAL DATA:  Intubation.  EXAM: PORTABLE CHEST - 1 VIEW  COMPARISON:  03/12/2014.  FINDINGS: Endotracheal tube noted 3 cm above the carina. NGT noted below the hemidiaphragm. Cardiomegaly with pulmonary vascular prominence and interstitial prominence consistent with congestive heart failure and interstitial edema. Bilateral pneumonitis cannot be excluded. No acute bony abnormality.  IMPRESSION: 1. Endotracheal tube noted 3 cm above the carina. NG tube in good anatomic position. 2. Congestive heart failure with pulmonary interstitial edema. Pulmonary interstitial pneumonitis cannot be excluded .   Electronically Signed   By: Maisie Fus  Register   On: 03/13/2014 09:03   Dg Chest Port 1 View  03/12/2014   CLINICAL DATA:  Follow-up pulmonary edema and effusions.  EXAM: PORTABLE CHEST - 1 VIEW  COMPARISON:  Portable chest x-rays yesterday dating back to 03/07/2014.  FINDINGS: Cardiac silhouette mildly enlarged but stable. Interval improvement in the interstitial and airspace pulmonary edema, though mild edema persists. Stable bilateral pleural effusions, right greater than left and dense consolidation in the lower lobes. No new pulmonary parenchymal abnormalities. Left jugular central venous catheter tip projects over the upper SVC, unchanged.  IMPRESSION: Support apparatus satisfactory. Improved CHF, with mild interstitial pulmonary edema persisting. Stable bilateral pleural effusions, right greater than left and associated dense passive atelectasis in the lower lobes. No new abnormalities.   Electronically Signed   By: Hulan Saas M.D.   On: 03/12/2014 08:15   Dg Chest Port 1 View  03/11/2014   CLINICAL DATA:  Pneumonitis.  EXAM: PORTABLE CHEST - 1 VIEW  COMPARISON:  03/10/2014  FINDINGS: Endotracheal and enteric tubes have been removed. Left jugular central venous catheter tip overlies the upper SVC. The cardiac  silhouette remains enlarged. Pulmonary vascular congestion persists with similar appearance of bilateral airspace opacities and likely small bilateral pleural effusions. No pneumothorax.  IMPRESSION: No significant interval change in the appearance of the lungs, most suggestive of pulmonary edema and small bilateral pleural effusions.   Electronically Signed   By: Sebastian Ache   On: 03/11/2014 13:25   Dg Chest Portable 1 View  03/10/2014   CLINICAL DATA:  Evaluate airspace disease and endotracheal tube position  EXAM: PORTABLE CHEST - 1 VIEW  COMPARISON:  Portable chest x-ray of 03/09/2014  FINDINGS: There is little change in diffuse airspace disease most consistent with edema. Cardiomegaly and probable small effusions remain. The endotracheal tube tip is approximately 4.9 cm above the carina. NG tube extends below the hemidiaphragm.  IMPRESSION: Little change in poor aeration in diffuse airspace disease most consistent with edema with probable effusions.   Electronically Signed   By: Dwyane Dee M.D.   On: 03/10/2014 07:51   Dg Chest Port 1 View  03/09/2014   CLINICAL DATA:  Respiratory distress.  EXAM: PORTABLE CHEST - 1 VIEW  COMPARISON:  03/09/2014  FINDINGS: Endotracheal tube is in place with tip approximately 3 cm above the carina. Left IJ central line tip overlies the level of the superior vena cava. Nasogastric tube is in place with tip off the film but beyond the gastroesophageal junction.  The heart is enlarged. There are central airspace filling opacity similar in appearance to the prior study.  IMPRESSION: Persistent bilateral airspace filled opacities.   Electronically Signed   By: Rosalie Gums M.D.   On: 03/09/2014 19:59   Dg Chest Port 1 View  03/09/2014   CLINICAL DATA:  Pulmonary edema.  EXAM: PORTABLE CHEST - 1 VIEW  COMPARISON:  Same day.  FINDINGS: Stable mild cardiomegaly. Endotracheal tube is in grossly good position with distal tip approximately 7 cm above the carina. Nasogastric tube  is seen entering stomach. Left internal jugular catheter line is noted with distal tip in expected position of the SVC. Bilateral perihilar and basilar interstitial densities are noted which are improved compared to prior exam. This is in consistent with improving pulmonary edema. No pneumothorax or significant pleural effusion is noted.  IMPRESSION: Mildly improved bilateral pulmonary edema.   Electronically Signed   By: Roque Lias M.D.   On: 03/09/2014 13:49   Dg Chest Port 1 View  03/09/2014   CLINICAL DATA:  Reassess known airspace disease and support tube positioning  EXAM: PORTABLE CHEST - 1 VIEW  COMPARISON:  Portable chest x-ray of 08 March 2014  FINDINGS: The lungs are adequately inflated. Widespread airspace consolidation is present but may have slightly improved since yesterday's study. The hemidiaphragms are obscured. There is no pneumothorax. The cardiac silhouette is mildly enlarged. The pulmonary vascularity is engorged.  The endotracheal tube tip lies 3 cm above the crotch of the carina. The left internal jugular venous catheter tip projects over the junction of the proximal and midportions of the SVC. The esophagogastric tube tip projects off the inferior margin of the image.  IMPRESSION: 1. Stable to slightly improved appearance of the pulmonary interstitial edema. 2. The support tubes and lines are in reasonable position.   Electronically Signed   By: David  [place]   On: 03/09/2014 07:50   Dg Chest Port 1 View  03/08/2014   CLINICAL DATA:  Evaluate endotracheal tube position  EXAM: PORTABLE CHEST - 1 VIEW  COMPARISON:  Portable chest x-ray of 03/07/2014  FINDINGS: There has been worsening of airspace disease bilaterally which is primarily perihilar in location most consistent with pulmonary edema and probable effusions. Mild cardiomegaly is stable. The tip of the endotracheal tube is approximately 2.8 cm above the carina. Left IJ central venous catheter remains unchanged in position   IMPRESSION: 1. Worsening of pulmonary edema with probable effusions. 2. Tip of endotracheal tube approximately 2.8 cm above the carina.   Electronically Signed  By: Dwyane Dee M.D.   On: 03/08/2014 08:08   Portable Chest Xray In Am  03/07/2014   CLINICAL DATA:  Hypoxia  EXAM: PORTABLE CHEST - 1 VIEW  COMPARISON:  March 06, 2014  FINDINGS: Endotracheal tube tip is 2.5 cm above the carina. Central catheter tip is at the junction of the left innominate vein and superior vena cava. Nasogastric tube tip and side port are below the diaphragm. No pneumothorax. There is interstitial edema with cardiomegaly and pulmonary venous hypertension. There is a right effusion. There is a minimal left effusion as well. There is mild airspace consolidation in the left base. No adenopathy.  IMPRESSION: Tube and catheter positions as described without pneumothorax. Evidence of a degree of congestive heart failure. Question alveolar edema versus superimposed pneumonia left base.   Electronically Signed   By: Bretta Bang M.D.   On: 03/07/2014 07:23   Dg Chest Port 1 View  03/06/2014   CLINICAL DATA:  Endotracheal tube and central line placement.  EXAM: PORTABLE CHEST - 1 VIEW  COMPARISON:  Same day.  FINDINGS: Endotracheal tube is in grossly good position with distal tip 4 cm above the carina. Left internal jugular catheter line is noted with distal tip in expected position of the SVC. No pneumothorax is noted. Stable bilateral perihilar and basilar opacities are noted concerning for edema or pneumonia.  IMPRESSION: No pneumothorax status post left internal jugular catheter line placement. Endotracheal tube in grossly good position. These results will be called to the ordering clinician or representative by the Radiologist Assistant, and communication documented in the PACS or zVision Dashboard.   Electronically Signed   By: Roque Lias M.D.   On: 03/06/2014 13:02   Dg Chest Port 1 View  03/06/2014   CLINICAL DATA:   Shortness of breath.  EXAM: PORTABLE CHEST - 1 VIEW  COMPARISON:  Chest radiograph performed 03/05/2014  FINDINGS: The lungs are well-aerated. Right basilar airspace opacity may reflect pneumonia or mild pulmonary edema. Underlying vascular congestion is noted. A small right pleural effusion is suspected. No pneumothorax is seen.  The cardiomediastinal silhouette is borderline enlarged. No acute osseous abnormalities are seen.  IMPRESSION: 1. Right basilar airspace opacity may reflect pneumonia or mild pulmonary edema. Small right pleural effusion suspected. 2. Underlying vascular congestion and borderline cardiomegaly.   Electronically Signed   By: Roanna Raider M.D.   On: 03/06/2014 06:48   Dg Chest Portable 1 View  03/05/2014   CLINICAL DATA:  Leg pain.  Smoker.  EXAM: PORTABLE CHEST - 1 VIEW  COMPARISON:  Chest radiograph 09/25/2013  FINDINGS: Cardiac leads project over the chest. Cardiomediastinal silhouette is stable. There is atherosclerotic calcification in the thoracic aortic arch. Lung volumes are low resulting in crowding of pulmonary vascular markings. No airspace disease or pulmonary edema is identified. No acute bony abnormality appreciated.  IMPRESSION: Low lung volumes.  No acute findings identified.  Atherosclerosis of the thoracic aorta.   Electronically Signed   By: Britta Mccreedy M.D.   On: 03/05/2014 19:15   Dg Abd Portable 1v  03/16/2014   CLINICAL DATA:  Feeding tube placement  EXAM: PORTABLE ABDOMEN - 1 VIEW  COMPARISON:  03/08/2014  FINDINGS: The weighted feeding tube is identified in the left upper quadrant of the abdomen. This is in the expected location of the body of stomach. Right upper quadrant surgical clips are noted. There is enteric contrast material opacifying the colon.  IMPRESSION: 1. Tip of the feeding tube is in  the expected location of the body of stomach.   Electronically Signed   By: Signa Kell M.D.   On: 03/16/2014 19:23   Dg Abd Portable 1v  03/08/2014    CLINICAL DATA:  Orogastric tube placement  EXAM: PORTABLE ABDOMEN - 1 VIEW  COMPARISON:  None.  FINDINGS: Orogastric tube tip and side port are in the stomach. There is contrast in the colon. The bowel gas pattern is unremarkable. No obstruction or free air appreciable.  IMPRESSION: Orogastric tube tip and side port are in the stomach. Bowel gas pattern unremarkable.   Electronically Signed   By: Bretta Bang M.D.   On: 03/08/2014 16:09   Dg Abd Portable 1v  03/06/2014   CLINICAL DATA:  NG tube placement.  EXAM: PORTABLE ABDOMEN - 1 VIEW  COMPARISON:  03/06/2014  FINDINGS: The endotracheal tube is 2.7 cm above the carina. The left IJ catheter tip is in the mid SVC. The NG tube tip is in the stomach. Persistent edema and right lower lobe airspace process.  IMPRESSION: The NG tube is in the stomach.  ETT is 2.7 cm above the carina.  Left IJ catheter tip is in the mid SVC.   Electronically Signed   By: Loralie Champagne M.D.   On: 03/06/2014 15:43   Dg Foot Complete Left  03/05/2014   CLINICAL DATA:  Left foot pain. Soft tissue ulceration. Redness and discoloration.  EXAM: LEFT FOOT - COMPLETE 3+ VIEW  COMPARISON:  08/10/2012  FINDINGS: There is no fracture or dislocation or bone destruction suggestive of osteomyelitis. There are 2 screws in the distal tibia and a side plate and multiple screws in the distal fibula. 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 Small plantar calcaneal spur.  IMPRESSION: No acute abnormalities.  No change since the prior exam.   Electronically Signed   By: Geanie Cooley M.D.   On: 03/05/2014 22:09   Dg Swallowing Func-speech Pathology  03/19/2014   Carolan Shiver, CCC-SLP     03/19/2014  4:52 PM Objective Swallowing Evaluation: Modified Barium Swallowing Study   Patient Details  Name: [name] MRN: [id number] Date of Birth: 06-09-1956  Today's Date: 03/19/2014 Time: 1530-1600 SLP Time Calculation (min): 30 min  Past Medical History:  Past Medical History   Diagnosis Date  . Diabetes mellitus   . Peripheral vascular disease   . Hyperlipidemia   . Obesity   . Hypertension   . Arthritis   . Ulcer of ankle     right  . Coronary artery disease   . COPD (chronic obstructive pulmonary disease)   . NSTEMI (non-ST elevated myocardial infarction)    Past Surgical History:  Past Surgical History  Procedure Laterality Date  . Cholecystectomy    . Femoral-tibial bypass graft Right 09/27/2013    Procedure: BYPASS GRAFT FEMORAL-TIBIAL ARTERY-RIGHT;  Surgeon:  Nada Libman, MD;  Location: [facility] OR;  Service: Vascular;   Laterality: Right;  . Abdominal aorta stent Right   . Bypass graft popliteal to tibial Right   . Coronary angioplasty with stent placement      proximcal RCA stent ~ 2001  . 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 FEES on 9/24 revealed severe acute reversible  dysphagia characterized by intubation related sensory deficits,  generalized weakness, and moderately severe laryngeal and  pharyngeal edema resulting in a delayed swallow initiation and  decreased laryngeal closure. Deep penetration and aspiration with  inconsistent sensation of all consistencies.  NG placed.  MBS  today to determine any potential for conservative POs.       Assessment / Plan / Recommendation Clinical Impression  Dysphagia Diagnosis: Severe pharyngeal phase dysphagia Clinical impression: Pt presents with persisting severe  pharyngeal phase dysphagia related primarily to impaired  laryngeal function during  the swallow.  Pt continued to present  with sensory deficits, delayed onset of swallow response, and  penetration of nectar and honey-thick liquids posteriorly into  larynx.  These consistencies were then aspirated and coursed down  posterior tracheal wall.  Occasionally, a cough response was  elicited, but aspiration was generally silent.  Purees were not  observed to enter larynx; however, shoulder position obfuscated  view during portion of study. Pt with marginal gains but is not  yet safe to begin a PO diet.   Recommend: 1) continue NPO with NG feeds; 2) begin therapeutic  trials of purees with SLP only;  3) therapeutic exercise to target laryngeal strengthening; 4)  repeat FEES in 3-5 days.      Treatment Recommendation  Therapy as outlined in treatment plan below    Diet Recommendation NPO;Alternative means - temporary;Ice chips  PRN after oral care   Medication Administration: Via alternative means    Other  Recommendations Oral Care Recommendations: Oral care prior  to ice chips   Follow Up Recommendations       Frequency and Duration min 3x week  2 weeks       SLP Swallow Goals     General HPI: 58 year old female with h/o COPD, DM, peripheral  vascular disease, admitted worsening left lower extremity pain  and  and abdominal pain, developed acute hypoxic respiratory  failure due to pulmonary edema requiring intubation 9/14-9/21,  extubated and reintubated 9/21-9/23, NSTEMI, MSSA bacteremia  after recent angiogram. FEES on 9/24 revealed severe acute  reversible dysphagia characterized by intubation related sensory  deficits, generalized weakness, and moderately severe laryngeal  and pharyngeal edema resulting in a delayed swallow initiation  and decreased laryngeal closure. Deep penetration and aspiration  with inconsistent sensation of all consistencies.  NG placed.   MBS today to determine any potential for conservative POs.   Type of Study: Modified Barium Swallowing Study Reason for Referral:  Objectively evaluate swallowing function Previous Swallow Assessment: see HPI Diet Prior to this Study: NPO Temperature Spikes Noted: No Respiratory Status: Nasal cannula History of Recent Intubation: Yes Length of Intubations (days): 7 days Date extubated: 03/15/14 Behavior/Cognition: Alert;Cooperative;Pleasant mood Oral Cavity - Dentition: Adequate natural dentition Oral Motor / Sensory Function: Within functional limits Self-Feeding Abilities: Able to feed self Patient Positioning: Upright in chair Baseline Vocal Quality: Hoarse;Breathy;Low vocal intensity Volitional Cough: Weak Volitional Swallow: Able to elicit Anatomy: Within functional limits (edema visualized on prior  FEES; MBS unable to identify) Pharyngeal Secretions: Not observed secondary MBS    Reason for Referral Objectively evaluate swallowing function   Oral Phase Oral Preparation/Oral Phase Oral Phase: Impaired Oral - Honey Oral - Honey Cup: Lingual pumping Oral - Nectar Oral -  Nectar Cup: Lingual pumping Oral - Solids Oral - Puree: Lingual pumping   Pharyngeal Phase Pharyngeal Phase Pharyngeal Phase: Impaired Pharyngeal - Honey Pharyngeal - Honey Teaspoon: Delayed swallow  initiation;Pharyngeal residue - valleculae;Pharyngeal residue -  pyriform sinuses;Trace aspiration;Reduced airway/laryngeal  closure;Premature spillage to pyriform  sinuses;Penetration/Aspiration during swallow Penetration/Aspiration details (honey teaspoon): Material enters  airway, passes BELOW cords without attempt by patient to eject  out (silent aspiration) Pharyngeal - Honey Cup: Delayed swallow initiation;Pharyngeal  residue - valleculae;Pharyngeal residue - pyriform sinuses;Trace  aspiration;Reduced airway/laryngeal closure;Premature spillage to  pyriform sinuses;Penetration/Aspiration during swallow Penetration/Aspiration details (honey cup): Material enters  airway, passes BELOW cords without attempt by patient to eject  out (silent aspiration) Pharyngeal - Nectar  Pharyngeal - Nectar Teaspoon: Not tested Pharyngeal - Nectar Cup: Delayed swallow initiation;Pharyngeal  residue - valleculae;Pharyngeal residue - pyriform sinuses;Trace  aspiration;Reduced airway/laryngeal closure;Premature spillage to  pyriform sinuses;Penetration/Aspiration during swallow Penetration/Aspiration details (nectar cup): Material enters  airway, passes BELOW cords without attempt by patient to eject  out (silent aspiration) Pharyngeal - Thin Pharyngeal - Ice Chips: Not tested Pharyngeal - Solids Pharyngeal - Puree: Delayed swallow initiation;Pharyngeal residue  - valleculae;Pharyngeal residue - pyriform sinuses;Premature  spillage to valleculae Penetration/Aspiration details (puree): Material enters airway,  remains ABOVE vocal cords and not ejected out  Cervical Esophageal Phase   Amanda L. Samson Frederic, [place] CCC/SLP Pager [phone]               Blenda Mounts Laurice 03/19/2014, 4:50 PM     Medications: Scheduled Meds: . aspirin  81 mg Per Tube Daily  . atorvastatin  80 mg Per Tube q1800  . bisoprolol  5 mg Oral Daily  .  ceFAZolin (ANCEF) IV  2 g Intravenous 3 times per day  . clopidogrel  75 mg Per Tube Daily  . escitalopram  10 mg Per Tube Daily  . feeding supplement (PRO-STAT SUGAR FREE 64)  30 mL Per Tube QID  . furosemide  40 mg Per Tube BID  . gabapentin  300 mg Per Tube Q8H  . heparin subcutaneous  5,000 Units Subcutaneous 3 times per day  . insulin aspart  0-20 Units Subcutaneous 6 times per day  . insulin glargine  20 Units Subcutaneous Daily  . losartan  50 mg Per Tube Daily  . nitroGLYCERIN  0.4 mg Transdermal Q24H  . predniSONE  10 mg Per Tube Q breakfast  . spironolactone  25 mg Per Tube Daily      LOS: 18 days   Camdin Hegner M.D. [facility] 03/23/2014, 12:12 PM Pager: [phone]  If 7PM-7AM, please contact night-coverage [email or web address] Password [email or web address]

## 2014-03-23 NOTE — Procedures (Signed)
Objective Swallowing Evaluation: Fiberoptic Endoscopic Evaluation of Swallowing  Patient Details  Name: [name] MRN: [id number] Date of Birth: [birth year]  Today's Date: 03/23/2014 Time: 1315-1400 SLP Time Calculation (min): 45 min  Past Medical History:  Past Medical History  Diagnosis Date  . Diabetes mellitus   . Peripheral vascular disease   . Hyperlipidemia   . Obesity   . Hypertension   . Arthritis   . Ulcer of ankle     right  . Coronary artery disease   . COPD (chronic obstructive pulmonary disease)   . NSTEMI (non-ST elevated myocardial infarction)    Past Surgical History:  Past Surgical History  Procedure Laterality Date  . Cholecystectomy    . Femoral-tibial bypass graft Right 09/27/2013    Procedure: BYPASS GRAFT FEMORAL-TIBIAL ARTERY-RIGHT;  Surgeon: Nada LibmanVance W Brabham, MD;  [facility];  Service: Vascular;  Laterality: Right;  . Abdominal aorta stent Right   . Bypass graft popliteal to tibial Right   . Coronary angioplasty with stent placement      proximcal RCA stent ~ 2001  . Amputation Right 10/14/2013    Procedure: AMPUTATION RAY;  Surgeon: Nadara MustardMarcus V Duda, MD;  [facility];  Service: Orthopedics;  Laterality: Right;  Right 2nd Ray Amputation, Theraskin Graft Right Ankle   HPI:  58 year old female with h/o COPD, DM, peripheral vascular disease, admitted worsening left lower extremity pain and  and abdominal pain, developed acute hypoxic respiratory failure due to pulmonary edema requiring intubation 9/14-9/21, extubated and reintubated 9/21-9/23, NSTEMI, MSSA bacteremia after recent angiogram. FEES on 9/24 revealed severe acute reversible dysphagia characterized by intubation related sensory deficits, generalized weakness, and moderately severe laryngeal and pharyngeal edema resulting in a delayed swallow initiation and decreased laryngeal closure. Deep penetration and aspiration with inconsistent sensation of all consistencies.  NG placed.  BSE  reordered.       Assessment / Plan / Recommendation Clinical Impression  Dysphagia Diagnosis: Moderate pharyngeal phase dysphagia Clinical impression: Pt continues to present with a moderate sensorimotor pharyngeal dysphagia, although with improvements noted since previous objective testing. Suspect that edema is resolving and pt's sensation is increasing, given consistent reflexive second swallows that were effective at reducing residuals. She does still have a delayed swallow initiation and decreased airway protextion with silent aspiration of nectar thick liquids and honey thick liquids via cup sips. SLP provided cues for an immediate cough, which was effective at clearing trace, shallow penetrates from spoonfuls of puree and honey thick liquids, and pt did not appear to fatigue as she did during initial FEES. Recommend to initiate Dys 1 textures and honey thick liquids via spoon, with immediate cough and re-swallow to decrease aspiration risk. SLP to continue to follow for tolerance and readiness for advancement.    Treatment Recommendation  Therapy as outlined in treatment plan below    Diet Recommendation Dysphagia 1 (Puree);Honey-thick liquid   Liquid Administration via: Spoon Medication Administration: Crushed with puree Supervision: Patient able to self feed;Full supervision/cueing for compensatory strategies Compensations: Slow rate;Small sips/bites;Hard cough after swallow Postural Changes and/or Swallow Maneuvers: Seated upright 90 degrees    Other  Recommendations Oral Care Recommendations: Oral care BID Other Recommendations: Order thickener from pharmacy;Prohibited food (jello, ice cream, thin soups);Remove water pitcher   Follow Up Recommendations  Inpatient Rehab    Frequency and Duration min 2x/week  2 weeks   Pertinent Vitals/Pain n/a    SLP Swallow Goals     General HPI: 58 year old female  with h/o COPD, DM, peripheral vascular disease, admitted worsening left  lower extremity pain and  and abdominal pain, developed acute hypoxic respiratory failure due to pulmonary edema requiring intubation 9/14-9/21, extubated and reintubated 9/21-9/23, NSTEMI, MSSA bacteremia after recent angiogram. FEES on 9/24 revealed severe acute reversible dysphagia characterized by intubation related sensory deficits, generalized weakness, and moderately severe laryngeal and pharyngeal edema resulting in a delayed swallow initiation and decreased laryngeal closure. Deep penetration and aspiration with inconsistent sensation of all consistencies.  NG placed.  BSE reordered.   Type of Study: Fiberoptic Endoscopic Evaluation of Swallowing Reason for Referral: Objectively evaluate swallowing function Previous Swallow Assessment: see HPI Diet Prior to this Study: NPO;Other (Comment) (NGT) Temperature Spikes Noted: No Respiratory Status: Nasal cannula History of Recent Intubation: Yes Length of Intubations (days): 7 days Date extubated: 03/15/14 Behavior/Cognition: Alert;Cooperative;Pleasant mood Oral Cavity - Dentition: Adequate natural dentition Oral Motor / Sensory Function: Within functional limits Self-Feeding Abilities: Able to feed self Patient Positioning: Upright in bed Baseline Vocal Quality: Hoarse Volitional Cough: Strong Volitional Swallow: Able to elicit Anatomy: Other (Comment) (edema appears to be improving, NGT noted) Pharyngeal Secretions: Normal    Reason for Referral Objectively evaluate swallowing function   Oral Phase Oral Preparation/Oral Phase Oral Phase: WFL   Pharyngeal Phase Pharyngeal Phase Pharyngeal Phase: Impaired Pharyngeal - Honey Pharyngeal - Honey Teaspoon: Delayed swallow initiation;Pharyngeal residue - pyriform sinuses;Reduced airway/laryngeal closure;Premature spillage to pyriform sinuses;Penetration/Aspiration during swallow;Lateral channel residue Penetration/Aspiration details (honey teaspoon): Material enters airway, remains ABOVE  vocal cords and not ejected out Pharyngeal - Honey Cup: Delayed swallow initiation;Pharyngeal residue - pyriform sinuses;Reduced airway/laryngeal closure;Premature spillage to pyriform sinuses;Penetration/Aspiration during swallow;Trace aspiration;Lateral channel residue Penetration/Aspiration details (honey cup): Material enters airway, passes BELOW cords without attempt by patient to eject out (silent aspiration) Pharyngeal - Nectar Pharyngeal - Nectar Teaspoon: Delayed swallow initiation;Pharyngeal residue - pyriform sinuses;Reduced airway/laryngeal closure;Premature spillage to pyriform sinuses;Penetration/Aspiration during swallow;Trace aspiration;Lateral channel residue Penetration/Aspiration details (nectar teaspoon): Material enters airway, passes BELOW cords without attempt by patient to eject out (silent aspiration) Pharyngeal - Nectar Cup: Delayed swallow initiation;Pharyngeal residue - pyriform sinuses;Reduced airway/laryngeal closure;Premature spillage to pyriform sinuses;Penetration/Aspiration during swallow;Trace aspiration;Lateral channel residue Penetration/Aspiration details (nectar cup): Material enters airway, passes BELOW cords without attempt by patient to eject out (silent aspiration) Pharyngeal - Thin Pharyngeal - Ice Chips: Not tested Pharyngeal - Solids Pharyngeal - Puree: Delayed swallow initiation;Pharyngeal residue - pyriform sinuses;Premature spillage to valleculae;Reduced airway/laryngeal closure;Penetration/Aspiration during swallow Penetration/Aspiration details (puree): Material enters airway, remains ABOVE vocal cords and not ejected out  Cervical Esophageal Phase    GO    Cervical Esophageal Phase Cervical Esophageal Phase: [facility]        Maxcine Ham, M.A. CCC-SLP [phone]  Maxcine Ham 03/23/2014, 2:28 PM

## 2014-03-23 NOTE — Progress Notes (Signed)
Speech Language Pathology Treatment: Dysphagia  Patient Details Name: Kaitlin RainsCheryl C Curley MRN: [id number] DOB: [birth year] Today's Date: 03/23/2014 Time: [id number] SLP Time Calculation (min): 25 min  Assessment / Plan / Recommendation Clinical Impression  Pt with improved voice today per her subjective opinion.  She does admit to some hoarseness at baseline and throat clearing in the am.    SLP administered ice chips to pt with timely swallow noted.  Delayed throat clearing and cough apparent- which may indicate laryngeal penetration and/or aspiration.  Wet voice also apparent that may indicate secretions/melted ice on vocal folds.  Patient with decreased awareness to wet voice but was able to clear it with cued throat clearing/cough.      Recommend to repeat instrumental swallow evaluation due to level of sensorimotor dysphagia on MBS 03/19/14.     HPI HPI: 58 year old female with h/o COPD, DM, peripheral vascular disease, admitted worsening left lower extremity pain and  and abdominal pain, developed acute hypoxic respiratory failure due to pulmonary edema requiring intubation 9/14-9/21, extubated and reintubated 9/21-9/23, NSTEMI, MSSA bacteremia after recent angiogram. FEES on 9/24 revealed severe acute reversible dysphagia characterized by intubation related sensory deficits, generalized weakness, and moderately severe laryngeal and pharyngeal edema resulting in a delayed swallow initiation and decreased laryngeal closure. Deep penetration and aspiration with inconsistent sensation of all consistencies.  NG placed.  BSE reordered.     Pertinent Vitals Pain Assessment: No/denies pain  SLP Plan  MBS;Other (Comment) (vs FEES)    Recommendations Diet recommendations: NPO (pt reports she has been receiving ice chips) Medication Administration: Via alternative means              Oral Care Recommendations: Oral care Q4 per protocol;Oral care prior to ice chips Plan: MBS;Other (Comment) (vs FEES)     GO     Donavan Burnetamara Jacie Tristan, MS [facility] SLP [phone]

## 2014-03-23 NOTE — Progress Notes (Signed)
NUTRITION FOLLOW UP  Intervention:    Continue TF via NGT with Jevity 1.2 at 45 ml/h and Prostat 30 ml QID to provide 1696 kcals, 120 gm protein, 875 ml free water daily.  Diet advancement as able pending results of swallow evaluation with SLP.  Nutrition Dx:   Inadequate oral intake related to inability to eat as evidenced by NPO status, ongoing.  Goal:   Intake to meet >90% of estimated nutrition needs. Unmet.  Monitor:   TF tolerance/adequacy, weight trend, labs, swallowing function and ability to begin PO diet.  Assessment:   58 yo smoker, PVD, CAD, recent (9-10) failed left femoral attempted stenosis angioplasty who presented with left leg pain 9/13 and found to be hypotensive, hypoxic and + troponin. She has a rt fem open wound that has been healng x 6 weeks and is followed by [facility]. Ct abd reveals large left femoral hematoma.  Patient intubated from 9/14-9/19 and 9/21-9/23. S/P swallow evaluation and FEES on 9/24; unable to safely take PO's at that  Time, so TF was initiated via NGT. Currently receiving Jevity 1.2 at 45 ml/h with Prostat 30 ml QID to provide 1696 kcals, 120 gm protein, 875 ml free water daily.  SLP following, recommends keep NPO until after objective swallow evaluation (MBS vs FEES).  Height: Ht Readings from Last 1 Encounters:  03/18/14 5\' 2"  (1.575 m)    Weight Status:   Wt Readings from Last 1 Encounters:  03/23/14 176 lb 12.9 oz (80.2 kg)  03/16/14  191 lb 12.8 oz (87 kg)  03/12/14  202 lb 13.2 oz (92 kg)  03/07/14  220 lb 14.4 oz (100.2 kg)   Body mass index is 32.33 kg/(m^2).   Re-estimated needs:  Kcal: 1600-1800 Protein: 100-120 gm Fluid: 1.8 L  Skin: diabetic ulcers to toes, feet, ankles; open groin wound  Diet Order:  NPO   Intake/Output Summary (Last 24 hours) at 03/23/14 1200 Last data filed at 03/23/14 1000  Gross per 24 hour  Intake    860 ml  Output    950 ml  Net    -90 ml    Last BM: 9/28   Labs:   Recent Labs Lab  03/18/14 1525 03/19/14 0630  03/21/14 0616 03/22/14 0500 03/23/14 0400  NA  --   --   < > 142 143 143  K  --   --   < > 3.6* 3.8 3.7  CL  --   --   < > 102 103 104  CO2  --   --   < > 31 29 30   BUN  --   --   < > 19 18 17   CREATININE  --   --   < > 0.36* 0.36* 0.39*  CALCIUM  --   --   < > 8.4 8.5 8.6  MG 2.1 2.0  --   --  2.1  --   PHOS 3.6 3.2  --   --  3.9  --   GLUCOSE  --   --   < > 224* 132* 178*  < > = values in this interval not displayed.  CBG (last 3)   Recent Labs  03/22/14 2345 03/23/14 0421 03/23/14 0819  GLUCAP 174* 175* 170*    Scheduled Meds: . aspirin  81 mg Per Tube Daily  . atorvastatin  80 mg Per Tube q1800  . bisoprolol  5 mg Oral Daily  .  ceFAZolin (ANCEF) IV  2 g Intravenous 3  times per day  . clopidogrel  75 mg Per Tube Daily  . escitalopram  10 mg Per Tube Daily  . feeding supplement (PRO-STAT SUGAR FREE 64)  30 mL Per Tube QID  . furosemide  40 mg Per Tube BID  . gabapentin  300 mg Per Tube Q8H  . heparin subcutaneous  5,000 Units Subcutaneous 3 times per day  . insulin aspart  0-20 Units Subcutaneous 6 times per day  . insulin glargine  20 Units Subcutaneous Daily  . losartan  50 mg Per Tube Daily  . nitroGLYCERIN  0.4 mg Transdermal Q24H  . predniSONE  10 mg Per Tube Q breakfast  . spironolactone  25 mg Per Tube Daily    Continuous Infusions: . sodium chloride 10 mL/hr at 03/23/14 1000  . feeding supplement (JEVITY 1.2 CAL) 1,000 mL (03/23/14 1000)    Joaquin CourtsKimberly Harris, RD, LDN, CNSC Pager [phone] After Hours Pager [phone]

## 2014-03-23 NOTE — Progress Notes (Signed)
Pt sat up in chair for 4 hours today, worked with PT and OT extensively.

## 2014-03-23 NOTE — Progress Notes (Signed)
Rehab admissions - I am following pt's case, noting that cardiac cath was completed yesterday and have reviewed today's therapy notes. I have spoken with [employer] [place] Rn case reviewer. [facility] that pt is not medically ready for inpatient rehab and would not tolerate the intensity of inpatient rehab at this time. RN recommended that we close out the current case and then open up a new case to seek insurance authorization when pt is showing increased activity tolerance, with sit to stand, pre-gait and gait skills demonstrated (with HR in functional parameters).  I called and shared this update with pt that we will take it one day at a time and she how she progresses with therapies. I also called Debbie, case manager to share this news.  When it is appropriate, I will seek insurance authorization [facility] [place] for [place]hab stay. I will check on pt's status tomorrow.  Please call me with any questions. Thanks.  Juliann MuleJanine Chelsye Suhre, PT Rehabilitation Admissions Coordinator [phone]

## 2014-03-23 NOTE — Progress Notes (Signed)
Occupational Therapy Treatment Patient Details Name: [name] MRN: [id number] DOB: 06-Mar-1956 Today's Date: 03/23/2014    History of present illness 59 year old female with h/o COPD, DM, peripheral vascular disease, admitted worsening left lower extremity pain and and abdominal pain, developed acute hypoxic respiratory failure due to pulmonary edema requiring intubation, NSTEMI, MSSA bacteremia after recent angiogram. Initial CXR 9/13 clear however CXR 9/14 notes right basilar airspace opacity may reflect pneumonia.  s/p cardiac catheterization 03/22/14.   OT comments  Excellent participation by pt today in standing trials and seated ADL.  Stood with Stedy x 3 and transferred to chair. HR initially up to 140, but improved to 130 with subsequent trials.  Pt able to maintain 02 sats at 93-94% on RA during UB bathing and dressing and grooming. RN to continue to monitor.  Pt remains highly motivated and an excellent inpatient rehab candidate.  Follow Up Recommendations  CIR    Equipment Recommendations       Recommendations for Other Services      Precautions / Restrictions Precautions Precautions: Fall Precaution Comments: fell 9/28 with nursing (missed EOB when sitting from stedy) Restrictions Weight Bearing Restrictions: No       Mobility Bed Mobility Overal bed mobility: Needs Assistance Bed Mobility: Rolling;Sidelying to Sit Rolling: Supervision Sidelying to sit: Min guard;HOB elevated       General bed mobility comments: used bed rails to pull (supervision/min guard for safety due to recent slide off bed)  Transfers Overall transfer level: Needs assistance Equipment used: Ambulation equipment used Transfers: Sit to/from Stand Sit to Stand: From elevated surface;Min guard;+2 safety/equipment Stand pivot transfers: +2 physical assistance;+2 safety/equipment       General transfer comment: used Steady for sit to stand and to pivot to recliner. Pt able to  independently pull up to stand using BUEs from elevated bed, with knee plate on Steady. Sit to stand x 4 trials. Pt stood for 60-90 seconds each trial. Standing tolerance limited by R knee fatigue and by increased HR of 140 on first trial, 130 on following trials. SaO2 96% on 2L O2 throughout tx.     Balance   Sitting-balance support: Single extremity supported;Feet supported Sitting balance-Leahy Scale: Fair Sitting balance - Comments: pt sat on EOB x 4 minutes      Standing balance-Leahy Scale: Poor Standing balance comment: Stood on Steady with heavy use of BUEs for support, cues for flexed posture.                    ADL Overall ADL's : Needs assistance/impaired     Grooming: Wash/dry hands;Wash/dry face;Oral care;Brushing hair;Set up;Sitting   Upper Body Bathing: Minimal assitance;Sitting (assisted with back)   Lower Body Bathing: Total assistance;Sit to/from stand Lower Body Bathing Details (indicate cue type and reason): assist for pericare Upper Body Dressing : Set up;Sitting   Lower Body Dressing: Set up;Bed level (donned socks)               Functional mobility during ADLs:  (non ambulatory, transfered with Stedy) General ADL Comments: Pt with foley removed yesterday, incontinent of urine in bed.      Vision                     Perception     Praxis      Cognition   Behavior During Therapy: [facility] for tasks assessed/performed Overall Cognitive Status: Within Functional Limits for tasks assessed  Extremity/Trunk Assessment               Exercises    Shoulder Instructions       General Comments      Pertinent Vitals/ Pain       Pain Assessment: No/denies pain  Home Living                                          Prior Functioning/Environment              Frequency Min 2X/week     Progress Toward Goals  OT Goals(current goals can now be found in the care plan  section)  Progress towards OT goals: Progressing toward goals  Acute Rehab OT Goals Patient Stated Goal: to get to the bathroom, to walk  Plan Discharge plan remains appropriate    Co-evaluation    PT/OT/SLP Co-Evaluation/Treatment: Yes Reason for Co-Treatment: For patient/therapist safety PT goals addressed during session: Mobility/safety with mobility;Balance;Strengthening/ROM        End of Session     Activity Tolerance Patient limited by fatigue;Treatment limited secondary to medical complications (Comment) (HR 140 with first standing trial, improved to 130 with 2 oth)   Patient Left in chair;with call bell/phone within reach;with nursing/sitter in room   Nurse Communication  (ok to leave 02 off, pt sats 93-94%, RN will observe)        Time: [id number] OT Time Calculation (min): 47 min  Charges: OT General Charges $OT Visit: 1 Procedure OT Treatments $Self Care/Home Management : 8-22 mins $Therapeutic Activity: 8-22 mins  Evern BioMayberry, Cooper Moroney Lynn 03/23/2014, 9:46 AM [phone]

## 2014-03-23 NOTE — Progress Notes (Signed)
PROGRESS NOTE  Subjective:    Underwent PCI of LAD x 3 DES and LCX x 1 DES on 9/30. Doing well this am. Still with NG tube   Objective:    Vital Signs:   Temp:  [97.4 F (36.3 C)-98.2 F (36.8 C)] 97.7 F (36.5 C) (10/01 0447) Pulse Rate:  [57-96] 57 (10/01 0148) Resp:  [14-22] 15 (10/01 0447) BP: (53-141)/(19-70) 124/52 mmHg (10/01 0447) SpO2:  [90 %-99 %] 95 % (10/01 0447) Weight:  [80.2 kg (176 lb 12.9 oz)] 80.2 kg (176 lb 12.9 oz) (10/01 0402)  Last BM Date: 03/20/14   24-hour weight change: Weight change: -4.1 kg (-9 lb 0.6 oz)  Weight trends: Filed Weights   03/21/14 0447 03/22/14 0500 03/23/14 0402  Weight: 89.5 kg (197 lb 5 oz) 84.3 kg (185 lb 13.6 oz) 80.2 kg (176 lb 12.9 oz)    Intake/Output:  09/30 0701 - 10/01 0700 In: 598 [I.V.:83; NG/GT:415; IV Piggyback:100] Out: 2050 [Urine:2050]     Physical Exam: BP 124/52  Pulse 57  Temp(Src) 97.7 F (36.5 C) (Oral)  Resp 15  Ht 5\' 2"  (1.575 m)  Wt 80.2 kg (176 lb 12.9 oz)  BMI 32.33 kg/m2  SpO2 95%  Wt Readings from Last 3 Encounters:  03/23/14 80.2 kg (176 lb 12.9 oz)  03/23/14 80.2 kg (176 lb 12.9 oz)  03/23/14 80.2 kg (176 lb 12.9 oz)    General: Vital signs reviewed and noted. Extubated. Chronically ill appearing  Head: Normocephalic, atraumatic.  Eyes: conjunctivae/corneas clear.  EOM's intact.   Throat: normal  Neck:  IJ in place   Lungs:  Decreased BS throughout no wheezing  Heart:  RRR, no s3 2/6 TR  Abdomen:  Soft, non-tender, non-distended    Extremities:  Warm no edema.   Neurologic: Moves all 4  Psych: A&Ox3    Labs: BMET:  Recent Labs  03/22/14 0500 03/23/14 0400  NA 143 143  K 3.8 3.7  CL 103 104  CO2 29 30  GLUCOSE 132* 178*  BUN 18 17  CREATININE 0.36* 0.39*  CALCIUM 8.5 8.6  MG 2.1  --   PHOS 3.9  --     Liver function tests:  Recent Labs  03/22/14 0500  AST 22  ALT 13  ALKPHOS 54  BILITOT 0.4  PROT 6.1  ALBUMIN 2.4*   No results found for  this basename: LIPASE, AMYLASE,  in the last 72 hours  CBC:  Recent Labs  03/22/14 0500 03/23/14 0400  WBC 5.7 5.4  NEUTROABS 3.4  --   HGB 10.7* 10.3*  HCT 34.4* 33.9*  MCV 77.7* 75.8*  PLT 256 266    Cardiac Enzymes: No results found for this basename: CKTOTAL, CKMB, TROPONINI,  in the last 72 hours  Coagulation Studies:  Recent Labs  03/22/14 0500  LABPROT 14.0  INR 1.08    Other: No components found with this basename: POCBNP,  No results found for this basename: DDIMER,  in the last 72 hours  Recent Labs  03/22/14 0500  HGBA1C 7.2*    Recent Labs  03/22/14 0500  CHOL 135  HDL 31*  LDLCALC 69  TRIG 161176*  CHOLHDL 4.4   No results found for this basename: TSH, T4TOTAL, FREET3, T3FREE, THYROIDAB,  in the last 72 hours No results found for this basename: VITAMINB12, FOLATE, FERRITIN, TIBC, IRON, RETICCTPCT,  in the last 72 hours   Other results:  EKG :  NSR at 93.  Old ant. MI  Medications:    Infusions: . sodium chloride 10 mL/hr at 03/20/14 2124  . feeding supplement (JEVITY 1.2 CAL) 1,000 mL (03/22/14 1844)    Scheduled Medications: . aspirin  81 mg Per Tube Daily  . atorvastatin  40 mg Per Tube q1800  . carvedilol  6.25 mg Per Tube BID WC  .  ceFAZolin (ANCEF) IV  2 g Intravenous 3 times per day  . clopidogrel  75 mg Per Tube Daily  . digoxin  0.125 mg Per Tube Daily  . escitalopram  10 mg Per Tube Daily  . feeding supplement (PRO-STAT SUGAR FREE 64)  30 mL Per Tube QID  . furosemide  40 mg Per Tube BID  . gabapentin  300 mg Per Tube Q8H  . heparin subcutaneous  5,000 Units Subcutaneous 3 times per day  . insulin aspart  0-20 Units Subcutaneous 6 times per day  . insulin glargine  20 Units Subcutaneous Daily  . losartan  50 mg Per Tube Daily  . nitroGLYCERIN  0.4 mg Transdermal Q24H  . predniSONE  10 mg Per Tube Q breakfast  . spironolactone  25 mg Per Tube Daily    Assessment/ Plan:    1. NSTEMI with severe three-vessel coronary  artery disease    --PCI of LAD x 3 DES and LCX x 1 DES on 9/30.  2. Acute on chronic systolic congestive heart failure with an EF of 10-15% -> 30-35% 3. Acute respiratory failure 4. Fever/leukocytosis    --MSSA bacteremia 9/15 5. Diabetes mellitus 6. COPD 7. Peripheral vascular disease -status post right iliac stent, status post fem - tibioperoneal trunk. L foot ulcerations 8. Hypernatremia 9. Iron-def anemia  She is s/p PCI of RCA and LAD yesterday. Doing well. P2Y12 therapeutic at 152.  Doing well post PCI. HF under control. CVP 5. Mild rhonchi on exam. Will switch carvedilol to bisoprolol 5. Increase statin. Can stop digoxin. Will need life-long DAPT. Will place PT consult. CIR consult pending. Discussed risk of stent thrombosis and need to avoid smoking completely in future.    Everet Flagg,MD 7:23 AM

## 2014-03-23 NOTE — Progress Notes (Signed)
Physical Therapy Treatment Patient Details Name: [name] MRN: [id number] DOB: Jul 20, 1955 Today's Date: 03/23/2014    History of Present Illness 58 year old female with h/o COPD, DM, peripheral vascular disease, admitted worsening left lower extremity pain and and abdominal pain, developed acute hypoxic respiratory failure due to pulmonary edema requiring intubation, NSTEMI, MSSA bacteremia after recent angiogram. Initial CXR 9/13 clear however CXR 9/14 notes right basilar airspace opacity may reflect pneumonia.  s/p cardiac catheterization 03/22/14.    PT Comments    *Pt progressing well with activity tolerance. She performed sit to stand x 4 today using Steady to pull up. She stood 60-90 seconds each trial, HR up to 140 1st trial, 130 2nd-4th trials. Standing tolerance limited by RLE fatigue and by increased HR. Performed resisted LE exercises. Pt puts forth good effort and would benefit from CIR.  **  Follow Up Recommendations  CIR;Supervision/Assistance - 24 hour     Equipment Recommendations   (TBA)    Recommendations for Other Services Rehab consult     Precautions / Restrictions Precautions Precautions: Fall Precaution Comments: fell 9/28 with nursing (missed EOB when sitting from stedy) Restrictions Weight Bearing Restrictions: No    Mobility  Bed Mobility Overal bed mobility: Needs Assistance Bed Mobility: Rolling;Sidelying to Sit Rolling: Supervision Sidelying to sit: Min guard;HOB elevated       General bed mobility comments: used bed rails to pull (supervision/min guard for safety due to recent slide off bed)  Transfers Overall transfer level: Needs assistance Equipment used: Ambulation equipment used Transfers: Sit to/from Stand Sit to Stand: From elevated surface;Min guard;+2 safety/equipment Stand pivot transfers: +2 physical assistance;+2 safety/equipment       General transfer comment: used Steady for sit to stand and to pivot to recliner. Pt  able to independently pull up to stand using BUEs from elevated bed, with knee plate on Steady. Sit to stand x 4 trials. Pt stood for 60-90 seconds each trial. Standing tolerance limited by R knee fatigue and by increased HR of 140 on first trial, 130 on following trials. SaO2 96% on 2L O2 throughout tx.   Ambulation/Gait                 Stairs            Wheelchair Mobility    Modified Rankin (Stroke Patients Only)       Balance   Sitting-balance support: Single extremity supported;Feet supported Sitting balance-Leahy Scale: Fair Sitting balance - Comments: pt sat on EOB x 4 minutes      Standing balance-Leahy Scale: Poor Standing balance comment: Stood on Steady with heavy use of BUEs for support, cues for flexed posture.                     Cognition Arousal/Alertness: Awake/alert Behavior During Therapy: WFL for tasks assessed/performed Overall Cognitive Status: Within Functional Limits for tasks assessed                      Exercises General Exercises - Lower Extremity Ankle Circles/Pumps: AROM;Both;10 reps;Seated Gluteal Sets: AROM;Both;5 reps;Seated Long Arc Quad: AROM;Both;10 reps;Seated (with manual resistance for knee flexion and extension) Hip ABduction/ADduction: AROM;Both;10 reps;Seated (with manual resistance for ABD/ADDuction) Hip Flexion/Marching: AROM;Both;10 reps;Seated    General Comments        Pertinent Vitals/Pain Pain Assessment: No/denies pain HR 130-140 in standing, 111 after 2 minutes rest SaO2 94-99% on 2L O2 with activity    Home Living  Prior Function            PT Goals (current goals can now be found in the care plan section) Acute Rehab PT Goals Patient Stated Goal: to get to the bathroom, to walk PT Goal Formulation: With patient Time For Goal Achievement: 03/31/14 Potential to Achieve Goals: Good Progress towards PT goals: Progressing toward goals    Frequency   Min 3X/week    PT Plan Current plan remains appropriate    Co-evaluation PT/OT/SLP Co-Evaluation/Treatment: Yes Reason for Co-Treatment: Complexity of the patient's impairments (multi-system involvement);For patient/therapist safety PT goals addressed during session: Mobility/safety with mobility;Balance;Strengthening/ROM       End of Session Equipment Utilized During Treatment: Gait belt;Oxygen (stedy) Activity Tolerance: Patient limited by fatigue;Treatment limited secondary to medical complications (Comment) (increased HR) Patient left: in chair;with call bell/phone within reach;with nursing/sitter in room     Time: 6213-08650845-0918 PT Time Calculation (min): 33 min  Charges:  $Therapeutic Exercise: 8-22 mins $Therapeutic Activity: 8-22 mins                    G Codes:      Tamala SerUhlenberg, Shadrick Senne Kistler 03/23/2014, 9:33 AM [phone]

## 2014-03-24 DIAGNOSIS — M79605 Pain in left leg: Secondary | ICD-10-CM

## 2014-03-24 DIAGNOSIS — I5021 Acute systolic (congestive) heart failure: Secondary | ICD-10-CM

## 2014-03-24 LAB — GLUCOSE, CAPILLARY
GLUCOSE-CAPILLARY: 89 mg/dL (ref 70–99)
Glucose-Capillary: 106 mg/dL — ABNORMAL HIGH (ref 70–99)
Glucose-Capillary: 155 mg/dL — ABNORMAL HIGH (ref 70–99)
Glucose-Capillary: 226 mg/dL — ABNORMAL HIGH (ref 70–99)
Glucose-Capillary: 230 mg/dL — ABNORMAL HIGH (ref 70–99)
Glucose-Capillary: 83 mg/dL (ref 70–99)

## 2014-03-24 LAB — BASIC METABOLIC PANEL
ANION GAP: 10 (ref 5–15)
BUN: 19 mg/dL (ref 6–23)
CALCIUM: 8.5 mg/dL (ref 8.4–10.5)
CO2: 28 meq/L (ref 19–32)
CREATININE: 0.43 mg/dL — AB (ref 0.50–1.10)
Chloride: 103 mEq/L (ref 96–112)
Glucose, Bld: 100 mg/dL — ABNORMAL HIGH (ref 70–99)
Potassium: 3.4 mEq/L — ABNORMAL LOW (ref 3.7–5.3)
Sodium: 141 mEq/L (ref 137–147)

## 2014-03-24 MED ORDER — SODIUM CHLORIDE 0.9 % IV BOLUS (SEPSIS)
500.0000 mL | Freq: Once | INTRAVENOUS | Status: AC
  Administered 2014-03-24: 500 mL via INTRAVENOUS

## 2014-03-24 MED ORDER — INSULIN ASPART 100 UNIT/ML ~~LOC~~ SOLN
0.0000 [IU] | Freq: Three times a day (TID) | SUBCUTANEOUS | Status: DC
  Administered 2014-03-24 – 2014-03-25 (×2): 7 [IU] via SUBCUTANEOUS
  Administered 2014-03-25: 3 [IU] via SUBCUTANEOUS
  Administered 2014-03-25: 4 [IU] via SUBCUTANEOUS
  Administered 2014-03-26: 3 [IU] via SUBCUTANEOUS
  Administered 2014-03-26: 4 [IU] via SUBCUTANEOUS
  Administered 2014-03-26: 11 [IU] via SUBCUTANEOUS
  Administered 2014-03-27: 7 [IU] via SUBCUTANEOUS
  Administered 2014-03-27: 4 [IU] via SUBCUTANEOUS

## 2014-03-24 MED ORDER — ONDANSETRON HCL 4 MG PO TABS: 4.0000 mg | ORAL_TABLET | Freq: Four times a day (QID) | ORAL | Status: DC | PRN

## 2014-03-24 MED ORDER — ACETAMINOPHEN 325 MG PO TABS: 650.0000 mg | ORAL_TABLET | ORAL | Status: DC | PRN

## 2014-03-24 MED ORDER — POTASSIUM CHLORIDE CRYS ER 20 MEQ PO TBCR
60.0000 meq | EXTENDED_RELEASE_TABLET | Freq: Once | ORAL | Status: AC
  Administered 2014-03-24: 60 meq via ORAL
  Filled 2014-03-24: qty 3

## 2014-03-24 MED ORDER — OXYCODONE HCL 5 MG PO TABS
5.0000 mg | ORAL_TABLET | ORAL | Status: DC | PRN
  Administered 2014-03-24: 5 mg via ORAL
  Administered 2014-03-25: 10 mg via ORAL
  Filled 2014-03-24: qty 1
  Filled 2014-03-24: qty 2

## 2014-03-24 MED ORDER — FUROSEMIDE 40 MG PO TABS
40.0000 mg | ORAL_TABLET | Freq: Every day | ORAL | Status: DC
  Administered 2014-03-27: 40 mg via ORAL
  Filled 2014-03-24 (×2): qty 1

## 2014-03-24 NOTE — Progress Notes (Signed)
Rehab admissions - I am following pt's case and met with pt, her sister and her son to share update. See my note from yesterday explaining that insurance feels pt is not medically ready for inpatient rehab due to decreased activity tolerance/cardiac issues following her cardiac cath.   At this time, pt has not yet been seen by therapy and I have no new information to present to insurance company. We will need to demonstrate that pt has the activity tolerance for our program, that her heart rate stays [facility] during activity and that she can begin to perform mobility outside of the Steady lift (to show mobility progress from last few days). I spoke with Mickel Baas, PT about this and PT will see pt later today. I also requested that pt to be seen over the weekend if possible. I will also need documentation from OT and SLP for insurance authorization.  I will re-evaluate pt on Monday am to see how she progressed with therapies. If appropriate at that time, I will reopen the case with her [employer] [place] to seek insurance authorization for inpatient rehab.  Pt/family are aware of this plan and hope that it works out to come to inpatient rehab.  Please call me with any questions. Thanks.  Nanetta Batty, PT Rehabilitation Admissions Coordinator [phone]

## 2014-03-24 NOTE — Progress Notes (Signed)
Physical Therapy Treatment Patient Details Name: [name] MRN: [id number] DOB: [birth year] Today's Date: 03/24/2014    History of Present Illness 58 year old female with h/o COPD, DM, peripheral vascular disease, admitted worsening left lower extremity pain and and abdominal pain, developed acute hypoxic respiratory failure due to pulmonary edema requiring intubation, NSTEMI, MSSA bacteremia after recent angiogram. Initial CXR 9/13 clear however CXR 9/14 notes right basilar airspace opacity may reflect pneumonia.  s/p cardiac catheterization 03/22/14.    PT Comments    Pt progressing towards physical therapy goals. Pt demonstrated a good rehab effort today as she was up in the chair for most of the morning, had an hour break back in the bed, and was then willing to participate in therapy and sit up in the chair again at end of session. Pt improving in functional mobility, and was able to take side steps at the edge of the bed, as well as perform pre-gait activity of sit<>stand x4, marching in place, and 1 step forward and backwards. Pt fatigued after this as we did not use lift equipment (RW and +2 assist), and further gait training is deferred until next session. Will continue to follow and progress as able per POC.   Follow Up Recommendations  CIR;Supervision/Assistance - 24 hour     Equipment Recommendations  Other (comment) (TBD)    Recommendations for Other Services Rehab consult     Precautions / Restrictions Precautions Precautions: Fall Precaution Comments: fell 9/28 with nursing (missed EOB when sitting from stedy) Restrictions Weight Bearing Restrictions: No    Mobility  Bed Mobility Overal bed mobility: Needs Assistance Bed Mobility: Supine to Sit     Supine to sit: Supervision     General bed mobility comments: Pt able to transition to EOB without assistance. Use of bed rails for support and increased time required.   Transfers Overall transfer level: Needs  assistance Equipment used: Rolling walker (2 wheeled) Transfers: Sit to/from Stand Sit to Stand: From elevated surface;Mod assist;+2 physical assistance Stand pivot transfers: Max assist;+2 physical assistance       General transfer comment: Pt was able to perform sit<>stand x4 throughout session. As pt fatigued, more assist was required for pt to achieve full standing.   Ambulation/Gait Ambulation/Gait assistance: Mod assist;+2 physical assistance;+2 safety/equipment Ambulation Distance (Feet): 5 Feet Assistive device: Rolling walker (2 wheeled) Gait Pattern/deviations: Step-to pattern;Decreased stride length;Trunk flexed Gait velocity: Decreased Gait velocity interpretation: Below normal speed for age/gender General Gait Details: Pt was able to take side steps at EOB x5 feet to L and 5 feet to R with +2 assist for stability. Pt then able to take 1 step forward and backward at bedside.    Stairs            Wheelchair Mobility    Modified Rankin (Stroke Patients Only)       Balance Overall balance assessment: History of Falls                                  Cognition Arousal/Alertness: Awake/alert Behavior During Therapy: WFL for tasks assessed/performed Overall Cognitive Status: Within Functional Limits for tasks assessed                      Exercises General Exercises - Lower Extremity Long Arc Quad: 10 reps    General Comments General comments (skin integrity, edema, etc.): Egress test performed - pt very  fatigued at end of testing as she took some side steps at EOB as well, however did not feel comfortable beginning gait training when pt was so fatigued.       Pertinent Vitals/Pain Pain Assessment: No/denies pain    Home Living                      Prior Function            PT Goals (current goals can now be found in the care plan section) Acute Rehab PT Goals Patient Stated Goal: to get to the bathroom, to walk PT  Goal Formulation: With patient Time For Goal Achievement: 03/31/14 Potential to Achieve Goals: Good Progress towards PT goals: Progressing toward goals    Frequency  Min 3X/week    PT Plan Current plan remains appropriate    Co-evaluation             End of Session Equipment Utilized During Treatment: Gait belt Activity Tolerance: Patient limited by fatigue Patient left: in chair;with call bell/phone within reach     Time: 1500-1525 PT Time Calculation (min): 25 min  Charges:  $Gait Training: 8-22 mins $Therapeutic Activity: 8-22 mins                    G Codes:      [name], [name] 03/24/2014, 4:57 PM  [name], PT, DPT Acute Rehabilitation Services Pager: [phone]

## 2014-03-24 NOTE — PMR Pre-admission (Signed)
PMR Admission Coordinator Pre-Admission Assessment  Patient: [name] is an 58 y.o., female MRN: [id number] DOB: [birth year] Height: 5\' 2"  (157.5 cm) Weight: 79.8 kg (175 lb 14.8 oz)              Insurance Information HMO:     PPO: yes     PCP:      IPA:      80/20:      OTHER:  PRIMARY: [employer] [place]      Policy#: [id number]      Subscriber: self CM Name: Mahlon Gammon, RN      Phone#: [phone]     Fax#: [phone] Approval given on 03-27-14 from Kathlee Nations, [place] and Webb Silversmith, case manager will call when she wants updates (typically in one week, tentatively plan on updates due on 29-92-42) Pre-Cert#: [id number]      Employer: [employer]  Benefits:  Phone #: [phone]     Name: Erskine Speed. Date: 06-23-13     Deduct: $600 (met all)      Out of Pocket Max: $2400 (met all)      Life Max: unlimited CIR: 80/20%, pre-auth needed      SNF: 80/20%, pre-auth needed. 120 days visit max. Outpatient: 80%     Co-Pay: 20%, $30 copay/visit (PT/OT only) Home Health: 80%      Co-Pay: 20%, visit limit based on medical necessity DME: 80%     Co-Pay: 20% Providers: in network  Emergency Contact Information Contact Information   Name Relation Home Work Mobile   [place] Sister [phone]     Araceli, Arango [phone]       Current Medical History  Patient Admitting Diagnosis: Deconditioning due to sepsis  History of Present Illness: [name] is a 58 y.o. 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 She was admitted on 03/05/14 with hypotension, tachycardia,  fever of 101.9, leucocytosis and significant Left flank swelling. She was started on IV antibiotics for sepsis and was found to have left inguinal hematoma on CT. Patient was noted to have positive troponin's due to NSTEMI and started on IV heparin with serial CBC for monitoring.   Right heel culture positive for pseudomonas and BC X 2 positive for MSSA. TEE negative for thrombus or vegetations. Hospital course complicated by respiratory failure requiring intubation on 09/14 to 9/19 and reintubation 09/21 to 9/23. She underwent cardiac cath on 09/18 by Dr. Irish Lack revealing severe 3 vessel CAD with EF 10-15%. She was not felt to be a surgical candidate per Dr. Prescott Gum. CAD treated medically and acute on chronic systolic CHF treated with diuretics with clinical improvement. Left transudate effusion tapped for 500 cc clear fluid.  Speech therapy following for treatment of dysphagia with s/s of aspiration. MBS done on 09/27 revealing severe pharyngeal dysphagia and NPO recommended due to silent aspiration. PT evaluation done on 09/25 and patient noted to be severely deconditioned. MD and therapy team recommending CIR for progressive therapy.    Past Medical History  Past Medical History  Diagnosis Date  . Diabetes mellitus   . Peripheral vascular disease   . Hyperlipidemia   . Obesity   . Hypertension   . Arthritis   . Ulcer of ankle     right  . Coronary artery disease   . COPD (chronic obstructive pulmonary disease)   .  NSTEMI (non-ST elevated myocardial infarction)     Family History  family history includes Cancer in her mother; Heart attack in her brother, father, and sister; Heart disease in her brother, father, and sister; Hyperlipidemia in her brother, father, and sister; Hypertension in her brother, father, and sister; Other in her brother, father, and sister.  Prior Rehab/Hospitalizations: previous home health in April 2015 after infection/hospitalization   Current Medications  Current facility-administered medications:0.9 %  sodium chloride infusion, , Intravenous, Continuous, Elsie Stain, MD, Last Rate: 10 mL/hr at 03/24/14 0201;  acetaminophen (TYLENOL) tablet 650 mg, 650 mg, Oral, Q4H PRN, Cherene Altes, MD;  albuterol (PROVENTIL) (2.5 MG/3ML)  0.083% nebulizer solution 2.5 mg, 2.5 mg, Nebulization, Q3H PRN, Raylene Miyamoto, MD, 2.5 mg at 03/09/14 1015 aspirin chewable tablet 81 mg, 81 mg, Oral, Daily, Ripudeep K Rai, MD, 81 mg at 03/27/14 1013;  atorvastatin (LIPITOR) tablet 80 mg, 80 mg, Oral, q1800, Ripudeep K Rai, MD, 80 mg at 03/26/14 1414;  bisoprolol (ZEBETA) tablet 5 mg, 5 mg, Oral, Daily, Jolaine Artist, MD, 5 mg at 03/27/14 1013;  clopidogrel (PLAVIX) tablet 75 mg, 75 mg, Oral, Daily, Ripudeep K Rai, MD, 75 mg at 03/27/14 1013 escitalopram (LEXAPRO) tablet 10 mg, 10 mg, Oral, Daily, Ripudeep K Rai, MD, 10 mg at 03/27/14 1013;  feeding supplement (ENSURE COMPLETE) (ENSURE COMPLETE) liquid 237 mL, 237 mL, Oral, BID BM, Dorann Ou, RD;  fentaNYL (SUBLIMAZE) injection 12.5-25 mcg, 12.5-25 mcg, Intravenous, Q2H PRN, Wilhelmina Mcardle, MD, 25 mcg at 03/20/14 2119;  furosemide (LASIX) tablet 40 mg, 40 mg, Oral, Daily, Cherene Altes, MD, 40 mg at 03/27/14 1013 gabapentin (NEURONTIN) capsule 300 mg, 300 mg, Oral, TID, Ripudeep K Rai, MD, 300 mg at 03/27/14 1013;  heparin injection 5,000 Units, 5,000 Units, Subcutaneous, 3 times per day, Jolaine Artist, MD, 5,000 Units at 03/27/14 0512;  insulin aspart (novoLOG) injection 0-20 Units, 0-20 Units, Subcutaneous, TID WC, Cherene Altes, MD, 7 Units at 03/27/14 1137 insulin glargine (LANTUS) injection 20 Units, 20 Units, Subcutaneous, Daily, Cherene Altes, MD, 20 Units at 03/27/14 1017;  losartan (COZAAR) tablet 25 mg, 25 mg, Oral, QHS, Luke K Kilroy, PA-C;  nitroGLYCERIN (NITRODUR - Dosed in mg/24 hr) patch 0.4 mg, 0.4 mg, Transdermal, Q24H, Cherene Altes, MD, 0.4 mg at 03/26/14 1632;  ondansetron (ZOFRAN) injection 4 mg, 4 mg, Intravenous, Q6H PRN, Jettie Booze, MD ondansetron [facility]) tablet 4 mg, 4 mg, Oral, Q6H PRN, Cherene Altes, MD;  oxyCODONE (Oxy IR/ROXICODONE) immediate release tablet 5-10 mg, 5-10 mg, Oral, Q4H PRN, Cherene Altes, MD, 10 mg at  03/25/14 1801;  predniSONE (DELTASONE) tablet 10 mg, 10 mg, Oral, Q breakfast, Ripudeep K Rai, MD, 10 mg at 03/27/14 0750;  McClure, , Oral, PRN, Charlynne Cousins, MD sodium chloride 0.9 % injection 10-40 mL, 10-40 mL, Intracatheter, Q12H, Ripudeep K Rai, MD, 10 mL at 03/24/14 0907;  sodium chloride 0.9 % injection 10-40 mL, 10-40 mL, Intracatheter, PRN, Ripudeep K Rai, MD, 10 mL at 03/27/14 0452;  spironolactone (ALDACTONE) tablet 12.5 mg, 12.5 mg, Oral, Daily, Luke K Kilroy, PA-C, 12.5 mg at 03/27/14 1013  Patients Current Diet: mechanical soft, honey thick, full supervision, meds crushed in puree, liquid by spoon only, cough after each bite/sip.  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."  Home Assistive Devices / Equipment Home Assistive Devices/Equipment: CBG Meter;Eyeglasses Home Equipment: Dan Humphreys - 2 wheels;Shower seat;Cane - single point;Adaptive equipment;Bedside commode  Prior Functional Level Prior Function Level of Independence: Independent with assistive device(s)  Current Functional Level Cognition  Overall Cognitive Status: Within Functional Limits for tasks assessed Orientation Level: Oriented X4    Extremity Assessment (includes Sensation/Coordination)          ADLs  Overall ADL's : Needs assistance/impaired Eating/Feeding: Supervision/ safety;Bed level (ST doing trial feeding with applesauce) Grooming: Wash/dry hands;Wash/dry face;Oral care;Brushing hair;Set up;Sitting Upper Body Bathing: Minimal assitance;Sitting (assisted with back) Lower Body Bathing: Total assistance;Sit to/from stand Lower Body Bathing Details (indicate cue type and  reason): assist for pericare Upper Body Dressing : Set up;Sitting Lower Body Dressing: Set up;Bed level (donned socks) Lower Body Dressing Details (indicate cue type and reason): pt can cross her foot over her knee in supine to donn and doff her socks Functional mobility during ADLs:  (non ambulatory, transfered with Stedy) General ADL Comments: Pt with foley removed yesterday, incontinent of urine in bed.    Mobility  Overal bed mobility: Needs Assistance Bed Mobility: Supine to Sit Rolling: Supervision Sidelying to sit: Min guard;HOB elevated Supine to sit: Supervision Sit to sidelying: Min guard General bed mobility comments: with rail and HOB elevated pt able to get self seated EOB with incr time, no physical assist needed, cues on ease of technique.    Transfers  Overall transfer level: Needs assistance Equipment used: Rolling walker (2 wheeled) Transfers: Sit to/from Stand Sit to Stand: Min assist Stand pivot transfers: Max assist;+2 physical assistance General transfer comment: sit/stand x3 reps total: x1 from lowest bed position, x2 from recliner chair. all with min assist to power up and cues on technique/ant weight shift to assist with standing.    Ambulation / Gait / Stairs / Wheelchair Mobility  Ambulation/Gait Ambulation/Gait assistance: Min assist;+2 safety/equipment Ambulation Distance (Feet): 12 Feet Assistive device: Rolling walker (2 wheeled) Gait Pattern/deviations: Step-through pattern;Decreased stride length;Trunk flexed;Wide base of support Gait velocity: Decreased Gait velocity interpretation: Below normal speed for age/gender General Gait Details: cues on posture, walker position and to incr right foot clearance with gait. all gait was forward (going toward door) with chair follow for safety.    Posture / Balance Dynamic Sitting Balance Sitting balance - Comments: pt sat on EOB x 4 minutes     Special needs/care consideration BiPAP/CPAP no  CPM  no Continuous Drip IV no  Dialysis no          Life Vest no  Oxygen no Special Bed no  Trach Size no  Wound Vac (area) no       Skin - no issues, recent L IJ line taken out this am 10-5                               Bowel mgmt: last BM on 03-26-14, incontinent Bladder mgmt: using bedpan or BSC with assist Diabetic mgmt - managed at home with medications   Previous Home Environment Living Arrangements: Alone (son lives right next door)  Lives With: Alone Available Help at Discharge: Family;Available 24 hours/day (chart stated pt lives in converted garage, son lives in house) Type of Home: House Home Layout: One level Home Access: Level entry Bathroom Shower/Tub: Heritage manager Toilet: Handicapped  height Home Care Services: No  Discharge Living Setting Plans for Discharge Living Setting: Other (Comment) (plan is to stay at son's house (lives next door to her house) Type of Home at Discharge: House Discharge Home Layout: One level Discharge Home Access: Stairs to enter Entrance Stairs-Rails: Right Entrance Stairs-Number of Steps: 2 Does the patient have any problems obtaining your medications?: No  Social/Family/Support Systems Patient Roles: Other (Comment) (pt was working full time, selling hearing aides @ [employer]) Contact Information: sister is primary contact Anticipated Caregiver: son is not currently working. Plan is to stay at his house (she lives next door). Sister is also supportive. Anticipated Caregiver's Contact Information: see above Ability/Limitations of Caregiver: no limitations for pt's son Caregiver Availability: 24/7 Discharge Plan Discussed with Primary Caregiver: Yes Is Caregiver In Agreement with Plan?: Yes Does Caregiver/Family have Issues with Lodging/Transportation while Pt is in Rehab?: No  Goals/Additional Needs Patient/Family Goal for Rehab: Supervision and Mod Ind with PT/OT/SLP Expected length of stay: 10-14 days Cultural  Considerations: none Dietary Needs: Mechanical soft, liquids - honey by spoon only, full supervision, meds crushed in puree. cough after each bite/sip. Equipment Needs: to be determined Pt/Family Agrees to Admission and willing to participate: Yes (spoke with pt, her sister and son on several occasions) Program Orientation Provided & Reviewed with Pt/Caregiver Including Roles  & Responsibilities: Yes   Decrease burden of Care through IP rehab admission: NA   Possible need for SNF placement upon discharge: not anticipated   Patient Condition: This patient's medical and functional status has changed since the consult dated: 03-20-14 in which the Rehabilitation Physician determined and documented that the patient's condition is appropriate for intensive rehabilitative care in an inpatient rehabilitation facility. See "History of Present Illness" (above) for medical update. Functional changes are: minimal assist of 2 to ambulate 12' and variable assist with seated ADLs. Patient's medical and functional status update has been discussed with the Rehabilitation physician and patient remains appropriate for inpatient rehabilitation. Will admit to inpatient rehab today.  Preadmission Screen Completed By:  Nanetta Batty, PT, 03/27/2014 11:41 AM ______________________________________________________________________   Discussed status with Dr. Naaman Plummer on 03/27/14 at 1 and received telephone approval for admission today.  Admission Coordinator: Nanetta Batty, PT, time1140/Date 03/27/14

## 2014-03-24 NOTE — Progress Notes (Signed)
Speech Language Pathology Treatment: Dysphagia  Patient Details Name: [name] MRN: [id number] DOB: 24-Mar-1956 Today's Date: 03/24/2014 Time: [id number] SLP Time Calculation (min): 10 min  Assessment / Plan / Recommendation Clinical Impression  Skilled dysphagia intervention with family present (sister?).  Pt. Required max verbal reminders to recall and state swallow strategies and moderate to implement during observation.  Mild intermittent throat clearing thought to be reflexive versus recommended swallow precaution.  Continue Dys 1 and honey thick via teaspoon only; she requires continued supervision to perform strategies.   HPI HPI: 58 year old female with h/o COPD, DM, peripheral vascular disease, admitted worsening left lower extremity pain and  and abdominal pain, developed acute hypoxic respiratory failure due to pulmonary edema requiring intubation 9/14-9/21, extubated and reintubated 9/21-9/23, NSTEMI, MSSA bacteremia after recent angiogram. FEES on 9/24 revealed severe acute reversible dysphagia characterized by intubation related sensory deficits, generalized weakness, and moderately severe laryngeal and pharyngeal edema resulting in a delayed swallow initiation and decreased laryngeal closure. Deep penetration and aspiration with inconsistent sensation of all consistencies.  NG placed.  BSE reordered.     Pertinent Vitals Pain Assessment: No/denies pain  SLP Plan  Continue with current plan of care    Recommendations Diet recommendations: Dysphagia 1 (puree);Honey-thick liquid (liquids via spoon) Liquids provided via: Teaspoon Medication Administration: Crushed with puree Supervision: Patient able to self feed;Full supervision/cueing for compensatory strategies Compensations: Slow rate;Small sips/bites;Multiple dry swallows after each bite/sip;Hard cough after swallow Postural Changes and/or Swallow Maneuvers: Seated upright 90 degrees              Oral Care  Recommendations: Oral care BID Follow up Recommendations:  (TBD) Plan: Continue with current plan of care    GO     Royce MacadamiaLitaker, Lucyann Romano Willis 03/24/2014, 1:37 PM  Breck CoonsLisa Willis Lonell FaceLitaker M.Ed [employer]SLP Pager [phone]

## 2014-03-24 NOTE — Progress Notes (Addendum)
PROGRESS NOTE  Subjective:    Underwent PCI of LAD x 3 DES and LCX x 1 DES on 9/30. Worked with PT extensively yesterday. Sat up in chair x 4 hours. Became very tachycardic when standing. CVP 2-3. Weight down 48 pounds from admit.  No CP/SOB.   Objective:    Vital Signs:   Temp:  [97.3 F (36.3 C)-98.6 F (37 C)] 97.3 F (36.3 C) (10/02 0822) Pulse Rate:  [59-76] 62 (10/02 0822) Resp:  [14-18] 14 (10/02 0822) BP: (91-129)/(33-71) 91/47 mmHg (10/02 0822) SpO2:  [96 %-100 %] 96 % (10/02 0822) Weight:  [80.7 kg (177 lb 14.6 oz)] 80.7 kg (177 lb 14.6 oz) (10/02 0305)  Last BM Date: 03/20/14   24-hour weight change: Weight change: 0.5 kg (1 lb 1.6 oz)  Weight trends: Filed Weights   03/22/14 0500 03/23/14 0402 03/24/14 0305  Weight: 84.3 kg (185 lb 13.6 oz) 80.2 kg (176 lb 12.9 oz) 80.7 kg (177 lb 14.6 oz)    Intake/Output:  10/01 0701 - 10/02 0700 In: 415 [I.V.:100; NG/GT:315] Out: 150 [Urine:150]     Physical Exam: BP 91/47  Pulse 62  Temp(Src) 97.3 F (36.3 C) (Oral)  Resp 14  Ht 5\' 2"  (1.575 m)  Wt 80.7 kg (177 lb 14.6 oz)  BMI 32.53 kg/m2  SpO2 96%  Wt Readings from Last 3 Encounters:  03/24/14 80.7 kg (177 lb 14.6 oz)  03/24/14 80.7 kg (177 lb 14.6 oz)  03/24/14 80.7 kg (177 lb 14.6 oz)    General: Vital signs reviewed and noted.   Head: Normocephalic, atraumatic.  Eyes: conjunctivae/corneas clear.  EOM's intact.   Throat: normal  Neck:  IJ in place   Lungs:  Decreased BS throughout no wheezing  Heart:  RRR, no s3 2/6 TR  Abdomen:  Soft, non-tender, non-distended    Extremities:  Warm no edema.   Neurologic: Moves all 4  Psych: A&Ox3    Labs: BMET:  Recent Labs  03/22/14 0500 03/23/14 0400 03/24/14 0430  NA 143 143 141  K 3.8 3.7 3.4*  CL 103 104 103  CO2 29 30 28   GLUCOSE 132* 178* 100*  BUN 18 17 19   CREATININE 0.36* 0.39* 0.43*  CALCIUM 8.5 8.6 8.5  MG 2.1  --   --   PHOS 3.9  --   --     Liver function  tests:  Recent Labs  03/22/14 0500  AST 22  ALT 13  ALKPHOS 54  BILITOT 0.4  PROT 6.1  ALBUMIN 2.4*   No results found for this basename: LIPASE, AMYLASE,  in the last 72 hours  CBC:  Recent Labs  03/22/14 0500 03/23/14 0400  WBC 5.7 5.4  NEUTROABS 3.4  --   HGB 10.7* 10.3*  HCT 34.4* 33.9*  MCV 77.7* 75.8*  PLT 256 266    Cardiac Enzymes: No results found for this basename: CKTOTAL, CKMB, TROPONINI,  in the last 72 hours  Coagulation Studies:  Recent Labs  03/22/14 0500  LABPROT 14.0  INR 1.08    Other: No components found with this basename: POCBNP,  No results found for this basename: DDIMER,  in the last 72 hours  Recent Labs  03/22/14 0500  HGBA1C 7.2*    Recent Labs  03/22/14 0500  CHOL 135  HDL 31*  LDLCALC 69  TRIG 161*  CHOLHDL 4.4   No results found for this basename: TSH, T4TOTAL, FREET3, T3FREE, THYROIDAB,  in the last  72 hours No results found for this basename: VITAMINB12, FOLATE, FERRITIN, TIBC, IRON, RETICCTPCT,  in the last 72 hours   Other results:  EKG :  NSR at 93.  Old ant. MI  Medications:    Infusions: . sodium chloride 10 mL/hr at 03/24/14 0201    Scheduled Medications: . alteplase  2 mg Intracatheter Once  . aspirin  81 mg Oral Daily  . atorvastatin  80 mg Oral q1800  . bisoprolol  5 mg Oral Daily  .  ceFAZolin (ANCEF) IV  2 g Intravenous 3 times per day  . clopidogrel  75 mg Oral Daily  . escitalopram  10 mg Oral Daily  . furosemide  40 mg Oral BID  . gabapentin  300 mg Oral TID  . heparin subcutaneous  5,000 Units Subcutaneous 3 times per day  . insulin aspart  0-20 Units Subcutaneous 6 times per day  . insulin glargine  20 Units Subcutaneous Daily  . losartan  50 mg Oral Daily  . nitroGLYCERIN  0.4 mg Transdermal Q24H  . predniSONE  10 mg Oral Q breakfast  . sodium chloride  10-40 mL Intracatheter Q12H  . spironolactone  25 mg Oral Daily    Assessment/ Plan:    1. NSTEMI with severe three-vessel  coronary artery disease    --PCI of LAD x 3 DES and LCX x 1 DES on 9/30.  2. Acute on chronic systolic congestive heart failure with an EF of 10-15% -> 30-35% 3. Acute respiratory failure 4. Fever/leukocytosis    --MSSA bacteremia 9/15 5. Diabetes mellitus 6. COPD 7. Peripheral vascular disease -status post right iliac stent, status post fem - tibioperoneal trunk. L foot ulcerations 8. Hypernatremia 9. Iron-def anemia  She is s/p PCI of RCA and LAD 9/30. Doing well. P2Y12 therapeutic at 152. Will need life-long DAPT. Discussed risk of stent thrombosis and need to avoid smoking completely in future.   HF under control. She is now dry and most likely orthostatic. Will give 500cc NS and hold lasix until Sunday. On Sunday, will start lasix 40 daily. Continue all other HF agents. We will supp K+.   Hopefully she can get to CIR soon. Ok to transfer there today from our standpoint.     Cadance Raus,MD 10:00 AM

## 2014-03-24 NOTE — Progress Notes (Signed)
[facility] TEAM 1 - Stepdown/ICU TEAM Progress Note  [name] ZOX:[id number] DOB: Jun 11, 1956 DOA: 03/05/2014 PCP: Kirk Ruths, MD  Admit HPI / Brief Narrative: 58 yo female with history of peripheral vascular disease who had right lower extremity runoff 03/01/2014 presented to the ER 9/13 because of worsening left lower extremity pain over 2 days. In the ER patient was seen by Vascular Surgery who did not feel the patient's left lower extremity pain was vascular in origin. In the ER patient was found to be hypotensive and febrile. Blood cultures were obtained with urine cultures. Chest x-ray and UA were unremarkable. On exam patient had mild discharge from the right groin wound which patient stated had been there for 6 weeks. Patient also had mild skin discoloration in the left groin area where she had catheterization.   She went on to develop MSSA positive blood cultures and also Pseudomonas was cultured from her groin wound. She subsequently developed probable aspiration pneumonia then elevated cardiac enzymes. She had severe LV dysfunction on her echocardiogram as well as a cardiac cath noting multivessel CAD, but the vessels were poor targets for grafting. The patient was intubated for 10 days and extubated. She had evidence of persistent pleural effusions from heart failure.  With her sepsis, heart failure, ischemic cardiomyopathy with poor targets, and general deconditioning, malnutrition, and peripheral vascular disease she would not be candidate for CABG. She has had her saphenous vein harvested from her right leg for femoropopliteal bypass and the left leg has evidence of severe venous stasis and probably poor quality conduit.   After medical stabilization, she underwent PCI of LAD x 3 DES and LCX x 1 DES on 9/30.  The CHF has continued to attend to her volume management/diuresis.  Her post extubation dysphagia has slowly improved, and she was able to have her NG tube removed on 10/1,  with advancement of diet.    She is being transferred to a tele floor for ongoing volume management (actually a bit dry today) and ongoing PT/OT.  We are hopeful that she will ab able to transfer to CIR early next week.    Significant Events:  9/13 PM - admitted  9/14 hypoxia, hypotension, +Trop I - intubated  9/15 pos BC MSSA  9/16 TEE > see below 9/17 neg 2 liters, still bronchospastic 9/19 extubated  9/21 acute hypoxemic respiratory failure, re-intubated, Cardiology started milrinone  9/23 thora left side 500c removed, extubated  9/24 failed swallow evaluation; TCTS consult > not a CABG candidate, rec medical management, transfused 1 U PRBC 9/26 TRH assumed care 9/30  PCI of LAD x 3 DES and LCX x 1 DES  10/1 passed MBS - NG removed - diet begun  10/2 transferred to tele bed  HPI/Subjective: The pt states that she feels somewhat dizzy when she stands up.  She denies current sob or cp.  No n/v or abdom pain.    Assessment/Plan:  Acute hypoxic resp failure due to ischemia and flash edema  Resolved - extubated  Bilateral effusions L >R > transudate only 500cc removed 9/23 from left - no respiratory distress at this time   NSTEMI with severe three-vessel CAD - not a CABG candidate  Care as per Cardiology - s/p complex PCI of multiple lesions - will need "lifelong dual antiplatelet therapy with aspirin and clopidogrel"  Ischemic cardiomyopathy / Acute on chronic systolic congestive heart failure with an EF of 10-15%  Care as per CHF Team   Dysphagia Felt to  be due to deconditioning + damage/irritation related to intubation/ET tube - passed a swallow study 10/1 > started on dysphagia 1 diet, honey thick liquids - discontinued PANDA tube - tolerating well thus far   Septic shock resolved   MSSA bacteremia 2/2 blood cx  after recent angiogram but source uncertain - TEE negative, cultures now clear - cefazolin 3 weeks total per ID (stop date 03/25/2014) - repeat blood cx 9/22  remarkable for 1/2 coag negative Staph c/w a contaminant   ?HCAP 9/22 > doubt quantitative culture negative from BAL   Pseudomonas R groin wound  abx course as noted above   Tobacco abuse   COPD w/ acute exacerbation  Acute exacerbation has resolved - stable at this time   Hypophosphatemia Resolved   Hypokalemia  Replacing as needed   Chronic anemia  Hgb stable/climbing   L inguinal hematoma without evidence of bleeding  DM  CBG erratic - follow w/o change today   Code Status: FULL Family Communication: no family present at time of exam  Disposition Plan: stable for transfer to tele bed   Consultants: CHF Team TCTS PCCM > [facility]   Procedures: 9/14 Echo- EF 30-35%  9/16 TEE- no LAA thrombus, no valvular vegetation, anterior, anteroseptal, apical, and inferioapical hypokinesis, EF 35%  9/17 EKG- sinus tach, low voltage QRS, cannot r/o anteroseptal infarct  9/18 LHC- 40% distal left main, severe diffuse disease from LAD, 95% stenosis circ, focal 75% mid RCA, LVEF 15%  9/21 L groin vascular ultrasound >>> no evidence of complication around graft site  9/29 - cardiac cath - PTCA and stenting of the left circumflex, PTCA and stenting of the proximal, mid, and distal-LAD  Antibiotics: Vanc 9/14 > 9/17 + 9/22 > 9/25  Cefepime 9/14 > 9/23  Ancef 9/21 + 9/25 > Doxycycline 9/24 > 9/28  DVT prophylaxis: SQ heparin   Objective: Blood pressure 91/47, pulse 62, temperature 97.3 F (36.3 C), temperature source Oral, resp. rate 14, height 5\' 2"  (1.575 m), weight 80.7 kg (177 lb 14.6 oz), SpO2 96.00%.  Intake/Output Summary (Last 24 hours) at 03/24/14 1023 Last data filed at 03/23/14 1700  Gross per 24 hour  Intake    250 ml  Output    150 ml  Net    100 ml   Exam: General: No acute respiratory distress Lungs: Clear to auscultation bilaterally without wheezes or crackles Cardiovascular: Regular rate and rhythm without murmur gallop or rub Abdomen: obese,  nontender, nondistended, soft, bowel sounds positive, no rebound, no ascites, no appreciable mass Extremities: No significant cyanosis, clubbing, edema bilateral lower extremities  Data Reviewed: Basic Metabolic Panel:  Recent Labs Lab 03/17/14 1505 03/18/14 0355 03/18/14 1525 03/19/14 0630  03/20/14 0500 03/21/14 0616 03/22/14 0500 03/23/14 0400 03/24/14 0430  NA  --  141  --   --   < > 144 142 143 143 141  K  --  3.9  --   --   < > 3.7 3.6* 3.8 3.7 3.4*  CL  --  104  --   --   < > 106 102 103 104 103  CO2  --  29  --   --   < > 29 31 29 30 28   GLUCOSE  --  207*  --   --   < > 185* 224* 132* 178* 100*  BUN  --  17  --   --   < > 17 19 18 17 19   CREATININE  --  0.37*  --   --   < > 0.34* 0.36* 0.36* 0.39* 0.43*  CALCIUM  --  8.0*  --   --   < > 7.9* 8.4 8.5 8.6 8.5  MG 2.0 2.0 2.1 2.0  --   --   --  2.1  --   --   PHOS 3.0 4.3 3.6 3.2  --   --   --  3.9  --   --   < > = values in this interval not displayed.  Liver Function Tests:  Recent Labs Lab 03/20/14 0500 03/22/14 0500  AST 17 22  ALT 16 13  ALKPHOS 47 54  BILITOT 0.3 0.4  PROT 5.5* 6.1  ALBUMIN 2.3* 2.4*   CBC:  Recent Labs Lab 03/19/14 0630 03/22/14 0500 03/23/14 0400  WBC 6.4 5.7 5.4  NEUTROABS  --  3.4  --   HGB 9.5* 10.7* 10.3*  HCT 30.8* 34.4* 33.9*  MCV 73.9* 77.7* 75.8*  PLT 236 256 266   CBG:  Recent Labs Lab 03/23/14 1707 03/23/14 2048 03/24/14 03/24/14 0302 03/24/14 0825  GLUCAP 156* 299* 106* 83 155*    Recent Results (from the past 240 hour(s))  CULTURE, BLOOD (ROUTINE X 2)     Status: None   Collection Time    03/14/14 11:06 AM      Result Value Ref Range Status   Specimen Description BLOOD LEFT HAND   Final   Special Requests BOTTLES DRAWN AEROBIC AND ANAEROBIC 10CC   Final   Culture  Setup Time     Final   Value: 03/14/2014 16:45     Performed at [facility]   Culture     Final   Value: STAPHYLOCOCCUS SPECIES (COAGULASE NEGATIVE)     Note: THE SIGNIFICANCE  OF ISOLATING THIS ORGANISM FROM A SINGLE SET OF BLOOD CULTURES WHEN MULTIPLE SETS ARE DRAWN IS UNCERTAIN. PLEASE NOTIFY THE MICROBIOLOGY DEPARTMENT WITHIN ONE WEEK IF SPECIATION AND SENSITIVITIES ARE REQUIRED.     Note: Gram Stain Report Called to,Read Back By and Verified With: MONICA CROSS TAKEN FOR NURSE 12:55AM 03/16/14 THOMI     Performed at [facility]   Report Status 03/17/2014 FINAL   Final  CULTURE, BLOOD (ROUTINE X 2)     Status: None   Collection Time    03/14/14 11:16 AM      Result Value Ref Range Status   Specimen Description BLOOD RIGHT HAND   Final   Special Requests BOTTLES DRAWN AEROBIC AND ANAEROBIC 10CC   Final   Culture  Setup Time     Final   Value: 03/14/2014 16:43     Performed at [facility]   Culture     Final   Value: NO GROWTH 5 DAYS     Performed at [facility]   Report Status 03/20/2014 FINAL   Final  CULTURE, BAL-QUANTITATIVE     Status: None   Collection Time    03/14/14 12:47 PM      Result Value Ref Range Status   Specimen Description BRONCHIAL ALVEOLAR LAVAGE   Final   Special Requests Normal   Final   Gram Stain     Final   Value: FEW WBC PRESENT,BOTH PMN AND MONONUCLEAR     RARE SQUAMOUS EPITHELIAL CELLS PRESENT     NO ORGANISMS SEEN     Performed at [facility]   Colony Count     Final   Value: 10,000 COLONIES/ML  Performed at [facility]     Final   Value: Non-Pathogenic Oropharyngeal-type Flora Isolated.     Performed at [facility]   Report Status 03/16/2014 FINAL   Final  AFB CULTURE WITH SMEAR     Status: None   Collection Time    03/15/14  2:33 PM      Result Value Ref Range Status   Specimen Description FLUID LEFT PLEURAL   Final   Special Requests NONE   Final   Acid Fast Smear     Final   Value: NO ACID FAST BACILLI SEEN     Performed at [facility]   Culture     Final   Value: CULTURE WILL BE EXAMINED FOR 6 WEEKS BEFORE ISSUING A FINAL REPORT      Performed at [facility]   Report Status PENDING   Incomplete  BODY FLUID CULTURE     Status: None   Collection Time    03/15/14  2:33 PM      Result Value Ref Range Status   Specimen Description FLUID LEFT PLEURAL   Final   Special Requests NONE   Final   Gram Stain     Final   Value: RARE WBC PRESENT, PREDOMINANTLY MONONUCLEAR     NO ORGANISMS SEEN     Performed at [facility]   Culture     Final   Value: NO GROWTH 3 DAYS     Performed at [facility]   Report Status 03/19/2014 FINAL   Final  FUNGUS CULTURE W SMEAR     Status: None   Collection Time    03/15/14  2:33 PM      Result Value Ref Range Status   Specimen Description PLEURAL LEFT   Final   Special Requests NONE   Final   Fungal Smear     Final   Value: NO YEAST OR FUNGAL ELEMENTS SEEN     Performed at [facility]   Culture     Final   Value: CULTURE IN PROGRESS FOR FOUR WEEKS     Performed at [facility]   Report Status PENDING   Incomplete  MRSA PCR SCREENING     Status: None   Collection Time    03/22/14  4:35 PM      Result Value Ref Range Status   MRSA by PCR NEGATIVE  NEGATIVE Final   Comment:            The GeneXpert MRSA Assay (FDA     approved for NASAL specimens     only), is one component of a     comprehensive MRSA colonization     surveillance program. It is not     intended to diagnose MRSA     infection nor to guide or     monitor treatment for     MRSA infections.     Studies:  Recent x-ray studies have been reviewed in detail by the Attending Physician  Scheduled Meds:  Scheduled Meds: . alteplase  2 mg Intracatheter Once  . aspirin  81 mg Oral Daily  . atorvastatin  80 mg Oral q1800  . bisoprolol  5 mg Oral Daily  .  ceFAZolin (ANCEF) IV  2 g Intravenous 3 times per day  . clopidogrel  75 mg Oral Daily  . escitalopram  10 mg Oral Daily  . [START ON 03/26/2014] furosemide  40 mg Oral Daily  .  gabapentin  300 mg Oral TID  . heparin  subcutaneous  5,000 Units Subcutaneous 3 times per day  . insulin aspart  0-20 Units Subcutaneous 6 times per day  . insulin glargine  20 Units Subcutaneous Daily  . losartan  50 mg Oral Daily  . nitroGLYCERIN  0.4 mg Transdermal Q24H  . potassium chloride  60 mEq Oral Once  . predniSONE  10 mg Oral Q breakfast  . sodium chloride  500 mL Intravenous Once  . sodium chloride  10-40 mL Intracatheter Q12H  . spironolactone  25 mg Oral Daily    Time spent on care of this patient: 35 mins   Makailyn Mccormick T , MD   [facility] Office  [phone] Pager - Text Page per Loretha Stapler as per below:  On-Call/Text Page:      [email or web address]      password [email or web address]  If 7PM-7AM, please contact night-coverage [email or web address] Password [email or web address] 03/24/2014, 10:23 AM   LOS: 19 days

## 2014-03-25 DIAGNOSIS — J9601 Acute respiratory failure with hypoxia: Secondary | ICD-10-CM

## 2014-03-25 DIAGNOSIS — I5023 Acute on chronic systolic (congestive) heart failure: Secondary | ICD-10-CM

## 2014-03-25 DIAGNOSIS — J189 Pneumonia, unspecified organism: Secondary | ICD-10-CM

## 2014-03-25 DIAGNOSIS — I255 Ischemic cardiomyopathy: Secondary | ICD-10-CM

## 2014-03-25 DIAGNOSIS — R509 Fever, unspecified: Secondary | ICD-10-CM

## 2014-03-25 DIAGNOSIS — A4101 Sepsis due to Methicillin susceptible Staphylococcus aureus: Secondary | ICD-10-CM

## 2014-03-25 DIAGNOSIS — J9602 Acute respiratory failure with hypercapnia: Secondary | ICD-10-CM

## 2014-03-25 DIAGNOSIS — I214 Non-ST elevation (NSTEMI) myocardial infarction: Secondary | ICD-10-CM

## 2014-03-25 LAB — BASIC METABOLIC PANEL
Anion gap: 9 (ref 5–15)
BUN: 12 mg/dL (ref 6–23)
CHLORIDE: 106 meq/L (ref 96–112)
CO2: 27 meq/L (ref 19–32)
CREATININE: 0.39 mg/dL — AB (ref 0.50–1.10)
Calcium: 8.4 mg/dL (ref 8.4–10.5)
GFR calc Af Amer: >90 mL/min (ref >90)
GFR calc non Af Amer: >90 mL/min (ref >90)
GLUCOSE: 147 mg/dL — AB (ref 70–99)
POTASSIUM: 4.2 meq/L (ref 3.7–5.3)
Sodium: 142 mEq/L (ref 137–147)

## 2014-03-25 LAB — CBC
HEMATOCRIT: 31.4 % — AB (ref 36.0–46.0)
HEMOGLOBIN: 9.8 g/dL — AB (ref 12.0–15.0)
MCH: 23.8 pg — ABNORMAL LOW (ref 26.0–34.0)
MCHC: 31.2 g/dL (ref 30.0–36.0)
MCV: 76.2 fL — AB (ref 78.0–100.0)
Platelets: 242 10*3/uL (ref 150–400)
RBC: 4.12 MIL/uL (ref 3.87–5.11)
RDW: 21 % — ABNORMAL HIGH (ref 11.5–15.5)
WBC: 5 10*3/uL (ref 4.0–10.5)

## 2014-03-25 LAB — GLUCOSE, CAPILLARY
GLUCOSE-CAPILLARY: 143 mg/dL — AB (ref 70–99)
GLUCOSE-CAPILLARY: 160 mg/dL — AB (ref 70–99)
Glucose-Capillary: 248 mg/dL — ABNORMAL HIGH (ref 70–99)
Glucose-Capillary: 176 mg/dL — ABNORMAL HIGH (ref 70–99)

## 2014-03-25 NOTE — Progress Notes (Signed)
Physical Therapy Treatment Patient Details Name: Kaitlin RainsCheryl C [name] MRN: [id number] DOB: [birth year] Today's Date: 03/25/2014    History of Present Illness 58 year old female with h/o COPD, DM, peripheral vascular disease, admitted worsening left lower extremity pain and and abdominal pain, developed acute hypoxic respiratory failure due to pulmonary edema requiring intubation, NSTEMI, MSSA bacteremia after recent angiogram. Initial CXR 9/13 clear however CXR 9/14 notes right basilar airspace opacity may reflect pneumonia.  s/p cardiac catheterization 03/22/14.    PT Comments    Pt making great progress with incr'd gait distance today and incr'd exercise and needing less assistance with mobility. Pt left in recliner chair after session. Reports she sat up on edge of bed for breakfast and lunch today and that she has been doing her "foot exercises" all day. Pt still most appropriate for CIR post acute for continued rehab.   Follow Up Recommendations  CIR;Supervision/Assistance - 24 hour     Equipment Recommendations  Other (comment) (TBD)    Recommendations for Other Services Rehab consult     Precautions / Restrictions Precautions Precautions: Fall Precaution Comments: fell 9/28 with nursing (missed EOB when sitting from stedy) Restrictions Weight Bearing Restrictions: No    Mobility  Bed Mobility Overal bed mobility: Needs Assistance       Supine to sit: Supervision     General bed mobility comments: with rail and HOB elevated pt able to get self seated EOB with incr time, no physical assist needed, cues on ease of technique.  Transfers Overall transfer level: Needs assistance Equipment used: Rolling walker (2 wheeled) Transfers: Sit to/from Stand Sit to Stand: Min assist         General transfer comment: sit/stand x3 reps total: x1 from lowest bed position, x2 from recliner chair. all with min assist to power up and cues on technique/ant weight shift to assist with  standing.  Ambulation/Gait Ambulation/Gait assistance: Min assist;+2 safety/equipment Ambulation Distance (Feet): 12 Feet Assistive device: Rolling walker (2 wheeled) Gait Pattern/deviations: Step-through pattern;Decreased stride length;Trunk flexed;Wide base of support Gait velocity: Decreased Gait velocity interpretation: Below normal speed for age/gender General Gait Details: cues on posture, walker position and to incr right foot clearance with gait. all gait was forward (going toward door) with chair follow for safety.   Stairs            Wheelchair Mobility    Modified Rankin (Stroke Patients Only)       Cognition Arousal/Alertness: Awake/alert Behavior During Therapy: WFL for tasks assessed/performed Overall Cognitive Status: Within Functional Limits for tasks assessed                      Exercises General Exercises - Lower Extremity Long Arc Quad: AROM;Strengthening;Both;10 reps;Seated Hip Flexion/Marching: AROM;Strengthening;Both;10 reps;Standing;Limitations Hip Flexion/Marching Limitations: with RW for support, with cues pt able to get knee high as bar of walker. assist for balance. Toe Raises: AROM;Strengthening;Both;10 reps;Seated Heel Raises: AROM;Strengthening;Both;10 reps;Seated Mini-Sqauts: AROM;Strengthening;Both;10 reps;Standing;Limitations Mini Squats Limitations: with RW support, required cues for proper ex form (decr trunk fwd flexion, incr knee flexion).        Pertinent Vitals/Pain Pain Assessment: No/denies pain Pain Score: 0-No pain           PT Goals (current goals can now be found in the care plan section) Acute Rehab PT Goals Patient Stated Goal: to get to the bathroom, to walk PT Goal Formulation: With patient Time For Goal Achievement: 03/31/14 Potential to Achieve Goals: Good Progress towards PT goals:  Progressing toward goals    Frequency  Min 3X/week    PT Plan Current plan remains appropriate       End of  Session Equipment Utilized During Treatment: Gait belt Activity Tolerance: Patient tolerated treatment well Patient left: in chair;with call bell/phone within reach;with family/visitor present     Time: 1425-1450 PT Time Calculation (min): 25 min  Charges:  $Gait Training: 8-22 mins $Therapeutic Exercise: 8-22 mins                    G Codes:      Sallyanne Kuster Apr 04, 2014, 2:59 PM  Sallyanne Kuster, PTA Office- [phone]

## 2014-03-25 NOTE — Progress Notes (Signed)
[facility] PROGRESS NOTE Interim History: 58 yo female with history of peripheral vascular disease who had right lower extremity runoff 03/01/2014 presented to the ER 9/13 because of worsening left lower extremity pain over 2 days. In the ER patient was found to be hypotensive and febrile,develop MSSA positive blood cultures and also Pseudomonas was cultured from her groin wound, developed  aspiration pneumonia, She had severe LV dysfunction on her echocardiogram as well as a cardiac cath noting multivessel CAD, but the vessels were poor targets for grafting, patient was intubated for 10 days and extubated. She had evidence of persistent pleural effusions from heart failure. With her sepsis, heart failure, ischemic cardiomyopathy with poor targets, and general deconditioning, malnutrition, and peripheral vascular disease she would not be candidate for CABG, After medical stabilization, she underwent PCI of LAD x 3 DES and LCX x 1 DES on 9/30. The HF team has continued to attend to her volume management/diuresis. Her post extubation dysphagia has slowly improved, and she was able to have her NG tube removed on 10/1, with advancement of diet.    Assessment/Plan: Acute hypoxic resp failure due to ischemia and flash edema: - Resolved - extubated.  Bilateral effusions L >R > transudate: - 500cc removed 9/23 from left - no respiratory distress at this time.  NSTEMI with severe three-vessel CAD - not a CABG candidate: - Per Cardiology - s/p complex PCI of multiple lesions - will need lifelong dual antiplatelet therapy with aspirin and clopidogrel.  Ischemic cardiomyopathy / Acute on chronic systolic congestive heart failure with an EF of 10-15%: - Per CHF Team  - slightly positive. Was given 500cc and holding lasix until Sunday, then start lasix 40 daily.  Dysphagia: - Felt to be due to deconditioning + damage/irritation related to intubation/ET tube - passed a swallow study 10/1 > started  on dysphagia 1 diet, honey thick liquids - discontinued PANDA tube - tolerating well thus far   Septic shock:  - resolved, due to MSSA  MSSA bacteremia 2/2 blood cx: - After recent angiogram but source uncertain - TEE negative, cultures now clear - cefazolin 3 weeks total per ID (stop date 03/25/2014) - repeat blood cx 9/22 remarkable for 1/2 coag negative Staph c/w a contaminant   Pseudomonas R groin wound:  abx course as noted above   Tobacco abuse:  COPD w/ acute exacerbation  Acute exacerbation has resolved - stable at this time   Hypophosphatemia  Resolved.  Code Status: FULL  Family Communication: no family present at time of exam  Disposition Plan: stable for transfer to tele bed     Consultants: CHF Team  TCTS  [facility]  Cardiology    Procedures: Significant Events:  9/13 PM - admitted  9/14 hypoxia, hypotension, +Trop I - intubated  9/15 pos BC MSSA  9/16 TEE > see below  9/17 neg 2 liters, still bronchospastic  9/19 extubated  9/21 acute hypoxemic respiratory failure, re-intubated, Cardiology started milrinone  9/23 thora left side 500c removed, extubated  9/24 failed swallow evaluation; TCTS consult > not a CABG candidate, rec medical management, transfused 1 U PRBC  9/26 [facility] assumed care  9/30 PCI of LAD x 3 DES and LCX x 1 DES  10/1 passed MBS - NG removed - diet begun  10/2 transferred to tele bed  9/14 Echo- EF 30-35%  9/16 TEE- no LAA thrombus, no valvular vegetation, anterior, anteroseptal, apical, and inferioapical hypokinesis, EF 35%  9/17 EKG- sinus tach, low voltage  QRS, cannot r/o anteroseptal infarct  9/18 LHC- 40% distal left main, severe diffuse disease from LAD, 95% stenosis circ, focal 75% mid RCA, LVEF 15%  9/21 L groin vascular ultrasound >>> no evidence of complication around graft site  9/29 - cardiac cath - PTCA and stenting of the left circumflex, PTCA and stenting of the proximal, mid, and distal-LAD   Antibiotics: Vanc 9/14 >  9/17 + 9/22 > 9/25  Cefepime 9/14 > 9/23  Ancef 9/21 + 9/25 >  Doxycycline 9/24 > 9/28   HPI/Subjective: No complains  Objective: Filed Vitals:   03/24/14 1601 03/24/14 1747 03/24/14 2014 03/25/14 0449  BP: 112/32 118/49 109/52 136/62  Pulse: 61 66 63 71  Temp: 98 F (36.7 C) 98.5 F (36.9 C) 97.6 F (36.4 C) 97.8 F (36.6 C)  TempSrc: Oral Oral Oral Oral  Resp: 19 20 18 18   Height:  5\' 2"  (1.575 m)    Weight:  80.2 kg (176 lb 12.9 oz)  80.1 kg (176 lb 9.4 oz)  SpO2: 98% 99% 99% 100%    Intake/Output Summary (Last 24 hours) at 03/25/14 0847 Last data filed at 03/25/14 0962  Gross per 24 hour  Intake   1080 ml  Output    600 ml  Net    480 ml   Filed Weights   03/24/14 0305 03/24/14 1747 03/25/14 0449  Weight: 80.7 kg (177 lb 14.6 oz) 80.2 kg (176 lb 12.9 oz) 80.1 kg (176 lb 9.4 oz)    Exam:  General: Alert, awake, oriented x3, in no acute distress.  HEENT: No bruits, no goiter.  Heart: Regular rate and rhythm. Lungs: Good air movement, clear Abdomen: Soft, nontender, nondistended, positive bowel sounds.  Neuro: Grossly intact, nonfocal.   Data Reviewed: Basic Metabolic Panel:  Recent Labs Lab 03/18/14 1525 03/19/14 0630  03/21/14 0616 03/22/14 0500 03/23/14 0400 03/24/14 0430 03/25/14 0140  NA  --   --   < > 142 143 143 141 142  K  --   --   < > 3.6* 3.8 3.7 3.4* 4.2  CL  --   --   < > 102 103 104 103 106  CO2  --   --   < > 31 29 30 28 27   GLUCOSE  --   --   < > 224* 132* 178* 100* 147*  BUN  --   --   < > 19 18 17 19 12   CREATININE  --   --   < > 0.36* 0.36* 0.39* 0.43* 0.39*  CALCIUM  --   --   < > 8.4 8.5 8.6 8.5 8.4  MG 2.1 2.0  --   --  2.1  --   --   --   PHOS 3.6 3.2  --   --  3.9  --   --   --   < > = values in this interval not displayed. Liver Function Tests:  Recent Labs Lab 03/20/14 0500 03/22/14 0500  AST 17 22  ALT 16 13  ALKPHOS 47 54  BILITOT 0.3 0.4  PROT 5.5* 6.1  ALBUMIN 2.3* 2.4*   No results found for this  basename: LIPASE, AMYLASE,  in the last 168 hours No results found for this basename: AMMONIA,  in the last 168 hours CBC:  Recent Labs Lab 03/19/14 0630 03/22/14 0500 03/23/14 0400 03/25/14 0140  WBC 6.4 5.7 5.4 5.0  NEUTROABS  --  3.4  --   --  HGB 9.5* 10.7* 10.3* 9.8*  HCT 30.8* 34.4* 33.9* 31.4*  MCV 73.9* 77.7* 75.8* 76.2*  PLT 236 256 266 242   Cardiac Enzymes: No results found for this basename: CKTOTAL, CKMB, CKMBINDEX, TROPONINI,  in the last 168 hours BNP (last 3 results)  Recent Labs  03/06/14 1015 03/19/14 0630  PROBNP 8842.0* 4761.0*   CBG:  Recent Labs Lab 03/24/14 0825 03/24/14 1149 03/24/14 1706 03/24/14 2102 03/25/14 0648  GLUCAP 155* 230* 226* 89 143*    Recent Results (from the past 240 hour(s))  AFB CULTURE WITH SMEAR     Status: None   Collection Time    03/15/14  2:33 PM      Result Value Ref Range Status   Specimen Description FLUID LEFT PLEURAL   Final   Special Requests NONE   Final   Acid Fast Smear     Final   Value: NO ACID FAST BACILLI SEEN     Performed at [facility]   Culture     Final   Value: CULTURE WILL BE EXAMINED FOR 6 WEEKS BEFORE ISSUING A FINAL REPORT     Performed at [facility]   Report Status PENDING   Incomplete  BODY FLUID CULTURE     Status: None   Collection Time    03/15/14  2:33 PM      Result Value Ref Range Status   Specimen Description FLUID LEFT PLEURAL   Final   Special Requests NONE   Final   Gram Stain     Final   Value: RARE WBC PRESENT, PREDOMINANTLY MONONUCLEAR     NO ORGANISMS SEEN     Performed at [facility]   Culture     Final   Value: NO GROWTH 3 DAYS     Performed at [facility]   Report Status 03/19/2014 FINAL   Final  FUNGUS CULTURE W SMEAR     Status: None   Collection Time    03/15/14  2:33 PM      Result Value Ref Range Status   Specimen Description PLEURAL LEFT   Final   Special Requests NONE   Final   Fungal Smear     Final   Value:  NO YEAST OR FUNGAL ELEMENTS SEEN     Performed at [facility]   Culture     Final   Value: CULTURE IN PROGRESS FOR FOUR WEEKS     Performed at [facility]   Report Status PENDING   Incomplete  MRSA PCR SCREENING     Status: None   Collection Time    03/22/14  4:35 PM      Result Value Ref Range Status   MRSA by PCR NEGATIVE  NEGATIVE Final   Comment:            The GeneXpert MRSA Assay (FDA     approved for NASAL specimens     only), is one component of a     comprehensive MRSA colonization     surveillance program. It is not     intended to diagnose MRSA     infection nor to guide or     monitor treatment for     MRSA infections.     Studies: No results found.  Scheduled Meds: . alteplase  2 mg Intracatheter Once  . aspirin  81 mg Oral Daily  . atorvastatin  80 mg Oral q1800  . bisoprolol  5 mg Oral  Daily  .  ceFAZolin (ANCEF) IV  2 g Intravenous 3 times per day  . clopidogrel  75 mg Oral Daily  . escitalopram  10 mg Oral Daily  . [START ON 03/26/2014] furosemide  40 mg Oral Daily  . gabapentin  300 mg Oral TID  . heparin subcutaneous  5,000 Units Subcutaneous 3 times per day  . insulin aspart  0-20 Units Subcutaneous TID WC  . insulin glargine  20 Units Subcutaneous Daily  . losartan  50 mg Oral Daily  . nitroGLYCERIN  0.4 mg Transdermal Q24H  . predniSONE  10 mg Oral Q breakfast  . sodium chloride  10-40 mL Intracatheter Q12H  . spironolactone  25 mg Oral Daily   Continuous Infusions: . sodium chloride 10 mL/hr at 03/24/14 0201     Marinda Elk  [facility] Pager [phone]. If 8PM-8AM, please contact night-coverage at [email or web address], password [facility] 03/25/2014, 8:47 AM  LOS: 20 days

## 2014-03-25 NOTE — Progress Notes (Signed)
CARDIAC REHAB PHASE I   Noted pt is improving with PT but not ready fror CR ambulation yet. Discussed with pt MI, stents, Plavix, smoking cessation, diet, low sodium, daily wts and NTG. Pt very receptive and very motivated to quit smoking. Will sign off. [phone]  Elissa LovettReeve, Dmiya Malphrus WakefieldKristan CES, ACSM 03/25/2014 9:36 AM

## 2014-03-25 NOTE — Progress Notes (Signed)
PROGRESS NOTE  Subjective:    Underwent PCI of LAD x 3 DES and LCX x 1 DES on 9/30. Worked with PT extensively yesterday. Sat up in chair x 4 hours. Became very tachycardic when standing. CVP 2-3. Weight down 48 pounds from admit.  No CP/SOB.   Objective:    Vital Signs:   Temp:  [97.6 F (36.4 C)-98.5 F (36.9 C)] 97.8 F (36.6 C) (10/03 0449) Pulse Rate:  [61-71] 71 (10/03 0449) Resp:  [18-20] 18 (10/03 0449) BP: (109-136)/(32-62) 136/62 mmHg (10/03 0449) SpO2:  [97 %-100 %] 100 % (10/03 0449) Weight:  [176 lb 9.4 oz (80.1 kg)-176 lb 12.9 oz (80.2 kg)] 176 lb 9.4 oz (80.1 kg) (10/03 0449)  Last BM Date: 03/20/14   24-hour weight change: Weight change: -1 lb 1.6 oz (-0.5 kg)  Weight trends: Filed Weights   03/24/14 0305 03/24/14 1747 03/25/14 0449  Weight: 177 lb 14.6 oz (80.7 kg) 176 lb 12.9 oz (80.2 kg) 176 lb 9.4 oz (80.1 kg)    Intake/Output:  10/02 0701 - 10/03 0700 In: 1080 [P.O.:780; IV Piggyback:300] Out: 800 [Urine:800]     Physical Exam: BP 136/62  Pulse 71  Temp(Src) 97.8 F (36.6 C) (Oral)  Resp 18  Ht 5\' 2"  (1.575 m)  Wt 176 lb 9.4 oz (80.1 kg)  BMI 32.29 kg/m2  SpO2 100%  Wt Readings from Last 3 Encounters:  03/25/14 176 lb 9.4 oz (80.1 kg)  03/25/14 176 lb 9.4 oz (80.1 kg)  03/25/14 176 lb 9.4 oz (80.1 kg)    General: Vital signs reviewed and noted.   Head: Normocephalic, atraumatic.  Eyes: conjunctivae/corneas clear.  EOM's intact.   Throat: normal  Neck:  IJ in place   Lungs:  Decreased BS throughout no wheezing  Heart:  RRR, no s3 2/6 TR  Abdomen:  Soft, non-tender, non-distended    Extremities:  Warm no edema.   Neurologic: Moves all 4  Psych: A&Ox3    Labs: BMET:  Recent Labs  03/24/14 0430 03/25/14 0140  NA 141 142  K 3.4* 4.2  CL 103 106  CO2 28 27  GLUCOSE 100* 147*  BUN 19 12  CREATININE 0.43* 0.39*  CALCIUM 8.5 8.4    Liver function tests: No results found for this basename: AST, ALT, ALKPHOS,  BILITOT, PROT, ALBUMIN,  in the last 72 hours No results found for this basename: LIPASE, AMYLASE,  in the last 72 hours  CBC:  Recent Labs  03/23/14 0400 03/25/14 0140  WBC 5.4 5.0  HGB 10.3* 9.8*  HCT 33.9* 31.4*  MCV 75.8* 76.2*  PLT 266 242    Cardiac Enzymes: No results found for this basename: CKTOTAL, CKMB, TROPONINI,  in the last 72 hours  Coagulation Studies: No results found for this basename: LABPROT, INR,  in the last 72 hours  Other: No components found with this basename: POCBNP,  No results found for this basename: DDIMER,  in the last 72 hours No results found for this basename: HGBA1C,  in the last 72 hours No results found for this basename: CHOL, HDL, LDLCALC, TRIG, CHOLHDL,  in the last 72 hours No results found for this basename: TSH, T4TOTAL, FREET3, T3FREE, THYROIDAB,  in the last 72 hours No results found for this basename: VITAMINB12, FOLATE, FERRITIN, TIBC, IRON, RETICCTPCT,  in the last 72 hours   Other results:  EKG :  NSR at 93.  Old ant. MI  Medications:  Infusions: . sodium chloride 10 mL/hr at 03/24/14 0201    Scheduled Medications: . alteplase  2 mg Intracatheter Once  . aspirin  81 mg Oral Daily  . atorvastatin  80 mg Oral q1800  . bisoprolol  5 mg Oral Daily  .  ceFAZolin (ANCEF) IV  2 g Intravenous 3 times per day  . clopidogrel  75 mg Oral Daily  . escitalopram  10 mg Oral Daily  . [START ON 03/26/2014] furosemide  40 mg Oral Daily  . gabapentin  300 mg Oral TID  . heparin subcutaneous  5,000 Units Subcutaneous 3 times per day  . insulin aspart  0-20 Units Subcutaneous TID WC  . insulin glargine  20 Units Subcutaneous Daily  . losartan  50 mg Oral Daily  . nitroGLYCERIN  0.4 mg Transdermal Q24H  . predniSONE  10 mg Oral Q breakfast  . sodium chloride  10-40 mL Intracatheter Q12H  . spironolactone  25 mg Oral Daily    Assessment/ Plan:    1. NSTEMI with severe three-vessel coronary artery disease    --PCI of LAD x 3  DES and LCX x 1 DES on 9/30.  2. Acute on chronic systolic congestive heart failure with an EF of 10-15% -> 30-35% 3. Acute respiratory failure 4. Fever/leukocytosis    --MSSA bacteremia 9/15 5. Diabetes mellitus 6. COPD 7. Peripheral vascular disease -status post right iliac stent, status post fem - tibioperoneal trunk. L foot ulcerations 8. Hypernatremia 9. Iron-def anemia  PLAN: 1. She is s/p PCI of RCA and LAD 9/30. Doing well. P2Y12 therapeutic at 152. Will need life-long DAPT 2. Hold diuretics today and resume oral lasix 40 mg daily on Sunday.  3. Hopefully she can get to CIR soon. PT recommending and CIR will re-evaluate on Monday.  4. Ok to transfer to CIR from our standpoint.   Chrystie NoseKenneth C. Hilty, MD, [facility] Attending Cardiologist [facility]  HILTY,Kenneth C 9:19 AM

## 2014-03-26 DIAGNOSIS — I2510 Atherosclerotic heart disease of native coronary artery without angina pectoris: Secondary | ICD-10-CM

## 2014-03-26 DIAGNOSIS — Z9861 Coronary angioplasty status: Secondary | ICD-10-CM

## 2014-03-26 DIAGNOSIS — I255 Ischemic cardiomyopathy: Secondary | ICD-10-CM | POA: Diagnosis present

## 2014-03-26 LAB — BASIC METABOLIC PANEL
ANION GAP: 8 (ref 5–15)
BUN: 9 mg/dL (ref 6–23)
CO2: 28 meq/L (ref 19–32)
CREATININE: 0.4 mg/dL — AB (ref 0.50–1.10)
Calcium: 8.7 mg/dL (ref 8.4–10.5)
Chloride: 102 mEq/L (ref 96–112)
GFR calc Af Amer: >90 mL/min (ref >90)
Glucose, Bld: 148 mg/dL — ABNORMAL HIGH (ref 70–99)
Potassium: 4 mEq/L (ref 3.7–5.3)
SODIUM: 138 meq/L (ref 137–147)

## 2014-03-26 LAB — GLUCOSE, CAPILLARY
Glucose-Capillary: 129 mg/dL — ABNORMAL HIGH (ref 70–99)
Glucose-Capillary: 296 mg/dL — ABNORMAL HIGH (ref 70–99)
Glucose-Capillary: 199 mg/dL — ABNORMAL HIGH (ref 70–99)
Glucose-Capillary: 233 mg/dL — ABNORMAL HIGH (ref 70–99)

## 2014-03-26 MED ORDER — LOSARTAN POTASSIUM 25 MG PO TABS
25.0000 mg | ORAL_TABLET | Freq: Every day | ORAL | Status: DC
  Filled 2014-03-26: qty 1

## 2014-03-26 MED ORDER — SPIRONOLACTONE 12.5 MG HALF TABLET
12.5000 mg | ORAL_TABLET | Freq: Every day | ORAL | Status: DC
  Administered 2014-03-27: 12.5 mg via ORAL
  Filled 2014-03-26: qty 1

## 2014-03-26 NOTE — Progress Notes (Signed)
    Subjective:  No chest pain or SOB at rest  Objective:  Vital Signs in the last 24 hours: Temp:  [97.7 F (36.5 C)-98.2 F (36.8 C)] 98.2 F (36.8 C) (10/04 0941) Pulse Rate:  [54-64] 62 (10/04 0941) Resp:  [17-18] 18 (10/04 0941) BP: (97-116)/(39-58) 101/58 mmHg (10/04 0954) SpO2:  [98 %-99 %] 99 % (10/04 0941) Weight:  [172 lb 13.5 oz (78.4 kg)] 172 lb 13.5 oz (78.4 kg) (10/04 0507)  Intake/Output from previous day:  Intake/Output Summary (Last 24 hours) at 03/26/14 1112 Last data filed at 03/26/14 1050  Gross per 24 hour  Intake    760 ml  Output   1350 ml  Net   -590 ml    Physical Exam: General appearance: alert, cooperative and no distress Lungs: scattered rhonchi Heart: regular rate and rhythm   Rate: 62  Rhythm: not on tele  Lab Results:  Recent Labs  03/25/14 0140  WBC 5.0  HGB 9.8*  PLT 242    Recent Labs  03/25/14 0140 03/26/14 0507  NA 142 138  K 4.2 4.0  CL 106 102  CO2 27 28  GLUCOSE 147* 148*  BUN 12 9  CREATININE 0.39* 0.40*   No results found for this basename: TROPONINI, CK, MB,  in the last 72 hours No results found for this basename: INR,  in the last 72 hours  Imaging: Imaging results have been reviewed  Cardiac Studies:  Assessment/Plan:   Principal Problem:   Sepsis Active Problems:   NSTEMI (non-ST elevated myocardial infarction)   CAD- S/P RCA PCI '00, LAD (x3) and CFX DES 03/22/14   PVD- s/p RFPBPG April 2015   Left leg pain- Left inguinal hematoma on CT   Diabetes mellitus, controlled   Acute respiratory failure   Acute systolic heart failure   ICM- EF 35-40% by echo 03/08/14   Anemia    PLAN:  Meds held this am secondary to low B/P. Consider decreasing Cozaar to 25 mg daily and changing time to Q HS, also decreasing Aldactone to 12.5 mg. Will discuss with MD.   Corine ShelterLuke Gerica Koble PA-C [facility] [phone] 03/26/2014, 11:12 AM

## 2014-03-26 NOTE — Progress Notes (Signed)
[facility] PROGRESS NOTE Interim History: 58 yo female with history of peripheral vascular disease who had right lower extremity runoff 03/01/2014 presented to the ER 9/13 because of worsening left lower extremity pain over 2 days. In the ER patient was found to be hypotensive and febrile,develop MSSA positive blood cultures and also Pseudomonas was cultured from her groin wound, developed  aspiration pneumonia, She had severe LV dysfunction on her echocardiogram as well as a cardiac cath noting multivessel CAD, but the vessels were poor targets for grafting, patient was intubated for 10 days and extubated. She had evidence of persistent pleural effusions from heart failure. With her sepsis, heart failure, ischemic cardiomyopathy with poor targets, and general deconditioning, malnutrition, and peripheral vascular disease she would not be candidate for CABG, After medical stabilization, she underwent PCI of LAD x 3 DES and LCX x 1 DES on 9/30. The HF team has continued to attend to her volume management/diuresis. Her post extubation dysphagia has slowly improved, and she was able to have her NG tube removed on 10/1, with advancement of diet.    Assessment/Plan: Acute hypoxic resp failure due to ischemia and flash edema: - Resolved - extubated. - Awaiting CIR. Cont to work with PT/OT.  Bilateral effusions L >R > transudate: - 500cc removed 9/23 from left - no respiratory distress at this time.  NSTEMI with severe three-vessel CAD - not a CABG candidate: - Per Cardiology - s/p complex PCI of multiple lesions - will need lifelong dual antiplatelet therapy with aspirin and clopidogrel.  Ischemic cardiomyopathy / Acute on chronic systolic congestive heart failure with an EF of 10-15%: - Per CHF Team  - slightly positive. Was given 500cc and holding lasix until Sunday, then start lasix 40 daily.  Dysphagia: - Felt to be due to deconditioning + damage/irritation related to intubation/ET tube  - passed a swallow study 10/1 > started on dysphagia 1 diet, honey thick liquids - discontinued PANDA tube - tolerating well thus far   Septic shock:  - resolved, due to MSSA  MSSA bacteremia 2/2 blood cx: - TEE negative, source unknown. - cultures now clear - cefazolin 3 weeks total per ID (stop date 03/25/2014)  - repeat blood cx 9/22 remarkable for 1/2 coag negative Staph likely contaminant  Pseudomonas R groin wound:  abx course as noted above   Tobacco abuse:  COPD w/ acute exacerbation  Acute exacerbation has resolved - stable at this time   Hypophosphatemia  Resolved.  Code Status: FULL  Family Communication: no family present at time of exam  Disposition Plan: Awaiting CIR.    Consultants: CHF Team  TCTS  PCCM > [facility]  Cardiology    Procedures: Significant Events:  9/13 PM - admitted  9/14 hypoxia, hypotension, +Trop I - intubated  9/15 pos BC MSSA  9/16 TEE > see below  9/17 neg 2 liters, still bronchospastic  9/19 extubated  9/21 acute hypoxemic respiratory failure, re-intubated, Cardiology started milrinone  9/23 thora left side 500c removed, extubated  9/24 failed swallow evaluation; TCTS consult > not a CABG candidate, rec medical management, transfused 1 U PRBC  9/26 [facility] assumed care  9/30 PCI of LAD x 3 DES and LCX x 1 DES  10/1 passed MBS - NG removed - diet begun  10/2 transferred to tele bed  9/14 Echo- EF 30-35%  9/16 TEE- no LAA thrombus, no valvular vegetation, anterior, anteroseptal, apical, and inferioapical hypokinesis, EF 35%  9/17 EKG- sinus tach, low voltage QRS, cannot  r/o anteroseptal infarct  9/18 LHC- 40% distal left main, severe diffuse disease from LAD, 95% stenosis circ, focal 75% mid RCA, LVEF 15%  9/21 L groin vascular ultrasound >>> no evidence of complication around graft site  9/29 - cardiac cath - PTCA and stenting of the left circumflex, PTCA and stenting of the proximal, mid, and distal-LAD   Antibiotics: Vanc 9/14 >  9/17 + 9/22 > 9/25  Cefepime 9/14 > 9/23  Ancef 9/21 + 9/25 >  Doxycycline 9/24 > 9/28   HPI/Subjective: No complains  Objective: Filed Vitals:   03/25/14 0449 03/25/14 1415 03/25/14 2113 03/26/14 0507  BP: 136/62 100/39 104/50 116/56  Pulse: 71 64 56 54  Temp: 97.8 F (36.6 C) 98.2 F (36.8 C) 97.8 F (36.6 C) 97.7 F (36.5 C)  TempSrc: Oral Oral Oral Oral  Resp: 18  17 18   Height:      Weight: 80.1 kg (176 lb 9.4 oz)   78.4 kg (172 lb 13.5 oz)  SpO2: 100% 99% 99% 98%    Intake/Output Summary (Last 24 hours) at 03/26/14 0755 Last data filed at 03/26/14 0700  Gross per 24 hour  Intake   1000 ml  Output   1350 ml  Net   -350 ml   Filed Weights   03/24/14 1747 03/25/14 0449 03/26/14 0507  Weight: 80.2 kg (176 lb 12.9 oz) 80.1 kg (176 lb 9.4 oz) 78.4 kg (172 lb 13.5 oz)    Exam:  General: Alert, awake, oriented x3, in no acute distress.  HEENT: No bruits, no goiter.  Heart: Regular rate and rhythm. Lungs: Good air movement, clear Abdomen: Soft, nontender, nondistended, positive bowel sounds.  Neuro: Grossly intact, nonfocal.   Data Reviewed: Basic Metabolic Panel:  Recent Labs Lab 03/22/14 0500 03/23/14 0400 03/24/14 0430 03/25/14 0140 03/26/14 0507  NA 143 143 141 142 138  K 3.8 3.7 3.4* 4.2 4.0  CL 103 104 103 106 102  CO2 29 30 28 27 28   GLUCOSE 132* 178* 100* 147* 148*  BUN 18 17 19 12 9   CREATININE 0.36* 0.39* 0.43* 0.39* 0.40*  CALCIUM 8.5 8.6 8.5 8.4 8.7  MG 2.1  --   --   --   --   PHOS 3.9  --   --   --   --    Liver Function Tests:  Recent Labs Lab 03/20/14 0500 03/22/14 0500  AST 17 22  ALT 16 13  ALKPHOS 47 54  BILITOT 0.3 0.4  PROT 5.5* 6.1  ALBUMIN 2.3* 2.4*   No results found for this basename: LIPASE, AMYLASE,  in the last 168 hours No results found for this basename: AMMONIA,  in the last 168 hours CBC:  Recent Labs Lab 03/22/14 0500 03/23/14 0400 03/25/14 0140  WBC 5.7 5.4 5.0  NEUTROABS 3.4  --   --   HGB  10.7* 10.3* 9.8*  HCT 34.4* 33.9* 31.4*  MCV 77.7* 75.8* 76.2*  PLT 256 266 242   Cardiac Enzymes: No results found for this basename: CKTOTAL, CKMB, CKMBINDEX, TROPONINI,  in the last 168 hours BNP (last 3 results)  Recent Labs  03/06/14 1015 03/19/14 0630  PROBNP 8842.0* 4761.0*   CBG:  Recent Labs Lab 03/25/14 0648 03/25/14 1024 03/25/14 1628 03/25/14 2117 03/26/14 0622  GLUCAP 143* 248* 160* 176* 129*    Recent Results (from the past 240 hour(s))  MRSA PCR SCREENING     Status: None   Collection Time    03/22/14  4:35 PM      Result Value Ref Range Status   MRSA by PCR NEGATIVE  NEGATIVE Final   Comment:            The GeneXpert MRSA Assay (FDA     approved for NASAL specimens     only), is one component of a     comprehensive MRSA colonization     surveillance program. It is not     intended to diagnose MRSA     infection nor to guide or     monitor treatment for     MRSA infections.     Studies: No results found.  Scheduled Meds: . alteplase  2 mg Intracatheter Once  . aspirin  81 mg Oral Daily  . atorvastatin  80 mg Oral q1800  . bisoprolol  5 mg Oral Daily  . clopidogrel  75 mg Oral Daily  . escitalopram  10 mg Oral Daily  . furosemide  40 mg Oral Daily  . gabapentin  300 mg Oral TID  . heparin subcutaneous  5,000 Units Subcutaneous 3 times per day  . insulin aspart  0-20 Units Subcutaneous TID WC  . insulin glargine  20 Units Subcutaneous Daily  . losartan  50 mg Oral Daily  . nitroGLYCERIN  0.4 mg Transdermal Q24H  . predniSONE  10 mg Oral Q breakfast  . sodium chloride  10-40 mL Intracatheter Q12H  . spironolactone  25 mg Oral Daily   Continuous Infusions: . sodium chloride 10 mL/hr at 03/24/14 0201     Marinda Elk  [facility] Pager [phone]. If 8PM-8AM, please contact night-coverage at [email or web address], password [facility] 03/26/2014, 7:55 AM  LOS: 21 days

## 2014-03-26 NOTE — Progress Notes (Signed)
Patient's BP continues to read low- 91/43 automatic, 86/58 manual.  Patient is not symptomatic.  Dr. Radonna RickerFeliz made aware. No orders given.

## 2014-03-26 NOTE — Progress Notes (Signed)
Dr. Radonna RickerFeliz made aware of low BP of 99/45 and 97/41 automatic and 101/58 manually this a.m.  Orders received to hold aldactone and lasix dose this a.m.

## 2014-03-26 NOTE — Progress Notes (Signed)
Pt. Seen and examined. Agree with the NP/PA-C note as written.  Agree with reducing doses of her cozaar and aldactone due to hypotension. Awaiting CIR evaluation, probably tomorrow.  Chrystie NoseKenneth C. Hilty, MD, [facility] HospitalFACC Attending Cardiologist [facility]

## 2014-03-27 ENCOUNTER — Encounter (HOSPITAL_COMMUNITY): Payer: Self-pay | Admitting: *Deleted

## 2014-03-27 ENCOUNTER — Inpatient Hospital Stay (HOSPITAL_COMMUNITY)
Admission: RE | Admit: 2014-03-27 | Discharge: 2014-04-11 | DRG: 939 | Disposition: A | Discharge status: Home Health | Payer: BC Managed Care – PPO | Source: Intra-hospital | Attending: Physical Medicine & Rehabilitation | Admitting: Physical Medicine & Rehabilitation

## 2014-03-27 DIAGNOSIS — E1140 Type 2 diabetes mellitus with diabetic neuropathy, unspecified: Secondary | ICD-10-CM

## 2014-03-27 DIAGNOSIS — M25572 Pain in left ankle and joints of left foot: Secondary | ICD-10-CM

## 2014-03-27 DIAGNOSIS — I2510 Atherosclerotic heart disease of native coronary artery without angina pectoris: Secondary | ICD-10-CM

## 2014-03-27 DIAGNOSIS — L97419 Non-pressure chronic ulcer of right heel and midfoot with unspecified severity: Secondary | ICD-10-CM

## 2014-03-27 DIAGNOSIS — M86169 Other acute osteomyelitis, unspecified tibia and fibula: Secondary | ICD-10-CM

## 2014-03-27 DIAGNOSIS — M79605 Pain in left leg: Secondary | ICD-10-CM

## 2014-03-27 DIAGNOSIS — L0291 Cutaneous abscess, unspecified: Secondary | ICD-10-CM

## 2014-03-27 DIAGNOSIS — Z79899 Other long term (current) drug therapy: Secondary | ICD-10-CM | POA: Diagnosis not present

## 2014-03-27 DIAGNOSIS — J9690 Respiratory failure, unspecified, unspecified whether with hypoxia or hypercapnia: Secondary | ICD-10-CM | POA: Diagnosis not present

## 2014-03-27 DIAGNOSIS — Z7982 Long term (current) use of aspirin: Secondary | ICD-10-CM

## 2014-03-27 DIAGNOSIS — Z955 Presence of coronary angioplasty implant and graft: Secondary | ICD-10-CM | POA: Diagnosis not present

## 2014-03-27 DIAGNOSIS — I951 Orthostatic hypotension: Secondary | ICD-10-CM

## 2014-03-27 DIAGNOSIS — I255 Ischemic cardiomyopathy: Secondary | ICD-10-CM

## 2014-03-27 DIAGNOSIS — E43 Unspecified severe protein-calorie malnutrition: Secondary | ICD-10-CM | POA: Diagnosis not present

## 2014-03-27 DIAGNOSIS — T84623A Infection and inflammatory reaction due to internal fixation device of left tibia, initial encounter: Secondary | ICD-10-CM

## 2014-03-27 DIAGNOSIS — E119 Type 2 diabetes mellitus without complications: Secondary | ICD-10-CM

## 2014-03-27 DIAGNOSIS — L02416 Cutaneous abscess of left lower limb: Secondary | ICD-10-CM

## 2014-03-27 DIAGNOSIS — R5381 Other malaise: Secondary | ICD-10-CM | POA: Diagnosis present

## 2014-03-27 DIAGNOSIS — E669 Obesity, unspecified: Secondary | ICD-10-CM | POA: Diagnosis not present

## 2014-03-27 DIAGNOSIS — E11628 Type 2 diabetes mellitus with other skin complications: Secondary | ICD-10-CM

## 2014-03-27 DIAGNOSIS — Z89421 Acquired absence of other right toe(s): Secondary | ICD-10-CM | POA: Diagnosis not present

## 2014-03-27 DIAGNOSIS — L089 Local infection of the skin and subcutaneous tissue, unspecified: Secondary | ICD-10-CM

## 2014-03-27 DIAGNOSIS — L97909 Non-pressure chronic ulcer of unspecified part of unspecified lower leg with unspecified severity: Secondary | ICD-10-CM | POA: Diagnosis present

## 2014-03-27 DIAGNOSIS — L24A9 Irritant contact dermatitis due friction or contact with other specified body fluids: Secondary | ICD-10-CM

## 2014-03-27 DIAGNOSIS — F17210 Nicotine dependence, cigarettes, uncomplicated: Secondary | ICD-10-CM

## 2014-03-27 DIAGNOSIS — T148XXA Other injury of unspecified body region, initial encounter: Secondary | ICD-10-CM

## 2014-03-27 DIAGNOSIS — I739 Peripheral vascular disease, unspecified: Secondary | ICD-10-CM

## 2014-03-27 DIAGNOSIS — I878 Other specified disorders of veins: Secondary | ICD-10-CM

## 2014-03-27 DIAGNOSIS — I10 Essential (primary) hypertension: Secondary | ICD-10-CM | POA: Diagnosis not present

## 2014-03-27 DIAGNOSIS — A419 Sepsis, unspecified organism: Secondary | ICD-10-CM

## 2014-03-27 DIAGNOSIS — D62 Acute posthemorrhagic anemia: Secondary | ICD-10-CM | POA: Diagnosis not present

## 2014-03-27 DIAGNOSIS — R1313 Dysphagia, pharyngeal phase: Secondary | ICD-10-CM | POA: Diagnosis not present

## 2014-03-27 DIAGNOSIS — D649 Anemia, unspecified: Secondary | ICD-10-CM | POA: Diagnosis present

## 2014-03-27 DIAGNOSIS — E785 Hyperlipidemia, unspecified: Secondary | ICD-10-CM

## 2014-03-27 DIAGNOSIS — B372 Candidiasis of skin and nail: Secondary | ICD-10-CM | POA: Diagnosis not present

## 2014-03-27 DIAGNOSIS — Y831 Surgical operation with implant of artificial internal device as the cause of abnormal reaction of the patient, or of later complication, without mention of misadventure at the time of the procedure: Secondary | ICD-10-CM | POA: Diagnosis not present

## 2014-03-27 DIAGNOSIS — I214 Non-ST elevation (NSTEMI) myocardial infarction: Secondary | ICD-10-CM

## 2014-03-27 DIAGNOSIS — I5023 Acute on chronic systolic (congestive) heart failure: Secondary | ICD-10-CM | POA: Diagnosis not present

## 2014-03-27 DIAGNOSIS — L538 Other specified erythematous conditions: Secondary | ICD-10-CM | POA: Diagnosis not present

## 2014-03-27 DIAGNOSIS — E1142 Type 2 diabetes mellitus with diabetic polyneuropathy: Secondary | ICD-10-CM

## 2014-03-27 DIAGNOSIS — M25472 Effusion, left ankle: Secondary | ICD-10-CM | POA: Diagnosis not present

## 2014-03-27 DIAGNOSIS — Z9861 Coronary angioplasty status: Secondary | ICD-10-CM

## 2014-03-27 DIAGNOSIS — J449 Chronic obstructive pulmonary disease, unspecified: Secondary | ICD-10-CM

## 2014-03-27 DIAGNOSIS — L0390 Cellulitis, unspecified: Secondary | ICD-10-CM

## 2014-03-27 LAB — GLUCOSE, CAPILLARY
GLUCOSE-CAPILLARY: 153 mg/dL — AB (ref 70–99)
GLUCOSE-CAPILLARY: 153 mg/dL — AB (ref 70–99)
GLUCOSE-CAPILLARY: 205 mg/dL — AB (ref 70–99)
GLUCOSE-CAPILLARY: 187 mg/dL — AB (ref 70–99)
Glucose-Capillary: 203 mg/dL — ABNORMAL HIGH (ref 70–99)
Glucose-Capillary: 242 mg/dL — ABNORMAL HIGH (ref 70–99)

## 2014-03-27 LAB — BASIC METABOLIC PANEL
Anion gap: 9 (ref 5–15)
BUN: 11 mg/dL (ref 6–23)
CALCIUM: 8.6 mg/dL (ref 8.4–10.5)
CO2: 27 meq/L (ref 19–32)
CREATININE: 0.4 mg/dL — AB (ref 0.50–1.10)
Chloride: 105 mEq/L (ref 96–112)
GFR calc Af Amer: >90 mL/min (ref >90)
GLUCOSE: 146 mg/dL — AB (ref 70–99)
Potassium: 4 mEq/L (ref 3.7–5.3)
Sodium: 141 mEq/L (ref 137–147)

## 2014-03-27 MED ORDER — LOSARTAN POTASSIUM 25 MG PO TABS
25.0000 mg | ORAL_TABLET | Freq: Every day | ORAL | Status: DC
  Administered 2014-03-27 – 2014-04-10 (×15): 25 mg via ORAL
  Filled 2014-03-27 (×16): qty 1

## 2014-03-27 MED ORDER — SPIRONOLACTONE 12.5 MG HALF TABLET: 12.5000 mg | ORAL_TABLET | Freq: Every day | ORAL | Status: DC

## 2014-03-27 MED ORDER — OXYCODONE HCL 5 MG PO TABS
10.0000 mg | ORAL_TABLET | ORAL | Status: DC | PRN
  Administered 2014-03-27 – 2014-04-10 (×46): 10 mg via ORAL
  Filled 2014-03-27 (×48): qty 2

## 2014-03-27 MED ORDER — ALUM & MAG HYDROXIDE-SIMETH 200-200-20 MG/5ML PO SUSP: 30.0000 mL | ORAL | Status: DC | PRN

## 2014-03-27 MED ORDER — ESCITALOPRAM OXALATE 10 MG PO TABS
10.0000 mg | ORAL_TABLET | Freq: Every day | ORAL | Status: DC
  Administered 2014-03-28 – 2014-04-11 (×15): 10 mg via ORAL
  Filled 2014-03-27 (×16): qty 1

## 2014-03-27 MED ORDER — SPIRONOLACTONE 12.5 MG HALF TABLET
12.5000 mg | ORAL_TABLET | Freq: Every day | ORAL | Status: DC
  Administered 2014-03-28 – 2014-04-11 (×15): 12.5 mg via ORAL
  Filled 2014-03-27 (×17): qty 1

## 2014-03-27 MED ORDER — MUSCLE RUB 10-15 % EX CREA
TOPICAL_CREAM | Freq: Three times a day (TID) | CUTANEOUS | Status: DC
  Administered 2014-03-27 – 2014-04-06 (×19): via TOPICAL
  Administered 2014-04-07: 1 via TOPICAL
  Administered 2014-04-07 – 2014-04-11 (×7): via TOPICAL
  Filled 2014-03-27: qty 85

## 2014-03-27 MED ORDER — INSULIN GLARGINE 100 UNIT/ML ~~LOC~~ SOLN: 20.0000 [IU] | Freq: Every day | SUBCUTANEOUS | Status: DC

## 2014-03-27 MED ORDER — TRAZODONE HCL 50 MG PO TABS: 25.0000 mg | ORAL_TABLET | Freq: Every evening | ORAL | Status: DC | PRN

## 2014-03-27 MED ORDER — INSULIN ASPART 100 UNIT/ML ~~LOC~~ SOLN
0.0000 [IU] | Freq: Three times a day (TID) | SUBCUTANEOUS | Status: DC
  Administered 2014-03-27: 4 [IU] via SUBCUTANEOUS
  Administered 2014-03-28: 3 [IU] via SUBCUTANEOUS
  Administered 2014-03-28: 7 [IU] via SUBCUTANEOUS
  Administered 2014-03-28: 11 [IU] via SUBCUTANEOUS

## 2014-03-27 MED ORDER — CLOPIDOGREL BISULFATE 75 MG PO TABS: 75.0000 mg | ORAL_TABLET | Freq: Every day | ORAL | Status: DC

## 2014-03-27 MED ORDER — ASPIRIN 81 MG PO CHEW: 81.0000 mg | CHEWABLE_TABLET | Freq: Every day | ORAL | Status: AC

## 2014-03-27 MED ORDER — CLOPIDOGREL BISULFATE 75 MG PO TABS
75.0000 mg | ORAL_TABLET | Freq: Every day | ORAL | Status: DC
  Administered 2014-03-28 – 2014-04-11 (×14): 75 mg via ORAL
  Filled 2014-03-27 (×16): qty 1

## 2014-03-27 MED ORDER — ENSURE COMPLETE PO LIQD: 237.0000 mL | Freq: Two times a day (BID) | ORAL | Status: DC

## 2014-03-27 MED ORDER — ONDANSETRON HCL 4 MG/2ML IJ SOLN: 4.0000 mg | Freq: Four times a day (QID) | INTRAMUSCULAR | Status: DC | PRN

## 2014-03-27 MED ORDER — ATORVASTATIN CALCIUM 80 MG PO TABS
80.0000 mg | ORAL_TABLET | Freq: Every day | ORAL | Status: DC
  Administered 2014-03-28 – 2014-04-10 (×13): 80 mg via ORAL
  Filled 2014-03-27 (×15): qty 1

## 2014-03-27 MED ORDER — NITROGLYCERIN 0.4 MG/HR TD PT24
0.4000 mg | MEDICATED_PATCH | TRANSDERMAL | Status: DC
  Filled 2014-03-27: qty 1

## 2014-03-27 MED ORDER — NYSTATIN 100000 UNIT/GM EX CREA
TOPICAL_CREAM | Freq: Two times a day (BID) | CUTANEOUS | Status: DC
Start: 2014-03-27 — End: 2014-04-11
  Administered 2014-03-27 – 2014-03-30 (×6): via TOPICAL
  Administered 2014-03-31: 1 via TOPICAL
  Administered 2014-03-31 – 2014-04-10 (×17): via TOPICAL
  Administered 2014-04-10: 1 via TOPICAL
  Administered 2014-04-11: 08:00:00 via TOPICAL
  Filled 2014-03-27: qty 15

## 2014-03-27 MED ORDER — GABAPENTIN 300 MG PO CAPS
300.0000 mg | ORAL_CAPSULE | Freq: Three times a day (TID) | ORAL | Status: DC
  Administered 2014-03-27 – 2014-04-09 (×36): 300 mg via ORAL
  Filled 2014-03-27 (×41): qty 1

## 2014-03-27 MED ORDER — ALBUTEROL SULFATE (2.5 MG/3ML) 0.083% IN NEBU: 2.5000 mg | INHALATION_SOLUTION | RESPIRATORY_TRACT | Status: DC | PRN

## 2014-03-27 MED ORDER — ATORVASTATIN CALCIUM 80 MG PO TABS: 80.0000 mg | ORAL_TABLET | Freq: Every day | ORAL | Status: DC

## 2014-03-27 MED ORDER — ACETAMINOPHEN 325 MG PO TABS: 650.0000 mg | ORAL_TABLET | ORAL | Status: DC | PRN

## 2014-03-27 MED ORDER — FLUCONAZOLE 100 MG PO TABS
100.0000 mg | ORAL_TABLET | Freq: Every day | ORAL | Status: AC
  Administered 2014-03-27 – 2014-03-31 (×5): 100 mg via ORAL
  Filled 2014-03-27 (×5): qty 1

## 2014-03-27 MED ORDER — PNEUMOCOCCAL VAC POLYVALENT 25 MCG/0.5ML IJ INJ
0.5000 mL | INJECTION | INTRAMUSCULAR | Status: AC
Start: 2014-03-28 — End: 2014-03-28
  Administered 2014-03-28: 0.5 mL via INTRAMUSCULAR
  Filled 2014-03-27: qty 0.5

## 2014-03-27 MED ORDER — DIPHENHYDRAMINE HCL 12.5 MG/5ML PO ELIX: 12.5000 mg | ORAL_SOLUTION | Freq: Four times a day (QID) | ORAL | Status: DC | PRN

## 2014-03-27 MED ORDER — BISACODYL 10 MG RE SUPP: 10.0000 mg | Freq: Every day | RECTAL | Status: DC | PRN

## 2014-03-27 MED ORDER — ENSURE COMPLETE PO LIQD
237.0000 mL | Freq: Two times a day (BID) | ORAL | Status: DC
  Administered 2014-03-27: 237 mL via ORAL

## 2014-03-27 MED ORDER — BISOPROLOL FUMARATE 5 MG PO TABS
5.0000 mg | ORAL_TABLET | Freq: Every day | ORAL | Status: DC
  Administered 2014-03-28 – 2014-04-11 (×15): 5 mg via ORAL
  Filled 2014-03-27 (×16): qty 1

## 2014-03-27 MED ORDER — NITROGLYCERIN 0.4 MG/HR TD PT24
0.4000 mg | MEDICATED_PATCH | TRANSDERMAL | Status: DC
  Administered 2014-03-27 – 2014-04-10 (×15): 0.4 mg via TRANSDERMAL
  Filled 2014-03-27 (×16): qty 1

## 2014-03-27 MED ORDER — LOSARTAN POTASSIUM 25 MG PO TABS: 25.0000 mg | ORAL_TABLET | Freq: Every day | ORAL | Status: DC

## 2014-03-27 MED ORDER — PREDNISONE 5 MG PO TABS
5.0000 mg | ORAL_TABLET | Freq: Every day | ORAL | Status: AC
  Administered 2014-03-28 – 2014-03-30 (×3): 5 mg via ORAL
  Filled 2014-03-27 (×3): qty 1

## 2014-03-27 MED ORDER — INSULIN GLARGINE 100 UNIT/ML ~~LOC~~ SOLN
20.0000 [IU] | Freq: Every day | SUBCUTANEOUS | Status: DC
  Administered 2014-03-28 – 2014-03-29 (×2): 20 [IU] via SUBCUTANEOUS
  Filled 2014-03-27 (×2): qty 0.2

## 2014-03-27 MED ORDER — FLEET ENEMA 7-19 GM/118ML RE ENEM: 1.0000 | ENEMA | Freq: Once | RECTAL | Status: AC | PRN

## 2014-03-27 MED ORDER — RESOURCE THICKENUP CLEAR PO POWD
ORAL | Status: DC | PRN
  Filled 2014-03-27: qty 125

## 2014-03-27 MED ORDER — GUAIFENESIN-DM 100-10 MG/5ML PO SYRP: 5.0000 mL | ORAL_SOLUTION | Freq: Four times a day (QID) | ORAL | Status: DC | PRN

## 2014-03-27 MED ORDER — ASPIRIN 81 MG PO CHEW
81.0000 mg | CHEWABLE_TABLET | Freq: Every day | ORAL | Status: DC
  Administered 2014-03-28 – 2014-04-11 (×14): 81 mg via ORAL
  Filled 2014-03-27 (×16): qty 1

## 2014-03-27 MED ORDER — BISOPROLOL FUMARATE 5 MG PO TABS: 5.0000 mg | ORAL_TABLET | Freq: Every day | ORAL | Status: DC

## 2014-03-27 MED ORDER — FUROSEMIDE 40 MG PO TABS
40.0000 mg | ORAL_TABLET | Freq: Every day | ORAL | Status: DC
  Administered 2014-03-28 – 2014-04-11 (×15): 40 mg via ORAL
  Filled 2014-03-27 (×16): qty 1

## 2014-03-27 MED ORDER — TRAMADOL HCL 50 MG PO TABS
50.0000 mg | ORAL_TABLET | Freq: Four times a day (QID) | ORAL | Status: DC | PRN
  Administered 2014-04-01 – 2014-04-11 (×9): 50 mg via ORAL
  Filled 2014-03-27 (×9): qty 1

## 2014-03-27 MED ORDER — FUROSEMIDE 40 MG PO TABS: 40.0000 mg | ORAL_TABLET | Freq: Every day | ORAL | Status: DC

## 2014-03-27 MED ORDER — TROLAMINE SALICYLATE 10 % EX CREA
TOPICAL_CREAM | Freq: Three times a day (TID) | CUTANEOUS | Status: DC
  Filled 2014-03-27: qty 85

## 2014-03-27 MED ORDER — NYSTATIN 100000 UNIT/GM EX POWD
Freq: Three times a day (TID) | CUTANEOUS | Status: DC
  Administered 2014-03-27 – 2014-04-11 (×34): via TOPICAL
  Filled 2014-03-27: qty 15

## 2014-03-27 MED ORDER — ENOXAPARIN SODIUM 30 MG/0.3ML ~~LOC~~ SOLN
30.0000 mg | SUBCUTANEOUS | Status: DC
  Administered 2014-03-27 – 2014-04-06 (×11): 30 mg via SUBCUTANEOUS
  Filled 2014-03-27 (×13): qty 0.3

## 2014-03-27 MED ORDER — ONDANSETRON HCL 4 MG PO TABS: 4.0000 mg | ORAL_TABLET | Freq: Four times a day (QID) | ORAL | Status: DC | PRN

## 2014-03-27 NOTE — Progress Notes (Signed)
NUTRITION FOLLOW UP  Intervention:   - Ensure Complete po BID, each supplement provides 350 kcal and 13 grams of protein. Please make sure supplements are thickened to appropriate consistency. - RD will continue to monitor.  Nutrition Dx:   Inadequate oral intake related to inability to eat as evidenced by NPO status, improving.  Goal:   Intake to meet >90% of estimated nutrition needs. Unmet.  Monitor:   Weight trend, po intake, labs, swallowing function and ability to advance diet  Assessment:   58 yo smoker, PVD, CAD, recent (9-10) failed left femoral attempted stenosis angioplasty who presented with left leg pain 9/13 and found to be hypotensive, hypoxic and + troponin. She has a rt fem open wound that has been healng x 6 weeks and is followed by [facility]. Ct abd reveals large left femoral hematoma.  - Patient intubated from 9/14-9/19 and 9/21-9/23. - Diet advanced to Dysphagia I with honey thickened liquids 10/1 per SLP recommendations.  - NG tube removed 10/1. - Pt tolerating diet with meal completion varied from 25-100%. She says that she is eating mashed potatoes, juice, grits, oatmeal, yogurt, Magic Cups, and applesauce.  - She reports that she has had an appetite.   Labs: CBGs: 153-205 Na, K and BUN WNL  Height: Ht Readings from Last 1 Encounters:  03/24/14 5\' 2"  (1.575 m)    Weight Status:   Wt Readings from Last 1 Encounters:  03/27/14 175 lb 14.8 oz (79.8 kg)    Re-estimated needs:  Kcal: 1600-1800 Protein: 100-110 g Fluid: 1.8 L/day  Skin: diabetic ulcer on left and right great toes, diabetic ulcer on right ankle  Diet Order: Dysphagia   Intake/Output Summary (Last 24 hours) at 03/27/14 1117 Last data filed at 03/27/14 1000  Gross per 24 hour  Intake    710 ml  Output    700 ml  Net     10 ml    Last BM: 10/4   Labs:   Recent Labs Lab 03/22/14 0500  03/25/14 0140 03/26/14 0507 03/27/14 0500  NA 143  < > 142 138 141  K 3.8  < > 4.2 4.0 4.0   CL 103  < > 106 102 105  CO2 29  < > 27 28 27   BUN 18  < > 12 9 11   CREATININE 0.36*  < > 0.39* 0.40* 0.40*  CALCIUM 8.5  < > 8.4 8.7 8.6  MG 2.1  --   --   --   --   PHOS 3.9  --   --   --   --   GLUCOSE 132*  < > 147* 148* 146*  < > = values in this interval not displayed.  CBG (last 3)   Recent Labs  03/27/14 0441 03/27/14 0738 03/27/14 1054  GLUCAP 153* 153* 205*    Scheduled Meds: . aspirin  81 mg Oral Daily  . atorvastatin  80 mg Oral q1800  . bisoprolol  5 mg Oral Daily  . clopidogrel  75 mg Oral Daily  . escitalopram  10 mg Oral Daily  . furosemide  40 mg Oral Daily  . gabapentin  300 mg Oral TID  . heparin subcutaneous  5,000 Units Subcutaneous 3 times per day  . insulin aspart  0-20 Units Subcutaneous TID WC  . insulin glargine  20 Units Subcutaneous Daily  . losartan  25 mg Oral QHS  . nitroGLYCERIN  0.4 mg Transdermal Q24H  . predniSONE  10 mg Oral Q  breakfast  . sodium chloride  10-40 mL Intracatheter Q12H  . spironolactone  12.5 mg Oral Daily    Continuous Infusions: . sodium chloride 10 mL/hr at 03/24/14 0201    [name] RD, LDN

## 2014-03-27 NOTE — Progress Notes (Signed)
[facility]  All questions answered.

## 2014-03-27 NOTE — Progress Notes (Signed)
Received pt. As a transfer from 3 east.Pt. Was oriented to the unit and therapies routine.An skin assessment was done with Pam Love PAC,pt. Has an extended area with redness and excoriation on her buttocks,scrum and between both legs.Some peeling was noticed on some areas,order to apply antifungal power were received.Pt. Also has a necrotic wound on her posterior Rt. Great toe ,open to air:a necrotic ulcer on her Lt. Great toe,laterally,and a diabetic ulcer on Rt. 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 Closely and assessing her needs.

## 2014-03-27 NOTE — Discharge Summary (Signed)
Physician Discharge Summary  [name] UJW:[id number] DOB: [birth year] DOA: 03/05/2014  PCP: Kirk Ruths, MD  Admit date: 03/05/2014 Discharge date: 03/27/2014  Time spent: 35 minutes  Recommendations for Outpatient Follow-up:  1. Follow up with PCP  Discharge Diagnoses:  Principal Problem:   Sepsis Active Problems:   PVD- s/p RFPBPG April 2015   Left leg pain- Left inguinal hematoma on CT   Anemia   Diabetes mellitus, controlled   NSTEMI (non-ST elevated myocardial infarction)   Acute respiratory failure   Acute systolic heart failure   CAD- S/P RCA PCI '00, LAD (x3) and CFX DES 03/22/14   ICM- EF 35-40% by echo 03/08/14   Discharge Condition: stable  Diet recommendation: low sodium, heart healthy diet  Filed Weights   03/25/14 0449 03/26/14 0507 03/27/14 0545  Weight: 80.1 kg (176 lb 9.4 oz) 78.4 kg (172 lb 13.5 oz) 79.8 kg (175 lb 14.8 oz)    History of present illness:  57 y.o. female with history of peripheral vascular disease who has had a recent right lower extremity runoff presented to the ER because of worsening left lower extremity pain over the last 2 days. In the ER patient was seen by vascular surgeon Dr. Imogene Burn who at this time from the patient's left lower extremity pain was vascular in origin and can be seen as outpatient. In the ER patient was found to be hypotensive and febrile. Blood cultures were obtained along with urine cultures. Chest x-ray and UA were unremarkable. On exam patient had mild discharge from the right groin wound which patient states has been there for last 6 weeks. Patient also has mild skin discoloration in the left groin area where she had catheterization and was weak. Patient states she has been having some abdominal discomfort for over the last 2 months mostly in the epigastric area. Patient has been admitted for sepsis source not clear.    Hospital Course:  Acute hypoxic resp failure due to ischemia and flash edema:  - Due to  acute ischemia. - intubated-> extubated  For 10 days.  Bilateral effusions L >R > transudate:  - 500cc removed 9/23 from left - no respiratory distress at this time.   NSTEMI with severe three-vessel CAD - not a CABG candidate: - She had severe LV dysfunction on her echocardiogram as well as a cardiac cath noting multivessel CAD, but the vessels were poor targets for grafting. The patient was intubated for 10 days and extubated. She had evidence of persistent pleural effusions from heart failure, which lead to thoracocentesis. - Per Cardiology - S/p complex PCI of multiple lesions - will need lifelong dual antiplatelet therapy with aspirin and clopidogrel.   Ischemic cardiomyopathy / Acute on chronic systolic congestive heart failure with an EF of 10-15%:  - Was aggressively diuresed. Consulted HF team. - will go home on Bisoprolol, lasix 40 daily, losartan 25mg  and aldactone 12.5 mg  Dysphagia:  - Felt to be due to deconditioning + damage/irritation related to intubation/ET tube - passed a swallow study 10/1 > started on dysphagia 1 diet, honey thick liquids - discontinued PANDA tube - tolerating well thus far, advance as tolerate it.  Septic shock:  - resolved, due to MSSA.  MSSA bacteremia 2/2 blood cx:  - BCx2 9/14>>MSSA. - TEE negative, source unknown.  - Started on empiric Vanc and cefepime. - Cultures now clear - cefazolin 3 weeks total per ID (stop date 03/25/2014)  - Repeat blood cx 9/22 unremarkable.  Pseudomonas R groin  wound:  - Abx course completed her course.  Tobacco abuse:   COPD w/ acute exacerbation  Acute exacerbation has resolved - stable at this time   Hypophosphatemia  Resolved.   Code Status: FULL    Consultants:  CHF Team  TCTS  PCCM > [facility]  Cardiology   Procedures:  Significant Events:  9/13 PM - admitted  9/14 hypoxia, hypotension, +Trop I - intubated  9/15 pos BC MSSA  9/16 TEE > see below  9/17 neg 2 liters, still bronchospastic  9/19  extubated  9/21 acute hypoxemic respiratory failure, re-intubated, Cardiology started milrinone  9/23 thora left side 500c removed, extubated  9/24 failed swallow evaluation; TCTS consult > not a CABG candidate, rec medical management, transfused 1 U PRBC  9/26 TRH assumed care  9/30 PCI of LAD x 3 DES and LCX x 1 DES  10/1 passed MBS - NG removed - diet begun  10/2 transferred to tele bed  9/14 Echo- EF 30-35%  9/16 TEE- no LAA thrombus, no valvular vegetation, anterior, anteroseptal, apical, and inferioapical hypokinesis, EF 35%  9/17 EKG- sinus tach, low voltage QRS, cannot r/o anteroseptal infarct  9/18 LHC- 40% distal left main, severe diffuse disease from LAD, 95% stenosis circ, focal 75% mid RCA, LVEF 15%  9/21 L groin vascular ultrasound >>> no evidence of complication around graft site  9/29 - cardiac cath - PTCA and stenting of the left circumflex, PTCA and stenting of the proximal, mid, and distal-LAD   Antibiotics:  Vanc 9/14 > 9/17 + 9/22 > 9/25  Cefepime 9/14 > 9/23  Ancef 9/21 + 9/25 >  Doxycycline 9/24 > 9/28      Discharge Exam: Filed Vitals:   03/27/14 0545  BP: 97/47  Pulse: 64  Temp: 98 F (36.7 C)  Resp: 16    Discharge Instructions You were cared for by a hospitalist during your hospital stay. If you have any questions about your discharge medications or the care you received while you were in the hospital after you are discharged, you can call the unit and asked to speak with the hospitalist on call if the hospitalist that took care of you is not available. Once you are discharged, your primary care physician will handle any further medical issues. Please note that NO REFILLS for any discharge medications will be authorized once you are discharged, as it is imperative that you return to your primary care physician (or establish a relationship with a primary care physician if you do not have one) for your aftercare needs so that they can reassess your need for  medications and monitor your lab values.  Discharge Instructions   Diet - low sodium heart healthy    Complete by:  As directed      Increase activity slowly    Complete by:  As directed           Current Discharge Medication List    START taking these medications   Details  aspirin 81 MG chewable tablet Chew 1 tablet (81 mg total) by mouth daily. Qty: 30 tablet, Refills: 0    bisoprolol (ZEBETA) 5 MG tablet Take 1 tablet (5 mg total) by mouth daily. Qty: 30 tablet, Refills: 0    clopidogrel (PLAVIX) 75 MG tablet Take 1 tablet (75 mg total) by mouth daily. Qty: 30 tablet, Refills: 0    furosemide (LASIX) 40 MG tablet Take 1 tablet (40 mg total) by mouth daily. Qty: 30 tablet, Refills: 0  insulin glargine (LANTUS) 100 UNIT/ML injection Inject 0.2 mLs (20 Units total) into the skin daily. Qty: 10 mL, Refills: 11    losartan (COZAAR) 25 MG tablet Take 1 tablet (25 mg total) by mouth at bedtime. Qty: 30 tablet, Refills: 0    spironolactone (ALDACTONE) 12.5 mg TABS tablet Take 0.5 tablets (12.5 mg total) by mouth daily. Qty: 30 tablet, Refills: 0      CONTINUE these medications which have CHANGED   Details  atorvastatin (LIPITOR) 80 MG tablet Take 1 tablet (80 mg total) by mouth daily at 6 PM. Qty: 30 tablet, Refills: 0      CONTINUE these medications which have NOT CHANGED   Details  alprazolam (XANAX) 2 MG tablet Take 1 mg by mouth every 6 (six) hours as needed for sleep or anxiety.     escitalopram (LEXAPRO) 10 MG tablet Take 10 mg by mouth daily.    fentaNYL (DURAGESIC - DOSED MCG/HR) 100 MCG/HR Place 100 mcg onto the skin every 3 (three) days.    fluticasone (FLONASE) 50 MCG/ACT nasal spray Place 1 spray into both nostrils daily as needed for allergies.     gabapentin (NEURONTIN) 600 MG tablet Take 600 mg by mouth 3 (three) times daily.    glimepiride (AMARYL) 2 MG tablet Take 2 mg by mouth 2 (two) times daily.    metFORMIN (GLUCOPHAGE) 500 MG tablet Take  1,000 mg by mouth 2 (two) times daily with a meal.    oxycodone (ROXICODONE) 30 MG immediate release tablet Take 30 mg by mouth every 4 (four) hours as needed for pain.     VESICARE 5 MG tablet Take 5 mg by mouth daily.       STOP taking these medications     aspirin EC 325 MG tablet      hydrochlorothiazide (MICROZIDE) 12.5 MG capsule      meloxicam (MOBIC) 15 MG tablet      quinapril (ACCUPRIL) 20 MG tablet        Allergies  Allergen Reactions  . Penicillins Hives    Tolerates cefepime  . Adhesive [Tape] Rash   Follow-up Information   Follow up with Kirk Ruths, MD. (As needed)    Specialty:  Family Medicine   Contact information:   [address] [place] [address] [phone]       Follow up with Jorge Ny, MD. (as planned)    Specialty:  Vascular Surgery   Contact information:   [address] [place] [address] [phone]        The results of significant diagnostics from this hospitalization (including imaging, microbiology, ancillary and laboratory) are listed below for reference.    Significant Diagnostic Studies: Dg Tibia/fibula Left  03/05/2014   CLINICAL DATA:  Lower leg pain.  EXAM: LEFT TIBIA AND FIBULA - 2 VIEW  COMPARISON:  Screws in the distal tibia and side plate and screws in the distal fibula. The fractures have healed. 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 No acute osseous abnormality. Extensive vascular calcifications and dermal calcifications in the lower leg.  FINDINGS: No acute abnormalities. Arthritis at the knee and ankle. Postsurgical changes.  IMPRESSION: Negative.   Electronically Signed   By: Geanie Cooley M.D.   On: 03/05/2014 22:10   Ct Abdomen Pelvis W Contrast  03/06/2014   CLINICAL DATA:  Left flank pain began after recent vascular catheterization. Pain in the entire left  leg. No fever, chills, nausea, vomiting.  EXAM:  CT ABDOMEN AND PELVIS WITH CONTRAST  TECHNIQUE: Multidetector CT imaging of the abdomen and pelvis was performed using the standard protocol following bolus administration of intravenous contrast.  CONTRAST:  OMNIPAQUE IOHEXOL 300 MG/ML  SOLN  COMPARISON:  07/25/2013  FINDINGS: Lower chest: Dependent changes are seen in the lung bases. Scattered emphysematous changes are also present. There is atherosclerotic calcification of the coronary vessels. Heart size is normal. No pericardial effusion.  Upper abdomen: Scattered calcified granulomata are identified within the liver. No focal abnormality identified within the spleen, pancreas, or left adrenal gland. There is a low-attenuation lesion adjacent to or involving the right adrenal gland, consistent with a benign process. The patient has had previous cholecystectomy. Bilateral renal cysts are noted.  Bowel: Stomach has a normal appearance. A midline abdominal hernia contains mesenteric fat and nondilated loops of small bowel and appears stable over prior studies. Colonic loops are normal in appearance. There is moderate stool burden. The appendix has a normal appearance.  Pelvis: There is moderate stranding in the subcutaneous tissues and abdominal wall of the left inguinal region. Findings are consistent with edema/ hematoma after recent catheterization. Age no evidence for intraperitoneal hematoma. The uterus is present. No adnexal mass. The urinary bladder is distended.  Retroperitoneum: No retroperitoneal or mesenteric adenopathy. There is dense atherosclerotic calcification of the abdominal aorta. No aneurysm. Right iliac stent.  Osseous structures: Degenerative changes in the lower thoracic and upper lumbar spine. There is 4 mm of anterolisthesis of L3 on L4.  IMPRESSION: 1. Left inguinal hematoma. This does not extend into the peritoneal cavity. 2. Midline abdominal hernia contains mesenteric fat and nondilated loops of small bowel. 3. Distended urinary  bladder. 4. Normal appendix. 5. Scattered emphysematous changes. 6. Coronary artery calcifications. 7. Calcified granulomata in the liver. 8. Benign right adrenal gland lesion.   Electronically Signed   By: Rosalie Gums M.D.   On: 03/06/2014 01:07   [place] Extrem Low Right Ltd  03/13/2014   CLINICAL DATA:  Evaluation of bypass graft.  EXAM: ULTRASOUND right LOWER EXTREMITY LIMITED  TECHNIQUE: Ultrasound examination of the lower extremity soft tissues was performed in the area of clinical concern.  COMPARISON:  None.  FINDINGS: No focal fluid collections are identified around the graft in the right lower extremity. Patent arterial blood flow.  IMPRESSION: Negative ultrasound examination for complicating features associated with the right lower extremity bypass graft.   Electronically Signed   By: Loralie Champagne M.D.   On: 03/13/2014 09:29   Dg Chest Port 1 View  03/17/2014   CLINICAL DATA:  Hypoxia.  EXAM: PORTABLE CHEST - 1 VIEW  COMPARISON:  Single view of the chest 03/16/2014 and 03/15/2014.  FINDINGS: Right IJ catheter and feeding tube remain in place. Right greater than left pleural effusions and basilar atelectasis persist without marked change since yesterday's examination. Heart size is normal. No pneumothorax.  IMPRESSION: No marked change in right greater than left effusions and airspace disease since yesterday's examination.   Electronically Signed   By: Drusilla Kanner M.D.   On: 03/17/2014 07:08   Dg Chest Port 1 View  03/16/2014   CLINICAL DATA:  Shortness of breath  EXAM: PORTABLE CHEST - 1 VIEW  COMPARISON:  03/15/2014  FINDINGS: Cardiac shadow remains mildly enlarged. Bilateral pleural effusions are noted and mildly increased from the prior exam. Increasing right basilar atelectasis is noted. A right jugular central line is seen in the SVC  without evidence of pneumothorax. The endotracheal tube and nasogastric catheter been removed in the interval.  IMPRESSION: Mild increase in bilateral  pleural effusions with increase in right basilar atelectasis.   Electronically Signed   By: Alcide Clever M.D.   On: 03/16/2014 07:12   Dg Chest Port 1 View  03/15/2014   CLINICAL DATA:  Status post thoracentesis  EXAM: PORTABLE CHEST - 1 VIEW  COMPARISON:  Study obtained earlier in the day  FINDINGS: There is no appreciable pneumothorax. Left effusion is no longer appreciable. There is a persistent right effusion, stable.  Endotracheal tube tip is 3.8 cm above the carina. Central catheter tip is in the superior vena cava. Nasogastric tube tip and side port are below the diaphragm. Heart is upper normal in size with pulmonary vascularity within normal limits. No adenopathy.  IMPRESSION: Tube and catheter positions as described. Left effusion resolved. Right effusion persists. No pneumothorax. No change in cardiac silhouette.   Electronically Signed   By: Bretta Bang M.D.   On: 03/15/2014 15:14   Dg Chest Port 1 View  03/15/2014   CLINICAL DATA:  Assess endotracheal tube  EXAM: PORTABLE CHEST - 1 VIEW  COMPARISON:  03/14/2014  FINDINGS: Cardiomediastinal silhouette is stable. Endotracheal tube in place with tip 1.9 cm above the carina. Stable right IJ central line position. Stable NG tube position. Persistent mild congestion/ edema. Again noted small bilateral pleural effusion with bilateral basilar atelectasis or infiltrate.  IMPRESSION: Stable support apparatus. Persistent mild congestion/ edema. Again noted small bilateral pleural effusion with bilateral basilar atelectasis or infiltrate.   Electronically Signed   By: Natasha Mead M.D.   On: 03/15/2014 07:47   Dg Chest Port 1 View  03/14/2014   CLINICAL DATA:  Intubation.  EXAM: PORTABLE CHEST - 1 VIEW  COMPARISON:  02/22/2014.  FINDINGS: Endotracheal tube, NG tube, and right IJ line in stable position. Cardiomegaly with pulmonary alveolar infiltrates bilaterally. Bilateral pleural effusions. These findings are consistent with congestive heart failure.  Pulmonary edema has increased from prior exam. No pneumothorax. No acute osseus abnormality. Carotid vascular calcification.  IMPRESSION: 1. Line and tubes are in good anatomic position. 2. Progressive pulmonary edema with small bilateral pleural effusions. 3. Carotid vascular disease.   Electronically Signed   By: Maisie Fus  Register   On: 03/14/2014 07:35   Dg Chest Port 1 View  03/13/2014   CLINICAL DATA:  Central line insertion.  Pulmonary edema.  EXAM: PORTABLE CHEST - 1 VIEW 2:21 p.m.  COMPARISON:  03/13/2014 at 8:45 a.m. and 03/12/2014  FINDINGS: Right jugular vein catheter is been inserted and the tip is in the superior vena cava at the level of the carina in good position. Endotracheal tube the is 3 cm above the carina. NG tube has been advanced and is now in the stomach.  Heart size and pulmonary vascularity are normal. The interstitial edema pattern has improved with slight residual haziness at the lung bases. There is new increased density at both bases consistent with posteriorly layered effusions.  No acute osseous abnormality.  IMPRESSION: New central line in good position.  NG tube now below the diaphragm.  New bilateral effusions.  Improved interstitial edema.   Electronically Signed   By: Geanie Cooley M.D.   On: 03/13/2014 14:36   Dg Chest Port 1 View  03/13/2014   CLINICAL DATA:  Intubation.  EXAM: PORTABLE CHEST - 1 VIEW  COMPARISON:  03/12/2014.  FINDINGS: Endotracheal tube noted 3 cm above the carina. NGT  noted below the hemidiaphragm. Cardiomegaly with pulmonary vascular prominence and interstitial prominence consistent with congestive heart failure and interstitial edema. Bilateral pneumonitis cannot be excluded. No acute bony abnormality.  IMPRESSION: 1. Endotracheal tube noted 3 cm above the carina. NG tube in good anatomic position. 2. Congestive heart failure with pulmonary interstitial edema. Pulmonary interstitial pneumonitis cannot be excluded .   Electronically Signed   By: Maisie Fus   Register   On: 03/13/2014 09:03   Dg Chest Port 1 View  03/12/2014   CLINICAL DATA:  Follow-up pulmonary edema and effusions.  EXAM: PORTABLE CHEST - 1 VIEW  COMPARISON:  Portable chest x-rays yesterday dating back to 03/07/2014.  FINDINGS: Cardiac silhouette mildly enlarged but stable. Interval improvement in the interstitial and airspace pulmonary edema, though mild edema persists. Stable bilateral pleural effusions, right greater than left and dense consolidation in the lower lobes. No new pulmonary parenchymal abnormalities. Left jugular central venous catheter tip projects over the upper SVC, unchanged.  IMPRESSION: Support apparatus satisfactory. Improved CHF, with mild interstitial pulmonary edema persisting. Stable bilateral pleural effusions, right greater than left and associated dense passive atelectasis in the lower lobes. No new abnormalities.   Electronically Signed   By: Hulan Saas M.D.   On: 03/12/2014 08:15   Dg Chest Port 1 View  03/11/2014   CLINICAL DATA:  Pneumonitis.  EXAM: PORTABLE CHEST - 1 VIEW  COMPARISON:  03/10/2014  FINDINGS: Endotracheal and enteric tubes have been removed. Left jugular central venous catheter tip overlies the upper SVC. The cardiac silhouette remains enlarged. Pulmonary vascular congestion persists with similar appearance of bilateral airspace opacities and likely small bilateral pleural effusions. No pneumothorax.  IMPRESSION: No significant interval change in the appearance of the lungs, most suggestive of pulmonary edema and small bilateral pleural effusions.   Electronically Signed   By: Sebastian Ache   On: 03/11/2014 13:25   Dg Chest Portable 1 View  03/10/2014   CLINICAL DATA:  Evaluate airspace disease and endotracheal tube position  EXAM: PORTABLE CHEST - 1 VIEW  COMPARISON:  Portable chest x-ray of 03/09/2014  FINDINGS: There is little change in diffuse airspace disease most consistent with edema. Cardiomegaly and probable small effusions remain.  The endotracheal tube tip is approximately 4.9 cm above the carina. NG tube extends below the hemidiaphragm.  IMPRESSION: Little change in poor aeration in diffuse airspace disease most consistent with edema with probable effusions.   Electronically Signed   By: Dwyane Dee M.D.   On: 03/10/2014 07:51   Dg Chest Port 1 View  03/09/2014   CLINICAL DATA:  Respiratory distress.  EXAM: PORTABLE CHEST - 1 VIEW  COMPARISON:  03/09/2014  FINDINGS: Endotracheal tube is in place with tip approximately 3 cm above the carina. Left IJ central line tip overlies the level of the superior vena cava. Nasogastric tube is in place with tip off the film but beyond the gastroesophageal junction.  The heart is enlarged. There are central airspace filling opacity similar in appearance to the prior study.  IMPRESSION: Persistent bilateral airspace filled opacities.   Electronically Signed   By: Rosalie Gums M.D.   On: 03/09/2014 19:59   Dg Chest Port 1 View  03/09/2014   CLINICAL DATA:  Pulmonary edema.  EXAM: PORTABLE CHEST - 1 VIEW  COMPARISON:  Same day.  FINDINGS: Stable mild cardiomegaly. Endotracheal tube is in grossly good position with distal tip approximately 7 cm above the carina. Nasogastric tube is seen entering stomach. Left internal jugular catheter  line is noted with distal tip in expected position of the SVC. Bilateral perihilar and basilar interstitial densities are noted which are improved compared to prior exam. This is in consistent with improving pulmonary edema. No pneumothorax or significant pleural effusion is noted.  IMPRESSION: Mildly improved bilateral pulmonary edema.   Electronically Signed   By: Roque Lias M.D.   On: 03/09/2014 13:49   Dg Chest Port 1 View  03/09/2014   CLINICAL DATA:  Reassess known airspace disease and support tube positioning  EXAM: PORTABLE CHEST - 1 VIEW  COMPARISON:  Portable chest x-ray of 08 March 2014  FINDINGS: The lungs are adequately inflated. Widespread airspace  consolidation is present but may have slightly improved since yesterday's study. The hemidiaphragms are obscured. There is no pneumothorax. The cardiac silhouette is mildly enlarged. The pulmonary vascularity is engorged.  The endotracheal tube tip lies 3 cm above the crotch of the carina. The left internal jugular venous catheter tip projects over the junction of the proximal and midportions of the SVC. The esophagogastric tube tip projects off the inferior margin of the image.  IMPRESSION: 1. Stable to slightly improved appearance of the pulmonary interstitial edema. 2. The support tubes and lines are in reasonable position.   Electronically Signed   By: David  [place]   On: 03/09/2014 07:50   Dg Chest Port 1 View  03/08/2014   CLINICAL DATA:  Evaluate endotracheal tube position  EXAM: PORTABLE CHEST - 1 VIEW  COMPARISON:  Portable chest x-ray of 03/07/2014  FINDINGS: There has been worsening of airspace disease bilaterally which is primarily perihilar in location most consistent with pulmonary edema and probable effusions. Mild cardiomegaly is stable. The tip of the endotracheal tube is approximately 2.8 cm above the carina. Left IJ central venous catheter remains unchanged in position  IMPRESSION: 1. Worsening of pulmonary edema with probable effusions. 2. Tip of endotracheal tube approximately 2.8 cm above the carina.   Electronically Signed   By: Dwyane Dee M.D.   On: 03/08/2014 08:08   Portable Chest Xray In Am  03/07/2014   CLINICAL DATA:  Hypoxia  EXAM: PORTABLE CHEST - 1 VIEW  COMPARISON:  March 06, 2014  FINDINGS: Endotracheal tube tip is 2.5 cm above the carina. Central catheter tip is at the junction of the left innominate vein and superior vena cava. Nasogastric tube tip and side port are below the diaphragm. No pneumothorax. There is interstitial edema with cardiomegaly and pulmonary venous hypertension. There is a right effusion. There is a minimal left effusion as well. There is mild  airspace consolidation in the left base. No adenopathy.  IMPRESSION: Tube and catheter positions as described without pneumothorax. Evidence of a degree of congestive heart failure. Question alveolar edema versus superimposed pneumonia left base.   Electronically Signed   By: Bretta Bang M.D.   On: 03/07/2014 07:23   Dg Chest Port 1 View  03/06/2014   CLINICAL DATA:  Endotracheal tube and central line placement.  EXAM: PORTABLE CHEST - 1 VIEW  COMPARISON:  Same day.  FINDINGS: Endotracheal tube is in grossly good position with distal tip 4 cm above the carina. Left internal jugular catheter line is noted with distal tip in expected position of the SVC. No pneumothorax is noted. Stable bilateral perihilar and basilar opacities are noted concerning for edema or pneumonia.  IMPRESSION: No pneumothorax status post left internal jugular catheter line placement. Endotracheal tube in grossly good position. These results will be called to the ordering  clinician or representative by the Radiologist Assistant, and communication documented in the PACS or zVision Dashboard.   Electronically Signed   By: Roque Lias M.D.   On: 03/06/2014 13:02   Dg Chest Port 1 View  03/06/2014   CLINICAL DATA:  Shortness of breath.  EXAM: PORTABLE CHEST - 1 VIEW  COMPARISON:  Chest radiograph performed 03/05/2014  FINDINGS: The lungs are well-aerated. Right basilar airspace opacity may reflect pneumonia or mild pulmonary edema. Underlying vascular congestion is noted. A small right pleural effusion is suspected. No pneumothorax is seen.  The cardiomediastinal silhouette is borderline enlarged. No acute osseous abnormalities are seen.  IMPRESSION: 1. Right basilar airspace opacity may reflect pneumonia or mild pulmonary edema. Small right pleural effusion suspected. 2. Underlying vascular congestion and borderline cardiomegaly.   Electronically Signed   By: Roanna Raider M.D.   On: 03/06/2014 06:48   Dg Chest Portable 1  View  03/05/2014   CLINICAL DATA:  Leg pain.  Smoker.  EXAM: PORTABLE CHEST - 1 VIEW  COMPARISON:  Chest radiograph 09/25/2013  FINDINGS: Cardiac leads project over the chest. Cardiomediastinal silhouette is stable. There is atherosclerotic calcification in the thoracic aortic arch. Lung volumes are low resulting in crowding of pulmonary vascular markings. No airspace disease or pulmonary edema is identified. No acute bony abnormality appreciated.  IMPRESSION: Low lung volumes.  No acute findings identified.  Atherosclerosis of the thoracic aorta.   Electronically Signed   By: Britta Mccreedy M.D.   On: 03/05/2014 19:15   Dg Abd Portable 1v  03/16/2014   CLINICAL DATA:  Feeding tube placement  EXAM: PORTABLE ABDOMEN - 1 VIEW  COMPARISON:  03/08/2014  FINDINGS: The weighted feeding tube is identified in the left upper quadrant of the abdomen. This is in the expected location of the body of stomach. Right upper quadrant surgical clips are noted. There is enteric contrast material opacifying the colon.  IMPRESSION: 1. Tip of the feeding tube is in the expected location of the body of stomach.   Electronically Signed   By: Signa Kell M.D.   On: 03/16/2014 19:23   Dg Abd Portable 1v  03/08/2014   CLINICAL DATA:  Orogastric tube placement  EXAM: PORTABLE ABDOMEN - 1 VIEW  COMPARISON:  None.  FINDINGS: Orogastric tube tip and side port are in the stomach. There is contrast in the colon. The bowel gas pattern is unremarkable. No obstruction or free air appreciable.  IMPRESSION: Orogastric tube tip and side port are in the stomach. Bowel gas pattern unremarkable.   Electronically Signed   By: Bretta Bang M.D.   On: 03/08/2014 16:09   Dg Abd Portable 1v  03/06/2014   CLINICAL DATA:  NG tube placement.  EXAM: PORTABLE ABDOMEN - 1 VIEW  COMPARISON:  03/06/2014  FINDINGS: The endotracheal tube is 2.7 cm above the carina. The left IJ catheter tip is in the mid SVC. The NG tube tip is in the stomach. Persistent  edema and right lower lobe airspace process.  IMPRESSION: The NG tube is in the stomach.  ETT is 2.7 cm above the carina.  Left IJ catheter tip is in the mid SVC.   Electronically Signed   By: Loralie Champagne M.D.   On: 03/06/2014 15:43   Dg Foot Complete Left  03/05/2014   CLINICAL DATA:  Left foot pain. Soft tissue ulceration. Redness and discoloration.  EXAM: LEFT FOOT - COMPLETE 3+ VIEW  COMPARISON:  08/10/2012  FINDINGS: There is no fracture  or dislocation or bone destruction suggestive of osteomyelitis. There are 2 screws in the distal tibia and a side plate and multiple screws in the distal fibula. 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 Small plantar calcaneal spur.  IMPRESSION: No acute abnormalities.  No change since the prior exam.   Electronically Signed   By: Geanie Cooley M.D.   On: 03/05/2014 22:09   Dg Swallowing Func-speech Pathology  03/19/2014   Carolan Shiver, CCC-SLP     03/19/2014  4:52 PM Objective Swallowing Evaluation: Modified Barium Swallowing Study   Patient Details  Name: CALEIGH RABELO MRN: [id number] Date of Birth: 1955/11/04  Today's Date: 03/19/2014 Time: 1530-1600 SLP Time Calculation (min): 30 min  Past Medical History:  Past Medical History  Diagnosis Date  . Diabetes mellitus   . Peripheral vascular disease   . Hyperlipidemia   . Obesity   . Hypertension   . Arthritis   . Ulcer of ankle     right  . Coronary artery disease   . COPD (chronic obstructive pulmonary disease)   . NSTEMI (non-ST elevated myocardial infarction)    Past Surgical History:  Past Surgical History  Procedure Laterality Date  . Cholecystectomy    . Femoral-tibial bypass graft Right 09/27/2013    Procedure: BYPASS GRAFT FEMORAL-TIBIAL ARTERY-RIGHT;  Surgeon:  Nada Libman, MD;  Location: [facility] OR;  Service: Vascular;   Laterality: Right;  . Abdominal aorta stent Right   . Bypass graft popliteal to tibial Right   . Coronary angioplasty with stent placement      proximcal RCA stent ~  2001  . Amputation Right 10/14/2013    Procedure: AMPUTATION RAY;  Surgeon: Nadara Mustard, MD;   Location: [facility] OR;  Service: Orthopedics;  Laterality: Right;   Right 2nd Ray Amputation, [place] Graft Right Ankle   HPI:  58 year old female with h/o COPD, DM, peripheral vascular  disease, admitted worsening left lower extremity pain and  and  abdominal pain, developed acute hypoxic respiratory failure due  to pulmonary edema requiring intubation 9/14-9/21, extubated and  reintubated 9/21-9/23, NSTEMI, MSSA bacteremia after recent  angiogram. FEES on 9/24 revealed severe acute reversible  dysphagia characterized by intubation related sensory deficits,  generalized weakness, and moderately severe laryngeal and  pharyngeal edema resulting in a delayed swallow initiation and  decreased laryngeal closure. Deep penetration and aspiration with  inconsistent sensation of all consistencies.  NG placed.  MBS  today to determine any potential for conservative POs.       Assessment / Plan / Recommendation Clinical Impression  Dysphagia Diagnosis: Severe pharyngeal phase dysphagia Clinical impression: Pt presents with persisting severe  pharyngeal phase dysphagia related primarily to impaired  laryngeal function during the swallow.  Pt continued to present  with sensory deficits, delayed onset of swallow response, and  penetration of nectar and honey-thick liquids posteriorly into  larynx.  These consistencies were then aspirated and coursed down  posterior tracheal wall.  Occasionally, a cough response was  elicited, but aspiration was generally silent.  Purees were not  observed to enter larynx; however, shoulder position obfuscated  view during portion of study. Pt with marginal gains but is not  yet safe to begin a PO diet.   Recommend: 1) continue NPO with NG feeds; 2) begin therapeutic  trials of purees with SLP only;  3) therapeutic exercise to target laryngeal strengthening; 4)  repeat FEES in 3-5 days.      Treatment  Recommendation  Therapy as outlined in treatment plan below    Diet Recommendation NPO;Alternative means - temporary;Ice chips  PRN after oral care   Medication Administration: Via alternative means    Other  Recommendations Oral Care Recommendations: Oral care prior  to ice chips   Follow Up Recommendations       Frequency and Duration min 3x week  2 weeks       SLP Swallow Goals     General HPI: 58 year old female with h/o COPD, DM, peripheral  vascular disease, admitted worsening left lower extremity pain  and  and abdominal pain, developed acute hypoxic respiratory  failure due to pulmonary edema requiring intubation 9/14-9/21,  extubated and reintubated 9/21-9/23, NSTEMI, MSSA bacteremia  after recent angiogram. FEES on 9/24 revealed severe acute  reversible dysphagia characterized by intubation related sensory  deficits, generalized weakness, and moderately severe laryngeal  and pharyngeal edema resulting in a delayed swallow initiation  and decreased laryngeal closure. Deep penetration and aspiration  with inconsistent sensation of all consistencies.  NG placed.   MBS today to determine any potential for conservative POs.   Type of Study: Modified Barium Swallowing Study Reason for Referral: Objectively evaluate swallowing function Previous Swallow Assessment: see HPI Diet Prior to this Study: NPO Temperature Spikes Noted: No Respiratory Status: Nasal cannula History of Recent Intubation: Yes Length of Intubations (days): 7 days Date extubated: 03/15/14 Behavior/Cognition: Alert;Cooperative;Pleasant mood Oral Cavity - Dentition: Adequate natural dentition Oral Motor / Sensory Function: Within functional limits Self-Feeding Abilities: Able to feed self Patient Positioning: Upright in chair Baseline Vocal Quality: Hoarse;Breathy;Low vocal intensity Volitional Cough: Weak Volitional Swallow: Able to elicit Anatomy: Within functional limits (edema visualized on prior  FEES; MBS unable to identify) Pharyngeal  Secretions: Not observed secondary MBS    Reason for Referral Objectively evaluate swallowing function   Oral Phase Oral Preparation/Oral Phase Oral Phase: Impaired Oral - Honey Oral - Honey Cup: Lingual pumping Oral - Nectar Oral - Nectar Cup: Lingual pumping Oral - Solids Oral - Puree: Lingual pumping   Pharyngeal Phase Pharyngeal Phase Pharyngeal Phase: Impaired Pharyngeal - Honey Pharyngeal - Honey Teaspoon: Delayed swallow  initiation;Pharyngeal residue - valleculae;Pharyngeal residue -  pyriform sinuses;Trace aspiration;Reduced airway/laryngeal  closure;Premature spillage to pyriform  sinuses;Penetration/Aspiration during swallow Penetration/Aspiration details (honey teaspoon): Material enters  airway, passes BELOW cords without attempt by patient to eject  out (silent aspiration) Pharyngeal - Honey Cup: Delayed swallow initiation;Pharyngeal  residue - valleculae;Pharyngeal residue - pyriform sinuses;Trace  aspiration;Reduced airway/laryngeal closure;Premature spillage to  pyriform sinuses;Penetration/Aspiration during swallow Penetration/Aspiration details (honey cup): Material enters  airway, passes BELOW cords without attempt by patient to eject  out (silent aspiration) Pharyngeal - Nectar Pharyngeal - Nectar Teaspoon: Not tested Pharyngeal - Nectar Cup: Delayed swallow initiation;Pharyngeal  residue - valleculae;Pharyngeal residue - pyriform sinuses;Trace  aspiration;Reduced airway/laryngeal closure;Premature spillage to  pyriform sinuses;Penetration/Aspiration during swallow Penetration/Aspiration details (nectar cup): Material enters  airway, passes BELOW cords without attempt by patient to eject  out (silent aspiration) Pharyngeal - Thin Pharyngeal - Ice Chips: Not tested Pharyngeal - Solids Pharyngeal - Puree: Delayed swallow initiation;Pharyngeal residue  - valleculae;Pharyngeal residue - pyriform sinuses;Premature  spillage to valleculae Penetration/Aspiration details (puree): Material enters airway,   remains ABOVE vocal cords and not ejected out  Cervical Esophageal Phase   Amanda L. Samson Fredericouture, [place] CCC/SLP Pager [phone]               Blenda MountsCouture, Amanda Laurice 03/19/2014, 4:50 PM     Microbiology: Recent  Results (from the past 240 hour(s))  MRSA PCR SCREENING     Status: None   Collection Time    03/22/14  4:35 PM      Result Value Ref Range Status   MRSA by PCR NEGATIVE  NEGATIVE Final   Comment:            The GeneXpert MRSA Assay (FDA     approved for NASAL specimens     only), is one component of a     comprehensive MRSA colonization     surveillance program. It is not     intended to diagnose MRSA     infection nor to guide or     monitor treatment for     MRSA infections.     Labs: Basic Metabolic Panel:  Recent Labs Lab 03/22/14 0500 03/23/14 0400 03/24/14 0430 03/25/14 0140 03/26/14 0507 03/27/14 0500  NA 143 143 141 142 138 141  K 3.8 3.7 3.4* 4.2 4.0 4.0  CL 103 104 103 106 102 105  CO2 29 30 28 27 28 27   GLUCOSE 132* 178* 100* 147* 148* 146*  BUN 18 17 19 12 9 11   CREATININE 0.36* 0.39* 0.43* 0.39* 0.40* 0.40*  CALCIUM 8.5 8.6 8.5 8.4 8.7 8.6  MG 2.1  --   --   --   --   --   PHOS 3.9  --   --   --   --   --    Liver Function Tests:  Recent Labs Lab 03/22/14 0500  AST 22  ALT 13  ALKPHOS 54  BILITOT 0.4  PROT 6.1  ALBUMIN 2.4*   No results found for this basename: LIPASE, AMYLASE,  in the last 168 hours No results found for this basename: AMMONIA,  in the last 168 hours CBC:  Recent Labs Lab 03/22/14 0500 03/23/14 0400 03/25/14 0140  WBC 5.7 5.4 5.0  NEUTROABS 3.4  --   --   HGB 10.7* 10.3* 9.8*  HCT 34.4* 33.9* 31.4*  MCV 77.7* 75.8* 76.2*  PLT 256 266 242   Cardiac Enzymes: No results found for this basename: CKTOTAL, CKMB, CKMBINDEX, TROPONINI,  in the last 168 hours BNP: BNP (last 3 results)  Recent Labs  03/06/14 1015 03/19/14 0630  PROBNP 8842.0* 4761.0*   CBG:  Recent Labs Lab 03/26/14 1616 03/26/14 2107  03/27/14 0017 03/27/14 0441 03/27/14 0738  GLUCAP 199* 233* 203* 153* 153*       Signed:  Marinda Elk  [facility] 03/27/2014, 8:55 AM

## 2014-03-27 NOTE — Progress Notes (Signed)
Rehab admissions - I have been following pt's case and noted pt's progress with therapies over the weekend. I then called and spoke with insurance RN directly about pt's case and gave clinical information by phone. We received insurance authorization for inpatient rehab and have bed available today.  I then spoke with Dr. David StallFeliz Ortiz who gave medical clearance for pt to come to inpatient rehab.  I called and updated pt by phone. Pt was pleased with the news and I will complete admission paperwork shortly with pt. I called and updated Crystal, case management and Lupita LeashDonna with social work.  We will admit pt today to inpatient rehab. Please call me with any questions. Thanks.  Juliann MuleJanine Jaydalee Bardwell, PT Rehabilitation Admissions Coordinator [phone]

## 2014-03-27 NOTE — Progress Notes (Signed)
Occupational Therapy Treatment Patient Details Name: [name] MRN: [id number] DOB: 1956-03-11 Today's Date: 03/27/2014    History of present illness 58 year old female with h/o COPD, DM, peripheral vascular disease, admitted worsening left lower extremity pain and and abdominal pain, developed acute hypoxic respiratory failure due to pulmonary edema requiring intubation, NSTEMI, MSSA bacteremia after recent angiogram. Initial CXR 9/13 clear however CXR 9/14 notes right basilar airspace opacity may reflect pneumonia.  s/p cardiac catheterization 03/22/14.   OT comments  Pt continues to progress well and is anxious to get to rehab later today to begin intense therapy.  Met toileting goal. Instructed in energy conservation and pacing.  Follow Up Recommendations  CIR    Equipment Recommendations       Recommendations for Other Services      Precautions / Restrictions Precautions Precautions: Fall Precaution Comments:  Restrictions: Weight Bearing Restrictions: No       Mobility Bed Mobility Overal bed mobility: Needs Assistance Bed Mobility: Supine to Sit;Sit to Supine    Supine to sit: Supervision Sit to supine: Supervision   General bed mobility comments: no physical assist, HOB flat, used rail and required increased time.  Transfers Overall transfer level: Needs assistance Equipment used: Rolling walker (2 wheeled) Transfers: Sit to/from Stand Sit to Stand: Min guard         General transfer comment: vc's for hand placement and posture in standing. Pt with strength sufficient for stand, vc's for wt shift fwd    Balance Overall balance assessment: Needs assistance;History of Falls Sitting-balance support: Bilateral upper extremity supported;Feet supported Sitting balance-Leahy Scale: Good Sitting balance - Comments: able to sit without UE support   Standing balance support: Bilateral upper extremity supported;During functional activity Standing  balance-Leahy Scale: Fair                    ADL Overall ADL's : Needs assistance/impaired     Grooming: Wash/dry hands;Set up;Sitting               Lower Body Dressing: Set up;Sit to/from stand Lower Body Dressing Details (indicate cue type and reason): donned socks at EOB Toilet Transfer: Min guard;Stand-pivot;BSC   Toileting- Clothing Manipulation and Hygiene: Min guard;Sit to/from stand         General ADL Comments: Instructed in energy conservation, pacing, monitoring her own status for need for breaks.      Vision                     Perception     Praxis      Cognition   Behavior During Therapy: WFL for tasks assessed/performed Overall Cognitive Status: Within Functional Limits for tasks assessed                       Extremity/Trunk Assessment               Exercises    Shoulder Instructions       General Comments      Pertinent Vitals/ Pain       Pain Assessment: No/denies pain  Home Living                                          Prior Functioning/Environment              Frequency Min 2X/week  Progress Toward Goals  OT Goals(current goals can now be found in the care plan section)  Progress towards OT goals: Progressing toward goals  Acute Rehab OT Goals Patient Stated Goal: to get to the bathroom, to walk Time For Goal Achievement: 04/04/14  Plan Discharge plan remains appropriate    Co-evaluation                 End of Session     Activity Tolerance Patient tolerated treatment well   Patient Left in bed;with call bell/phone within reach   Nurse Communication          Time: 9449-6759 OT Time Calculation (min): 24 min  Charges: OT General Charges $OT Visit: 1 Procedure OT Treatments $Self Care/Home Management : 23-37 mins  Malka So 03/27/2014, 3:16 PM [phone]

## 2014-03-27 NOTE — H&P (Signed)
Physical Medicine and Rehabilitation Admission H&P  Chief Complaint   Patient presents with   .  Debility due to sepsis complicated by NSTEMI, left inguinal hematoma, acute CHF   HPI: [name] is a 58 y.o. 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 She was admitted on 03/05/14 with hypotension, tachycardia, fever of 101.9, leucocytosis and significant Left flank swelling. She was started on IV antibiotics for sepsis and was found to have left inguinal hematoma on CT. Patient was noted to have positive troponin's due to NSTEMI and started on IV heparin with serial CBC for monitoring. Right heel culture positive for pseudomonas and BC X 2 positive for MSSA. TEE negative for thrombus or vegetations. Hospital course complicated by respiratory failure requiring intubation on 09/14 to 9/19 and reintubation 09/21 to 9/23. She underwent cardiac cath on 09/18 by Dr. Irish Lack revealing severe 3 vessel CAD with EF 10-15%. She was not felt to be a surgical candidate per Dr. Prescott Gum. CAD treated medically and acute on chronic systolic CHF treated with diuretics with clinical improvement.  Left transudate effusion tapped for 500 cc clear fluid. Speech therapy following for treatment of dysphagia with s/s of aspiration. MBS done on 09/27 revealing severe pharyngeal dysphagia and NPO recommended due to silent aspiration. She underwent PCI of RCA and LAD on 03/22/14 by Dr. Burt Knack with recommendations for ASA and Plavix for lifelong duration. She has completed 3 week course of antibiotics for MSSA bacteremia as well as pseudomonas positive right groin wound. Patient with improvement in hoarseness and FEES done on 10/01 revealing shallow penetration of honey with ability to cough and diet initiated and advanceto Dysphagia 3, honey liquids by tsp. Medications being adjusted  due to low blood pressure She is showing improvement in mobility with improvement in activity tolerance.   Review of Systems  HENT: Negative for hearing loss.  Eyes: Negative for blurred vision and double vision.  Respiratory: Negative for cough, shortness of breath and wheezing.  Cardiovascular: Negative for chest pain and palpitations.  Gastrointestinal: Negative for heartburn, nausea and constipation.  Genitourinary: Negative for dysuria.  Musculoskeletal: Positive for back pain and joint pain (chronic bilateral knee pain due to OA).  Neurological: Positive for sensory change (bilateral feet numb and painful). Negative for headaches.  Psychiatric/Behavioral: Negative for memory loss. The patient does not have insomnia.   Past Medical History   Diagnosis  Date   .  Diabetes mellitus    .  Peripheral vascular disease    .  Hyperlipidemia    .  Obesity    .  Hypertension    .  Arthritis    .  Ulcer of ankle      right   .  Coronary artery disease    .  COPD (chronic obstructive pulmonary disease)    .  NSTEMI (non-ST elevated myocardial infarction)     Past Surgical History   Procedure  Laterality  Date   .  Cholecystectomy     .  Femoral-tibial bypass graft  Right  09/27/2013     Procedure: BYPASS GRAFT FEMORAL-TIBIAL ARTERY-RIGHT; Surgeon: Serafina Mitchell, MD; Location: [facility]; Service: Vascular; Laterality: Right;   .  Abdominal aorta stent  Right    .  Bypass graft popliteal to tibial  Right    .  Coronary angioplasty with  stent placement       proximcal RCA stent ~ 2001   .  Amputation  Right  10/14/2013     Procedure: AMPUTATION RAY; Surgeon: Nadara Mustard, MD; Location: [facility] OR; Service: Orthopedics; Laterality: Right; Right 2nd Ray Amputation, [place] Graft Right Ankle    Family History   Problem  Relation  Age of Onset   .  Cancer  Mother    .  Heart disease  Father    .  Hyperlipidemia  Father    .  Hypertension  Father    .  Other  Father      varicose veins   .   Heart attack  Father    .  Heart disease  Sister    .  Hyperlipidemia  Sister    .  Hypertension  Sister    .  Other  Sister      varicose veins   .  Heart attack  Sister    .  Heart disease  Brother    .  Hypertension  Brother    .  Hyperlipidemia  Brother    .  Other  Brother      varicose veins   .  Heart attack  Brother     Social History: Lives alone. 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 Son lives next door and has a supportive family that can check in past discharge. She reports that she has been smoking Cigarettes--1-2 PPD. She has a 30 pack-year smoking history. She has never used smokeless tobacco. She reports that she does not drink alcohol or use illicit drugs.  Allergies   Allergen  Reactions   .  Penicillins  Hives     Tolerates cefepime   .  Adhesive [Tape]  Rash    Medications Prior to Admission   Medication  Sig  Dispense  Refill   .  alprazolam (XANAX) 2 MG tablet  Take 1 mg by mouth every 6 (six) hours as needed for sleep or anxiety.     Marland Kitchen  aspirin EC 325 MG tablet  Take 325 mg by mouth daily.     Marland Kitchen  atorvastatin (LIPITOR) 20 MG tablet  Take 20 mg by mouth daily.     Marland Kitchen  escitalopram (LEXAPRO) 10 MG tablet  Take 10 mg by mouth daily.     .  fentaNYL (DURAGESIC - DOSED MCG/HR) 100 MCG/HR  Place 100 mcg onto the skin every 3 (three) days.     .  fluticasone (FLONASE) 50 MCG/ACT nasal spray  Place 1 spray into both nostrils daily as needed for allergies.     Marland Kitchen  gabapentin (NEURONTIN) 600 MG tablet  Take 600 mg by mouth 3 (three) times daily.     Marland Kitchen  glimepiride (AMARYL) 2 MG tablet  Take 2 mg by mouth 2 (two) times daily.     .  hydrochlorothiazide (MICROZIDE) 12.5 MG capsule  Take 12.5 mg by mouth daily.     .  meloxicam (MOBIC) 15 MG tablet  Take 15 mg by mouth daily.     .  metFORMIN (GLUCOPHAGE) 500 MG tablet  Take 1,000 mg by mouth 2 (two) times daily with a meal.     .  oxycodone (ROXICODONE) 30 MG immediate  release tablet  Take 30 mg by mouth every 4 (four) hours as needed for pain.     Marland Kitchen  quinapril (ACCUPRIL) 20 MG tablet  Take 20 mg by mouth daily.     .  VESICARE 5 MG tablet  Take 5 mg by mouth daily.      Home:  Home Living  Family/patient expects to be discharged to:: Private residence  Living Arrangements: Alone (son lives right next door)  Available Help at Discharge: Family;Available 24 hours/day (chart stated pt lives in converted garage, son lives in [place])  Type of Home: House  Home Access: Level entry  [place]: One [place]: Environmental consultant - 2 wheels;Shower seat;Cane - single point;Adaptive equipment;Bedside commode  Adaptive Equipment: Reacher  Lives With: Alone  Functional History:  Prior Function  Level of Independence: Independent with assistive device(s)  Functional Status:  Mobility:  Bed Mobility  Overal bed mobility: Needs Assistance  Bed Mobility: Supine to Sit  Rolling: Supervision  Sidelying to sit: Min guard;HOB elevated  Supine to sit: Supervision  Sit to sidelying: Min guard  General bed mobility comments: with rail and HOB elevated pt able to get self seated EOB with incr time, no physical assist needed, cues on ease of technique.  Transfers  Overall transfer level: Needs assistance  Equipment used: Rolling walker (2 wheeled)  Transfers: Sit to/from Stand  Sit to Stand: Min assist  Stand pivot transfers: Max assist;+2 physical assistance  General transfer comment: sit/stand x3 reps total: x1 from lowest bed position, x2 from recliner chair. all with min assist to power up and cues on technique/ant weight shift to assist with standing.  Ambulation/Gait  Ambulation/Gait assistance: Min assist;+2 safety/equipment  Ambulation Distance (Feet): 12 Feet  Assistive device: Rolling walker (2 wheeled)  Gait Pattern/deviations: Step-through pattern;Decreased stride length;Trunk flexed;Wide base of support  Gait velocity: Decreased  Gait velocity  interpretation: Below normal speed for age/gender  General Gait Details: cues on posture, walker position and to incr right foot clearance with gait. all gait was forward (going toward door) with chair follow for safety.   ADL:  ADL  Overall ADL's : Needs assistance/impaired  Eating/Feeding: Supervision/ safety;Bed level (ST doing trial feeding with applesauce)  Grooming: Wash/dry hands;Wash/dry face;Oral care;Brushing hair;Set up;Sitting  Upper Body Bathing: Minimal assitance;Sitting (assisted with back)  Lower Body Bathing: Total assistance;Sit to/from stand  Lower Body Bathing Details (indicate cue type and reason): assist for pericare  Upper Body Dressing : Set up;Sitting  Lower Body Dressing: Set up;Bed level (donned socks)  Lower Body Dressing Details (indicate cue type and reason): pt can cross her foot over her knee in supine to donn and doff her socks  Functional mobility during ADLs: (non ambulatory, transfered with Stedy)  General ADL Comments: Pt with foley removed yesterday, incontinent of urine in bed.  Cognition:  Cognition  Overall Cognitive Status: Within Functional Limits for tasks assessed  Orientation Level: Oriented X4  Cognition  Arousal/Alertness: Awake/alert  Behavior During Therapy: WFL for tasks assessed/performed  Overall Cognitive Status: Within Functional Limits for tasks assessed  Physical Exam:  Blood pressure 112/56, pulse 62, temperature 98 F (36.7 C), temperature source Oral, resp. rate 16, height $RemoveBe'5\' 2"'isEFpPsNb$  (1.575 m), weight 79.8 kg (175 lb 14.8 oz), SpO2 100.00%.  Physical Exam  Nursing note and vitals reviewed.  Constitutional: She is oriented to person, place, and time. She appears well-developed and well-nourished.   HENT:  Head: Normocephalic and atraumatic.  Eyes: Pupils are equal, round, and reactive to light.  Neck: Neck supple.  Dry dressing right neck--site of prior central line.  Cardiovascular: Normal rate. Normal rhythm. No  murmurs Respiratory: Effort  normal and breath sounds normal. No respiratory distress. She has no wheezes.  GI: Soft. Bowel sounds are normal.  Musculoskeletal: She exhibits no edema and no tenderness. BLE with evidence of muscle atrophy. 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 Externally rotated, pes planus Neurological: She is alert and oriented to person, place, and time.  Strong voice without dysphonia. Follows basic commands without difficulty.  UE: 4/5 proximal to distal. LE: 2+ HF, 3 KE, 4/5 ankle. Decreased PP/LT below knees bilaterally below knees.  Skin:  Dry ulcerated areas bilateral great toes. Right lateral malleolus with stage 2 ulcer. Multiple dry scabbed areas on top of foot. Bilateral shins with chronic vascular changes. Resolving ecchymosis right wrist . Sacrum with budding yeast on coccyx as well as diffuse peeling intergluteal area. Right inguinal wound healing with 2cm area. Marland Kitchen  Psychiatric: She has a normal mood and affect. Her behavior is normal. Judgment and thought content normal.    Results for orders placed during the hospital encounter of 03/05/14 (from the past 48 hour(s))   GLUCOSE, CAPILLARY Status: Abnormal    Collection Time    03/25/14 4:28 PM   Result  Value  Ref Range    Glucose-Capillary  160 (*)  70 - 99 mg/dL   GLUCOSE, CAPILLARY Status: Abnormal    Collection Time    03/25/14 9:17 PM   Result  Value  Ref Range    Glucose-Capillary  176 (*)  70 - 99 mg/dL   BASIC METABOLIC PANEL Status: Abnormal    Collection Time    03/26/14 5:07 AM   Result  Value  Ref Range    Sodium  138  137 - 147 mEq/L    Potassium  4.0  3.7 - 5.3 mEq/L    Chloride  102  96 - 112 mEq/L    CO2  28  19 - 32 mEq/L    Glucose, Bld  148 (*)  70 - 99 mg/dL    BUN  9  6 - 23 mg/dL    Creatinine, Ser  0.40 (*)  0.50 - 1.10 mg/dL    Calcium  8.7  8.4 - 10.5 mg/dL    GFR calc non Af Amer  >90  >90 mL/min    GFR calc Af Amer  >90  >90 mL/min    Comment:  (NOTE)      The eGFR has been calculated using the CKD EPI equation.     This calculation has not been validated in all clinical situations.     eGFR's persistently <90 mL/min signify possible Chronic Kidney     Disease.    Anion gap  8  5 - 15   GLUCOSE, CAPILLARY Status: Abnormal    Collection Time    03/26/14 6:22 AM   Result  Value  Ref Range    Glucose-Capillary  129 (*)  70 - 99 mg/dL   GLUCOSE, CAPILLARY Status: Abnormal    Collection Time    03/26/14 10:46 AM   Result  Value  Ref Range    Glucose-Capillary  296 (*)  70 - 99 mg/dL   GLUCOSE, CAPILLARY Status: Abnormal    Collection Time    03/26/14 4:16 PM   Result  Value  Ref Range    Glucose-Capillary  199 (*)  70 - 99 mg/dL   GLUCOSE, CAPILLARY Status: Abnormal    Collection Time    03/26/14 9:07 PM   Result  Value  Ref Range    Glucose-Capillary  233 (*)  70 - 99 mg/dL   GLUCOSE, CAPILLARY Status: Abnormal    Collection Time    03/27/14 12:17 AM   Result  Value  Ref Range    Glucose-Capillary  203 (*)  70 - 99 mg/dL    Comment 1  Notify RN    GLUCOSE, CAPILLARY Status: Abnormal    Collection Time    03/27/14 4:41 AM   Result  Value  Ref Range    Glucose-Capillary  153 (*)  70 - 99 mg/dL   BASIC METABOLIC PANEL Status: Abnormal    Collection Time    03/27/14 5:00 AM   Result  Value  Ref Range    Sodium  141  137 - 147 mEq/L    Potassium  4.0  3.7 - 5.3 mEq/L    Chloride  105  96 - 112 mEq/L    CO2  27  19 - 32 mEq/L    Glucose, Bld  146 (*)  70 - 99 mg/dL    BUN  11  6 - 23 mg/dL    Creatinine, Ser  0.40 (*)  0.50 - 1.10 mg/dL    Calcium  8.6  8.4 - 10.5 mg/dL    GFR calc non Af Amer  >90  >90 mL/min    GFR calc Af Amer  >90  >90 mL/min    Comment:  (NOTE)     The eGFR has been calculated using the CKD EPI equation.     This calculation has not been validated in all clinical situations.     eGFR's persistently <90 mL/min signify possible Chronic Kidney     Disease.    Anion gap  9  5 - 15   GLUCOSE, CAPILLARY  Status: Abnormal    Collection Time    03/27/14 7:38 AM   Result  Value  Ref Range    Glucose-Capillary  153 (*)  70 - 99 mg/dL   GLUCOSE, CAPILLARY Status: Abnormal    Collection Time    03/27/14 10:54 AM   Result  Value  Ref Range    Glucose-Capillary  205 (*)  70 - 99 mg/dL    No results found.  Medical Problem List and Plan:  1. Functional deficits secondary to deconditioning after multiple medical issues/sepsis/respiratory failure  2. DVT Prophylaxis/Anticoagulation: Pharmaceutical: Lovenox  3. Chronic pain/Pain Management: continue Neurontin tid. Used fentanyl 100 mg as well as oxycodone 30 mg prn at home for chronic bilateral knee as well as neuropathic foot pain.  4. Mood: team to provide ego support. Continue Lexapro. LCSW to follow for support and evaluation.  5. Neuropsych: This patient is capable of making decisions on her own behalf.  6. Skin/Wound Care: routine pressure relief measures. Monitor left groin daily.  7. Fluids/Electrolytes/Nutrition: Monitor I/O.  8. NSTEMI s/p PCI RCA, CAD and CFX DES: Continue ASA/Plavix. On Imdur, bisoprolol and Lipitor.  9. Acute systolic heart failure: Monitor daily weights and for other signs of overload. Continue Zebeta, lasix 40 mg/day, Cozaar 25 mg/day and aldactone 12. $RemoveBefor'5mg'CmrRcnrdJuDk$  /day. Monitor for tolerance of medications.  10 DM type 2 with neuropathy: Hgb A1c- 7.2. Will monitor BS with ac/hs checks. Continue lantus insulin 20 units daily. SSI for elevated BS.  11. Right inguinal infection: Right thigh with dime size area with dry dressing.  12. ABLA due to left groin hematoma: H/H improving. Will monitor for stability. Recheck in am.  13. Dysphagia: Diet advanced to Dysphagi 3 textures today and should help po  intake. Continue honey liquids and push po fluids to maintain adequate hydration.  14. Hypotension: Monitor blood pressures every 8 hours and for signs orthostatic signs.  15. Severe protein malnutrition: Albumin-2.4. Continue ensure  supplements bid.  16. AECOPD: Resolved--taper off prednisone.  17. Candida sacral/perineal area: will treat with diflucan   Post Admission Physician Evaluation:  1. Functional deficits secondary to deconditioning after multiple medical issues/sepsis/respiratory failure  1. Patient is admitted to receive collaborative, interdisciplinary care between the physiatrist, rehab nursing staff, and therapy team. 2. Patient's level of medical complexity and substantial therapy needs in context of that medical necessity cannot be provided at a lesser intensity of care such as a SNF. 3. Patient has experienced substantial functional loss from his/her baseline which was documented above under the "Functional History" and "Functional Status" headings. Judging by the patient's diagnosis, physical exam, and functional history, the patient has potential for functional progress which will result in measurable gains while on inpatient rehab. These gains will be of substantial and practical use upon discharge in facilitating mobility and self-care at the household level. 4. Physiatrist will provide 24 hour management of medical needs as well as oversight of the therapy plan/treatment and provide guidance as appropriate regarding the interaction of the two. 5. 24 hour rehab nursing will assist with bladder management, bowel management, safety, skin/wound care, disease management, medication administration, pain management and patient education and help integrate therapy concepts, techniques,education, etc. 6. PT will assess and treat for/with: Lower extremity strength, range of motion, stamina, balance, functional mobility, safety, adaptive techniques and equipment, pain mgt, egosupport, community reintegration. Goals are: mod I. 7. OT will assess and treat for/with: ADL's, functional mobility, safety, upper extremity strength, adaptive techniques and equipment, leisure awareness, community reintegration. Goals are: mod I  to supervision. Therapy may not yet proceed with showering this patient. 8. SLP will assess and treat for/with: n/a. Goals are: n/a. 9. Case Management and Social Worker will assess and treat for psychological issues and discharge planning. 10. Team conference will be held weekly to assess progress toward goals and to determine barriers to discharge. 11. Patient will receive at least 3 hours of therapy per day at least 5 days per week. 12. ELOS: 10-14 days  13. Prognosis: excellent  Meredith Staggers, MD, [place] [facility] 03/27/2014

## 2014-03-27 NOTE — Progress Notes (Signed)
Physical Therapy Treatment Patient Details Name: [name] MRN: [id number] DOB: [birth year] Today's Date: 03/27/2014    History of Present Illness 58 year old female with h/o COPD, DM, peripheral vascular disease, admitted worsening left lower extremity pain and and abdominal pain, developed acute hypoxic respiratory failure due to pulmonary edema requiring intubation, NSTEMI, MSSA bacteremia after recent angiogram. Initial CXR 9/13 clear however CXR 9/14 notes right basilar airspace opacity may reflect pneumonia.  s/p cardiac catheterization 03/22/14.    PT Comments    Pt progressing well, tolerated 100' ambulation with RW and min-guard A with one seated and one standing rest break. Vital signs stable throughout. Demonstrating increased stamina and motivation. PT will continue to follow.   Follow Up Recommendations  CIR;Supervision/Assistance - 24 hour     Equipment Recommendations  Other (comment) (TBA)    Recommendations for Other Services Rehab consult     Precautions / Restrictions Precautions Precautions: Fall Precaution Comments: fell 9/28 with nursing (missed EOB when sitting from stedy) Restrictions Weight Bearing Restrictions: No    Mobility  Bed Mobility Overal bed mobility: Needs Assistance Bed Mobility: Supine to Sit Rolling: Supervision         General bed mobility comments: pt needed rail and increased time to get to EOB but close supervison given , not physical assist  Transfers Overall transfer level: Needs assistance Equipment used: Rolling walker (2 wheeled) Transfers: Sit to/from Stand Sit to Stand: Min guard         General transfer comment: vc's for hand placement and posture in standing. Pt with strength sufficient for stand, vc's for wt shift fwd  Ambulation/Gait Ambulation/Gait assistance: Min guard;+2 safety/equipment Ambulation Distance (Feet): 100 Feet (35', 25', 40') Assistive device: Rolling walker (2 wheeled) Gait  Pattern/deviations: Step-through pattern;Trunk flexed;Decreased stride length Gait velocity: Decreased   General Gait Details: cues for posture for increased oxygenation, with with increased WOB with distance. Took one seated rest break and one standing. Pt fatigued after ambulaion but much improvement in stamina for her.   Stairs            Wheelchair Mobility    Modified Rankin (Stroke Patients Only)       Balance Overall balance assessment: Needs assistance;History of Falls Sitting-balance support: Bilateral upper extremity supported;Feet supported Sitting balance-Leahy Scale: Good Sitting balance - Comments: able to sit without UE support   Standing balance support: Bilateral upper extremity supported;During functional activity Standing balance-Leahy Scale: Fair Standing balance comment: pt able to maintain balance for short time in standing without UE support and without challenge of dynamic activity                    Cognition Arousal/Alertness: Awake/alert Behavior During Therapy: WFL for tasks assessed/performed Overall Cognitive Status: Within Functional Limits for tasks assessed                      Exercises General Exercises - Upper Extremity Chair Push Up: AROM;5 reps;Seated General Exercises - Lower Extremity Ankle Circles/Pumps: AROM;Both;10 reps;Seated Long Arc Quad: AROM;Strengthening;Both;10 reps;Seated Hip Flexion/Marching: AROM;Both;10 reps;Seated    General Comments General comments (skin integrity, edema, etc.): VSS throughout ambulation today, even when feeling winded, O2 sats 98% on RA, HR 86      Pertinent Vitals/Pain Pain Assessment: No/denies pain    Home Living                      Prior Function  PT Goals (current goals can now be found in the care plan section) Acute Rehab PT Goals Patient Stated Goal: to get to the bathroom, to walk PT Goal Formulation: With patient Time For Goal  Achievement: 03/31/14 Potential to Achieve Goals: Good Progress towards PT goals: Progressing toward goals    Frequency  Min 3X/week    PT Plan Current plan remains appropriate    Co-evaluation             End of Session Equipment Utilized During Treatment: Gait belt Activity Tolerance: Patient tolerated treatment well Patient left: in chair;with call bell/phone within reach     Time: 1037-1051 PT Time Calculation (min): 14 min  Charges:  $Gait Training: 8-22 mins                    G Codes:     Lyanne Co, PT  Acute Rehab Services  [phone]  Lyanne Co 03/27/2014, 12:26 PM

## 2014-03-27 NOTE — Progress Notes (Signed)
Speech Language Pathology Treatment: Dysphagia  Patient Details Name: [name] MRN: [id number] DOB: [birth year] Today's Date: 03/27/2014 Time: [id number] SLP Time Calculation (min): 10 min  Assessment / Plan / Recommendation Clinical Impression  Pt. happy to see this SLP, stating "I have questions for you", asking if she could upgrade po's.  Observation of solid texture was functional with adequate oral prep, mastication and transit with solid texture with modified independence to check for potential pocketed food.  No evidence of decreased laryngeal protection.  Recommend upgrade to Dys 3 and continue honey thick liquids.  CIR coordinator arrived and pt. being admitted to inpatient rehab today where ST can continue to work towards thin liquids.     HPI HPI: 58 year old female with h/o COPD, DM, peripheral vascular disease, admitted worsening left lower extremity pain and  and abdominal pain, developed acute hypoxic respiratory failure due to pulmonary edema requiring intubation 9/14-9/21, extubated and reintubated 9/21-9/23, NSTEMI, MSSA bacteremia after recent angiogram. FEES on 9/24 revealed severe acute reversible dysphagia characterized by intubation related sensory deficits, generalized weakness, and moderately severe laryngeal and pharyngeal edema resulting in a delayed swallow initiation and decreased laryngeal closure. Deep penetration and aspiration with inconsistent sensation of all consistencies.  NG placed.  BSE reordered.     Pertinent Vitals Pain Assessment: No/denies pain  SLP Plan  Continue with current plan of care    Recommendations Diet recommendations: Dysphagia 3 (mechanical soft);Honey-thick liquid Medication Administration: Whole meds with puree Supervision: Patient able to self feed;Intermittent supervision to cue for compensatory strategies Compensations: Slow rate;Small sips/bites;Multiple dry swallows after each bite/sip;Hard cough after swallow Postural Changes  and/or Swallow Maneuvers: Seated upright 90 degrees              General recommendations: Rehab consult Oral Care Recommendations: Oral care BID Follow up Recommendations: Inpatient Rehab Plan: Continue with current plan of care    GO     Royce MacadamiaLitaker, Sandon Yoho Willis 03/27/2014, 11:54 AM  Breck CoonsLisa Willis Lonell FaceLitaker M.Ed [employer]-SLP Pager [phone]

## 2014-03-27 NOTE — Progress Notes (Signed)
PROGRESS NOTE  Subjective:    Underwent PCI of LAD x 3 DES and LCX x 1 DES on 9/30.   Yesterday Cozaar and spironolactone cut back due to soft BP. SBP a little better. Weight up 3 pounds.   Denies SOB/PND/Orthopnea.    Objective:    Vital Signs:   Temp:  [98 F (36.7 C)-98.5 F (36.9 C)] 98 F (36.7 C) (10/05 0545) Pulse Rate:  [59-64] 64 (10/05 0545) Resp:  [16-18] 16 (10/05 0545) BP: (86-104)/(41-58) 97/47 mmHg (10/05 0545) SpO2:  [97 %-100 %] 100 % (10/05 0545) Weight:  [175 lb 14.8 oz (79.8 kg)] 175 lb 14.8 oz (79.8 kg) (10/05 0545)  Last BM Date: 03/26/14   24-hour weight change: Weight change: 3 lb 1.4 oz (1.4 kg)  Weight trends: Filed Weights   03/25/14 0449 03/26/14 0507 03/27/14 0545  Weight: 176 lb 9.4 oz (80.1 kg) 172 lb 13.5 oz (78.4 kg) 175 lb 14.8 oz (79.8 kg)    Intake/Output:  10/04 0701 - 10/05 0700 In: 470 [P.O.:470] Out: 650 [Urine:650] Total I/O In: 240 [P.O.:240] Out: 300 [Urine:300]   Physical Exam: BP 97/47  Pulse 64  Temp(Src) 98 F (36.7 C) (Oral)  Resp 16  Ht 5\' 2"  (1.575 m)  Wt 175 lb 14.8 oz (79.8 kg)  BMI 32.17 kg/m2  SpO2 100%  Wt Readings from Last 3 Encounters:  03/27/14 175 lb 14.8 oz (79.8 kg)  03/27/14 175 lb 14.8 oz (79.8 kg)  03/27/14 175 lb 14.8 oz (79.8 kg)    General: Vital signs reviewed and noted.   Head: Normocephalic, atraumatic.  Eyes: conjunctivae/corneas clear.  EOM's intact.   Throat: normal  Neck:  RIJ in place JVP 5-6  Lungs:  Decreased BS   Heart:  RRR, no s3 2/6 TR  Abdomen:  Soft, non-tender, non-distended    Extremities:  Warm no edema.   Neurologic: Moves all 4  Psych: A&Ox3    Labs: BMET:  Recent Labs  03/26/14 0507 03/27/14 0500  NA 138 141  K 4.0 4.0  CL 102 105  CO2 28 27  GLUCOSE 148* 146*  BUN 9 11  CREATININE 0.40* 0.40*  CALCIUM 8.7 8.6    Liver function tests: No results found for this basename: AST, ALT, ALKPHOS, BILITOT, PROT, ALBUMIN,  in the last  72 hours No results found for this basename: LIPASE, AMYLASE,  in the last 72 hours  CBC:  Recent Labs  03/25/14 0140  WBC 5.0  HGB 9.8*  HCT 31.4*  MCV 76.2*  PLT 242    Cardiac Enzymes: No results found for this basename: CKTOTAL, CKMB, TROPONINI,  in the last 72 hours  Coagulation Studies: No results found for this basename: LABPROT, INR,  in the last 72 hours  Other: No components found with this basename: POCBNP,  No results found for this basename: DDIMER,  in the last 72 hours No results found for this basename: HGBA1C,  in the last 72 hours No results found for this basename: CHOL, HDL, LDLCALC, TRIG, CHOLHDL,  in the last 72 hours No results found for this basename: TSH, T4TOTAL, FREET3, T3FREE, THYROIDAB,  in the last 72 hours No results found for this basename: VITAMINB12, FOLATE, FERRITIN, TIBC, IRON, RETICCTPCT,  in the last 72 hours   Other results:  EKG :  NSR at 93.  Old ant. MI  Medications:    Infusions: . sodium chloride 10 mL/hr at 03/24/14 0201  Scheduled Medications: . alteplase  2 mg Intracatheter Once  . aspirin  81 mg Oral Daily  . atorvastatin  80 mg Oral q1800  . bisoprolol  5 mg Oral Daily  . clopidogrel  75 mg Oral Daily  . escitalopram  10 mg Oral Daily  . furosemide  40 mg Oral Daily  . gabapentin  300 mg Oral TID  . heparin subcutaneous  5,000 Units Subcutaneous 3 times per day  . insulin aspart  0-20 Units Subcutaneous TID WC  . insulin glargine  20 Units Subcutaneous Daily  . losartan  25 mg Oral QHS  . nitroGLYCERIN  0.4 mg Transdermal Q24H  . predniSONE  10 mg Oral Q breakfast  . sodium chloride  10-40 mL Intracatheter Q12H  . spironolactone  12.5 mg Oral Daily    Assessment/ Plan:    1. NSTEMI with severe three-vessel coronary artery disease    --PCI of LAD x 3 DES and LCX x 1 DES on 9/30.  2. Acute on chronic systolic congestive heart failure with an EF of 10-15% -> 30-35% 3. Acute respiratory failure 4.  Fever/leukocytosis    --MSSA bacteremia 9/15 5. Diabetes mellitus 6. COPD 7. Peripheral vascular disease -status post right iliac stent, status post fem - tibioperoneal trunk. L foot ulcerations 8. Hypernatremia 9. Iron-def anemia  S/P PCI of RCA and LAD 9/30. Doing well. P2Y12 therapeutic at 152. Will need life-long DAPT. Discussed risk of stent thrombosis and need to avoid smoking completely in future.   Volume status stable. Continue lasix 40 mg daily and 12.5 mg spironolactone. Continue current dose of bisoprolol and losartan.   Stable to go to CIR.    CLEGG,AMY,NP-C  8:40 AM   Patient seen and examined with Tonye BecketAmy Clegg, NP. We discussed all aspects of the encounter. I agree with the assessment and plan as stated above.   Looks good. Continue current regimen. Stable for CIR.  Lomax Poehler,MD 9:14 AM

## 2014-03-27 NOTE — Procedures (Deleted)
Objective Swallowing Evaluation:    Patient Details  Name: Leodis RainsCheryl C Sherfield MRN: [id number] Date of Birth: 01/10/1956  Today's Date: 03/27/2014 Time: 1126-1136 SLP Time Calculation (min): 10 min  Past Medical History:  Past Medical History  Diagnosis Date  . Diabetes mellitus   . Peripheral vascular disease   . Hyperlipidemia   . Obesity   . Hypertension   . Arthritis   . Ulcer of ankle     right  . Coronary artery disease   . COPD (chronic obstructive pulmonary disease)   . NSTEMI (non-ST elevated myocardial infarction)    Past Surgical History:  Past Surgical History  Procedure Laterality Date  . Cholecystectomy    . Femoral-tibial bypass graft Right 09/27/2013    Procedure: BYPASS GRAFT FEMORAL-TIBIAL ARTERY-RIGHT;  Surgeon: Nada LibmanVance W Brabham, MD;  [facility];  Service: Vascular;  Laterality: Right;  . Abdominal aorta stent Right   . Bypass graft popliteal to tibial Right   . Coronary angioplasty with stent placement      proximcal RCA stent ~ 2001  . Amputation Right 10/14/2013    Procedure: AMPUTATION RAY;  Surgeon: Nadara MustardMarcus V Duda, MD;  [facility];  Service: Orthopedics;  Laterality: Right;  Right 2nd Ray Amputation, Theraskin Graft Right Ankle   HPI:  58 year old female with h/o COPD, DM, peripheral vascular disease, admitted worsening left lower extremity pain and  and abdominal pain, developed acute hypoxic respiratory failure due to pulmonary edema requiring intubation 9/14-9/21, extubated and reintubated 9/21-9/23, NSTEMI, MSSA bacteremia after recent angiogram. FEES on 9/24 revealed severe acute reversible dysphagia characterized by intubation related sensory deficits, generalized weakness, and moderately severe laryngeal and pharyngeal edema resulting in a delayed swallow initiation and decreased laryngeal closure. Deep penetration and aspiration with inconsistent sensation of all consistencies.  NG placed.  BSE reordered.       Assessment / Plan /  Recommendation Clinical Impression       Treatment Recommendation       Diet Recommendation     Medication Administration: Whole meds with puree Supervision: Patient able to self feed;Intermittent supervision to cue for compensatory strategies Compensations: Slow rate;Small sips/bites;Multiple dry swallows after each bite/sip;Hard cough after swallow Postural Changes and/or Swallow Maneuvers: Seated upright 90 degrees    Other  Recommendations Oral Care Recommendations: Oral care BID   Follow Up Recommendations  Inpatient Rehab    Frequency and Duration        Pertinent Vitals/Pain No pain    SLP Swallow Goals     General HPI: 58 year old female with h/o COPD, DM, peripheral vascular disease, admitted worsening left lower extremity pain and  and abdominal pain, developed acute hypoxic respiratory failure due to pulmonary edema requiring intubation 9/14-9/21, extubated and reintubated 9/21-9/23, NSTEMI, MSSA bacteremia after recent angiogram. FEES on 9/24 revealed severe acute reversible dysphagia characterized by intubation related sensory deficits, generalized weakness, and moderately severe laryngeal and pharyngeal edema resulting in a delayed swallow initiation and decreased laryngeal closure. Deep penetration and aspiration with inconsistent sensation of all consistencies.  NG placed.  BSE reordered.   Temperature Spikes Noted: No Respiratory Status: Room air Behavior/Cognition: Alert;Cooperative;Pleasant mood Oral Cavity - Dentition: Adequate natural dentition (some missing) Patient Positioning: Upright in chair    Reason for Referral     Oral Phase     Pharyngeal Phase    Cervical Esophageal Phase    GO             Maxcine HamLaura Paiewonsky  M.S.CCC-SLP Royce Macadamia 03/27/2014, 12:07 PM

## 2014-03-28 ENCOUNTER — Inpatient Hospital Stay (HOSPITAL_COMMUNITY): Payer: BC Managed Care – PPO | Admitting: Occupational Therapy

## 2014-03-28 ENCOUNTER — Inpatient Hospital Stay (HOSPITAL_COMMUNITY): Payer: BC Managed Care – PPO | Admitting: Speech Pathology

## 2014-03-28 ENCOUNTER — Inpatient Hospital Stay (HOSPITAL_COMMUNITY): Payer: BC Managed Care – PPO | Admitting: *Deleted

## 2014-03-28 ENCOUNTER — Ambulatory Visit (INDEPENDENT_AMBULATORY_CARE_PROVIDER_SITE_OTHER): Payer: BC Managed Care – PPO | Admitting: Internal Medicine

## 2014-03-28 DIAGNOSIS — M86169 Other acute osteomyelitis, unspecified tibia and fibula: Secondary | ICD-10-CM

## 2014-03-28 DIAGNOSIS — R5381 Other malaise: Principal | ICD-10-CM

## 2014-03-28 LAB — COMPREHENSIVE METABOLIC PANEL
ALBUMIN: 2.7 g/dL — AB (ref 3.5–5.2)
ALK PHOS: 62 U/L (ref 39–117)
ALT: 16 U/L (ref 0–35)
ANION GAP: 9 (ref 5–15)
AST: 22 U/L (ref 0–37)
BILIRUBIN TOTAL: 0.3 mg/dL (ref 0.3–1.2)
BUN: 13 mg/dL (ref 6–23)
CHLORIDE: 103 meq/L (ref 96–112)
CO2: 27 mEq/L (ref 19–32)
Calcium: 8.9 mg/dL (ref 8.4–10.5)
Creatinine, Ser: 0.49 mg/dL — ABNORMAL LOW (ref 0.50–1.10)
GFR calc Af Amer: >90 mL/min (ref >90)
Glucose, Bld: 192 mg/dL — ABNORMAL HIGH (ref 70–99)
POTASSIUM: 4.4 meq/L (ref 3.7–5.3)
Sodium: 139 mEq/L (ref 137–147)
Total Protein: 6.5 g/dL (ref 6.0–8.3)

## 2014-03-28 LAB — CBC WITH DIFFERENTIAL/PLATELET
BASOS PCT: 0 % (ref 0–1)
Basophils Absolute: 0 10*3/uL (ref 0.0–0.1)
Eosinophils Absolute: 0.1 10*3/uL (ref 0.0–0.7)
Eosinophils Relative: 2 % (ref 0–5)
HEMATOCRIT: 34.4 % — AB (ref 36.0–46.0)
Hemoglobin: 10.6 g/dL — ABNORMAL LOW (ref 12.0–15.0)
LYMPHS PCT: 37 % (ref 12–46)
Lymphs Abs: 1.7 10*3/uL (ref 0.7–4.0)
MCH: 24.4 pg — ABNORMAL LOW (ref 26.0–34.0)
MCHC: 30.8 g/dL (ref 30.0–36.0)
MCV: 79.1 fL (ref 78.0–100.0)
MONOS PCT: 10 % (ref 3–12)
Monocytes Absolute: 0.5 10*3/uL (ref 0.1–1.0)
Neutro Abs: 2.2 10*3/uL (ref 1.7–7.7)
Neutrophils Relative %: 51 % (ref 43–77)
Platelets: 214 10*3/uL (ref 150–400)
RBC: 4.35 MIL/uL (ref 3.87–5.11)
RDW: 21.6 % — ABNORMAL HIGH (ref 11.5–15.5)
WBC: 4.5 10*3/uL (ref 4.0–10.5)

## 2014-03-28 LAB — GLUCOSE, CAPILLARY
GLUCOSE-CAPILLARY: 252 mg/dL — AB (ref 70–99)
Glucose-Capillary: 149 mg/dL — ABNORMAL HIGH (ref 70–99)
Glucose-Capillary: 248 mg/dL — ABNORMAL HIGH (ref 70–99)
Glucose-Capillary: 240 mg/dL — ABNORMAL HIGH (ref 70–99)

## 2014-03-28 MED ORDER — BENEPROTEIN PO POWD
1.0000 | Freq: Three times a day (TID) | ORAL | Status: DC
  Administered 2014-03-28 – 2014-04-11 (×26): 6 g via ORAL
  Filled 2014-03-28 (×2): qty 227

## 2014-03-28 MED ORDER — INFLUENZA VAC SPLIT QUAD 0.5 ML IM SUSY
0.5000 mL | PREFILLED_SYRINGE | INTRAMUSCULAR | Status: AC
  Administered 2014-03-29: 0.5 mL via INTRAMUSCULAR
  Filled 2014-03-28: qty 0.5

## 2014-03-28 MED ORDER — ENSURE PUDDING PO PUDG
1.0000 | Freq: Two times a day (BID) | ORAL | Status: DC
  Administered 2014-03-29 – 2014-03-30 (×2): 1 via ORAL

## 2014-03-28 NOTE — Evaluation (Signed)
Occupational Therapy Assessment and Plan  Patient Details  Name: [name] MRN: [id number] Date of Birth: February 23, 1956  OT Diagnosis: acute pain, muscular wasting and disuse atrophy and muscle weakness (generalized) Rehab Potential: Rehab Potential: Excellent ELOS: 12-14 days   Today's Date: 03/28/2014 OT Individual Time: 1000-1100 OT Individual Time Calculation (min): 60 min     Problem List:  Patient Active Problem List   Diagnosis Date Noted  . Physical debility 03/27/2014  . CAD- S/P RCA PCI '00, LAD (x3) and CFX DES 03/22/14 03/26/2014  . ICM- EF 35-40% by echo 03/08/14 03/26/2014  . Acute respiratory failure 03/14/2014  . Acute systolic heart failure 35/45/6256  . NSTEMI (non-ST elevated myocardial infarction)   . Left leg pain- Left inguinal hematoma on CT 03/05/2014  . Anemia 03/05/2014  . Diabetes mellitus, controlled 03/05/2014  . Aftercare following surgery of the circulatory system, [place] 12/05/2013  . Wound drainage 11/22/2013  . Acute osteomyelitis involving lower leg 09/26/2013  . Diabetic foot infection 09/25/2013  . Fever 09/23/2013  . Sepsis 09/23/2013  . PVD- s/p RFPBPG April 2015 09/23/2013  . Varicose veins of lower extremities with other complications 38/93/7342  . Ulcer of lower limb 09/07/2013  . Venous stasis 09/07/2013  . Ulcer of left lower leg 09/29/2012  . Atherosclerosis of native arteries of the extremities with ulceration(440.23) 05/05/2012    Past Medical History:  Past Medical History  Diagnosis Date  . Diabetes mellitus   . Peripheral vascular disease   . Hyperlipidemia   . Obesity   . Hypertension   . Arthritis   . Ulcer of ankle     right  . Coronary artery disease   . COPD (chronic obstructive pulmonary disease)   . NSTEMI (non-ST elevated myocardial infarction)    Past Surgical History:  Past Surgical History  Procedure Laterality Date  . Cholecystectomy    . Femoral-tibial bypass graft Right 09/27/2013    Procedure:  BYPASS GRAFT FEMORAL-TIBIAL ARTERY-RIGHT;  Surgeon: Serafina Mitchell, MD;  Location[facility] OR;  Service: Vascular;  Laterality: Right;  . Abdominal aorta stent Right   . Bypass graft popliteal to tibial Right   . Coronary angioplasty with stent placement      proximcal RCA stent ~ 2001  . Amputation Right 10/14/2013    Procedure: AMPUTATION RAY;  Surgeon: Newt Minion, MD;  Location: [facility];  Service: Orthopedics;  Laterality: Right;  Right 2nd Ray Amputation, Theraskin Graft Right Ankle    Assessment & Plan Clinical Impression:   [name] is a 58 y.o. 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 She was admitted on 03/05/14 with hypotension, tachycardia, fever of 101.9, leucocytosis and significant Left flank swelling. She was started on IV antibiotics for sepsis and was found to have left inguinal hematoma on CT. Patient was noted to have positive troponin's due to NSTEMI and started on IV heparin with serial CBC for monitoring. Right heel culture positive for pseudomonas and BC X 2 positive for MSSA. TEE negative for thrombus or vegetations. Hospital course complicated by respiratory failure requiring intubation on 09/14 to 9/19 and reintubation 09/21 to 9/23. She underwent cardiac cath on 09/18 by Dr. Irish Lack revealing severe 3 vessel CAD with EF 10-15%. She was not felt to be a surgical candidate per Dr. Prescott Gum. CAD treated medically and acute  on chronic systolic CHF treated with diuretics with clinical improvement.   Left transudate effusion tapped for 500 cc clear fluid. Speech therapy following for treatment of dysphagia with s/s of aspiration. MBS done on 09/27 revealing severe pharyngeal dysphagia and NPO recommended due to silent aspiration. She underwent PCI of RCA and LAD on 03/22/14 by Dr. Burt Knack with recommendations for ASA and Plavix for  lifelong duration. She has completed 3 week course of antibiotics for MSSA bacteremia as well as pseudomonas positive right groin wound. Patient with improvement in hoarseness and FEES done on 10/01 revealing shallow penetration of honey with ability to cough and diet initiated and advanceto Dysphagia 3, honey liquids by tsp. Medications being adjusted due to low blood pressure She is showing improvement in mobility with improvement in activity tolerance.   Patient transferred to CIR on 03/27/2014 .    Patient currently requires min with basic self-care skills secondary to muscle weakness, decreased cardiorespiratoy endurance and decreased standing balance and decreased postural control.  Prior to hospitalization, patient was fully independent, driving, working full time.  Patient will benefit from skilled intervention to increase independence with basic self-care skills and increase level of independence with iADL prior to discharge home with care partner.  Anticipate patient will require intermittent supervision and follow up home health.  OT - End of Session Activity Tolerance: Tolerates 10 - 20 min activity with multiple rests Endurance Deficit: Yes Endurance Deficit Description: Pt needed seated rest between each task of 38min in sitting/standing OT Assessment Rehab Potential: Excellent OT Patient demonstrates impairments in the following area(s): Balance;Endurance;Motor;Sensory;Pain OT Basic ADL's Functional Problem(s): Grooming;Bathing;Toileting;Dressing OT Advanced ADL's Functional Problem(s): Light Housekeeping OT Transfers Functional Problem(s): Toilet;Tub/Shower OT Additional Impairment(s): None OT Plan OT Intensity: Minimum of 1-2 x/day, 45 to 90 minutes OT Frequency: 5 out of 7 days OT Duration/Estimated Length of Stay: 12-14 days OT Treatment/Interventions: Balance/vestibular training;Discharge planning;DME/adaptive equipment instruction;Functional mobility training;Pain  management;Patient/family education;Self Care/advanced ADL retraining;Therapeutic Activities;Therapeutic Exercise;UE/LE Strength taining/ROM OT Self Feeding Anticipated Outcome(s): I OT Basic Self-Care Anticipated Outcome(s): mod I with dressing, supervision with bathing OT Toileting Anticipated Outcome(s): mod I OT Bathroom Transfers Anticipated Outcome(s): mod I to access toilet with RW, supervision to shower with RW OT Recommendation Patient destination: Home Follow Up Recommendations: Home health OT Equipment Recommended: None recommended by OT   Skilled Therapeutic Intervention  Pt seen for initial evaluation and ADL retraining of bathing and dressing. Discussed OT POC and pt's goals. Pt is not able to shower at this time, so she engaged in sponge bath at the sink. She only needs min A with her LB self care due to difficulty reaching towards her feet and severe LE weakness with standing. Pt did need several rest breaks, but overall participated extremely well.  She worked on w/c to toilet transfers by pushing up from chair with one hand with other hand on grab bar with min A. Reviewed pressure relief techniques with weight shifts and push ups and overall UE and LE AROM exercises to facilitate general activity tolerance. Pt provided with a honey thick juice, reviewed swallowing precautions. Pt is fully aware. Pt resting in chair at end of session in her room with call light in reach.   OT Evaluation Precautions/Restrictions  Precautions Precautions: Fall Precaution Comments: fell 9/28 with nursing (missed EOB when sitting from stedy) Restrictions Weight Bearing Restrictions: No   Vital Signs Therapy Vitals Pulse Rate: 63 BP: 101/43 mmHg Patient Position (if appropriate): Sitting Oxygen Therapy SpO2: 98 % O2 Device:  None (Room air) Pain Pain Assessment Pain Assessment: 0-10 Pain Score: 8  Home Living/Prior Functioning Home Living Family/patient expects to be discharged to::  Private residence Living Arrangements: Alone Available Help at Discharge: Family;Available 24 hours/day Type of Home: House Home Access: Level entry Home Layout: One level Additional Comments: Son lives next door, not working and can help at [place] (son has 2 steps with 2 rails)  Lives With: Alone Prior Function Level of Independence: Independent with basic ADLs;Independent with gait;Independent with transfers;Independent with homemaking with ambulation  Able to Take Stairs?: Yes Driving: Yes Vocation: Full time employment ADL ADL ADL Comments: Refer to FIM Vision/Perception  Vision- History Baseline Vision/History: Wears glasses Wears Glasses: Reading only Patient Visual Report: No change from baseline Vision- Assessment Vision Assessment?: No apparent visual deficits Perception Comments: WFL  Cognition Overall Cognitive Status: Within Functional Limits for tasks assessed Arousal/Alertness: Awake/alert Orientation Level: Oriented X4 Sensation Sensation Light Touch: Impaired Detail Light Touch Impaired Details: Impaired RLE;Impaired LLE Stereognosis: Appears Intact Hot/Cold: Appears Intact Proprioception: Appears Intact Additional Comments: Decreased light touch toes>distal knees bil. Skin darkening, decreased perfusion, scars/wounds. Coordination Gross Motor Movements are Fluid and Coordinated: Yes Fine Motor Movements are Fluid and Coordinated: Yes Motor  Motor Motor - Skilled Clinical Observations: Generalized muscle weakness in BLE Mobility    mod A with transfers with RW, min to toilet with grab bars Trunk/Postural Assessment  Cervical Assessment Cervical Assessment: Within Functional Limits Thoracic Assessment Thoracic Assessment: Within Functional Limits Lumbar Assessment Lumbar Assessment: Within Functional Limits Postural Control Postural Control: Deficits on evaluation  Balance Balance Balance Assessed: Yes Dynamic Sitting Balance Sitting balance -  Comments: able to sit without UE support Static Standing Balance Static Standing - Balance Support: Bilateral upper extremity supported;During functional activity Static Standing - Level of Assistance: 4: Min assist Static Standing - Comment/# of Minutes: 20min with RW Dynamic Standing Balance Dynamic Standing - Balance Support: Bilateral upper extremity supported;During functional activity Dynamic Standing - Level of Assistance: 3: Mod assist Dynamic Standing - Balance Activities: Lateral lean/weight shifting;Forward lean/weight shifting;Reaching for objects;Reaching across midline Dynamic Standing - Comments: 18min Extremity/Trunk Assessment RUE Assessment RUE Assessment: Within Functional Limits LUE Assessment LUE Assessment: Within Functional Limits  FIM:  FIM - Grooming Grooming Steps: Wash, rinse, dry face;Wash, rinse, dry hands;Oral care, brush teeth, clean dentures;Brush, comb hair Grooming: 5: Set-up assist to obtain items FIM - Bathing Bathing Steps Patient Completed: Chest;Right Arm;Left Arm;Abdomen;Front perineal area;Buttocks;Right upper leg;Left upper leg Bathing: 4: Min-Patient completes 8-9 23f 10 parts or 75+ percent FIM - Upper Body Dressing/Undressing Upper body dressing/undressing steps patient completed: Thread/unthread right sleeve of pullover shirt/dresss;Thread/unthread left sleeve of pullover shirt/dress;Put head through opening of pull over shirt/dress;Pull shirt over trunk Upper body dressing/undressing: 5: Set-up assist to: Obtain clothing/put away FIM - Lower Body Dressing/Undressing Lower body dressing/undressing steps patient completed: Thread/unthread right underwear leg;Thread/unthread left underwear leg;Pull underwear up/down;Thread/unthread right pants leg;Thread/unthread left pants leg;Pull pants up/down Lower body dressing/undressing: 4: Min-Patient completed 75 plus % of tasks FIM - Toileting Toileting steps completed by patient: Performs perineal  hygiene Toileting Assistive Devices: Grab bar or rail for support Toileting: 0: Activity did not occur FIM - Control and instrumentation engineer Devices: Environmental consultant;Arm rests Bed/Chair Transfer: 3: Bed > Chair or W/C: Mod A (lift or lower assist);2: Chair or W/C > Bed: Max A (lift and lower assist) FIM - Radio producer Devices: Elevated toilet seat;Grab bars Toilet Transfers: 4-From toilet/BSC: Min A (steadying Pt. > 75%);4-To toilet/BSC:  Min A (steadying Pt. > 75%) FIM - Tub/Shower Transfers Tub/shower Transfers: 0-Activity did not occur or was simulated   Refer to Care Plan for Long Term Goals  Recommendations for other services: None  Discharge Criteria: Patient will be discharged from OT if patient refuses treatment 3 consecutive times without medical reason, if treatment goals not met, if there is a change in medical status, if patient makes no progress towards goals or if patient is discharged from hospital.  The above assessment, treatment plan, treatment alternatives and goals were discussed and mutually agreed upon: by patient  [place] 03/28/2014, 11:44 AM

## 2014-03-28 NOTE — Progress Notes (Signed)
58 y.o. 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 She was admitted on 03/05/14 with hypotension, tachycardia, fever of 101.9, leucocytosis and significant Left flank swelling. She was started on IV antibiotics for sepsis and was found to have left inguinal hematoma on CT. Patient was noted to have positive troponin's due to NSTEMI and started on IV heparin with serial CBC for monitoring. Right heel culture positive for pseudomonas and BC X 2 positive for MSSA. TEE negative for thrombus or vegetations. Hospital course complicated by respiratory failure requiring intubation on 09/14 to 9/19 and reintubation 09/21 to 9/23. She underwent cardiac cath on 09/18 by Dr. Irish Lack revealing severe 3 vessel CAD with EF 10-15%. She was not felt to be a surgical candidate per Dr. Prescott Gum. CAD treated medically and acute on chronic systolic CHF treated with diuretics with clinical improvement.   Left transudate effusion tapped for 500 cc clear fluid. Speech therapy following for treatment of dysphagia with s/s of aspiration. MBS done on 09/27 revealing severe pharyngeal dysphagia and NPO recommended due to silent aspiration. She underwent PCI of RCA and LAD on 03/22/14 by Dr. Burt Knack with recommendations for ASA and Plavix for lifelong duration. She has completed 3 week course of antibiotics for MSSA bacteremia as well as pseudomonas positive right groin wound. Patient with improvement in hoarseness and FEES done on 10/01 revealing shallow penetration of honey with ability to cough and diet initiated and advanceto Dysphagia 3, honey liquids by tsp  Subjective/Complaints: No CP or SOB, No N/V no diarrhea or constipation, urinating ok  Objective: Vital Signs: Blood pressure 108/60, pulse 63, temperature 98.5 F (36.9 C), temperature source Oral, resp. rate 18, weight 74.39 kg  (164 lb), SpO2 98.00%. No results found. Results for orders placed during the hospital encounter of 03/27/14 (from the past 72 hour(s))  GLUCOSE, CAPILLARY     Status: Abnormal   Collection Time    03/27/14  4:20 PM      Result Value Ref Range   Glucose-Capillary 187 (*) 70 - 99 mg/dL  GLUCOSE, CAPILLARY     Status: Abnormal   Collection Time    03/27/14 10:19 PM      Result Value Ref Range   Glucose-Capillary 242 (*) 70 - 99 mg/dL  CBC WITH DIFFERENTIAL     Status: Abnormal   Collection Time    03/28/14  5:48 AM      Result Value Ref Range   WBC 4.5  4.0 - 10.5 K/uL   RBC 4.35  3.87 - 5.11 MIL/uL   Hemoglobin 10.6 (*) 12.0 - 15.0 g/dL   HCT 34.4 (*) 36.0 - 46.0 %   MCV 79.1  78.0 - 100.0 fL   MCH 24.4 (*) 26.0 - 34.0 pg   MCHC 30.8  30.0 - 36.0 g/dL   RDW 21.6 (*) 11.5 - 15.5 %   Platelets 214  150 - 400 K/uL   Neutrophils Relative % 51  43 - 77 %   Lymphocytes Relative 37  12 - 46 %   Monocytes Relative 10  3 - 12 %   Eosinophils Relative 2  0 - 5 %   Basophils Relative 0  0 - 1 %   Neutro Abs 2.2  1.7 - 7.7 K/uL   Lymphs Abs 1.7  0.7 - 4.0 K/uL   Monocytes Absolute  0.5  0.1 - 1.0 K/uL   Eosinophils Absolute 0.1  0.0 - 0.7 K/uL   Basophils Absolute 0.0  0.0 - 0.1 K/uL   RBC Morphology ELLIPTOCYTES    COMPREHENSIVE METABOLIC PANEL     Status: Abnormal   Collection Time    03/28/14  5:48 AM      Result Value Ref Range   Sodium 139  137 - 147 mEq/L   Potassium 4.4  3.7 - 5.3 mEq/L   Chloride 103  96 - 112 mEq/L   CO2 27  19 - 32 mEq/L   Glucose, Bld 192 (*) 70 - 99 mg/dL   BUN 13  6 - 23 mg/dL   Creatinine, Ser 0.49 (*) 0.50 - 1.10 mg/dL   Calcium 8.9  8.4 - 10.5 mg/dL   Total Protein 6.5  6.0 - 8.3 g/dL   Albumin 2.7 (*) 3.5 - 5.2 g/dL   AST 22  0 - 37 U/L   ALT 16  0 - 35 U/L   Alkaline Phosphatase 62  39 - 117 U/L   Total Bilirubin 0.3  0.3 - 1.2 mg/dL   GFR calc non Af Amer >90  >90 mL/min   GFR calc Af Amer >90  >90 mL/min   Comment: (NOTE)     The eGFR  has been calculated using the CKD EPI equation.     This calculation has not been validated in all clinical situations.     eGFR's persistently <90 mL/min signify possible Chronic Kidney     Disease.   Anion gap 9  5 - 15  GLUCOSE, CAPILLARY     Status: Abnormal   Collection Time    03/28/14  7:10 AM      Result Value Ref Range   Glucose-Capillary 149 (*) 70 - 99 mg/dL      Head: Normocephalic and atraumatic.  Eyes: Pupils are equal, round, and reactive to light.  Neck: Neck supple.  Dry dressing right neck--site of prior central line. Non tender Cardiovascular: Normal rate. Normal rhythm. No murmurs  Respiratory: Effort normal and breath sounds normal. No respiratory distress. She has no wheezes.  GI: Soft. Bowel sounds are normal.  Musculoskeletal: She exhibits no edema and no tenderness. BLE with evidence of muscle atrophy. 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 Externally rotated, pes planus, Right 2nd toe amp Neurological: She is alert and oriented to person, place, and time.  Strong voice without dysphonia. Follows basic commands without difficulty. UE: 4/5 proximal to distal. LE: 2+ HF, 3 KE, 4/5 ankle. Decreased PP/LT below knees bilaterally below knees.  Skin: Dry ulcerated areas bilateral great toes. Right lateral malleolus with stage 2 ulcer. Multiple dry scabbed areas on top of foot. Bilateral shins with chronic vascular changes. Resolving ecchymosis right wrist . Sacrum with budding yeast on coccyx as well as diffuse peeling intergluteal area. Right inguinal wound healing with 2cm area. Marland Kitchen  Psychiatric: She has a normal mood and affect. Her behavior is normal. Judgment and thought content normal.    Assessment/Plan: 1. Functional deficits secondary to deconditioning after multiple medical issues/sepsis/respiratory failure   which require 3+ hours per day of interdisciplinary therapy in a comprehensive inpatient rehab setting. Physiatrist is providing close  team supervision and 24 hour management of active medical problems listed below. Physiatrist and rehab team continue to assess barriers to discharge/monitor patient progress toward functional and medical goals. FIM:  Expression Expression Mode: Verbal Expression: 4-Expresses basic 75 - 89% of the time/requires cueing 10 - 24% of the time. Needs helper to occlude trach/needs to repeat words.  Social Interaction Social Interaction: 5-Interacts appropriately 90% of the time - Needs monitoring or encouragement for participation or interaction.  Problem Solving Problem Solving: 4-Solves basic 75 - 89% of the time/requires cueing 10 - 24% of the time  Memory Memory: 4-Recognizes or recalls 75 - 89% of the time/requires cueing 10 - 24% of the time  Medical Problem List and Plan:   1. Functional deficits secondary to deconditioning after multiple medical issues/sepsis/respiratory failure   2. DVT Prophylaxis/Anticoagulation: Pharmaceutical: Lovenox   3. Chronic pain/Pain Management: continue Neurontin tid. Used fentanyl 100 mg as well as oxycodone 30 mg prn at home for chronic bilateral knee as well as neuropathic foot pain.   4. Mood: team to provide ego support. Continue Lexapro. LCSW to follow for support and evaluation.   5. Neuropsych: This patient is capable of making decisions on her own behalf.   6. Skin/Wound Care: routine pressure relief measures. Monitor left groin daily.   7. Fluids/Electrolytes/Nutrition: Monitor I/O.   8. NSTEMI s/p PCI RCA, CAD and CFX DES: Continue ASA/Plavix. On Imdur, bisoprolol and Lipitor.   9. Acute systolic heart failure: Monitor daily weights and for other signs of overload. Continue Zebeta, lasix 40 mg/day, Cozaar 25 mg/day and aldactone 12. 63m /day. Monitor for tolerance of medications.   10 DM type 2 with neuropathy: Hgb A1c- 7.2. Will monitor BS with ac/hs checks. Continue lantus insulin 20 units daily. SSI for elevated  BS.   11. Right inguinal infection: Right thigh with dime size area with dry dressing.   12. ABLA due to left groin hematoma: H/H improving. Will monitor for stability. Recheck in am.   13. Dysphagia: Diet advanced to Dysphagia 3 texturesand should help po intake. Continue honey liquids and push po fluids to maintain adequate hydration.   14. Hypotension: Monitor blood pressures every 8 hours and for signs orthostatic signs.   15. Severe protein malnutrition: Albumin-2.4. Continue ensure supplements bid.   16. AECOPD: Resolved--taper off prednisone.   17. Candida sacral/perineal area: will treat with diflucan   LOS (Days) 1 A FACE TO FACE EVALUATION WAS PERFORMED  KIRSTEINS,ANDREW E 03/28/2014, 7:55 AM

## 2014-03-28 NOTE — Progress Notes (Addendum)
Inpatient Diabetes Program Recommendations  AACE/ADA: New Consensus Statement on Inpatient Glycemic Control (2013)  Target Ranges:  Prepandial:   less than 140 mg/dL      Peak postprandial:   less than 180 mg/dL (1-2 hours)      Critically ill patients:  140 - 180 mg/dL    Inpatient Diabetes Program Recommendations Insulin - Basal: Fasting glucose levels are controlled at 20 units per day. Correction (SSI): The correction is not covering the meals, as post-meal cbg's are high.  Please decrease to moderate correction tidwc and the HS scale and start meal coverage per below Insulin - Meal Coverage: Add 4 units novolog meal coverage tidwc  Thank you, Lenor CoffinAnn Lachina Salsberry, RN, CNS, Diabetes Coordinator [phone])

## 2014-03-28 NOTE — Progress Notes (Signed)
Kaitlin Oyster, MD Physician Signed Physical Medicine and Rehabilitation Consult Note Service date: 03/20/2014 8:43 AM  Related encounter: Admission (Discharged) from 03/05/2014 in [facility] 3E CHF           Physical Medicine and Rehabilitation Consult Reason for Consult: Deconditioning due to sepsis.   Referring Physician: Dr. Susie Cassette.      HPI: [name] is a 58 y.o. 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 She was admitted on 03/05/14 with hypotension, tachycardia,  fever of 101.9, leucocytosis and significant Left flank swelling. She was started on IV antibiotics for sepsis and was found to have left inguinal hematoma on CT. Patient was noted to have positive troponin's due to NSTEMI and started on IV heparin with serial CBC for monitoring.  Right heel culture positive for pseudomonas and BC X 2 positive for MSSA. TEE negative for thrombus or vegetations. Hospital course complicated by respiratory failure requiring intubation on 09/14 to 9/19 and reintubation 09/21 to 9/23. She underwent cardiac cath on 09/18 by Dr. Eldridge Dace revealing severe 3 vessel CAD with EF 10-15%. She was not felt to be a surgical candidate per Dr. Donata Clay. CAD treated medically and acute on chronic systolic CHF treated with diuretics with clinical improvement. Left transudate effusion tapped for 500 cc clear fluid.  Speech therapy following for treatment of dysphagia with s/s of aspiration. MBS done on 09/27 revealing severe pharyngeal dysphagia and NPO recommended due to silent aspiration. PT evaluation done on 09/25 and patient noted to be severely deconditioned. MD and therapy team recommending CIR for progressive therapy.       Review of Systems  Eyes: Negative for blurred vision and double vision.  Respiratory: Negative for cough,  shortness of breath and wheezing.   Cardiovascular: Negative for chest pain and palpitations.  Gastrointestinal: Negative for heartburn and abdominal pain.  Musculoskeletal: Negative for myalgias.  Neurological: Positive for weakness. Negative for dizziness and tingling.  Psychiatric/Behavioral: The patient is not nervous/anxious and does not have insomnia.        Past Medical History   Diagnosis  Date   .  Diabetes mellitus     .  Peripheral vascular disease     .  Hyperlipidemia     .  Obesity     .  Hypertension     .  Arthritis     .  Ulcer of ankle         right   .  Coronary artery disease     .  COPD (chronic obstructive pulmonary disease)     .  NSTEMI (non-ST elevated myocardial infarction)         Past Surgical History   Procedure  Laterality  Date   .  Cholecystectomy       .  Femoral-tibial bypass graft  Right  09/27/2013       Procedure: BYPASS GRAFT FEMORAL-TIBIAL ARTERY-RIGHT;  Surgeon: Nada Libman, MD;  Location: [facility] OR;  Service: Vascular;  Laterality: Right;   .  Abdominal aorta stent  Right     .  Bypass graft popliteal to tibial  Right     .  Coronary angioplasty with stent placement           proximcal RCA stent ~ 2001   .  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  Cancer  Mother     .  Heart disease  Father     .  Hyperlipidemia  Father     .  Hypertension  Father     .  Other  Father         varicose veins   .  Heart attack  Father     .  Heart disease  Sister     .  Hyperlipidemia  Sister     .  Hypertension  Sister     .  Other  Sister         varicose veins   .  Heart attack  Sister     .  Heart disease  Brother     .  Hypertension  Brother     .  Hyperlipidemia  Brother     .  Other  Brother         varicose  veins   .  Heart attack  Brother        Social History:  Lives alone. Independent and working full time for [employer]. Son lives next door and has a supportive family that can check in past discharge.   She reports that she has been smoking Cigarettes--1 PPD.  She has a 30 pack-year smoking history. She has never used smokeless tobacco. She reports that she does not drink alcohol or use illicit drugs.      Allergies   Allergen  Reactions   .  Penicillins  Hives       Tolerates cefepime   .  Adhesive [Tape]  Rash       Medications Prior to Admission   Medication  Sig  Dispense  Refill   .  alprazolam (XANAX) 2 MG tablet  Take 1 mg by mouth every 6 (six) hours as needed for sleep or anxiety.          Marland Kitchen  aspirin EC 325 MG tablet  Take 325 mg by mouth daily.         Marland Kitchen  atorvastatin (LIPITOR) 20 MG tablet  Take 20 mg by mouth daily.         Marland Kitchen  escitalopram (LEXAPRO) 10 MG tablet  Take 10 mg by mouth daily.         .  fentaNYL (DURAGESIC - DOSED MCG/HR) 100 MCG/HR  Place 100 mcg onto the skin every 3 (three) days.         .  fluticasone (FLONASE) 50 MCG/ACT nasal spray  Place 1 spray into both nostrils daily as needed for allergies.          Marland Kitchen  gabapentin (NEURONTIN) 600 MG tablet  Take 600 mg by mouth 3 (three) times daily.         Marland Kitchen  glimepiride (AMARYL) 2 MG tablet  Take 2 mg by mouth 2 (two) times daily.         .  hydrochlorothiazide (MICROZIDE) 12.5 MG capsule  Take 12.5 mg by mouth daily.         .  meloxicam (MOBIC) 15 MG tablet  Take 15 mg by mouth daily.         Marland Kitchen  metFORMIN (GLUCOPHAGE) 500 MG tablet  Take 1,000 mg by mouth 2 (two) times daily with a meal.         .  oxycodone (ROXICODONE) 30 MG immediate release tablet  Take 30 mg by mouth every 4 (four) hours as needed for pain.          Marland Kitchen  quinapril (ACCUPRIL) 20 MG tablet  Take 20 mg by mouth daily.         .  VESICARE 5 MG tablet  Take 5 mg by mouth daily.             Home: Home Living Family/patient expects to be  discharged to:: Private residence Living Arrangements: Alone;Children Available Help at Discharge: Family;Available 24 hours/day Type of Home: House Home Access: Level entry Home Layout: One level Home Equipment: Walker - 2 wheels;Shower seat;Cane - single point;Adaptive equipment Adaptive Equipment: Reacher   Functional History: Prior Function Level of Independence: Independent Functional Status:   Mobility: Bed Mobility Overal bed mobility: Needs Assistance;+2 for physical assistance Bed Mobility: Supine to Sit Supine to sit: Mod assist;+2 for physical assistance General bed mobility comments: Pt needed assist to move LEs off bed and for elevation of trunk. Transfers Overall transfer level: Needs assistance Equipment used: 2 person hand held assist Transfers: Sit to/from [employer] Sit to Stand: Mod assist;+2 physical assistance Stand pivot transfers: Mod assist;+2 physical assistance General transfer comment: Needed assist with pad to achieve stand and pt unable to achieve full upright.  Took two attempts to stand enough to pivot to chair.  Pt with weak hip extensors and trunk extension.     ADL:   Cognition: Cognition Overall Cognitive Status: Within Functional Limits for tasks assessed Orientation Level: Oriented X4 Cognition Arousal/Alertness: Awake/alert Behavior During Therapy: WFL for tasks assessed/performed Overall Cognitive Status: Within Functional Limits for tasks assessed   Blood pressure 107/42, pulse 65, temperature 97.9 F (36.6 C), temperature source Oral, resp. rate 17, height 5\' 2"  (1.575 m), weight 89.5 kg (197 lb 5 oz), SpO2 95.00%. Physical Exam  Nursing note and vitals reviewed. Constitutional: She is oriented to person, place, and time. She appears well-developed and well-nourished. She has a sickly appearance. Nasal cannula in place.  Panda in place  HENT:   Head: Normocephalic and atraumatic.  Eyes: Pupils are equal, round,  and reactive to light.  Neck: Neck supple.  Central line left neck.   Cardiovascular: Normal rate.   Respiratory: Effort normal and breath sounds normal. No respiratory distress. She has no wheezes.  GI: Soft. Bowel sounds are normal.  Musculoskeletal: She exhibits no edema and no tenderness.  Neurological: She is alert and oriented to person, place, and time.  Strong voice but dysphonia noted. Follows basic commands without difficulty.    Skin: Skin is warm and dry.  Dry ulcerated areas bilateral great toes. Right lateral malleolus with stage 2 ulcer.  Psychiatric: She has a normal mood and affect. Her behavior is normal. Judgment and thought content normal.     Results for orders placed during the hospital encounter of 03/05/14 (from the past 24 hour(s))   BASIC METABOLIC PANEL     Status: Abnormal     Collection Time      03/19/14  9:01 AM       Result  Value  Ref Range     Sodium  141   137 - 147 mEq/L     Potassium  3.8   3.7 - 5.3 mEq/L  Chloride  102   96 - 112 mEq/L     CO2  28   19 - 32 mEq/L     Glucose, Bld  226 (*)  70 - 99 mg/dL     BUN  16   6 - 23 mg/dL     Creatinine, Ser  [phone] (*)  0.50 - 1.10 mg/dL     Calcium  8.0 (*)  8.4 - 10.5 mg/dL     GFR calc non Af Amer  >90   >90 mL/min     GFR calc Af Amer  >90   >90 mL/min     Anion gap  11   5 - 15   GLUCOSE, CAPILLARY     Status: Abnormal     Collection Time      03/19/14  9:10 AM       Result  Value  Ref Range     Glucose-Capillary  234 (*)  70 - 99 mg/dL   GLUCOSE, CAPILLARY     Status: Abnormal     Collection Time      03/19/14  2:02 PM       Result  Value  Ref Range     Glucose-Capillary  282 (*)  70 - 99 mg/dL   GLUCOSE, CAPILLARY     Status: Abnormal     Collection Time      03/19/14  5:12 PM       Result  Value  Ref Range     Glucose-Capillary  267 (*)  70 - 99 mg/dL   GLUCOSE, CAPILLARY     Status: Abnormal     Collection Time      03/19/14  8:26 PM       Result  Value  Ref Range      Glucose-Capillary  182 (*)  70 - 99 mg/dL   GLUCOSE, CAPILLARY     Status: Abnormal     Collection Time      03/20/14 12:47 AM       Result  Value  Ref Range     Glucose-Capillary  178 (*)  70 - 99 mg/dL   COMPREHENSIVE METABOLIC PANEL     Status: Abnormal     Collection Time      03/20/14  5:00 AM       Result  Value  Ref Range     Sodium  144   137 - 147 mEq/L     Potassium  3.7   3.7 - 5.3 mEq/L     Chloride  106   96 - 112 mEq/L     CO2  29   19 - 32 mEq/L     Glucose, Bld  185 (*)  70 - 99 mg/dL     BUN  17   6 - 23 mg/dL     Creatinine, Ser  [phone] (*)  0.50 - 1.10 mg/dL     Calcium  7.9 (*)  8.4 - 10.5 mg/dL     Total Protein  5.5 (*)  6.0 - 8.3 g/dL     Albumin  2.3 (*)  3.5 - 5.2 g/dL     AST  17   0 - 37 U/L     ALT  16   0 - 35 U/L     Alkaline Phosphatase  47   39 - 117 U/L     Total Bilirubin  0.3   0.3 - 1.2 mg/dL     GFR calc non  Af Amer  >90   >90 mL/min     GFR calc Af Amer  >90   >90 mL/min     Anion gap  9   5 - 15   CARBOXYHEMOGLOBIN     Status: Abnormal     Collection Time      03/20/14  5:10 AM       Result  Value  Ref Range     Total hemoglobin  9.6 (*)  12.0 - 16.0 g/dL     O2 Saturation  16.1        Carboxyhemoglobin  1.6 (*)  0.5 - 1.5 %     Methemoglobin  0.6   0.0 - 1.5 %   GLUCOSE, CAPILLARY     Status: Abnormal     Collection Time      03/20/14  5:10 AM       Result  Value  Ref Range     Glucose-Capillary  191 (*)  70 - 99 mg/dL    Dg Swallowing Func-speech Pathology   03/19/2014   Carolan Shiver, CCC-SLP     03/19/2014  4:52 PM Objective Swallowing Evaluation: Modified Barium Swallowing Study   Patient Details  Name: CHASTY RANDAL MRN: [id number] Date of Birth: 05-27-1956  Today's Date: 03/19/2014 Time: 1530-1600 SLP Time Calculation (min): 30 min  Past Medical History:  Past Medical History  Diagnosis Date  . Diabetes mellitus   . Peripheral vascular disease   . Hyperlipidemia   . Obesity   . Hypertension   . Arthritis   . Ulcer of ankle      right  . Coronary artery disease   . COPD (chronic obstructive pulmonary disease)   . NSTEMI (non-ST elevated myocardial infarction)    Past Surgical History:  Past Surgical History  Procedure Laterality Date  . Cholecystectomy    . Femoral-tibial bypass graft Right 09/27/2013    Procedure: BYPASS GRAFT FEMORAL-TIBIAL ARTERY-RIGHT;  Surgeon:  Nada Libman, MD;  Location: [facility] OR;  Service: Vascular;   Laterality: Right;  . Abdominal aorta stent Right   . Bypass graft popliteal to tibial Right   . Coronary angioplasty with stent placement      proximcal RCA stent ~ 2001  . Amputation Right 10/14/2013    Procedure: AMPUTATION RAY;  Surgeon: Nadara Mustard, MD;   Location: [facility] OR;  Service: Orthopedics;  Laterality: Right;   Right 2nd Ray Amputation, [place]in Graft Right Ankle   HPI:  58 year old female with h/o COPD, DM, peripheral vascular  disease, admitted worsening left lower extremity pain and  and  abdominal pain, developed acute hypoxic respiratory failure due  to pulmonary edema requiring intubation 9/14-9/21, extubated and  reintubated 9/21-9/23, NSTEMI, MSSA bacteremia after recent angiogram. FEES on 9/24 revealed severe acute reversible  dysphagia characterized by intubation related sensory deficits,  generalized weakness, and moderately severe laryngeal and  pharyngeal edema resulting in a delayed swallow initiation and  decreased laryngeal closure. Deep penetration and aspiration with  inconsistent sensation of all consistencies.  NG placed.  MBS  today to determine any potential for conservative POs.       Assessment / Plan / Recommendation Clinical Impression  Dysphagia Diagnosis: Severe pharyngeal phase dysphagia Clinical impression: Pt presents with persisting severe  pharyngeal phase dysphagia related primarily to impaired  laryngeal function during the swallow.  Pt continued to present  with sensory deficits, delayed onset of swallow response, and  penetration of nectar and honey-thick liquids  posteriorly into  larynx.  These consistencies were then aspirated and coursed down  posterior tracheal wall.  Occasionally, a cough response was  elicited, but aspiration was generally silent.  Purees were not  observed to enter larynx; however, shoulder position obfuscated  view during portion of study. Pt with marginal gains but is not  yet safe to begin a PO diet.   Recommend: 1) continue NPO with NG feeds; 2) begin therapeutic  trials of purees with SLP only;  3) therapeutic exercise to target laryngeal strengthening; 4)  repeat FEES in 3-5 days.      Treatment Recommendation  Therapy as outlined in treatment plan below    Diet Recommendation NPO;Alternative means - temporary;Ice chips  PRN after oral care   Medication Administration: Via alternative means    Other  Recommendations Oral Care Recommendations: Oral care prior  to ice chips   Follow Up Recommendations       Frequency and Duration min 3x week  2 weeks       SLP Swallow Goals     General HPI: 58 year old female with h/o COPD, DM, peripheral  vascular disease, admitted worsening left lower extremity pain  and  and abdominal pain, developed acute hypoxic respiratory  failure due to pulmonary edema requiring intubation 9/14-9/21,  extubated and reintubated 9/21-9/23, NSTEMI, MSSA bacteremia  after recent angiogram. FEES on 9/24 revealed severe acute  reversible dysphagia characterized by intubation related sensory  deficits, generalized weakness, and moderately severe laryngeal  and pharyngeal edema resulting in a delayed swallow initiation  and decreased laryngeal closure. Deep penetration and aspiration  with inconsistent sensation of all consistencies.  NG placed.   MBS today to determine any potential for conservative POs.   Type of Study: Modified Barium Swallowing Study Reason for Referral: Objectively evaluate swallowing function Previous Swallow Assessment: see HPI Diet Prior to this Study: NPO Temperature Spikes Noted: No Respiratory Status:  Nasal cannula History of Recent Intubation: Yes Length of Intubations (days): 7 days Date extubated: 03/15/14 Behavior/Cognition: Alert;Cooperative;Pleasant mood Oral Cavity - Dentition: Adequate natural dentition Oral Motor / Sensory Function: Within functional limits Self-Feeding Abilities: Able to feed self Patient Positioning: Upright in chair Baseline Vocal Quality: Hoarse;Breathy;Low vocal intensity Volitional Cough: Weak Volitional Swallow: Able to elicit Anatomy: Within functional limits (edema visualized on prior  FEES; MBS unable to identify) Pharyngeal Secretions: Not observed secondary MBS    Reason for Referral Objectively evaluate swallowing function   Oral Phase Oral Preparation/Oral Phase Oral Phase: Impaired Oral - Honey Oral - Honey Cup: Lingual pumping Oral - Nectar Oral - Nectar Cup: Lingual pumping Oral - Solids Oral - Puree: Lingual pumping   Pharyngeal Phase Pharyngeal Phase Pharyngeal Phase: Impaired Pharyngeal - Honey Pharyngeal - Honey Teaspoon: Delayed swallow  initiation;Pharyngeal residue - valleculae;Pharyngeal residue -  pyriform sinuses;Trace aspiration;Reduced airway/laryngeal  closure;Premature spillage to pyriform  sinuses;Penetration/Aspiration during swallow Penetration/Aspiration details (honey teaspoon): Material enters  airway, passes BELOW cords without attempt by patient to eject  out (silent aspiration) Pharyngeal - Honey Cup: Delayed swallow initiation;Pharyngeal  residue - valleculae;Pharyngeal residue - pyriform sinuses;Trace  aspiration;Reduced airway/laryngeal closure;Premature spillage to  pyriform sinuses;Penetration/Aspiration during swallow Penetration/Aspiration details (honey cup): Material enters  airway, passes BELOW cords without attempt by patient to eject  out (silent aspiration) Pharyngeal - Nectar Pharyngeal - Nectar Teaspoon: Not tested Pharyngeal - Nectar Cup: Delayed swallow initiation;Pharyngeal  residue - valleculae;Pharyngeal residue - pyriform  sinuses;Trace  aspiration;Reduced airway/laryngeal closure;Premature spillage to  pyriform sinuses;Penetration/Aspiration during swallow Penetration/Aspiration  details (nectar cup): Material enters  airway, passes BELOW cords without attempt by patient to eject  out (silent aspiration) Pharyngeal - Thin Pharyngeal - Ice Chips: Not tested Pharyngeal - Solids Pharyngeal - Puree: Delayed swallow initiation;Pharyngeal residue  - valleculae;Pharyngeal residue - pyriform sinuses;Premature  spillage to valleculae Penetration/Aspiration details (puree): Material enters airway,  remains ABOVE vocal cords and not ejected out  Cervical Esophageal Phase   Amanda L. Samson Frederic, [place] CCC/SLP Pager [phone]               Blenda Mounts Laurice 03/19/2014, 4:50 PM      Assessment/Plan: Diagnosis: deconditioning after multiple medical issues/sepsis/respiratory failure Does the need for close, 24 hr/day medical supervision in concert with the patient's rehab needs make it unreasonable for this patient to be served in a less intensive setting? Yes Co-Morbidities requiring supervision/potential complications: DM, wound care, dysphagia Due to bladder management, bowel management, safety, skin/wound care, disease management, medication administration, pain management and patient education, does the patient require 24 hr/day rehab nursing? Yes Does the patient require coordinated care of a physician, rehab nurse, PT (1-2 hrs/day, 5 days/week), OT (1-2 hrs/day, 5 days/week) and SLP (1-2 hrs/day, 5 days/week) to address physical and functional deficits in the context of the above medical diagnosis(es)? Yes Addressing deficits in the following areas: balance, endurance, locomotion, strength, transferring, bowel/bladder control, bathing, dressing, feeding, grooming and toileting Can the patient actively participate in an intensive therapy program of at least 3 hrs of therapy per day at least 5 days per week? Yes The potential for  patient to make measurable gains while on inpatient rehab is excellent Anticipated functional outcomes upon discharge from inpatient rehab are modified independent and supervision  with PT, modified independent and supervision with OT, modified independent and supervision with SLP. Estimated rehab length of stay to reach the above functional goals is: 10-14 days Does the patient have adequate social supports to accommodate these discharge functional goals? Yes Anticipated D/C setting: Home Anticipated post D/C treatments: HH therapy Overall Rehab/Functional Prognosis: excellent   RECOMMENDATIONS: This patient's condition is appropriate for continued rehabilitative care in the following setting: CIR Patient has agreed to participate in recommended program. Yes Note that insurance prior authorization may be required for reimbursement for recommended care.   Comment: Rehab Admissions Coordinator to follow up.   Thanks,   Kaitlin Oyster, MD, [place] [facility]         03/20/2014    Revision History...     Date/Time User Action   03/20/2014 1:39 PM Kaitlin Oyster, MD Sign   03/20/2014 9:21 AM Jacquelynn Cree, PA-C Share  View Details Report   Routing History...     Date/Time From To Method   03/20/2014 1:39 PM Kaitlin Oyster, MD Kaitlin Oyster, MD In Basket   03/20/2014 1:39 PM Kaitlin Oyster, MD Karleen Hampshire, MD Fax

## 2014-03-28 NOTE — Progress Notes (Signed)
[facility] Admission Coordinator Signed Physical Medicine and Rehabilitation PMR Pre-admission Service date: 03/24/2014 4:03 PM  Related encounter: Admission (Discharged) from 03/05/2014 in [facility]   PMR Admission Coordinator Pre-Admission Assessment  Patient: [name] is an 59 y.o., female  MRN: [id number]  DOB: 04/29/1956  Height: $Remove'5\' 2"'OufKVGO$  (157.5 cm)  Weight: 79.8 kg (175 lb 14.8 oz)  Insurance Information  HMO: PPO: yes PCP: IPA: 80/20: OTHER:  PRIMARY: [place] Policy#: [id number] Subscriber: self  CM Name: Mahlon Gammon, RN Phone#: [phone] Fax#: [phone]  Approval given on 03-27-14 from Kathlee Nations, [place] and Webb Silversmith, case manager will call when she wants updates (typically in one week, tentatively plan on updates due on 13-24-40)  Pre-Cert#: [id number] Employer: [employer]  Benefits: Phone #: [phone] Name: Erskine Speed. Date: 06-23-13 Deduct: $600 (met all) Out of Pocket Max: $2400 (met all) Life Max: unlimited  CIR: 80/20%, pre-auth needed SNF: 80/20%, pre-auth needed. 120 days visit max. Outpatient: 80% Co-Pay: 20%, $30 copay/visit (PT/OT only)  Home Health: 80% Co-Pay: 20%, visit limit based on medical necessity  DME: 80% Co-Pay: 20%  Providers: in network   Emergency Contact Information    Contact Information     Name  Relation  Home  Work  Mobile     Sholes  Sister  [phone]       Maiya, Kates  [phone]          Current Medical History  Patient Admitting Diagnosis: Deconditioning due to sepsis  History of Present Illness: [name] is a 58 y.o. 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 She was admitted on 03/05/14 with hypotension, tachycardia, fever of 101.9, leucocytosis and significant Left flank swelling. She was started on IV antibiotics for  sepsis and was found to have left inguinal hematoma on CT. Patient was noted to have positive troponin's due to NSTEMI and started on IV heparin with serial CBC for monitoring. Right heel culture positive for pseudomonas and BC X 2 positive for MSSA. TEE negative for thrombus or vegetations. Hospital course complicated by respiratory failure requiring intubation on 09/14 to 9/19 and reintubation 09/21 to 9/23. She underwent cardiac cath on 09/18 by Dr. Irish Lack revealing severe 3 vessel CAD with EF 10-15%. She was not felt to be a surgical candidate per Dr. Prescott Gum. CAD treated medically and acute on chronic systolic CHF treated with diuretics with clinical improvement. Left transudate effusion tapped for 500 cc clear fluid. Speech therapy following for treatment of dysphagia with s/s of aspiration. MBS done on 09/27 revealing severe pharyngeal dysphagia and NPO recommended due to silent aspiration. PT evaluation done on 09/25 and patient noted to be severely deconditioned. MD and therapy team recommending CIR for progressive therapy.  Past Medical History    Past Medical History    Diagnosis  Date    .  Diabetes mellitus     .  Peripheral vascular disease     .  Hyperlipidemia     .  Obesity     .  Hypertension     .  Arthritis     .  Ulcer of ankle       right    .  Coronary artery disease     .  COPD (chronic obstructive pulmonary disease)     .  NSTEMI (non-ST elevated myocardial infarction)      Family History  family history includes Cancer in her mother; Heart attack in her brother, father, and sister; Heart disease in her brother, father, and sister; Hyperlipidemia in her brother, father, and sister; Hypertension in her brother, father, and sister; Other in her brother, father, and sister.  Prior Rehab/Hospitalizations: previous home health in April 2015 after infection/hospitalization  Current Medications  Current facility-administered medications:0.9 % sodium chloride infusion, ,  Intravenous, Continuous, Elsie Stain, MD, Last Rate: 10 mL/hr at 03/24/14 0201; acetaminophen (TYLENOL) tablet 650 mg, 650 mg, Oral, Q4H PRN, Cherene Altes, MD; albuterol (PROVENTIL) (2.5 MG/3ML) 0.083% nebulizer solution 2.5 mg, 2.5 mg, Nebulization, Q3H PRN, Raylene Miyamoto, MD, 2.5 mg at 03/09/14 1015  aspirin chewable tablet 81 mg, 81 mg, Oral, Daily, Ripudeep K Rai, MD, 81 mg at 03/27/14 1013; atorvastatin (LIPITOR) tablet 80 mg, 80 mg, Oral, q1800, Ripudeep K Rai, MD, 80 mg at 03/26/14 1414; bisoprolol (ZEBETA) tablet 5 mg, 5 mg, Oral, Daily, Jolaine Artist, MD, 5 mg at 03/27/14 1013; clopidogrel (PLAVIX) tablet 75 mg, 75 mg, Oral, Daily, Ripudeep K Rai, MD, 75 mg at 03/27/14 1013  escitalopram (LEXAPRO) tablet 10 mg, 10 mg, Oral, Daily, Ripudeep K Rai, MD, 10 mg at 03/27/14 1013; feeding supplement (ENSURE COMPLETE) (ENSURE COMPLETE) liquid 237 mL, 237 mL, Oral, BID BM, Dorann Ou, RD; fentaNYL (SUBLIMAZE) injection 12.5-25 mcg, 12.5-25 mcg, Intravenous, Q2H PRN, Wilhelmina Mcardle, MD, 25 mcg at 03/20/14 2119; furosemide (LASIX) tablet 40 mg, 40 mg, Oral, Daily, Cherene Altes, MD, 40 mg at 03/27/14 1013  gabapentin (NEURONTIN) capsule 300 mg, 300 mg, Oral, TID, Ripudeep K Rai, MD, 300 mg at 03/27/14 1013; heparin injection 5,000 Units, 5,000 Units, Subcutaneous, 3 times per day, Jolaine Artist, MD, 5,000 Units at 03/27/14 0512; insulin aspart (novoLOG) injection 0-20 Units, 0-20 Units, Subcutaneous, TID WC, Cherene Altes, MD, 7 Units at 03/27/14 1137  insulin glargine (LANTUS) injection 20 Units, 20 Units, Subcutaneous, Daily, Cherene Altes, MD, 20 Units at 03/27/14 1017; losartan (COZAAR) tablet 25 mg, 25 mg, Oral, QHS, Luke K Kilroy, PA-C; nitroGLYCERIN (NITRODUR - Dosed in mg/24 hr) patch 0.4 mg, 0.4 mg, Transdermal, Q24H, Cherene Altes, MD, 0.4 mg at 03/26/14 1632; ondansetron (ZOFRAN) injection 4 mg, 4 mg, Intravenous, Q6H PRN, Jettie Booze, MD   ondansetron [facility]) tablet 4 mg, 4 mg, Oral, Q6H PRN, Cherene Altes, MD; oxyCODONE (Oxy IR/ROXICODONE) immediate release tablet 5-10 mg, 5-10 mg, Oral, Q4H PRN, Cherene Altes, MD, 10 mg at 03/25/14 1801; predniSONE (DELTASONE) tablet 10 mg, 10 mg, Oral, Q breakfast, Ripudeep K Rai, MD, 10 mg at 03/27/14 0750; [place], , Oral, PRN, Charlynne Cousins, MD  sodium chloride 0.9 % injection 10-40 mL, 10-40 mL, Intracatheter, Q12H, Ripudeep K Rai, MD, 10 mL at 03/24/14 0907; sodium chloride 0.9 % injection 10-40 mL, 10-40 mL, Intracatheter, PRN, Ripudeep K Rai, MD, 10 mL at 03/27/14 0452; spironolactone (ALDACTONE) tablet 12.5 mg, 12.5 mg, Oral, Daily, Luke K Kilroy, PA-C, 12.5 mg at 03/27/14 1013  Patients Current Diet: mechanical soft, honey thick, full supervision, meds crushed in puree, liquid by spoon only, cough after each bite/sip.  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."  Home Assistive Devices / Alice Devices/Equipment: CBG Meter;Eyeglasses  Home Equipment: Gilford Rile - 2 wheels;Shower seat;Cane - single point;Adaptive equipment;Bedside commode  Prior Functional Level  Prior Function  Level of Independence: Independent with assistive device(s)  Current Functional Level    Cognition  Overall Cognitive Status: Within Functional Limits for tasks assessed  Orientation Level: Oriented X4    Extremity Assessment  (includes Sensation/Coordination)      ADLs  Overall ADL's : Needs assistance/impaired  Eating/Feeding: Supervision/ safety;Bed level (ST doing trial feeding with applesauce)  Grooming: Wash/dry hands;Wash/dry face;Oral  care;Brushing hair;Set up;Sitting  Upper Body Bathing: Minimal assitance;Sitting (assisted with back)  Lower Body Bathing: Total assistance;Sit to/from stand  Lower Body Bathing Details (indicate cue type and reason): assist for pericare  Upper Body Dressing : Set up;Sitting  Lower Body Dressing: Set up;Bed level (donned socks)  Lower Body Dressing Details (indicate cue type and reason): pt can cross her foot over her knee in supine to donn and doff her socks  Functional mobility during ADLs: (non ambulatory, transfered with Stedy)  General ADL Comments: Pt with foley removed yesterday, incontinent of urine in bed.    Mobility  Overal bed mobility: Needs Assistance  Bed Mobility: Supine to Sit  Rolling: Supervision  Sidelying to sit: Min guard;HOB elevated  Supine to sit: Supervision  Sit to sidelying: Min guard  General bed mobility comments: with rail and HOB elevated pt able to get self seated EOB with incr time, no physical assist needed, cues on ease of technique.    Transfers  Overall transfer level: Needs assistance  Equipment used: Rolling walker (2 wheeled)  Transfers: Sit to/from Stand  Sit to Stand: Min assist  Stand pivot transfers: Max assist;+2 physical assistance  General transfer comment: sit/stand x3 reps total: x1 from lowest bed position, x2 from recliner chair. all with min assist to power up and cues on technique/ant weight shift to assist with standing.    Ambulation / Gait / Stairs / Wheelchair Mobility  Ambulation/Gait  Ambulation/Gait assistance: Min assist;+2 safety/equipment  Ambulation Distance (Feet): 12 Feet  Assistive device: Rolling walker (2 wheeled)  Gait Pattern/deviations: Step-through pattern;Decreased stride length;Trunk flexed;Wide base of support  Gait velocity: Decreased  Gait velocity interpretation: Below normal speed for age/gender  General Gait Details: cues on posture, walker position and to incr right foot clearance with gait. all gait was  forward (going toward door) with chair follow for safety.    Posture / Balance  Dynamic Sitting Balance  Sitting balance - Comments: pt sat on EOB x 4 minutes    Special needs/care consideration  BiPAP/CPAP no  CPM no  Continuous Drip IV no  Dialysis no  Life Vest no  Oxygen no  Special Bed no  Trach Size no  Wound Vac (area) no  Skin - no issues, recent L IJ line taken out this am 10-5  Bowel mgmt: last BM on 03-26-14, incontinent  Bladder mgmt: using bedpan or BSC with assist  Diabetic mgmt - managed at home with medications    Previous Home Environment  Living Arrangements: Alone (son lives right next door)  Lives With: Alone  Available Help at Discharge: Family;Available 24 hours/day (chart stated pt lives in converted garage, son lives in house)  Type of Home: House  Home Layout: One level  Home Access: Level entry  Bathroom Shower/Tub: Product/process development scientist Toilet: [place]  Services: No  Discharge Living Setting  Plans for Discharge Living Setting: Other (Comment) (plan is to stay at son's house (lives next door to her house)  Type of Home at Discharge: House  Discharge Home Layout: One level  Discharge Home Access: Stairs to enter  Entrance Stairs-Rails: Right  Entrance Stairs-Number of Steps: 2  Does the patient have any problems obtaining your medications?: No  Social/Family/Support Systems  Patient Roles: Other (Comment) (pt was working full time, selling hearing aides @ [employer])  Contact Information: sister is primary contact  Anticipated Caregiver: son is not currently working. Plan is to stay at his house (she lives next door). Sister is also supportive.  Anticipated Caregiver's Contact Information: see above  Ability/Limitations of Caregiver: no limitations for pt's son  Caregiver Availability: 24/7  Discharge Plan Discussed with Primary Caregiver: Yes  Is Caregiver In Agreement with Plan?: Yes  Does Caregiver/Family have Issues with  Lodging/Transportation while Pt is in Rehab?: No  Goals/Additional Needs  Patient/Family Goal for Rehab: Supervision and Mod Ind with PT/OT/SLP  Expected length of stay: 10-14 days  Cultural Considerations: none  Dietary Needs: Mechanical soft, liquids - honey by spoon only, full supervision, meds crushed in puree. cough after each bite/sip.  Equipment Needs: to be determined  Pt/Family Agrees to Admission and willing to participate: Yes (spoke with pt, her sister and son on several occasions)  Program Orientation Provided & Reviewed with Pt/Caregiver Including Roles & Responsibilities: Yes  Decrease burden of Care through IP rehab admission: NA  Possible need for SNF placement upon discharge: not anticipated  Patient Condition: This patient's medical and functional status has changed since the consult dated: 03-20-14 in which the Rehabilitation Physician determined and documented that the patient's condition is appropriate for intensive rehabilitative care in an inpatient rehabilitation facility. See "History of Present Illness" (above) for medical update. Functional changes are: minimal assist of 2 to ambulate 12' and variable assist with seated ADLs. Patient's medical and functional status update has been discussed with the Rehabilitation physician and patient remains appropriate for inpatient rehabilitation. Will admit to inpatient rehab today.  Preadmission Screen Completed By: Nanetta Batty, PT, 03/27/2014 11:41 AM  ______________________________________________________________________  Discussed status with Dr. Naaman Plummer on 03/27/14 at 30 and received telephone approval for admission today.  Admission Coordinator: Nanetta Batty, PT, time1140/Date 03/27/14    Cosigned by: Meredith Staggers, MD [03/27/2014 12:15 PM]

## 2014-03-28 NOTE — Progress Notes (Signed)
Social Work Assessment and Plan Social Work Assessment and Plan  Patient Details  Name: [name] MRN: [id number] Date of Birth: [birth year]  Today's Date: 03/28/2014  Problem List:  Patient Active Problem List   Diagnosis Date Noted  . Physical debility 03/27/2014  . CAD- S/P RCA PCI '00, LAD (x3) and CFX DES 03/22/14 03/26/2014  . ICM- EF 35-40% by echo 03/08/14 03/26/2014  . Acute respiratory failure 03/14/2014  . Acute systolic heart failure 03/14/2014  . NSTEMI (non-ST elevated myocardial infarction)   . Left leg pain- Left inguinal hematoma on CT 03/05/2014  . Anemia 03/05/2014  . Diabetes mellitus, controlled 03/05/2014  . Aftercare following surgery of the circulatory system, NEC 12/05/2013  . Wound drainage 11/22/2013  . Acute osteomyelitis involving lower leg 09/26/2013  . Diabetic foot infection 09/25/2013  . Fever 09/23/2013  . Sepsis 09/23/2013  . PVD- s/p RFPBPG April 2015 09/23/2013  . Varicose veins of lower extremities with other complications 09/07/2013  . Ulcer of lower limb 09/07/2013  . Venous stasis 09/07/2013  . Ulcer of left lower leg 09/29/2012  . Atherosclerosis of native arteries of the extremities with ulceration(440.23) 05/05/2012   Past Medical History:  Past Medical History  Diagnosis Date  . Diabetes mellitus   . Peripheral vascular disease   . Hyperlipidemia   . Obesity   . Hypertension   . Arthritis   . Ulcer of ankle     right  . Coronary artery disease   . COPD (chronic obstructive pulmonary disease)   . NSTEMI (non-ST elevated myocardial infarction)    Past Surgical History:  Past Surgical History  Procedure Laterality Date  . Cholecystectomy    . Femoral-tibial bypass graft Right 09/27/2013    Procedure: BYPASS GRAFT FEMORAL-TIBIAL ARTERY-RIGHT;  Surgeon: Nada LibmanVance W Brabham, MD;  [facility];  Service: Vascular;  Laterality: Right;  . Abdominal aorta stent Right   . Bypass graft popliteal to tibial Right   . Coronary  angioplasty with stent placement      proximcal RCA stent ~ 2001  . Amputation Right 10/14/2013    Procedure: AMPUTATION RAY;  Surgeon: Nadara MustardMarcus V Duda, MD;  [facility];  Service: Orthopedics;  Laterality: Right;  Right 2nd Ray Amputation, [place] Graft Right Ankle   Social History:  reports that she quit smoking about 2 weeks ago. Her smoking use included Cigarettes. She has a 60 pack-year smoking history. She has never used smokeless tobacco. She reports that she does not drink alcohol or use illicit drugs.  Family / Support Systems Marital Status: Widow/Widower Patient Roles: Parent;Other (Comment) (Employee) Children: Manning CharityDavid Verno-son  [phone]-cell Other Supports: Ammie DaltonMinnie Earles-sister  [phone] Anticipated Caregiver: david Ability/Limitations of Caregiver: Son has back issues and is being treated by MD for this-therefore has lifting restrictions Caregiver Availability: 24/7 Family Dynamics: Close knit small family unit-son lives next door and three siblings are supportive and visit her often.  Pt has friends who check in on her also.  She feels fortunate to have the family she does.  Social History Preferred language: English Religion: Baptist Cultural Background: No issues Education: [employer] Read: Yes Write: Yes Employment Status: Employed Name of Employer: [employer] Return to Work Plans: Wants to return to work really likes her job. Legal Hisotry/Current Legal Issues: No issues Guardian/Conservator: None-according to MD pt is capable of making her own decisions while here.   Abuse/Neglect Physical Abuse: Denies Verbal Abuse: Denies Sexual Abuse: Denies Exploitation of patient/patient's resources: Denies Self-Neglect: Denies  Emotional Status Pt's affect, behavior adn adjustment status: Pt is motivated to get stronger, she really wants her swallow to improve and be on regular food again and liquids.  She is open regarding her concerns and her abilities.  She has  always been independent and able to be self sufficient and plans to get back to this level soon. Recent Psychosocial Issues: Multiple medical issues has been back and forth to the hospital multiple times. Pyschiatric History: No history- has relied upon her faith and family to get her through.  She states; " I am one to roll with the punches and figure it out along the way."  Deferred depression screen due to pt feels she is doing ok at this time.  Will monitor and have Neuro-psych see while here, probably would benefit. Substance Abuse History: Tobacco pt plans on quitting this time.  Awar eof the resources out in the community.  Patient / Family Perceptions, Expectations & Goals Pt/Family understanding of illness & functional limitations: Pt and son are able to explain pt's conditions and the treatment plan here.  Pt talks with MD on his daily rounds and feels her concerns and needs are being addressed.  She is pleased with the progress she ahs already made and feels now on rehab she can make much more. 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, Employee, Home owner, Sibling, etc Anticipated changes in roles/activities/participation: resume Pt/family expectations/goals: Pt states: " I want to be able to take care of myself and eat regular food."  Son states: " I hope she does well but will help her too."  [employer]: Other (Comment) Premorbid Home Care/DME Agencies: Other (Comment) ([facility] in [facility]) Transportation available at discharge: Family Resource referrals recommended: Support group (specify) (CHF Support [place])  Discharge Planning Living Arrangements: Alone Support Systems: Children;Other relatives;Friends/neighbors;Church/faith community Type of Residence: Private residence Insurance Resources: Media planner (specify) Herbalist) Financial Resources: Employment Financial Screen Referred: No Living Expenses: Own Money Management: Patient Does the patient  have any problems obtaining your medications?: No Home Management: Self Patient/Family Preliminary Plans: Return home with son coming to stay with her to assist with her care.  Her sister is also willing to assist if needed.  She is very supportive also and will be involved.  Awaiting team's evaluations regarding goals and targeted discharge date. Social Work Anticipated Follow Up Needs: HH/OP;Support Group  Clinical Impression Pleasant motivated female who is glad to be on rehab and ready to work and get stronger.  She is open and honest regarding her concerns while here. Aware team conference tomorrow and will have goals and targeted discharge date.  Will work toward a safe discharge plan.  Supportive son and siblings.  Lucy Chris 03/28/2014, 11:43 AM

## 2014-03-28 NOTE — Progress Notes (Signed)
Patient information reviewed and entered into eRehab system by Zandon Talton, RN, CRRN, PPS Coordinator.  Information including medical coding and functional independence measure will be reviewed and updated through discharge.    

## 2014-03-28 NOTE — Evaluation (Signed)
Physical Therapy Assessment and Plan  Patient Details  Name: [name] MRN: [id number] Date of Birth: 1955/07/22  PT Diagnosis: Contracture of joint: L ankle, Difficulty walking, Impaired sensation, Muscle weakness and Osteoarthritis Rehab Potential: Good ELOS: 12-14 days   Today's Date: 03/28/2014 PT Individual Time: 0900-1000 PT Individual Time Calculation (min): 60 min    Problem List:  Patient Active Problem List   Diagnosis Date Noted  . Physical debility 03/27/2014  . CAD- S/P RCA PCI '00, LAD (x3) and CFX DES 03/22/14 03/26/2014  . ICM- EF 35-40% by echo 03/08/14 03/26/2014  . Acute respiratory failure 03/14/2014  . Acute systolic heart failure 65/68/1275  . NSTEMI (non-ST elevated myocardial infarction)   . Left leg pain- Left inguinal hematoma on CT 03/05/2014  . Anemia 03/05/2014  . Diabetes mellitus, controlled 03/05/2014  . Aftercare following surgery of the circulatory system, [place] 12/05/2013  . Wound drainage 11/22/2013  . Acute osteomyelitis involving lower leg 09/26/2013  . Diabetic foot infection 09/25/2013  . Fever 09/23/2013  . Sepsis 09/23/2013  . PVD- s/p RFPBPG April 2015 09/23/2013  . Varicose veins of lower extremities with other complications 17/00/1749  . Ulcer of lower limb 09/07/2013  . Venous stasis 09/07/2013  . Ulcer of left lower leg 09/29/2012  . Atherosclerosis of native arteries of the extremities with ulceration(440.23) 05/05/2012    Past Medical History:  Past Medical History  Diagnosis Date  . Diabetes mellitus   . Peripheral vascular disease   . Hyperlipidemia   . Obesity   . Hypertension   . Arthritis   . Ulcer of ankle     right  . Coronary artery disease   . COPD (chronic obstructive pulmonary disease)   . NSTEMI (non-ST elevated myocardial infarction)    Past Surgical History:  Past Surgical History  Procedure Laterality Date  . Cholecystectomy    . Femoral-tibial bypass graft Right 09/27/2013    Procedure: BYPASS  GRAFT FEMORAL-TIBIAL ARTERY-RIGHT;  Surgeon: Serafina Mitchell, MD;  Location: [facility];  Service: Vascular;  Laterality: Right;  . Abdominal aorta stent Right   . Bypass graft popliteal to tibial Right   . Coronary angioplasty with stent placement      proximcal RCA stent ~ 2001  . Amputation Right 10/14/2013    Procedure: AMPUTATION RAY;  Surgeon: Newt Minion, MD;  Location: [place];  Service: Orthopedics;  Laterality: Right;  Right 2nd Ray Amputation, [place]askin Graft Right Ankle    Assessment & Plan Clinical Impression: [name] is a 58 y.o. 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 She was admitted on 03/05/14 with hypotension, tachycardia, fever of 101.9, leucocytosis and significant Left flank swelling. She was started on IV antibiotics for sepsis and was found to have left inguinal hematoma on CT. Patient was noted to have positive troponin's due to NSTEMI and started on IV heparin with serial CBC for monitoring. Right heel culture positive for pseudomonas and BC X 2 positive for MSSA. TEE negative for thrombus or vegetations. Hospital course complicated by respiratory failure requiring intubation on 09/14 to 9/19 and reintubation 09/21 to 9/23. She underwent cardiac cath on 09/18 by Dr. Irish Lack revealing severe 3 vessel CAD with EF 10-15%. She was not felt to be a surgical candidate per Dr. Prescott Gum. CAD treated medically and acute on chronic systolic  CHF treated with diuretics with clinical improvement.  Left transudate effusion tapped for 500 cc clear fluid. Speech therapy following for treatment of dysphagia with s/s of aspiration. MBS done on 09/27 revealing severe pharyngeal dysphagia and NPO recommended due to silent aspiration. She underwent PCI of RCA and LAD on 03/22/14 by Dr. Burt Knack with recommendations for ASA and Plavix for lifelong  duration. She has completed 3 week course of antibiotics for MSSA bacteremia as well as pseudomonas positive right groin wound. Patient with improvement in hoarseness and FEES done on 10/01 revealing shallow penetration of honey with ability to cough and diet initiated and advanceto Dysphagia 3, honey liquids by tsp. Medications being adjusted due to low blood pressure She is showing improvement in mobility with improvement in activity tolerance.    Patient transferred to CIR on 03/27/2014 .   Patient currently requires max with mobility secondary to muscle weakness and muscle joint tightness, decreased cardiorespiratoy endurance and decreased standing balance, decreased postural control and decreased balance strategies.  Prior to hospitalization, patient was independent  with mobility and lived with Alone in a House home.  Home access is  Level entry.  Patient will benefit from skilled PT intervention to maximize safe functional mobility, minimize fall risk and decrease caregiver burden for planned discharge home with 24 hour supervision.  Anticipate patient will benefit from follow up [place] at discharge.  PT - End of Session Activity Tolerance: Tolerates < 10 min activity, no significant change in vital signs Endurance Deficit: Yes Endurance Deficit Description: Pt needed seated rest between each task of 48min in sitting/standing PT Assessment Rehab Potential: Good PT Patient demonstrates impairments in the following area(s): Balance;Endurance;Pain;Sensory;Skin Integrity PT Transfers Functional Problem(s): Bed Mobility;Bed to Chair;Car PT Locomotion Functional Problem(s): Ambulation;Stairs PT Plan PT Intensity: Minimum of 1-2 x/day ,45 to 90 minutes PT Frequency: 5 out of 7 days PT Duration Estimated Length of Stay: 12-14 days PT Treatment/Interventions: Ambulation/gait training;Balance/vestibular training;Community reintegration;Discharge planning;Disease management/prevention;DME/adaptive  equipment instruction;Functional mobility training;Neuromuscular re-education;Pain management;Patient/family education;Psychosocial support;Skin care/wound management;Stair training;Therapeutic Activities;Therapeutic Exercise;UE/LE Strength taining/ROM;Wheelchair propulsion/positioning PT Transfers Anticipated Outcome(s): Supervision basic transfers PT Locomotion Anticipated Outcome(s): Supervision x 150' with RW PT Recommendation Recommendations for Other Services: Neuropsych consult Follow Up Recommendations: Home health PT;24 hour supervision/assistance Patient destination: Home Equipment Recommended: Rolling walker with 5" wheels  Skilled Therapeutic Intervention PT tx initiated, pt oriented to PT POC and goals. Instructed pt in LE therex in semi-reclined, sitting, and standing including each of the following 2x10:  - ankle pumps, quad sets, glute sets, marching, LAQ - static standing x57min  - passive gentle ankle ROM and stretching.    PT Evaluation Precautions/Restrictions Precautions Precautions: Fall Precaution Comments: fell 9/28 with nursing (missed EOB when sitting from stedy) Restrictions Weight Bearing Restrictions: No General   Vital SignsTherapy Vitals Pulse Rate: 63 BP: 101/43 mmHg Patient Position (if appropriate): Sitting Oxygen Therapy SpO2: 98 % O2 Device: None (Room air) Pain Pain Assessment Pain Assessment: 0-10 Pain Score: 8  Home Living/Prior Functioning Home Living Available Help at Discharge: Family;Available 24 hours/day Type of Home: House Home Access: Level entry Home Layout: One level Additional Comments: Son lives next door, not working and can help at [place] (son has 2 steps with 2 rails)  Lives With: Alone Prior Function Level of Independence: Independent with basic ADLs;Independent with gait;Independent with transfers  Able to Take Stairs?: Yes Driving: Yes Vocation: Full time employment Vision/Perception  Vision - History Baseline  Vision: Wears glasses only for reading  Cognition Overall Cognitive  Status: Within Functional Limits for tasks assessed Arousal/Alertness: Awake/alert Orientation Level: Oriented X4 Sensation Sensation Light Touch: Impaired Detail Light Touch Impaired Details: Impaired RLE;Impaired LLE Additional Comments: Decreased light touch toes>distal knees bil. Skin darkening, decreased perfusion, scars/wounds.    Mobility Transfers Transfers: Yes Stand Pivot Transfers: 2: Max assist Stand Pivot Transfer Details (indicate cue type and reason): Lifting and lowering assist as well as steady during turn Locomotion  Ambulation Ambulation: Yes Ambulation/Gait Assistance: 3: Mod assist Ambulation Distance (Feet): 15 Feet Assistive device: Rolling walker Ambulation/Gait Assistance Details: Mod steadying assist, decreased foor clearance and trunk ext.  Stairs / Additional Locomotion Stairs: No (Attempted, but pt unable) Architect: Yes Wheelchair Assistance: 5: Careers information officer: Both upper extremities Wheelchair Parts Management: Needs assistance Distance: 127  Trunk/Postural Assessment  Cervical Assessment Cervical Assessment: Within Functional Limits Thoracic Assessment Thoracic Assessment: Within Functional Limits Lumbar Assessment Lumbar Assessment: Within Functional Limits Postural Control Postural Control: Deficits on evaluation  Balance Balance Balance Assessed: Yes Dynamic Sitting Balance Sitting balance - Comments: able to sit without UE support Static Standing Balance Static Standing - Balance Support: Bilateral upper extremity supported;During functional activity Static Standing - Level of Assistance: 4: Min assist Static Standing - Comment/# of Minutes: 4mn with RW Dynamic Standing Balance Dynamic Standing - Balance Support: Bilateral upper extremity supported;During functional activity Dynamic Standing - Level of Assistance:  3: Mod assist Dynamic Standing - Balance Activities: Lateral lean/weight shifting;Forward lean/weight shifting;Reaching for objects;Reaching across midline Dynamic Standing - Comments: 137m Extremity Assessment  RUE Assessment RUE Assessment: Within Functional Limits LUE Assessment LUE Assessment: Within Functional Limits RLE Assessment RLE Assessment: Exceptions to [facility]LE PROM (degrees) RLE Overall PROM Comments: Kne ext limited by ~10 degrees, DF to 5deg RLE Strength RLE Overall Strength Comments: 3+/5 throughout LLE Assessment LLE Assessment: Exceptions to WFDrex[facility] Center For Digestive HealthLE PROM (degrees) Left Ankle Dorsiflexion: -5 Left Ankle Plantar Flexion: 10 LLE Strength LLE Overall Strength Comments: 3/5 throughout  FIM:  FIM - BeControl and instrumentation engineerevices: Walker;Arm rests Bed/Chair Transfer: 3: Bed > Chair or W/C: Mod A (lift or lower assist);2: Chair or W/C > Bed: Max A (lift and lower assist) FIM - Locomotion: Wheelchair Distance: 127 Locomotion: Wheelchair: 2: Travels 50 - 149 ft with supervision, cueing or coaxing FIM - Locomotion: Ambulation Locomotion: Ambulation Assistive Devices: WaAdministratormbulation/Gait Assistance: 3: Mod assist Locomotion: Ambulation: 1: Travels less than 50 ft with moderate assistance (Pt: 50 - 74%) FIM - Locomotion: Stairs Locomotion: Stairs: 0: Activity did not occur (Unsafe at this time)   Refer to Care Plan for Long Term Goals  Recommendations for other services: Neuropsych  Discharge Criteria: Patient will be discharged from PT if patient refuses treatment 3 consecutive times without medical reason, if treatment goals not met, if there is a change in medical status, if patient makes no progress towards goals or if patient is discharged from hospital.  The above assessment, treatment plan, treatment alternatives and goals were discussed and mutually agreed upon: by patient  CoKennieth RadPT, DPT   03/28/2014, 10:15 AM

## 2014-03-28 NOTE — Care Management Note (Signed)
[facility]patient [facility] Individual Statement of Services  Patient Name:  Kaitlin RainsCheryl C [name]  Date:  03/28/2014  Welcome to the [facility]patient [facility].  Our goal is to provide you with an individualized program based on your diagnosis and situation, designed to meet your specific needs.  With this comprehensive rehabilitation program, you will be expected to participate in at least 3 hours of rehabilitation therapies Monday-Friday, with modified therapy programming on the weekends.  Your rehabilitation program will include the following services:  Physical Therapy (PT), Occupational Therapy (OT), Speech Therapy (ST), 24 hour per day rehabilitation nursing, Therapeutic Recreaction (TR), Neuropsychology, Case Management (Social Worker), Rehabilitation Medicine, Nutrition Services and Pharmacy Services  Weekly team conferences will be held on Wednesday to discuss your progress.  Your Social Worker will talk with you frequently to get your input and to update you on team discussions.  Team conferences with you and your family in attendance may also be held.  Expected length of stay: 12-14 days Overall anticipated outcome: supervision with cueing-set up  Depending on your progress and recovery, your program may change. Your Social Worker will coordinate services and will keep you informed of any changes. Your Social Worker's name and contact numbers are listed  below.  The following services may also be recommended but are not provided by the Inpatient Rehabilitation Center:   Driving Evaluations  Home Health Rehabiltiation Services  Outpatient Rehabilitation Services  Vocational Rehabilitation   Arrangements will be made to provide these services after discharge if needed.  Arrangements include referral to agencies that provide these services.  Your insurance has been verified to be:  [employer] [place] [employer] Your primary doctor is:  [place]  Pertinent information will be shared with your  doctor and your insurance company.  Social Worker:  Dossie DerBecky Jeannette Maddy, SW [phone] or ([phone]) [phone]  Information discussed with and copy given to patient by: Lucy Chrisupree, Marquay Kruse G, 03/28/2014, 11:26 AM

## 2014-03-28 NOTE — Evaluation (Signed)
Speech Language Pathology Assessment and Plan  Patient Details  Name: [name] MRN: [id number] Date of Birth: 02/24/1956  SLP Diagnosis: Dysphagia  Rehab Potential: Excellent ELOS: 7-10 days     Today's Date: 03/28/2014 SLP Individual Time: 1405-1505 SLP Individual Time Calculation (min): 60 min   Problem List:  Patient Active Problem List   Diagnosis Date Noted  . Physical debility 03/27/2014  . CAD- S/P RCA PCI '00, LAD (x3) and CFX DES 03/22/14 03/26/2014  . ICM- EF 35-40% by echo 03/08/14 03/26/2014  . Acute respiratory failure 03/14/2014  . Acute systolic heart failure 03/14/2014  . NSTEMI (non-ST elevated myocardial infarction)   . Left leg pain- Left inguinal hematoma on CT 03/05/2014  . Anemia 03/05/2014  . Diabetes mellitus, controlled 03/05/2014  . Aftercare following surgery of the circulatory system, [place] 12/05/2013  . Wound drainage 11/22/2013  . Acute osteomyelitis involving lower leg 09/26/2013  . Diabetic foot infection 09/25/2013  . Fever 09/23/2013  . Sepsis 09/23/2013  . PVD- s/p RFPBPG April 2015 09/23/2013  . Varicose veins of lower extremities with other complications 09/07/2013  . Ulcer of lower limb 09/07/2013  . Venous stasis 09/07/2013  . Ulcer of left lower leg 09/29/2012  . Atherosclerosis of native arteries of the extremities with ulceration(440.23) 05/05/2012   Past Medical History:  Past Medical History  Diagnosis Date  . Diabetes mellitus   . Peripheral vascular disease   . Hyperlipidemia   . Obesity   . Hypertension   . Arthritis   . Ulcer of ankle     right  . Coronary artery disease   . COPD (chronic obstructive pulmonary disease)   . NSTEMI (non-ST elevated myocardial infarction)    Past Surgical History:  Past Surgical History  Procedure Laterality Date  . Cholecystectomy    . Femoral-tibial bypass graft Right 09/27/2013    Procedure: BYPASS GRAFT FEMORAL-TIBIAL ARTERY-RIGHT;  Surgeon: Nada Libman, MD;  [facility]  OR;  Service: Vascular;  Laterality: Right;  . Abdominal aorta stent Right   . Bypass graft popliteal to tibial Right   . Coronary angioplasty with stent placement      proximcal RCA stent ~ 2001  . Amputation Right 10/14/2013    Procedure: AMPUTATION RAY;  Surgeon: Nadara Mustard, MD;  Location: [facility] OR;  Service: Orthopedics;  Laterality: Right;  Right 2nd Ray Amputation, Theraskin Graft Right Ankle    Assessment / Plan / Recommendation Clinical Impression   [name] is a 58 y.o. 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 She was admitted on 03/05/14 with hypotension, tachycardia, fever of 101.9, leucocytosis and significant Left flank swelling. She was started on IV antibiotics for sepsis and was found to have left inguinal hematoma on CT. Patient was noted to have positive troponin's due to NSTEMI. Hospital course complicated by respiratory failure requiring intubation on 09/14 to 9/19 and reintubation 09/21 to 9/23. MBS done on 09/27 revealing severe pharyngeal dysphagia and NPO recommended due to silent aspiration. Patient with improvement in hoarseness and FEES done on 10/01 revealing shallow penetration of honey with ability to cough and diet of Dys 1, honey thick liquids initiated.  Advanced to Dysphagia 3 solids at bedside 03/27/2014.  Admitted to CIR 03/27/2014.  SLP evaluation completed 03/28/2014 with the following results: Pt presents with delayed cough x1 following  trials of regular water which SLP suspects was related to what appeared to be a delayed swallow initiation.  No overt s/s of aspiration noted with trials of ice chips, purees, or solid cracker consistencies.  Pt also demonstrated timely and efficient mastication and oral manipulation of all boluses.  No oral residuals appreciated post swallow.  Recommend that pt continue on dys 3  solids with honey thick liquids.  Pt with report that she prefers to consume thickened liquids from the cup versus via teaspoons as previously recommended by SLP per FEES.  Pt demonstrated adequate control of sip sizes via cup sips, and therefore SLP recommends diet advancement to honey thick liquids via cup sips.  Pt aware of the risk of aspiration without the use of swallowing precautions (i.e. extra swallows, cough immediately following swallows, and small sip size).  Pt would benefit from skilled ST while inpatient in order to maximize functional independence and reduce burden of care upon discharge.    Skilled Therapeutic Interventions          Bedside swallowing evaluation completed with results and recommendations reviewed with patient.     SLP Assessment  Patient will need skilled Speech Lanaguage Pathology Services during CIR admission    Recommendations  Recommended Consults: FEES;MBS Diet Recommendations: Dysphagia 3 (Mechanical Soft);Honey-thick liquid Liquid Administration via: Spoon;Cup Medication Administration: Whole meds with puree Supervision: Patient able to self feed;Intermittent supervision to cue for compensatory strategies Compensations: Slow rate;Small sips/bites;Multiple dry swallows after each bite/sip;Hard cough after swallow Postural Changes and/or Swallow Maneuvers: Seated upright 90 degrees Oral Care Recommendations: Oral care BID Patient destination: Home Follow up Recommendations: Other (comment) (TBD pending progress made while inpatient) Equipment Recommended: None recommended by SLP    SLP Frequency 5 out of 7 days   SLP Treatment/Interventions Patient/family education;Dysphagia/aspiration precaution training;Cueing hierarchy    Pain Pain Assessment Pain Assessment: No/denies pain Prior Functioning Cognitive/Linguistic Baseline: Within functional limits Type of Home: House  Lives With: Alone Available Help at Discharge: Family;Available 24  hours/day Vocation: Full time employment  Short Term Goals: Week 1: SLP Short Term Goal 1 (Week 1): Pt will consume presentations of her currently prescribed diet with no overt s/s of aspiration and supervision cues for use of swallowing precautions.  SLP Short Term Goal 2 (Week 1): Pt will return demonstration of pharyngeal strengthening exercises with supervision  SLP Short Term Goal 3 (Week 1): Pt will tolerate trials of advanced consistencies with no overt s/s of aspiration  SLP Short Term Goal 4 (Week 1): Pt will participate in a repeat objective swallowing study to determine readiness for diet advancement.    See FIM for current functional status Refer to Care Plan for Long Term Goals  Recommendations for other services: None  Discharge Criteria: Patient will be discharged from SLP if patient refuses treatment 3 consecutive times without medical reason, if treatment goals not met, if there is a change in medical status, if patient makes no progress towards goals or if patient is discharged from hospital.  The above assessment, treatment plan, treatment alternatives and goals were discussed and mutually agreed upon: by patient  Windell Moulding, M.A. CCC-SLP  Danna Casella, Selinda Orion 03/28/2014, 8:29 PM

## 2014-03-28 NOTE — Progress Notes (Signed)
INITIAL NUTRITION ASSESSMENT  Pt meets criteria for SEVERE MALNUTRITION in the context of chronic illness as evidenced by 21% weight loss x 4 months and intake of </= 75% of her estimated needs.  DOCUMENTATION CODES Per approved criteria  -Severe malnutrition in the context of chronic illness   INTERVENTION: 1 scoop beneprotein TID.  Ensure Pudding po BID, each supplement provides 170 kcal and 4 grams of protein   NUTRITION DIAGNOSIS: Increased nutrient needs related to wound as evidenced by estimated needs.   Goal: Pt to meet >/= 90% of their estimated nutrition needs   Monitor:  PO intake, supplement acceptance, weight trends, labs  Reason for Assessment: Pt identified as at nutrition risk on the Malnutrition Screen Tool  58 y.o. female  Admitting Dx: Deconditioning   ASSESSMENT: Pt admitted to rehab after hospitalization for sepsis and NSTEMI. Pt with hx of PVD s/p recent right lower extremity runoff.   Per pt she has lost a lot of weight in the last 6 months. She reports not being able to eat at night due to nausea for the last 6 months. Breakfast: oatmeal, Lunch: sandwich, Dinner: usually nothing. This is a change from her normal meal patterns. She has lost 21% of her body weight in 4 months. Per pt her PCP was concerned about her weight loss and she was scheduled to see GI. Per pt she has been able to tolerate all three meals. PO has been recorded as 100%.   Height: Ht Readings from Last 1 Encounters:  03/24/14 5\' 2"  (1.575 m)    Weight: Wt Readings from Last 1 Encounters:  03/28/14 164 lb (74.39 kg)    Ideal Body Weight: 50 kg   % Ideal Body Weight: 149%  Wt Readings from Last 10 Encounters:  03/28/14 164 lb (74.39 kg)  03/27/14 175 lb 14.8 oz (79.8 kg)  03/27/14 175 lb 14.8 oz (79.8 kg)  03/27/14 175 lb 14.8 oz (79.8 kg)  03/01/14 196 lb (88.905 kg)  03/01/14 196 lb (88.905 kg)  02/06/14 197 lb 4.8 oz (89.495 kg)  01/16/14 202 lb (91.627 kg)  12/05/13  208 lb 12.8 oz (94.711 kg)  11/22/13 208 lb (94.348 kg)    Usual Body Weight: 208 lb  % Usual Body Weight: 79%  BMI:  Body mass index is 29.99 kg/(m^2).  Estimated Nutritional Needs: Kcal: 1700-1900 Protein: 90-110 grams Fluid: > 1.7 L/day  Skin:  Excoriation on groin Diabetic left toe ulcer Diabetic right toe ulcer Diabetic right ankle ulcer  Diet Order: Dysphagia 3 with Honey thickened liquids Meal Completion: 100%  EDUCATION NEEDS: -No education needs identified at this time   Intake/Output Summary (Last 24 hours) at 03/28/14 1426 Last data filed at 03/28/14 1300  Gross per 24 hour  Intake    560 ml  Output      0 ml  Net    560 ml    Last BM: 10/4   Labs:   Recent Labs Lab 03/22/14 0500  03/26/14 0507 03/27/14 0500 03/28/14 0548  NA 143  < > 138 141 139  K 3.8  < > 4.0 4.0 4.4  CL 103  < > 102 105 103  CO2 29  < > 28 27 27   BUN 18  < > 9 11 13   CREATININE 0.36*  < > 0.40* 0.40* 0.49*  CALCIUM 8.5  < > 8.7 8.6 8.9  MG 2.1  --   --   --   --   PHOS 3.9  --   --   --   --  GLUCOSE 132*  < > 148* 146* 192*  < > = values in this interval not displayed.  CBG (last 3)   Recent Labs  03/27/14 2219 03/28/14 0710 03/28/14 1137  GLUCAP 242* 149* 248*    Scheduled Meds: . aspirin  81 mg Oral Daily  . atorvastatin  80 mg Oral q1800  . bisoprolol  5 mg Oral Daily  . clopidogrel  75 mg Oral Daily  . enoxaparin (LOVENOX) injection  30 mg Subcutaneous Q24H  . escitalopram  10 mg Oral Daily  . feeding supplement (ENSURE COMPLETE)  237 mL Oral BID BM  . fluconazole  100 mg Oral Daily  . furosemide  40 mg Oral Daily  . gabapentin  300 mg Oral TID  . [START ON 03/29/2014] Influenza vac split quadrivalent PF  0.5 mL Intramuscular Tomorrow-1000  . insulin aspart  0-20 Units Subcutaneous TID WC  . insulin glargine  20 Units Subcutaneous Daily  . losartan  25 mg Oral QHS  . MUSCLE RUB   Topical TID AC  . nitroGLYCERIN  0.4 mg Transdermal Q24H  .  nystatin   Topical TID  . nystatin cream   Topical BID  . predniSONE  5 mg Oral Q breakfast  . spironolactone  12.5 mg Oral Daily    Continuous Infusions:   Past Medical History  Diagnosis Date  . Diabetes mellitus   . Peripheral vascular disease   . Hyperlipidemia   . Obesity   . Hypertension   . Arthritis   . Ulcer of ankle     right  . Coronary artery disease   . COPD (chronic obstructive pulmonary disease)   . NSTEMI (non-ST elevated myocardial infarction)     Past Surgical History  Procedure Laterality Date  . Cholecystectomy    . Femoral-tibial bypass graft Right 09/27/2013    Procedure: BYPASS GRAFT FEMORAL-TIBIAL ARTERY-RIGHT;  Surgeon: Nada Libman, MD;  Location: [facility] OR;  Service: Vascular;  Laterality: Right;  . Abdominal aorta stent Right   . Bypass graft popliteal to tibial Right   . Coronary angioplasty with stent placement      proximcal RCA stent ~ 2001  . Amputation Right 10/14/2013    Procedure: AMPUTATION RAY;  Surgeon: Nadara Mustard, MD;  Location: [facility] OR;  Service: Orthopedics;  Laterality: Right;  Right 2nd Ray Amputation, [place] Graft Right Ankle    Kendell Bane RD, LDN, CNSC [phone] Pager [phone] After Hours Pager

## 2014-03-29 ENCOUNTER — Encounter (HOSPITAL_COMMUNITY): Payer: BC Managed Care – PPO | Admitting: Occupational Therapy

## 2014-03-29 ENCOUNTER — Inpatient Hospital Stay (HOSPITAL_COMMUNITY): Payer: BC Managed Care – PPO | Admitting: *Deleted

## 2014-03-29 ENCOUNTER — Inpatient Hospital Stay (HOSPITAL_COMMUNITY): Payer: BC Managed Care – PPO | Admitting: Rehabilitation

## 2014-03-29 ENCOUNTER — Inpatient Hospital Stay (HOSPITAL_COMMUNITY): Payer: BC Managed Care – PPO | Admitting: Speech Pathology

## 2014-03-29 ENCOUNTER — Inpatient Hospital Stay (HOSPITAL_COMMUNITY): Payer: BC Managed Care – PPO

## 2014-03-29 ENCOUNTER — Encounter (HOSPITAL_COMMUNITY): Payer: Self-pay | Admitting: *Deleted

## 2014-03-29 DIAGNOSIS — M25472 Effusion, left ankle: Secondary | ICD-10-CM

## 2014-03-29 DIAGNOSIS — Z9861 Coronary angioplasty status: Secondary | ICD-10-CM

## 2014-03-29 DIAGNOSIS — L538 Other specified erythematous conditions: Secondary | ICD-10-CM

## 2014-03-29 DIAGNOSIS — I739 Peripheral vascular disease, unspecified: Secondary | ICD-10-CM

## 2014-03-29 DIAGNOSIS — I2510 Atherosclerotic heart disease of native coronary artery without angina pectoris: Secondary | ICD-10-CM

## 2014-03-29 DIAGNOSIS — I255 Ischemic cardiomyopathy: Secondary | ICD-10-CM

## 2014-03-29 LAB — GLUCOSE, CAPILLARY
Glucose-Capillary: 166 mg/dL — ABNORMAL HIGH (ref 70–99)
Glucose-Capillary: 300 mg/dL — ABNORMAL HIGH (ref 70–99)
Glucose-Capillary: 251 mg/dL — ABNORMAL HIGH (ref 70–99)
Glucose-Capillary: 229 mg/dL — ABNORMAL HIGH (ref 70–99)

## 2014-03-29 MED ORDER — INSULIN ASPART 100 UNIT/ML ~~LOC~~ SOLN
0.0000 [IU] | Freq: Every day | SUBCUTANEOUS | Status: DC
  Administered 2014-03-29: 2 [IU] via SUBCUTANEOUS

## 2014-03-29 MED ORDER — INSULIN GLARGINE 100 UNIT/ML ~~LOC~~ SOLN
10.0000 [IU] | Freq: Every day | SUBCUTANEOUS | Status: AC
  Administered 2014-03-30 – 2014-03-31 (×2): 10 [IU] via SUBCUTANEOUS
  Filled 2014-03-29 (×2): qty 0.1

## 2014-03-29 MED ORDER — INSULIN ASPART 100 UNIT/ML ~~LOC~~ SOLN
0.0000 [IU] | Freq: Three times a day (TID) | SUBCUTANEOUS | Status: DC
  Administered 2014-03-30: 3 [IU] via SUBCUTANEOUS
  Administered 2014-03-30: 1 [IU] via SUBCUTANEOUS
  Administered 2014-03-31: 2 [IU] via SUBCUTANEOUS
  Administered 2014-03-31: 3 [IU] via SUBCUTANEOUS
  Administered 2014-03-31: 2 [IU] via SUBCUTANEOUS
  Administered 2014-04-01: 1 [IU] via SUBCUTANEOUS
  Administered 2014-04-01: 3 [IU] via SUBCUTANEOUS
  Administered 2014-04-02: 2 [IU] via SUBCUTANEOUS
  Administered 2014-04-02: 3 [IU] via SUBCUTANEOUS
  Administered 2014-04-02: 2 [IU] via SUBCUTANEOUS
  Administered 2014-04-03: 1 [IU] via SUBCUTANEOUS
  Administered 2014-04-03 – 2014-04-04 (×2): 2 [IU] via SUBCUTANEOUS
  Administered 2014-04-04 – 2014-04-07 (×3): 1 [IU] via SUBCUTANEOUS
  Administered 2014-04-07: 2 [IU] via SUBCUTANEOUS
  Administered 2014-04-08: 1 [IU] via SUBCUTANEOUS
  Administered 2014-04-08: 2 [IU] via SUBCUTANEOUS
  Administered 2014-04-09: 1 [IU] via SUBCUTANEOUS
  Administered 2014-04-09: 2 [IU] via SUBCUTANEOUS
  Administered 2014-04-10: 4 [IU] via SUBCUTANEOUS
  Administered 2014-04-11: 1 [IU] via SUBCUTANEOUS

## 2014-03-29 MED ORDER — METFORMIN HCL 500 MG PO TABS
1000.0000 mg | ORAL_TABLET | Freq: Two times a day (BID) | ORAL | Status: DC
  Administered 2014-03-29 – 2014-04-11 (×23): 1000 mg via ORAL
  Filled 2014-03-29 (×29): qty 2

## 2014-03-29 MED ORDER — METFORMIN HCL 500 MG PO TABS: 1000.0000 mg | ORAL_TABLET | Freq: Two times a day (BID) | ORAL | Status: DC

## 2014-03-29 MED ORDER — INSULIN ASPART 100 UNIT/ML ~~LOC~~ SOLN
4.0000 [IU] | Freq: Three times a day (TID) | SUBCUTANEOUS | Status: DC
  Administered 2014-03-29 – 2014-04-11 (×29): 4 [IU] via SUBCUTANEOUS

## 2014-03-29 MED ORDER — DOXYCYCLINE HYCLATE 100 MG PO TABS
100.0000 mg | ORAL_TABLET | Freq: Two times a day (BID) | ORAL | Status: DC
  Administered 2014-03-29: 100 mg via ORAL
  Filled 2014-03-29 (×3): qty 1

## 2014-03-29 NOTE — Progress Notes (Signed)
Physical Therapy Session Note  Patient Details  Name: Kaitlin RainsCheryl C Kulpa MRN: [id number] Date of Birth: 28-Oct-1955  Today's Date: 03/29/2014 PT Individual Time: [id number] PT Individual Time Calculation (min): 30 min   Short Term Goals: Week 1:  PT Short Term Goal 1 (Week 1): STG=LTG due to LOS, S overall  Skilled Therapeutic Interventions/Progress Updates:   Pt received sitting in recliner in room, agreeable to therapy session.  Skilled session focused on w/c propulsion for overall strengthening and endurance, gait training and transfer to elevated mat to simulate home height.  Pt ambulated approx 10' in room to w/c and self propelled x 160' to/from therapy gym using BUEs at S level.  Min cues for steering around corners for safety.  Pt ambulated x 35' x 1 and another 25' x 1 with RW at min A.  Note most difficulty in transitioning from sit>stand with poor foot placement.  Discussed foot placement and also talked about shoes.  She states that she had an order for new shoes from [employer], however had not gotten to make the appointment due to hospitalization.  When ambulating last 25' towards mat in gym, pt became very unsafe when turning with RW and leaned against mat, rather than keeping RW with her when turning.  Max verbal cues and +2 assist for safety to get pt turned around and to mat.  Pt able to boost hips up to mat at S level.  Pt transferred back to w/c with RW at min A level.  Propelled back to room and transferred to recliner.  All needs in reach.   Therapy Documentation Precautions:  Precautions Precautions: Fall Precaution Comments: fell 9/28 with nursing (missed EOB when sitting from stedy) Restrictions Weight Bearing Restrictions: No   Vital Signs: Therapy Vitals Temp: 98.4 F (36.9 C) Temp Source: Oral Pulse Rate: 65 Resp: 18 BP: 113/49 mmHg Patient Position (if appropriate): Sitting Oxygen Therapy SpO2: 98 % O2 Device: None (Room air) Pain: Pt with pain in L foot, RN  made aware   Locomotion : Ambulation Ambulation/Gait Assistance: 4: Min assist Wheelchair Mobility Distance: 160   See FIM for current functional status  Therapy/Group: Individual Therapy  [name], Ayano Douthitt Ann 03/29/2014, 5:52 PM

## 2014-03-29 NOTE — Consult Note (Signed)
[facility]     Reason for Consult: ? cellulitis    Referring Physician: Dr. Wynn BankerKirsteins  Active Problems:   Physical debility   . aspirin  81 mg Oral Daily  . atorvastatin  80 mg Oral q1800  . bisoprolol  5 mg Oral Daily  . clopidogrel  75 mg Oral Daily  . doxycycline  100 mg Oral Q12H  . enoxaparin (LOVENOX) injection  30 mg Subcutaneous Q24H  . escitalopram  10 mg Oral Daily  . feeding supplement (ENSURE)  1 Container Oral BID BM  . fluconazole  100 mg Oral Daily  . furosemide  40 mg Oral Daily  . gabapentin  300 mg Oral TID  . Influenza vac split quadrivalent PF  0.5 mL Intramuscular Tomorrow-1000  . insulin aspart  0-20 Units Subcutaneous TID WC  . insulin aspart  0-5 Units Subcutaneous QHS  . insulin aspart  0-9 Units Subcutaneous TID WC  . insulin aspart  4 Units Subcutaneous TID WC  . [START ON 03/30/2014] insulin glargine  10 Units Subcutaneous Daily  . losartan  25 mg Oral QHS  . metFORMIN  1,000 mg Oral BID WC  . MUSCLE RUB   Topical TID AC  . nitroGLYCERIN  0.4 mg Transdermal Q24H  . nystatin   Topical TID  . nystatin cream   Topical BID  . predniSONE  5 mg Oral Q breakfast  . protein supplement  1 scoop Oral TID WC  . spironolactone  12.5 mg Oral Daily    Recommendations: I will stop doxycycline   Assessment: She has an area over her left medial ankle with a soft tissue swelling that she says is chronic and blanching erythema without warmth or tenderness. This is not consistent with infection.  There is also another area on her left great toe with some serosanguinous  Fluid, no pus expressed.  She is followed by Dr. Lajoyce Cornersuda as an outpatient.    Thank you for the consult and please call with questions  Antibiotics: doxycyline  HPI: [name] is a 58 y.o. female with recent hospitalization for MSSA bacteremia with TEE negative completed 3 weeks of IV cefazolin now found in the rehab department.  She came to rehab on 10/5 due  to deconditioning.  Her course was complicated by NSTEMI, respiratory failure requiring intubation and reintubation, 3 vessel CAD and EF 10-15% during cath found and poor surgical candidate and getting medical treatment.  She is doing well with rehab but noted to have swelling of left ankle with some erythema.  She tells me she took off some dead skin in the area.     Review of Systems: A comprehensive review of systems was negative.  Past Medical History  Diagnosis Date  . Diabetes mellitus   . Peripheral vascular disease   . Hyperlipidemia   . Obesity   . Hypertension   . Arthritis   . Ulcer of ankle     right  . Coronary artery disease   . COPD (chronic obstructive pulmonary disease)   . NSTEMI (non-ST elevated myocardial infarction)     History  Substance Use Topics  . Smoking status: Former Smoker -- 2.00 packs/day for 30 years    Types: Cigarettes    Quit date: 03/11/2014  . Smokeless tobacco: Never Used     Comment: pt states that she is not quitting smoking and refused smoking cessation materials  . Alcohol Use: No    Family History  Problem Relation Age of Onset  . Cancer Mother   . Heart disease Father   . Hyperlipidemia Father   . Hypertension Father   . Other Father     varicose veins  . Heart attack Father   . Heart disease Sister   . Hyperlipidemia Sister   . Hypertension Sister   . Other Sister     varicose veins  . Heart attack Sister   . Heart disease Brother   . Hypertension Brother   . Hyperlipidemia Brother   . Other Brother     varicose veins  . Heart attack Brother    Allergies  Allergen Reactions  . Penicillins Hives    Tolerates cefepime  . Adhesive [Tape] Rash    OBJECTIVE: Blood pressure 113/49, pulse 65, temperature 98.4 F (36.9 C), temperature source Oral, resp. rate 18, height 5\' 2"  (1.575 m), weight 164 lb (74.39 kg), SpO2 98.00%. General: awake, alert nad Skin: no rashes Lungs: CTA B Cor: RRR Abdomen: soft, nt Ext:  left great toe with a small, open area with a small amount of blood, surrounding bruising, no pus, no erythema.      Left ankle with blanching erythema around cyst area, no warmth, no tenderness.    Microbiology: Recent Results (from the past 240 hour(s))  MRSA PCR SCREENING     Status: None   Collection Time    03/22/14  4:35 PM      Result Value Ref Range Status   MRSA by PCR NEGATIVE  NEGATIVE Final   Comment:            The GeneXpert MRSA Assay (FDA     approved for NASAL specimens     only), is one component of a     comprehensive MRSA colonization     surveillance program. It is not     intended to diagnose MRSA     infection nor to guide or     monitor treatment for     MRSA infections.    Staci Righter, MD [facility] [email or web address] [phone] pager  [phone] cell 03/29/2014, 3:19 PM

## 2014-03-29 NOTE — Progress Notes (Signed)
Occupational Therapy Session Note  Patient Details  Name: [name] MRN: [id number] Date of Birth: 1956/03/07  Today's Date: 03/29/2014 OT Individual Time: 1000-1100 OT Individual Time Calculation (min): 60 min    Short Term Goals: Week 1:  OT Short Term Goal 1 (Week 1): Pt will complete a stand pivot transfer from w/c to toilet with supervision. OT Short Term Goal 2 (Week 1): Pt will be able to walk 15 feet into bathroom with RW with min A. OT Short Term Goal 3 (Week 1): Pt will be able to bathe with supervision. OT Short Term Goal 4 (Week 1): Pt will be able to don her shoes with supervision. OT Short Term Goal 5 (Week 1): Pt will be able to make her bed using RW for support with min A.  Skilled Therapeutic Interventions/Progress Updates:    1:1 self care retraining at sink level with focus on activity tolerance, sit to stand with proper hand placement, standing posture, standing endurance, standing balance without UE support for clothing management and peri care.  At beginning of session pt required mod A to perform sit to stand by end of session with increase practice and pushing up on bilateral arm rests pt able to progress to min A. Used mirror for visual feedback at sink for upright posture. Pt with difficulty in prolonged standing due to discomfort in left foot. MD came in and assessed - reporting it having a blister. Discussed functional mobility and recommendations to wear ortho boots/shoes instead of socks during standing tasks. Focused on endurance while propelling her own w/c.  Continued to work on sit to stand with proper body mechanics and hand placement and functional ambulation with RW with min A with prolonged rest breaks in between activity. Pt with left great toe oozing after 2nd ambulation- RN made aware.  Therapy Documentation Precautions:  Precautions Precautions: Fall Precaution Comments: fell 9/28 with nursing (missed EOB when sitting from  stedy) Restrictions Weight Bearing Restrictions: No Pain: 8/10 left foot - RN called to make aware. MD aware and assessed. ADL: ADL ADL Comments: Refer to FIM  See FIM for current functional status  Therapy/Group: Individual Therapy  Roney MansSmith, Armanii Pressnell [facility] 03/29/2014, 1:15 PM

## 2014-03-29 NOTE — Progress Notes (Signed)
58 y.o. 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 She was admitted on 03/05/14 with hypotension, tachycardia, fever of 101.9, leucocytosis and significant Left flank swelling. She was started on IV antibiotics for sepsis and was found to have left inguinal hematoma on CT. Patient was noted to have positive troponin's due to NSTEMI and started on IV heparin with serial CBC for monitoring. Right heel culture positive for pseudomonas and BC X 2 positive for MSSA. TEE negative for thrombus or vegetations. Hospital course complicated by respiratory failure requiring intubation on 09/14 to 9/19 and reintubation 09/21 to 9/23. She underwent cardiac cath on 09/18 by Dr. Irish Lack revealing severe 3 vessel CAD with EF 10-15%. She was not felt to be a surgical candidate per Dr. Prescott Gum. CAD treated medically and acute on chronic systolic CHF treated with diuretics with clinical improvement.   Left transudate effusion tapped for 500 cc clear fluid. Speech therapy following for treatment of dysphagia with s/s of aspiration. MBS done on 09/27 revealing severe pharyngeal dysphagia and NPO recommended due to silent aspiration. She underwent PCI of RCA and LAD on 03/22/14 by Dr. Burt Knack with recommendations for ASA and Plavix for lifelong duration. She has completed 3 week course of antibiotics for MSSA bacteremia as well as pseudomonas positive right groin wound. Patient with improvement in hoarseness and FEES done on 10/01 revealing shallow penetration of honey with ability to cough and diet initiated and advanceto Dysphagia 3, honey liquids by tsp  Subjective/Complaints: Area on my left ankle, increased swelling Review of Systems - Negative except redness Left ankle Objective: Vital Signs: Blood pressure 111/48, pulse 65, temperature 98 F (36.7 C), temperature source Oral,  resp. rate 18, height $RemoveBe'5\' 2"'klEQWApUB$  (1.575 m), weight 74.39 kg (164 lb), SpO2 96.00%. No results found. Results for orders placed during the hospital encounter of 03/27/14 (from the past 72 hour(s))  GLUCOSE, CAPILLARY     Status: Abnormal   Collection Time    03/27/14  4:20 PM      Result Value Ref Range   Glucose-Capillary 187 (*) 70 - 99 mg/dL  GLUCOSE, CAPILLARY     Status: Abnormal   Collection Time    03/27/14 10:19 PM      Result Value Ref Range   Glucose-Capillary 242 (*) 70 - 99 mg/dL  CBC WITH DIFFERENTIAL     Status: Abnormal   Collection Time    03/28/14  5:48 AM      Result Value Ref Range   WBC 4.5  4.0 - 10.5 K/uL   RBC 4.35  3.87 - 5.11 MIL/uL   Hemoglobin 10.6 (*) 12.0 - 15.0 g/dL   HCT 34.4 (*) 36.0 - 46.0 %   MCV 79.1  78.0 - 100.0 fL   MCH 24.4 (*) 26.0 - 34.0 pg   MCHC 30.8  30.0 - 36.0 g/dL   RDW 21.6 (*) 11.5 - 15.5 %   Platelets 214  150 - 400 K/uL   Neutrophils Relative % 51  43 - 77 %   Lymphocytes Relative 37  12 - 46 %   Monocytes Relative 10  3 - 12 %   Eosinophils Relative 2  0 - 5 %   Basophils Relative 0  0 - 1 %   Neutro Abs 2.2  1.7 - 7.7 K/uL   Lymphs Abs 1.7  0.7 - 4.0 K/uL   Monocytes Absolute 0.5  0.1 - 1.0 K/uL   Eosinophils Absolute 0.1  0.0 - 0.7 K/uL   Basophils Absolute 0.0  0.0 - 0.1 K/uL   RBC Morphology ELLIPTOCYTES    COMPREHENSIVE METABOLIC PANEL     Status: Abnormal   Collection Time    03/28/14  5:48 AM      Result Value Ref Range   Sodium 139  137 - 147 mEq/L   Potassium 4.4  3.7 - 5.3 mEq/L   Chloride 103  96 - 112 mEq/L   CO2 27  19 - 32 mEq/L   Glucose, Bld 192 (*) 70 - 99 mg/dL   BUN 13  6 - 23 mg/dL   Creatinine, Ser 1.24 (*) 0.50 - 1.10 mg/dL   Calcium 8.9  8.4 - 58.0 mg/dL   Total Protein 6.5  6.0 - 8.3 g/dL   Albumin 2.7 (*) 3.5 - 5.2 g/dL   AST 22  0 - 37 U/L   ALT 16  0 - 35 U/L   Alkaline Phosphatase 62  39 - 117 U/L   Total Bilirubin 0.3  0.3 - 1.2 mg/dL   GFR calc non Af Amer >90  >90 mL/min   GFR calc Af  Amer >90  >90 mL/min   Comment: (NOTE)     The eGFR has been calculated using the CKD EPI equation.     This calculation has not been validated in all clinical situations.     eGFR's persistently <90 mL/min signify possible Chronic Kidney     Disease.   Anion gap 9  5 - 15  GLUCOSE, CAPILLARY     Status: Abnormal   Collection Time    03/28/14  7:10 AM      Result Value Ref Range   Glucose-Capillary 149 (*) 70 - 99 mg/dL  GLUCOSE, CAPILLARY     Status: Abnormal   Collection Time    03/28/14 11:37 AM      Result Value Ref Range   Glucose-Capillary 248 (*) 70 - 99 mg/dL  GLUCOSE, CAPILLARY     Status: Abnormal   Collection Time    03/28/14  4:48 PM      Result Value Ref Range   Glucose-Capillary 252 (*) 70 - 99 mg/dL  GLUCOSE, CAPILLARY     Status: Abnormal   Collection Time    03/28/14  9:10 PM      Result Value Ref Range   Glucose-Capillary 240 (*) 70 - 99 mg/dL  GLUCOSE, CAPILLARY     Status: Abnormal   Collection Time    03/29/14  7:10 AM      Result Value Ref Range   Glucose-Capillary 166 (*) 70 - 99 mg/dL      Head: Normocephalic and atraumatic.  Eyes: Pupils are equal, round, and reactive to light.  Neck: Neck supple.  Dry dressing right neck--site of prior central line. Non tender Cardiovascular: Normal rate. Normal rhythm. No murmurs  Respiratory: Effort normal and breath sounds normal. No respiratory distress. She has no wheezes.  GI: Soft. Bowel sounds are normal.  Musculoskeletal: She exhibits no edema and no tenderness. BLE with evidence of muscle atrophy. 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 Externally rotated, pes planus, Right 2nd toe amp Neurological: She is alert and oriented to person, place, and time.  Strong voice without dysphonia. Follows basic commands without difficulty. UE: 4/5 proximal to distal. LE: 2+ HF, 3 KE, 4/5 ankle. Decreased PP/LT  below knees bilaterally below knees.  Skin: Dry ulcerated areas bilateral great toes. Right  lateral malleolus with stage 2 ulcer. Multiple dry scabbed areas on top of foot. Bilateral shins with chronic vascular changes. Resolving ecchymosis right wrist . Sacrum with budding yeast on coccyx as well as diffuse peeling intergluteal area. Right inguinal wound healing with 2cm area. Marland Kitchen  Psychiatric: She has a normal mood and affect. Her behavior is normal. Judgment and thought content normal.    Assessment/Plan: 1. Functional deficits secondary to deconditioning after multiple medical issues/sepsis/respiratory failure   which require 3+ hours per day of interdisciplinary therapy in a comprehensive inpatient rehab setting. Physiatrist is providing close team supervision and 24 hour management of active medical problems listed below. Physiatrist and rehab team continue to assess barriers to discharge/monitor patient progress toward functional and medical goals. Team conference today please see physician documentation under team conference tab, met with team face-to-face to discuss problems,progress, and goals. Formulized individual treatment plan based on medical history, underlying problem and comorbidities. FIM: FIM - Bathing Bathing Steps Patient Completed: Chest;Right Arm;Left Arm;Abdomen;Front perineal area;Buttocks;Right upper leg;Left upper leg Bathing: 4: Min-Patient completes 8-9 76f 10 parts or 75+ percent  FIM - Upper Body Dressing/Undressing Upper body dressing/undressing steps patient completed: Thread/unthread right sleeve of pullover shirt/dresss;Thread/unthread left sleeve of pullover shirt/dress;Put head through opening of pull over shirt/dress;Pull shirt over trunk Upper body dressing/undressing: 5: Set-up assist to: Obtain clothing/put away FIM - Lower Body Dressing/Undressing Lower body dressing/undressing steps patient completed: Thread/unthread right underwear leg;Thread/unthread left underwear leg;Pull underwear up/down;Thread/unthread right pants leg;Thread/unthread left  pants leg;Pull pants up/down Lower body dressing/undressing: 4: Min-Patient completed 75 plus % of tasks  FIM - Toileting Toileting steps completed by patient: Performs perineal hygiene Toileting Assistive Devices: Grab bar or rail for support Toileting: 0: Activity did not occur  FIM - Radio producer Devices: Elevated toilet seat;Grab bars Toilet Transfers: 4-From toilet/BSC: Min A (steadying Pt. > 75%);4-To toilet/BSC: Min A (steadying Pt. > 75%)  FIM - Control and instrumentation engineer Devices: Walker;Arm rests Bed/Chair Transfer: 3: Bed > Chair or W/C: Mod A (lift or lower assist);2: Chair or W/C > Bed: Max A (lift and lower assist)  FIM - Locomotion: Wheelchair Distance: 127 Locomotion: Wheelchair: 2: Travels 50 - 149 ft with supervision, cueing or coaxing FIM - Locomotion: Ambulation Locomotion: Ambulation Assistive Devices: Administrator Ambulation/Gait Assistance: 3: Mod assist Locomotion: Ambulation: 1: Travels less than 50 ft with moderate assistance (Pt: 50 - 74%)  Comprehension Comprehension Mode: Auditory Comprehension: 6-Follows complex conversation/direction: With extra time/assistive device  Expression Expression Mode: Verbal Expression: 6-Expresses complex ideas: With extra time/assistive device  Social Interaction Social Interaction Mode: Asleep Social Interaction: 7-Interacts appropriately with others - No medications needed.  Problem Solving Problem Solving Mode: Asleep Problem Solving: 5-Solves basic 90% of the time/requires cueing < 10% of the time  Memory Memory Mode: Asleep Memory: 6-More than reasonable amt of time  Medical Problem List and Plan:   1. Functional deficits secondary to deconditioning after multiple medical issues/sepsis/respiratory failure   2. DVT Prophylaxis/Anticoagulation: Pharmaceutical: Lovenox   3. Chronic pain/Pain Management: continue Neurontin tid. Used fentanyl 100 mg as  well as oxycodone 30 mg prn at home for chronic bilateral knee as well as neuropathic foot pain.   4. Mood: team to provide ego support. Continue Lexapro. LCSW to follow for support and evaluation.   5. Neuropsych: This patient is capable of making decisions on her own behalf.  6. Skin/Wound Care: routine pressure relief measures. Monitor left groin daily.   7. Fluids/Electrolytes/Nutrition: Monitor I/O.   8. NSTEMI s/p PCI RCA, CAD and CFX DES: Continue ASA/Plavix. On Imdur, bisoprolol and Lipitor.   9. Acute systolic heart failure: Monitor daily weights and for other signs of overload. Continue Zebeta, lasix 40 mg/day, Cozaar 25 mg/day and aldactone 12. $RemoveBefor'5mg'oqNijtGHjwCC$  /day. Monitor for tolerance of medications.   10 DM type 2 with neuropathy: Hgb A1c- 7.2. Will monitor BS with ac/hs checks. Continue lantus insulin 20 units daily. SSI for elevated BS.   11. Left ant ankle cyst start doxy, ask ID to eval given complex hx  12. ABLA due to left groin hematoma: H/H improving. Will monitor for stability. Recheck in am.   13. Dysphagia: Diet advanced to Dysphagia 3 texturesand should help po intake. Continue honey liquids and push po fluids to maintain adequate hydration.   14. Hypotension: Monitor blood pressures every 8 hours and for signs orthostatic signs.   15. Severe protein malnutrition: Albumin-2.4. Continue ensure supplements bid.   16. AECOPD: Resolved--taper off prednisone.   17. Candida sacral/perineal area: will treat with diflucan   LOS (Days) 2 A FACE TO FACE EVALUATION WAS PERFORMED  KIRSTEINS,ANDREW E 03/29/2014, 12:09 PM

## 2014-03-29 NOTE — Progress Notes (Signed)
Renal status stable and po intake is good. She continues on honey liquids (all sweetened) which are likely contributing to high BS. Will resume metformin 1000 mg bid and wean off lantus. Add 4 units meal coverage tid ac per diabetes coordinator's notes.

## 2014-03-29 NOTE — Progress Notes (Signed)
Physical Therapy Session Note  Patient Details  Name: Kaitlin RainsCheryl C Merolla MRN: [id number] Date of Birth: 09/22/1955  Today's Date: 03/29/2014 PT Individual Time: 1300-1345 PT Individual Time Calculation (min): 45 min   Short Term Goals: Week 1:  PT Short Term Goal 1 (Week 1): STG=LTG due to LOS, S overall  Skilled Therapeutic Interventions/Progress Updates:  W/c propulsion using bil UEs x 160' with supervision.  Sit> stand with mod assist to extend hips; pt grabs her back and states it feels weak. Gait with RW x 15' min assist, x 2.  neuromuscular re-education via demo, manual cues for pelvic dissociation in sitting with LOB R x 2/12 movements; standing - wt shifting L><R; wt shifting in sitting- reaching in all directions with 3# weighted bar.  W/c> recliner in room with mod assist stand pivot.  All needs left within reach.     Therapy Documentation Precautions:  Precautions Precautions: Fall Precaution Comments: fell 9/28 with nursing (missed EOB when sitting from stedy) Restrictions Weight Bearing Restrictions: No   Pain:- c/o L ankle swelling/pain     Locomotion : Ambulation Ambulation/Gait Assistance: 4: Min assist Wheelchair Mobility Distance: 160       See FIM for current functional status  Therapy/Group: Individual Therapy  Theoden Mauch 03/29/2014, 4:05 PM

## 2014-03-29 NOTE — Patient Care Conference (Signed)
Inpatient RehabilitationTeam Conference and Plan of Care Update Date: 03/29/2014   Time: 11;50 AM    Patient Name: [name]      Medical Record Number: [id number]  Date of Birth: April 26, 1956 Sex: Female         Room/Bed: 4W10C/4W10C-01 Payor Info: Payor: BLUE CROSS BLUE SHIELD / Plan: BCBS PPO OUT OF STATE / Product Type: *No Product type* /    Admitting Diagnosis: deconditioning  Admit Date/Time:  03/27/2014  3:43 PM Admission Comments: No comment available   Primary Diagnosis:  <principal problem not specified> Principal Problem: <principal problem not specified>  Patient Active Problem List   Diagnosis Date Noted  . Physical debility 03/27/2014  . CAD- S/P RCA PCI '00, LAD (x3) and CFX DES 03/22/14 03/26/2014  . ICM- EF 35-40% by echo 03/08/14 03/26/2014  . Acute respiratory failure 03/14/2014  . Acute systolic heart failure 03/14/2014  . NSTEMI (non-ST elevated myocardial infarction)   . Left leg pain- Left inguinal hematoma on CT 03/05/2014  . Anemia 03/05/2014  . Diabetes mellitus, controlled 03/05/2014  . Aftercare following surgery of the circulatory system, NEC 12/05/2013  . Wound drainage 11/22/2013  . Acute osteomyelitis involving lower leg 09/26/2013  . Diabetic foot infection 09/25/2013  . Fever 09/23/2013  . Sepsis 09/23/2013  . PVD- s/p RFPBPG April 2015 09/23/2013  . Varicose veins of lower extremities with other complications 09/07/2013  . Ulcer of lower limb 09/07/2013  . Venous stasis 09/07/2013  . Ulcer of left lower leg 09/29/2012  . Atherosclerosis of native arteries of the extremities with ulceration(440.23) 05/05/2012    Expected Discharge Date: Expected Discharge Date: 04/11/14  Team Members Present: Physician leading conference: Dr. Claudette Laws Social Worker Present: Dossie Der, LCSW Nurse Present: Carmie End, RN PT Present: Wanda Plump, PT;Blair Hobble, PT OT Present: Rosalio Loud, OT SLP Present: Jackalyn Lombard, SLP PPS Coordinator  present : Tora Duck, RN, CRRN     Current Status/Progress Goal Weekly Team Focus  Medical   endurance issues, dysphagia, Left ankle sub cut cyst  maintain med stability  upgrade swallow   Bowel/Bladder   [place] of bowel and bladder  Mod I  contiune current regieme   Swallow/Nutrition/ Hydration   dys 3 solids, honey thick liquids   mod I with least restrictive diet   repeat FEES, trials of advanced consistencies    ADL's   min for bathing, LB dressing, toilet transfers w/c to toilet with bars, min - mod standing balance  supervision bathing and shower stall transfers; mod I toilet, toilet transfer with RW to toilet, and dressing  ADL retraining, functional mobility training, general strengthening, pt education   Mobility   Mod A transfers and gait x15' with RW, attempted stairs, but unsafe.   Supervision transfers and gait x150' with RW, Min A stairs  Strengthening, activity tolerance, balance, gait with RW   Communication     [facility]        Safety/Cognition/ Behavioral Observations    no unsafe behaviors        Pain   Complains of pain in Right great toe, managed with Oxycodone 10mg  PRN  Goal less than 5  Assess for pain every shift   Skin   Eschar to both great toes  Assess skin daily  Continue current regime      *See Care Plan and progress notes for long and short-term goals.  Barriers to Discharge: multi medical    Possible Resolutions to Barriers:  cont rehab  Discharge Planning/Teaching Needs:  HOme with son assisting her, but he has back issues, so lifting restrictions      Team Discussion:  Watching blister on left ankle-starting on antibiotics.  Making good progress-endurance an issue. Repeat FEE's end of week to hopefully increase diet. Supportive family  Revisions to Treatment Plan:  None   Continued Need for Acute Rehabilitation Level of Care: The patient requires daily medical management by a physician with specialized training in physical medicine and  rehabilitation for the following conditions: Daily direction of a multidisciplinary physical rehabilitation program to ensure safe treatment while eliciting the highest outcome that is of practical value to the patient.: Yes Daily medical management of patient stability for increased activity during participation in an intensive rehabilitation regime.: Yes Daily analysis of laboratory values and/or radiology reports with any subsequent need for medication adjustment of medical intervention for : Neurological problems;Pulmonary problems  Annette Liotta, Lemar LivingsRebecca G 03/29/2014, 4:10 PM

## 2014-03-29 NOTE — Progress Notes (Signed)
Social Work Patient ID: [name], female   DOB: 04/26/1956, 57 y.o.   MRN: [id number] Met with pt and her sister to discuss team conference goals-supervision/mod/i level and discharge 10/20.  She is hopeful she will be ready and meet these goals. She is motivated and feels she has made progress already since coming here.  Sister to assist at home also.  Work toward discharge.  Hopeful she will pass the FEE's end of week. 

## 2014-03-29 NOTE — Progress Notes (Signed)
Speech Language Pathology Daily Session Note  Patient Details  Name: [name] MRN: [id number] Date of Birth: 1956/03/21  Today's Date: 03/29/2014 SLP Individual Time: [id number] SLP Individual Time Calculation (min): 45 min  Short Term Goals: Week 1: SLP Short Term Goal 1 (Week 1): Pt will consume presentations of her currently prescribed diet with no overt s/s of aspiration and supervision cues for use of swallowing precautions.  SLP Short Term Goal 2 (Week 1): Pt will return demonstration of pharyngeal strengthening exercises with supervision  SLP Short Term Goal 3 (Week 1): Pt will tolerate trials of advanced consistencies with no overt s/s of aspiration  SLP Short Term Goal 4 (Week 1): Pt will participate in a repeat objective swallowing study to determine readiness for diet advancement.    Skilled Therapeutic Interventions:  Pt was seen for skilled speech therapy targeting dysphagia goals.  Upon arrival, pt was upright in wheelchair, awake, alert and pleasantly interactive.  SLP facilitated the session with trials of regular water and ice chips following thorough oral care.  Pt demonstrated delayed cough x1 following consecutive cup sips.  No overt s/s of aspiration noted following initial instructional cues for use of swallowing precautions.  Pt returned demonstration of targeted pharyngeal strengthening exercises (Mendelsohn, supraglottic swallow, effortful swallow, and laryngeal pitch glides) with supervision instructional cues.  SLP facilitated the session with skilled dysphagia education related to thickened liquids, with pt able to return demonstration of how to thicken liquids to the appropriate consistency with min visual cues.  Continue per current plan of care.    FIM:  Comprehension Comprehension Mode: Auditory Comprehension: 6-Follows complex conversation/direction: With extra time/assistive device Expression Expression Mode: Verbal Expression: 6-Expresses complex ideas:  With extra time/assistive device Social Interaction Social Interaction: 7-Interacts appropriately with others - No medications needed. Problem Solving Problem Solving: 5-Solves basic 90% of the time/requires cueing < 10% of the time Memory Memory: 6-More than reasonable amt of time FIM - Eating Eating Activity: 5: Supervision/cues  Pain Pain Assessment Pain Assessment: No/denies pain  Therapy/Group: Individual Therapy  [name], M.A. CCC-SLP  [name], [name] 03/29/2014, 11:37 AM

## 2014-03-30 ENCOUNTER — Inpatient Hospital Stay (HOSPITAL_COMMUNITY): Payer: BC Managed Care – PPO

## 2014-03-30 ENCOUNTER — Inpatient Hospital Stay (HOSPITAL_COMMUNITY): Payer: BC Managed Care – PPO | Admitting: Occupational Therapy

## 2014-03-30 ENCOUNTER — Ambulatory Visit (HOSPITAL_COMMUNITY): Payer: BC Managed Care – PPO | Admitting: Speech Pathology

## 2014-03-30 DIAGNOSIS — E119 Type 2 diabetes mellitus without complications: Secondary | ICD-10-CM

## 2014-03-30 DIAGNOSIS — I878 Other specified disorders of veins: Secondary | ICD-10-CM

## 2014-03-30 LAB — GLUCOSE, CAPILLARY
GLUCOSE-CAPILLARY: 187 mg/dL — AB (ref 70–99)
Glucose-Capillary: 179 mg/dL — ABNORMAL HIGH (ref 70–99)
Glucose-Capillary: 208 mg/dL — ABNORMAL HIGH (ref 70–99)
Glucose-Capillary: 157 mg/dL — ABNORMAL HIGH (ref 70–99)

## 2014-03-30 MED ORDER — GLUCERNA SHAKE PO LIQD
237.0000 mL | Freq: Every day | ORAL | Status: DC
  Administered 2014-04-01 – 2014-04-09 (×7): 237 mL via ORAL

## 2014-03-30 MED ORDER — ENSURE PUDDING PO PUDG
1.0000 | Freq: Every day | ORAL | Status: DC
  Administered 2014-03-31 – 2014-04-09 (×6): 1 via ORAL

## 2014-03-30 NOTE — Plan of Care (Signed)
Problem: Food- and Nutrition-Related Knowledge Deficit (NB-1.1) Goal: Nutrition education Formal process to instruct or train a patient/client in a skill or to impart knowledge to help patients/clients voluntarily manage or modify food choices and eating behavior to maintain or improve health. Outcome: Completed/Met Date Met:  03/30/14  RD consulted for nutrition education regarding diabetes.     Lab Results  Component Value Date    HGBA1C 7.2* 03/22/2014    RD provided "Carbohydrate Counting for People with Diabetes" handout from the Academy of Nutrition and Dietetics. Pt reported she had recently been educated by the PA. Briefly reviewed and discussed different food groups and their effects on blood sugar, emphasizing carbohydrate-containing foods. Provided list of carbohydrates and recommended serving sizes of common foods.  Discussed importance of controlled and consistent carbohydrate intake throughout the day. Provided examples of ways to balance meals/snacks and encouraged intake of high-fiber, whole grain complex carbohydrates. Teach back method used.  Expect good compliance.  Body mass index is 29.79 kg/(m^2). Pt meets criteria for overweight based on current BMI.  Current diet order is dysphagia 3 with honey thickened liquids , patient is consuming approximately 100% of meals at this time. Labs and medications reviewed. Continue with current interventions.  Kallie Locks, MS, RD Provisional LDN Pager # [phone] After hours/ weekend pager # [phone]

## 2014-03-30 NOTE — Plan of Care (Signed)
Problem: RH KNOWLEDGE DEFICIT Goal: RH STG INCREASE KNOWLEDGE OF DIABETES Patient and or family able to verbalize and demonstrate via teach-back  Outcome: Progressing Provided education tonight stemming from pt's questions regarding oral meds vs insulin. Also provided dietary education as pt was brought a milkshake by family member.

## 2014-03-30 NOTE — Progress Notes (Signed)
Occupational Therapy Session Note  Patient Details  Name: Leodis RainsCheryl C Atwood MRN: [id number] Date of Birth: 1955/09/14  Today's Date: 03/30/2014 OT Individual Time: 0900-1000 OT Individual Time Calculation (min): 60 min    Short Term Goals: Week 1:  OT Short Term Goal 1 (Week 1): Pt will complete a stand pivot transfer from w/c to toilet with supervision. OT Short Term Goal 2 (Week 1): Pt will be able to walk 15 feet into bathroom with RW with min A. OT Short Term Goal 3 (Week 1): Pt will be able to bathe with supervision. OT Short Term Goal 4 (Week 1): Pt will be able to don her shoes with supervision. OT Short Term Goal 5 (Week 1): Pt will be able to make her bed using RW for support with min A.  Skilled Therapeutic Interventions/Progress Updates:    Pt seen for BADL retraining of B/D with a focus on proper body mechanics to allow her to move as efficiently as possible with sit >< stand and with standing. She needs cues to bring her feet slightly behind her and to push up from the w/c. When she does this she is able to stand without A and very little difficulty, but she has difficulty feeling her feet on the floor. Pt was able to walk several steps from bed to w/c with only steady A. Pt was able to bathe and dress herself and only needed steady A 1-2x when she began to lean back.  Spent time reviewing safety at home, diabetic care, good nutritional choices. Recommended for dietician to talk to patient for general diabetic choices.    Therapy Documentation Precautions:  Precautions Precautions: Fall Precaution Comments: fell 9/28 with nursing (missed EOB when sitting from stedy) Restrictions Weight Bearing Restrictions: No   Pain: Pain Assessment Pain Assessment: 0-10 Pain Score: 4  Pain Type: Chronic pain Pain Location: Ankle Pain Orientation: Left Pain Descriptors / Indicators: Aching Pain Onset:  With activity Pain Intervention(s): rest ADL: ADL ADL Comments: Refer to  FIM  See FIM for current functional status  Therapy/Group: Individual Therapy  SAGUIER,JULIA 03/30/2014, 11:49 AM

## 2014-03-30 NOTE — Progress Notes (Signed)
Social Work Patient ID: [name], female   DOB: 1955/12/28, 58 y.o.   MRN: [id number] Faxed pt's STD forms to [employer], completed by Pam-PA, pt to follow up with if more information needed.

## 2014-03-30 NOTE — Progress Notes (Signed)
Speech Language Pathology Daily Session Note  Patient Details  Name: [name] MRN: [id number] Date of Birth: [birth year]  Today's Date: 03/30/2014 SLP Individual Time: 0800-0900 SLP Individual Time Calculation (min): 60 min  Short Term Goals: Week 1: SLP Short Term Goal 1 (Week 1): Pt will consume presentations of her currently prescribed diet with no overt s/s of aspiration and supervision cues for use of swallowing precautions.  SLP Short Term Goal 2 (Week 1): Pt will return demonstration of pharyngeal strengthening exercises with supervision  SLP Short Term Goal 3 (Week 1): Pt will tolerate trials of advanced consistencies with no overt s/s of aspiration  SLP Short Term Goal 4 (Week 1): Pt will participate in a repeat objective swallowing study to determine readiness for diet advancement.    Skilled Therapeutic Interventions:  Pt was seen for skilled speech therapy targeting dysphagia management.  Upon arrival, pt was seated upright at edge of bed consuming breakfast.  SLP provided initial instructional cues regarding use of small sips, immediate cough following swallows, and extra swallows.  No overt s/s of aspiration noted at bedside, although pt exhibited a suspected delayed swallow initiation.  Pt benefited from overall supervision cues during meal to use swallowing precautions.   Pt returned demonstration of targeted pharyngeal strengthening exercises (Mendelsohn, supraglottic swallows, effortful swallow, laryngeal pitch glides and forceful vocal fold adduction) with supervision instructions cues x10-15 reps each.  Pt reports use of exercises between sessions.  SLP provided skilled education related to dys 3 consistencies.  Handout provided to facilitate carryover between therapy sessions.  Continue per current plan of care.   FIM:  Comprehension Comprehension Mode: Auditory Comprehension: 6-Follows complex conversation/direction: With extra time/assistive  device Expression Expression Mode: Verbal Expression: 6-Expresses complex ideas: With extra time/assistive device Social Interaction Social Interaction: 7-Interacts appropriately with others - No medications needed. Problem Solving Problem Solving: 5-Solves basic 90% of the time/requires cueing < 10% of the time Memory Memory: 6-More than reasonable amt of time FIM - Eating Eating Activity: 5: Supervision/cues  Pain Pain Assessment Pain Assessment: 0-10 Pain Score: 8  Pain Type: Chronic pain Pain Location: Ankle Pain Orientation: Left Pain Descriptors / Indicators: Aching Pain Onset:  (constant) Pain Intervention(s): RN made aware  Therapy/Group: Individual Therapy  [name], [name] 03/30/2014, 12:20 PM

## 2014-03-30 NOTE — Progress Notes (Signed)
Physical Therapy Session Note  Patient Details  Name: Kaitlin RainsCheryl C Sanluis MRN: [id number] Date of Birth: 01/23/1956  Today's Date: 03/30/2014 PT Individual Time: 1108-1210 PT Individual Time Calculation (min): 62 min   Short Term Goals: Week 1:  PT Short Term Goal 1 (Week 1): STG=LTG due to LOS, S overall  Skilled Therapeutic Interventions/Progress Updates:  W/c propulsion for activity tolerance, x 150' x 2 with distant supervision, using bil UEs.  Checked L ankle; pt reported pain kept her awake all night.  Area lateral to L medial malleolus is red, firm, very tender.  ACE applied lightly to address pain.  Sit> stand from w/c with VCs for technique, min assist.  Gait with RW x 50' including a turn to R and L, supervision. Pt stated L ankle was less painful with ACE wrap on.  Therapeutic exercise performed with LEs to increase strength and flexibility for functional mobility: self stretching bil hamstrings and heel cords in sitting 3 x 30 seconds each; 1 x 10 each: long arc quad with 2# L and R ankles, bil hip add/abd in sitting; 2 x 10 trunk ext with shoulder elevation during catch/toss ball in sitting, 1 x 10 bil shoulder adduction with trunk extension using light green Theraband .  Replaced w/c with 18 x 16 to better fit pt's short LEs; changed to more supportive cushion to promote upright trunk when sitting. Pt much more comfortable.   Gait x 40' with 1 standing rest break, min guard assist.  Returned to room and all needs placed within reach.    Therapy Documentation Precautions:  Precautions Precautions: Fall Precaution Comments: fell 9/28 with nursing (missed EOB when sitting from stedy) Restrictions Weight Bearing Restrictions: No   Pain: Pain Assessment Pain Assessment: 0-10 Pain Score: 8  Pain Type: Chronic pain Pain Location: Ankle Pain Orientation: Left Pain Descriptors / Indicators: Aching Pain Onset:  (constant) Pain Intervention(s): Medication (See eMAR)       See FIM for current functional status  Therapy/Group: Individual Therapy   Hallis Meditz 03/30/2014, 12:23 PM

## 2014-03-30 NOTE — Progress Notes (Signed)
NUTRITION FOLLOW-UP  Pt meets criteria for SEVERE MALNUTRITION in the context of chronic illness as evidenced by 21% weight loss x 4 months and intake of </= 75% of her estimated needs.  DOCUMENTATION CODES Per approved criteria  -Severe malnutrition in the context of chronic illness   INTERVENTION: 1 scoop beneprotein TID.  Ensure Pudding po once daily, each supplement provides 170 kcal and 4 grams of protein  Glucerna Shake po once daily thickened to honey thick consistency, each supplement provides 220 kcal and 10 grams of protein  NUTRITION DIAGNOSIS: Increased nutrient needs related to wound as evidenced by estimated needs; ongoing  Goal: Pt to meet >/= 90% of their estimated nutrition needs; met  Monitor:  PO intake, weight trends, labs  58 y.o. female  Admitting Dx: Deconditioning   ASSESSMENT: Pt admitted to rehab after hospitalization for sepsis and NSTEMI. Pt with hx of PVD s/p recent right lower extremity runoff.   10/6- Per pt she has lost a lot of weight in the last 6 months. She reports not being able to eat at night due to nausea for the last 6 months. Breakfast: oatmeal, Lunch: sandwich, Dinner: usually nothing. This is a change from her normal meal patterns. She has lost 21% of her body weight in 4 months. Per pt her PCP was concerned about her weight loss and she was scheduled to see GI. Per pt she has been able to tolerate all three meals. PO has been recorded as 100%.   10/8-Pt is now on a dysphagia 3 diet with honey thick liquids. Meal completion is 100%. Pt reports she is taking the beneprotein as well as the Ensure pudding, however reports she does not favor it much. She reports she would like to keep the order as she likes to take them with her medications. Pt reports she would like to try Glucerna. Will order.   Height: Ht Readings from Last 1 Encounters:  03/29/14 $RemoveB'5\' 2"'hOOCnnXT$  (1.575 m)    Weight: Wt Readings from Last 1 Encounters:  03/30/14 162 lb 14.7  oz (73.9 kg)    BMI:  Body mass index is 29.79 kg/(m^2).  Re-Estimated Nutritional Needs: Kcal: 1800-2000 Protein: 90-110 grams Fluid: 1.8 - 2 L/day  Skin:  Excoriation on groin Diabetic left toe ulcer Diabetic right toe ulcer Diabetic right ankle ulcer  Diet Order: Dysphagia 3 with Honey thickened liquids   Intake/Output Summary (Last 24 hours) at 03/30/14 1001 Last data filed at 03/30/14 0900  Gross per 24 hour  Intake    480 ml  Output      0 ml  Net    480 ml    Last BM: 10/6  Labs:   Recent Labs Lab 03/26/14 0507 03/27/14 0500 03/28/14 0548  NA 138 141 139  K 4.0 4.0 4.4  CL 102 105 103  CO2 $Re'28 27 27  'yoV$ BUN $R'9 11 13  'Ni$ CREATININE 0.40* 0.40* 0.49*  CALCIUM 8.7 8.6 8.9  GLUCOSE 148* 146* 192*    CBG (last 3)   Recent Labs  03/29/14 1603 03/29/14 2108 03/30/14 0636  GLUCAP 251* 229* 179*    Scheduled Meds: . aspirin  81 mg Oral Daily  . atorvastatin  80 mg Oral q1800  . bisoprolol  5 mg Oral Daily  . clopidogrel  75 mg Oral Daily  . enoxaparin (LOVENOX) injection  30 mg Subcutaneous Q24H  . escitalopram  10 mg Oral Daily  . feeding supplement (ENSURE)  1 Container Oral BID BM  .  fluconazole  100 mg Oral Daily  . furosemide  40 mg Oral Daily  . gabapentin  300 mg Oral TID  . insulin aspart  0-5 Units Subcutaneous QHS  . insulin aspart  0-9 Units Subcutaneous TID WC  . insulin aspart  4 Units Subcutaneous TID WC  . insulin glargine  10 Units Subcutaneous Daily  . losartan  25 mg Oral QHS  . metFORMIN  1,000 mg Oral BID WC  . MUSCLE RUB   Topical TID AC  . nitroGLYCERIN  0.4 mg Transdermal Q24H  . nystatin   Topical TID  . nystatin cream   Topical BID  . protein supplement  1 scoop Oral TID WC  . spironolactone  12.5 mg Oral Daily    Continuous Infusions:   Past Medical History  Diagnosis Date  . Diabetes mellitus   . Peripheral vascular disease   . Hyperlipidemia   . Obesity   . Hypertension   . Arthritis   . Ulcer of ankle      right  . Coronary artery disease   . COPD (chronic obstructive pulmonary disease)   . NSTEMI (non-ST elevated myocardial infarction)     Past Surgical History  Procedure Laterality Date  . Cholecystectomy    . Femoral-tibial bypass graft Right 09/27/2013    Procedure: BYPASS GRAFT FEMORAL-TIBIAL ARTERY-RIGHT;  Surgeon: Serafina Mitchell, MD;  Location: [facility] OR;  Service: Vascular;  Laterality: Right;  . Abdominal aorta stent Right   . Bypass graft popliteal to tibial Right   . Coronary angioplasty with stent placement      proximcal RCA stent ~ 2001  . Amputation Right 10/14/2013    Procedure: AMPUTATION RAY;  Surgeon: Newt Minion, MD;  Location: [facility];  Service: Orthopedics;  Laterality: Right;  Right 2nd Ray Amputation, [place] Graft Right Ankle  . Ankle surgery Left     pinnining for fracture    Kallie Locks, MS, RD, Provisional LDN Pager # [phone] After hours/ weekend pager # [phone]

## 2014-03-30 NOTE — IPOC Note (Signed)
Overall Plan of Care [facility]) Patient Details Name: [name] MRN: [id number] DOB: 1956-02-08  Admitting Diagnosis: deconditioning  Hospital Problems: Active Problems:   Physical debility     Functional Problem List: Nursing Bladder  PT Balance;Endurance;Pain;Sensory;Skin Integrity  OT Balance;Endurance;Motor;Sensory;Pain  SLP Nutrition  TR         Basic ADL's: OT Grooming;Bathing;Toileting;Dressing     Advanced  ADL's: OT Light Housekeeping     Transfers: PT Bed Mobility;Bed to Chair;Car  OT Toilet;Tub/Shower     Locomotion: PT Ambulation;Stairs     Additional Impairments: OT None  SLP Swallowing      TR      Anticipated Outcomes Item Anticipated Outcome  Self Feeding I  Swallowing  Mod I with least restrictive diet   Basic self-care  mod I with dressing, supervision with bathing  Toileting  mod I   Bathroom Transfers mod I to access toilet with RW, supervision to shower with RW  Bowel/Bladder   (Patient will maintain conient bladder)  Transfers  Supervision basic transfers  Locomotion  Supervision x 150' with RW  Communication     Cognition     Pain  Pain will be less than 5  Safety/Judgment  Safety judgement   Therapy Plan: PT Intensity: Minimum of 1-2 x/day ,45 to 90 minutes PT Frequency: 5 out of 7 days PT Duration Estimated Length of Stay: 12-14 days OT Intensity: Minimum of 1-2 x/day, 45 to 90 minutes OT Frequency: 5 out of 7 days OT Duration/Estimated Length of Stay: 12-14 days SLP Intensity: Minumum of 1-2 x/day, 30 to 90 minutes SLP Frequency: 5 out of 7 days SLP Duration/Estimated Length of Stay: 7-10 days        Team Interventions: Nursing Interventions Bladder Management  PT interventions Ambulation/gait training;Balance/vestibular training;Community reintegration;Discharge planning;Disease management/prevention;DME/adaptive equipment instruction;Functional mobility training;Neuromuscular re-education;Pain  management;Patient/family education;Psychosocial support;Skin care/wound management;Stair training;Therapeutic Activities;Therapeutic Exercise;UE/LE Strength taining/ROM;Wheelchair propulsion/positioning  OT Interventions Financial controller;Functional mobility training;Pain management;Patient/family education;Self Care/advanced ADL retraining;Therapeutic Activities;Therapeutic Exercise;UE/LE Strength taining/ROM  SLP Interventions Patient/family education;Dysphagia/aspiration precaution training;Cueing hierarchy  TR Interventions    SW/CM Interventions Discharge Planning;Psychosocial Support;Patient/Family Education    Team Discharge Planning: Destination: PT-Home ,OT- Home , SLP-Home Projected Follow-up: PT-Home health PT;24 hour supervision/assistance, OT-  Home health OT, SLP-Other (comment) (TBD pending progress made while inpatient) Projected Equipment Needs: PT-Rolling walker with 5" wheels, OT- None recommended by OT, SLP-None recommended by SLP Equipment Details: PT- , OT-  Patient/family involved in discharge planning: PT- Patient,  OT-Patient, SLP-Patient  MD ELOS: 7-10d Medical Rehab Prognosis:  Good Assessment: 58 y.o. 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 She was admitted on 03/05/14 with hypotension, tachycardia, fever of 101.9, leucocytosis and significant Left flank swelling. She was started on IV antibiotics for sepsis and was found to have left inguinal hematoma on CT. Patient was noted to have positive troponin's due to NSTEMI and started on IV heparin with serial CBC for monitoring. Right heel culture positive for pseudomonas and BC X 2 positive for MSSA. TEE negative for thrombus or vegetations. Hospital course complicated by respiratory failure requiring intubation  on 09/14 to 9/19 and reintubation 09/21 to 9/23. She underwent cardiac cath on 09/18 by Dr. Eldridge Dace revealing severe 3 vessel CAD with EF 10-15%. She was not felt to be a surgical candidate per Dr. Donata Clay. CAD  treated medically and acute on chronic systolic CHF treated with diuretics with clinical improvement.   Left transudate effusion tapped for 500 cc clear fluid  Now requiring 24/7 Rehab RN,MD, as well as CIR level PT, OT and SLP.  Treatment team will focus on ADLs and mobility with goals set at Mod I   See Team Conference Notes for weekly updates to the plan of care

## 2014-03-30 NOTE — Progress Notes (Signed)
58 y.o. 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 She was admitted on 03/05/14 with hypotension, tachycardia, fever of 101.9, leucocytosis and significant Left flank swelling. She was started on IV antibiotics for sepsis and was found to have left inguinal hematoma on CT. Patient was noted to have positive troponin's due to NSTEMI and started on IV heparin with serial CBC for monitoring. Right heel culture positive for pseudomonas and BC X 2 positive for MSSA. TEE negative for thrombus or vegetations. Hospital course complicated by respiratory failure requiring intubation on 09/14 to 9/19 and reintubation 09/21 to 9/23. She underwent cardiac cath on 09/18 by Dr. Irish Lack revealing severe 3 vessel CAD with EF 10-15%. She was not felt to be a surgical candidate per Dr. Prescott Gum. CAD treated medically and acute on chronic systolic CHF treated with diuretics with clinical improvement.   Left transudate effusion tapped for 500 cc clear fluid. Speech therapy following for treatment of dysphagia with s/s of aspiration. MBS done on 09/27 revealing severe pharyngeal dysphagia and NPO recommended due to silent aspiration. She underwent PCI of RCA and LAD on 03/22/14 by Dr. Burt Knack with recommendations for ASA and Plavix for lifelong duration. She has completed 3 week course of antibiotics for MSSA bacteremia as well as pseudomonas positive right groin wound. Patient with improvement in hoarseness and FEES done on 10/01 revealing shallow penetration of honey with ability to cough and diet initiated and advanceto Dysphagia 3, honey liquids by tsp  Subjective/Complaints: Area on my left ankle feels better some pain during amb Review of Systems - Negative except redness Left ankle Objective: Vital Signs: Blood pressure 103/53, pulse 61, temperature 97.8 F (36.6 C),  temperature source Oral, resp. rate 18, height _0  (1.575 m), weight 73.9 kg (162 lb 14.7 oz), SpO2 100.00%. No results found. Results for orders placed during the hospital encounter of 03/27/14 (from the past 72 hour(s))  GLUCOSE, CAPILLARY     Status: Abnormal   Collection Time    03/27/14  4:20 PM      Result Value Ref Range   Glucose-Capillary 187 (*) 70 - 99 mg/dL  GLUCOSE, CAPILLARY     Status: Abnormal   Collection Time    03/27/14 10:19 PM      Result Value Ref Range   Glucose-Capillary 242 (*) 70 - 99 mg/dL  CBC WITH DIFFERENTIAL     Status: Abnormal   Collection Time    03/28/14  5:48 AM      Result Value Ref Range   WBC 4.5  4.0 - 10.5 K/uL   RBC 4.35  3.87 - 5.11 MIL/uL   Hemoglobin 10.6 (*) 12.0 - 15.0 g/dL   HCT 34.4 (*) 36.0 - 46.0 %   MCV 79.1  78.0 - 100.0 fL   MCH 24.4 (*) 26.0 - 34.0 pg   MCHC 30.8  30.0 - 36.0 g/dL   RDW 21.6 (*) 11.5 - 15.5 %   Platelets 214  150 - 400 K/uL   Neutrophils Relative % 51  43 - 77 %   Lymphocytes Relative 37  12 - 46 %   Monocytes Relative 10  3 - 12 %   Eosinophils Relative 2  0 - 5 %   Basophils Relative 0  0 - 1 %   Neutro Abs 2.2  1.7 - 7.7 K/uL  Lymphs Abs 1.7  0.7 - 4.0 K/uL   Monocytes Absolute 0.5  0.1 - 1.0 K/uL   Eosinophils Absolute 0.1  0.0 - 0.7 K/uL   Basophils Absolute 0.0  0.0 - 0.1 K/uL   RBC Morphology ELLIPTOCYTES    COMPREHENSIVE METABOLIC PANEL     Status: Abnormal   Collection Time    03/28/14  5:48 AM      Result Value Ref Range   Sodium 139  137 - 147 mEq/L   Potassium 4.4  3.7 - 5.3 mEq/L   Chloride 103  96 - 112 mEq/L   CO2 27  19 - 32 mEq/L   Glucose, Bld 192 (*) 70 - 99 mg/dL   BUN 13  6 - 23 mg/dL   Creatinine, Ser 0.49 (*) 0.50 - 1.10 mg/dL   Calcium 8.9  8.4 - 10.5 mg/dL   Total Protein 6.5  6.0 - 8.3 g/dL   Albumin 2.7 (*) 3.5 - 5.2 g/dL   AST 22  0 - 37 U/L   ALT 16  0 - 35 U/L   Alkaline Phosphatase 62  39 - 117 U/L   Total Bilirubin 0.3  0.3 - 1.2 mg/dL   GFR calc non Af  Amer >90  >90 mL/min   GFR calc Af Amer >90  >90 mL/min   Comment: (NOTE)     The eGFR has been calculated using the CKD EPI equation.     This calculation has not been validated in all clinical situations.     eGFR's persistently <90 mL/min signify possible Chronic Kidney     Disease.   Anion gap 9  5 - 15  GLUCOSE, CAPILLARY     Status: Abnormal   Collection Time    03/28/14  7:10 AM      Result Value Ref Range   Glucose-Capillary 149 (*) 70 - 99 mg/dL  GLUCOSE, CAPILLARY     Status: Abnormal   Collection Time    03/28/14 11:37 AM      Result Value Ref Range   Glucose-Capillary 248 (*) 70 - 99 mg/dL  GLUCOSE, CAPILLARY     Status: Abnormal   Collection Time    03/28/14  4:48 PM      Result Value Ref Range   Glucose-Capillary 252 (*) 70 - 99 mg/dL  GLUCOSE, CAPILLARY     Status: Abnormal   Collection Time    03/28/14  9:10 PM      Result Value Ref Range   Glucose-Capillary 240 (*) 70 - 99 mg/dL  GLUCOSE, CAPILLARY     Status: Abnormal   Collection Time    03/29/14  7:10 AM      Result Value Ref Range   Glucose-Capillary 166 (*) 70 - 99 mg/dL  GLUCOSE, CAPILLARY     Status: Abnormal   Collection Time    03/29/14 11:53 AM      Result Value Ref Range   Glucose-Capillary 300 (*) 70 - 99 mg/dL  GLUCOSE, CAPILLARY     Status: Abnormal   Collection Time    03/29/14  4:03 PM      Result Value Ref Range   Glucose-Capillary 251 (*) 70 - 99 mg/dL  GLUCOSE, CAPILLARY     Status: Abnormal   Collection Time    03/29/14  9:08 PM      Result Value Ref Range   Glucose-Capillary 229 (*) 70 - 99 mg/dL  GLUCOSE, CAPILLARY     Status: Abnormal   Collection  Time    03/30/14  6:36 AM      Result Value Ref Range   Glucose-Capillary 179 (*) 70 - 99 mg/dL      Head: Normocephalic and atraumatic.  Eyes: Pupils are equal, round, and reactive to light.  Neck: Neck supple.  Dry dressing right neck--site of prior central line. Non tender Cardiovascular: Normal rate. Normal rhythm. No  murmurs  Respiratory: Effort normal and breath sounds normal. No respiratory distress. She has no wheezes.  GI: Soft. Bowel sounds are normal.  Musculoskeletal: She exhibits no edema and no tenderness. BLE with evidence of muscle atrophy. 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 Externally rotated, pes planus, Right 2nd toe amp L ant ankle medial joint line erythema, raised area mild tenderness Neurological: She is alert and oriented to person, place, and time.  Strong voice without dysphonia. Follows basic commands without difficulty. UE: 4/5 proximal to distal. LE: 2+ HF, 3 KE, 4/5 ankle. Decreased PP/LT below knees bilaterally below knees.  Skin: Dry ulcerated areas bilateral great toes. Right lateral malleolus with stage 2 ulcer. Multiple dry scabbed areas on top of foot. Bilateral shins with chronic vascular changes. Marland Kitchen  Psychiatric: She has a normal mood and affect. Her behavior is normal. Judgment and thought content normal.    Assessment/Plan: 1. Functional deficits secondary to deconditioning after multiple medical issues/sepsis/respiratory failure   which require 3+ hours per day of interdisciplinary therapy in a comprehensive inpatient rehab setting. Physiatrist is providing close team supervision and 24 hour management of active medical problems listed below. Physiatrist and rehab team continue to assess barriers to discharge/monitor patient progress toward functional and medical goals.  FIM: FIM - Bathing Bathing Steps Patient Completed: Chest;Right Arm;Left Arm;Abdomen;Front perineal area;Buttocks;Right upper leg;Left upper leg;Right lower leg (including foot);Left lower leg (including foot) Bathing: 4: Steadying assist  FIM - Upper Body Dressing/Undressing Upper body dressing/undressing steps patient completed: Thread/unthread right sleeve of pullover shirt/dresss;Thread/unthread left sleeve of pullover shirt/dress;Put head through opening of pull over  shirt/dress;Pull shirt over trunk Upper body dressing/undressing: 5: Set-up assist to: Obtain clothing/put away FIM - Lower Body Dressing/Undressing Lower body dressing/undressing steps patient completed: Thread/unthread right underwear leg;Thread/unthread left underwear leg;Pull underwear up/down;Thread/unthread right pants leg;Thread/unthread left pants leg;Pull pants up/down Lower body dressing/undressing: 4: Steadying Assist  FIM - Toileting Toileting steps completed by patient: Performs perineal hygiene Toileting Assistive Devices: Grab bar or rail for support Toileting: 0: Activity did not occur  FIM - Radio producer Devices: Elevated toilet seat;Grab bars Toilet Transfers: 4-From toilet/BSC: Min A (steadying Pt. > 75%);4-To toilet/BSC: Min A (steadying Pt. > 75%)  FIM - Control and instrumentation engineer Devices: Walker;Arm rests Bed/Chair Transfer: 3: Chair or W/C > Bed: Mod A (lift or lower assist);3: Bed > Chair or W/C: Mod A (lift or lower assist)  FIM - Locomotion: Wheelchair Distance: 160 Locomotion: Wheelchair: 5: Travels 150 ft or more: maneuvers on rugs and over door sills with supervision, cueing or coaxing FIM - Locomotion: Ambulation Locomotion: Ambulation Assistive Devices: Administrator Ambulation/Gait Assistance: 4: Min assist Locomotion: Ambulation: 1: Travels less than 50 ft with minimal assistance (Pt.>75%)  Comprehension Comprehension Mode: Auditory Comprehension: 6-Follows complex conversation/direction: With extra time/assistive device  Expression Expression Mode: Verbal Expression: 6-Expresses complex ideas: With extra time/assistive device  Social Interaction Social Interaction Mode: Asleep Social Interaction: 7-Interacts appropriately with others - No medications needed.  Problem Solving Problem Solving Mode: Asleep Problem Solving: 5-Solves basic 90% of the time/requires cueing <  10% of the  time  Memory Memory Mode: Asleep Memory: 6-More than reasonable amt of time  Medical Problem List and Plan:   1. Functional deficits secondary to deconditioning after multiple medical issues/sepsis/respiratory failure   2. DVT Prophylaxis/Anticoagulation: Pharmaceutical: Lovenox   3. Chronic pain/Pain Management: continue Neurontin tid. Used fentanyl 100 mg as well as oxycodone 30 mg prn at home for chronic bilateral knee as well as neuropathic foot pain.   4. Mood: team to provide ego support. Continue Lexapro. LCSW to follow for support and evaluation.   5. Neuropsych: This patient is capable of making decisions on her own behalf.   6. Skin/Wound Care: routine pressure relief measures. Monitor left groin daily.   7. Fluids/Electrolytes/Nutrition: Monitor I/O.   8. NSTEMI s/p PCI RCA, CAD and CFX DES: Continue ASA/Plavix. On Imdur, bisoprolol and Lipitor.   9. Acute systolic heart failure: Monitor daily weights and for other signs of overload. Continue Zebeta, lasix 40 mg/day, Cozaar 25 mg/day and aldactone 12. 62m /day. Monitor for tolerance of medications.   10 DM type 2 with neuropathy: Hgb A1c- 7.2. Will monitor BS with ac/hs checks. Continue lantus insulin 20 units daily. SSI for elevated BS.   11. Left ant ankle cyst start doxy, ID rec no abx will d/c  12. ABLA due to left groin hematoma: H/H improving. Will monitor for stability. Recheck in am.   13. Dysphagia: Diet advanced to Dysphagia 3 texturesand should help po intake. Continue honey liquids and push po fluids to maintain adequate hydration.   14. Hypotension: Monitor blood pressures every 8 hours and for signs orthostatic signs.   15. Severe protein malnutrition: Albumin-2.4. Continue ensure supplements bid.   16. AECOPD: Resolved--taper off prednisone.   17. Candida sacral/perineal area: will treat with diflucan   LOS (Days) 3 A FACE TO FACE EVALUATION WAS PERFORMED  Fran Mcree E 03/30/2014, 8:57 AM

## 2014-03-31 ENCOUNTER — Encounter (HOSPITAL_COMMUNITY): Payer: BC Managed Care – PPO

## 2014-03-31 ENCOUNTER — Inpatient Hospital Stay (HOSPITAL_COMMUNITY): Payer: BC Managed Care – PPO | Admitting: *Deleted

## 2014-03-31 ENCOUNTER — Inpatient Hospital Stay (HOSPITAL_COMMUNITY): Payer: BC Managed Care – PPO | Admitting: Speech Pathology

## 2014-03-31 ENCOUNTER — Encounter: Payer: Self-pay | Admitting: Surgery

## 2014-03-31 ENCOUNTER — Inpatient Hospital Stay (HOSPITAL_COMMUNITY): Payer: BC Managed Care – PPO | Admitting: Occupational Therapy

## 2014-03-31 DIAGNOSIS — I214 Non-ST elevation (NSTEMI) myocardial infarction: Secondary | ICD-10-CM

## 2014-03-31 LAB — GLUCOSE, CAPILLARY
GLUCOSE-CAPILLARY: 195 mg/dL — AB (ref 70–99)
Glucose-Capillary: 222 mg/dL — ABNORMAL HIGH (ref 70–99)
Glucose-Capillary: 189 mg/dL — ABNORMAL HIGH (ref 70–99)
Glucose-Capillary: 167 mg/dL — ABNORMAL HIGH (ref 70–99)

## 2014-03-31 NOTE — Progress Notes (Signed)
Physical Therapy Session Note  Patient Details  Name: [name] MRN: [id number] Date of Birth: 05/15/1956  Today's Date: 03/31/2014 PT Individual Time: 1102-1202 PT Individual Time Calculation (min): 60 min   Short Term Goals: Week 1:  PT Short Term Goal 1 (Week 1): STG=LTG due to LOS, S overall  Skilled Therapeutic Interventions/Progress Updates:  Tx focused on functional mobility training, therex for strengthening, gait training, and 1 stair step. Pt up in WC, L anterio-medial ankle still hurts, but less than yesterday. Rewrapped for optimal support; likely that joint compression will improve as muscular strength increases. Pt advised to observe schedule and [facility]   Therapeutic activity Pt propelled WC 2x150' with Sx1 and Mod Ix1 and good technique. Added backrest for increased trunk support.  Performed multiple stand-step transfers with min A for steadying to complete turn Pt continues to rely heavily on UEs.  Instructed pt in multiple sit<>stand transfers, attempting without UE support, but pt unable. Pt relies on pushing with backs of LEs, and pushing up with UEs.   Therapeutic exercise Instructed pt in supine and standing ex for increased strength including  - Bridging, ankle pumps, LAQ, marching, mini-squats, and hip ABD. 2x10 each. Pt needed seated rest between tasks. - Nustep x10 min with bil UE and LE, challengeing pt to increase pace to >45spm at level 4. Pt needed 1 rest break.   Gait training with RW 2x75' with RW and min-guard A only. Pt needs postural cues as well as foot clearance. Pt limited by fatigue. Performed stairs x3 with min/mod A for steadying ascending forwards/descending backwards.   [facility]       Therapy Documentation Precautions:  Precautions Precautions: Fall Precaution Comments: fell 9/28 with nursing (missed EOB when sitting from stedy) Restrictions Weight Bearing  Restrictions: No  Pain: Pain Assessment Pain Assessment: 0-10 Pain Score: 8  Pain Location: Ankle Pain Orientation: Left Pain Onset: On-going Patients Stated Pain Goal: 6 Rewrapped ACE   Locomotion : Ambulation Ambulation/Gait Assistance: 4: Min assist Wheelchair Mobility Distance: 150   See FIM for current functional status  Therapy/Group: Individual Therapy Clydene Lamingole Guilherme Schwenke, PT, DPT   03/31/2014, 1:02 PM

## 2014-03-31 NOTE — Progress Notes (Signed)
Occupational Therapy Session Note  Patient Details  Name: [name] MRN: [id number] Date of Birth: 18-Apr-1956  Today's Date: 03/31/2014 OT Individual Time: 0905-1005 OT Individual Time Calculation (min): 60 min    Short Term Goals: Week 1:  OT Short Term Goal 1 (Week 1): Pt will complete a stand pivot transfer from w/c to toilet with supervision. OT Short Term Goal 2 (Week 1): Pt will be able to walk 15 feet into bathroom with RW with min A. OT Short Term Goal 3 (Week 1): Pt will be able to bathe with supervision. OT Short Term Goal 4 (Week 1): Pt will be able to don her shoes with supervision. OT Short Term Goal 5 (Week 1): Pt will be able to make her bed using RW for support with min A.  Skilled Therapeutic Interventions/Progress Updates:    Pt seen for BADL training of toileting, shower and dressing with a focus on balance, functional mobility, and activity tolerance.Her PA stated that pt my begin to shower.  Pt received sitting in w/c. She was able to stand and ambulate with RW with steady A into bathroom to toilet and then to shower. She was able to shower with distant supervision as she used lateral leans to wash her bottom. During dressing she was having difficulty with her pants falling down when she stood up using BUE on the armrests prior to standing. She then practiced standing using only one hand on the arm rest to pushup and one hand holding the pants. She felt it was easier to push up with the L hand.  Once self care was completed, pt worked on sit >< stand exercises, standing balance, and seated BUE AROM exercises to strengthen her shoulders. Encouraged pt to work on these over the weekend. Pt resting in w/c with all needs met.  Therapy Documentation Precautions:  Precautions Precautions: Fall Precaution Comments: fell 9/28 with nursing (missed EOB when sitting from stedy) Restrictions Weight Bearing Restrictions: No      Pain: Pain Assessment Pain Assessment:  No/denies pain ADL: ADL ADL Comments: Refer to FIM    See FIM for current functional status  Therapy/Group: Individual Therapy  Clarke Peretz 03/31/2014, 10:46 AM

## 2014-03-31 NOTE — Progress Notes (Signed)
Speech Language Pathology Daily Session Note  Patient Details  Name: [name] MRN: [id number] Date of Birth: May 23, 1956  Today's Date: 03/31/2014 SLP Individual Time: 1416-1446 SLP Individual Time Calculation (min): 30 min  Short Term Goals: Week 1: SLP Short Term Goal 1 (Week 1): Pt will consume presentations of her currently prescribed diet with no overt s/s of aspiration and supervision cues for use of swallowing precautions.  SLP Short Term Goal 2 (Week 1): Pt will return demonstration of pharyngeal strengthening exercises with supervision  SLP Short Term Goal 3 (Week 1): Pt will tolerate trials of advanced consistencies with no overt s/s of aspiration  SLP Short Term Goal 4 (Week 1): Pt will participate in a repeat objective swallowing study to determine readiness for diet advancement.   SLP Short Term Goal 4 - Progress (Week 1): Met SLP Short Term Goal 5 (Week 1): Pt will consume water and ice chips while utilizing precautions from the water protocol with supervision.  Skilled Therapeutic Interventions:  Pt was seen for skilled speech therapy targeting dysphagia goals.  Upon arrival, pt was seated upright in wheelchair, awake, alert, and pleasantly interactive.  Pt was happy about liquids advancement to nectar thick liquids per FEES earlier this afternoon.  SLP provided skilled education related to nectar thick liquids and changes in swallowing precautions.  SLP also recommends initiation of the water protocol to facilitate improved swallowing function for tolerance of thin liquids.  Pt verbalized understanding of protocol rules as well as recommendation for cough immediately following sips of thin liquids.  SLP completed skilled observations with trials of regular water following thorough oral care with pt exhibiting no overt s/s of aspiration with supervision cues for use of swallowing precautions. Signs posted in pt's room regarding water protocol and RN made aware.  Continue per  current plan of care.    FIM:  Comprehension Comprehension Mode: Auditory Comprehension: 6-Follows complex conversation/direction: With extra time/assistive device Expression Expression Mode: Verbal Expression: 6-Expresses complex ideas: With extra time/assistive device Social Interaction Social Interaction: 7-Interacts appropriately with others - No medications needed. Problem Solving Problem Solving: 5-Solves basic problems: With no assist Memory Memory: 6-More than reasonable amt of time FIM - Eating Eating Activity: 5: Supervision/cues  Pain Pain Assessment Pain Assessment: No/denies pain   Therapy/Group: Individual Therapy  Jordain Radin, Selinda Orion 03/31/2014, 3:33 PM

## 2014-03-31 NOTE — Progress Notes (Signed)
58 y.o. 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 She was admitted on 03/05/14 with hypotension, tachycardia, fever of 101.9, leucocytosis and significant Left flank swelling. She was started on IV antibiotics for sepsis and was found to have left inguinal hematoma on CT. Patient was noted to have positive troponin's due to NSTEMI and started on IV heparin with serial CBC for monitoring. Right heel culture positive for pseudomonas and BC X 2 positive for MSSA. TEE negative for thrombus or vegetations. Hospital course complicated by respiratory failure requiring intubation on 09/14 to 9/19 and reintubation 09/21 to 9/23. She underwent cardiac cath on 09/18 by Dr. Eldridge DaceVaranasi revealing severe 3 vessel CAD with EF 10-15%. She was not felt to be a surgical candidate per Dr. Donata ClayVan Trigt. CAD treated medically and acute on chronic systolic CHF treated with diuretics with clinical improvement.   Left transudate effusion tapped for 500 cc clear fluid. Speech therapy following for treatment of dysphagia with s/s of aspiration. MBS done on 09/27 revealing severe pharyngeal dysphagia and NPO recommended due to silent aspiration. She underwent PCI of RCA and LAD on 03/22/14 by Dr. Excell Seltzerooper with recommendations for ASA and Plavix for lifelong duration. She has completed 3 week course of antibiotics for MSSA bacteremia as well as pseudomonas positive right groin wound. Patient with improvement in hoarseness and FEES done on 10/01 revealing shallow penetration of honey with ability to cough and diet initiated and advanceto Dysphagia 3, honey liquids by tsp  Subjective/Complaints: Area on my left ankle feels better with ACE wrap Review of Systems - Negative except redness Left ankle Objective: Vital Signs: Blood pressure 121/52, pulse 61, temperature 98 F (36.7 C), temperature  source Oral, resp. rate 17, height 5\' 2"  (1.575 m), weight 77 kg (169 lb 12.1 oz), SpO2 93.00%. No results found. Results for orders placed during the hospital encounter of 03/27/14 (from the past 72 hour(s))  GLUCOSE, CAPILLARY     Status: Abnormal   Collection Time    03/28/14 11:37 AM      Result Value Ref Range   Glucose-Capillary 248 (*) 70 - 99 mg/dL  GLUCOSE, CAPILLARY     Status: Abnormal   Collection Time    03/28/14  4:48 PM      Result Value Ref Range   Glucose-Capillary 252 (*) 70 - 99 mg/dL  GLUCOSE, CAPILLARY     Status: Abnormal   Collection Time    03/28/14  9:10 PM      Result Value Ref Range   Glucose-Capillary 240 (*) 70 - 99 mg/dL  GLUCOSE, CAPILLARY     Status: Abnormal   Collection Time    03/29/14  7:10 AM      Result Value Ref Range   Glucose-Capillary 166 (*) 70 - 99 mg/dL  GLUCOSE, CAPILLARY     Status: Abnormal   Collection Time    03/29/14 11:53 AM      Result Value Ref Range   Glucose-Capillary 300 (*) 70 - 99 mg/dL  GLUCOSE, CAPILLARY     Status: Abnormal   Collection Time    03/29/14  4:03 PM      Result Value Ref Range   Glucose-Capillary 251 (*) 70 - 99 mg/dL  GLUCOSE, CAPILLARY     Status: Abnormal   Collection Time    03/29/14  9:08 PM  Result Value Ref Range   Glucose-Capillary 229 (*) 70 - 99 mg/dL  GLUCOSE, CAPILLARY     Status: Abnormal   Collection Time    03/30/14  6:36 AM      Result Value Ref Range   Glucose-Capillary 179 (*) 70 - 99 mg/dL  GLUCOSE, CAPILLARY     Status: Abnormal   Collection Time    03/30/14 12:16 PM      Result Value Ref Range   Glucose-Capillary 208 (*) 70 - 99 mg/dL   Comment 1 Notify RN    GLUCOSE, CAPILLARY     Status: Abnormal   Collection Time    03/30/14  4:25 PM      Result Value Ref Range   Glucose-Capillary 187 (*) 70 - 99 mg/dL   Comment 1 Notify RN    GLUCOSE, CAPILLARY     Status: Abnormal   Collection Time    03/30/14  9:12 PM      Result Value Ref Range   Glucose-Capillary 157  (*) 70 - 99 mg/dL   Comment 1 Notify RN    GLUCOSE, CAPILLARY     Status: Abnormal   Collection Time    03/31/14  7:13 AM      Result Value Ref Range   Glucose-Capillary 195 (*) 70 - 99 mg/dL   Comment 1 Notify RN        Head: Normocephalic and atraumatic.  Eyes: Pupils are equal, round, and reactive to light.  Neck: Neck supple.  Dry dressing right neck--site of prior central line. Non tender Cardiovascular: Normal rate. Normal rhythm. No murmurs  Respiratory: Effort normal and breath sounds normal. No respiratory distress. She has no wheezes.  GI: Soft. Bowel sounds are normal.  Musculoskeletal: She exhibits no edema and no tenderness. BLE with evidence of muscle atrophy. 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 Externally rotated, pes planus, Right 2nd toe amp L ant ankle medial joint line erythema, raised area mild tenderness Neurological: She is alert and oriented to person, place, and time.  Strong voice without dysphonia. Follows basic commands without difficulty. UE: 4/5 proximal to distal. LE: 2+ HF, 3 KE, 4/5 ankle. Decreased PP/LT below knees bilaterally below knees.  Skin: Dry ulcerated areas bilateral great toes. Right lateral malleolus with stage 2 ulcer. Multiple dry scabbed areas on top of foot. Bilateral shins with chronic vascular changes. Marland Kitchen  Psychiatric: She has a normal mood and affect. Her behavior is normal. Judgment and thought content normal.    Assessment/Plan: 1. Functional deficits secondary to deconditioning after multiple medical issues/sepsis/respiratory failure   which require 3+ hours per day of interdisciplinary therapy in a comprehensive inpatient rehab setting. Physiatrist is providing close team supervision and 24 hour management of active medical problems listed below. Physiatrist and rehab team continue to assess barriers to discharge/monitor patient progress toward functional and medical goals.  FIM: FIM - Bathing Bathing Steps  Patient Completed: Chest;Right Arm;Left Arm;Abdomen;Front perineal area;Buttocks;Right upper leg;Left upper leg (pt chose to not wash feet) Bathing: 4: Steadying assist  FIM - Upper Body Dressing/Undressing Upper body dressing/undressing steps patient completed: Thread/unthread right sleeve of pullover shirt/dresss;Thread/unthread left sleeve of pullover shirt/dress;Put head through opening of pull over shirt/dress;Pull shirt over trunk Upper body dressing/undressing: 5: Set-up assist to: Obtain clothing/put away FIM - Lower Body Dressing/Undressing Lower body dressing/undressing steps patient completed: Thread/unthread right underwear leg;Thread/unthread left underwear leg;Pull underwear up/down;Thread/unthread right pants leg;Thread/unthread left pants leg;Pull pants up/down;Don/Doff right shoe;Don/Doff left shoe;Fasten/unfasten right shoe;Fasten/unfasten left  shoe Lower body dressing/undressing: 4: Steadying Assist  FIM - Toileting Toileting steps completed by patient: Performs perineal hygiene Toileting Assistive Devices: Grab bar or rail for support Toileting: 0: Activity did not occur  FIM - Diplomatic Services operational officerToilet Transfers Toilet Transfers Assistive Devices: Elevated toilet seat;Grab bars Toilet Transfers: 4-From toilet/BSC: Min A (steadying Pt. > 75%);4-To toilet/BSC: Min A (steadying Pt. > 75%)  FIM - Bed/Chair Transfer Bed/Chair Transfer Assistive Devices: Arm rests Bed/Chair Transfer: 4: Chair or W/C > Bed: Min A (steadying Pt. > 75%);4: Bed > Chair or W/C: Min A (steadying Pt. > 75%)  FIM - Locomotion: Wheelchair Distance: 150 Locomotion: Wheelchair: 5: Travels 150 ft or more: maneuvers on rugs and over door sills with supervision, cueing or coaxing FIM - Locomotion: Ambulation Locomotion: Ambulation Assistive Devices: Designer, industrial/productWalker - Rolling Ambulation/Gait Assistance: 4: Min assist Locomotion: Ambulation: 2: Travels 50 - 149 ft with minimal assistance (Pt.>75%)  Comprehension Comprehension  Mode: Auditory Comprehension: 6-Follows complex conversation/direction: With extra time/assistive device  Expression Expression Mode: Verbal Expression: 6-Expresses complex ideas: With extra time/assistive device  Social Interaction Social Interaction Mode: Asleep Social Interaction: 7-Interacts appropriately with others - No medications needed.  Problem Solving Problem Solving Mode: Asleep Problem Solving: 5-Solves basic 90% of the time/requires cueing < 10% of the time  Memory Memory Mode: Asleep Memory: 6-More than reasonable amt of time  Medical Problem List and Plan:   1. Functional deficits secondary to deconditioning after multiple medical issues/sepsis/respiratory failure   2. DVT Prophylaxis/Anticoagulation: Pharmaceutical: Lovenox   3. Chronic pain/Pain Management: continue Neurontin tid. Used fentanyl 100 mg as well as oxycodone 30 mg prn at home for chronic bilateral knee as well as neuropathic foot pain.   4. Mood: team to provide ego support. Continue Lexapro. LCSW to follow for support and evaluation.   5. Neuropsych: This patient is capable of making decisions on her own behalf.   6. Skin/Wound Care: routine pressure relief measures. Monitor left groin daily.   7. Fluids/Electrolytes/Nutrition: Monitor I/O.   8. NSTEMI s/p PCI RCA, CAD and CFX DES: Continue ASA/Plavix. On Imdur, bisoprolol and Lipitor.   9. Acute systolic heart failure: Monitor daily weights and for other signs of overload. Continue Zebeta, lasix 40 mg/day, Cozaar 25 mg/day and aldactone 12. 5mg  /day. Monitor for tolerance of medications.   10 DM type 2 with neuropathy: Hgb A1c- 7.2. Will monitor BS with ac/hs checks. Continue lantus insulin 20 units daily. SSI for elevated BS.   11. Left ant ankle cyst start doxy, ID rec no abx will d/c  12. ABLA due to left groin hematoma: H/H improving. Will monitor for stability. Recheck in am.   13. Dysphagia: Diet advanced to Dysphagia 3 texturesand should help  po intake. Continue honey liquids and push po fluids to maintain adequate hydration.   14. Hypotension: Monitor blood pressures every 8 hours and for signs orthostatic signs.   15. Severe protein malnutrition: Albumin-2.4. Continue ensure supplements bid.   16. AECOPD: Resolved--taper off prednisone.      LOS (Days) 4 A FACE TO FACE EVALUATION WAS PERFORMED  Anees Vanecek E 03/31/2014, 8:39 AM

## 2014-03-31 NOTE — Procedures (Signed)
Objective Swallowing Evaluation: Fiberoptic Endoscopic Evaluation of Swallowing  Patient Details  Name: [name] MRN: [id number] Date of Birth: 1956/01/29  Today's Date: 03/31/2014 Time:  -     Past Medical History:  Past Medical History  Diagnosis Date  . Diabetes mellitus   . Peripheral vascular disease   . Hyperlipidemia   . Obesity   . Hypertension   . Arthritis   . Ulcer of ankle     right  . Coronary artery disease   . COPD (chronic obstructive pulmonary disease)   . NSTEMI (non-ST elevated myocardial infarction)    Past Surgical History:  Past Surgical History  Procedure Laterality Date  . Cholecystectomy    . Femoral-tibial bypass graft Right 09/27/2013    Procedure: BYPASS GRAFT FEMORAL-TIBIAL ARTERY-RIGHT;  Surgeon: Serafina Mitchell, MD;  Location: [facility] OR;  Service: Vascular;  Laterality: Right;  . Abdominal aorta stent Right   . Bypass graft popliteal to tibial Right   . Coronary angioplasty with stent placement      proximcal RCA stent ~ 2001  . 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 Ankle surgery Left     pinnining for fracture   HPI:  58 year old female with h/o COPD, DM, peripheral vascular disease, admitted worsening left lower extremity pain and  and abdominal pain, developed acute hypoxic respiratory failure due to pulmonary edema requiring intubation 9/14-9/21, extubated and reintubated 9/21-9/23, NSTEMI, MSSA bacteremia after recent angiogram. FEES on 9/24 revealed severe acute reversible dysphagia characterized by intubation related sensory deficits, generalized weakness, and moderately severe laryngeal and pharyngeal edema resulting in a delayed swallow initiation and decreased laryngeal closure. Deep penetration and aspiration with inconsistent sensation of all consistencies.  NG  placed.  BSE reordered.       Recommendation/Prognosis  Clinical Impression:   Dysphagia Diagnosis: Mild pharyngeal phase dysphagia  Clinical impression: Pt demonstrates further gains in pharyngeal swallow with increased airway protection and decreased residuals. Pt continues to have a mild delay in swallow initiation, which occurs consistently at the level of the pyriform sinuses with all consistencies. This discoordination results in trace, silent aspiration of thin liquids which pt can clear with a cued throat clear. No penetration or aspiration occurs with nectar thick liquids or solids. Recommend to advance diet to Dys 3 textures and nectar thick liquids with introduction of the water protocol to facilitate transition to thin liquids.   Swallow Evaluation Recommendations:  Diet Recommendations: Dysphagia 3 (Mechanical Soft);Nectar-thick liquid Liquid Administration via: Cup;No straw Medication Administration: Whole meds with puree Supervision: Patient able to self feed;Intermittent supervision to cue for compensatory strategies Compensations: Slow rate;Small sips/bites Postural Changes and/or Swallow Maneuvers: Seated upright 90 degrees Oral Care Recommendations: Oral care BID Other Recommendations: Order thickener from pharmacy;Prohibited food (jello, ice cream, thin soups);Remove water pitcher    Prognosis:  Prognosis for Safe Diet Advancement: Good   Individuals Consulted: Consulted and Agree with Results and Recommendations: Patient;Other (Comment);Family member/caregiver (primary SLP) Family Member Consulted: sister      SLP Assessment/Plan  Plan:  Potential to Achieve Goals: Good   Short Term Goals: Week 1: SLP Short Term Goal 1 (Week 1): Pt will consume presentations of her currently prescribed diet with no overt s/s of aspiration and supervision cues for use of swallowing precautions.  SLP Short Term Goal  2 (Week 1): Pt will return demonstration of pharyngeal  strengthening exercises with supervision  SLP Short Term Goal 3 (Week 1): Pt will tolerate trials of advanced consistencies with no overt s/s of aspiration  SLP Short Term Goal 4 (Week 1): Pt will participate in a repeat objective swallowing study to determine readiness for diet advancement.   SLP Short Term Goal 4 - Progress (Week 1): Met SLP Short Term Goal 5 (Week 1): Pt will consume water and ice chips while utilizing precautions from the water protocol with supervision.    General: Type of Study: Fiberoptic Endoscopic Evaluation of Swallowing Reason for Referral: Objectively evaluate swallowing function Previous Swallow Assessment: FEES 03/23/2014 recommending to initiate Dys 1 and honey by tsp Diet Prior to this Study: Dysphagia 3 (soft);Honey-thick liquids (honey by cup sips) Temperature Spikes Noted: No Respiratory Status: Room air History of Recent Intubation: Yes Length of Intubations (days): 7 days Date extubated: 03/15/14 Behavior/Cognition: Alert;Cooperative;Pleasant mood Oral Cavity - Dentition: Adequate natural dentition Oral Motor / Sensory Function: Within functional limits Self-Feeding Abilities: Able to feed self Patient Positioning: Upright in chair Baseline Vocal Quality: Hoarse (subjectively improved since last FEES) Volitional Cough: Strong Volitional Swallow: Able to elicit Anatomy: Within functional limits (edema seems further improved) Pharyngeal Secretions: Normal   Reason for Referral:   Objectively evaluate swallowing function    Oral Phase: Oral Preparation/Oral Phase Oral Phase: WFL   Pharyngeal Phase:  Pharyngeal Phase Pharyngeal Phase: Impaired Pharyngeal - Honey Pharyngeal - Honey Teaspoon: Not tested Pharyngeal - Honey Cup: Not tested Pharyngeal - Nectar Pharyngeal - Nectar Teaspoon: Delayed swallow initiation Penetration/Aspiration details (nectar teaspoon): Material does not enter airway Pharyngeal - Nectar Cup: Delayed swallow  initiation Penetration/Aspiration details (nectar cup): Material does not enter airway Pharyngeal - Thin Pharyngeal - Ice Chips: Delayed swallow initiation Penetration/Aspiration details (ice chips): Material does not enter airway Pharyngeal - Thin Cup: Delayed swallow initiation;Penetration/Aspiration before swallow;Penetration/Aspiration during swallow;Trace aspiration Penetration/Aspiration details (thin cup): Material enters airway, passes BELOW cords without attempt by patient to eject out (silent aspiration) Pharyngeal - Solids Pharyngeal - Puree: Delayed swallow initiation Penetration/Aspiration details (puree): Material does not enter airway Pharyngeal - Mechanical Soft: Delayed swallow initiation   Cervical Esophageal Phase  Cervical Esophageal Phase Cervical Esophageal Phase: WFL   GN         Germain Osgood, M.A. CCC-SLP [phone]  Germain Osgood 03/31/2014, 2:11 PM

## 2014-04-01 ENCOUNTER — Inpatient Hospital Stay (HOSPITAL_COMMUNITY): Payer: BC Managed Care – PPO | Admitting: Speech Pathology

## 2014-04-01 ENCOUNTER — Inpatient Hospital Stay (HOSPITAL_COMMUNITY): Payer: BC Managed Care – PPO | Admitting: Rehabilitation

## 2014-04-01 ENCOUNTER — Inpatient Hospital Stay (HOSPITAL_COMMUNITY): Payer: BC Managed Care – PPO | Admitting: *Deleted

## 2014-04-01 LAB — GLUCOSE, CAPILLARY
GLUCOSE-CAPILLARY: 224 mg/dL — AB (ref 70–99)
GLUCOSE-CAPILLARY: 132 mg/dL — AB (ref 70–99)
Glucose-Capillary: 136 mg/dL — ABNORMAL HIGH (ref 70–99)

## 2014-04-01 NOTE — Progress Notes (Signed)
Speech Language Pathology Daily Session Note  Patient Details  Name: [name] MRN: [id number] Date of Birth: [birth year]  Today's Date: 04/01/2014 SLP Individual Time: 1130-1200 SLP Individual Time Calculation (min): 30 min  Short Term Goals: Week 1: SLP Short Term Goal 1 (Week 1): Pt will consume presentations of her currently prescribed diet with no overt s/s of aspiration and supervision cues for use of swallowing precautions.  SLP Short Term Goal 2 (Week 1): Pt will return demonstration of pharyngeal strengthening exercises with supervision  SLP Short Term Goal 3 (Week 1): Pt will tolerate trials of advanced consistencies with no overt s/s of aspiration  SLP Short Term Goal 4 (Week 1): Pt will participate in a repeat objective swallowing study to determine readiness for diet advancement.   SLP Short Term Goal 4 - Progress (Week 1): Met SLP Short Term Goal 5 (Week 1): Pt will consume water and ice chips while utilizing precautions from the water protocol with supervision.  Skilled Therapeutic Interventions: Skilled ST intervention provided with focus on dysphagia goals; Pt seen in room for ST treatment. Pt recalled precautions and verbally sequenced steps to free water protocol with 100% accuracy. Pt utilized appropriate posture with all PO intake, no overt s/s of aspiration noted. Slp reviewed phar exercises and swallowing precautions. Pt aware of current diet recommendations and restrictions. Pt performed phar exercises x5 each with min verbal/visual cuing required.    FIM:  Comprehension Comprehension Mode: Auditory Comprehension: 6-Follows complex conversation/direction: With extra time/assistive device Expression Expression Mode: Verbal Expression: 6-Expresses complex ideas: With extra time/assistive device Social Interaction Social Interaction: 7-Interacts appropriately with others - No medications needed. Problem Solving Problem Solving: 5-Solves basic problems: With no  assist Memory Memory: 6-More than reasonable amt of time FIM - Eating Eating Activity: 6: Swallowing techniques: self-managed  Pain Pain Assessment Pain Assessment: No/denies pain Pain Score: 3  Pain Type: Acute pain Pain Location: Ankle Pain Orientation: Left Pain Descriptors / Indicators: Aching;Sore Pain Frequency: Constant Pain Onset: With Activity Patients Stated Pain Goal: 2 Pain Intervention(s): Medication (See eMAR)  Therapy/Group: Individual Therapy  Porchea Charrier, Bernerd Pho 04/01/2014, 12:57 PM

## 2014-04-01 NOTE — Progress Notes (Signed)
Physical Therapy Session Note  Patient Details  Name: Kaitlin RainsCheryl C [name] MRN: [id number] Date of Birth: 12-14-1955  Today's Date: 04/01/2014 PT Individual Time: 1030-1115 PT Individual Time Calculation (min): 45 min   Short Term Goals: Week 1:  PT Short Term Goal 1 (Week 1): STG=LTG due to LOS, S overall  Skilled Therapeutic Interventions/Progress Updates:   Pt received sitting in w/c, agreeable to therapy session.  Propelled to therapy gym x 100' with BUEs to increase overall strengthening and endurance. Donned new R shoe and L ace wrap on ankle prior to gait training.  Performed 45' gait x 1 rep at min/guard level with min cues for upright posture and increased B stride length.  Remainder of session focused on supine and seated therex to work on B LE strengthening and core strengthening.  Performed supine B LE bridging x 10 reps (with post pelvic tilt prior to performing), mini crunches for upper abd x 10 reps, diagonal crunches for obliques x 10 reps, SLR BLE x 10 reps, and SL hip abd BLE x 10 reps.  Ended with sit<>stand x 10 reps for quad and glute strengthening.  Tolerated well, however relies heavily on UEs for support and to maintain balance.  Transferred back to w/c with RW at min A level and pt propelled back to room as stated above.  All  Needs in reach.   Therapy Documentation Precautions:  Precautions Precautions: Fall Precaution Comments: fell 9/28 with nursing (missed EOB when sitting from stedy) Restrictions Weight Bearing Restrictions: No   Pain: Pain Assessment Pain Assessment: 0-10 Pain Score: 3  Pain Type: Acute pain Pain Location: Ankle Pain Orientation: Left Pain Descriptors / Indicators: Aching;Sore Pain Frequency: Constant Pain Onset: With Activity Patients Stated Pain Goal: 2 Pain Intervention(s): Medication (See eMAR)  See FIM for current functional status  Therapy/Group: Individual Therapy  [name], [name] 04/01/2014, 12:29 PM

## 2014-04-01 NOTE — Progress Notes (Signed)
Occupational Therapy Session Note  Patient Details  Name: Leodis RainsCheryl C Macbride MRN: [id number] Date of Birth: 04-29-1956  Today's Date: 04/01/2014 OT Individual Time:  -   [phone]  (60 min)  1st session      Short Term Goals: Week 1:  OT Short Term Goal 1 (Week 1): Pt will complete a stand pivot transfer from w/c to toilet with supervision. OT Short Term Goal 2 (Week 1): Pt will be able to walk 15 feet into bathroom with RW with min A. OT Short Term Goal 3 (Week 1): Pt will be able to bathe with supervision. OT Short Term Goal 4 (Week 1): Pt will be able to don her shoes with supervision. OT Short Term Goal 5 (Week 1): Pt will be able to make her bed using RW for support with min A. Week 2:     Skilled Therapeutic Interventions/Progress Updates:    1st session:    Pt. Lying in bed upon OT arrival.  Easily aroused.  Addressed bed mobility, transfers, sit to stand and wc mobility..  Pt. Transferred from bed to wc.  Propelled wc to bathroom with minimal assist over door jams.  Transferred to bath tub bench.  Bathed self with lateral leans for peri area.  Dressed self with increased time at sink.  Did minimal assist with sit to stand x 3.  Pt. Completed grooming at wc level at mod I level. Left pt in wc with safety belt on and call bell,phone within reach.   Pt in room in wc with all needs in place.     Therapy Documentation Precautions:  Precautions Precautions: Fall Precaution Comments: fell 9/28 with nursing (missed EOB when sitting from stedy) Restrictions Weight Bearing Restrictions: No      Pain:  4/10 left ankle   ADL: ADL ADL Comments: Refer to FIM    Other Treatments:    2nd session:  Time:  1400-1445  (45 min) Pain: 8/10 leg pain:  RN administered meds Individual session  Engaged in functional mobility at wc and RW level to address balance in simple household duties.  Pt utilized RW to Entergy Corporationsweep kitchen floor with close Supervision and one rest break.  She has extended dust  pan at home to sweep into without bending over.  Pt cleaned counters with one rest after 3 minutes.  Performed UE AROM with 2 # weights for 3 sets.  Pt. Propelled wc back to room.  Pt. Brushed teeth and followed water protocol afterwards.  Transferred to EOB and left with all needs in place   See FIM for current functional status  Therapy/Group: Individual Therapy  Humberto Sealsdwards, Kiyona Mcnall J 04/01/2014, 9:53 AM

## 2014-04-01 NOTE — Progress Notes (Signed)
Patient ID: [name], female   DOB: 22-Aug-1955, 58 y.o.   MRN: [id number]  04/01/14.  58 y.o. 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 She was admitted on 03/05/14 with hypotension, tachycardia, fever of 101.9, leucocytosis and significant Left flank swelling.  She was started on IV antibiotics for sepsis and was found to have left inguinal hematoma on CT. Patient was noted to have positive troponin's due to NSTEMI and started on IV heparin with serial CBC for monitoring. Right heel culture positive for pseudomonas and BC X 2 positive for MSSA. TEE negative for thrombus or vegetations.  Hospital course complicated by respiratory failure requiring intubation on 09/14 to 9/19 and reintubation 09/21 to 9/23. She underwent cardiac cath on 09/18 by Dr. Eldridge DaceVaranasi revealing severe 3 vessel CAD with EF 10-15%. She was not felt to be a surgical candidate per Dr. Donata ClayVan Trigt. CAD treated medically and acute on chronic systolic CHF treated with diuretics with clinical improvement.   Left transudate effusion tapped for 500 cc clear fluid. Speech therapy following for treatment of dysphagia with s/s of aspiration. MBS done on 09/27 revealing severe pharyngeal dysphagia and NPO recommended due to silent aspiration.  She underwent PCI of RCA and LAD on 03/22/14 by Dr. Excell Seltzerooper with recommendations for ASA and Plavix for lifelong duration.  She has completed 3 week course of antibiotics for MSSA bacteremia as well as pseudomonas positive right groin wound.   Subjective/Complaints:  No c/os today  Review of Systems - Negative except redness Left ankle  Past Medical History  Diagnosis Date  . Diabetes mellitus   . Peripheral vascular disease   . Hyperlipidemia   . Obesity   . Hypertension   . Arthritis   . Ulcer of ankle     right  . Coronary artery disease    . COPD (chronic obstructive pulmonary disease)   . NSTEMI (non-ST elevated myocardial infarction)      Objective: Vital Signs: Blood pressure 118/52, pulse 67, temperature 97.7 F (36.5 C), temperature source Oral, resp. rate 18, height 5\' 2"  (1.575 m), weight 77.6 kg (171 lb 1.2 oz), SpO2 98.00%. No results found. Results for orders placed during the hospital encounter of 03/27/14 (from the past 72 hour(s))  GLUCOSE, CAPILLARY     Status: Abnormal   Collection Time    03/29/14 11:53 AM      Result Value Ref Range   Glucose-Capillary 300 (*) 70 - 99 mg/dL  GLUCOSE, CAPILLARY     Status: Abnormal   Collection Time    03/29/14  4:03 PM      Result Value Ref Range   Glucose-Capillary 251 (*) 70 - 99 mg/dL  GLUCOSE, CAPILLARY     Status: Abnormal   Collection Time    03/29/14  9:08 PM      Result Value Ref Range   Glucose-Capillary 229 (*) 70 - 99 mg/dL  GLUCOSE, CAPILLARY     Status: Abnormal   Collection Time    03/30/14  6:36 AM      Result Value Ref Range   Glucose-Capillary 179 (*) 70 - 99 mg/dL  GLUCOSE, CAPILLARY     Status: Abnormal   Collection Time    03/30/14 12:16 PM      Result Value Ref Range   Glucose-Capillary 208 (*) 70 - 99 mg/dL  Comment 1 Notify RN    GLUCOSE, CAPILLARY     Status: Abnormal   Collection Time    03/30/14  4:25 PM      Result Value Ref Range   Glucose-Capillary 187 (*) 70 - 99 mg/dL   Comment 1 Notify RN    GLUCOSE, CAPILLARY     Status: Abnormal   Collection Time    03/30/14  9:12 PM      Result Value Ref Range   Glucose-Capillary 157 (*) 70 - 99 mg/dL   Comment 1 Notify RN    GLUCOSE, CAPILLARY     Status: Abnormal   Collection Time    03/31/14  7:13 AM      Result Value Ref Range   Glucose-Capillary 195 (*) 70 - 99 mg/dL   Comment 1 Notify RN    GLUCOSE, CAPILLARY     Status: Abnormal   Collection Time    03/31/14 11:08 AM      Result Value Ref Range   Glucose-Capillary 222 (*) 70 - 99 mg/dL   Comment 1 Notify RN     GLUCOSE, CAPILLARY     Status: Abnormal   Collection Time    03/31/14  4:46 PM      Result Value Ref Range   Glucose-Capillary 189 (*) 70 - 99 mg/dL  GLUCOSE, CAPILLARY     Status: Abnormal   Collection Time    03/31/14 10:15 PM      Result Value Ref Range   Glucose-Capillary 167 (*) 70 - 99 mg/dL    Patient Vitals for the past 24 hrs:  BP Temp Temp src Pulse Resp SpO2 Weight  04/01/14 0617 118/52 mmHg 97.7 F (36.5 C) Oral 67 18 98 % 77.6 kg (171 lb 1.2 oz)  03/31/14 2218 116/49 mmHg 98.1 F (36.7 C) Oral 68 18 97 % -     Intake/Output Summary (Last 24 hours) at 04/01/14 0917 Last data filed at 03/31/14 1900  Gross per 24 hour  Intake    360 ml  Output      0 ml  Net    360 ml     Head: Normocephalic and atraumatic.  Eyes: Pupils are equal, round, and reactive to light.  Neck: Neck supple.   Cardiovascular: Normal rate. Normal rhythm. No murmurs  Respiratory: Effort normal and breath sounds normal. No respiratory distress. She has no wheezes.  GI: Soft. Bowel sounds are normal.  Musculoskeletal: She exhibits no edema and no tenderness. BLE with evidence of muscle atrophy. 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 Externally rotated, pes planus, Right 2nd toe amp Extr- no edema; stasis changes Neurological: She is alert and oriented to person, place, and time.   Psychiatric: She has a normal mood and affect. Her behavior is normal. Judgment and thought content normal.    Medical Problem List and Plan:   1. Functional deficits secondary to deconditioning after multiple medical issues/sepsis/respiratory failure   2. DVT Prophylaxis/Anticoagulation: Pharmaceutical: Lovenox   3. Chronic pain/Pain Management: continue Neurontin tid. Used fentanyl 100 mg as well as oxycodone 30 mg prn at home for chronic bilateral knee as well as neuropathic foot pain.   4. Mood: team to provide ego support. Continue Lexapro. LCSW to follow for support and evaluation.    5.  NSTEMI s/p PCI RCA, CAD and CFX DES: Continue ASA/Plavix. On Imdur, bisoprolol and Lipitor.   6. Acute systolic heart failure: Monitor daily weights and for other signs of overload. Continue Zebeta,  lasix 40 mg/day, Cozaar 25 mg/day and aldactone 12. 5mg  /day. Monitor for tolerance of medications.   7.  DM type 2 with neuropathy: Hgb A1c- 7.2. Will monitor BS with ac/hs checks. Continue lantus insulin 20 units daily. SSI for elevated BS.   8.. Severe protein malnutrition: Albumin-2.4. Continue ensure supplements bid.   9.  AECOPD: Resolved--taper off prednisone.      LOS (Days) 5 A FACE TO FACE EVALUATION WAS PERFORMED  Rogelia BogaKWIATKOWSKI,PETER FRANK 04/01/2014, 9:13 AM

## 2014-04-02 ENCOUNTER — Inpatient Hospital Stay (HOSPITAL_COMMUNITY): Payer: BC Managed Care – PPO | Admitting: *Deleted

## 2014-04-02 ENCOUNTER — Inpatient Hospital Stay (HOSPITAL_COMMUNITY): Payer: BC Managed Care – PPO | Admitting: Physical Therapy

## 2014-04-02 LAB — GLUCOSE, CAPILLARY
Glucose-Capillary: 172 mg/dL — ABNORMAL HIGH (ref 70–99)
Glucose-Capillary: 226 mg/dL — ABNORMAL HIGH (ref 70–99)
Glucose-Capillary: 183 mg/dL — ABNORMAL HIGH (ref 70–99)
Glucose-Capillary: 164 mg/dL — ABNORMAL HIGH (ref 70–99)
Glucose-Capillary: 154 mg/dL — ABNORMAL HIGH (ref 70–99)

## 2014-04-02 NOTE — Progress Notes (Signed)
Patient ID: [name], female   DOB: 1956/05/29, 58 y.o.   MRN: [id number]  Patient ID: [name], female   DOB: 1956/05/29, 58 y.o.   MRN: [id number]  04/02/14.  [age] y.o. 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 She was admitted on 03/05/14 with hypotension, tachycardia, fever of 101.9, leucocytosis and significant Left flank swelling.  She was started on IV antibiotics for sepsis and was found to have left inguinal hematoma on CT. Patient was noted to have positive troponin's due to NSTEMI and started on IV heparin with serial CBC for monitoring. Right heel culture positive for pseudomonas and BC X 2 positive for MSSA. TEE negative for thrombus or vegetations.  Hospital course complicated by respiratory failure requiring intubation on 09/14 to 9/19 and reintubation 09/21 to 9/23. She underwent cardiac cath on 09/18 by Dr. Eldridge DaceVaranasi revealing severe 3 vessel CAD with EF 10-15%. She was not felt to be a surgical candidate per Dr. Donata ClayVan Trigt. CAD treated medically and acute on chronic systolic CHF treated with diuretics with clinical improvement.   Left transudate effusion tapped for 500 cc clear fluid. Speech therapy following for treatment of dysphagia with s/s of aspiration. MBS done on 09/27 revealing severe pharyngeal dysphagia and NPO recommended due to silent aspiration.  She underwent PCI of RCA and LAD on 03/22/14 by Dr. Excell Seltzerooper with recommendations for ASA and Plavix for lifelong duration.  She has completed 3 week course of antibiotics for MSSA bacteremia as well as pseudomonas positive right groin wound.   Subjective/Complaints:  Only complaint today is mild L ankle pain  Review of Systems - Negative except redness Left ankle  Past Medical History  Diagnosis Date  . Diabetes mellitus   . Peripheral vascular disease   .  Hyperlipidemia   . Obesity   . Hypertension   . Arthritis   . Ulcer of ankle     right  . Coronary artery disease   . COPD (chronic obstructive pulmonary disease)   . NSTEMI (non-ST elevated myocardial infarction)      Objective: Vital Signs: Blood pressure 126/59, pulse 70, temperature 98.2 F (36.8 C), temperature source Oral, resp. rate 18, height 5\' 2"  (1.575 m), weight 77.6 kg (171 lb 1.2 oz), SpO2 100.00%. No results found. Results for orders placed during the hospital encounter of 03/27/14 (from the past 72 hour(s))  GLUCOSE, CAPILLARY     Status: Abnormal   Collection Time    03/30/14 12:16 PM      Result Value Ref Range   Glucose-Capillary 208 (*) 70 - 99 mg/dL   Comment 1 Notify RN    GLUCOSE, CAPILLARY     Status: Abnormal   Collection Time    03/30/14  4:25 PM      Result Value Ref Range   Glucose-Capillary 187 (*) 70 - 99 mg/dL   Comment 1 Notify RN    GLUCOSE, CAPILLARY     Status: Abnormal   Collection Time    03/30/14  9:12 PM      Result Value Ref Range   Glucose-Capillary 157 (*) 70 - 99 mg/dL   Comment 1 Notify RN    GLUCOSE, CAPILLARY     Status: Abnormal   Collection Time    03/31/14  7:13 AM      Result Value Ref Range  Glucose-Capillary 195 (*) 70 - 99 mg/dL   Comment 1 Notify RN    GLUCOSE, CAPILLARY     Status: Abnormal   Collection Time    03/31/14 11:08 AM      Result Value Ref Range   Glucose-Capillary 222 (*) 70 - 99 mg/dL   Comment 1 Notify RN    GLUCOSE, CAPILLARY     Status: Abnormal   Collection Time    03/31/14  4:46 PM      Result Value Ref Range   Glucose-Capillary 189 (*) 70 - 99 mg/dL  GLUCOSE, CAPILLARY     Status: Abnormal   Collection Time    03/31/14 10:15 PM      Result Value Ref Range   Glucose-Capillary 167 (*) 70 - 99 mg/dL  GLUCOSE, CAPILLARY     Status: Abnormal   Collection Time    04/01/14 11:37 AM      Result Value Ref Range   Glucose-Capillary 136 (*) 70 - 99 mg/dL   Comment 1 Notify RN    GLUCOSE,  CAPILLARY     Status: Abnormal   Collection Time    04/01/14  4:37 PM      Result Value Ref Range   Glucose-Capillary 224 (*) 70 - 99 mg/dL   Comment 1 Notify RN    GLUCOSE, CAPILLARY     Status: Abnormal   Collection Time    04/01/14  9:09 PM      Result Value Ref Range   Glucose-Capillary 132 (*) 70 - 99 mg/dL  GLUCOSE, CAPILLARY     Status: Abnormal   Collection Time    04/02/14  7:43 AM      Result Value Ref Range   Glucose-Capillary 172 (*) 70 - 99 mg/dL   Comment 1 Notify RN      Patient Vitals for the past 24 hrs:  BP Temp Temp src Pulse Resp SpO2  04/02/14 0452 126/59 mmHg 98.2 F (36.8 C) Oral 70 18 100 %  04/01/14 2112 97/74 mmHg 98.2 F (36.8 C) Oral 65 18 98 %  04/01/14 1412 90/59 mmHg 98.2 F (36.8 C) Oral 70 17 100 %     Intake/Output Summary (Last 24 hours) at 04/02/14 0810 Last data filed at 04/01/14 1800  Gross per 24 hour  Intake    720 ml  Output      0 ml  Net    720 ml     Head: Normocephalic and atraumatic.  Eyes: Pupils are equal, round, and reactive to light.  Neck: Neck supple.   Cardiovascular: Normal rate. Normal rhythm. No murmurs  Respiratory: Effort normal and breath sounds normal. No respiratory distress. She has no wheezes.  GI: Soft. Bowel sounds are normal.  Musculoskeletal: She exhibits no edema and no tenderness. BLE with evidence of muscle atrophy. 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 Externally rotated, pes planus, Right 2nd toe amp.  R ankle slightly tender to palpation Extr- no edema; stasis changes Neurological: She is alert and oriented to person, place, and time.   Psychiatric: She has a normal mood and affect. Her behavior is normal. Judgment and thought content normal.    Medical Problem List and Plan:   1. Functional deficits secondary to deconditioning after multiple medical issues/sepsis/respiratory failure   2. DVT Prophylaxis/Anticoagulation: Pharmaceutical: Lovenox   3. Chronic pain/Pain  Management: continue Neurontin tid. Used fentanyl 100 mg as well as oxycodone 30 mg prn at home for chronic bilateral knee as well  as neuropathic foot pain.   4. Mood: team to provide ego support. Continue Lexapro. LCSW to follow for support and evaluation.    5. NSTEMI s/p PCI RCA, CAD and CFX DES: Continue ASA/Plavix. On Imdur, bisoprolol and Lipitor.   6. Systolic heart failure: Monitor daily weights and for other signs of overload. Continue Zebeta, lasix 40 mg/day, Cozaar 25 mg/day and aldactone 12. 5mg  /day. Monitor for tolerance of medications.   7.  DM type 2 with neuropathy: Hgb A1c- 7.2. Will monitor BS with ac/hs checks. Continue lantus insulin 20 units daily. SSI for elevated BS.   8.. Severe protein malnutrition: Albumin-2.4. Continue ensure supplements bid.   9.  AECOPD: Resolved- off prednisone.      LOS (Days) 6 A FACE TO FACE EVALUATION WAS PERFORMED  Rogelia BogaKWIATKOWSKI,Uel Davidow FRANK 04/02/2014, 8:10 AM

## 2014-04-02 NOTE — Progress Notes (Signed)
Occupational Therapy Session Note  Patient Details  Name: Kaitlin RainsCheryl C Segar MRN: [id number] Date of Birth: [birth year]  Today's Date: 04/02/2014 OT Individual Time:  -  1030-1100 (30 min)      Short Term Goals: Week 1:  OT Short Term Goal 1 (Week 1): Pt will complete a stand pivot transfer from w/c to toilet with supervision. OT Short Term Goal 2 (Week 1): Pt will be able to walk 15 feet into bathroom with RW with min A. OT Short Term Goal 3 (Week 1): Pt will be able to bathe with supervision. OT Short Term Goal 4 (Week 1): Pt will be able to don her shoes with supervision. OT Short Term Goal 5 (Week 1): Pt will be able to make her bed using RW for support with min A.  Skilled Therapeutic Interventions/Progress Updates:    Addressed balance, strength, transfers, endurance and  UE functional use.  Pt, with NT upon OT arrival.  Transferred to shower bench with min assist.  Pt. Stood to clean peri with min assist for balance and OT assisted with washing posterior peri for thoroughness.Transferred back to wc and dressed self with set up and increased time.  Stood with mod assist to don pants.  Pt. Completed grooming at sink with set up. Pt. Left in room with all needs in reach.     Therapy Documentation Precautions:  Precautions Precautions: Fall Precaution Comments: fell 9/28 with nursing (missed EOB when sitting from stedy) Restrictions Weight Bearing Restrictions: No      Pain: Pain Assessment Pain Score:3/10  Right Ankle ADL: ADL ADL Comments: Refer to FIM        See FIM for current functional status  Therapy/Group: Individual Therapy  Humberto Sealsdwards, Itzae Miralles J 04/02/2014, 11:09 AM

## 2014-04-02 NOTE — Plan of Care (Signed)
Problem: RH PAIN MANAGEMENT Goal: RH STG PAIN MANAGED AT OR BELOW PT'S PAIN GOAL <3  Outcome: Progressing Pain in L ankle controlled with prn oxy IR 10mg  and occasional ultram 50mg .

## 2014-04-02 NOTE — Progress Notes (Signed)
Physical Therapy Session Note  Patient Details  Name: [name] MRN: [id number] Date of Birth: 1956-02-16  Today's Date: 04/02/2014 PT Individual Time: 1400-1430 PT Individual Time Calculation (min): 30 min   Short Term Goals: Week 1:  PT Short Term Goal 1 (Week 1): STG=LTG due to LOS, S overall  Skilled Therapeutic Interventions/Progress Updates:   Pt received in w/c with family present.  Donned ace wrap LLE and bilat shoes.  Pt performed w/c mobility x 150' to/from room with supervision with intermittent verbal cues to self correct tendency to veer R into wall.  Performed gait training with RW x 30' x 2 reps with min A and focus on upright trunk with shoulder depression and terminal hip extension to decrease reliance on UE.  Performed stair negotiation training up/down 3 stairs with 2 rails, forwards to ascend, retro to descend with mod A to perform step to sequence; required facilitation of full R lateral weight shift and activation of glutes during stance to maintain upright trunk during LLE advancement to next step.  Performed dynamic standing and pre-gait training with focus on lateral weight shifting and unweighting each LE to advance forwards and retro without UE support on RW; required min-mod A to maintain balance and pt unable to fully clear foot from floor.  Returned to room to continue to visit with family.  Therapy Documentation Precautions:  Precautions Precautions: Fall Precaution Comments: fell 9/28 with nursing (missed EOB when sitting from stedy) Restrictions Weight Bearing Restrictions: No Vital Signs: Therapy Vitals Temp: 98.1 F (36.7 C) Temp Source: Oral Pulse Rate: 73 Resp: 18 BP: 93/63 mmHg Patient Position (if appropriate): Sitting Oxygen Therapy SpO2: 99 % Pain: Pain Assessment Pain Assessment: No/denies pain Pain Score: 5  Pain Type: Acute pain Pain Location: Ankle Pain Orientation: Left Pain Descriptors / Indicators: Aching;Sore Pain  Frequency: Constant Pain Onset: With Activity Patients Stated Pain Goal: 2 Pain Intervention(s): Medication (See eMAR) Locomotion : Ambulation Ambulation/Gait Assistance: 4: Min assist Wheelchair Mobility Distance: 150   See FIM for current functional status  Therapy/Group: Individual Therapy  Edman CircleHall, [name] [facility]ette 04/02/2014, 2:47 PM

## 2014-04-03 ENCOUNTER — Inpatient Hospital Stay (HOSPITAL_COMMUNITY): Payer: BC Managed Care – PPO

## 2014-04-03 ENCOUNTER — Ambulatory Visit: Payer: BC Managed Care – PPO | Admitting: Surgery

## 2014-04-03 ENCOUNTER — Ambulatory Visit (HOSPITAL_COMMUNITY): Payer: BC Managed Care – PPO | Admitting: Speech Pathology

## 2014-04-03 DIAGNOSIS — M25572 Pain in left ankle and joints of left foot: Secondary | ICD-10-CM

## 2014-04-03 LAB — BASIC METABOLIC PANEL
Anion gap: 16 — ABNORMAL HIGH (ref 5–15)
BUN: 19 mg/dL (ref 6–23)
CO2: 26 mEq/L (ref 19–32)
CREATININE: 0.81 mg/dL (ref 0.50–1.10)
Calcium: 9.6 mg/dL (ref 8.4–10.5)
Chloride: 94 mEq/L — ABNORMAL LOW (ref 96–112)
GFR, EST NON AFRICAN AMERICAN: 80 mL/min — AB (ref >90)
Glucose, Bld: 155 mg/dL — ABNORMAL HIGH (ref 70–99)
POTASSIUM: 5.1 meq/L (ref 3.7–5.3)
Sodium: 136 mEq/L — ABNORMAL LOW (ref 137–147)

## 2014-04-03 LAB — GLUCOSE, CAPILLARY
Glucose-Capillary: 166 mg/dL — ABNORMAL HIGH (ref 70–99)
Glucose-Capillary: 137 mg/dL — ABNORMAL HIGH (ref 70–99)
Glucose-Capillary: 120 mg/dL — ABNORMAL HIGH (ref 70–99)
Glucose-Capillary: 104 mg/dL — ABNORMAL HIGH (ref 70–99)

## 2014-04-03 LAB — CBC WITH DIFFERENTIAL/PLATELET
BASOS ABS: 0 10*3/uL (ref 0.0–0.1)
Basophils Relative: 0 % (ref 0–1)
Eosinophils Absolute: 0.2 10*3/uL (ref 0.0–0.7)
Eosinophils Relative: 3 % (ref 0–5)
HCT: 37 % (ref 36.0–46.0)
Hemoglobin: 11.5 g/dL — ABNORMAL LOW (ref 12.0–15.0)
LYMPHS ABS: 1.8 10*3/uL (ref 0.7–4.0)
Lymphocytes Relative: 31 % (ref 12–46)
MCH: 24.8 pg — ABNORMAL LOW (ref 26.0–34.0)
MCHC: 31.1 g/dL (ref 30.0–36.0)
MCV: 79.9 fL (ref 78.0–100.0)
MONO ABS: 0.7 10*3/uL (ref 0.1–1.0)
MONOS PCT: 12 % (ref 3–12)
Neutro Abs: 3 10*3/uL (ref 1.7–7.7)
Neutrophils Relative %: 54 % (ref 43–77)
PLATELETS: 251 10*3/uL (ref 150–400)
RBC: 4.63 MIL/uL (ref 3.87–5.11)
RDW: 21.6 % — AB (ref 11.5–15.5)
WBC: 5.7 10*3/uL (ref 4.0–10.5)

## 2014-04-03 LAB — CREATININE, SERUM
Creatinine, Ser: 0.49 mg/dL — ABNORMAL LOW (ref 0.50–1.10)
GFR calc non Af Amer: >90 mL/min (ref >90)

## 2014-04-03 MED ORDER — VANCOMYCIN HCL IN DEXTROSE 750-5 MG/150ML-% IV SOLN
750.0000 mg | Freq: Two times a day (BID) | INTRAVENOUS | Status: DC
  Filled 2014-04-03 (×2): qty 150

## 2014-04-03 MED ORDER — VANCOMYCIN HCL IN DEXTROSE 1-5 GM/200ML-% IV SOLN: 1000.0000 mg | Freq: Two times a day (BID) | INTRAVENOUS | Status: DC

## 2014-04-03 MED ORDER — VANCOMYCIN HCL IN DEXTROSE 1-5 GM/200ML-% IV SOLN
1000.0000 mg | Freq: Two times a day (BID) | INTRAVENOUS | Status: DC
  Administered 2014-04-03 – 2014-04-06 (×6): 1000 mg via INTRAVENOUS
  Filled 2014-04-03 (×7): qty 200

## 2014-04-03 MED ORDER — GLIMEPIRIDE 2 MG PO TABS
2.0000 mg | ORAL_TABLET | Freq: Every day | ORAL | Status: DC
  Administered 2014-04-03 – 2014-04-11 (×8): 2 mg via ORAL
  Filled 2014-04-03 (×11): qty 1

## 2014-04-03 NOTE — Progress Notes (Signed)
Left ankle with increase in pain. See with PT as now patient has erythema encompassing medial malleolus with bogginess. Tender to touch but no fluid expressed. Will check CBC with diff. Will await evaluation by Dr. Jorene Guestuda--question culture prior to initiating antibiotics. Await Xray to rule out underlying infection.

## 2014-04-03 NOTE — Progress Notes (Signed)
Speech Language Pathology Daily Session Note  Patient Details  Name: [name] MRN: [id number] Date of Birth: 05-30-1956  Today's Date: 04/03/2014 SLP Individual Time: 0800-0900 SLP Individual Time Calculation (min): 60 min  Short Term Goals: Week 1: SLP Short Term Goal 1 (Week 1): Pt will consume presentations of her currently prescribed diet with no overt s/s of aspiration and supervision cues for use of swallowing precautions.  SLP Short Term Goal 2 (Week 1): Pt will return demonstration of pharyngeal strengthening exercises with supervision  SLP Short Term Goal 3 (Week 1): Pt will tolerate trials of advanced consistencies with no overt s/s of aspiration  SLP Short Term Goal 4 (Week 1): Pt will participate in a repeat objective swallowing study to determine readiness for diet advancement.   SLP Short Term Goal 4 - Progress (Week 1): Met SLP Short Term Goal 5 (Week 1): Pt will consume water and ice chips while utilizing precautions from the water protocol with supervision.  Skilled Therapeutic Interventions:  Pt was seen for skilled speech therapy targeting dysphagia goals.  Upon arrival, pt was partially reclined in bed, awake, alert, and agreeable to participate in [name].  Pt seated herself at the edge of the bed independently and maintained appropriate posture while consuming presentations of her currently prescribed diet.  Pt exhibited no overt s/s of aspiration with dys 3 solids and nectar thick liquids via cup sips.  Additionally, pt was supervision level assist  for use of swallowing precautions during her meal.  Pt complete 10-20 repetitions each of pharyngeal strengthening exercises, including effortful swallows, laryngeal pitch glides, Mendelsohn, supraglottic swallow, and forceful vocal fold adduction.  Additionally, SLP completed skilled observations with trials of regular water following thorough oral care with pt exhibiting no overt s/s of aspiration and min cues for use of  swallowing precautions. Discussed decreasing pt to 3x/week for ST due to progress made while inpatient and pt's level of independence in managing her swallowing precautions.  Pt agreeable.    FIM:  Comprehension Comprehension Mode: Auditory Comprehension: 6-Follows complex conversation/direction: With extra time/assistive device Expression Expression Mode: Verbal Expression: 6-Expresses complex ideas: With extra time/assistive device Social Interaction Social Interaction: 7-Interacts appropriately with others - No medications needed. Problem Solving Problem Solving: 5-Solves basic problems: With no assist Memory Memory: 6-More than reasonable amt of time FIM - Eating Eating Activity: 5: Supervision/cues  Pain Pain Assessment Pain Assessment: No/denies pain  Therapy/Group: Individual Therapy  [name], [name] 04/03/2014, 12:20 PM

## 2014-04-03 NOTE — Consult Note (Signed)
Reason for Consult: Pain and swelling and drainage anterior medial aspect left ankle Referring Physician: Dr. Marney Doctor is an 58 y.o. female.  HPI: Patient is a 58 year old woman who is status post a remote open reduction and total fixation of her left ankle. Patient is been having increasing pain with weightbearing on the left ankle. She has had redness swelling and is seen today with drainage from the anterior medial aspect of her ankle.  Past Medical History  Diagnosis Date  . Diabetes mellitus   . Peripheral vascular disease   . Hyperlipidemia   . Obesity   . Hypertension   . Arthritis   . Ulcer of ankle     right  . Coronary artery disease   . COPD (chronic obstructive pulmonary disease)   . NSTEMI (non-ST elevated myocardial infarction)     Past Surgical History  Procedure Laterality Date  . Cholecystectomy    . Femoral-tibial bypass graft Right 09/27/2013    Procedure: BYPASS GRAFT FEMORAL-TIBIAL ARTERY-RIGHT;  Surgeon: Serafina Mitchell, MD;  Location: [facility] OR;  Service: Vascular;  Laterality: Right;  . Abdominal aorta stent Right   . Bypass graft popliteal to tibial Right   . Coronary angioplasty with stent placement      proximcal RCA stent ~ 2001  . Amputation Right 10/14/2013    Procedure: AMPUTATION RAY;  Surgeon: Newt Minion, MD;  Location: [place];  Service: Orthopedics;  Laterality: Right;  Right 2nd Ray Amputation, [place] Graft Right Ankle  . Ankle surgery Left     pinnining for fracture    Family History  Problem Relation Age of Onset  . Cancer Mother   . Heart disease Father   . Hyperlipidemia Father   . Hypertension Father   . Other Father     varicose veins  . Heart attack Father   . Heart disease Sister   . Hyperlipidemia Sister   . Hypertension Sister   . Other Sister     varicose veins  . Heart attack Sister   . Heart disease Brother   . Hypertension Brother   . Hyperlipidemia Brother   . Other Brother     varicose veins  .  Heart attack Brother     Social History:  reports that she quit smoking about 3 weeks ago. Her smoking use included Cigarettes. She has a 60 pack-year smoking history. She has never used smokeless tobacco. She reports that she does not drink alcohol or use illicit drugs.  Allergies:  Allergies  Allergen Reactions  . Penicillins Hives    Tolerates cefepime  . Adhesive [Tape] Rash    Medications: I have reviewed the patient's current medications.  Results for orders placed during the hospital encounter of 03/27/14 (from the past 48 hour(s))  GLUCOSE, CAPILLARY     Status: Abnormal   Collection Time    04/01/14  9:09 PM      Result Value Ref Range   Glucose-Capillary 132 (*) 70 - 99 mg/dL  GLUCOSE, CAPILLARY     Status: Abnormal   Collection Time    04/02/14  7:43 AM      Result Value Ref Range   Glucose-Capillary 172 (*) 70 - 99 mg/dL   Comment 1 Notify RN    GLUCOSE, CAPILLARY     Status: Abnormal   Collection Time    04/02/14 11:37 AM      Result Value Ref Range   Glucose-Capillary 226 (*) 70 - 99 mg/dL  GLUCOSE, CAPILLARY     Status: Abnormal   Collection Time    04/02/14  4:07 PM      Result Value Ref Range   Glucose-Capillary 183 (*) 70 - 99 mg/dL   Comment 1 Notify RN    GLUCOSE, CAPILLARY     Status: Abnormal   Collection Time    04/02/14  9:20 PM      Result Value Ref Range   Glucose-Capillary 164 (*) 70 - 99 mg/dL  GLUCOSE, CAPILLARY     Status: Abnormal   Collection Time    04/02/14  9:53 PM      Result Value Ref Range   Glucose-Capillary 154 (*) 70 - 99 mg/dL  CREATININE, SERUM     Status: Abnormal   Collection Time    04/03/14  5:31 AM      Result Value Ref Range   Creatinine, Ser 0.49 (*) 0.50 - 1.10 mg/dL   GFR calc non Af Amer >90  >90 mL/min   GFR calc Af Amer >90  >90 mL/min   Comment: (NOTE)     The eGFR has been calculated using the CKD EPI equation.     This calculation has not been validated in all clinical situations.     eGFR's  persistently <90 mL/min signify possible Chronic Kidney     Disease.  GLUCOSE, CAPILLARY     Status: Abnormal   Collection Time    04/03/14  7:01 AM      Result Value Ref Range   Glucose-Capillary 166 (*) 70 - 99 mg/dL  GLUCOSE, CAPILLARY     Status: Abnormal   Collection Time    04/03/14 12:16 PM      Result Value Ref Range   Glucose-Capillary 137 (*) 70 - 99 mg/dL  CBC WITH DIFFERENTIAL     Status: Abnormal   Collection Time    04/03/14 12:44 PM      Result Value Ref Range   WBC 5.7  4.0 - 10.5 K/uL   RBC 4.63  3.87 - 5.11 MIL/uL   Hemoglobin 11.5 (*) 12.0 - 15.0 g/dL   HCT 37.0  36.0 - 46.0 %   MCV 79.9  78.0 - 100.0 fL   MCH 24.8 (*) 26.0 - 34.0 pg   MCHC 31.1  30.0 - 36.0 g/dL   RDW 21.6 (*) 11.5 - 15.5 %   Platelets 251  150 - 400 K/uL   Neutrophils Relative % 54  43 - 77 %   Lymphocytes Relative 31  12 - 46 %   Monocytes Relative 12  3 - 12 %   Eosinophils Relative 3  0 - 5 %   Basophils Relative 0  0 - 1 %   Neutro Abs 3.0  1.7 - 7.7 K/uL   Lymphs Abs 1.8  0.7 - 4.0 K/uL   Monocytes Absolute 0.7  0.1 - 1.0 K/uL   Eosinophils Absolute 0.2  0.0 - 0.7 K/uL   Basophils Absolute 0.0  0.0 - 0.1 K/uL   WBC Morphology ATYPICAL LYMPHOCYTES     Smear Review LARGE PLATELETS PRESENT    BASIC METABOLIC PANEL     Status: Abnormal   Collection Time    04/03/14 12:44 PM      Result Value Ref Range   Sodium 136 (*) 137 - 147 mEq/L   Potassium 5.1  3.7 - 5.3 mEq/L   Chloride 94 (*) 96 - 112 mEq/L   CO2 26  19 - 32 mEq/L  Glucose, Bld 155 (*) 70 - 99 mg/dL   BUN 19  6 - 23 mg/dL   Creatinine, Ser 0.81  0.50 - 1.10 mg/dL   Comment: REPEATED TO VERIFY     DELTA CHECK NOTED   Calcium 9.6  8.4 - 10.5 mg/dL   GFR calc non Af Amer 80 (*) >90 mL/min   GFR calc Af Amer >90  >90 mL/min   Comment: (NOTE)     The eGFR has been calculated using the CKD EPI equation.     This calculation has not been validated in all clinical situations.     eGFR's persistently <90 mL/min signify  possible Chronic Kidney     Disease.   Anion gap 16 (*) 5 - 15  GLUCOSE, CAPILLARY     Status: Abnormal   Collection Time    04/03/14  4:05 PM      Result Value Ref Range   Glucose-Capillary 120 (*) 70 - 99 mg/dL    Dg Ankle 2 Views Left  04/03/2014   CLINICAL DATA:  Ankle pain.  EXAM: LEFT ANKLE - 2 VIEW  COMPARISON:  03/05/2014.  FINDINGS: Patient has had prior open reduction and internal fixation of the distal tibia and fibula. Good anatomic alignment. Degenerative change. No acute abnormality. Vascular calcification.  IMPRESSION: 1. Open reduction internal fixation distal tibia and fibula. Good anatomic alignment. 2. Severe degenerative change left ankle. 3. No acute abnormality.   Electronically Signed   By: Thomas  Register   On: 04/03/2014 14:07    Review of Systems  All other systems reviewed and are negative.  Blood pressure 100/58, pulse 72, temperature 97.5 F (36.4 C), temperature source Oral, resp. rate 16, height 5' 2" (1.575 m), weight 77.5 kg (170 lb 13.7 oz), SpO2 99.00%. Physical Exam On examination patient has purulence draining from the anterior medial aspect of her ankle. This is directly over the area of previous screws for internal fixation. The screws appear to penetrate into the joint and into the talus. Radiographs are suggestive of some lytic lesions around the screws. The purulent drainage was decompressed at bedside. Approximately 5 cc was drained. 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  Plan: Secondary to the hardware I do not feel that an MRI scan would provide us any information. I will obtain a CT scan to further evaluate the ankle joint. Anticipate patient will need surgery this week.. We'll start her on IV antibiotics at this time.  Mayerly Kaman  V 04/03/2014, 7:04 PM      

## 2014-04-03 NOTE — Progress Notes (Signed)
Physical Therapy Session Note  Patient Details  Name: [name] MRN: [id number] Date of Birth: December 15, 1955  Today's Date: 04/03/2014 PT Individual Time: 1100-1200 PT Individual Time Calculation (min): 60 min   Short Term Goals: Week 1:  PT Short Term Goal 1 (Week 1): STG=LTG due to LOS, S overall Week 2:     Skilled Therapeutic Interventions/Progress Updates:  W/c propulsion for activity tolerance x 150' x 2 with supervision.  L superio-medial ankle soreness no worse, but skin is very erythematous with a head developing, likely to break down.  PA called; she now suspects cellulitis; Xray and consult from Dr. Lajoyce Cornersuda pending. PA applied foam dressing and ok'd continued wt bearing and ambulation.  neuromuscular re-education via demo, VCS for self stretching bil hamstrings and heel cords; sit>< stand without use of hands to bias LEs muscle recruitment , terminal hip extension in static stander with and without use of hands, wt shifting L><R in stander. Pt continues with very poor hip muscle recruitment bil during sit>< stand and static standing.  Gait with RW x 90' with min guard assist.     Therapy Documentation Precautions:  Precautions Precautions: Fall Precaution Comments: fell 9/28 with nursing (missed EOB when sitting from stedy) Restrictions Weight Bearing Restrictions: No   Pain: Pain Assessment Pain Assessment: 0-10 Pain Score: 5  Pain Type: Acute pain Pain Location: Ankle Pain Orientation: Left Pain Descriptors / Indicators: Aching Pain Frequency: Intermittent Pain Onset: On-going Patients Stated Pain Goal: 2 Pain Intervention(s): Medication (See eMAR);Shower;Elevated extremity;Repositioned   Locomotion : Ambulation Ambulation/Gait Assistance: 4: Min Production managerguard Wheelchair Mobility Distance: 150     See FIM for current functional status  Therapy/Group: Individual Therapy  [name] 04/03/2014, 12:13 PM

## 2014-04-03 NOTE — Progress Notes (Addendum)
L medial ankle area of swelling expressed by MD; placed 4x4s and ace wrap per MD verbal order. Mick SellShannon Jayleon Mcfarlane RN

## 2014-04-03 NOTE — Plan of Care (Signed)
Problem: RH PAIN MANAGEMENT Goal: RH STG PAIN MANAGED AT OR BELOW PT'S PAIN GOAL <3  Outcome: Not Progressing Reports increased pain in L ankle, taking oxy IR 10mg  q4h. Xray ordered today to assess ankle and source of pain.

## 2014-04-03 NOTE — Progress Notes (Signed)
ANTIBIOTIC CONSULT NOTE - INITIAL  Pharmacy Consult for Vancomycin  Indication: left ankle abscess  Allergies  Allergen Reactions  . Penicillins Hives    Tolerates cefepime  . Adhesive [Tape] Rash    Patient Measurements: Height: 5\' 2"  (157.5 cm) Weight: 170 lb 13.7 oz (77.5 kg) IBW/kg (Calculated) : 50.1 Adjusted Body Weight: 61 kg  Vital Signs: Temp: 97.5 F (36.4 C) (10/12 1500) Temp Source: Oral (10/12 1500) BP: 100/58 mmHg (10/12 1500) Pulse Rate: 72 (10/12 1500) Intake/Output from previous day: 10/11 0701 - 10/12 0700 In: 720 [P.O.:720] Out: -  Intake/Output from this shift:    Labs:  Recent Labs  04/03/14 0531 04/03/14 1244  WBC  --  5.7  HGB  --  11.5*  PLT  --  251  CREATININE 0.49* 0.81   Estimated Creatinine Clearance: 74.8 ml/min (by C-G formula based on Cr of 0.81). No results found for this basename: VANCOTROUGH, Leodis BinetVANCOPEAK, VANCORANDOM, GENTTROUGH, GENTPEAK, GENTRANDOM, TOBRATROUGH, TOBRAPEAK, TOBRARND, AMIKACINPEAK, AMIKACINTROU, AMIKACIN,  in the last 72 hours   Microbiology: Recent Results (from the past 720 hour(s))  URINE CULTURE     Status: None   Collection Time    03/05/14  5:05 PM      Result Value Ref Range Status   Specimen Description URINE, RANDOM   Final   Special Requests NONE   Final   Culture  Setup Time     Final   Value: 03/05/2014 18:00     Performed at [facility]   Colony Count     Final   Value: NO GROWTH     [facility]formed [facility]   Culture     Final   Value: NO GROWTH     Performed at [facility]   Report Status 03/07/2014 FINAL   Final  CULTURE, BLOOD (ROUTINE X 2)     Status: None   Collection Time    03/05/14  5:12 PM      Result Value Ref Range Status   Specimen Description BLOOD RIGHT ARM   Final   Special Requests BOTTLES DRAWN AEROBIC AND ANAEROBIC 7CC EA   Final   Culture  Setup Time     Final   Value: 03/06/2014 00:49     Performed at [facility]   Culture      Final   Value: STAPHYLOCOCCUS AUREUS     Note: RIFAMPIN AND GENTAMICIN SHOULD NOT BE USED AS SINGLE DRUGS FOR TREATMENT OF STAPH INFECTIONS.     Note: Gram Stain Report Called to,Read Back By and Verified With: THERESA@1 :30PM ON 03/06/14 BY DANTS     Performed at [facility]   Report Status 03/08/2014 FINAL   Final   Organism ID, Bacteria STAPHYLOCOCCUS AUREUS   Final  CULTURE, BLOOD (ROUTINE X 2)     Status: None   Collection Time    03/05/14  6:05 PM      Result Value Ref Range Status   Specimen Description BLOOD RIGHT FOREARM   Final   Special Requests BOTTLES DRAWN AEROBIC AND ANAEROBIC 10CC EA   Final   Culture  Setup Time     Final   Value: 03/06/2014 00:49     Performed at [facility]   Culture     Final   Value: STAPHYLOCOCCUS AUREUS     Note: SUSCEPTIBILITIES PERFORMED ON PREVIOUS CULTURE WITHIN THE LAST 5 DAYS.     Note: Gram Stain Report Called to,Read Back By and  Verified With: THERESA@1 :30PM ON 03/06/14 BY DANTS     Performed at [facility]   Report Status 03/08/2014 FINAL   Final  MRSA PCR SCREENING     Status: None   Collection Time    03/05/14 10:28 PM      Result Value Ref Range Status   MRSA by PCR NEGATIVE  NEGATIVE Final   Comment:            The GeneXpert MRSA Assay (FDA     approved for NASAL specimens     only), is one component of a     comprehensive MRSA colonization     surveillance program. It is not     intended to diagnose MRSA     infection nor to guide or     monitor treatment for     MRSA infections.  WOUND CULTURE     Status: None   Collection Time    03/05/14 11:52 PM      Result Value Ref Range Status   Specimen Description WOUND RIGHT GROIN   Final   Special Requests NONE   Final   Gram Stain     Final   Value: NO WBC SEEN     RARE SQUAMOUS EPITHELIAL CELLS PRESENT     MODERATE GRAM NEGATIVE RODS     Performed at [facility]   Culture     Final   Value: MODERATE PSEUDOMONAS AERUGINOSA     Performed  at [facility]   Report Status 03/08/2014 FINAL   Final   Organism ID, Bacteria PSEUDOMONAS AERUGINOSA   Final  URINE CULTURE     Status: None   Collection Time    03/06/14  9:15 AM      Result Value Ref Range Status   Specimen Description URINE, CATHETERIZED   Final   Special Requests NONE   Final   Culture  Setup Time     Final   Value: 03/06/2014 13:06     Performed at [facility]   Colony Count     Final   Value: NO GROWTH     Performed at [facility]   Culture     Final   Value: NO GROWTH     Performed at [facility]   Report Status 03/07/2014 FINAL   Final  CULTURE, BLOOD (ROUTINE X 2)     Status: None   Collection Time    03/07/14  9:50 AM      Result Value Ref Range Status   Specimen Description BLOOD RIGHT HAND   Final   Special Requests BOTTLES DRAWN AEROBIC AND ANAEROBIC 10CC   Final   Culture  Setup Time     Final   Value: 03/07/2014 16:05     Performed at [facility]   Culture     Final   Value: NO GROWTH 5 DAYS     Performed at [facility]   Report Status 03/13/2014 FINAL   Final  CULTURE, BLOOD (ROUTINE X 2)     Status: None   Collection Time    03/07/14 12:10 PM      Result Value Ref Range Status   Specimen Description BLOOD LEFT NECK   Final   Special Requests BOTTLES DRAWN AEROBIC AND ANAEROBIC 10CC IJ CVC   Final   Culture  Setup Time     Final   Value: 03/07/2014 18:53     Performed at [facility]  Culture     Final   Value: NO GROWTH 5 DAYS     Performed at [facility]   Report Status 03/13/2014 FINAL   Final  CULTURE, BLOOD (ROUTINE X 2)     Status: None   Collection Time    03/14/14 11:06 AM      Result Value Ref Range Status   Specimen Description BLOOD LEFT HAND   Final   Special Requests BOTTLES DRAWN AEROBIC AND ANAEROBIC 10CC   Final   Culture  Setup Time     Final   Value: 03/14/2014 16:45     Performed at [facility]   Culture     Final   Value:  STAPHYLOCOCCUS SPECIES (COAGULASE NEGATIVE)     Note: THE SIGNIFICANCE OF ISOLATING THIS ORGANISM FROM A SINGLE SET OF BLOOD CULTURES WHEN MULTIPLE SETS ARE DRAWN IS UNCERTAIN. PLEASE NOTIFY THE MICROBIOLOGY DEPARTMENT WITHIN ONE WEEK IF SPECIATION AND SENSITIVITIES ARE REQUIRED.     Note: Gram Stain Report Called to,Read Back By and Verified With: MONICA CROSS TAKEN FOR NURSE 12:55AM 03/16/14 THOMI     Performed at [facility]   Report Status 03/17/2014 FINAL   Final  CULTURE, BLOOD (ROUTINE X 2)     Status: None   Collection Time    03/14/14 11:16 AM      Result Value Ref Range Status   Specimen Description BLOOD RIGHT HAND   Final   Special Requests BOTTLES DRAWN AEROBIC AND ANAEROBIC 10CC   Final   Culture  Setup Time     Final   Value: 03/14/2014 16:43     Performed at [facility]   Culture     Final   Value: NO GROWTH 5 DAYS     Performed at [facility]   Report Status 03/20/2014 FINAL   Final  CULTURE, BAL-QUANTITATIVE     Status: None   Collection Time    03/14/14 12:47 PM      Result Value Ref Range Status   Specimen Description BRONCHIAL ALVEOLAR LAVAGE   Final   Special Requests Normal   Final   Gram Stain     Final   Value: FEW WBC PRESENT,BOTH PMN AND MONONUCLEAR     RARE SQUAMOUS EPITHELIAL CELLS PRESENT     NO ORGANISMS SEEN     Performed at [facility] Count     Final   Value: 10,000 COLONIES/ML     Performed at [facility]   Culture     Final   Value: Non-Pathogenic Oropharyngeal-type Flora Isolated.     Performed at [facility]   Report Status 03/16/2014 FINAL   Final  AFB CULTURE WITH SMEAR     Status: None   Collection Time    03/15/14  2:33 PM      Result Value Ref Range Status   Specimen Description FLUID LEFT PLEURAL   Final   Special Requests NONE   Final   Acid Fast Smear     Final   Value: NO ACID FAST BACILLI SEEN     Performed at [facility]   Culture     Final   Value:  CULTURE WILL BE EXAMINED FOR 6 WEEKS BEFORE ISSUING A FINAL REPORT     Performed at [facility]   Report Status PENDING   Incomplete  BODY FLUID CULTURE     Status: None   Collection Time  03/15/14  2:33 PM      Result Value Ref Range Status   Specimen Description FLUID LEFT PLEURAL   Final   Special Requests NONE   Final   Gram Stain     Final   Value: RARE WBC PRESENT, PREDOMINANTLY MONONUCLEAR     NO ORGANISMS SEEN     Performed at [facility]   Culture     Final   Value: NO GROWTH 3 DAYS     Performed at [facility]   Report Status 03/19/2014 FINAL   Final  FUNGUS CULTURE W SMEAR     Status: None   Collection Time    03/15/14  2:33 PM      Result Value Ref Range Status   Specimen Description PLEURAL LEFT   Final   Special Requests NONE   Final   Fungal Smear     Final   Value: NO YEAST OR FUNGAL ELEMENTS SEEN     Performed at [facility]   Culture     Final   Value: CULTURE IN PROGRESS FOR FOUR WEEKS     Performed at [facility]   Report Status PENDING   Incomplete  MRSA PCR SCREENING     Status: None   Collection Time    03/22/14  4:35 PM      Result Value Ref Range Status   MRSA by PCR NEGATIVE  NEGATIVE Final   Comment:            The GeneXpert MRSA Assay (FDA     approved for NASAL specimens     only), is one component of a     comprehensive MRSA colonization     surveillance program. It is not     intended to diagnose MRSA     infection nor to guide or     monitor treatment for     MRSA infections.    Medical History: Past Medical History  Diagnosis Date  . Diabetes mellitus   . Peripheral vascular disease   . Hyperlipidemia   . Obesity   . Hypertension   . Arthritis   . Ulcer of ankle     right  . Coronary artery disease   . COPD (chronic obstructive pulmonary disease)   . NSTEMI (non-ST elevated myocardial infarction)     Medications:  Prescriptions prior to admission  Medication Sig Dispense  Refill  . alprazolam (XANAX) 2 MG tablet Take 1 mg by mouth every 6 (six) hours as needed for sleep or anxiety.       Marland Kitchen aspirin 81 MG chewable tablet Chew 1 tablet (81 mg total) by mouth daily.  30 tablet  0  . atorvastatin (LIPITOR) 80 MG tablet Take 1 tablet (80 mg total) by mouth daily at 6 PM.  30 tablet  0  . bisoprolol (ZEBETA) 5 MG tablet Take 1 tablet (5 mg total) by mouth daily.  30 tablet  0  . clopidogrel (PLAVIX) 75 MG tablet Take 1 tablet (75 mg total) by mouth daily.  30 tablet  0  . escitalopram (LEXAPRO) 10 MG tablet Take 10 mg by mouth daily.      . fentaNYL (DURAGESIC - DOSED MCG/HR) 100 MCG/HR Place 100 mcg onto the skin every 3 (three) days.      . fluticasone (FLONASE) 50 MCG/ACT nasal spray Place 1 spray into both nostrils daily as needed for allergies.       . furosemide (LASIX) 40 MG  tablet Take 1 tablet (40 mg total) by mouth daily.  30 tablet  0  . gabapentin (NEURONTIN) 600 MG tablet Take 600 mg by mouth 3 (three) times daily.      Marland Kitchen glimepiride (AMARYL) 2 MG tablet Take 2 mg by mouth 2 (two) times daily.      . insulin glargine (LANTUS) 100 UNIT/ML injection Inject 0.2 mLs (20 Units total) into the skin daily.  10 mL  11  . losartan (COZAAR) 25 MG tablet Take 1 tablet (25 mg total) by mouth at bedtime.  30 tablet  0  . metFORMIN (GLUCOPHAGE) 500 MG tablet Take 1,000 mg by mouth 2 (two) times daily with a meal.      . oxycodone (ROXICODONE) 30 MG immediate release tablet Take 30 mg by mouth every 4 (four) hours as needed for pain.       Marland Kitchen spironolactone (ALDACTONE) 12.5 mg TABS tablet Take 0.5 tablets (12.5 mg total) by mouth daily.  30 tablet  0  . VESICARE 5 MG tablet Take 5 mg by mouth daily.        Scheduled:  . aspirin  81 mg Oral Daily  . atorvastatin  80 mg Oral q1800  . bisoprolol  5 mg Oral Daily  . clopidogrel  75 mg Oral Daily  . enoxaparin (LOVENOX) injection  30 mg Subcutaneous Q24H  . escitalopram  10 mg Oral Daily  . feeding supplement (ENSURE)  1  Container Oral Q1400  . feeding supplement (GLUCERNA SHAKE)  237 mL Oral Q1500  . furosemide  40 mg Oral Daily  . gabapentin  300 mg Oral TID  . glimepiride  2 mg Oral Q breakfast  . insulin aspart  0-5 Units Subcutaneous QHS  . insulin aspart  0-9 Units Subcutaneous TID WC  . insulin aspart  4 Units Subcutaneous TID WC  . losartan  25 mg Oral QHS  . metFORMIN  1,000 mg Oral BID WC  . MUSCLE RUB   Topical TID AC  . nitroGLYCERIN  0.4 mg Transdermal Q24H  . nystatin   Topical TID  . nystatin cream   Topical BID  . protein supplement  1 scoop Oral TID WC  . spironolactone  12.5 mg Oral Daily   Assessment: 58yo female with swelling and drainage to anterior medial aspect of left ankle. Pharmacy is consulted to dose vancomycin for left ankle abscess. She was recently on cefazolin for MSSA bacteremia and then doxycycline for this left ankle swelling and drainage, which was stopped 03/29/14 by Dr. Luciana Axe. Pt is afebrile, WBC wnl, sCr 0.81 with CrCl > 90 mL/min.  Goal of Therapy:  Vancomycin trough level 10-15 mcg/ml  Plan:  Vancomycin 1000mg  IV q12h Measure antibiotic drug levels at steady state Follow up culture results and renal function  Arlean Hopping. Newman Pies, PharmD Clinical Pharmacist Pager [phone] 04/03/2014,7:22 PM

## 2014-04-03 NOTE — Progress Notes (Signed)
Occupational Therapy Session Note  Patient Details  Name: [name] MRN: [id number] Date of Birth: [birth year]  Today's Date: 04/03/2014 OT Individual Time: 0930-1030 OT Individual Time Calculation (min): 60 min    Short Term Goals: Week 1:  OT Short Term Goal 1 (Week 1): Pt will complete a stand pivot transfer from w/c to toilet with supervision. OT Short Term Goal 2 (Week 1): Pt will be able to walk 15 feet into bathroom with RW with min A. OT Short Term Goal 3 (Week 1): Pt will be able to bathe with supervision. OT Short Term Goal 4 (Week 1): Pt will be able to don her shoes with supervision. OT Short Term Goal 5 (Week 1): Pt will be able to make her bed using RW for support with min A.  Skilled Therapeutic Interventions/Progress Updates: ADL-retraining with emphasis on endurance, functional mobility using standard RW, functional transfers, and dynamic standing balance.   Pt received in her w/c awaiting therapist for planned BADL.  Pt reported moderate pain at right ankle but agreed to bathing as means to reduce or distract herself from discomfort.   Pt requested to toilet first and completed transfer from w/c to toilet unassisted although with supervision for safety.   Pt completed clothing management and hygiene and request assist with w/c mobility in bathroom d/t limited space.   Pt transferred to tub bench similar to toilet and performed bathing seated, using lateral leans to wash buttocks with good thoroughness throughout task.   Pt returned to bedside to dress and required only min assist to pull up her underwear and pants after setup assist to position w/c near sink to facilitate supported standing.   OT educated pt on use of standard walker to replace bariatric walker and advised car transfer training.   Pt completed transfer to/from car after one demonstration using RW to ambulate to car from w/c, approx 15 feet.   No LOB or report of weakness expressed during mobility or transfer.     Pt was then escorted back to her room to rest in w/c near bed with all needs within reach.    Therapy Documentation Precautions:  Precautions Precautions: Fall Precaution Comments: fell 9/28 with nursing (missed EOB when sitting from stedy) Restrictions Weight Bearing Restrictions: No  Pain: Pain Assessment Pain Assessment: 0-10 Pain Score: 8  Pain Type: Acute pain Pain Location: Ankle Pain Orientation: Left Pain Descriptors / Indicators: Aching;Sore Pain Frequency: Constant Pain Onset: On-going Patients Stated Pain Goal: 2 Pain Intervention(s): Medication (See eMAR)  ADL: ADL ADL Comments: Refer to FIM  See FIM for current functional status  Therapy/Group: Individual Therapy  Tarah Buboltz 04/03/2014, 3:08 PM

## 2014-04-04 ENCOUNTER — Encounter (HOSPITAL_COMMUNITY): Payer: BC Managed Care – PPO | Admitting: Occupational Therapy

## 2014-04-04 ENCOUNTER — Inpatient Hospital Stay (HOSPITAL_COMMUNITY): Payer: BC Managed Care – PPO | Admitting: *Deleted

## 2014-04-04 ENCOUNTER — Other Ambulatory Visit (HOSPITAL_COMMUNITY): Payer: Self-pay | Admitting: Orthopedic Surgery

## 2014-04-04 DIAGNOSIS — T148 Other injury of unspecified body region: Secondary | ICD-10-CM

## 2014-04-04 LAB — GLUCOSE, CAPILLARY
Glucose-Capillary: 115 mg/dL — ABNORMAL HIGH (ref 70–99)
Glucose-Capillary: 131 mg/dL — ABNORMAL HIGH (ref 70–99)
Glucose-Capillary: 177 mg/dL — ABNORMAL HIGH (ref 70–99)
Glucose-Capillary: 103 mg/dL — ABNORMAL HIGH (ref 70–99)

## 2014-04-04 NOTE — Progress Notes (Signed)
Occupational Therapy Session Note  Patient Details  Name: [name] MRN: [id number] Date of Birth: 1956/06/09  Today's Date: 04/04/2014 OT Individual Time: 1000-1100 OT Individual Time Calculation (min): 60 min    Short Term Goals: Week 1:  OT Short Term Goal 1 (Week 1): Pt will complete a stand pivot transfer from w/c to toilet with supervision. OT Short Term Goal 2 (Week 1): Pt will be able to walk 15 feet into bathroom with RW with min A. OT Short Term Goal 3 (Week 1): Pt will be able to bathe with supervision. OT Short Term Goal 4 (Week 1): Pt will be able to don her shoes with supervision. OT Short Term Goal 5 (Week 1): Pt will be able to make her bed using RW for support with min A.  Skilled Therapeutic Interventions/Progress Updates:  Pt seated in w/c when arriving, reported pain in R foot and RN aware. Pt self-propelled to apartment and practiced t/f to and from bed w/o and then w/ walker, deciding w/ walker safer and easier for pt. Pt educated on w/c set-up, removing footrests, and raising armrest on side that she is moving towards.Pt practiced t/f into walk-in shower using RW onto TTB 2x, discussing safety w/ walker placement. Pt self-propelled w/c to room and completed UB/LB B&D at sink, w/ 4 sit<>stand t/f. Discussed safe sit<>stand t/f for pulling up pants and practiced 2x. Pt self-propelled w/c to bathroom, completed toilet t/f, voided, and t/f to w/c. Pt self-propelled w/c out of bathroom and seated in w/c w/ all needs nearby when leaving.   Therapy Documentation Precautions:  Precautions Precautions: Fall Precaution Comments: fell 9/28 with nursing (missed EOB when sitting from stedy) Restrictions Weight Bearing Restrictions: No  See FIM for current functional status  Therapy/Group: Individual Therapy  [name] 04/04/2014, 12:20 PM

## 2014-04-04 NOTE — Progress Notes (Signed)
Patient ID: [name], female   DOB: Jun 05, 1956, 58 y.o.   MRN: [id number] CT scan of left ankle reviewed. No signs of chronic osteomyelitis. No sign of chronic tendinitis either. Will plan for removal of the deep tibial screws and irrigation and debridement of the abscess. Possible placement of antibiotic beads. Plan for surgery this week.

## 2014-04-04 NOTE — Progress Notes (Signed)
Physical Therapy Weekly Progress Note  Patient Details  Name: [name] MRN: [id number] Date of Birth: 1955/09/05  Beginning of progress report period: March 27, 2014 End of progress report period: April 04, 2014  Today's Date: 04/04/2014 PT Individual Time:  8:03-9:03 (74mn)  And 13:00-14:00 (674m)   Patient has met 4 of 8 long term goals.  Short term goals not set due to estimated length of stay.  Pt has made good progress with independence of transfers and gait, requiring only Min A at this point. Pt is currently limited by L ankle infection and pain, as well as generalized weakness.   Patient continues to demonstrate the following deficits: weakness, pain, decreased cardiorespiratory endurance, decreased sensation and ROM and therefore will continue to benefit from skilled PT intervention to enhance overall performance with activity tolerance and balance.  See Patient's Care Plan for progression toward long term goals.  Patient progressing toward long term goals..  Plan of care revisions: Gait distance decreased due to L ankle protection. .  Skilled Therapeutic Interventions/Progress Updates:  First tx focused on there-ex for core and hip strengthening, functional mobility training, and gait with RW. Pt resting in bed, reporting ankle sore from manual abscess debridement yesterday by Dr. DoChristena Flake Reviewed pt's goals and progress in therapy at this point.   Therapeutic exercise Instructed pt in supine therex including each of the following 2x10:  - glute sets, abdominal crunches, bridging, hemi- bridging for increased challenge, sidelying clamshells Handout provided for first three tasks to perform in evenings.  Performed 8 min on UE bike for strength and activity tolerance with cues for posture.  Pt engaged in stairs for strengthening x3 with bil rails; ascending and descending forwards.  Therapeutic activity Bed mobility Mod I with increased time and effort required Squat  pivot transfers with S/MIn A and cues for efficiency. Pt relying heavily on UEs today due to ankle pain, posture flexed forward. Pt propelled WC 2x150' Mod I; assist needed for parts due to elevating leg rests.   Second tx focused on therex, functional mobilityt traniing and gait with RW.   Therapeutic exercise Performed 10 min on Nustep with bil UE/LE at level 4, aiming for 60 spm, no rest needed today.  Pt performed 2x10 reps terminal hip ext in standing frame with table removed. Pt had difficulty with technique, and task bothered her knees.  Instructed pt in standing at // bars for hip ABD and marching x10 each with cues for posture. Pt limited by fatigue.  Completed remainder of ex in seated position for conservative care:  - core strengthening with yellow weighted ball in D1 and D2 patterns x10 each as well as russian twist with cues for abdominal contraction.  - Marching and LAQ x10 each   Therapeutic activity Performed squat-pivot transfer training with Min A without device as pt continues to rely heavily on UEs and forward flexed trunk. Instructed pt in multiple stand-step transfers with RW, which she was able to perform with Min A fading to close S with cues for technique.  Functional ambulation with RW x100' with min-guard A for safety and cues for scapular depression.  WC propulsion 2x150' Mod I with assist for parts.   [facility]        Therapy Documentation Precautions:  Precautions Precautions: Fall Precaution Comments: fell 9/28 with nursing (missed EOB when sitting from stedy) Restrictions Weight Bearing Restrictions: No Pain: Pain Assessment Pain Score: 7  -  L ankel, nurse brought pain meds, modified tx and reduced weight bearing tasks  See FIM for current functional status  Therapy/Group: Individual Therapy Kennieth Rad, PT, DPT  04/04/2014, 8:19 AM

## 2014-04-04 NOTE — Progress Notes (Signed)
58 y.o. 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 She was admitted on 03/05/14 with hypotension, tachycardia, fever of 101.9, leucocytosis and significant Left flank swelling. She was started on IV antibiotics for sepsis and was found to have left inguinal hematoma on CT. Patient was noted to have positive troponin's due to NSTEMI and started on IV heparin with serial CBC for monitoring. Right heel culture positive for pseudomonas and BC X 2 positive for MSSA. TEE negative for thrombus or vegetations. Hospital course complicated by respiratory failure requiring intubation on 09/14 to 9/19 and reintubation 09/21 to 9/23. She underwent cardiac cath on 09/18 by Dr. Irish Lack revealing severe 3 vessel CAD with EF 10-15%. She was not felt to be a surgical candidate per Dr. Prescott Gum. CAD treated medically and acute on chronic systolic CHF treated with diuretics with clinical improvement.   Left transudate effusion tapped for 500 cc clear fluid. Speech therapy following for treatment of dysphagia with s/s of aspiration. MBS done on 09/27 revealing severe pharyngeal dysphagia and NPO recommended due to silent aspiration. She underwent PCI of RCA and LAD on 03/22/14 by Dr. Burt Knack with recommendations for ASA and Plavix for lifelong duration. She has completed 3 week course of antibiotics for MSSA bacteremia as well as pseudomonas positive right groin wound. Patient with improvement in hoarseness and FEES done on 10/01 revealing shallow penetration of honey with ability to cough and diet initiated and advanceto Dysphagia 3, honey liquids by tsp  Subjective/Complaints: Left ankle sore, PT working with pt Reviewed ortho note as well as CT scan Review of Systems - Negative except redness Left ankle Objective: Vital Signs: Blood pressure 108/50, pulse 62, temperature 98.2 F  (36.8 C), temperature source Oral, resp. rate 17, height _0  (1.575 m), weight 77.2 kg (170 lb 3.1 oz), SpO2 94.00%. Dg Ankle 2 Views Left  04/03/2014   CLINICAL DATA:  Ankle pain.  EXAM: LEFT ANKLE - 2 VIEW  COMPARISON:  03/05/2014.  FINDINGS: Patient has had prior open reduction and internal fixation of the distal tibia and fibula. Good anatomic alignment. Degenerative change. No acute abnormality. Vascular calcification.  IMPRESSION: 1. Open reduction internal fixation distal tibia and fibula. Good anatomic alignment. 2. Severe degenerative change left ankle. 3. No acute abnormality.   Electronically Signed   By: Marcello Moores  Register   On: 04/03/2014 14:07   Ct Ankle Left Wo Contrast  04/03/2014   CLINICAL DATA:  Purulent drainage from the anterior ankle. Initial encounter.  EXAM: CT OF THE LEFT ANKLE WITHOUT CONTRAST  TECHNIQUE: Multidetector CT imaging of the left ankle was performed according to the standard protocol. Multiplanar CT image reconstructions were also generated.  COMPARISON:  Radiography from earlier the same day.  FINDINGS: Patient is status post ankle fracture with 2 screws traversing the tibial epiphysis. Status post fixation of the distal fibula with compression plate and interfragmentary screw. The fibular construct is unremarkable, but the lower screw in the distal tibia extends into the posterior ankle joint. The ankle is markedly degenerated as evidenced by narrowing and extensive subchondral cystic formation. Patchy sclerosis in the distal tibia may reflect chronic bone infarction. There is no definite acute osteomyelitis as there is no convincing cortical erosion or periosteal reaction. There is no lucency around the screws. No chronic periosteal reaction or involucrum suggestive of chronic osteomyelitis. Subcutaneous fat  infiltration is present along the medial and posterior ankle, correlating with the reported draining wound. There is gas in the posterior ankle joint which may  reflect communication. No gross discontinuity or marked thickening of the medial tendons to suggest associated tenosynovitis.  There is mild degenerative changes to the subtalar joint, with spurring around the posterior facet. Mild talonavicular degeneration with marginal spurring.  Diffuse subcutaneous reticulation and dystrophic calcifications.  IMPRESSION: 1. Small volume gas in the posterior ankle joint could reflect septic arthritis in this patient with draining wound along the medial ankle. No visible osteomyelitis or hardware infection. 2. Remote left ankle fracture status post ORIF. A tibial screw penetrates the posterior ankle joint. 3. 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   Electronically Signed   By: Jorje Guild M.D.   On: 04/03/2014 23:19   Results for orders placed during the hospital encounter of 03/27/14 (from the past 72 hour(s))  GLUCOSE, CAPILLARY     Status: Abnormal   Collection Time    04/01/14 11:37 AM      Result Value Ref Range   Glucose-Capillary 136 (*) 70 - 99 mg/dL   Comment 1 Notify RN    GLUCOSE, CAPILLARY     Status: Abnormal   Collection Time    04/01/14  4:37 PM      Result Value Ref Range   Glucose-Capillary 224 (*) 70 - 99 mg/dL   Comment 1 Notify RN    GLUCOSE, CAPILLARY     Status: Abnormal   Collection Time    04/01/14  9:09 PM      Result Value Ref Range   Glucose-Capillary 132 (*) 70 - 99 mg/dL  GLUCOSE, CAPILLARY     Status: Abnormal   Collection Time    04/02/14  7:43 AM      Result Value Ref Range   Glucose-Capillary 172 (*) 70 - 99 mg/dL   Comment 1 Notify RN    GLUCOSE, CAPILLARY     Status: Abnormal   Collection Time    04/02/14 11:37 AM      Result Value Ref Range   Glucose-Capillary 226 (*) 70 - 99 mg/dL  GLUCOSE, CAPILLARY     Status: Abnormal   Collection Time    04/02/14  4:07 PM      Result Value Ref Range   Glucose-Capillary 183 (*) 70 - 99 mg/dL   Comment 1 Notify RN    GLUCOSE, CAPILLARY      Status: Abnormal   Collection Time    04/02/14  9:20 PM      Result Value Ref Range   Glucose-Capillary 164 (*) 70 - 99 mg/dL  GLUCOSE, CAPILLARY     Status: Abnormal   Collection Time    04/02/14  9:53 PM      Result Value Ref Range   Glucose-Capillary 154 (*) 70 - 99 mg/dL  CREATININE, SERUM     Status: Abnormal   Collection Time    04/03/14  5:31 AM      Result Value Ref Range   Creatinine, Ser 0.49 (*) 0.50 - 1.10 mg/dL   GFR calc non Af Amer >90  >90 mL/min   GFR calc Af Amer >90  >90 mL/min   Comment: (NOTE)     The eGFR has been calculated using the CKD EPI equation.     This calculation has not been validated in all clinical situations.     eGFR's persistently <90 mL/min signify possible Chronic Kidney  Disease.  GLUCOSE, CAPILLARY     Status: Abnormal   Collection Time    04/03/14  7:01 AM      Result Value Ref Range   Glucose-Capillary 166 (*) 70 - 99 mg/dL  GLUCOSE, CAPILLARY     Status: Abnormal   Collection Time    04/03/14 12:16 PM      Result Value Ref Range   Glucose-Capillary 137 (*) 70 - 99 mg/dL  CBC WITH DIFFERENTIAL     Status: Abnormal   Collection Time    04/03/14 12:44 PM      Result Value Ref Range   WBC 5.7  4.0 - 10.5 K/uL   RBC 4.63  3.87 - 5.11 MIL/uL   Hemoglobin 11.5 (*) 12.0 - 15.0 g/dL   HCT 37.0  36.0 - 46.0 %   MCV 79.9  78.0 - 100.0 fL   MCH 24.8 (*) 26.0 - 34.0 pg   MCHC 31.1  30.0 - 36.0 g/dL   RDW 21.6 (*) 11.5 - 15.5 %   Platelets 251  150 - 400 K/uL   Neutrophils Relative % 54  43 - 77 %   Lymphocytes Relative 31  12 - 46 %   Monocytes Relative 12  3 - 12 %   Eosinophils Relative 3  0 - 5 %   Basophils Relative 0  0 - 1 %   Neutro Abs 3.0  1.7 - 7.7 K/uL   Lymphs Abs 1.8  0.7 - 4.0 K/uL   Monocytes Absolute 0.7  0.1 - 1.0 K/uL   Eosinophils Absolute 0.2  0.0 - 0.7 K/uL   Basophils Absolute 0.0  0.0 - 0.1 K/uL   WBC Morphology ATYPICAL LYMPHOCYTES     Smear Review LARGE PLATELETS PRESENT    BASIC METABOLIC PANEL      Status: Abnormal   Collection Time    04/03/14 12:44 PM      Result Value Ref Range   Sodium 136 (*) 137 - 147 mEq/L   Potassium 5.1  3.7 - 5.3 mEq/L   Chloride 94 (*) 96 - 112 mEq/L   CO2 26  19 - 32 mEq/L   Glucose, Bld 155 (*) 70 - 99 mg/dL   BUN 19  6 - 23 mg/dL   Creatinine, Ser 0.81  0.50 - 1.10 mg/dL   Comment: REPEATED TO VERIFY     DELTA CHECK NOTED   Calcium 9.6  8.4 - 10.5 mg/dL   GFR calc non Af Amer 80 (*) >90 mL/min   GFR calc Af Amer >90  >90 mL/min   Comment: (NOTE)     The eGFR has been calculated using the CKD EPI equation.     This calculation has not been validated in all clinical situations.     eGFR's persistently <90 mL/min signify possible Chronic Kidney     Disease.   Anion gap 16 (*) 5 - 15  GLUCOSE, CAPILLARY     Status: Abnormal   Collection Time    04/03/14  4:05 PM      Result Value Ref Range   Glucose-Capillary 120 (*) 70 - 99 mg/dL  GLUCOSE, CAPILLARY     Status: Abnormal   Collection Time    04/03/14  8:10 PM      Result Value Ref Range   Glucose-Capillary 104 (*) 70 - 99 mg/dL  GLUCOSE, CAPILLARY     Status: Abnormal   Collection Time    04/04/14  7:15 AM  Result Value Ref Range   Glucose-Capillary 115 (*) 70 - 99 mg/dL      Head: Normocephalic and atraumatic.  Eyes: Pupils are equal, round, and reactive to light.  Neck: Neck supple.  Dry dressing right neck--site of prior central line. Non tender Cardiovascular: Normal rate. Normal rhythm. No murmurs  Respiratory: Effort normal and breath sounds normal. No respiratory distress. She has no wheezes.  GI: Soft. Bowel sounds are normal.  Musculoskeletal: She exhibits no edema and no tenderness. BLE with evidence of muscle atrophy. 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 Externally rotated, pes planus, Right 2nd toe amp L ant ankle medial joint line erythema, raised area mild tenderness Neurological: She is alert and oriented to person, place, and time.  . Follows  basic commands without difficulty. UE: 4/5 proximal to distal. LE: 2+ HF, 3 KE, 4/5 ankle. Decreased PP/LT below knees bilaterally below knees.  Skin: Dry ulcerated areas bilateral great toes Bilateral shins with chronic vascular changes. Marland Kitchen  Psychiatric: She has a normal mood and affect. Her behavior is normal. Judgment and thought content normal.    Assessment/Plan: 1. Functional deficits secondary to deconditioning after multiple medical issues/sepsis/respiratory failure   which require 3+ hours per day of interdisciplinary therapy in a comprehensive inpatient rehab setting. Physiatrist is providing close team supervision and 24 hour management of active medical problems listed below. Physiatrist and rehab team continue to assess barriers to discharge/monitor patient progress toward functional and medical goals. Cont therapy WBAT left foot/ankle  FIM: FIM - Bathing Bathing Steps Patient Completed: Chest;Right Arm;Left Arm;Abdomen;Front perineal area;Buttocks;Right upper leg;Left upper leg;Right lower leg (including foot);Left lower leg (including foot) Bathing: 5: Set-up assist to: Adjust water temp  FIM - Upper Body Dressing/Undressing Upper body dressing/undressing steps patient completed: Thread/unthread right sleeve of pullover shirt/dresss;Put head through opening of pull over shirt/dress;Thread/unthread left sleeve of pullover shirt/dress;Pull shirt over trunk Upper body dressing/undressing: 5: Set-up assist to: Obtain clothing/put away FIM - Lower Body Dressing/Undressing Lower body dressing/undressing steps patient completed: Thread/unthread right underwear leg;Thread/unthread left underwear leg;Thread/unthread right pants leg;Thread/unthread left pants leg;Don/Doff right sock;Don/Doff left sock Lower body dressing/undressing: 4: Min-Patient completed 75 plus % of tasks  FIM - Toileting Toileting steps completed by patient: Performs perineal hygiene;Adjust clothing prior to  toileting;Adjust clothing after toileting Toileting Assistive Devices: Grab bar or rail for support Toileting: 4: Steadying assist  FIM - Radio producer Devices: Walker;Grab bars;Elevated toilet seat Toilet Transfers: 4-To toilet/BSC: Min A (steadying Pt. > 75%);4-From toilet/BSC: Min A (steadying Pt. > 75%)  FIM - Bed/Chair Transfer Bed/Chair Transfer Assistive Devices: Copy: 4: Bed > Chair or W/C: Min A (steadying Pt. > 75%);4: Chair or W/C > Bed: Min A (steadying Pt. > 75%)  FIM - Locomotion: Wheelchair Distance: 150 Locomotion: Wheelchair: 5: Travels 150 ft or more: maneuvers on rugs and over door sills with supervision, cueing or coaxing FIM - Locomotion: Ambulation Locomotion: Ambulation Assistive Devices: Administrator Ambulation/Gait Assistance: 4: Min guard Locomotion: Ambulation: 2: Travels 50 - 149 ft with minimal assistance (Pt.>75%)  Comprehension Comprehension Mode: Auditory Comprehension: 6-Follows complex conversation/direction: With extra time/assistive device  Expression Expression Mode: Verbal Expression: 6-Expresses complex ideas: With extra time/assistive device  Social Interaction Social Interaction Mode: Asleep Social Interaction: 7-Interacts appropriately with others - No medications needed.  Problem Solving Problem Solving Mode: Asleep Problem Solving: 5-Solves basic problems: With no assist  Memory Memory Mode: Asleep Memory: 6-More than reasonable amt of time  Medical  Problem List and Plan:   1. Functional deficits secondary to deconditioning after multiple medical issues/sepsis/respiratory failure   2. DVT Prophylaxis/Anticoagulation: Pharmaceutical: Lovenox   3. Chronic pain/Pain Management: continue Neurontin tid. Used fentanyl 100 mg as well as oxycodone 30 mg prn at home for chronic bilateral knee as well as neuropathic foot pain.   4. Mood: team to provide ego support. Continue Lexapro.  LCSW to follow for support and evaluation.   5. Neuropsych: This patient is capable of making decisions on her own behalf.   6. Skin/Wound Care: routine pressure relief measures. Monitor left groin daily.   7. Fluids/Electrolytes/Nutrition: Monitor I/O.   8. NSTEMI s/p PCI RCA, CAD and CFX DES: Continue ASA/Plavix. On Imdur, bisoprolol and Lipitor.   9. Acute systolic heart failure: Monitor daily weights and for other signs of overload. Continue Zebeta, lasix 40 mg/day, Cozaar 25 mg/day and aldactone 12. 76m /day. Monitor for tolerance of medications.   10 DM type 2 with neuropathy: Hgb A1c- 7.2. Will monitor BS with ac/hs checks. Continue lantus insulin 20 units daily. SSI for elevated BS.   11. Left ant ankle cyst start doxy, ID rec no abx will d/c  12. ABLA due to left groin hematoma: H/H improving. Will monitor for stability. Recheck in am.   13. Dysphagia: Diet advanced to Dysphagia 3 texturesand should help po intake. Continue honey liquids and push po fluids to maintain adequate hydration.   14. Hypotension: Monitor blood pressures every 8 hours and for signs orthostatic signs.   15. Severe protein malnutrition: Albumin-2.4. Continue ensure supplements bid.       LOS (Days) 8 A FACE TO FACE EVALUATION WAS PERFORMED  Makarios Madlock E 04/04/2014, 8:33 AM

## 2014-04-04 NOTE — Progress Notes (Signed)
Note reviewed and accurately reflects treatment session.   

## 2014-04-05 ENCOUNTER — Inpatient Hospital Stay (HOSPITAL_COMMUNITY): Payer: BC Managed Care – PPO | Admitting: Occupational Therapy

## 2014-04-05 ENCOUNTER — Encounter (HOSPITAL_COMMUNITY)
Admission: RE | Disposition: A | Discharge status: Home Health | Payer: Self-pay | Source: Intra-hospital | Attending: Physical Medicine & Rehabilitation

## 2014-04-05 ENCOUNTER — Encounter (HOSPITAL_COMMUNITY): Payer: Self-pay | Admitting: Certified Registered Nurse Anesthetist

## 2014-04-05 ENCOUNTER — Encounter (HOSPITAL_COMMUNITY): Payer: BC Managed Care – PPO | Admitting: Anesthesiology

## 2014-04-05 ENCOUNTER — Inpatient Hospital Stay (HOSPITAL_COMMUNITY): Payer: BC Managed Care – PPO | Admitting: Anesthesiology

## 2014-04-05 ENCOUNTER — Inpatient Hospital Stay (HOSPITAL_COMMUNITY): Payer: BC Managed Care – PPO | Admitting: *Deleted

## 2014-04-05 ENCOUNTER — Inpatient Hospital Stay (HOSPITAL_COMMUNITY): Payer: BC Managed Care – PPO

## 2014-04-05 ENCOUNTER — Ambulatory Visit (HOSPITAL_COMMUNITY): Payer: BC Managed Care – PPO | Admitting: Speech Pathology

## 2014-04-05 DIAGNOSIS — M86169 Other acute osteomyelitis, unspecified tibia and fibula: Secondary | ICD-10-CM

## 2014-04-05 DIAGNOSIS — E119 Type 2 diabetes mellitus without complications: Secondary | ICD-10-CM

## 2014-04-05 DIAGNOSIS — I255 Ischemic cardiomyopathy: Secondary | ICD-10-CM

## 2014-04-05 HISTORY — PX: I & D EXTREMITY: SHX5045

## 2014-04-05 LAB — GLUCOSE, CAPILLARY
GLUCOSE-CAPILLARY: 130 mg/dL — AB (ref 70–99)
GLUCOSE-CAPILLARY: 148 mg/dL — AB (ref 70–99)
Glucose-Capillary: 185 mg/dL — ABNORMAL HIGH (ref 70–99)
Glucose-Capillary: 134 mg/dL — ABNORMAL HIGH (ref 70–99)
Glucose-Capillary: 196 mg/dL — ABNORMAL HIGH (ref 70–99)

## 2014-04-05 LAB — BASIC METABOLIC PANEL
ANION GAP: 13 (ref 5–15)
BUN: 17 mg/dL (ref 6–23)
CALCIUM: 9.2 mg/dL (ref 8.4–10.5)
CO2: 26 mEq/L (ref 19–32)
CREATININE: 0.6 mg/dL (ref 0.50–1.10)
Chloride: 98 mEq/L (ref 96–112)
GFR calc Af Amer: >90 mL/min (ref >90)
GFR calc non Af Amer: >90 mL/min (ref >90)
Glucose, Bld: 143 mg/dL — ABNORMAL HIGH (ref 70–99)
Potassium: 5 mEq/L (ref 3.7–5.3)
SODIUM: 137 meq/L (ref 137–147)

## 2014-04-05 SURGERY — IRRIGATION AND DEBRIDEMENT EXTREMITY
Anesthesia: General | Laterality: Left

## 2014-04-05 MED ORDER — SODIUM CHLORIDE 0.9 % IR SOLN
Status: DC | PRN
  Administered 2014-04-05: 1000 mL

## 2014-04-05 MED ORDER — METOCLOPRAMIDE HCL 5 MG/ML IJ SOLN
5.0000 mg | Freq: Three times a day (TID) | INTRAMUSCULAR | Status: DC | PRN
  Filled 2014-04-05: qty 2

## 2014-04-05 MED ORDER — CLOPIDOGREL BISULFATE 75 MG PO TABS
ORAL_TABLET | ORAL | Status: DC | PRN
  Administered 2014-04-05: 75 mg via ORAL

## 2014-04-05 MED ORDER — PROPOFOL 10 MG/ML IV BOLUS
INTRAVENOUS | Status: DC | PRN
  Administered 2014-04-05: 50 mg via INTRAVENOUS
  Administered 2014-04-05: 150 mg via INTRAVENOUS

## 2014-04-05 MED ORDER — METHOCARBAMOL 1000 MG/10ML IJ SOLN: 500.0000 mg | Freq: Four times a day (QID) | INTRAVENOUS | Status: DC | PRN

## 2014-04-05 MED ORDER — ASPIRIN 81 MG PO CHEW
CHEWABLE_TABLET | ORAL | Status: AC
  Filled 2014-04-05: qty 1

## 2014-04-05 MED ORDER — METHOCARBAMOL 500 MG PO TABS
500.0000 mg | ORAL_TABLET | Freq: Four times a day (QID) | ORAL | Status: DC | PRN
  Administered 2014-04-05 – 2014-04-11 (×8): 500 mg via ORAL
  Filled 2014-04-05 (×7): qty 1

## 2014-04-05 MED ORDER — OXYCODONE-ACETAMINOPHEN 5-325 MG PO TABS
1.0000 | ORAL_TABLET | ORAL | Status: DC | PRN
  Administered 2014-04-05 – 2014-04-08 (×7): 2 via ORAL
  Filled 2014-04-05 (×7): qty 2

## 2014-04-05 MED ORDER — HYDROMORPHONE HCL 1 MG/ML IJ SOLN: 0.5000 mg | INTRAMUSCULAR | Status: DC | PRN

## 2014-04-05 MED ORDER — OXYCODONE HCL 5 MG PO TABS: 5.0000 mg | ORAL_TABLET | Freq: Once | ORAL | Status: DC | PRN

## 2014-04-05 MED ORDER — FENTANYL CITRATE 0.05 MG/ML IJ SOLN
INTRAMUSCULAR | Status: AC
  Filled 2014-04-05: qty 5

## 2014-04-05 MED ORDER — METOCLOPRAMIDE HCL 5 MG PO TABS
5.0000 mg | ORAL_TABLET | Freq: Three times a day (TID) | ORAL | Status: DC | PRN
  Filled 2014-04-05: qty 2

## 2014-04-05 MED ORDER — SODIUM CHLORIDE 0.9 % IV SOLN
INTRAVENOUS | Status: DC
  Administered 2014-04-05: 18:00:00 via INTRAVENOUS

## 2014-04-05 MED ORDER — ONDANSETRON HCL 4 MG/2ML IJ SOLN: 4.0000 mg | Freq: Four times a day (QID) | INTRAMUSCULAR | Status: DC | PRN

## 2014-04-05 MED ORDER — HYDROMORPHONE HCL 1 MG/ML IJ SOLN
0.2500 mg | INTRAMUSCULAR | Status: DC | PRN
  Administered 2014-04-05 (×4): 0.5 mg via INTRAVENOUS

## 2014-04-05 MED ORDER — GENTAMICIN SULFATE 40 MG/ML IJ SOLN
INTRAMUSCULAR | Status: DC | PRN
  Administered 2014-04-05: 160 mg

## 2014-04-05 MED ORDER — VANCOMYCIN HCL 500 MG IV SOLR
INTRAVENOUS | Status: AC
  Filled 2014-04-05: qty 500

## 2014-04-05 MED ORDER — ASPIRIN 81 MG PO CHEW
CHEWABLE_TABLET | ORAL | Status: DC | PRN
  Administered 2014-04-05: 81 mg via ORAL

## 2014-04-05 MED ORDER — CLOPIDOGREL BISULFATE 75 MG PO TABS
ORAL_TABLET | ORAL | Status: AC
  Filled 2014-04-05: qty 1

## 2014-04-05 MED ORDER — HYDROMORPHONE HCL 1 MG/ML IJ SOLN
INTRAMUSCULAR | Status: AC
  Administered 2014-04-05: 0.5 mg via INTRAVENOUS
  Filled 2014-04-05: qty 1

## 2014-04-05 MED ORDER — EPHEDRINE SULFATE 50 MG/ML IJ SOLN
INTRAMUSCULAR | Status: DC | PRN
  Administered 2014-04-05 (×4): 5 mg via INTRAVENOUS

## 2014-04-05 MED ORDER — DOCUSATE SODIUM 100 MG PO CAPS
100.0000 mg | ORAL_CAPSULE | Freq: Two times a day (BID) | ORAL | Status: DC
  Administered 2014-04-05 – 2014-04-10 (×11): 100 mg via ORAL
  Filled 2014-04-05 (×14): qty 1

## 2014-04-05 MED ORDER — FENTANYL CITRATE 0.05 MG/ML IJ SOLN
INTRAMUSCULAR | Status: DC | PRN
  Administered 2014-04-05: 25 ug via INTRAVENOUS
  Administered 2014-04-05 (×3): 50 ug via INTRAVENOUS
  Administered 2014-04-05: 25 ug via INTRAVENOUS

## 2014-04-05 MED ORDER — VANCOMYCIN HCL 500 MG IV SOLR
INTRAVENOUS | Status: DC | PRN
  Administered 2014-04-05: 500 mg

## 2014-04-05 MED ORDER — LACTATED RINGERS IV SOLN
INTRAVENOUS | Status: DC
  Administered 2014-04-05: 12:00:00 via INTRAVENOUS

## 2014-04-05 MED ORDER — GENTAMICIN SULFATE 40 MG/ML IJ SOLN
INTRAMUSCULAR | Status: AC
Start: 2014-04-05 — End: 2014-04-05
  Filled 2014-04-05: qty 4

## 2014-04-05 MED ORDER — LACTATED RINGERS IV SOLN
INTRAVENOUS | Status: DC | PRN
  Administered 2014-04-05: 13:00:00 via INTRAVENOUS

## 2014-04-05 MED ORDER — PHENYLEPHRINE HCL 10 MG/ML IJ SOLN
INTRAMUSCULAR | Status: DC | PRN
  Administered 2014-04-05 (×2): 40 ug via INTRAVENOUS

## 2014-04-05 MED ORDER — ONDANSETRON HCL 4 MG PO TABS: 4.0000 mg | ORAL_TABLET | Freq: Four times a day (QID) | ORAL | Status: DC | PRN

## 2014-04-05 MED ORDER — LIDOCAINE HCL (CARDIAC) 20 MG/ML IV SOLN
INTRAVENOUS | Status: DC | PRN
  Administered 2014-04-05: 80 mg via INTRAVENOUS

## 2014-04-05 MED ORDER — METHOCARBAMOL 500 MG PO TABS
ORAL_TABLET | ORAL | Status: AC
  Administered 2014-04-05: 500 mg via ORAL
  Filled 2014-04-05: qty 1

## 2014-04-05 MED ORDER — OXYCODONE-ACETAMINOPHEN 5-325 MG PO TABS
ORAL_TABLET | ORAL | Status: AC
  Administered 2014-04-05: 2 via ORAL
  Filled 2014-04-05: qty 2

## 2014-04-05 MED ORDER — OXYCODONE HCL 5 MG/5ML PO SOLN: 5.0000 mg | Freq: Once | ORAL | Status: DC | PRN

## 2014-04-05 SURGICAL SUPPLY — 61 items
BANDAGE ELASTIC 4 VELCRO ST LF (GAUZE/BANDAGES/DRESSINGS) IMPLANT
BANDAGE ELASTIC 6 VELCRO ST LF (GAUZE/BANDAGES/DRESSINGS) IMPLANT
BANDAGE ESMARK 6X9 LF (GAUZE/BANDAGES/DRESSINGS) IMPLANT
BLADE SURG 10 STRL SS (BLADE) IMPLANT
BNDG CMPR 9X6 STRL LF SNTH (GAUZE/BANDAGES/DRESSINGS)
BNDG COHESIVE 4X5 TAN STRL (GAUZE/BANDAGES/DRESSINGS) IMPLANT
BNDG COHESIVE 6X5 TAN STRL LF (GAUZE/BANDAGES/DRESSINGS) ×2 IMPLANT
BNDG ESMARK 6X9 LF (GAUZE/BANDAGES/DRESSINGS)
BNDG GAUZE ELAST 4 BULKY (GAUZE/BANDAGES/DRESSINGS) ×3 IMPLANT
COVER SURGICAL LIGHT HANDLE (MISCELLANEOUS) ×3 IMPLANT
CUFF TOURNIQUET SINGLE 18IN (TOURNIQUET CUFF) IMPLANT
CUFF TOURNIQUET SINGLE 24IN (TOURNIQUET CUFF) IMPLANT
CUFF TOURNIQUET SINGLE 34IN LL (TOURNIQUET CUFF) IMPLANT
CUFF TOURNIQUET SINGLE 44IN (TOURNIQUET CUFF) IMPLANT
DRAPE C-ARM 42X72 X-RAY (DRAPES) IMPLANT
DRAPE INCISE IOBAN 66X45 STRL (DRAPES) IMPLANT
DRAPE ORTHO SPLIT 77X108 STRL (DRAPES)
DRAPE SURG ORHT 6 SPLT 77X108 (DRAPES) IMPLANT
DRAPE U-SHAPE 47X51 STRL (DRAPES) ×3 IMPLANT
DRSG ADAPTIC 3X8 NADH LF (GAUZE/BANDAGES/DRESSINGS) ×3 IMPLANT
DRSG EMULSION OIL 3X3 NADH (GAUZE/BANDAGES/DRESSINGS) ×3 IMPLANT
DRSG PAD ABDOMINAL 8X10 ST (GAUZE/BANDAGES/DRESSINGS) ×3 IMPLANT
DURAPREP 26ML APPLICATOR (WOUND CARE) ×3 IMPLANT
ELECT CAUTERY BLADE 6.4 (BLADE) IMPLANT
ELECT REM PT RETURN 9FT ADLT (ELECTROSURGICAL) ×3
ELECTRODE REM PT RTRN 9FT ADLT (ELECTROSURGICAL) ×1 IMPLANT
GAUZE SPONGE 4X4 12PLY STRL (GAUZE/BANDAGES/DRESSINGS) ×3 IMPLANT
GLOVE BIOGEL PI IND STRL 9 (GLOVE) ×1 IMPLANT
GLOVE BIOGEL PI INDICATOR 9 (GLOVE) ×2
GLOVE SURG ORTHO 9.0 STRL STRW (GLOVE) ×3 IMPLANT
GOWN STRL REUS W/ TWL XL LVL3 (GOWN DISPOSABLE) ×3 IMPLANT
GOWN STRL REUS W/TWL XL LVL3 (GOWN DISPOSABLE) ×9
HANDPIECE INTERPULSE COAX TIP (DISPOSABLE)
KIT BASIN OR (CUSTOM PROCEDURE TRAY) ×3 IMPLANT
KIT ROOM TURNOVER OR (KITS) ×3 IMPLANT
KIT STIMULAN RAPID CURE 5CC (Orthopedic Implant) ×2 IMPLANT
MANIFOLD NEPTUNE II (INSTRUMENTS) ×3 IMPLANT
NS IRRIG 1000ML POUR BTL (IV SOLUTION) ×3 IMPLANT
PACK ORTHO EXTREMITY (CUSTOM PROCEDURE TRAY) ×3 IMPLANT
PAD ARMBOARD 7.5X6 YLW CONV (MISCELLANEOUS) ×6 IMPLANT
PAD CAST 4YDX4 CTTN HI CHSV (CAST SUPPLIES) ×1 IMPLANT
PADDING CAST COTTON 4X4 STRL (CAST SUPPLIES) ×3
PADDING CAST COTTON 6X4 STRL (CAST SUPPLIES) ×3 IMPLANT
SET HNDPC FAN SPRY TIP SCT (DISPOSABLE) IMPLANT
SPONGE GAUZE 4X4 12PLY STER LF (GAUZE/BANDAGES/DRESSINGS) ×4 IMPLANT
SPONGE LAP 18X18 X RAY DECT (DISPOSABLE) ×3 IMPLANT
STAPLER VISISTAT 35W (STAPLE) IMPLANT
STOCKINETTE IMPERVIOUS 9X36 MD (GAUZE/BANDAGES/DRESSINGS) IMPLANT
SUT ETHILON 2 0 PSLX (SUTURE) IMPLANT
SUT VIC AB 0 CT1 27 (SUTURE)
SUT VIC AB 0 CT1 27XBRD ANBCTR (SUTURE) IMPLANT
SUT VIC AB 2-0 CT1 27 (SUTURE)
SUT VIC AB 2-0 CT1 TAPERPNT 27 (SUTURE) IMPLANT
TOWEL OR 17X24 6PK STRL BLUE (TOWEL DISPOSABLE) ×3 IMPLANT
TOWEL OR 17X26 10 PK STRL BLUE (TOWEL DISPOSABLE) ×3 IMPLANT
TUBE ANAEROBIC SPECIMEN COL (MISCELLANEOUS) IMPLANT
TUBE CONNECTING 12'X1/4 (SUCTIONS) ×1
TUBE CONNECTING 12X1/4 (SUCTIONS) ×2 IMPLANT
UNDERPAD 30X30 INCONTINENT (UNDERPADS AND DIAPERS) ×3 IMPLANT
WATER STERILE IRR 1000ML POUR (IV SOLUTION) ×3 IMPLANT
YANKAUER SUCT BULB TIP NO VENT (SUCTIONS) ×3 IMPLANT

## 2014-04-05 NOTE — Progress Notes (Signed)
Occupational Therapy Weekly Progress Note  Patient Details  Name: [name] MRN: [id number] Date of Birth: [birth year]  Beginning of progress report period: March 28, 2014 End of progress report period: April 05, 2014  Today's Date: 04/05/2014 OT Individual Time: 7544-9201 OT Individual Time Calculation (min): 45 min    Patient has met 5 of 5 short term goals.  Ms. Ryant is making steady progress with OT sessions and has reached an overall min assist to supervision level for most selfcare tasks.  She still needs min guard for sit to stand as her left foot can slide out in front of her when wearing just her gripper socks.  Feel she is on target for modified independent to supervision level goals, however a lot will depend on her ankle surgery which is happening today.  Unsure if MD will keep her at [facility] or [place].  Will await MD orders.    Patient continues to demonstrate the following deficits: decreased balance, decreased endurance, decreased strength, and therefore will continue to benefit from skilled OT intervention to enhance overall performance with BADL.  Patient progressing toward long term goals..  Continue plan of care.  OT Short Term Goals Week 2:  OT Short Term Goal 1 (Week 2): Pt will perform all dressing sit to stand with modified independence. OT Short Term Goal 2 (Week 2): Pt will peform toilet transfer with modified indepedence using the RW. OT Short Term Goal 3 (Week 2): Pt will perform tub/shower transfers with supervision using the RW. OT Short Term Goal 4 (Week 2): Pt will perform simple home management tasks with supervision using the RW for support.  Skilled Therapeutic Interventions/Progress Updates:   Pt performed transfers stand pivot with close supervision to min guard assist during session.  Had her transfer to her wheelchair and then roll herself down to the therapy gym.  Worked on sit to stand and standing balance from EOM.  Pt attempted to use  bilateral UEs in standing to put together a 3 dimensional puzzle.  Pt able to stand with min guard assist for periods of 2-4 mins before needing rest break.  Pt able to perform 3-4 intervals of standing during treatment.  Returned to room at end of session with pt requesting to sit up in the wheelchair.  Noted she was having ankle surgery on her left ankle this am as well.    Therapy Documentation Precautions:  Precautions Precautions: Fall Precaution Comments: fell 9/28 with nursing (missed EOB when sitting from stedy) Restrictions Weight Bearing Restrictions: No  Pain: Pain Assessment Pain Assessment: 0-10 Pain Score: 8  Pain Type: Acute pain Pain Location: Ankle Pain Orientation: Left Pain Descriptors / Indicators: Sore Pain Intervention(s): Medication (See eMAR) ADL:  See FIM for current functional status  Therapy/Group: Individual Therapy  Devante Capano OTR/L 04/05/2014, 12:43 PM

## 2014-04-05 NOTE — Progress Notes (Signed)
SLP Cancellation Note  Patient Details Name: Leodis RainsCheryl C Zheng MRN: [id number] DOB: [birth year]   Cancelled treatment:        SLP attempted to see pt for ST targeting dysphagia goals; however, per MD, pt is NPO for scheduled surgery this AM.  Pt is independently completing pharyngeal strengthening exercises between therapy sessions.  Therefore, pt missed 60 minutes of skilled ST.  SLP Will follow up to assess for diet tolerance at next available appointment.                                                                                                Corianna Avallone, Melanee SpryNicole L 04/05/2014, 8:14 AM

## 2014-04-05 NOTE — H&P (View-Only) (Signed)
Reason for Consult: Pain and swelling and drainage anterior medial aspect left ankle Referring Physician: Dr. Marney Doctor is an 58 y.o. female.  HPI: Patient is a 58 year old woman who is status post a remote open reduction and total fixation of her left ankle. Patient is been having increasing pain with weightbearing on the left ankle. She has had redness swelling and is seen today with drainage from the anterior medial aspect of her ankle.  Past Medical History  Diagnosis Date  . Diabetes mellitus   . Peripheral vascular disease   . Hyperlipidemia   . Obesity   . Hypertension   . Arthritis   . Ulcer of ankle     right  . Coronary artery disease   . COPD (chronic obstructive pulmonary disease)   . NSTEMI (non-ST elevated myocardial infarction)     Past Surgical History  Procedure Laterality Date  . Cholecystectomy    . Femoral-tibial bypass graft Right 09/27/2013    Procedure: BYPASS GRAFT FEMORAL-TIBIAL ARTERY-RIGHT;  Surgeon: Serafina Mitchell, MD;  Location: [facility];  Service: Vascular;  Laterality: Right;  . Abdominal aorta stent Right   . Bypass graft popliteal to tibial Right   . Coronary angioplasty with stent placement      proximcal RCA stent ~ 2001  . Amputation Right 10/14/2013    Procedure: AMPUTATION RAY;  Surgeon: Newt Minion, MD;  Location: [place];  Service: Orthopedics;  Laterality: Right;  Right 2nd Ray Amputation, Theraskin Graft Right Ankle  . Ankle surgery Left     pinnining for fracture    Family History  Problem Relation Age of Onset  . Cancer Mother   . Heart disease Father   . Hyperlipidemia Father   . Hypertension Father   . Other Father     varicose veins  . Heart attack Father   . Heart disease Sister   . Hyperlipidemia Sister   . Hypertension Sister   . Other Sister     varicose veins  . Heart attack Sister   . Heart disease Brother   . Hypertension Brother   . Hyperlipidemia Brother   . Other Brother     varicose veins  .  Heart attack Brother     Social History:  reports that she quit smoking about 3 weeks ago. Her smoking use included Cigarettes. She has a 60 pack-year smoking history. She has never used smokeless tobacco. She reports that she does not drink alcohol or use illicit drugs.  Allergies:  Allergies  Allergen Reactions  . Penicillins Hives    Tolerates cefepime  . Adhesive [Tape] Rash    Medications: I have reviewed the patient's current medications.  Results for orders placed during the hospital encounter of 03/27/14 (from the past 48 hour(s))  GLUCOSE, CAPILLARY     Status: Abnormal   Collection Time    04/01/14  9:09 PM      Result Value Ref Range   Glucose-Capillary 132 (*) 70 - 99 mg/dL  GLUCOSE, CAPILLARY     Status: Abnormal   Collection Time    04/02/14  7:43 AM      Result Value Ref Range   Glucose-Capillary 172 (*) 70 - 99 mg/dL   Comment 1 Notify RN    GLUCOSE, CAPILLARY     Status: Abnormal   Collection Time    04/02/14 11:37 AM      Result Value Ref Range   Glucose-Capillary 226 (*) 70 - 99 mg/dL  GLUCOSE, CAPILLARY     Status: Abnormal   Collection Time    04/02/14  4:07 PM      Result Value Ref Range   Glucose-Capillary 183 (*) 70 - 99 mg/dL   Comment 1 Notify RN    GLUCOSE, CAPILLARY     Status: Abnormal   Collection Time    04/02/14  9:20 PM      Result Value Ref Range   Glucose-Capillary 164 (*) 70 - 99 mg/dL  GLUCOSE, CAPILLARY     Status: Abnormal   Collection Time    04/02/14  9:53 PM      Result Value Ref Range   Glucose-Capillary 154 (*) 70 - 99 mg/dL  CREATININE, SERUM     Status: Abnormal   Collection Time    04/03/14  5:31 AM      Result Value Ref Range   Creatinine, Ser 0.49 (*) 0.50 - 1.10 mg/dL   GFR calc non Af Amer >90  >90 mL/min   GFR calc Af Amer >90  >90 mL/min   Comment: (NOTE)     The eGFR has been calculated using the CKD EPI equation.     This calculation has not been validated in all clinical situations.     eGFR's  persistently <90 mL/min signify possible Chronic Kidney     Disease.  GLUCOSE, CAPILLARY     Status: Abnormal   Collection Time    04/03/14  7:01 AM      Result Value Ref Range   Glucose-Capillary 166 (*) 70 - 99 mg/dL  GLUCOSE, CAPILLARY     Status: Abnormal   Collection Time    04/03/14 12:16 PM      Result Value Ref Range   Glucose-Capillary 137 (*) 70 - 99 mg/dL  CBC WITH DIFFERENTIAL     Status: Abnormal   Collection Time    04/03/14 12:44 PM      Result Value Ref Range   WBC 5.7  4.0 - 10.5 K/uL   RBC 4.63  3.87 - 5.11 MIL/uL   Hemoglobin 11.5 (*) 12.0 - 15.0 g/dL   HCT 37.0  36.0 - 46.0 %   MCV 79.9  78.0 - 100.0 fL   MCH 24.8 (*) 26.0 - 34.0 pg   MCHC 31.1  30.0 - 36.0 g/dL   RDW 21.6 (*) 11.5 - 15.5 %   Platelets 251  150 - 400 K/uL   Neutrophils Relative % 54  43 - 77 %   Lymphocytes Relative 31  12 - 46 %   Monocytes Relative 12  3 - 12 %   Eosinophils Relative 3  0 - 5 %   Basophils Relative 0  0 - 1 %   Neutro Abs 3.0  1.7 - 7.7 K/uL   Lymphs Abs 1.8  0.7 - 4.0 K/uL   Monocytes Absolute 0.7  0.1 - 1.0 K/uL   Eosinophils Absolute 0.2  0.0 - 0.7 K/uL   Basophils Absolute 0.0  0.0 - 0.1 K/uL   WBC Morphology ATYPICAL LYMPHOCYTES     Smear Review LARGE PLATELETS PRESENT    BASIC METABOLIC PANEL     Status: Abnormal   Collection Time    04/03/14 12:44 PM      Result Value Ref Range   Sodium 136 (*) 137 - 147 mEq/L   Potassium 5.1  3.7 - 5.3 mEq/L   Chloride 94 (*) 96 - 112 mEq/L   CO2 26  19 - 32 mEq/L  Glucose, Bld 155 (*) 70 - 99 mg/dL   BUN 19  6 - 23 mg/dL   Creatinine, Ser 0.81  0.50 - 1.10 mg/dL   Comment: REPEATED TO VERIFY     DELTA CHECK NOTED   Calcium 9.6  8.4 - 10.5 mg/dL   GFR calc non Af Amer 80 (*) >90 mL/min   GFR calc Af Amer >90  >90 mL/min   Comment: (NOTE)     The eGFR has been calculated using the CKD EPI equation.     This calculation has not been validated in all clinical situations.     eGFR's persistently <90 mL/min signify  possible Chronic Kidney     Disease.   Anion gap 16 (*) 5 - 15  GLUCOSE, CAPILLARY     Status: Abnormal   Collection Time    04/03/14  4:05 PM      Result Value Ref Range   Glucose-Capillary 120 (*) 70 - 99 mg/dL    Dg Ankle 2 Views Left  04/03/2014   CLINICAL DATA:  Ankle pain.  EXAM: LEFT ANKLE - 2 VIEW  COMPARISON:  03/05/2014.  FINDINGS: Patient has had prior open reduction and internal fixation of the distal tibia and fibula. Good anatomic alignment. Degenerative change. No acute abnormality. Vascular calcification.  IMPRESSION: 1. Open reduction internal fixation distal tibia and fibula. Good anatomic alignment. 2. Severe degenerative change left ankle. 3. No acute abnormality.   Electronically Signed   By: Marcello Moores  Register   On: 04/03/2014 14:07    Review of Systems  All other systems reviewed and are negative.  Blood pressure 100/58, pulse 72, temperature 97.5 F (36.4 C), temperature source Oral, resp. rate 16, height 5' 2" (1.575 m), weight 77.5 kg (170 lb 13.7 oz), SpO2 99.00%. Physical Exam On examination patient has purulence draining from the anterior medial aspect of her ankle. This is directly over the area of previous screws for internal fixation. The screws appear to penetrate into the joint and into the talus. Radiographs are suggestive of some lytic lesions around the screws. The purulent drainage was decompressed at bedside. Approximately 5 cc was drained. 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  Plan: Secondary to the hardware I do not feel that an MRI scan would provide [place] any information. I will obtain a CT scan to further evaluate the ankle joint. Anticipate patient will need surgery this week.Burnis Medin start her on IV antibiotics at this time.  DUDA,MARCUS  V 04/03/2014, 7:04 PM

## 2014-04-05 NOTE — Anesthesia Procedure Notes (Signed)
Procedure Name: LMA Insertion Date/Time: 04/05/2014 1:39 PM Performed by: Minus LibertyAVENEL, Kaitlin Soliday Pre-anesthesia Checklist: Patient identified, Emergency Drugs available, Suction available, Patient being monitored and Timeout performed Patient Re-evaluated:Patient Re-evaluated prior to inductionOxygen Delivery Method: Circle system utilized Preoxygenation: Pre-oxygenation with 100% oxygen Intubation Type: IV induction Ventilation: Mask ventilation without difficulty LMA: LMA inserted LMA Size: 4.0 Tube type: Oral Number of attempts: 1 Dental Injury: Teeth and Oropharynx as per pre-operative assessment

## 2014-04-05 NOTE — Progress Notes (Signed)
NUTRITION FOLLOW-UP  Pt meets criteria for SEVERE MALNUTRITION in the context of chronic illness as evidenced by 21% weight loss x 4 months and intake of </= 75% of her estimated needs.  DOCUMENTATION CODES Per approved criteria  -Severe malnutrition in the context of chronic illness   INTERVENTION: Pt currently NPO for procedure. Once pt can eat again continue: 1 scoop beneprotein TID.  Ensure Pudding po once daily, each supplement provides 170 kcal and 4 grams of protein  Glucerna Shake po once daily thickened to nectar thick consistency, each supplement provides 220 kcal and 10 grams of protein  NUTRITION DIAGNOSIS: Increased nutrient needs related to wound as evidenced by estimated needs; ongoing  Goal: Pt to meet >/= 90% of their estimated nutrition needs; met  Monitor:  PO intake, weight trends, labs  58 y.o. female  Admitting Dx: Deconditioning   ASSESSMENT: Pt admitted to rehab after hospitalization for sepsis and NSTEMI. Pt with hx of PVD s/p recent right lower extremity runoff.   10/6- Per pt she has lost a lot of weight in the last 6 months. She reports not being able to eat at night due to nausea for the last 6 months. Breakfast: oatmeal, Lunch: sandwich, Dinner: usually nothing. This is a change from her normal meal patterns. She has lost 21% of her body weight in 4 months. Per pt her PCP was concerned about her weight loss and she was scheduled to see GI. Per pt she has been able to tolerate all three meals. PO has been recorded as 100%.   10/8-Pt is now on a dysphagia 3 diet with honey thick liquids. Meal completion is 100%. Pt reports she is taking the beneprotein as well as the Ensure pudding, however reports she does not favor it much. She reports she would like to keep the order as she likes to take them with her medications. Pt reports she would like to try Glucerna. Will order.   10/14- Pt is currently NPO for procedure today.  PROCEDURE: Irrigation and  Debridement left Ankle, Remove Deep Hardware, and Place Antibiotic Beads  Local tissue rearrangment 6 X 3 cm  Pt reports having a good appetite. Meal completions is 100%. Pt has been using the Beneprotein and has been eating her Ensure Pudding. Pt reports she has not tried her Glucerna, but will once she is able to again.   Height: Ht Readings from Last 1 Encounters:  03/29/14 $RemoveB'5\' 2"'KqDOQODM$  (1.575 m)    Weight: Wt Readings from Last 1 Encounters:  04/05/14 175 lb 0.7 oz (79.4 kg)    BMI:  Body mass index is 32.01 kg/(m^2).  Re-Estimated Nutritional Needs: Kcal: 1800-2000 Protein: 90-110 grams Fluid: 1.8 - 2 L/day  Skin:  Excoriation on groin Diabetic left toe ulcer Diabetic right toe ulcer Diabetic right ankle ulcer Non-pitting LLE edema  Diet Order: NPO   Intake/Output Summary (Last 24 hours) at 04/05/14 1551 Last data filed at 04/05/14 1358  Gross per 24 hour  Intake    740 ml  Output      0 ml  Net    740 ml    Last BM: 10/12  Labs:   Recent Labs Lab 04/03/14 0531 04/03/14 1244 04/05/14 0626  NA  --  136* 137  K  --  5.1 5.0  CL  --  94* 98  CO2  --  26 26  BUN  --  19 17  CREATININE 0.49* 0.81 0.60  CALCIUM  --  9.6 9.2  GLUCOSE  --  155* 143*    CBG (last 3)   Recent Labs  04/05/14 0659 04/05/14 1143 04/05/14 1409  GLUCAP 130* 185* 148*    Scheduled Meds: . aspirin      . [facility] HOLD] aspirin  81 mg Oral Daily  . [facility] HOLD] atorvastatin  80 mg Oral q1800  . [MAR HOLD] bisoprolol  5 mg Oral Daily  . clopidogrel      . [facility] HOLD] clopidogrel  75 mg Oral Daily  . [MAR HOLD] enoxaparin (LOVENOX) injection  30 mg Subcutaneous Q24H  . [MAR HOLD] escitalopram  10 mg Oral Daily  . [MAR HOLD] feeding supplement (ENSURE)  1 Container Oral Q1400  . [MAR HOLD] feeding supplement (GLUCERNA SHAKE)  237 mL Oral Q1500  . [facility] HOLD] furosemide  40 mg Oral Daily  . [facility] HOLD] gabapentin  300 mg Oral TID  . [MAR HOLD] glimepiride  2 mg Oral Q breakfast  .  [MAR HOLD] insulin aspart  0-5 Units Subcutaneous QHS  . [MAR HOLD] insulin aspart  0-9 Units Subcutaneous TID WC  . [MAR HOLD] insulin aspart  4 Units Subcutaneous TID WC  . [MAR HOLD] losartan  25 mg Oral QHS  . [MAR HOLD] metFORMIN  1,000 mg Oral BID WC  . [facility] HOLD] MUSCLE RUB   Topical TID AC  . [facility] HOLD] nitroGLYCERIN  0.4 mg Transdermal Q24H  . [MAR HOLD] nystatin   Topical TID  . [facility] HOLD] nystatin cream   Topical BID  . [MAR HOLD] protein supplement  1 scoop Oral TID WC  . [facility] HOLD] spironolactone  12.5 mg Oral Daily  . [facility] HOLD] vancomycin  1,000 mg Intravenous Q12H    Continuous Infusions: . lactated ringers 10 mL/hr at 04/05/14 1226    Past Medical History  Diagnosis Date  . Diabetes mellitus   . Peripheral vascular disease   . Hyperlipidemia   . Obesity   . Hypertension   . Arthritis   . Ulcer of ankle     right  . Coronary artery disease   . COPD (chronic obstructive pulmonary disease)   . NSTEMI (non-ST elevated myocardial infarction)     Past Surgical History  Procedure Laterality Date  . Cholecystectomy    . Femoral-tibial bypass graft Right 09/27/2013    Procedure: BYPASS GRAFT FEMORAL-TIBIAL ARTERY-RIGHT;  Surgeon: Serafina Mitchell, MD;  Location: [facility] OR;  Service: Vascular;  Laterality: Right;  . Abdominal aorta stent Right   . Bypass graft popliteal to tibial Right   . Coronary angioplasty with stent placement      proximcal RCA stent ~ 2001  . Amputation Right 10/14/2013    Procedure: AMPUTATION RAY;  Surgeon: Newt Minion, MD;  Location: [place];  Service: Orthopedics;  Laterality: Right;  Right 2nd Ray Amputation, Theraskin Graft Right Ankle  . Ankle surgery Left     pinnining for fracture    Kallie Locks, MS, RD, LDN Pager # [phone] After hours/ weekend pager # [phone]

## 2014-04-05 NOTE — Progress Notes (Signed)
Social Work Patient ID: [name], female   DOB: [birth year], 58 y.o.   MRN: [id number] Approached by MD regarding insurance issues about pt's surgery and whether she needs to stay on acute or if can return to rehab once surgery completed today. Message left for insurance CM-Anne and await her return call.

## 2014-04-05 NOTE — Progress Notes (Addendum)
Physical Therapy session Note  Patient Details  Name: Kaitlin RainsCheryl C [name] MRN: [id number] Date of Birth: 07-02-1955  Today's Date: 04/05/2014 PT Individual Time: 1115-1138 PT Individual Time Calculation (min): 23 min   PT Short Term Goals Week 1:  PT Short Term Goal 1 (Week 1): STG=LTG due to LOS, S overall PT Short Term Goal 1 - Progress (Week 1): Progressing toward goal Week 2: STG= LTG  Skilled Therapeutic Interventions/Progress Updates:  Pt awaiting L ankle surgery.  Gait not a focus of tx today due to pt's pain and imminent surgery.  W/c> bed stand pivot transfer to R with RW, close supervision.  Therapeutic exercise performed with trunk and bil LEs to increase strength for functional mobility: 2 x 10 modified abdominal crunches; 1 x 10 R and L straight leg raises, R and L hip abduction and L hip ext with flexed knee;  with assistance in sidelying .   tx short due to Nsg care for pre-op bathing procedure.  Tx 2: missed due to surgery    Therapy Documentation Precautions:  Precautions Precautions: Fall Precaution Comments: fell 9/28 with nursing (missed EOB when sitting from stedy) Restrictions Weight Bearing Restrictions: No General: PT Amount of Missed Time (min): 22 Minutes; 30 min PT Missed Treatment Reason: Nursing care (pre-operative bath); off floor for L ankle surgery   Pain: Pain Assessment Pain Score: 8  Pain Location: Ankle Pain Orientation: Left Pain Descriptors / Indicators: Sore Pain Intervention(s): Medication (See eMAR)    See FIM for current functional status  Therapy/Group: Individual Therapy  Daisie Haft 04/05/2014, 11:47 AM

## 2014-04-05 NOTE — Anesthesia Preprocedure Evaluation (Addendum)
Anesthesia Evaluation  Patient identified by MRN, date of birth, ID band Patient awake    Reviewed: Allergy & Precautions, H&P , NPO status , Patient's Chart, lab work & pertinent test results  History of Anesthesia Complications Negative for: history of anesthetic complications  Airway Mallampati: II TM Distance: >3 FB Neck ROM: full    Dental  (+) Dental Advisory Given, Chipped,    Pulmonary Current Smoker, former smoker,  breath sounds clear to auscultation        Cardiovascular hypertension, Pt. on medications and Pt. on home beta blockers + CAD, + Past MI, + Cardiac Stents and + Peripheral Vascular Disease Rhythm:Regular     Neuro/Psych    GI/Hepatic negative GI ROS, Neg liver ROS,   Endo/Other  diabetes, Type 2Morbid obesity  Renal/GU negative Renal ROS     Musculoskeletal  (+) Arthritis -,   Abdominal   Peds  Hematology  (+) anemia ,   Anesthesia Other Findings MI with DES in 02/2014, remains on ASA and Plavix without angina, EF 30%  Reproductive/Obstetrics                         Anesthesia Physical  Anesthesia Plan  ASA: III  Anesthesia Plan: General   Post-op Pain Management:    Induction: Intravenous  Airway Management Planned: LMA and Oral ETT  Additional Equipment: None  Intra-op Plan:   Post-operative Plan: Extubation in OR  Informed Consent: I have reviewed the patients History and Physical, chart, labs and discussed the procedure including the risks, benefits and alternatives for the proposed anesthesia with the patient or authorized representative who has indicated his/her understanding and acceptance.   Dental advisory given  Plan Discussed with: CRNA, Anesthesiologist and Surgeon  Anesthesia Plan Comments:       Anesthesia Quick Evaluation

## 2014-04-05 NOTE — Interval H&P Note (Signed)
History and Physical Interval Note:  04/05/2014 6:46 AM  [name]  has presented today for surgery, with the diagnosis of Abscess Left Ankle  The various methods of treatment have been discussed with the patient and family. After consideration of risks, benefits and other options for treatment, the patient has consented to  Procedure(s): Irrigation and Debridement left Ankle, Remove Deep Hardware, and Place Antibiotic Beads (Left) Irrigation and Debridement left Ankle, Remove Deep Hardware, and Place Antibiotic Beads (Left) as a surgical intervention .  The patient's history has been reviewed, patient examined, no change in status, stable for surgery.  I have reviewed the patient's chart and labs.  Questions were answered to the patient's satisfaction.     Sherrise Liberto V

## 2014-04-05 NOTE — Progress Notes (Signed)
Speech Language Pathology Weekly Progress Note  Patient Details  Name: [name] MRN: [id number] Date of Birth: [birth year]  Beginning of progress report period: March 28, 2014 End of progress report period: April 05, 2014    Short Term Goals: Week 1: SLP Short Term Goal 1 (Week 1): Pt will consume presentations of her currently prescribed diet with no overt s/s of aspiration and supervision cues for use of swallowing precautions.  SLP Short Term Goal 1 - Progress (Week 1): Met SLP Short Term Goal 2 (Week 1): Pt will return demonstration of pharyngeal strengthening exercises with supervision  SLP Short Term Goal 2 - Progress (Week 1): Met SLP Short Term Goal 3 (Week 1): Pt will tolerate trials of advanced consistencies with no overt s/s of aspiration  SLP Short Term Goal 3 - Progress (Week 1): Met SLP Short Term Goal 4 (Week 1): Pt will participate in a repeat objective swallowing study to determine readiness for diet advancement.   SLP Short Term Goal 4 - Progress (Week 1): Met SLP Short Term Goal 5 (Week 1): Pt will consume water and ice chips while utilizing precautions from the water protocol with supervision. SLP Short Term Goal 5 - Progress (Week 1): Met    New Short Term Goals: Week 2: SLP Short Term Goal 1 (Week 2): Pt will consume presentations of her currently prescribed diet with no overt s/s of aspiration and mod I use of swallowing precautions.  SLP Short Term Goal 2 (Week 2): Pt will tolerate trials of thin liquids with meals with no overt s/s of aspiration  SLP Short Term Goal 3 (Week 2): Pt will consume water and ice chips while utilizing precautions from the water protocol with mod I.  Weekly Progress Updates:  Pt made functional gains this reporting period and has met 5 out of 5 short term goals due to overall improved diet tolerance of advanced consistencies and use of pharyngeal strengthening exercises.  Currently, pt requires overall supervision level assist  for use of swallowing precautions during meals to minimize overt s/s of aspiration with dys 3 solids and nectar thick liquids as well as with trials of regular water and ice chips following oral care as part of the water protocol.  Pt would continue to benefit from skilled ST while inpatient in order to maximize swallowing function and to target trials of advanced consistencies.  Pt education is ongoing.    Intensity: Minumum of 1-2 x/day, 30 to 90 minutes Frequency: 3 out of 7 days Duration/Length of Stay: 7-10 days  Treatment/Interventions: Patient/family education;Dysphagia/aspiration precaution training;Cueing hierarchy   Lula Michaux, Selinda Orion 04/05/2014, 8:36 AM

## 2014-04-05 NOTE — Transfer of Care (Signed)
Immediate Anesthesia Transfer of Care Note  Patient: [name]  Procedure(s) Performed: Procedure(s): Irrigation and Debridement left Ankle, Remove Deep Hardware, and Place Antibiotic Beads (Left)  Patient Location: PACU  Anesthesia Type:General  Level of Consciousness: awake, alert  and oriented  Airway & Oxygen Therapy: Patient Spontanous Breathing and Patient connected to nasal cannula oxygen  Post-op Assessment: Report given to PACU RN and Post -op Vital signs reviewed and stable  Post vital signs: Reviewed and stable  Complications: No apparent anesthesia complications

## 2014-04-05 NOTE — Op Note (Signed)
03/27/2014 - 04/05/2014  1:57 PM  PATIENT:  [name]    PRE-OPERATIVE DIAGNOSIS:  Abscess Left Ankle  POST-OPERATIVE DIAGNOSIS:  Same  PROCEDURE:  Irrigation and Debridement left Ankle, Remove Deep Hardware, and Place Antibiotic Beads Local tissue rearrangment 6 X 3 cm  SURGEON:  Saroya Riccobono V, MD  PHYSICIAN ASSISTANT:None ANESTHESIA:   General  PREOPERATIVE INDICATIONS:  [name] is a  58 y.o. female with a diagnosis of Abscess Left Ankle who failed conservative measures and elected for surgical management.    The risks benefits and alternatives were discussed with the patient preoperatively including but not limited to the risks of infection, bleeding, nerve injury, cardiopulmonary complications, the need for revision surgery, among others, and the patient was willing to proceed.  OPERATIVE IMPLANTS: none  OPERATIVE FINDINGS: Infection at the site of the 2 tibial screws  OPERATIVE PROCEDURE: Patient is a 58 year old woman who is status post previous internal fixation of her left ankle. Patient is seen at this time with acute abscess over the 2 tibial screws. She presents for removal of deep retained hardware placement of antibiotic beads tissue rearrangement and wound closure. Risks and benefits were discussed including persistent infection neurovascular injury nonhealing of the skin need for additional surgery. Patient states she understands and wished to proceed at this time.  Patient was brought to the operating room and underwent a general anesthetic. After adequate levels of anesthesia were obtained patient's left lower extremity was prepped using DuraPrep draped into a sterile field. A dorsal incision was made longitudinally over the ankle joint directly over the ulcer. The ulcer was 10 mm in diameter. This was ellipsed out. The ulcer directly communicated to her previous hardware. The deep retained hardware was removed. The bone was debrided the ankle joint was  debrided. The wound was irrigated with pulsatile lavage. Antibiotic beads were made with stimulant 5 cc with 500 mg vancomycin and 160 mg gentamicin. The antibiotic beads were placed in the ankle and over the wound. Local tissue rearrangement was performed the wound was closed using 2-0 nylon. A sterile compressive dressing was applied. Patient was extubated taken to the PACU in stable condition.

## 2014-04-05 NOTE — Progress Notes (Signed)
Orthopedic Tech Progress Note Patient Details:  Leodis RainsCheryl C Else February 11, 1956 [id number]  Ortho Devices Type of Ortho Device: Postop shoe/boot Ortho Device/Splint Location: lle Ortho Device/Splint Interventions: Application   Nikki Domrawford, Tetsuo Coppola 04/05/2014, 6:41 PM

## 2014-04-06 ENCOUNTER — Inpatient Hospital Stay (HOSPITAL_COMMUNITY): Payer: BC Managed Care – PPO | Admitting: Occupational Therapy

## 2014-04-06 ENCOUNTER — Ambulatory Visit (HOSPITAL_COMMUNITY): Payer: BC Managed Care – PPO | Admitting: Speech Pathology

## 2014-04-06 ENCOUNTER — Inpatient Hospital Stay (HOSPITAL_COMMUNITY): Payer: BC Managed Care – PPO

## 2014-04-06 LAB — GLUCOSE, CAPILLARY
GLUCOSE-CAPILLARY: 165 mg/dL — AB (ref 70–99)
GLUCOSE-CAPILLARY: 178 mg/dL — AB (ref 70–99)
Glucose-Capillary: 122 mg/dL — ABNORMAL HIGH (ref 70–99)
Glucose-Capillary: 105 mg/dL — ABNORMAL HIGH (ref 70–99)

## 2014-04-06 LAB — VANCOMYCIN, TROUGH: Vancomycin Tr: 21.2 ug/mL — ABNORMAL HIGH (ref 10.0–20.0)

## 2014-04-06 MED ORDER — VANCOMYCIN HCL IN DEXTROSE 750-5 MG/150ML-% IV SOLN
750.0000 mg | Freq: Two times a day (BID) | INTRAVENOUS | Status: DC
  Administered 2014-04-06 – 2014-04-09 (×6): 750 mg via INTRAVENOUS
  Filled 2014-04-06 (×7): qty 150

## 2014-04-06 NOTE — Anesthesia Postprocedure Evaluation (Signed)
Anesthesia Post Note  Patient: Kaitlin RainsCheryl C Winders  Procedure(s) Performed: Procedure(s) (LRB): Irrigation and Debridement left Ankle, Remove Deep Hardware, and Place Antibiotic Beads (Left)  Anesthesia type: general  Patient location: PACU  Post pain: Pain level controlled  Post assessment: Patient's Cardiovascular Status Stable  Last Vitals:  Filed Vitals:   04/06/14 1433  BP: 100/52  Pulse: 72  Temp: 37.1 C  Resp: 18    Post vital signs: Reviewed and stable  Level of consciousness: sedated  Complications: No apparent anesthesia complications

## 2014-04-06 NOTE — Progress Notes (Signed)
Patient ID: [name], female   DOB: June 06, 1956, 58 y.o.   MRN: [id number] POD 1, infection involved bone and hardware left distal tibia, bone and hardware removed, antibiotic beads placed with Vanc and gent.  PT WBAT, no restrictions.

## 2014-04-06 NOTE — Patient Care Conference (Signed)
Inpatient RehabilitationTeam Conference and Plan of Care Update Date: 04/05/2014   Time: 10;45 Am    Patient Name: [name]      Medical Record Number: [id number]  Date of Birth: 1955-10-16 Sex: Female         Room/Bed: 4W10C/4W10C-01 Payor Info: Payor: BLUE [employer] BLUE [employer] / Plan: BCBS PPO OUT OF STATE / Product Type: *No Product type* /    Admitting Diagnosis: deconditioning Abscess Left Ankle  Admit Date/Time:  03/27/2014  3:43 PM Admission Comments: No comment available   Primary Diagnosis:  Physical debility Principal Problem: Physical debility  Patient Active Problem List   Diagnosis Date Noted  . Physical debility 03/27/2014  . CAD- S/P RCA PCI '00, LAD (x3) and CFX DES 03/22/14 03/26/2014  . ICM- EF 35-40% by echo 03/08/14 03/26/2014  . Acute respiratory failure 03/14/2014  . Acute systolic heart failure 03/14/2014  . NSTEMI (non-ST elevated myocardial infarction)   . Left leg pain- Left inguinal hematoma on CT 03/05/2014  . Anemia 03/05/2014  . Diabetes mellitus, controlled 03/05/2014  . Aftercare following surgery of the circulatory system, NEC 12/05/2013  . Wound drainage 11/22/2013  . Acute osteomyelitis involving lower leg 09/26/2013  . Diabetic foot infection 09/25/2013  . Fever 09/23/2013  . Sepsis 09/23/2013  . PVD- s/p RFPBPG April 2015 09/23/2013  . Varicose veins of lower extremities with other complications 09/07/2013  . Ulcer of lower limb 09/07/2013  . Venous stasis 09/07/2013  . Ulcer of left lower leg 09/29/2012  . Atherosclerosis of native arteries of the extremities with ulceration(440.23) 05/05/2012    Expected Discharge Date: Expected Discharge Date: 04/11/14  Team Members Present: Physician leading conference: Dr. Claudette LawsAndrew Kirsteins Social Worker Present: Kaitlin DerBecky Gerlean Cid, LCSW Nurse Present: Kaitlin EndAngie Joyce, RN PT Present: [name], PT;[name], PT;[name], PT OT Present: [name], OT SLP Present: [name], SLP PPS  Coordinator present : [name], PT     Current Status/Progress Goal Weekly Team Focus  Medical   increasing ankle pain with left ankle infection, undergoing hardware removal  resume rehab program when medically stable  ankle surgery and post op   Bowel/Bladder    (contient of bowel and bladder)  Reassess for bowel movement every 3rd day, offer duclcax supp. as needed  Continue current toilenting program   Swallow/Nutrition/ Hydration   dys 3 solids, nectar thick liquids; trials of regular water per the water protocol; supervision-mod I for use of swallowing precautions  mod I wtih least restrictive diet  trials of advanced consistencies to assess for diet tolerance    ADL's   supervision to min assist for bathing and dressing sit to stand,  min guard assist for functional transfers using the RW  supervision bathing and shower stall transfers; mod I toilet, toilet transfer with RW to toilet, and dressing  selfcare re-training, functional mobility, neuromuscular re-education, strengthening, pt/family education   Mobility   Min A transfers, Min A gait x~100' with RW, Min A 3 stairs, Mod I bed mobility and WC propulsion. Limite by ankle pain and dinfection at this point.   Supervision transfers and gait x100' with RW, Min A stairs. (distance downgraded for conservative ankle care)   Core/hip strengthening, transfer training, gait and stairs.    Communication     [facility] HealthWFL        Safety/Cognition/ Behavioral Observations    no unsafe behaviors        Pain   oferr pain med pre therapy and post therapy  Pain will be less than 5  Pain will improve to the left foot   Skin   Left foot dressing dry and intact. Change daily.Left elbow abrasion skin tear dry intact, Eschar to both great toe  dry and intact.  Skin well progress toward healing  Contiune to protect skin daily and reasses daily      *See Care Plan and progress notes for long and short-term goals.  Barriers to Discharge: multi medical  issues    Possible Resolutions to Barriers:  cont  rehab    Discharge Planning/Teaching Needs:    Home with son assisting along with sister-will await surgery and see if still on track for DC 10/20.     Team Discussion:  Surgery today to remove hardware & I & D. Possible re-eval and will see how does-possible move dc date  Revisions to Treatment Plan:  Surgery today   Continued Need for Acute Rehabilitation Level of Care: The patient requires daily medical management by a physician with specialized training in physical medicine and rehabilitation for the following conditions: Daily direction of a multidisciplinary physical rehabilitation program to ensure safe treatment while eliciting the highest outcome that is of practical value to the patient.: Yes Daily medical management of patient stability for increased activity during participation in an intensive rehabilitation regime.: Yes Daily analysis of laboratory values and/or radiology reports with any subsequent need for medication adjustment of medical intervention for : Post surgical problems;Cardiac problems;Pulmonary problems  [name], [name] 04/06/2014, 1:31 PM

## 2014-04-06 NOTE — Progress Notes (Signed)
Recreational Therapy Session Note  Patient Details  Name: [name] MRN: [id number] Date of Birth: 02-12-1956 Today's Date: 04/06/2014  Met with pt & discussed TR services.  No formal eval/treatment plan implemented as pt is scheduled for discharge 10/20.  Will continue to monitor through team. [place] 04/06/2014, 3:45 PM

## 2014-04-06 NOTE — Progress Notes (Signed)
Social Work Patient ID: [name], female   DOB: [birth year], 58 y.o.   MRN: [id number] Did speak with insurance case manager who reported it was up to MD regarding staying on rehab versus being admitted to acute after her procedure. Had informed MD of this prior to pt's return from surgery.

## 2014-04-06 NOTE — Progress Notes (Signed)
Social Work Patient ID: [name], female   DOB: [birth year], 58 y.o.   MRN: [id number] Met with pt to see how she is doing after surgery.  She reports she is doing well and doesn't have much pain, she thought she might. She is glad to be back on rehab and doing eher therapies, she doesn;t feel she lost any ground.  Will check with therapists and see if still can reach her goals by next Tues.

## 2014-04-06 NOTE — Progress Notes (Signed)
Speech Language Pathology Daily Session Note  Patient Details  Name: [name] MRN: [id number] Date of Birth: 1955-11-25  Today's Date: 04/06/2014 SLP Individual Time: [id number] SLP Individual Time Calculation (min): 45 min  Short Term Goals: Week 2: SLP Short Term Goal 1 (Week 2): Pt will consume presentations of her currently prescribed diet with no overt s/s of aspiration and mod I use of swallowing precautions.  SLP Short Term Goal 2 (Week 2): Pt will tolerate trials of thin liquids with meals with no overt s/s of aspiration  SLP Short Term Goal 3 (Week 2): Pt will consume water and ice chips while utilizing precautions from the water protocol with mod I.  Skilled Therapeutic Interventions:  Pt was seen for skilled speech therapy targeting goals for dysphagia.  SLP noted pt diet orders had been changed to full nectar thick liquids following yesterday's surgery.  Per RN, pt's diet orders may be advanced as tolerated.  Upon arrival pt was noted with three partially drank containers of thin liquids including coffee, juice, and milk.   SLP reviewed and reinforced skilled education related to rationale behind pt's currently prescribed diet including pt's risk of silent aspiration without the use of an immediate throat clear following sips of thin liquids.  Pt reported that she had been receiving thin liquids since dinner yesterday evening.  RN made aware of error.  SLP completed skilled observations with presentations of purees, solid cracker consistencies, and trials of thin liquids with meals with pt requiring overall min assist faded to supervision for use of throat clear to minimize risk of aspiration.  No overt s/s of aspiration or difficulty tolerating solids noted at bedside.  Recommend that pt's diet of nectar thick liquids and dys 3 solids be resumed with continued trials of thin liquids with meals to reinforce use of swallowing precautions with meals.  Continue per current plan of care.    FIM:  Comprehension Comprehension Mode: Auditory Comprehension: 6-Follows complex conversation/direction: With extra time/assistive device Expression Expression Mode: Verbal Expression: 6-Expresses complex ideas: With extra time/assistive device Social Interaction Social Interaction: 7-Interacts appropriately with others - No medications needed. Problem Solving Problem Solving: 5-Solves basic 90% of the time/requires cueing < 10% of the time Memory Memory: 6-More than reasonable amt of time FIM - Eating Eating Activity: 5: Supervision/cues  Pain Pain Assessment Pain Assessment: No/denies pain   Therapy/Group: Individual Therapy  [name], [name] 04/06/2014, 11:13 AM

## 2014-04-06 NOTE — Progress Notes (Signed)
ANTIBIOTIC CONSULT NOTE - Follow up Pharmacy Consult for Vancomycin  Indication: left ankle abscess  Allergies  Allergen Reactions  . Penicillins Hives    Tolerates cefepime  . Adhesive [Tape] Rash    Patient Measurements: Height: 5\' 2"  (157.5 cm) Weight: 170 lb 3.1 oz (77.2 kg) IBW/kg (Calculated) : 50.1 Adjusted Body Weight: 61 kg  Vital Signs: Temp: 98 F (36.7 C) (10/15 0303) Temp Source: Oral (10/15 0303) BP: 114/51 mmHg (10/15 0303) Pulse Rate: 61 (10/15 0303) Intake/Output from previous day: 10/14 0701 - 10/15 0700 In: 620 [P.O.:320; I.V.:300] Out: -  Intake/Output from this shift: Total I/O In: 600 [P.O.:600] Out: -   Labs:  Recent Labs  04/03/14 1244 04/05/14 0626  WBC 5.7  --   HGB 11.5*  --   PLT 251  --   CREATININE 0.81 0.60   Estimated Creatinine Clearance: 74.6 ml/min (by C-G formula based on Cr of 0.6).  Recent Labs  04/06/14 0746  VANCOTROUGH 21.2*     Microbiology: Recent Results (from the past 720 hour(s))  CULTURE, BLOOD (ROUTINE X 2)     Status: None   Collection Time    03/14/14 11:06 AM      Result Value Ref Range Status   Specimen Description BLOOD LEFT HAND   Final   Special Requests BOTTLES DRAWN AEROBIC AND ANAEROBIC 10CC   Final   Culture  Setup Time     Final   Value: 03/14/2014 16:45     Performed at [facility]   Culture     Final   Value: STAPHYLOCOCCUS SPECIES (COAGULASE NEGATIVE)     Note: THE SIGNIFICANCE OF ISOLATING THIS ORGANISM FROM A SINGLE SET OF BLOOD CULTURES WHEN MULTIPLE SETS ARE DRAWN IS UNCERTAIN. PLEASE NOTIFY THE MICROBIOLOGY DEPARTMENT WITHIN ONE WEEK IF SPECIATION AND SENSITIVITIES ARE REQUIRED.     Note: Gram Stain Report Called to,Read Back By and Verified With: MONICA CROSS TAKEN FOR NURSE 12:55AM 03/16/14 THOMI     Performed at [facility]   Report Status 03/17/2014 FINAL   Final  CULTURE, BLOOD (ROUTINE X 2)     Status: None   Collection Time    03/14/14 11:16 AM      Result  Value Ref Range Status   Specimen Description BLOOD RIGHT HAND   Final   Special Requests BOTTLES DRAWN AEROBIC AND ANAEROBIC 10CC   Final   Culture  Setup Time     Final   Value: 03/14/2014 16:43     Performed at [facility]   Culture     Final   Value: NO GROWTH 5 DAYS     Performed at [facility]   Report Status 03/20/2014 FINAL   Final  CULTURE, BAL-QUANTITATIVE     Status: None   Collection Time    03/14/14 12:47 PM      Result Value Ref Range Status   Specimen Description BRONCHIAL ALVEOLAR LAVAGE   Final   Special Requests Normal   Final   Gram Stain     Final   Value: FEW WBC PRESENT,BOTH PMN AND MONONUCLEAR     RARE SQUAMOUS EPITHELIAL CELLS PRESENT     NO ORGANISMS SEEN     Performed at [facility] Count     Final   Value: 10,000 COLONIES/ML     Performed at [facility]   Culture     Final   Value: Non-Pathogenic Oropharyngeal-type Flora Isolated.  Performed at [facility]   Report Status 03/16/2014 FINAL   Final  AFB CULTURE WITH SMEAR     Status: None   Collection Time    03/15/14  2:33 PM      Result Value Ref Range Status   Specimen Description FLUID LEFT PLEURAL   Final   Special Requests NONE   Final   Acid Fast Smear     Final   Value: NO ACID FAST BACILLI SEEN     Performed at [facility]   Culture     Final   Value: CULTURE WILL BE EXAMINED FOR 6 WEEKS BEFORE ISSUING A FINAL REPORT     Performed at [facility]   Report Status PENDING   Incomplete  BODY FLUID CULTURE     Status: None   Collection Time    03/15/14  2:33 PM      Result Value Ref Range Status   Specimen Description FLUID LEFT PLEURAL   Final   Special Requests NONE   Final   Gram Stain     Final   Value: RARE WBC PRESENT, PREDOMINANTLY MONONUCLEAR     NO ORGANISMS SEEN     Performed at [facility]   Culture     Final   Value: NO GROWTH 3 DAYS     Performed at [facility]   Report Status  03/19/2014 FINAL   Final  FUNGUS CULTURE W SMEAR     Status: None   Collection Time    03/15/14  2:33 PM      Result Value Ref Range Status   Specimen Description PLEURAL LEFT   Final   Special Requests NONE   Final   Fungal Smear     Final   Value: NO YEAST OR FUNGAL ELEMENTS SEEN     Performed at [facility]   Culture     Final   Value: CULTURE IN PROGRESS FOR FOUR WEEKS     Performed at [facility]   Report Status PENDING   Incomplete  MRSA PCR SCREENING     Status: None   Collection Time    03/22/14  4:35 PM      Result Value Ref Range Status   MRSA by PCR NEGATIVE  NEGATIVE Final   Comment:            The GeneXpert MRSA Assay (FDA     approved for NASAL specimens     only), is one component of a     comprehensive MRSA colonization     surveillance program. It is not     intended to diagnose MRSA     infection nor to guide or     monitor treatment for     MRSA infections.  WOUND CULTURE     Status: None   Collection Time    04/05/14  1:47 PM      Result Value Ref Range Status   Specimen Description WOUND ANKLE LEFT   Final   Special Requests PT ON VANCOMYCIN   Final   Gram Stain     Final   Value: RARE WBC PRESENT, PREDOMINANTLY MONONUCLEAR     NO SQUAMOUS EPITHELIAL CELLS SEEN     NO ORGANISMS SEEN     Performed at [facility]   Culture     Final   Value: NO GROWTH 1 DAY     Performed at [facility]   Report Status  PENDING   Incomplete  ANAEROBIC CULTURE     Status: None   Collection Time    04/05/14  1:47 PM      Result Value Ref Range Status   Specimen Description WOUND ANKLE LEFT   Final   Special Requests PT ON VANCOMYCIN   Final   Gram Stain     Final   Value: RARE WBC PRESENT, PREDOMINANTLY MONONUCLEAR     NO SQUAMOUS EPITHELIAL CELLS SEEN     NO ORGANISMS SEEN     [facility]   Culture     Final   Value: NO ANAEROBES ISOLATED; CULTURE IN PROGRESS FOR 5 DAYS     [facility]   Report Status PENDING   Incomplete    Medical History: Past Medical History  Diagnosis Date  . Diabetes mellitus   . Peripheral vascular disease   . Hyperlipidemia   . Obesity   . Hypertension   . Arthritis   . Ulcer of ankle     right  . Coronary artery disease   . COPD (chronic obstructive pulmonary disease)   . NSTEMI (non-ST elevated myocardial infarction)     Medications:  Prescriptions prior to admission  Medication Sig Dispense Refill  . alprazolam (XANAX) 2 MG tablet Take 1 mg by mouth every 6 (six) hours as needed for sleep or anxiety.       Marland Kitchen. aspirin 81 MG chewable tablet Chew 1 tablet (81 mg total) by mouth daily.  30 tablet  0  . atorvastatin (LIPITOR) 80 MG tablet Take 1 tablet (80 mg total) by mouth daily at 6 PM.  30 tablet  0  . bisoprolol (ZEBETA) 5 MG tablet Take 1 tablet (5 mg total) by mouth daily.  30 tablet  0  . clopidogrel (PLAVIX) 75 MG tablet Take 1 tablet (75 mg total) by mouth daily.  30 tablet  0  . escitalopram (LEXAPRO) 10 MG tablet Take 10 mg by mouth daily.      . fentaNYL (DURAGESIC - DOSED MCG/HR) 100 MCG/HR Place 100 mcg onto the skin every 3 (three) days.      . fluticasone (FLONASE) 50 MCG/ACT nasal spray Place 1 spray into both nostrils daily as needed for allergies.       . furosemide (LASIX) 40 MG tablet Take 1 tablet (40 mg total) by mouth daily.  30 tablet  0  . gabapentin (NEURONTIN) 600 MG tablet Take 600 mg by mouth 3 (three) times daily.      Marland Kitchen. glimepiride (AMARYL) 2 MG tablet Take 2 mg by mouth 2 (two) times daily.      . insulin glargine (LANTUS) 100 UNIT/ML injection Inject 0.2 mLs (20 Units total) into the skin daily.  10 mL  11  . losartan (COZAAR) 25 MG tablet Take 1 tablet (25 mg total) by mouth at bedtime.  30 tablet  0  . metFORMIN (GLUCOPHAGE) 500 MG tablet Take 1,000 mg by mouth 2 (two) times daily with a meal.      . oxycodone (ROXICODONE) 30 MG immediate release tablet Take 30 mg by mouth every 4 (four) hours  as needed for pain.       Marland Kitchen. spironolactone (ALDACTONE) 12.5 mg TABS tablet Take 0.5 tablets (12.5 mg total) by mouth daily.  30 tablet  0  . VESICARE 5 MG tablet Take 5 mg by mouth daily.        Scheduled:  . aspirin  81  mg Oral Daily  . atorvastatin  80 mg Oral q1800  . bisoprolol  5 mg Oral Daily  . clopidogrel  75 mg Oral Daily  . docusate sodium  100 mg Oral BID  . enoxaparin (LOVENOX) injection  30 mg Subcutaneous Q24H  . escitalopram  10 mg Oral Daily  . feeding supplement (ENSURE)  1 Container Oral Q1400  . feeding supplement (GLUCERNA SHAKE)  237 mL Oral Q1500  . furosemide  40 mg Oral Daily  . gabapentin  300 mg Oral TID  . glimepiride  2 mg Oral Q breakfast  . insulin aspart  0-5 Units Subcutaneous QHS  . insulin aspart  0-9 Units Subcutaneous TID WC  . insulin aspart  4 Units Subcutaneous TID WC  . losartan  25 mg Oral QHS  . metFORMIN  1,000 mg Oral BID WC  . MUSCLE RUB   Topical TID AC  . nitroGLYCERIN  0.4 mg Transdermal Q24H  . nystatin   Topical TID  . nystatin cream   Topical BID  . protein supplement  1 scoop Oral TID WC  . spironolactone  12.5 mg Oral Daily  . vancomycin  1,000 mg Intravenous Q12H   Assessment: Steady state vancomycin trough = 20.1 mcg/ml on current dosage of vancomycin 1000 mg IV q12h day#4 for left ankel abscess in this 57yo female  Pt is afebrile, WBC wnl, sCr 0.6 with CrCl 74 ml/min. POD#1 s/p I&D left ankle, removal of deep hardware and place antibiotic beads. Ortho MD noted no signs of chronic osteomyelitis.  Vancomycin trough is above goal of 10-4015mcg/ml. I will adjust dose.  10/14 left ankle wound culture pending   Goal of Therapy:  Vancomycin trough level 10-15 mcg/ml  Plan:  Decrease Vancomycin to 750mg  IV q12h Measure antibiotic drug levels at steady state Follow up culture results and renal function  Noah Delaineuth Emeril Stille, RPh Clinical Pharmacist Pager: [phone] 04/06/2014,12:16 PM

## 2014-04-06 NOTE — Progress Notes (Addendum)
Physical Therapy Session Note  Patient Details  Name: [name] MRN: [id number] Date of Birth: September 30, 1955  Today's Date: 04/06/2014 PT Individual Time: 0908-1030 PT Individual Time Calculation (min): 82 min   Short Term Goals: Week 1:  PT Short Term Goal 1 (Week 1): STG=LTG due to LOS, S overall PT Short Term Goal 1 - Progress (Week 1): Progressing toward goal Week 2:  PT Short Term Goal 1 (Week 2): STG=LTG, S overall including 2 stairs with bil rails min A  Skilled Therapeutic Interventions/Progress Updates: Dr. Lajoyce Cornersuda ordered PT eval and treat. Pt now WBAT LLE after L ankle surgery; wearing L surgical shoe.  Order acknowledged; ELOS and POC remain the same, so treatment will continue toward same LTGs.  Pt rated L ankle pain 8/10 at beginning of session; premedicated.    Bed> w/c to L stand pivot with mod assist for lifting assistance. Toilet transfer with bil hands on wall bar, plus RW, mod assist. See FIM.   Gait x 60' on level tile with mod assist to stand, min assist with mod cues for upright trunk, step lengths, step sequence.  Up/down 5 steps 2 rails, mod assist, mod cues for sequencing; step to method leading up with R foot, down sideways somewhat with L foot.  W/c propulsion x 157' x 2 modified independent. LLE elevated on legrest whenever pt up on w/c.  neuromuscular re-education via VCs, demo, tactile cues for hip and core muscle recruitment for functional mobility: 2 x 10 modified abdominal crunches, 10 x 1 each straight leg raises L and R with isometric hold at end range, bil hip adduction/abduction against resistance; also biomechanics for sit>< stand.  Pt rated pain 6/10 L ankle at end of session.  Pt left resting in bed, L LE elevated, with all needs within reach.    Therapy Documentation Precautions:  Precautions Precautions: Fall Precaution Comments: fell 9/28 with nursing (missed EOB when sitting from stedy) Restrictions Weight Bearing Restrictions: Yes LLE  Weight Bearing: Weight bearing as tolerated Other Position/Activity Restrictions: surgical shoe; elevae whenever possible Pain: Pain Assessment Pain Assessment: 0-10 Pain Score: 8  Pain Type: Chronic pain Pain Location: Ankle Pain Orientation: Left Pain Descriptors / Indicators: Aching Pain Intervention(s): Medication (See eMAR)      See FIM for current functional status  Therapy/Group: Individual Therapy  [name] 04/06/2014, 3:49 PM

## 2014-04-06 NOTE — Progress Notes (Signed)
58 y.o. 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   Subjective/Complaints: Did well with PT this am Reviewed ortho note WBAT No post op compications Review of Systems -Left ankle a little sore Objective: Vital Signs: Blood pressure 114/51, pulse 61, temperature 98 F (36.7 C), temperature source Oral, resp. rate 18, height _0  (1.575 m), weight 77.2 kg (170 lb 3.1 oz), SpO2 100.00%. No results found. Results for orders placed during the hospital encounter of 03/27/14 (from the past 72 hour(s))  GLUCOSE, CAPILLARY     Status: Abnormal   Collection Time    04/03/14 12:16 PM      Result Value Ref Range   Glucose-Capillary 137 (*) 70 - 99 mg/dL  CBC WITH DIFFERENTIAL     Status: Abnormal   Collection Time    04/03/14 12:44 PM      Result Value Ref Range   WBC 5.7  4.0 - 10.5 K/uL   RBC 4.63  3.87 - 5.11 MIL/uL   Hemoglobin 11.5 (*) 12.0 - 15.0 g/dL   HCT 37.0  36.0 - 46.0 %   MCV 79.9  78.0 - 100.0 fL   MCH 24.8 (*) 26.0 - 34.0 pg   MCHC 31.1  30.0 - 36.0 g/dL   RDW 21.6 (*) 11.5 - 15.5 %   Platelets 251  150 - 400 K/uL   Neutrophils Relative % 54  43 - 77 %   Lymphocytes Relative 31  12 - 46 %   Monocytes Relative 12  3 - 12 %   Eosinophils Relative 3  0 - 5 %   Basophils Relative 0  0 - 1 %   Neutro Abs 3.0  1.7 - 7.7 K/uL   Lymphs Abs 1.8  0.7 - 4.0 K/uL   Monocytes Absolute 0.7  0.1 - 1.0 K/uL   Eosinophils Absolute 0.2  0.0 - 0.7 K/uL   Basophils Absolute 0.0  0.0 - 0.1 K/uL   WBC Morphology ATYPICAL LYMPHOCYTES     Smear Review LARGE PLATELETS PRESENT    BASIC METABOLIC PANEL     Status: Abnormal   Collection Time    04/03/14 12:44 PM      Result Value Ref Range   Sodium 136 (*) 137 - 147 mEq/L   Potassium 5.1  3.7 - 5.3 mEq/L   Chloride 94 (*) 96 - 112 mEq/L   CO2 26  19 - 32 mEq/L   Glucose, Bld 155 (*) 70 - 99  mg/dL   BUN 19  6 - 23 mg/dL   Creatinine, Ser 0.81  0.50 - 1.10 mg/dL   Comment: REPEATED TO VERIFY     DELTA CHECK NOTED   Calcium 9.6  8.4 - 10.5 mg/dL   GFR calc non Af Amer 80 (*) >90 mL/min   GFR calc Af Amer >90  >90 mL/min   Comment: (NOTE)     The eGFR has been calculated using the CKD EPI equation.     This calculation has not been validated in all clinical situations.     eGFR's persistently <90 mL/min signify possible Chronic Kidney     Disease.   Anion gap 16 (*) 5 - 15  GLUCOSE, CAPILLARY     Status: Abnormal   Collection Time    04/03/14  4:05 PM      Result Value  Ref Range   Glucose-Capillary 120 (*) 70 - 99 mg/dL  GLUCOSE, CAPILLARY     Status: Abnormal   Collection Time    04/03/14  8:10 PM      Result Value Ref Range   Glucose-Capillary 104 (*) 70 - 99 mg/dL  GLUCOSE, CAPILLARY     Status: Abnormal   Collection Time    04/04/14  7:15 AM      Result Value Ref Range   Glucose-Capillary 115 (*) 70 - 99 mg/dL  GLUCOSE, CAPILLARY     Status: Abnormal   Collection Time    04/04/14 11:26 AM      Result Value Ref Range   Glucose-Capillary 131 (*) 70 - 99 mg/dL  GLUCOSE, CAPILLARY     Status: Abnormal   Collection Time    04/04/14  4:56 PM      Result Value Ref Range   Glucose-Capillary 177 (*) 70 - 99 mg/dL  GLUCOSE, CAPILLARY     Status: Abnormal   Collection Time    04/04/14  9:08 PM      Result Value Ref Range   Glucose-Capillary 103 (*) 70 - 99 mg/dL  BASIC METABOLIC PANEL     Status: Abnormal   Collection Time    04/05/14  6:26 AM      Result Value Ref Range   Sodium 137  137 - 147 mEq/L   Potassium 5.0  3.7 - 5.3 mEq/L   Chloride 98  96 - 112 mEq/L   CO2 26  19 - 32 mEq/L   Glucose, Bld 143 (*) 70 - 99 mg/dL   BUN 17  6 - 23 mg/dL   Creatinine, Ser 0.60  0.50 - 1.10 mg/dL   Calcium 9.2  8.4 - 10.5 mg/dL   GFR calc non Af Amer >90  >90 mL/min   GFR calc Af Amer >90  >90 mL/min   Comment: (NOTE)     The eGFR has been calculated using the CKD  EPI equation.     This calculation has not been validated in all clinical situations.     eGFR's persistently <90 mL/min signify possible Chronic Kidney     Disease.   Anion gap 13  5 - 15  GLUCOSE, CAPILLARY     Status: Abnormal   Collection Time    04/05/14  6:59 AM      Result Value Ref Range   Glucose-Capillary 130 (*) 70 - 99 mg/dL  GLUCOSE, CAPILLARY     Status: Abnormal   Collection Time    04/05/14 11:43 AM      Result Value Ref Range   Glucose-Capillary 185 (*) 70 - 99 mg/dL  WOUND CULTURE     Status: None   Collection Time    04/05/14  1:47 PM      Result Value Ref Range   Specimen Description WOUND ANKLE LEFT     Special Requests PT ON VANCOMYCIN     Gram Stain       Value: RARE WBC PRESENT, PREDOMINANTLY MONONUCLEAR     NO SQUAMOUS EPITHELIAL CELLS SEEN     NO ORGANISMS SEEN     Performed at [facility]   Culture       Value: NO GROWTH 1 DAY     Performed at [facility]   Report Status PENDING    ANAEROBIC CULTURE     Status: None   Collection Time    04/05/14  1:47 PM  Result Value Ref Range   Specimen Description WOUND ANKLE LEFT     Special Requests PT ON VANCOMYCIN     Gram Stain       Value: RARE WBC PRESENT, PREDOMINANTLY MONONUCLEAR     NO SQUAMOUS EPITHELIAL CELLS SEEN     NO ORGANISMS SEEN     Performed at [facility]   Culture PENDING     Report Status PENDING    GLUCOSE, CAPILLARY     Status: Abnormal   Collection Time    04/05/14  2:09 PM      Result Value Ref Range   Glucose-Capillary 148 (*) 70 - 99 mg/dL   Comment 1 Documented in Chart     Comment 2 Notify RN    GLUCOSE, CAPILLARY     Status: Abnormal   Collection Time    04/05/14  4:56 PM      Result Value Ref Range   Glucose-Capillary 134 (*) 70 - 99 mg/dL  GLUCOSE, CAPILLARY     Status: Abnormal   Collection Time    04/05/14  9:07 PM      Result Value Ref Range   Glucose-Capillary 196 (*) 70 - 99 mg/dL   Comment 1 Notify RN    GLUCOSE, CAPILLARY      Status: Abnormal   Collection Time    04/06/14  6:47 AM      Result Value Ref Range   Glucose-Capillary 165 (*) 70 - 99 mg/dL  VANCOMYCIN, TROUGH     Status: Abnormal   Collection Time    04/06/14  7:46 AM      Result Value Ref Range   Vancomycin Tr 21.2 (*) 10.0 - 20.0 ug/mL      Head: Normocephalic and atraumatic.  Eyes: Pupils are equal, round, and reactive to light.  Neck: Neck supple.  Dry dressing right neck--site of prior central line. Non tender Cardiovascular: Normal rate. Normal rhythm. No murmurs  Respiratory: Effort normal and breath sounds normal. No respiratory distress. She has no wheezes.  GI: Soft. Bowel sounds are normal.  Musculoskeletal: She exhibits no edema and no tenderness. BLE with evidence of muscle atrophy. 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 Externally rotated, pes planus, Right 2nd toe amp L ant ankle medial joint line erythema, raised area mild tenderness Neurological: She is alert and oriented to person, place, and time.  . Follows basic commands without difficulty. UE: 4/5 proximal to distal. LE: 2+ HF, 3 KE, 4/5 ankle. Decreased PP/LT below knees bilaterally below knees.  Skin: Dry ulcerated areas bilateral great toes Bilateral shins with chronic vascular changes. Marland Kitchen  Psychiatric: She has a normal mood and affect. Her behavior is normal. Judgment and thought content normal.    Assessment/Plan: 1. Functional deficits secondary to deconditioning after multiple medical issues/sepsis/respiratory failure   which require 3+ hours per day of interdisciplinary therapy in a comprehensive inpatient rehab setting. Physiatrist is providing close team supervision and 24 hour management of active medical problems listed below. Physiatrist and rehab team continue to assess barriers to discharge/monitor patient progress toward functional and medical goals. Cont therapy WBAT left foot/ankle  FIM: FIM - Bathing Bathing Steps Patient Completed:  Chest;Right Arm;Left Arm;Abdomen;Front perineal area;Buttocks;Right upper leg;Left upper leg;Right lower leg (including foot);Left lower leg (including foot) Bathing: 5: Set-up assist to: Obtain items  FIM - Upper Body Dressing/Undressing Upper body dressing/undressing steps patient completed: Thread/unthread right sleeve of pullover shirt/dresss;Put head through opening of pull over shirt/dress;Thread/unthread left sleeve of  pullover shirt/dress;Pull shirt over trunk Upper body dressing/undressing: 5: Set-up assist to: Obtain clothing/put away FIM - Lower Body Dressing/Undressing Lower body dressing/undressing steps patient completed: Thread/unthread right underwear leg;Thread/unthread left underwear leg;Thread/unthread right pants leg;Thread/unthread left pants leg;Don/Doff right sock;Don/Doff left sock;Pull underwear up/down;Pull pants up/down Lower body dressing/undressing: 4: Steadying Assist  FIM - Toileting Toileting steps completed by patient: Adjust clothing prior to toileting;Performs perineal hygiene;Adjust clothing after toileting Toileting Assistive Devices: Grab bar or rail for support Toileting: 4: Steadying assist  FIM - Radio producer Devices: Walker;Grab bars;Elevated toilet seat Toilet Transfers: 4-To toilet/BSC: Min A (steadying Pt. > 75%);4-From toilet/BSC: Min A (steadying Pt. > 75%)  FIM - Bed/Chair Transfer Bed/Chair Transfer Assistive Devices: Copy: 6: Sit > Supine: No assist;5: Chair or W/C > Bed: Supervision (verbal cues/safety issues)  FIM - Locomotion: Wheelchair Distance: 150 Locomotion: Wheelchair: 0: Activity did not occur FIM - Locomotion: Ambulation Locomotion: Ambulation Assistive Devices: Administrator Ambulation/Gait Assistance: 4: Min guard Locomotion: Ambulation: 0: Activity did not occur  Comprehension Comprehension Mode: Auditory Comprehension: 6-Follows complex conversation/direction: With  extra time/assistive device  Expression Expression Mode: Verbal Expression: 6-Expresses complex ideas: With extra time/assistive device  Social Interaction Social Interaction Mode: Asleep Social Interaction: 7-Interacts appropriately with others - No medications needed.  Problem Solving Problem Solving Mode: Asleep Problem Solving: 5-Solves basic 90% of the time/requires cueing < 10% of the time  Memory Memory Mode: Asleep Memory: 6-Assistive device: No helper  Medical Problem List and Plan:   1. Functional deficits secondary to deconditioning after multiple medical issues/sepsis/respiratory failure   2. DVT Prophylaxis/Anticoagulation: Pharmaceutical: Lovenox   3. Chronic pain/Pain Management: continue Neurontin tid. Used fentanyl 100 mg as well as oxycodone 30 mg prn at home for chronic bilateral knee as well as neuropathic foot pain.   4. Mood: team to provide ego support. Continue Lexapro. LCSW to follow for support and evaluation.   5. Neuropsych: This patient is capable of making decisions on her own behalf.   6. Skin/Wound Care: routine pressure relief measures. Monitor left groin daily.   7. Fluids/Electrolytes/Nutrition: Monitor I/O.   8. NSTEMI s/p PCI RCA, CAD and CFX DES: Continue ASA/Plavix. On Imdur, bisoprolol and Lipitor.   9. Acute systolic heart failure: Monitor daily weights and for other signs of overload. Continue Zebeta, lasix 40 mg/day, Cozaar 25 mg/day and aldactone 12. 24m /day. Monitor for tolerance of medications.   10 DM type 2 with neuropathy: Hgb A1c- 7.2. Will monitor BS with ac/hs checks. Continue lantus insulin 20 units daily. SSI for elevated BS.   11. Left ant ankle cyst start doxy, ID rec no abx will d/c  12. ABLA due to left groin hematoma: H/H improving. Will monitor for stability. Recheck in am.   13. Dysphagia: Diet advanced to Dysphagia 3 texturesand should help po intake. Continue honey liquids and push po fluids to maintain adequate hydration.    14. Hypotension: Monitor blood pressures every 8 hours and for signs orthostatic signs.   15. Severe protein malnutrition: Albumin-2.4. Continue ensure supplements bid.       LOS (Days) 10 A FACE TO FACE EVALUATION WAS PERFORMED  KIRSTEINS,ANDREW E 04/06/2014, 10:29 AM

## 2014-04-06 NOTE — Progress Notes (Signed)
Occupational Therapy Session Note  Patient Details  Name: Kaitlin RainsCheryl C Preziosi MRN: [id number] Date of Birth: 08-22-1955  Today's Date: 04/06/2014 OT Individual Time: 1101-1201 and 1501 - 1533 OT Individual Time Calculation (min): 60 min and 32 min   Short Term Goals: Week 2:  OT Short Term Goal 1 (Week 2): Pt will perform all dressing sit to stand with modified independence. OT Short Term Goal 2 (Week 2): Pt will peform toilet transfer with modified indepedence using the RW. OT Short Term Goal 3 (Week 2): Pt will perform tub/shower transfers with supervision using the RW. OT Short Term Goal 4 (Week 2): Pt will perform simple home management tasks with supervision using the RW for support.  Skilled Therapeutic Interventions/Progress Updates:  Session 1:Pt supine in bed with 2/10 pain in L LE this session. Pt receiving IV fluids at this time and agreeable to bathing and dressing seated EOB this session. OT session with focus on dynamic standing balance, pt and family education, ADL retraining, STS, and endurance. STS x 6 reps with steady assist for safety and use of RW. Off loading shoe also donned independently by patient for standing safely. Dynamic standing balance when washing peri area, washing buttocks, and clothing management all requiring steady assist with use of RW. Pt performed grooming tasks seated in wheelchair at sink side. Pt seated in chair with sister present, call bell, and all other needed items within reach.    Session 2: Upon entering the room, pt seated on EOB with son present in room. Pt with 5/10 c/o pain in L LE and reports, "It hurts more when I just lay here." Pt transferred to wheelchair with assistance to block L foot to keep from sliding and steady assist. Pt propelled wheelchair to elevator and then into gift shop by navigating through aisles and tight areas. Pt purchased an item from cashier with correct change. Pt propelled self in wheelchair~ 250 + feet  with BUEs back  onto elevator and to room. Son also went with pt down to gift shop in order to see patient progress with wheelchair management in community setting. [facility] and all other needed items within reach upon exiting the room.   Therapy Documentation Precautions:  Precautions Precautions: Fall Precaution Comments: fell 9/28 with nursing (missed EOB when sitting from stedy) Restrictions Weight Bearing Restrictions: Yes LLE Weight Bearing: Weight bearing as tolerated Other Position/Activity Restrictions: surgical shoe; elevae whenever possible ADL: ADL ADL Comments: Refer to FIM  See FIM for current functional status  Therapy/Group: Individual Therapy  Lowella Gripittman, Alexandra Posadas L 04/06/2014, 12:52 PM

## 2014-04-07 ENCOUNTER — Inpatient Hospital Stay (HOSPITAL_COMMUNITY): Payer: BC Managed Care – PPO | Admitting: Occupational Therapy

## 2014-04-07 ENCOUNTER — Inpatient Hospital Stay (HOSPITAL_COMMUNITY): Payer: BC Managed Care – PPO

## 2014-04-07 ENCOUNTER — Ambulatory Visit (HOSPITAL_COMMUNITY): Payer: BC Managed Care – PPO | Admitting: Speech Pathology

## 2014-04-07 ENCOUNTER — Inpatient Hospital Stay (HOSPITAL_COMMUNITY): Payer: BC Managed Care – PPO | Admitting: *Deleted

## 2014-04-07 LAB — BASIC METABOLIC PANEL
ANION GAP: 18 — AB (ref 5–15)
BUN: 12 mg/dL (ref 6–23)
CO2: 24 meq/L (ref 19–32)
CREATININE: 0.65 mg/dL (ref 0.50–1.10)
Calcium: 9.3 mg/dL (ref 8.4–10.5)
Chloride: 94 mEq/L — ABNORMAL LOW (ref 96–112)
GFR calc Af Amer: >90 mL/min (ref >90)
GFR calc non Af Amer: >90 mL/min (ref >90)
GLUCOSE: 135 mg/dL — AB (ref 70–99)
Potassium: 4.2 mEq/L (ref 3.7–5.3)
Sodium: 136 mEq/L — ABNORMAL LOW (ref 137–147)

## 2014-04-07 LAB — GLUCOSE, CAPILLARY
GLUCOSE-CAPILLARY: 146 mg/dL — AB (ref 70–99)
Glucose-Capillary: 158 mg/dL — ABNORMAL HIGH (ref 70–99)
Glucose-Capillary: 112 mg/dL — ABNORMAL HIGH (ref 70–99)
Glucose-Capillary: 145 mg/dL — ABNORMAL HIGH (ref 70–99)

## 2014-04-07 LAB — WOUND CULTURE: Culture: NO GROWTH

## 2014-04-07 MED ORDER — ALPRAZOLAM 0.5 MG PO TABS
0.5000 mg | ORAL_TABLET | Freq: Every evening | ORAL | Status: DC | PRN
  Administered 2014-04-07 – 2014-04-08 (×2): 0.5 mg via ORAL
  Filled 2014-04-07 (×2): qty 1

## 2014-04-07 MED ORDER — ENOXAPARIN SODIUM 40 MG/0.4ML ~~LOC~~ SOLN
40.0000 mg | SUBCUTANEOUS | Status: DC
  Administered 2014-04-07 – 2014-04-10 (×4): 40 mg via SUBCUTANEOUS
  Filled 2014-04-07 (×5): qty 0.4

## 2014-04-07 NOTE — Progress Notes (Signed)
Social Work Patient ID: [name], female   DOB: October 20, 1955, 58 y.o.   MRN: [id number] Met with pt to discuss family education with son.  He can be here Monday at 10;00 will have pt scheduled then.  Pleased with being able to drink thin Liquids.  Work toward discharge for Tuesday.

## 2014-04-07 NOTE — Progress Notes (Signed)
58 y.o. 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   Subjective/Complaints: No SOB, CP, Slept well Review of Systems -Left ankle doing better Objective: Vital Signs: Blood pressure 107/53, pulse 61, temperature 97.6 F (36.4 C), temperature source Oral, resp. rate 18, height $RemoveBe'5\' 2"'xvBQjlMNk$  (1.575 m), weight 78.8 kg (173 lb 11.6 oz), SpO2 96.00%. No results found. Results for orders placed during the hospital encounter of 03/27/14 (from the past 72 hour(s))  GLUCOSE, CAPILLARY     Status: Abnormal   Collection Time    04/04/14 11:26 AM      Result Value Ref Range   Glucose-Capillary 131 (*) 70 - 99 mg/dL  GLUCOSE, CAPILLARY     Status: Abnormal   Collection Time    04/04/14  4:56 PM      Result Value Ref Range   Glucose-Capillary 177 (*) 70 - 99 mg/dL  GLUCOSE, CAPILLARY     Status: Abnormal   Collection Time    04/04/14  9:08 PM      Result Value Ref Range   Glucose-Capillary 103 (*) 70 - 99 mg/dL  BASIC METABOLIC PANEL     Status: Abnormal   Collection Time    04/05/14  6:26 AM      Result Value Ref Range   Sodium 137  137 - 147 mEq/L   Potassium 5.0  3.7 - 5.3 mEq/L   Chloride 98  96 - 112 mEq/L   CO2 26  19 - 32 mEq/L   Glucose, Bld 143 (*) 70 - 99 mg/dL   BUN 17  6 - 23 mg/dL   Creatinine, Ser 0.60  0.50 - 1.10 mg/dL   Calcium 9.2  8.4 - 10.5 mg/dL   GFR calc non Af Amer >90  >90 mL/min   GFR calc Af Amer >90  >90 mL/min   Comment: (NOTE)     The eGFR has been calculated using the CKD EPI equation.     This calculation has not been validated in all clinical situations.     eGFR's persistently <90 mL/min signify possible Chronic Kidney     Disease.   Anion gap 13  5 - 15  GLUCOSE, CAPILLARY     Status: Abnormal   Collection Time    04/05/14  6:59 AM      Result Value Ref Range   Glucose-Capillary 130 (*) 70 - 99 mg/dL   GLUCOSE, CAPILLARY     Status: Abnormal   Collection Time    04/05/14 11:43 AM      Result Value Ref Range   Glucose-Capillary 185 (*) 70 - 99 mg/dL  WOUND CULTURE     Status: None   Collection Time    04/05/14  1:47 PM      Result Value Ref Range   Specimen Description WOUND ANKLE LEFT     Special Requests PT ON VANCOMYCIN     Gram Stain       Value: RARE WBC PRESENT, PREDOMINANTLY MONONUCLEAR     NO SQUAMOUS EPITHELIAL CELLS SEEN     NO ORGANISMS SEEN     Performed at [facility]   Culture       Value: NO GROWTH 2 DAYS     Performed at [facility]   Report Status 04/07/2014 FINAL    ANAEROBIC CULTURE  Status: None   Collection Time    04/05/14  1:47 PM      Result Value Ref Range   Specimen Description WOUND ANKLE LEFT     Special Requests PT ON VANCOMYCIN     Gram Stain       Value: RARE WBC PRESENT, PREDOMINANTLY MONONUCLEAR     NO SQUAMOUS EPITHELIAL CELLS SEEN     NO ORGANISMS SEEN     Performed at [facility]   Culture       Value: NO ANAEROBES ISOLATED; CULTURE IN PROGRESS FOR 5 DAYS     Performed at [facility]   Report Status PENDING    GLUCOSE, CAPILLARY     Status: Abnormal   Collection Time    04/05/14  2:09 PM      Result Value Ref Range   Glucose-Capillary 148 (*) 70 - 99 mg/dL   Comment 1 Documented in Chart     Comment 2 Notify RN    GLUCOSE, CAPILLARY     Status: Abnormal   Collection Time    04/05/14  4:56 PM      Result Value Ref Range   Glucose-Capillary 134 (*) 70 - 99 mg/dL  GLUCOSE, CAPILLARY     Status: Abnormal   Collection Time    04/05/14  9:07 PM      Result Value Ref Range   Glucose-Capillary 196 (*) 70 - 99 mg/dL   Comment 1 Notify RN    GLUCOSE, CAPILLARY     Status: Abnormal   Collection Time    04/06/14  6:47 AM      Result Value Ref Range   Glucose-Capillary 165 (*) 70 - 99 mg/dL  VANCOMYCIN, TROUGH     Status: Abnormal   Collection Time    04/06/14  7:46 AM      Result Value Ref  Range   Vancomycin Tr 21.2 (*) 10.0 - 20.0 ug/mL  GLUCOSE, CAPILLARY     Status: Abnormal   Collection Time    04/06/14 11:59 AM      Result Value Ref Range   Glucose-Capillary 122 (*) 70 - 99 mg/dL   Comment 1 Notify RN    GLUCOSE, CAPILLARY     Status: Abnormal   Collection Time    04/06/14  4:26 PM      Result Value Ref Range   Glucose-Capillary 178 (*) 70 - 99 mg/dL   Comment 1 Notify RN    GLUCOSE, CAPILLARY     Status: Abnormal   Collection Time    04/06/14 10:18 PM      Result Value Ref Range   Glucose-Capillary 105 (*) 70 - 99 mg/dL   Comment 1 Notify RN    GLUCOSE, CAPILLARY     Status: Abnormal   Collection Time    04/07/14  7:19 AM      Result Value Ref Range   Glucose-Capillary 146 (*) 70 - 99 mg/dL      Head: Normocephalic and atraumatic.  Eyes: Pupils are equal, round, and reactive to light.  Neck: Neck supple.  Dry dressing right neck--site of prior central line. Non tender Cardiovascular: Normal rate. Normal rhythm. No murmurs  Respiratory: Effort normal and breath sounds normal. No respiratory distress. She has no wheezes.  GI: Soft. Bowel sounds are normal.  Musculoskeletal: She exhibits no edema and no tenderness. BLE with evidence of muscle atrophy. 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 Externally rotated, pes planus, Right 2nd toe  amp Coban wrap LLE to patellar tendon Neurological: She is alert and oriented to person, place, and time.  . Follows basic commands without difficulty. UE: 4/5 proximal to distal. LE: 2+ HF, 3 KE, 4/5 ankle. Decreased PP/LT below knees bilaterally below knees.  Skin: Dry ulcerated areas bilateral great toes right shin with chronic vascular changes. Marland Kitchen  Psychiatric: She has a normal mood and affect. Her behavior is normal. Judgment and thought content normal.    Assessment/Plan: 1. Functional deficits secondary to deconditioning after multiple medical issues/sepsis/respiratory failure   which require 3+ hours  per day of interdisciplinary therapy in a comprehensive inpatient rehab setting. Physiatrist is providing close team supervision and 24 hour management of active medical problems listed below. Physiatrist and rehab team continue to assess barriers to discharge/monitor patient progress toward functional and medical goals.   FIM: FIM - Bathing Bathing Steps Patient Completed: Chest;Right Arm;Left Arm;Abdomen;Front perineal area;Buttocks;Right upper leg;Left upper leg;Right lower leg (including foot);Left lower leg (including foot) Bathing: 5: Set-up assist to: Obtain items  FIM - Upper Body Dressing/Undressing Upper body dressing/undressing steps patient completed: Thread/unthread right sleeve of pullover shirt/dresss;Put head through opening of pull over shirt/dress;Thread/unthread left sleeve of pullover shirt/dress;Pull shirt over trunk Upper body dressing/undressing: 5: Set-up assist to: Obtain clothing/put away FIM - Lower Body Dressing/Undressing Lower body dressing/undressing steps patient completed: Thread/unthread right underwear leg;Thread/unthread left underwear leg;Pull underwear up/down;Thread/unthread right pants leg;Thread/unthread left pants leg;Pull pants up/down;Don/Doff right sock;Don/Doff left shoe (off loading shoe in L foot) Lower body dressing/undressing: 4: Steadying Assist  FIM - Toileting Toileting steps completed by patient: Performs perineal hygiene;Adjust clothing prior to toileting Toileting Assistive Devices: Grab bar or rail for support Toileting: 3: Mod-Patient completed 2 of 3 steps  FIM - Diplomatic Services operational officer Devices: Grab bars;Walker Toilet Transfers: 3-From toilet/BSC: Mod A (lift or lower assist);3-To toilet/BSC: Mod A (lift or lower assist)  FIM - Banker Devices: Therapist, occupational: 3: Bed > Chair or W/C: Mod A (lift or lower assist);3: Chair or W/C > Bed: Mod A (lift or lower  assist)  FIM - Locomotion: Wheelchair Distance: 150 Locomotion: Wheelchair: 6: Travels 150 ft or more, turns around, maneuvers to table, bed or toilet, negotiates 3% grade: maneuvers on rugs and over door sills independently FIM - Locomotion: Ambulation Locomotion: Ambulation Assistive Devices: Designer, industrial/product Ambulation/Gait Assistance: 4: Min assist Locomotion: Ambulation: 2: Travels 50 - 149 ft with minimal assistance (Pt.>75%)  Comprehension Comprehension Mode: Auditory Comprehension: 6-Follows complex conversation/direction: With extra time/assistive device  Expression Expression Mode: Verbal Expression: 6-Expresses complex ideas: With extra time/assistive device  Social Interaction Social Interaction Mode: Asleep Social Interaction: 7-Interacts appropriately with others - No medications needed.  Problem Solving Problem Solving Mode: Asleep Problem Solving: 5-Solves basic 90% of the time/requires cueing < 10% of the time  Memory Memory Mode: Asleep Memory: 6-More than reasonable amt of time  Medical Problem List and Plan:   1. Functional deficits secondary to deconditioning after multiple medical issues/sepsis/respiratory failure   2. DVT Prophylaxis/Anticoagulation: Pharmaceutical: Lovenox   3. Chronic pain/Pain Management: continue Neurontin tid. Used fentanyl 100 mg as well as oxycodone 30 mg prn at home for chronic bilateral knee as well as neuropathic foot pain.   4. Mood: team to provide ego support. Continue Lexapro. LCSW to follow for support and evaluation.   5. Neuropsych: This patient is capable of making decisions on her own behalf.   6. Skin/Wound Care: routine pressure relief measures. Monitor  left groin daily.   7. Fluids/Electrolytes/Nutrition: Monitor I/O.   8. NSTEMI s/p PCI RCA, CAD and CFX DES: Continue ASA/Plavix. On Imdur, bisoprolol and Lipitor.   9. Acute systolic heart failure: Monitor daily weights and for other signs of overload. Continue  Zebeta, lasix 40 mg/day, Cozaar 25 mg/day and aldactone 12. $RemoveBefor'5mg'[id number]$  /day. Monitor for tolerance of medications.   10 DM type 2 with neuropathy: Hgb A1c- 7.2. Will monitor BS with ac/hs checks. Continue lantus insulin 20 units daily. SSI for elevated BS.   11. Left ant ankle cyst start doxy, ID rec no abx will d/c  12. ABLA due to left groin hematoma: H/H improving. Will monitor for stability. Recheck in am.   13. Dysphagia: Diet advanced to Dysphagia 3 texturesand should help po intake. Continue honey liquids and push po fluids to maintain adequate hydration.      15. Severe protein malnutrition: Albumin-2.4. Continue ensure supplements bid.       LOS (Days) 11 A FACE TO FACE EVALUATION WAS PERFORMED  KIRSTEINS,ANDREW E 04/07/2014, 9:45 AM

## 2014-04-07 NOTE — Progress Notes (Signed)
Occupational Therapy Session Note  Patient Details  Name: [name] MRN: [id number] Date of Birth: 1956/03/10  Today's Date: 04/07/2014 OT Individual Time: [id number] and [id number] OT Individual Time Calculation (min): 60 min and 30 min   Short Term Goals: Week 2:  OT Short Term Goal 1 (Week 2): Pt will perform all dressing sit to stand with modified independence. OT Short Term Goal 2 (Week 2): Pt will peform toilet transfer with modified indepedence using the RW. OT Short Term Goal 3 (Week 2): Pt will perform tub/shower transfers with supervision using the RW. OT Short Term Goal 4 (Week 2): Pt will perform simple home management tasks with supervision using the RW for support.  Skilled Therapeutic Interventions/Progress Updates:  Session 1 : Pt with 5/10 intermittent pain in L LE with movement. Pt seated in wheelchair upon entering the room. Pt propelled self into bathroom and set WC up for tub transfer. Pt performed WC<> TTB with Min A and use of grab bar. Pt performing all bathing tasks with supervision and utilized lateral leans to wash buttocks while seated on bench. Pt sat in WC to with RW in front of her for dressing tasks. Pt did require steady assist with LB dressing when standing with RW. Pt propelled wheelchair to sink side to perform grooming tasks with increased time. OT educated pt on energy conservation for community mobility as well as ADL tasks at home. Pt verbalized understanding. [facility] with all needed items within reach.   Session 2: Upon entering the room, pt seated on EOB with 7/10 c/o pain in L ankle this session. RN notified and arrived with medication as therapist exiting room at end of session. Pt performed B UE strengthening exercises with green, level 3 resistance theraband. Pt performed 3 sets of 10 chest pulls, shoulder diagonals,bicep curls and shoulder flexion. Pt with rest breaks secondary to fatigue. Pt seated on EOB as RN  arrived to give medication as therapist exiting the room.   Therapy Documentation Precautions:  Precautions Precautions: Fall Precaution Comments: fell 9/28 with nursing (missed EOB when sitting from stedy) Restrictions Weight Bearing Restrictions: Yes LLE Weight Bearing: Weight bearing as tolerated Other Position/Activity Restrictions: surgical shoe; elevate whenever possible  ADL: ADL ADL Comments: Refer to FIM  See FIM for current functional status  Therapy/Group: Individual Therapy  Lowella Gripittman, Tambi Thole L 04/07/2014, 12:28 PM

## 2014-04-07 NOTE — Progress Notes (Signed)
Physical Therapy Session Note  Patient Details  Name: Kaitlin RainsCheryl C Castrejon MRN: [id number] Date of Birth: [birth year]  Today's Date: 04/07/2014 PT Individual Time: 0900-1000 PT Individual Time Calculation (min): 60 min   Short Term Goals: Week 1:  PT Short Term Goal 1 (Week 1): STG=LTG due to LOS, S overall PT Short Term Goal 1 - Progress (Week 1): Progressing toward goal Week 2:  PT Short Term Goal 1 (Week 2): STG=LTG, S overall including 2 stairs with bil rails min A  Skilled Therapeutic Interventions/Progress Updates:    Patient received supine in bed, son not present. Discussion with patient about having son come in for family education/training session, will follow up with SW. Session focused on functional transfers and sit<>stand transfers. Wheelchair mobility >150' with mod I. Patient performing all stand pivot transfers bed>wheelchair<>mat with RW and min guard to close supervision. Sitting EOM, repeated sit<>stand transfers with RW with emphasis on proper set up with increasing BOS, significant anterior weight shift, and ankles posterior to knees. Patient performing all sit<>stand transfers with close supervision, intermittent cues for maintaining appropriate BOS as patient tends to have narrow BOS.  Patient provided with Dycem to use on tile surfaces due to surgical shoe slipping.  Repeated mini squats and sit<>stands in stedy with varying levels of UE support (B UE pulling up, unilateral UE pulling up, B UEs on therapist's forearms) to facilitate improved performance and efficiency with final 25% of sit>stand as well as eccentric quad control during stand>sit. Patient returned to room and left seated in wheelchair with B leg rests elevated.  Therapy Documentation Precautions:  Precautions Precautions: Fall Precaution Comments: fell 9/28 with nursing (missed EOB when sitting from stedy) Restrictions Weight Bearing Restrictions: Yes LLE Weight Bearing: Weight bearing as  tolerated Other Position/Activity Restrictions: surgical shoe; elevate whenever possible Pain: Pain Assessment Pain Assessment: 0-10 Pain Score: 5  Pain Type: Surgical pain Pain Location: Ankle Pain Orientation: Left Pain Descriptors / Indicators: Throbbing Pain Onset: On-going Pain Intervention(s): RN made aware;Repositioned;Ambulation/increased activity Multiple Pain Sites: No Locomotion : Ambulation Ambulation/Gait Assistance: Not tested (comment) Wheelchair Mobility Distance: 150   See FIM for current functional status  Therapy/Group: Individual Therapy  Chipper HerbBridget S Zykira Matlack S. Kadence Mimbs, PT, DPT 04/07/2014, 12:21 PM

## 2014-04-07 NOTE — Plan of Care (Signed)
Problem: RH KNOWLEDGE DEFICIT Goal: RH STG INCREASE KNOWLEDGE OF DYSPHAGIA/FLUID INTAKE Patient and or family able to verbalize via teach-back  Outcome: Completed/Met Date Met:  04/07/14 Patient up-graded 10/16 to thin liquid

## 2014-04-07 NOTE — Progress Notes (Signed)
Occupational Therapy Session Note  Patient Details  Name: [name] MRN: [id number] Date of Birth: June 04, 1956  Today's Date: 04/07/2014 OT Individual Time: 1130-1200 OT Individual Time Calculation (min): 30 min    Short Term Goals: Week 2:  OT Short Term Goal 1 (Week 2): Pt will perform all dressing sit to stand with modified independence. OT Short Term Goal 2 (Week 2): Pt will peform toilet transfer with modified indepedence using the RW. OT Short Term Goal 3 (Week 2): Pt will perform tub/shower transfers with supervision using the RW. OT Short Term Goal 4 (Week 2): Pt will perform simple home management tasks with supervision using the RW for support.  Skilled Therapeutic Interventions/Progress Updates:    Pt seen for 1:1 OT session with focus on sit<>stand, activity tolerance, and BUE strengthening. Pt received sitting in w/c. Propelled self in w/c to ADL apartment. Engaged in reaching task overhead in standing at CGA-supervision to retrieve items from cabinet. Pt completed sit<>stand 2 sets of 5 reps at supervision level. Pt propelled self back to room and left with all needs in reach.   Therapy Documentation Precautions:  Precautions Precautions: Fall Precaution Comments: fell 9/28 with nursing (missed EOB when sitting from stedy) Restrictions Weight Bearing Restrictions: Yes LLE Weight Bearing: Weight bearing as tolerated Other Position/Activity Restrictions: surgical shoe; elevate whenever possible General:   Vital Signs:   Pain: Pt with no report of pain during therapy session.   See FIM for current functional status  Therapy/Group: Individual Therapy  Daneil Danerkinson, Neila Teem N 04/07/2014, 12:27 PM

## 2014-04-07 NOTE — Progress Notes (Signed)
Speech Language Pathology Daily Session Note  Patient Details  Name: [name] MRN: [id number] Date of Birth: 1955/06/24  Today's Date: 04/07/2014 SLP Individual Time: 0800-0830 SLP Individual Time Calculation (min): 30 min  Short Term Goals: Week 2: SLP Short Term Goal 1 (Week 2): Pt will consume presentations of her currently prescribed diet with no overt s/s of aspiration and mod I use of swallowing precautions.  SLP Short Term Goal 2 (Week 2): Pt will tolerate trials of thin liquids with meals with no overt s/s of aspiration  SLP Short Term Goal 3 (Week 2): Pt will consume water and ice chips while utilizing precautions from the water protocol with mod I.  Skilled Therapeutic Interventions:  Pt was seen for skilled speech therapy targeting dysphagia management.  Pt positioned herself to be seated at the edge of bed to consume her breakfast tray.  SLP facilitated the session with trials of thin liquids with meal to assess readiness for liquids advancement.  Pt required initial min assist faded to supervision cues to utilize swallowing precautions during trials of thin liquids.  No overt s/s of aspiration were noted with dys 3 solids or thin liquids.  Recommend advancement to thin liquids with cough following sips.  RN made aware.      FIM:  Comprehension Comprehension Mode: Auditory Comprehension: 6-Follows complex conversation/direction: With extra time/assistive device Expression Expression Mode: Verbal Expression: 6-Expresses complex ideas: With extra time/assistive device Social Interaction Social Interaction: 7-Interacts appropriately with others - No medications needed. Problem Solving Problem Solving: 5-Solves basic 90% of the time/requires cueing < 10% of the time Memory Memory: 6-More than reasonable amt of time FIM - Eating Eating Activity: 5: Supervision/cues  Pain Pain Assessment Pain Assessment: 0-10 Pain Score: 6  Pain Type: Surgical pain Pain Location:  Ankle Pain Orientation: Left Pain Descriptors / Indicators: Throbbing Pain Onset: On-going Pain Intervention(s): RN made aware Multiple Pain Sites: No  Therapy/Group: Individual Therapy  Jackalyn LombardNicole Nemiah Kissner, M.A. CCC-SLP  Ashelynn Marks, Melanee SpryNicole L 04/07/2014, 1:14 PM

## 2014-04-08 ENCOUNTER — Inpatient Hospital Stay (HOSPITAL_COMMUNITY): Payer: BC Managed Care – PPO | Admitting: *Deleted

## 2014-04-08 ENCOUNTER — Encounter (HOSPITAL_COMMUNITY): Payer: Self-pay | Admitting: Orthopedic Surgery

## 2014-04-08 ENCOUNTER — Inpatient Hospital Stay (HOSPITAL_COMMUNITY): Payer: BC Managed Care – PPO | Admitting: Physical Therapy

## 2014-04-08 DIAGNOSIS — E1169 Type 2 diabetes mellitus with other specified complication: Secondary | ICD-10-CM

## 2014-04-08 DIAGNOSIS — L089 Local infection of the skin and subcutaneous tissue, unspecified: Secondary | ICD-10-CM

## 2014-04-08 LAB — GLUCOSE, CAPILLARY
GLUCOSE-CAPILLARY: 155 mg/dL — AB (ref 70–99)
GLUCOSE-CAPILLARY: 145 mg/dL — AB (ref 70–99)
Glucose-Capillary: 139 mg/dL — ABNORMAL HIGH (ref 70–99)
Glucose-Capillary: 59 mg/dL — ABNORMAL LOW (ref 70–99)
Glucose-Capillary: 63 mg/dL — ABNORMAL LOW (ref 70–99)
Glucose-Capillary: 82 mg/dL (ref 70–99)

## 2014-04-08 NOTE — Progress Notes (Signed)
Physical Therapy Session Note  Patient Details  Name: Kaitlin RainsCheryl C [name] MRN: [id number] Date of Birth: [birth year]  Today's Date: 04/08/2014 PT Individual Time: 1300-1400 PT Individual Time Calculation (min): 60 min   Short Term Goals: Week 1:  PT Short Term Goal 1 (Week 1): STG=LTG due to LOS, S overall PT Short Term Goal 1 - Progress (Week 1): Progressing toward goal  Skilled Therapeutic Interventions/Progress Updates:  Pt was seen bedside in the pm. Pt propelled w/c to and from gym [place] for UE strengthening and generalized endurance. In gym treatment focused on LE strengthening and balance. Performed sit to stand transfers, 2 sets x 5 reps each with rolling walker. Pt performed cone taps, criss cross cone taps, and alternating cone taps, 3 sets x 10 reps each. Pt ambulated with rolling walker and min guard about 90 feet with L post op shoe.   Therapy Documentation Precautions:  Precautions Precautions: Fall Precaution Comments: fell 9/28 with nursing (missed EOB when sitting from stedy) Restrictions Weight Bearing Restrictions: Yes LLE Weight Bearing: Weight bearing as tolerated Other Position/Activity Restrictions: surgical shoe; elevate whenever possible General:   Vital Signs:   Pain: Pt c/o 5/10 pain L foot.    Locomotion : Ambulation Ambulation/Gait Assistance: 4: Min guard   See FIM for current functional status  Therapy/Group: Individual Therapy  Rayford HalstedMitchell, Orlandis Sanden G 04/08/2014, 3:03 PM

## 2014-04-08 NOTE — Progress Notes (Signed)
58 y.o. 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   Subjective/Complaints: Left ankle pain improves with movement and walking gets stiff when sitting around or laying around Review of Systems -no chest pain or shortness of breath Objective: Vital Signs: Blood pressure 127/49, pulse 65, temperature 97.6 F (36.4 C), temperature source Oral, resp. rate 17, height _0  (1.575 m), weight 78 kg (171 lb 15.3 oz), SpO2 95.00%. No results found. Results for orders placed during the hospital encounter of 03/27/14 (from the past 72 hour(s))  GLUCOSE, CAPILLARY     Status: Abnormal   Collection Time    04/05/14 11:43 AM      Result Value Ref Range   Glucose-Capillary 185 (*) 70 - 99 mg/dL  WOUND CULTURE     Status: None   Collection Time    04/05/14  1:47 PM      Result Value Ref Range   Specimen Description WOUND ANKLE LEFT     Special Requests PT ON VANCOMYCIN     Gram Stain       Value: RARE WBC PRESENT, PREDOMINANTLY MONONUCLEAR     NO SQUAMOUS EPITHELIAL CELLS SEEN     NO ORGANISMS SEEN     Performed at [facility]   Culture       Value: NO GROWTH 2 DAYS     Performed at [facility]   Report Status 04/07/2014 FINAL    ANAEROBIC CULTURE     Status: None   Collection Time    04/05/14  1:47 PM      Result Value Ref Range   Specimen Description WOUND ANKLE LEFT     Special Requests PT ON VANCOMYCIN     Gram Stain       Value: RARE WBC PRESENT, PREDOMINANTLY MONONUCLEAR     NO SQUAMOUS EPITHELIAL CELLS SEEN     NO ORGANISMS SEEN     Performed at [facility]   Culture       Value: NO ANAEROBES ISOLATED; CULTURE IN PROGRESS FOR 5 DAYS     Performed at [facility]   Report Status PENDING    GLUCOSE, CAPILLARY     Status: Abnormal   Collection Time    04/05/14  2:09 PM      Result Value Ref Range   Glucose-Capillary 148 (*) 70 - 99 mg/dL   Comment 1 Documented in Chart     Comment 2 Notify RN    GLUCOSE, CAPILLARY     Status: Abnormal   Collection Time    04/05/14  4:56 PM      Result Value Ref Range   Glucose-Capillary 134 (*) 70 - 99 mg/dL  GLUCOSE, CAPILLARY     Status: Abnormal   Collection Time    04/05/14  9:07 PM      Result Value Ref Range   Glucose-Capillary 196 (*) 70 - 99 mg/dL   Comment 1 Notify RN    GLUCOSE, CAPILLARY     Status: Abnormal   Collection Time    04/06/14  6:47 AM      Result Value Ref Range   Glucose-Capillary 165 (*) 70 - 99 mg/dL  VANCOMYCIN, TROUGH     Status: Abnormal   Collection Time    04/06/14  7:46 AM      Result Value Ref  Range   Vancomycin Tr 21.2 (*) 10.0 - 20.0 ug/mL  GLUCOSE, CAPILLARY     Status: Abnormal   Collection Time    04/06/14 11:59 AM      Result Value Ref Range   Glucose-Capillary 122 (*) 70 - 99 mg/dL   Comment 1 Notify RN    GLUCOSE, CAPILLARY     Status: Abnormal   Collection Time    04/06/14  4:26 PM      Result Value Ref Range   Glucose-Capillary 178 (*) 70 - 99 mg/dL   Comment 1 Notify RN    GLUCOSE, CAPILLARY     Status: Abnormal   Collection Time    04/06/14 10:18 PM      Result Value Ref Range   Glucose-Capillary 105 (*) 70 - 99 mg/dL   Comment 1 Notify RN    GLUCOSE, CAPILLARY     Status: Abnormal   Collection Time    04/07/14  7:19 AM      Result Value Ref Range   Glucose-Capillary 146 (*) 70 - 99 mg/dL  BASIC METABOLIC PANEL     Status: Abnormal   Collection Time    04/07/14 10:46 AM      Result Value Ref Range   Sodium 136 (*) 137 - 147 mEq/L   Potassium 4.2  3.7 - 5.3 mEq/L   Chloride 94 (*) 96 - 112 mEq/L   CO2 24  19 - 32 mEq/L   Glucose, Bld 135 (*) 70 - 99 mg/dL   BUN 12  6 - 23 mg/dL   Creatinine, Ser 0.65  0.50 - 1.10 mg/dL   Calcium 9.3  8.4 - 10.5 mg/dL   GFR calc non Af Amer >90  >90 mL/min   GFR calc Af Amer >90  >90 mL/min   Comment: (NOTE)     The eGFR has been calculated  using the CKD EPI equation.     This calculation has not been validated in all clinical situations.     eGFR's persistently <90 mL/min signify possible Chronic Kidney     Disease.   Anion gap 18 (*) 5 - 15  GLUCOSE, CAPILLARY     Status: Abnormal   Collection Time    04/07/14 11:35 AM      Result Value Ref Range   Glucose-Capillary 158 (*) 70 - 99 mg/dL  GLUCOSE, CAPILLARY     Status: Abnormal   Collection Time    04/07/14  4:14 PM      Result Value Ref Range   Glucose-Capillary 112 (*) 70 - 99 mg/dL  GLUCOSE, CAPILLARY     Status: Abnormal   Collection Time    04/07/14 10:08 PM      Result Value Ref Range   Glucose-Capillary 145 (*) 70 - 99 mg/dL  GLUCOSE, CAPILLARY     Status: Abnormal   Collection Time    04/08/14  7:04 AM      Result Value Ref Range   Glucose-Capillary 139 (*) 70 - 99 mg/dL   Comment 1 Notify RN        Head: Normocephalic and atraumatic.  Eyes: Pupils are equal, round, and reactive to light.  Neck: Neck supple.  Dry dressing right neck--site of prior central line. Non tender Cardiovascular: Normal rate. Normal rhythm. No murmurs  Respiratory: Effort normal and breath sounds normal. No respiratory distress. She has no wheezes.  GI: Soft. Bowel sounds are normal.  Musculoskeletal: She exhibits no edema and no tenderness. BLE with  evidence of muscle atrophy. 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 Externally rotated, pes planus, Right 2nd toe amp Coban wrap LLE to patellar tendon Neurological: She is alert and oriented to person, place, and time.  . Follows basic commands without difficulty. UE: 4/5 proximal to distal. LE: 2+ HF, 3 KE, 4/5 ankle. Decreased PP/LT below knees bilaterally below knees.  Skin: Dry ulcerated areas bilateral great toes right shin with chronic vascular changes. Marland Kitchen  Psychiatric: She has a normal mood and affect. Her behavior is normal. Judgment and thought content normal.    Assessment/Plan: 1. Functional deficits  secondary to deconditioning after multiple medical issues/sepsis/respiratory failure   which require 3+ hours per day of interdisciplinary therapy in a comprehensive inpatient rehab setting. Physiatrist is providing close team supervision and 24 hour management of active medical problems listed below. Physiatrist and rehab team continue to assess barriers to discharge/monitor patient progress toward functional and medical goals.   FIM: FIM - Bathing Bathing Steps Patient Completed: Chest;Right Arm;Left Arm;Abdomen;Front perineal area;Buttocks;Right upper leg;Left upper leg;Right lower leg (including foot);Left lower leg (including foot) Bathing: 5: Supervision: Safety issues/verbal cues  FIM - Upper Body Dressing/Undressing Upper body dressing/undressing steps patient completed: Thread/unthread right sleeve of pullover shirt/dresss;Thread/unthread left sleeve of pullover shirt/dress;Put head through opening of pull over shirt/dress;Pull shirt over trunk Upper body dressing/undressing: 5: Supervision: Safety issues/verbal cues FIM - Lower Body Dressing/Undressing Lower body dressing/undressing steps patient completed: Thread/unthread right underwear leg;Thread/unthread left underwear leg;Pull underwear up/down;Thread/unthread right pants leg;Thread/unthread left pants leg;Pull pants up/down;Don/Doff right sock;Don/Doff left shoe Lower body dressing/undressing: 4: Steadying Assist  FIM - Toileting Toileting steps completed by patient: Performs perineal hygiene;Adjust clothing prior to toileting Toileting Assistive Devices: Grab bar or rail for support Toileting: 0: Activity did not occur  FIM - Radio producer Devices: Grab bars;Walker Clinical cytogeneticist Transfers: 0-Activity did not occur  FIM - Engineer, site: Environmental consultant;Arm rests Bed/Chair Transfer: 5: Supine > Sit: Supervision (verbal cues/safety issues);4: Bed > Chair or W/C: Min A  (steadying Pt. > 75%);4: Chair or W/C > Bed: Min A (steadying Pt. > 75%)  FIM - Locomotion: Wheelchair Distance: 150 Locomotion: Wheelchair: 6: Travels 150 ft or more, turns around, maneuvers to table, bed or toilet, negotiates 3% grade: maneuvers on rugs and over door sills independently FIM - Locomotion: Ambulation Locomotion: Ambulation Assistive Devices: Administrator Ambulation/Gait Assistance: Not tested (comment) Locomotion: Ambulation: 0: Activity did not occur  Comprehension Comprehension Mode: Auditory Comprehension: 6-Follows complex conversation/direction: With extra time/assistive device  Expression Expression Mode: Verbal Expression: 6-Expresses complex ideas: With extra time/assistive device  Social Interaction Social Interaction Mode: Asleep Social Interaction: 7-Interacts appropriately with others - No medications needed.  Problem Solving Problem Solving Mode: Asleep Problem Solving: 5-Solves basic 90% of the time/requires cueing < 10% of the time  Memory Memory Mode: Asleep Memory: 6-More than reasonable amt of time  Medical Problem List and Plan:   1. Functional deficits secondary to deconditioning after multiple medical issues/sepsis/respiratory failure   2. DVT Prophylaxis/Anticoagulation: Pharmaceutical: Lovenox   3. Chronic pain/Pain Management: continue Neurontin tid. Used fentanyl 100 mg as well as oxycodone 30 mg prn at home for chronic bilateral knee as well as neuropathic foot pain.   4. Mood: team to provide ego support. Continue Lexapro. LCSW to follow for support and evaluation.   5. Neuropsych: This patient is capable of making decisions on her own behalf.   6. Skin/Wound Care: routine pressure relief measures. Monitor  left groin daily.   7. Fluids/Electrolytes/Nutrition: Monitor I/O.   8. NSTEMI s/p PCI RCA, CAD and CFX DES: Continue ASA/Plavix. On Imdur, bisoprolol and Lipitor.   9. Acute systolic heart failure: Monitor daily weights and for  other signs of overload. Continue Zebeta, lasix 40 mg/day, Cozaar 25 mg/day and aldactone 12. 62m /day. Monitor for tolerance of medications.   10 DM type 2 with neuropathy: Hgb A1c- 7.2. Will monitor BS with ac/hs checks. Continue lantus insulin 20 units daily. SSI for elevated BS.   11. Left ant ankle postoperative day #2 status post hardware removal and drainage 12. ABLA due to left groin hematoma: H/H improving. Will monitor for stability. Recheck in am.   13. Dysphagia: Diet advanced to Dysphagia 3 texturesand should help po intake. Continue honey liquids and push po fluids to maintain adequate hydration.      15. Severe protein malnutrition: Albumin-2.4. Continue ensure supplements bid.       LOS (Days) 12 A FACE TO FACE EVALUATION WAS PERFORMED  Omari Mcmanaway E 04/08/2014, 11:06 AM

## 2014-04-08 NOTE — Progress Notes (Addendum)
Occupational Therapy Session Note  Patient Details  Name: [name] MRN: [id number] Date of Birth: 1955/06/29  Today's Date: 04/08/2014 OT Individual Time:  - 1430-1500  (30 min)      Short Term Goals: Week 1:  OT Short Term Goal 1 (Week 1): Pt will complete a stand pivot transfer from w/c to toilet with supervision. OT Short Term Goal 1 - Progress (Week 1): Met OT Short Term Goal 2 (Week 1): Pt will be able to walk 15 feet into bathroom with RW with min A. OT Short Term Goal 2 - Progress (Week 1): Met OT Short Term Goal 3 (Week 1): Pt will be able to bathe with supervision. OT Short Term Goal 3 - Progress (Week 1): Met OT Short Term Goal 4 (Week 1): Pt will be able to don her shoes with supervision. OT Short Term Goal 4 - Progress (Week 1): Met OT Short Term Goal 5 (Week 1): Pt will be able to make her bed using RW for support with min A. OT Short Term Goal 5 - Progress (Week 1): Met Week 2:  OT Short Term Goal 1 (Week 2): Pt will perform all dressing sit to stand with modified independence. OT Short Term Goal 2 (Week 2): Pt will peform toilet transfer with modified indepedence using the RW. OT Short Term Goal 3 (Week 2): Pt will perform tub/shower transfers with supervision using the RW. OT Short Term Goal 4 (Week 2): Pt will perform simple home management tasks with supervision using the RW for support.  Skilled Therapeutic Interventions/Progress Updates:    Addressed functional mobility using RW while watering plants.  Pt. Ambulated with min to SBA during activity for 20 minutes with 3 sitting rest breaks.  She maintained static standing balance with no LOB.  Pt. Ambulated from room to dayroom with min assist.  Taken back to to room and left in wc with all needs in reach.    Therapy Documentation Precautions:  Precautions Precautions: Fall Precaution Comments: fell 9/28 with nursing (missed EOB when sitting from stedy) Restrictions Weight Bearing Restrictions: Yes LLE  Weight Bearing: Weight bearing as tolerated Other Position/Activity Restrictions: surgical shoe; elevate whenever possible General:   Vital Signs: Therapy Vitals Temp: 98.5 F (36.9 C) Temp Source: Oral Pulse Rate: 66 Resp: 18 BP: 110/47 mmHg Patient Position (if appropriate): Lying Oxygen Therapy SpO2: 97 % O2 Device: None (Room air) Pain: Pain Assessment Pain Assessment: 0-10 Pain Score: 6/10  Faces Pain Scale: No hurt Pain Type: Surgical pain Pain Location: Ankle Pain Orientation: Left Pain Descriptors / Indicators: Throbbing Pain Onset: Gradual Pain Intervention(s): Medication (See eMAR) PAINAD (Pain Assessment in Advanced Dementia) Breathing: normal ADL: ADL ADL Comments: Refer to FIM       See FIM for current functional status  Therapy/Group: Individual Therapy  Lisa Roca 04/08/2014, 7:33 PM

## 2014-04-09 ENCOUNTER — Inpatient Hospital Stay (HOSPITAL_COMMUNITY): Payer: BC Managed Care – PPO | Admitting: *Deleted

## 2014-04-09 DIAGNOSIS — E1142 Type 2 diabetes mellitus with diabetic polyneuropathy: Secondary | ICD-10-CM

## 2014-04-09 LAB — VANCOMYCIN, TROUGH: VANCOMYCIN TR: 17.9 ug/mL (ref 10.0–20.0)

## 2014-04-09 LAB — GLUCOSE, CAPILLARY
GLUCOSE-CAPILLARY: 122 mg/dL — AB (ref 70–99)
GLUCOSE-CAPILLARY: 116 mg/dL — AB (ref 70–99)
Glucose-Capillary: 191 mg/dL — ABNORMAL HIGH (ref 70–99)
Glucose-Capillary: 126 mg/dL — ABNORMAL HIGH (ref 70–99)

## 2014-04-09 MED ORDER — GABAPENTIN 400 MG PO CAPS
400.0000 mg | ORAL_CAPSULE | Freq: Three times a day (TID) | ORAL | Status: DC
Start: 2014-04-09 — End: 2014-04-11
  Administered 2014-04-09 – 2014-04-11 (×6): 400 mg via ORAL
  Filled 2014-04-09 (×10): qty 1

## 2014-04-09 MED ORDER — SODIUM CHLORIDE 0.9 % IV SOLN
1500.0000 mg | INTRAVENOUS | Status: DC
  Administered 2014-04-10 – 2014-04-11 (×2): 1500 mg via INTRAVENOUS
  Filled 2014-04-09 (×2): qty 1500

## 2014-04-09 NOTE — Progress Notes (Signed)
58 y.o. 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   Subjective/Complaints: Left ankle pain improves with movement and walking gets stiff when sitting around or laying around No bowel or bladder issues Review of Systems -no chest pain or shortness of breath Objective: Vital Signs: Blood pressure 122/44, pulse 73, temperature 97.4 F (36.3 C), temperature source Oral, resp. rate 17, height _0  (1.575 m), weight 79.5 kg (175 lb 4.3 oz), SpO2 97.00%. No results found. Results for orders placed during the hospital encounter of 03/27/14 (from the past 72 hour(s))  GLUCOSE, CAPILLARY     Status: Abnormal   Collection Time    04/06/14 11:59 AM      Result Value Ref Range   Glucose-Capillary 122 (*) 70 - 99 mg/dL   Comment 1 Notify RN    GLUCOSE, CAPILLARY     Status: Abnormal   Collection Time    04/06/14  4:26 PM      Result Value Ref Range   Glucose-Capillary 178 (*) 70 - 99 mg/dL   Comment 1 Notify RN    GLUCOSE, CAPILLARY     Status: Abnormal   Collection Time    04/06/14 10:18 PM      Result Value Ref Range   Glucose-Capillary 105 (*) 70 - 99 mg/dL   Comment 1 Notify RN    GLUCOSE, CAPILLARY     Status: Abnormal   Collection Time    04/07/14  7:19 AM      Result Value Ref Range   Glucose-Capillary 146 (*) 70 - 99 mg/dL  BASIC METABOLIC PANEL     Status: Abnormal   Collection Time    04/07/14 10:46 AM      Result Value Ref Range   Sodium 136 (*) 137 - 147 mEq/L   Potassium 4.2  3.7 - 5.3 mEq/L   Chloride 94 (*) 96 - 112 mEq/L   CO2 24  19 - 32 mEq/L   Glucose, Bld 135 (*) 70 - 99 mg/dL   BUN 12  6 - 23 mg/dL   Creatinine, Ser 0.65  0.50 - 1.10 mg/dL   Calcium 9.3  8.4 - 10.5 mg/dL   GFR calc non Af Amer >90  >90 mL/min   GFR calc Af Amer >90  >90 mL/min   Comment: (NOTE)     The eGFR has been calculated using the CKD EPI  equation.     This calculation has not been validated in all clinical situations.     eGFR's persistently <90 mL/min signify possible Chronic Kidney     Disease.   Anion gap 18 (*) 5 - 15  GLUCOSE, CAPILLARY     Status: Abnormal   Collection Time    04/07/14 11:35 AM      Result Value Ref Range   Glucose-Capillary 158 (*) 70 - 99 mg/dL  GLUCOSE, CAPILLARY     Status: Abnormal   Collection Time    04/07/14  4:14 PM      Result Value Ref Range   Glucose-Capillary 112 (*) 70 - 99 mg/dL  GLUCOSE, CAPILLARY     Status: Abnormal   Collection Time    04/07/14 10:08 PM      Result Value Ref Range   Glucose-Capillary 145 (*) 70 - 99 mg/dL  GLUCOSE, CAPILLARY     Status: Abnormal  Collection Time    04/08/14  7:04 AM      Result Value Ref Range   Glucose-Capillary 139 (*) 70 - 99 mg/dL   Comment 1 Notify RN    GLUCOSE, CAPILLARY     Status: Abnormal   Collection Time    04/08/14 11:56 AM      Result Value Ref Range   Glucose-Capillary 155 (*) 70 - 99 mg/dL  GLUCOSE, CAPILLARY     Status: Abnormal   Collection Time    04/08/14  5:13 PM      Result Value Ref Range   Glucose-Capillary 59 (*) 70 - 99 mg/dL  GLUCOSE, CAPILLARY     Status: Abnormal   Collection Time    04/08/14  5:39 PM      Result Value Ref Range   Glucose-Capillary 63 (*) 70 - 99 mg/dL  GLUCOSE, CAPILLARY     Status: None   Collection Time    04/08/14  5:56 PM      Result Value Ref Range   Glucose-Capillary 82  70 - 99 mg/dL  GLUCOSE, CAPILLARY     Status: Abnormal   Collection Time    04/08/14  9:38 PM      Result Value Ref Range   Glucose-Capillary 145 (*) 70 - 99 mg/dL  GLUCOSE, CAPILLARY     Status: Abnormal   Collection Time    04/09/14  6:51 AM      Result Value Ref Range   Glucose-Capillary 122 (*) 70 - 99 mg/dL   Comment 1 Notify RN        Head: Normocephalic and atraumatic.  Eyes: Pupils are equal, round, and reactive to light.  Neck: Neck supple.  Dry dressing right neck--site of prior  central line. Non tender Cardiovascular: Normal rate. Normal rhythm. No murmurs  Respiratory: Effort normal and breath sounds normal. No respiratory distress. She has no wheezes.  GI: Soft. Bowel sounds are normal.  Musculoskeletal: She exhibits no edema and no tenderness. BLE with evidence of muscle atrophy. 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 Externally rotated, pes planus, Right 2nd toe amp Coban wrap LLE to patellar tendon Neurological: She is alert and oriented to person, place, and time.  . Follows basic commands without difficulty. UE: 4/5 proximal to distal. LE: 2+ HF, 3 KE, 4/5 ankle. Decreased PP/LT below knees bilaterally below knees.  Skin: Dry ulcerated areas bilateral great toes right shin with chronic vascular changes. Marland Kitchen  Psychiatric: She has a normal mood and affect. Her behavior is normal. Judgment and thought content normal.    Assessment/Plan: 1. Functional deficits secondary to deconditioning after multiple medical issues/sepsis/respiratory failure   which require 3+ hours per day of interdisciplinary therapy in a comprehensive inpatient rehab setting. Physiatrist is providing close team supervision and 24 hour management of active medical problems listed below. Physiatrist and rehab team continue to assess barriers to discharge/monitor patient progress toward functional and medical goals.   FIM: FIM - Bathing Bathing Steps Patient Completed: Chest;Right Arm;Left Arm;Abdomen;Front perineal area;Buttocks;Right upper leg;Left upper leg;Right lower leg (including foot);Left lower leg (including foot) Bathing: 5: Supervision: Safety issues/verbal cues  FIM - Upper Body Dressing/Undressing Upper body dressing/undressing steps patient completed: Thread/unthread right sleeve of pullover shirt/dresss;Thread/unthread left sleeve of pullover shirt/dress;Put head through opening of pull over shirt/dress;Pull shirt over trunk Upper body dressing/undressing: 5:  Supervision: Safety issues/verbal cues FIM - Lower Body Dressing/Undressing Lower body dressing/undressing steps patient completed: Thread/unthread right underwear leg;Thread/unthread left underwear leg;Pull underwear  up/down;Thread/unthread right pants leg;Thread/unthread left pants leg;Pull pants up/down;Don/Doff right sock;Don/Doff left shoe Lower body dressing/undressing: 4: Steadying Assist  FIM - Toileting Toileting steps completed by patient: Performs perineal hygiene;Adjust clothing prior to toileting Toileting Assistive Devices: Grab bar or rail for support Toileting: 0: Activity did not occur  FIM - Radio producer Devices: Grab bars;Walker Clinical cytogeneticist Transfers: 0-Activity did not occur  FIM - Engineer, site: Environmental consultant;Arm rests Bed/Chair Transfer: 5: Supine > Sit: Supervision (verbal cues/safety issues);4: Bed > Chair or W/C: Min A (steadying Pt. > 75%);4: Chair or W/C > Bed: Min A (steadying Pt. > 75%)  FIM - Locomotion: Wheelchair Distance: 150 Locomotion: Wheelchair: 6: Travels 150 ft or more, turns around, maneuvers to table, bed or toilet, negotiates 3% grade: maneuvers on rugs and over door sills independently FIM - Locomotion: Ambulation Locomotion: Ambulation Assistive Devices: Administrator Ambulation/Gait Assistance: 4: Min guard Locomotion: Ambulation: 2: Travels 50 - 149 ft with minimal assistance (Pt.>75%)  Comprehension Comprehension Mode: Auditory Comprehension: 6-Follows complex conversation/direction: With extra time/assistive device  Expression Expression Mode: Verbal Expression: 6-Expresses complex ideas: With extra time/assistive device  Social Interaction Social Interaction Mode: Asleep Social Interaction: 7-Interacts appropriately with others - No medications needed.  Problem Solving Problem Solving Mode: Asleep Problem Solving: 5-Solves basic 90% of the time/requires cueing < 10%  of the time  Memory Memory Mode: Asleep Memory: 6-More than reasonable amt of time  Medical Problem List and Plan:   1. Functional deficits secondary to deconditioning after multiple medical issues/sepsis/respiratory failure   2. DVT Prophylaxis/Anticoagulation: Pharmaceutical: Lovenox   3. Chronic pain/Pain Management: continue Neurontin tid. Used fentanyl 100 mg as well as oxycodone 30 mg prn at home for chronic bilateral knee as well as neuropathic foot pain.  will increase neurontin dose, currently on Oxycodone 60m Q4h prn 4. Mood: team to provide ego support. Continue Lexapro. LCSW to follow for support and evaluation.   5. Neuropsych: This patient is capable of making decisions on her own behalf.   6. Skin/Wound Care: routine pressure relief measures. Monitor left groin daily.   7. Fluids/Electrolytes/Nutrition: Monitor I/O.   8. NSTEMI s/p PCI RCA, CAD and CFX DES: Continue ASA/Plavix. On Imdur, bisoprolol and Lipitor.   9. Acute systolic heart failure: Monitor daily weights and for other signs of overload. Continue Zebeta, lasix 40 mg/day, Cozaar 25 mg/day and aldactone 12. 545m/day. Monitor for tolerance of medications.   10 DM type 2 with neuropathy: Hgb A1c- 7.2. Will monitor BS with ac/hs checks. Continue lantus insulin 20 units daily. SSI for elevated BS.   11. Left ant ankle  status post hardware removal and drainage 12. ABLA due to left groin hematoma: H/H improving. Will monitor for stability. Recheck in am.   13. Dysphagia: Diet advanced to Dysphagia 3 texturesand should help po intake. Continue honey liquids and push po fluids to maintain adequate hydration.      15. Severe protein malnutrition: Albumin-2.4. Continue ensure supplements bid.       LOS (Days) 13 A FACE TO FACE EVALUATION WAS PERFORMED  Janay Canan E 04/09/2014, 10:26 AM

## 2014-04-09 NOTE — Progress Notes (Signed)
ANTIBIOTIC CONSULT NOTE - Follow up Pharmacy Consult for Vancomycin  Indication: left ankle abscess  Allergies  Allergen Reactions  . Penicillins Hives    Tolerates cefepime  . Adhesive [Tape] Rash    Patient Measurements: Height: 5\' 2"  (157.5 cm) Weight: 175 lb 4.3 oz (79.5 kg) IBW/kg (Calculated) : 50.1 Adjusted Body Weight: 61 kg  Vital Signs: Temp: 97.4 F (36.3 C) (10/18 0504) Temp Source: Oral (10/18 0504) BP: 122/44 mmHg (10/18 0504) Pulse Rate: 73 (10/18 0504) Intake/Output from previous day:   Intake/Output from this shift: Total I/O In: 360 [P.O.:360] Out: -   Labs:  Recent Labs  04/07/14 1046  CREATININE 0.65   Estimated Creatinine Clearance: 75.8 ml/min (by C-G formula based on Cr of 0.65).  Recent Labs  04/09/14 1024  VANCOTROUGH 17.9     Assessment: Steady state vancomycin trough = 17.9 mcg/ml on current dosage of vancomycin 750 mg IV q12h.  Day#7 vancomycin  for left ankle abscess. She is afebrile, WBC WNL, . POD#4 s/p I&D left ankle, removal of deep hardware and place antibiotic beads. Ortho MD noted no signs of chronic osteomyelitis.  Vancomycin trough is above goal of 10-3415mcg/ml. MD note on 10/15 "Left ant ankle cyst start doxy, ID rec no abx will d/c".  I called Dr. Wynn BankerKirsteins about this and he wants to continue vancomycin and he will c/w ortho about long term abx plans.   10/14 left ankle wound culture NG x 2 day  Goal of Therapy:  Vancomycin trough level 10-15 mcg/ml  Plan:  Change vancomycin 750mg  IV q12h to 1500 mg IV q24  Measure antibiotic drug levels at steady state Follow up culture results and renal function  Herby AbrahamMichelle T. Alyrica Thurow, Pharm.D. [phone] 04/09/2014 1:19 PM

## 2014-04-09 NOTE — Progress Notes (Signed)
Occupational Therapy Session Note  Patient Details  Name: [name] MRN: [id number] Date of Birth: [birth year]  Today's Date: 04/09/2014 OT Individual Time: 1400-1500 OT Individual Time Calculation (min): 60 min    Short Term Goals: Week 1:  OT Short Term Goal 1 (Week 1): Pt will complete a stand pivot transfer from w/c to toilet with supervision. OT Short Term Goal 1 - Progress (Week 1): Met OT Short Term Goal 2 (Week 1): Pt will be able to walk 15 feet into bathroom with RW with min A. OT Short Term Goal 2 - Progress (Week 1): Met OT Short Term Goal 3 (Week 1): Pt will be able to bathe with supervision. OT Short Term Goal 3 - Progress (Week 1): Met OT Short Term Goal 4 (Week 1): Pt will be able to don her shoes with supervision. OT Short Term Goal 4 - Progress (Week 1): Met OT Short Term Goal 5 (Week 1): Pt will be able to make her bed using RW for support with min A. OT Short Term Goal 5 - Progress (Week 1): Met Week 2:  OT Short Term Goal 1 (Week 2): Pt will perform all dressing sit to stand with modified independence. OT Short Term Goal 2 (Week 2): Pt will peform toilet transfer with modified indepedence using the RW. OT Short Term Goal 3 (Week 2): Pt will perform tub/shower transfers with supervision using the RW. OT Short Term Goal 4 (Week 2): Pt will perform simple home management tasks with supervision using the RW for support.  Skilled Therapeutic Interventions/Progress Updates:    Pt. Engaged in simple therapeutic activity in kitchen.  Focus was on RW safety, balance, energy conservation techniques.  Pt. Propelled wc to kitchen.  Engaged in kitchen task with meal prep.  Pt used RW to gather supplies with reaching and ambulating with no LOB and SBA for consistency with safety.  Pt. Stood for 2-3 minutes in static position x 5 times.  Pt. Utilized rest breaks as needed.  Completed all tasks at [place].  Pt. Propelled wc back to room and left with all needs in reach.    Therapy  Documentation Precautions:  Precautions Precautions: Fall Precaution Comments: fell 9/28 with nursing (missed EOB when sitting from stedy) Restrictions Weight Bearing Restrictions: Yes LLE Weight Bearing: Weight bearing as tolerated Other Position/Activity Restrictions: surgical shoe; elevate whenever possible       Pain: Pain Assessment Pain Assessment: 0-10 Pain Score: 8  Faces Pain Scale: Hurts a little bit Pain Type: Surgical pain Pain Location: Ankle Pain Orientation: Left Pain Descriptors / Indicators: Throbbing Pain Onset: Gradual Pain Intervention(s): Medication (See eMAR) PAINAD (Pain Assessment in Advanced Dementia) Breathing: normal ADL: ADL ADL Comments: Refer to FIM        See FIM for current functional status  Therapy/Group: Individual Therapy  Lisa Roca 04/09/2014, 7:11 PM

## 2014-04-10 ENCOUNTER — Inpatient Hospital Stay (HOSPITAL_COMMUNITY): Payer: BC Managed Care – PPO | Admitting: Physical Therapy

## 2014-04-10 ENCOUNTER — Inpatient Hospital Stay (HOSPITAL_COMMUNITY): Payer: BC Managed Care – PPO | Admitting: Occupational Therapy

## 2014-04-10 ENCOUNTER — Inpatient Hospital Stay (HOSPITAL_COMMUNITY): Payer: BC Managed Care – PPO | Admitting: *Deleted

## 2014-04-10 ENCOUNTER — Ambulatory Visit (HOSPITAL_COMMUNITY): Payer: BC Managed Care – PPO | Admitting: Speech Pathology

## 2014-04-10 DIAGNOSIS — I739 Peripheral vascular disease, unspecified: Secondary | ICD-10-CM

## 2014-04-10 DIAGNOSIS — I255 Ischemic cardiomyopathy: Secondary | ICD-10-CM

## 2014-04-10 DIAGNOSIS — E119 Type 2 diabetes mellitus without complications: Secondary | ICD-10-CM

## 2014-04-10 DIAGNOSIS — M89169 Physeal arrest, lower leg, unspecified: Secondary | ICD-10-CM

## 2014-04-10 LAB — ANAEROBIC CULTURE

## 2014-04-10 LAB — BASIC METABOLIC PANEL
Anion gap: 14 (ref 5–15)
BUN: 15 mg/dL (ref 6–23)
CO2: 27 mEq/L (ref 19–32)
CREATININE: 0.61 mg/dL (ref 0.50–1.10)
Calcium: 9.4 mg/dL (ref 8.4–10.5)
Chloride: 97 mEq/L (ref 96–112)
GFR calc Af Amer: >90 mL/min (ref >90)
GLUCOSE: 120 mg/dL — AB (ref 70–99)
POTASSIUM: 4.4 meq/L (ref 3.7–5.3)
Sodium: 138 mEq/L (ref 137–147)

## 2014-04-10 LAB — GLUCOSE, CAPILLARY
GLUCOSE-CAPILLARY: 116 mg/dL — AB (ref 70–99)
Glucose-Capillary: 131 mg/dL — ABNORMAL HIGH (ref 70–99)
Glucose-Capillary: 228 mg/dL — ABNORMAL HIGH (ref 70–99)
Glucose-Capillary: 72 mg/dL (ref 70–99)

## 2014-04-10 MED ORDER — OXYCODONE HCL ER 10 MG PO T12A
10.0000 mg | EXTENDED_RELEASE_TABLET | Freq: Every day | ORAL | Status: DC
  Administered 2014-04-10: 10 mg via ORAL
  Filled 2014-04-10: qty 1

## 2014-04-10 MED ORDER — OXYCODONE HCL 5 MG PO TABS
10.0000 mg | ORAL_TABLET | ORAL | Status: DC | PRN
  Administered 2014-04-10 – 2014-04-11 (×4): 10 mg via ORAL
  Filled 2014-04-10 (×4): qty 2

## 2014-04-10 NOTE — Progress Notes (Signed)
Social Work Patient ID: [name], female   DOB: 1955-12-15, 58 y.o.   MRN: [id number] Met with pt to discuss discharge needs, her only equipment need is a rental wheelchair, she is agreeable and prefer to use [facility]. Have also made referral for follow up therapies.  Son to be here for education today to prepare for discharge tomorrow.

## 2014-04-10 NOTE — Progress Notes (Addendum)
58 y.o. 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   Subjective/Complaints: Left ankle pain wakes her up q3 h last noc, had been on long acting narcotic at home No bowel or bladder issues Review of Systems -no chest pain or shortness of breath Objective: Vital Signs: Blood pressure 96/52, pulse 82, temperature 97.8 F (36.6 C), temperature source Oral, resp. rate 16, height $RemoveBe'5\' 2"'NviJSoNEH$  (1.575 m), weight 83.9 kg (184 lb 15.5 oz), SpO2 97.00%. No results found. Results for orders placed during the hospital encounter of 03/27/14 (from the past 72 hour(s))  BASIC METABOLIC PANEL     Status: Abnormal   Collection Time    04/07/14 10:46 AM      Result Value Ref Range   Sodium 136 (*) 137 - 147 mEq/L   Potassium 4.2  3.7 - 5.3 mEq/L   Chloride 94 (*) 96 - 112 mEq/L   CO2 24  19 - 32 mEq/L   Glucose, Bld 135 (*) 70 - 99 mg/dL   BUN 12  6 - 23 mg/dL   Creatinine, Ser 0.65  0.50 - 1.10 mg/dL   Calcium 9.3  8.4 - 10.5 mg/dL   GFR calc non Af Amer >90  >90 mL/min   GFR calc Af Amer >90  >90 mL/min   Comment: (NOTE)     The eGFR has been calculated using the CKD EPI equation.     This calculation has not been validated in all clinical situations.     eGFR's persistently <90 mL/min signify possible Chronic Kidney     Disease.   Anion gap 18 (*) 5 - 15  GLUCOSE, CAPILLARY     Status: Abnormal   Collection Time    04/07/14 11:35 AM      Result Value Ref Range   Glucose-Capillary 158 (*) 70 - 99 mg/dL  GLUCOSE, CAPILLARY     Status: Abnormal   Collection Time    04/07/14  4:14 PM      Result Value Ref Range   Glucose-Capillary 112 (*) 70 - 99 mg/dL  GLUCOSE, CAPILLARY     Status: Abnormal   Collection Time    04/07/14 10:08 PM      Result Value Ref Range   Glucose-Capillary 145 (*) 70 - 99 mg/dL  GLUCOSE, CAPILLARY     Status: Abnormal   Collection Time    04/08/14  7:04 AM      Result Value Ref Range   Glucose-Capillary 139 (*) 70 - 99 mg/dL   Comment 1 Notify RN    GLUCOSE, CAPILLARY     Status: Abnormal   Collection Time    04/08/14 11:56 AM      Result Value Ref Range   Glucose-Capillary 155 (*) 70 - 99 mg/dL  GLUCOSE, CAPILLARY     Status: Abnormal   Collection Time    04/08/14  5:13 PM      Result Value Ref Range   Glucose-Capillary 59 (*) 70 - 99 mg/dL  GLUCOSE, CAPILLARY     Status: Abnormal   Collection Time    04/08/14  5:39 PM      Result Value Ref Range   Glucose-Capillary 63 (*) 70 - 99 mg/dL  GLUCOSE, CAPILLARY     Status: None   Collection Time    04/08/14  5:56 PM  Result Value Ref Range   Glucose-Capillary 82  70 - 99 mg/dL  GLUCOSE, CAPILLARY     Status: Abnormal   Collection Time    04/08/14  9:38 PM      Result Value Ref Range   Glucose-Capillary 145 (*) 70 - 99 mg/dL  GLUCOSE, CAPILLARY     Status: Abnormal   Collection Time    04/09/14  6:51 AM      Result Value Ref Range   Glucose-Capillary 122 (*) 70 - 99 mg/dL   Comment 1 Notify RN    VANCOMYCIN, TROUGH     Status: None   Collection Time    04/09/14 10:24 AM      Result Value Ref Range   Vancomycin Tr 17.9  10.0 - 20.0 ug/mL  GLUCOSE, CAPILLARY     Status: Abnormal   Collection Time    04/09/14 12:13 PM      Result Value Ref Range   Glucose-Capillary 116 (*) 70 - 99 mg/dL  GLUCOSE, CAPILLARY     Status: Abnormal   Collection Time    04/09/14  4:39 PM      Result Value Ref Range   Glucose-Capillary 191 (*) 70 - 99 mg/dL  GLUCOSE, CAPILLARY     Status: Abnormal   Collection Time    04/09/14  9:49 PM      Result Value Ref Range   Glucose-Capillary 126 (*) 70 - 99 mg/dL  BASIC METABOLIC PANEL     Status: Abnormal   Collection Time    04/10/14  5:50 AM      Result Value Ref Range   Sodium 138  137 - 147 mEq/L   Potassium 4.4  3.7 - 5.3 mEq/L   Chloride 97  96 - 112 mEq/L   CO2 27  19 - 32 mEq/L   Glucose, Bld 120  (*) 70 - 99 mg/dL   BUN 15  6 - 23 mg/dL   Creatinine, Ser 0.61  0.50 - 1.10 mg/dL   Calcium 9.4  8.4 - 10.5 mg/dL   GFR calc non Af Amer >90  >90 mL/min   GFR calc Af Amer >90  >90 mL/min   Comment: (NOTE)     The eGFR has been calculated using the CKD EPI equation.     This calculation has not been validated in all clinical situations.     eGFR's persistently <90 mL/min signify possible Chronic Kidney     Disease.   Anion gap 14  5 - 15  GLUCOSE, CAPILLARY     Status: Abnormal   Collection Time    04/10/14  7:51 AM      Result Value Ref Range   Glucose-Capillary 131 (*) 70 - 99 mg/dL   Comment 1 Notify RN        Head: Normocephalic and atraumatic.  Eyes: Pupils are equal, round, and reactive to light.  Neck: Neck supple.  Dry dressing right neck--site of prior central line. Non tender Cardiovascular: Normal rate. Normal rhythm. No murmurs  Respiratory: Effort normal and breath sounds normal. No respiratory distress. She has no wheezes.  GI: Soft. Bowel sounds are normal.  Musculoskeletal: She exhibits no edema and no tenderness. BLE with evidence of muscle atrophy. 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 Externally rotated, pes planus, Right 2nd toe amp Coban wrap LLE to patellar tendon Neurological: She is alert and oriented to person, place, and time.  . Follows basic commands without difficulty. UE: 4/5 proximal to  distal. LE: 2+ HF, 3 KE, 4/5 ankle. Decreased PP/LT below knees bilaterally below knees.  Skin: Dry ulcerated areas bilateral great toes right shin with chronic vascular changes. Marland Kitchen  Psychiatric: She has a normal mood and affect. Her behavior is normal. Judgment and thought content normal.    Assessment/Plan: 1. Functional deficits secondary to deconditioning after multiple medical issues/sepsis/respiratory failure   which require 3+ hours per day of interdisciplinary therapy in a comprehensive inpatient rehab setting. Physiatrist is providing  close team supervision and 24 hour management of active medical problems listed below. Physiatrist and rehab team continue to assess barriers to discharge/monitor patient progress toward functional and medical goals.   FIM: FIM - Bathing Bathing Steps Patient Completed: Chest;Right Arm;Left Arm;Abdomen;Front perineal area;Buttocks;Right upper leg;Left upper leg;Right lower leg (including foot);Left lower leg (including foot) Bathing: 5: Supervision: Safety issues/verbal cues  FIM - Upper Body Dressing/Undressing Upper body dressing/undressing steps patient completed: Thread/unthread right sleeve of pullover shirt/dresss;Thread/unthread left sleeve of pullover shirt/dress;Put head through opening of pull over shirt/dress;Pull shirt over trunk Upper body dressing/undressing: 5: Supervision: Safety issues/verbal cues FIM - Lower Body Dressing/Undressing Lower body dressing/undressing steps patient completed: Thread/unthread right underwear leg;Thread/unthread left underwear leg;Pull underwear up/down;Thread/unthread right pants leg;Thread/unthread left pants leg;Pull pants up/down;Don/Doff right sock;Don/Doff left shoe Lower body dressing/undressing: 4: Steadying Assist  FIM - Toileting Toileting steps completed by patient: Performs perineal hygiene;Adjust clothing prior to toileting Toileting Assistive Devices: Grab bar or rail for support Toileting: 0: Activity did not occur  FIM - Radio producer Devices: Grab bars;Walker Clinical cytogeneticist Transfers: 0-Activity did not occur  FIM - Engineer, site: Environmental consultant;Arm rests Bed/Chair Transfer: 5: Supine > Sit: Supervision (verbal cues/safety issues);4: Bed > Chair or W/C: Min A (steadying Pt. > 75%);4: Chair or W/C > Bed: Min A (steadying Pt. > 75%)  FIM - Locomotion: Wheelchair Distance: 150 Locomotion: Wheelchair: 6: Travels 150 ft or more, turns around, maneuvers to table, bed or  toilet, negotiates 3% grade: maneuvers on rugs and over door sills independently FIM - Locomotion: Ambulation Locomotion: Ambulation Assistive Devices: Administrator Ambulation/Gait Assistance: 4: Min guard Locomotion: Ambulation: 2: Travels 50 - 149 ft with minimal assistance (Pt.>75%)  Comprehension Comprehension Mode: Auditory Comprehension: 6-Follows complex conversation/direction: With extra time/assistive device  Expression Expression Mode: Verbal Expression: 6-Expresses complex ideas: With extra time/assistive device  Social Interaction Social Interaction Mode: Asleep Social Interaction: 7-Interacts appropriately with others - No medications needed.  Problem Solving Problem Solving Mode: Asleep Problem Solving: 5-Solves complex 90% of the time/cues < 10% of the time  Memory Memory Mode: Asleep Memory: 6-More than reasonable amt of time  Medical Problem List and Plan:   1. Functional deficits secondary to deconditioning after multiple medical issues/sepsis/respiratory failure   2. DVT Prophylaxis/Anticoagulation: Pharmaceutical: Lovenox   3. Chronic pain/Pain Management: continue Neurontin tid. Used fentanyl 100 mg as well as oxycodone 30 mg prn at home for chronic bilateral knee as well as neuropathic foot pain.  will increase neurontin dose, currently on Oxycodone $RemoveBefo'10mg'[id number]$  Q4h prn, add hs oxycontin 4. Mood: team to provide ego support. Continue Lexapro. LCSW to follow for support and evaluation.   5. Neuropsych: This patient is capable of making decisions on her own behalf.   6. Skin/Wound Care: routine pressure relief measures. Monitor left groin daily.   7. Fluids/Electrolytes/Nutrition: Monitor I/O.   8. NSTEMI s/p PCI RCA, CAD and CFX DES: Continue ASA/Plavix. On Imdur, bisoprolol and Lipitor.   9. Acute systolic  heart failure: Monitor daily weights and for other signs of overload. Continue Zebeta, lasix 40 mg/day, Cozaar 25 mg/day and aldactone 12. $RemoveBefor'5mg'TYoiWIelLTMX$  /day. Monitor for  tolerance of medications.   10 DM type 2 with neuropathy: Hgb A1c- 7.2. Will monitor BS with ac/hs checks. Continue lantus insulin 20 units daily. SSI for elevated BS.   11. Left ant ankle  status post hardware removal and drainage, will ask ortho regarding antibiotics , ? Cont IV vanc with PICC, vs oral 12. ABLA due to left groin hematoma: H/H improving. Will monitor for stability. Recheck in am.   13. Dysphagia: Diet advanced to Dysphagia 3 textures and should help po intake. Continue thickened  liquids and push po fluids to maintain adequate hydration.  Speech to re assess    15. Severe protein malnutrition: Albumin-2.4. Continue ensure supplements bid.       LOS (Days) 14 A FACE TO FACE EVALUATION WAS PERFORMED  Ray Glacken E 04/10/2014, 8:28 AM

## 2014-04-10 NOTE — Progress Notes (Signed)
Speech Language Pathology Discharge Summary  Patient Details  Name: [name] MRN: [id number] Date of Birth: 1956/04/15  Today's Date: 04/10/2014 SLP Individual Time: 0900-0930 SLP Individual Time Calculation (min): 30 min   Skilled Therapeutic Interventions:  Pt was seen for skilled ST targeting dysphagia goals.  Upon arrival, pt was seated upright in bed, awake, alert, and agreeable to participate in [place].  Pt recalled her recommended swallowing precautions to minimize overt s/s of aspiration of thin liquids with supervision question cues.  SLP completed skilled observations during presentations of pt's currently prescribed diet.  Pt exhibited timely and efficient mastication of dys 3 solids and no overt s/s of aspiration with thin liquids.  Pt was mod I for use of immediate cough following sips of thin liquids to clear potential aspirates per most recent FEES.  Pt also reported improved pharyngeal sensation of aspirates/penetrates with hot liquids which, per report, further cued her for consistent use of swallowing precautions.  SLP recommended that pt's solids be advanced as tolerated upon discharge and reviewed/reinforced dysphagia education including s/s of aspiration.  Pt also with questions regarding return to function for her voice, which she reports is still weakened from baseline.  SLP recommended exercises to assist with vocal fold adduction and laryngeal elevations and recommended potential ENT consult if her vocal quality does not improve on its own over the next several months.      Patient has met 3 of 3 long term goals.  Patient to discharge at overall Modified Independent level.  Reasons goals not met: n/a   Clinical Impression/Discharge Summary:  Pt made functional gains while inpatient and is discharging having met 3 out of 3 long term goals due to improved strength and coordination for swallowing function.  Pt's diet has been upgraded from dys 3, honey thick liquids to dys  3, thin liquids and pt is utilizing recommended swallowing precautions with mod I.  Recommend that pt's diet be advanced as tolerated to regular solids upon discharge.  Pt education is complete and pt is discharging home with family where she will have at least intermittent supervision.  As a result, no further ST needs are indicated at this time; however, pt may benefit from an ENT consult if her post extubation vocal quality does not improve over the next several months.     Recommendation:  None      Equipment: none recommended by SLP   Reasons for discharge: Treatment goals met;Discharged from hospital   Patient/Family Agrees with Progress Made and Goals Achieved: Yes   See FIM for current functional status  Windell Moulding, M.A. CCC-SLP  Meshilem Machuca, Elmyra Ricks L 04/10/2014, 10:06 AM

## 2014-04-10 NOTE — Progress Notes (Signed)
Physical Therapy Discharge Summary  Patient Details  Name: DEVINN VOSHELL MRN: [id number] Date of Birth: 12/17/1955  Today's Date: 04/10/2014 PT Individual Time:1110-1155   45 min  Patient has met 8 of 8 long term goals due to improved activity tolerance, improved balance, improved postural control, increased strength and decreased pain.  Patient to discharge at an ambulatory level Supervision.   Patient's care partner is independent to provide the necessary cognitive assistance at discharge. Pt made excellent progress during stay at rehab, and will continue to do so in follow-up settings. All family training complete, and pt/family have no further questions at DC.    Recommendation:  Patient will benefit from ongoing skilled PT services in home health setting to continue to advance safe functional mobility, address ongoing impairments in activity tolerance, LE and trunk weakness, pain, cardiorespiratory endurance, and minimize fall risk.  Equipment: RW  Reasons for discharge: treatment goals met and discharge from hospital  Patient/family agrees with progress made and goals achieved: Yes  Skilled Interventions: Session today (04/10/14) focused on family education with functional mobility, emphasis on functional ambulation and stairs. See details below. Discussion/education with patient and son about discharge recommendations of overall supervision with functional mobility and minA for stair negotiation. Patient and son verbalized understanding and in agreement with recommendations.  PT Discharge Precautions/Restrictions Precautions Precautions: Fall Precaution Comments: fell 9/28 with nursing (missed EOB when sitting from stedy) Restrictions Weight Bearing Restrictions: Yes LLE Weight Bearing: Weight bearing as tolerated Other Position/Activity Restrictions: surgical shoe; elevate whenever possible Pain Pain Assessment Pain Assessment: No/denies pain Pain Score: 0-No  pain Vision/Perception     Cognition Overall Cognitive Status: Within Functional Limits for tasks assessed Sensation Sensation Light Touch: Impaired Detail Light Touch Impaired Details: Impaired RLE;Impaired LLE Stereognosis: Appears Intact Hot/Cold: Appears Intact Proprioception: Appears Intact Coordination Gross Motor Movements are Fluid and Coordinated: Yes Fine Motor Movements are Fluid and Coordinated: Yes Motor  Motor Motor: Within Functional Limits Motor - Discharge Observations: Continues to have BLE weakness, but has improved a great deal to enable her to complete self care with mod I.  Mobility Bed Mobility Bed Mobility: Supine to Sit;Sitting - Scoot to Edge of Bed;Sit to Supine Supine to Sit: 6: Modified independent (Device/Increase time);HOB flat Sitting - Scoot to Edge of Bed: 6: Modified independent (Device/Increase time) Sit to Supine: 6: Modified independent (Device/Increase time);HOB flat Transfers Transfers: Yes Sit to Stand: 6: Modified independent (Device/Increase time);With armrests;From bed;From chair/3-in-1 Stand to Sit: 6: Modified independent (Device/Increase time);With armrests;To bed;To chair/3-in-1 Stand Pivot Transfers: 6: Modified independent (Device/Increase time);Other (comment) (with rolling walker) Locomotion  Ambulation Ambulation: Yes Ambulation/Gait Assistance: 5: Supervision Ambulation Distance (Feet): 110 Feet Assistive device: Rolling walker Ambulation/Gait Assistance Details: Verbal cues for safe use of DME/AE Ambulation/Gait Assistance Details: Patient performed functional ambulation 110' x1 with RW and supervision provided by son. Gait Gait: Yes Gait Pattern: Impaired Gait Pattern: Step-through pattern;Decreased stance time - left;Decreased step length - right;Decreased weight shift to left Gait velocity: .29 m/s Stairs / Additional Locomotion Stairs: Yes Stairs Assistance: 4: Min guard (min guard provided by son) Stair  Management Technique: One rail Right;Step to pattern;Sideways Number of Stairs: 6 Height of Stairs: 6 Wheelchair Mobility Wheelchair Mobility: Yes Wheelchair Assistance: 6: Modified independent (Device/Increase time) Environmental health practitioner: Both upper extremities Wheelchair Parts Management: Independent Distance: 200  Trunk/Postural Assessment  Cervical Assessment Cervical Assessment: Exceptions to [facility] (forward head posture) Thoracic Assessment Thoracic Assessment: Within Functional Limits Lumbar Assessment Lumbar Assessment: Within Functional Limits Postural Control  Postural Control: Within Functional Limits  Balance Static Standing Balance Static Standing - Level of Assistance: 7: Independent Dynamic Standing Balance Dynamic Standing - Level of Assistance: 6: Modified independent (Device/Increase time) (using RW during self care) Extremity Assessment      RLE Assessment RLE Assessment: Exceptions to [facility] RLE Strength RLE Overall Strength Comments: Grossly : 4-/5 hip flex/abd, 4/5 hip add,  5/5 knee extension; 4+/5 knee flex, 4/5 ankle plantar/dorsiflexion LLE Assessment LLE Assessment: Exceptions to [facility] LLE Strength LLE Overall Strength: Deficits LLE Overall Strength Comments: Grossly 4/5 hip flexion and adduction, 4-/5 hip abduction; 4-/5 L knee flexion/extension; unable to assess L ankle strength due to pain with AROM (associated with recent surgery)  See FIM for current functional status  Bridget S Ripa Bridget S. Ripa, PT, DPT  Kennieth Rad, PT, DPT   04/10/2014, 10:13 PM

## 2014-04-10 NOTE — Progress Notes (Signed)
Occupational Therapy Session Note  Patient Details  Name: [name] MRN: [id number] Date of Birth: 1956/02/16  Today's Date: 04/10/2014 OT Individual Time: 1008-1110 OT Individual Time Calculation (min): 62 min    Short Term Goals: Week 1:  OT Short Term Goal 1 (Week 1): Pt will complete a stand pivot transfer from w/c to toilet with supervision. OT Short Term Goal 1 - Progress (Week 1): Met OT Short Term Goal 2 (Week 1): Pt will be able to walk 15 feet into bathroom with RW with min A. OT Short Term Goal 2 - Progress (Week 1): Met OT Short Term Goal 3 (Week 1): Pt will be able to bathe with supervision. OT Short Term Goal 3 - Progress (Week 1): Met OT Short Term Goal 4 (Week 1): Pt will be able to don her shoes with supervision. OT Short Term Goal 4 - Progress (Week 1): Met OT Short Term Goal 5 (Week 1): Pt will be able to make her bed using RW for support with min A. OT Short Term Goal 5 - Progress (Week 1): Met Week 2:  OT Short Term Goal 1 (Week 2): Pt will perform all dressing sit to stand with modified independence. OT Short Term Goal 2 (Week 2): Pt will peform toilet transfer with modified indepedence using the RW. OT Short Term Goal 3 (Week 2): Pt will perform tub/shower transfers with supervision using the RW. OT Short Term Goal 4 (Week 2): Pt will perform simple home management tasks with supervision using the RW for support.      Skilled Therapeutic Interventions/Progress Updates:    Pt seen for family education with her son who will be her main caregiver. Pt was able to bathe and dress without supervision of A today. Pt demonstrated to son how she was able to walk into her bathroom with mod I using RW and safely toilet without A.  She then walked 100 feet down hallway with RW, then used w/c to go to ADL apartment. She practiced with son providing the supervision entering and exiting shower stall with ledge using RW. She also worked on bed to [facility] with mod I. Pt is  requesting a BSC due to frequent night urgency from medications.   Pt then propelled her w/c to gym to work on HEP for UEs sitting on mat.  Pt and son practiced all exercises of AROM, dowel bar, and theraband. Pt provided with HEP written handout and theraband.   Pt's next therapist arrived for her next session.  Therapy Documentation Precautions:  Precautions Precautions: Fall Precaution Comments: fell 9/28 with nursing (missed EOB when sitting from stedy) Restrictions Weight Bearing Restrictions: Yes LLE Weight Bearing: Weight bearing as tolerated Other Position/Activity Restrictions: surgical shoe; elevate whenever possible   Pain: Pain Assessment Pain Assessment: No/denies pain ADL: ADL ADL Comments: Refer to FIM  See FIM for current functional status  Therapy/Group: Individual Therapy  [place] 04/10/2014, 11:56 AM

## 2014-04-10 NOTE — Progress Notes (Signed)
Occupational Therapy Discharge Summary  Patient Details  Name: [name] MRN: [id number] Date of Birth: 1955/08/04   Patient has met 9 of 9 long term goals due to improved activity tolerance, improved balance and postural control.  Patient to discharge at overall Supervision level with home management and bathing and mod I with dressing, grooming, and toileting.  Patient's care partner is independent to provide the necessary physical assistance at discharge.    Reasons goals not met: n/a  Recommendation:  Patient will benefit from ongoing skilled OT services in home health setting to continue to advance functional skills in the area of iADL.  Equipment: Bedside commode  Reasons for discharge: treatment goals met  Patient/family agrees with progress made and goals achieved: Yes  OT Discharge ADL ADL ADL Comments: mod I with grooming, dressing, toileting; supervision for shower transfer and shower Vision/Perception  Vision- History Patient Visual Report: No change from baseline Vision- Assessment Vision Assessment?: No apparent visual deficits  Cognition Overall Cognitive Status: Within Functional Limits for tasks assessed Sensation Sensation Light Touch: Impaired Detail Light Touch Impaired Details: Impaired RLE;Impaired LLE Stereognosis: Appears Intact Hot/Cold: Appears Intact Proprioception: Appears Intact Coordination Gross Motor Movements are Fluid and Coordinated: Yes Fine Motor Movements are Fluid and Coordinated: Yes Motor  Motor Motor - Discharge Observations: Continues to have BLE weakness, but has improved a great deal to enable her to complete self care with mod I. Mobility    mod I with RW to toilet and/ or BSC, supervision to shower Trunk/Postural Assessment  Cervical Assessment Cervical Assessment: Within Functional Limits Thoracic Assessment Thoracic Assessment: Within Functional Limits Lumbar Assessment Lumbar Assessment: Within Functional  Limits Postural Control Postural Control: Within Functional Limits  Balance Static Standing Balance Static Standing - Level of Assistance: 7: Independent Dynamic Standing Balance Dynamic Standing - Level of Assistance: 6: Modified independent (Device/Increase time) (using RW during self care) Extremity/Trunk Assessment RUE Assessment RUE Assessment: Within Functional Limits LUE Assessment LUE Assessment: Within Functional Limits  See FIM for current functional status  SAGUIER,JULIA 04/10/2014, 12:08 PM

## 2014-04-10 NOTE — Progress Notes (Signed)
Physical Therapy Session Note  Patient Details  Name: [name] MRN: [id number] Date of Birth: 1956-01-30  Today's Date: 04/10/2014 PT Individual Time: 1400-1445 PT Individual Time Calculation (min): 45 min   Skilled Therapeutic Interventions/Progress Updates Pt received seated in w/c; agreeable to therapy. Session focused on assessing/addressing pt independence with functional mobility. See below for detailed findings of discharge evaluation. Pt performed w/c mobility x200' in controlled environment with bilat UE's and mod I. Performed gait x110' in controlled environment with rolling walker and supervision. In ortho gym, pt performed stand pivot transfer from w/c<>car with supervision, increased time. Session ended in pt room, where pt was left seated in w/c with all needs within reach.  PT Discharge Precautions/Restrictions Precautions Precautions: Fall Restrictions Weight Bearing Restrictions: Yes LLE Weight Bearing: Weight bearing as tolerated Other Position/Activity Restrictions: surgical shoe; elevate whenever possible Vital Signs Therapy Vitals Temp: 98.1 F (36.7 C) Temp Source: Oral Pulse Rate: 78 Resp: 17 BP: 101/52 mmHg Patient Position (if appropriate): Sitting Oxygen Therapy SpO2: 99 % O2 Device: None (Room air) Pain Pain Assessment Pain Assessment: 0-10 Pain Score: 7  Pain Type: Surgical pain Pain Location: Ankle Pain Orientation: Left Pain Descriptors / Indicators: Throbbing Pain Onset: With Activity Pain Intervention(s): RN made aware Multiple Pain Sites: No Cognition Overall Cognitive Status: Within Functional Limits for tasks assessed Sensation Sensation Light Touch: Impaired Detail Light Touch Impaired Details: Impaired RLE;Impaired LLE Stereognosis: Appears Intact Hot/Cold: Appears Intact Proprioception: Appears Intact Coordination Gross Motor Movements are Fluid and Coordinated: Yes Fine Motor Movements are Fluid and Coordinated:  Yes Motor  Motor Motor: Within Functional Limits Motor - Discharge Observations: Continues to have BLE weakness, but has improved a great deal to enable her to complete self care with mod I.  Mobility Bed Mobility Bed Mobility: Supine to Sit;Sitting - Scoot to Edge of Bed;Sit to Supine Supine to Sit: 6: Modified independent (Device/Increase time);HOB flat Sitting - Scoot to Edge of Bed: 6: Modified independent (Device/Increase time) Sit to Supine: 6: Modified independent (Device/Increase time);HOB flat Transfers Transfers: Yes Sit to Stand: 6: Modified independent (Device/Increase time);With armrests;From bed;From chair/3-in-1 Stand to Sit: 6: Modified independent (Device/Increase time);With armrests;To bed;To chair/3-in-1 Stand Pivot Transfers: 6: Modified independent (Device/Increase time);Other (comment) (with rolling walker) Locomotion  Ambulation Ambulation/Gait Assistance: 5: Supervision Wheelchair Mobility Distance: 150  Trunk/Postural Assessment  Cervical Assessment Cervical Assessment: Within Functional Limits Thoracic Assessment Thoracic Assessment: Within Functional Limits Lumbar Assessment Lumbar Assessment: Within Functional Limits Postural Control Postural Control: Within Functional Limits  Balance Static Standing Balance Static Standing - Level of Assistance: 7: Independent Dynamic Standing Balance Dynamic Standing - Level of Assistance: 6: Modified independent (Device/Increase time) (using RW during self care) Extremity Assessment  RUE Assessment RUE Assessment: Within Functional Limits LUE Assessment LUE Assessment: Within Functional Limits RLE Assessment RLE Assessment: [facility] Strength RLE Overall Strength Comments: Grossly : 4-/5 hip flex/abd, 4/5 hip add,  5/5 knee extension; 4+/5 knee flex, 4/5 ankle plantar/dorsiflexion LLE Assessment LLE Assessment: Exceptions to [facility] L[facility] Strength LLE Overall Strength: Deficits LLE Overall Strength  Comments: Grossly 4/5 hip flexion and adduction, 4-/5 hip abduction; 4-/5 L knee flexion/extension; unable to assess L ankle strength due to pain with AROM (associated with recent surgery)  See FIM for current functional status  Hobble, Lorenda IshiharaBlair A 04/10/2014, 3:57 PM

## 2014-04-11 ENCOUNTER — Telehealth (INDEPENDENT_AMBULATORY_CARE_PROVIDER_SITE_OTHER): Payer: Self-pay | Admitting: *Deleted

## 2014-04-11 ENCOUNTER — Encounter (INDEPENDENT_AMBULATORY_CARE_PROVIDER_SITE_OTHER): Payer: Self-pay | Admitting: *Deleted

## 2014-04-11 ENCOUNTER — Telehealth: Payer: Self-pay | Admitting: *Deleted

## 2014-04-11 DIAGNOSIS — E1142 Type 2 diabetes mellitus with diabetic polyneuropathy: Secondary | ICD-10-CM

## 2014-04-11 LAB — GLUCOSE, CAPILLARY: GLUCOSE-CAPILLARY: 130 mg/dL — AB (ref 70–99)

## 2014-04-11 MED ORDER — TRAMADOL HCL 50 MG PO TABS: 50.0000 mg | ORAL_TABLET | Freq: Four times a day (QID) | ORAL | Status: DC | PRN

## 2014-04-11 MED ORDER — SPIRONOLACTONE 25 MG PO TABS: 12.5000 mg | ORAL_TABLET | Freq: Every day | ORAL | Status: AC

## 2014-04-11 MED ORDER — OXYCODONE HCL 10 MG PO TABS: 10.0000 mg | ORAL_TABLET | ORAL | Status: DC | PRN

## 2014-04-11 MED ORDER — OXYCODONE HCL ER 10 MG PO T12A: 10.0000 mg | EXTENDED_RELEASE_TABLET | Freq: Every day | ORAL | Status: DC

## 2014-04-11 MED ORDER — LOSARTAN POTASSIUM 25 MG PO TABS
25.0000 mg | ORAL_TABLET | Freq: Every day | ORAL | Status: AC
Start: 2014-04-11 — End: ?

## 2014-04-11 MED ORDER — BENEPROTEIN PO POWD: 1.0000 | Freq: Three times a day (TID) | ORAL | Status: AC

## 2014-04-11 MED ORDER — METHOCARBAMOL 500 MG PO TABS: 500.0000 mg | ORAL_TABLET | Freq: Four times a day (QID) | ORAL | Status: AC | PRN

## 2014-04-11 MED ORDER — ATORVASTATIN CALCIUM 80 MG PO TABS: 80.0000 mg | ORAL_TABLET | Freq: Every day | ORAL | Status: AC

## 2014-04-11 MED ORDER — NITROGLYCERIN 0.4 MG/HR TD PT24: 0.4000 mg | MEDICATED_PATCH | TRANSDERMAL | Status: AC

## 2014-04-11 MED ORDER — DSS 100 MG PO CAPS: 100.0000 mg | ORAL_CAPSULE | Freq: Two times a day (BID) | ORAL | Status: DC

## 2014-04-11 MED ORDER — NYSTATIN 100000 UNIT/GM EX POWD: 1.0000 g | Freq: Three times a day (TID) | CUTANEOUS | Status: DC

## 2014-04-11 MED ORDER — GLIMEPIRIDE 2 MG PO TABS: 2.0000 mg | ORAL_TABLET | Freq: Two times a day (BID) | ORAL | Status: AC

## 2014-04-11 MED ORDER — ESCITALOPRAM OXALATE 10 MG PO TABS: 10.0000 mg | ORAL_TABLET | Freq: Every day | ORAL | Status: AC

## 2014-04-11 MED ORDER — GABAPENTIN 400 MG PO CAPS: 400.0000 mg | ORAL_CAPSULE | Freq: Three times a day (TID) | ORAL | Status: AC

## 2014-04-11 MED ORDER — CLOPIDOGREL BISULFATE 75 MG PO TABS: 75.0000 mg | ORAL_TABLET | Freq: Every day | ORAL | Status: AC

## 2014-04-11 MED ORDER — NYSTATIN 100000 UNIT/GM EX CREA: TOPICAL_CREAM | Freq: Two times a day (BID) | CUTANEOUS | Status: DC

## 2014-04-11 MED ORDER — METFORMIN HCL 1000 MG PO TABS: 1000.0000 mg | ORAL_TABLET | Freq: Two times a day (BID) | ORAL | Status: AC

## 2014-04-11 MED ORDER — FUROSEMIDE 40 MG PO TABS: 40.0000 mg | ORAL_TABLET | Freq: Every day | ORAL | Status: AC

## 2014-04-11 MED ORDER — MUSCLE RUB 10-15 % EX CREA: 1.0000 "application " | TOPICAL_CREAM | Freq: Three times a day (TID) | CUTANEOUS | Status: DC

## 2014-04-11 MED ORDER — BISOPROLOL FUMARATE 5 MG PO TABS: 5.0000 mg | ORAL_TABLET | Freq: Every day | ORAL | Status: AC

## 2014-04-11 NOTE — Progress Notes (Signed)
58 y.o. 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   Subjective/Complaints: Slept better last noc Appreciate ortho note No bowel or bladder issues Review of Systems -no chest pain or shortness of breath Objective: Vital Signs: Blood pressure 125/59, pulse 67, temperature 97.8 F (36.6 C), temperature source Oral, resp. rate 18, height $RemoveBe'5\' 2"'eioEkuYdH$  (1.575 m), weight 81.4 kg (179 lb 7.3 oz), SpO2 97.00%. No results found. Results for orders placed during the hospital encounter of 03/27/14 (from the past 72 hour(s))  GLUCOSE, CAPILLARY     Status: Abnormal   Collection Time    04/08/14 11:56 AM      Result Value Ref Range   Glucose-Capillary 155 (*) 70 - 99 mg/dL  GLUCOSE, CAPILLARY     Status: Abnormal   Collection Time    04/08/14  5:13 PM      Result Value Ref Range   Glucose-Capillary 59 (*) 70 - 99 mg/dL  GLUCOSE, CAPILLARY     Status: Abnormal   Collection Time    04/08/14  5:39 PM      Result Value Ref Range   Glucose-Capillary 63 (*) 70 - 99 mg/dL  GLUCOSE, CAPILLARY     Status: None   Collection Time    04/08/14  5:56 PM      Result Value Ref Range   Glucose-Capillary 82  70 - 99 mg/dL  GLUCOSE, CAPILLARY     Status: Abnormal   Collection Time    04/08/14  9:38 PM      Result Value Ref Range   Glucose-Capillary 145 (*) 70 - 99 mg/dL  GLUCOSE, CAPILLARY     Status: Abnormal   Collection Time    04/09/14  6:51 AM      Result Value Ref Range   Glucose-Capillary 122 (*) 70 - 99 mg/dL   Comment 1 Notify RN    VANCOMYCIN, TROUGH     Status: None   Collection Time    04/09/14 10:24 AM      Result Value Ref Range   Vancomycin Tr 17.9  10.0 - 20.0 ug/mL  GLUCOSE, CAPILLARY     Status: Abnormal   Collection Time    04/09/14 12:13 PM      Result Value Ref Range   Glucose-Capillary 116 (*) 70 - 99 mg/dL  GLUCOSE, CAPILLARY      Status: Abnormal   Collection Time    04/09/14  4:39 PM      Result Value Ref Range   Glucose-Capillary 191 (*) 70 - 99 mg/dL  GLUCOSE, CAPILLARY     Status: Abnormal   Collection Time    04/09/14  9:49 PM      Result Value Ref Range   Glucose-Capillary 126 (*) 70 - 99 mg/dL  BASIC METABOLIC PANEL     Status: Abnormal   Collection Time    04/10/14  5:50 AM      Result Value Ref Range   Sodium 138  137 - 147 mEq/L   Potassium 4.4  3.7 - 5.3 mEq/L   Chloride 97  96 - 112 mEq/L   CO2 27  19 - 32 mEq/L   Glucose, Bld 120 (*) 70 - 99 mg/dL   BUN 15  6 - 23 mg/dL   Creatinine, Ser 0.61  0.50 - 1.10 mg/dL   Calcium 9.4  8.4 -  10.5 mg/dL   GFR calc non Af Amer >90  >90 mL/min   GFR calc Af Amer >90  >90 mL/min   Comment: (NOTE)     The eGFR has been calculated using the CKD EPI equation.     This calculation has not been validated in all clinical situations.     eGFR's persistently <90 mL/min signify possible Chronic Kidney     Disease.   Anion gap 14  5 - 15  GLUCOSE, CAPILLARY     Status: Abnormal   Collection Time    04/10/14  7:51 AM      Result Value Ref Range   Glucose-Capillary 131 (*) 70 - 99 mg/dL   Comment 1 Notify RN    GLUCOSE, CAPILLARY     Status: Abnormal   Collection Time    04/10/14 11:40 AM      Result Value Ref Range   Glucose-Capillary 228 (*) 70 - 99 mg/dL   Comment 1 Notify RN    GLUCOSE, CAPILLARY     Status: None   Collection Time    04/10/14  4:38 PM      Result Value Ref Range   Glucose-Capillary 72  70 - 99 mg/dL   Comment 1 Notify RN    GLUCOSE, CAPILLARY     Status: Abnormal   Collection Time    04/10/14  8:59 PM      Result Value Ref Range   Glucose-Capillary 116 (*) 70 - 99 mg/dL  GLUCOSE, CAPILLARY     Status: Abnormal   Collection Time    04/11/14  7:20 AM      Result Value Ref Range   Glucose-Capillary 130 (*) 70 - 99 mg/dL      Head: Normocephalic and atraumatic.  Eyes: Pupils are equal, round, and reactive to light.  Neck:  Neck supple.  Dry dressing right neck--site of prior central line. Non tender Cardiovascular: Normal rate. Normal rhythm. No murmurs  Respiratory: Effort normal and breath sounds normal. No respiratory distress. She has no wheezes.  GI: Soft. Bowel sounds are normal.  Musculoskeletal: She exhibits no edema and no tenderness. BLE with evidence of muscle atrophy. 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 Externally rotated, pes planus, Right 2nd toe amp Coban wrap LLE to patellar tendon Neurological: She is alert and oriented to person, place, and time.  . Follows basic commands without difficulty. UE: 4/5 proximal to distal. LE: 2+ HF, 3 KE, 4/5 ankle. Decreased PP/LT below knees bilaterally below knees.  Skin: Dry ulcerated areas bilateral great toes right shin with chronic vascular changes. .     Assessment/Plan: 1. Functional deficits secondary to deconditioning after multiple medical issues/sepsis/respiratory failure    Stable for D/C today F/u PCP in 1-2 weeks F/u Ortho 3 d See D/C summary See D/C instructions FIM: FIM - Bathing Bathing Steps Patient Completed: Chest;Right Arm;Left Arm;Abdomen;Front perineal area;Buttocks;Right upper leg;Left upper leg;Right lower leg (including foot);Left lower leg (including foot) Bathing: 5: Supervision: Safety issues/verbal cues  FIM - Upper Body Dressing/Undressing Upper body dressing/undressing steps patient completed: Thread/unthread right sleeve of pullover shirt/dresss;Thread/unthread left sleeve of pullover shirt/dress;Put head through opening of pull over shirt/dress;Pull shirt over trunk Upper body dressing/undressing: 6: More than reasonable amount of time FIM - Lower Body Dressing/Undressing Lower body dressing/undressing steps patient completed: Thread/unthread right underwear leg;Thread/unthread left underwear leg;Pull underwear up/down;Thread/unthread right pants leg;Thread/unthread left pants leg;Pull pants  up/down;Don/Doff right sock;Don/Doff left shoe;Don/Doff right shoe;Fasten/unfasten right shoe;Fasten/unfasten left shoe Lower body  dressing/undressing: 7: Complete Independence: No helper  FIM - Toileting Toileting steps completed by patient: Performs perineal hygiene;Adjust clothing prior to toileting;Adjust clothing after toileting Toileting Assistive Devices: Grab bar or rail for support Toileting: 6: More than reasonable amount of time  FIM - Radio producer Devices: Grab bars;Walker;Elevated toilet seat Toilet Transfers: 6-More than reasonable amt of time  FIM - Control and instrumentation engineer Devices: Environmental consultant;Arm rests Bed/Chair Transfer: 6: Bed > Chair or W/C: No assist;6: Chair or W/C > Bed: No assist;6: Assistive device: no helper  FIM - Locomotion: Wheelchair Distance: 200 Locomotion: Wheelchair: 6: Travels 150 ft or more, turns around, maneuvers to table, bed or toilet, negotiates 3% grade: maneuvers on rugs and over door sills independently FIM - Locomotion: Ambulation Locomotion: Ambulation Assistive Devices: Administrator Ambulation/Gait Assistance: 5: Supervision Locomotion: Ambulation: 2: Travels 50 - 149 ft with supervision/safety issues  Comprehension Comprehension Mode: Auditory Comprehension: 6-Follows complex conversation/direction: With extra time/assistive device  Expression Expression Mode: Verbal Expression: 6-Expresses complex ideas: With extra time/assistive device  Social Interaction Social Interaction Mode: Asleep Social Interaction: 7-Interacts appropriately with others - No medications needed.  Problem Solving Problem Solving Mode: Asleep Problem Solving: 5-Solves basic problems: With no assist  Memory Memory Mode: Asleep Memory: 6-More than reasonable amt of time  Medical Problem List and Plan:   1. Functional deficits secondary to deconditioning after multiple medical issues/sepsis/respiratory  failure   2. DVT Prophylaxis/Anticoagulation: Pharmaceutical: Lovenox   3. Chronic pain/Pain Management: continue Neurontin tid. Used fentanyl 100 mg as well as oxycodone 30 mg prn at home for chronic bilateral knee as well as neuropathic foot pain.  will increase neurontin dose, currently on Oxycodone $RemoveBefo'10mg'bmaWtsmPtNW$  Q4h prn, added hs oxycontin, will follow up with PCP for pain meds 4. Mood: team to provide ego support. Continue Lexapro. LCSW to follow for support and evaluation.   5. Neuropsych: This patient is capable of making decisions on her own behalf.   6. Skin/Wound Care: routine pressure relief measures. Monitor left groin daily.   7. Fluids/Electrolytes/Nutrition: Monitor I/O.   8. NSTEMI s/p PCI RCA, CAD and CFX DES: Continue ASA/Plavix. On Imdur, bisoprolol and Lipitor.   9. Acute systolic heart failure: Monitor daily weights and for other signs of overload. Continue Zebeta, lasix 40 mg/day, Cozaar 25 mg/day and aldactone 12. $RemoveBefor'5mg'OlCMLezhwPoF$  /day. Monitor for tolerance of medications.   10 DM type 2 with neuropathy: Hgb A1c- 7.2. Will monitor BS with ac/hs checks. Continue lantus insulin 20 units daily. SSI for elevated BS.   11. Left ant ankle  status post hardware removal and drainage, has antibiotic beads , D/C IV Vanc         LOS (Days) 15 A FACE TO FACE EVALUATION WAS PERFORMED  KIRSTEINS,ANDREW E 04/11/2014, 8:43 AM

## 2014-04-11 NOTE — Progress Notes (Signed)
Social Work Discharge Note Discharge Note  The overall goal for the admission was met for:   Discharge location: [place] ASSISTING-24 HR SUPERVISION LEVEL  Length of Stay: Yes-15 DAYS  Discharge activity level: Yes-SUPERVISION LEVEL  Home/community participation: Yes  Services provided included: MD, RD, PT, OT, RN, CM, TR, Pharmacy and SW  Financial Services: Private Insurance: [place]  Follow-up services arranged: Home Health: [place] CARE-PT,OT,RN, DME: [place] and Patient/Family has no preference for HH/DME agencies  Comments (or additional information):FAMILY EDUCATION COMPLETED AND BOTH PT AND SON [place]  Patient/Family verbalized understanding of follow-up arrangements: Yes  Individual responsible for coordination of the follow-up plan: SELF & DAVID-SON  Confirmed correct DME delivered: Elease Hashimoto 04/11/2014    Elease Hashimoto

## 2014-04-11 NOTE — Plan of Care (Signed)
Problem: RH PAIN MANAGEMENT Goal: RH STG PAIN MANAGED AT OR BELOW PT'S PAIN GOAL <3  Outcome: Not Progressing Report pain as 8

## 2014-04-11 NOTE — Progress Notes (Signed)
Patient ID: Kaitlin RainsCheryl C Gittings, female   DOB: 04/30/1956, 58 y.o.   MRN: [id number] Dressing clean and dry. I will followup in the office this Friday. Anticipate discharge today.

## 2014-04-11 NOTE — Discharge Instructions (Signed)
Inpatient Rehab Discharge Instructions  Kaitlin RainsCheryl C [name] Discharge date and time: 04/11/14   Activities/Precautions/ Functional Status: Activity: activity as tolerated Diet: diabetic diet/ Heart healthy Wound Care: keep wound clean and dry  Functional status:  ___ No restrictions     ___ Walk up steps independently ___ 24/7 supervision/assistance   ___ Walk up steps with assistance _X__ Intermittent supervision/assistance  ___ Bathe/dress independently _X__ Walk with walker    _X__ Bathe/dress with assistance ___ Walk Independently    ___ Shower independently ___ Walk with assistance    ___ Shower with assistance _X__ No alcohol     ___ Return to work/school ________   Special Instructions: 1. Check blood sugars before meals and at bedtime. Make sure to eat a snack in the middle of the afternoon.  2. Keep splint dry. 3.Elevate legs when seated.    COMMUNITY REFERRALS UPON DISCHARGE:    Home Health:   PT,OT,RN   Agency:[facility] Phone:[phone] Date of last service:04/11/2014    Medical Equipment/Items Ordered:WHEELCHAIR& BEDSIDE COMMODE  Agency/Supplier:[facility]   [phone]   My questions have been answered and I understand these instructions. I will adhere to these goals and the provided educational materials after my discharge from the hospital.  Patient/Caregiver Signature _______________________________ Date __________  Clinician Signature _______________________________________ Date __________  Please bring this form and your medication list with you to all your follow-up doctor's appointments.

## 2014-04-11 NOTE — Telephone Encounter (Signed)
[name] was given rx for oxycodone by Marissa NestlePam Love PA at discharge from hospital and [name] just filled # 120 oxycodone 30 mg filled 03/20/14 from Dr Regino SchultzeMcGough and had # 150 filled 02/24/14 from Dr Regino SchultzeMcGough. Per [facility] she is getting oxycodone 30 mg and fentanyl 100mcg monthly. Please advise pharmacy what to do about your rx.

## 2014-04-11 NOTE — Telephone Encounter (Signed)
[name] NO SHOWED for her apt with Dorene Arerri Setzer, NP on 03/28/14. A NS letter has been mailed.

## 2014-04-11 NOTE — Progress Notes (Signed)
Pt discharged home with son. Discharged instructions provided by Marissa NestlePam Love, PA. All questions answered. Pt verbalized understanding. Tramadol 50mg  given prior to discharge for discomfort. Pt escorted off unit in w/c with personal belonging by Lelon MastNicole Carter, NT.

## 2014-04-11 NOTE — Discharge Summary (Signed)
Physician Discharge Summary  Patient ID: [name] MRN: [id number] DOB/AGE: [id number] 57 y.o.  Admit date: 03/27/2014 Discharge date: 04/11/2014  Discharge Diagnoses:  Principal Problem:   Physical debility Active Problems:   Ulcer of lower limb   Venous stasis   Diabetic foot infection   Anemia   Diabetes mellitus, controlled   NSTEMI (non-ST elevated myocardial infarction)   Diabetes mellitus with polyneuropathy   Discharged Condition: Stable.   Significant Diagnostic Studies: Dg Ankle 2 Views Left  04/03/2014   CLINICAL DATA:  Ankle pain.  EXAM: LEFT ANKLE - 2 VIEW  COMPARISON:  03/05/2014.  FINDINGS: Patient has had prior open reduction and internal fixation of the distal tibia and fibula. Good anatomic alignment. Degenerative change. No acute abnormality. Vascular calcification.  IMPRESSION: 1. Open reduction internal fixation distal tibia and fibula. Good anatomic alignment. 2. Severe degenerative change left ankle. 3. No acute abnormality.   Electronically Signed   By: Maisie Fus  Register   On: 04/03/2014 14:07   Ct Ankle Left Wo Contrast   04/03/2014   CLINICAL DATA:  Purulent drainage from the anterior ankle. Initial encounter.  EXAM: CT OF THE LEFT ANKLE WITHOUT CONTRAST  TECHNIQUE: Multidetector CT imaging of the left ankle was performed according to the standard protocol. Multiplanar CT image reconstructions were also generated.  COMPARISON:  Radiography from earlier the same day.  FINDINGS: Patient is status post ankle fracture with 2 screws traversing the tibial epiphysis. Status post fixation of the distal fibula with compression plate and interfragmentary screw. The fibular construct is unremarkable, but the lower screw in the distal tibia extends into the posterior ankle joint. The ankle is markedly degenerated as evidenced by narrowing and extensive subchondral cystic formation. Patchy sclerosis in the distal tibia may reflect chronic bone infarction. There is no  definite acute osteomyelitis as there is no convincing cortical erosion or periosteal reaction. There is no lucency around the screws. No chronic periosteal reaction or involucrum suggestive of chronic osteomyelitis. Subcutaneous fat infiltration is present along the medial and posterior ankle, correlating with the reported draining wound. There is gas in the posterior ankle joint which may reflect communication. No gross discontinuity or marked thickening of the medial tendons to suggest associated tenosynovitis.  There is mild degenerative changes to the subtalar joint, with spurring around the posterior facet. Mild talonavicular degeneration with marginal spurring.  Diffuse subcutaneous reticulation and dystrophic calcifications.  IMPRESSION: 1. Small volume gas in the posterior ankle joint could reflect septic arthritis in this patient with draining wound along the medial ankle. No visible osteomyelitis or hardware infection. 2. Remote left ankle fracture status post ORIF. A tibial screw penetrates the posterior ankle joint. 3. 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   Electronically Signed   By: Tiburcio Pea M.D.   On: 04/03/2014 23:19    Labs:  Basic Metabolic Panel:  Recent Labs Lab 04/05/14 0626 04/07/14 1046 04/10/14 0550  NA 137 136* 138  K 5.0 4.2 4.4  CL 98 94* 97  CO2 26 24 27   GLUCOSE 143* 135* 120*  BUN 17 12 15   CREATININE 0.60 0.65 0.61  CALCIUM 9.2 9.3 9.4    CBC: CBC Latest Ref Rng 04/03/2014 03/28/2014 03/25/2014  WBC 4.0 - 10.5 K/uL 5.7 4.5 5.0  Hemoglobin 12.0 - 15.0 g/dL 11.5(L) 10.6(L) 9.8(L)  Hematocrit 36.0 - 46.0 % 37.0 34.4(L) 31.4(L)  Platelets 150 - 400 K/uL 251 214 242     CBG:  Recent Labs Lab 04/10/14 0751  04/10/14 1140 04/10/14 1638 04/10/14 2059 04/11/14 0720  GLUCAP 131* 228* 72 116* 130*    Brief HPI:   [name] is a 58 y.o. 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 She was admitted on 03/05/14 with hypotension, tachycardia, fever of 101.9, leucocytosis and significant Left flank swelling. She was started on IV antibiotics for sepsis and was found to have left inguinal hematoma on CT. Patient was noted to have positive troponin's due to NSTEMI and started on IV heparin with serial CBC for monitoring. Right heel culture positive for pseudomonas and BC X 2 positive for MSSA and she has completed 3 week antibiotic course. TEE negative for thrombus or vegetations. Hospital course complicated by respiratory failure requiring intubation on 09/14 to 9/19 and reintubation 09/21 to 9/23. She underwent cardiac cath on 09/18 by Dr. Eldridge DaceVaranasi revealing severe 3 vessel CAD with EF 10-15%.  She underwent PCI of RCA and LAD on 03/22/14 by Dr. Excell Seltzerooper with recommendations for ASA and Plavix for lifelong duration. Left transudate effusion tapped for 500 cc clear fluid. Speech therapy following for treatment of dysphagia and diet advanced to Dysphagia 3, honey liquids by tsp.  She was showing improvement in mobility and activity tolerance and CIR recommended for follow up therapy.    Hospital Course: [name] was admitted to rehab 03/27/2014 for inpatient therapies to consist of PT, ST and OT at least three hours five days a week. Past admission physiatrist, therapy team and rehab RN have worked together to provide customized collaborative inpatient rehab. Blood pressures have stabilized without signs of orthostatic changes. Routine labs were done to monitor hydration status and she has shown good tolerance of thickened liquids. Dysphagia treatment has been ongoing and patient was advanced to dysphagia 3 with thin liquids. Weights were monitored daily and no signs of fluid overload noted. Diabetes has been monitored with ac/hs checks and metformin was resumed and amaryl was increased to  bid for tighter BS control. Follow up check of lytes show that hyponatremia has resolved and H/H has shown steady improvement. Chronic pain has improved with addition of low dose OxyContin at bedtime as well as prn oxycodone during the day. Po intake has been good and she is continent of bowel and bladder.   She developed erythema with fluctuance around left medial malleolus as well as complaints of severe pain. Dr. Lajoyce Cornersuda was contacted for input and xrays of foot showed no acute abnormality but patient had  purulent drainage from medial aspect of left ankle with concerns of osteomyelitis. CT ankle was done revealing small volume of gas with question of septic arthritis and no visible osteomyelitis or hardware infection. I and D with hardware removal was recommended for treatment by Dr. Lajoyce Cornersuda. Patient underwent removal of hardware with I & D and placement of antibiotic beads on 04/05/14. Post op maintained on IV vancomycin till discharge and no further antibiotic needs per ortho. Post op surgical dressing is in place--dry and intact and she was set for follow up dressing change on 10/23.   Despite need for surgical procedure, patient continued to make good gains and was at supervision level at discharge.  At wheelchair levelat discharge. She will continue to receive home health PT, OT and RN by [facility] for further therapies.    Rehab course: During patient's stay  in rehab weekly team conferences were held to monitor patient's progress, set goals and discuss barriers to discharge. Patient has had improvement in activity tolerance, balance, postural control, as well as ability to compensate for deficits. She is modified independent for transfers. She requires supervision with bathing, dressing, home management tasks as well as ambulation. She is able to ambulate 13' with RW and supervision. She was educated on HEP and family education was done with son on assistance needed past discharge.        Disposition: 62-Rehab Facility   Diet: Diabetic diet--soft textures.   Special Instructions: 1. Check blood sugars before meals and at bedtime.  2. Continue safe swallow strategies.      Medication List    STOP taking these medications       fentaNYL 100 MCG/HR  Commonly known as:  DURAGESIC - dosed mcg/hr     gabapentin 600 MG tablet  Commonly known as:  NEURONTIN  Replaced by:  gabapentin 400 MG capsule     oxycodone 30 MG immediate release tablet  Commonly known as:  ROXICODONE  Replaced by:  OxyCODONE 10 mg T12a 12 hr tablet      TAKE these medications       alprazolam 2 MG tablet  Commonly known as:  XANAX  Take 1 mg by mouth every 6 (six) hours as needed for sleep or anxiety.     aspirin 81 MG chewable tablet  Chew 1 tablet (81 mg total) by mouth daily.     atorvastatin 80 MG tablet  Commonly known as:  LIPITOR  Take 1 tablet (80 mg total) by mouth daily at 6 PM.     bisoprolol 5 MG tablet  Commonly known as:  ZEBETA  Take 1 tablet (5 mg total) by mouth daily.     clopidogrel 75 MG tablet  Commonly known as:  PLAVIX  Take 1 tablet (75 mg total) by mouth daily.     DSS 100 MG Caps  Take 100 mg by mouth 2 (two) times daily.     escitalopram 10 MG tablet  Commonly known as:  LEXAPRO  Take 1 tablet (10 mg total) by mouth daily.     fluticasone 50 MCG/ACT nasal spray  Commonly known as:  FLONASE  Place 1 spray into both nostrils daily as needed for allergies.     furosemide 40 MG tablet  Commonly known as:  LASIX  Take 1 tablet (40 mg total) by mouth daily.     gabapentin 400 MG capsule  Commonly known as:  NEURONTIN  Take 1 capsule (400 mg total) by mouth 3 (three) times daily.     glimepiride 2 MG tablet  Commonly known as:  AMARYL  Take 1 tablet (2 mg total) by mouth 2 (two) times daily.     losartan 25 MG tablet  Commonly known as:  COZAAR  Take 1 tablet (25 mg total) by mouth at bedtime.     metFORMIN 1000 MG tablet  Commonly known  as:  GLUCOPHAGE  Take 1 tablet (1,000 mg total) by mouth 2 (two) times daily with a meal.     methocarbamol 500 MG tablet  Commonly known as:  ROBAXIN  Take 1 tablet (500 mg total) by mouth every 6 (six) hours as needed for muscle spasms.     MUSCLE RUB 10-15 % Crea  Apply 1 application topically 3 (three) times daily before meals.     nitroGLYCERIN 0.4 mg/hr patch  Commonly known as:  NITRODUR - Dosed in mg/24 hr  Place 1 patch (0.4 mg total) onto the skin daily.     nystatin 100000 UNIT/GM Powd  Apply 1 g topically 3 (three) times daily.     OxyCODONE 10 mg T12a 12 hr tablet--Rx #10  Commonly known as:  OXYCONTIN  Take 1 tablet (10 mg total) by mouth at bedtime.     Oxycodone HCl 10 MG Tabs--Rx # 120 pills   Take 1 tablet (10 mg total) by mouth every 4 (four) hours as needed for severe pain.     protein supplement Powd  Take 6 g by mouth 3 (three) times daily with meals.     spironolactone 25 MG tablet  Commonly known as:  ALDACTONE  Take 0.5 tablets (12.5 mg total) by mouth daily.     traMADol 50 MG tablet  Commonly known as:  ULTRAM  Take 1 tablet (50 mg total) by mouth every 6 (six) hours as needed for moderate pain.     VESICARE 5 MG tablet  Generic drug:  solifenacin  Take 5 mg by mouth daily.      ASK your doctor about these medications       insulin glargine 100 UNIT/ML injection  Commonly known as:  LANTUS  Inject 0.2 mLs (20 Units total) into the skin daily.           Follow-up Information   Call Erick ColaceKIRSTEINS,ANDREW E, MD. (As needed)    Specialty:  Physical Medicine and Rehabilitation   Contact information:   [address] [place] [address] [phone]       Follow up with Tonny BollmanMichael Cooper, MD. Call today. (for  follow up on cardiac disease/stent)    Specialty:  Cardiology   Contact information:   [address] [place] [address] [phone]       Follow up with Nadara MustardUDA,MARCUS V, MD On 04/14/2014. (Appointment  at 7:45 am)    Specialty:  Orthopedic Surgery   Contact information:   [address] [place] [address] [phone]       Follow up with Lenise HeraldMANN, BENJAMIN, PA-C On 04/19/2014. (APPT @ 9;30 AM)    Specialties:  Physician Assistant, Internal Medicine   Contact information:   [address] [place] [address] [phone]       Signed: Jacquelynn CreeLove, Pamela S 04/11/2014, 10:03 AM

## 2014-04-12 NOTE — Telephone Encounter (Signed)
I spoke with [name] about the pharmacists concern.  [name] said to tell them to fill the oxycontin only.  [name] has been in the hospital for the past month and has an appt with a physician next week to work on wean down.  The pharmacy says that they have been concerned and had problems with the son primarily related to her prescriptions

## 2014-04-14 ENCOUNTER — Other Ambulatory Visit (HOSPITAL_COMMUNITY): Payer: Self-pay | Admitting: Orthopedic Surgery

## 2014-04-14 LAB — FUNGUS CULTURE W SMEAR: Fungal Smear: NONE SEEN

## 2014-04-17 ENCOUNTER — Encounter (HOSPITAL_COMMUNITY): Payer: Self-pay | Admitting: Pharmacy Technician

## 2014-04-18 ENCOUNTER — Encounter (HOSPITAL_COMMUNITY): Payer: Self-pay | Admitting: *Deleted

## 2014-04-18 MED ORDER — CLINDAMYCIN PHOSPHATE 900 MG/50ML IV SOLN
900.0000 mg | INTRAVENOUS | Status: AC
  Administered 2014-04-19: 900 mg via INTRAVENOUS
  Filled 2014-04-18: qty 50

## 2014-04-18 NOTE — Progress Notes (Signed)
Spoke with [name], surgical scheduler for Dr. Lajoyce Cornersuda regarding Aspirin and Plavix administration. [facility] it is okay for pt to continue with both. Also, okay for pt to arrive at 9:00AM as instructed, instead of previous time of 8:00AM.  Pt called and updated with all of the above info.

## 2014-04-19 ENCOUNTER — Encounter (HOSPITAL_COMMUNITY): Payer: Self-pay | Admitting: *Deleted

## 2014-04-19 ENCOUNTER — Inpatient Hospital Stay (HOSPITAL_COMMUNITY)
Admission: RE | Admit: 2014-04-19 | Discharge: 2014-04-20 | DRG: 617 | Disposition: A | Discharge status: Skilled Nursing Facility | Payer: BC Managed Care – PPO | Source: Ambulatory Visit | Attending: Orthopedic Surgery | Admitting: Orthopedic Surgery

## 2014-04-19 ENCOUNTER — Encounter (HOSPITAL_COMMUNITY)
Admission: RE | Disposition: A | Discharge status: Skilled Nursing Facility | Payer: Self-pay | Source: Ambulatory Visit | Attending: Orthopedic Surgery

## 2014-04-19 ENCOUNTER — Encounter (HOSPITAL_COMMUNITY): Payer: BC Managed Care – PPO | Admitting: Anesthesiology

## 2014-04-19 ENCOUNTER — Inpatient Hospital Stay (HOSPITAL_COMMUNITY): Payer: BC Managed Care – PPO | Admitting: Anesthesiology

## 2014-04-19 DIAGNOSIS — L97329 Non-pressure chronic ulcer of left ankle with unspecified severity: Secondary | ICD-10-CM | POA: Diagnosis present

## 2014-04-19 DIAGNOSIS — M869 Osteomyelitis, unspecified: Secondary | ICD-10-CM | POA: Diagnosis present

## 2014-04-19 DIAGNOSIS — Z89519 Acquired absence of unspecified leg below knee: Secondary | ICD-10-CM

## 2014-04-19 DIAGNOSIS — E785 Hyperlipidemia, unspecified: Secondary | ICD-10-CM | POA: Diagnosis present

## 2014-04-19 DIAGNOSIS — Z87891 Personal history of nicotine dependence: Secondary | ICD-10-CM | POA: Diagnosis not present

## 2014-04-19 DIAGNOSIS — L98491 Non-pressure chronic ulcer of skin of other sites limited to breakdown of skin: Secondary | ICD-10-CM | POA: Diagnosis present

## 2014-04-19 DIAGNOSIS — Z6832 Body mass index (BMI) 32.0-32.9, adult: Secondary | ICD-10-CM

## 2014-04-19 DIAGNOSIS — Z79899 Other long term (current) drug therapy: Secondary | ICD-10-CM | POA: Diagnosis not present

## 2014-04-19 DIAGNOSIS — I252 Old myocardial infarction: Secondary | ICD-10-CM | POA: Diagnosis not present

## 2014-04-19 DIAGNOSIS — E669 Obesity, unspecified: Secondary | ICD-10-CM | POA: Diagnosis present

## 2014-04-19 DIAGNOSIS — E1151 Type 2 diabetes mellitus with diabetic peripheral angiopathy without gangrene: Secondary | ICD-10-CM | POA: Diagnosis present

## 2014-04-19 DIAGNOSIS — I2510 Atherosclerotic heart disease of native coronary artery without angina pectoris: Secondary | ICD-10-CM | POA: Diagnosis present

## 2014-04-19 DIAGNOSIS — L02416 Cutaneous abscess of left lower limb: Secondary | ICD-10-CM | POA: Diagnosis present

## 2014-04-19 DIAGNOSIS — I10 Essential (primary) hypertension: Secondary | ICD-10-CM | POA: Diagnosis present

## 2014-04-19 DIAGNOSIS — Z89441 Acquired absence of right ankle: Secondary | ICD-10-CM | POA: Diagnosis not present

## 2014-04-19 DIAGNOSIS — J449 Chronic obstructive pulmonary disease, unspecified: Secondary | ICD-10-CM | POA: Diagnosis present

## 2014-04-19 DIAGNOSIS — Z89512 Acquired absence of left leg below knee: Secondary | ICD-10-CM

## 2014-04-19 DIAGNOSIS — M1990 Unspecified osteoarthritis, unspecified site: Secondary | ICD-10-CM | POA: Diagnosis present

## 2014-04-19 DIAGNOSIS — E1169 Type 2 diabetes mellitus with other specified complication: Secondary | ICD-10-CM | POA: Diagnosis present

## 2014-04-19 DIAGNOSIS — Z7902 Long term (current) use of antithrombotics/antiplatelets: Secondary | ICD-10-CM | POA: Diagnosis not present

## 2014-04-19 DIAGNOSIS — Z7982 Long term (current) use of aspirin: Secondary | ICD-10-CM

## 2014-04-19 DIAGNOSIS — F329 Major depressive disorder, single episode, unspecified: Secondary | ICD-10-CM | POA: Diagnosis present

## 2014-04-19 HISTORY — PX: AMPUTATION: SHX166

## 2014-04-19 HISTORY — DX: Depression, unspecified: F32.A

## 2014-04-19 HISTORY — DX: Major depressive disorder, single episode, unspecified: F32.9

## 2014-04-19 HISTORY — PX: BELOW KNEE LEG AMPUTATION: SUR23

## 2014-04-19 LAB — COMPREHENSIVE METABOLIC PANEL
ALT: 13 U/L (ref 0–35)
AST: 16 U/L (ref 0–37)
Albumin: 3.2 g/dL — ABNORMAL LOW (ref 3.5–5.2)
Alkaline Phosphatase: 68 U/L (ref 39–117)
Anion gap: 16 — ABNORMAL HIGH (ref 5–15)
BUN: 13 mg/dL (ref 6–23)
CALCIUM: 8.6 mg/dL (ref 8.4–10.5)
CO2: 22 meq/L (ref 19–32)
Chloride: 99 mEq/L (ref 96–112)
Creatinine, Ser: 0.5 mg/dL (ref 0.50–1.10)
GLUCOSE: 109 mg/dL — AB (ref 70–99)
Potassium: 3.9 mEq/L (ref 3.7–5.3)
Sodium: 137 mEq/L (ref 137–147)
Total Bilirubin: 0.2 mg/dL — ABNORMAL LOW (ref 0.3–1.2)
Total Protein: 7.4 g/dL (ref 6.0–8.3)

## 2014-04-19 LAB — CBC
HCT: 31.6 % — ABNORMAL LOW (ref 36.0–46.0)
Hemoglobin: 10 g/dL — ABNORMAL LOW (ref 12.0–15.0)
MCH: 25.6 pg — AB (ref 26.0–34.0)
MCHC: 31.6 g/dL (ref 30.0–36.0)
MCV: 81 fL (ref 78.0–100.0)
PLATELETS: 314 10*3/uL (ref 150–400)
RBC: 3.9 MIL/uL (ref 3.87–5.11)
RDW: 21.4 % — ABNORMAL HIGH (ref 11.5–15.5)
WBC: 8.1 10*3/uL (ref 4.0–10.5)

## 2014-04-19 LAB — GLUCOSE, CAPILLARY
GLUCOSE-CAPILLARY: 259 mg/dL — AB (ref 70–99)
Glucose-Capillary: 118 mg/dL — ABNORMAL HIGH (ref 70–99)
Glucose-Capillary: 109 mg/dL — ABNORMAL HIGH (ref 70–99)
Glucose-Capillary: 272 mg/dL — ABNORMAL HIGH (ref 70–99)

## 2014-04-19 LAB — PROTIME-INR
INR: 1.03 (ref 0.00–1.49)
PROTHROMBIN TIME: 13.7 s (ref 11.6–15.2)

## 2014-04-19 LAB — APTT: aPTT: 29 seconds (ref 24–37)

## 2014-04-19 SURGERY — AMPUTATION BELOW KNEE
Anesthesia: General | Site: Leg Lower | Laterality: Left

## 2014-04-19 MED ORDER — ALPRAZOLAM 0.5 MG PO TABS: 2.0000 mg | ORAL_TABLET | Freq: Four times a day (QID) | ORAL | Status: DC | PRN

## 2014-04-19 MED ORDER — PROMETHAZINE HCL 25 MG/ML IJ SOLN: 6.2500 mg | INTRAMUSCULAR | Status: DC | PRN

## 2014-04-19 MED ORDER — OXYCODONE-ACETAMINOPHEN 5-325 MG PO TABS
ORAL_TABLET | ORAL | Status: AC
  Filled 2014-04-19: qty 2

## 2014-04-19 MED ORDER — EPHEDRINE SULFATE 50 MG/ML IJ SOLN
INTRAMUSCULAR | Status: DC | PRN
  Administered 2014-04-19: 10 mg via INTRAVENOUS

## 2014-04-19 MED ORDER — METHOCARBAMOL 1000 MG/10ML IJ SOLN
500.0000 mg | Freq: Four times a day (QID) | INTRAVENOUS | Status: DC | PRN
  Filled 2014-04-19: qty 5

## 2014-04-19 MED ORDER — MIDAZOLAM HCL 2 MG/2ML IJ SOLN
INTRAMUSCULAR | Status: AC
  Filled 2014-04-19: qty 2

## 2014-04-19 MED ORDER — ONDANSETRON HCL 4 MG/2ML IJ SOLN: 4.0000 mg | Freq: Four times a day (QID) | INTRAMUSCULAR | Status: DC | PRN

## 2014-04-19 MED ORDER — SPIRONOLACTONE 12.5 MG HALF TABLET
12.5000 mg | ORAL_TABLET | Freq: Every day | ORAL | Status: DC
  Administered 2014-04-19 – 2014-04-20 (×2): 12.5 mg via ORAL
  Filled 2014-04-19 (×2): qty 1

## 2014-04-19 MED ORDER — SODIUM CHLORIDE 0.9 % IJ SOLN
INTRAMUSCULAR | Status: AC
  Filled 2014-04-19: qty 10

## 2014-04-19 MED ORDER — GLIMEPIRIDE 2 MG PO TABS
2.0000 mg | ORAL_TABLET | Freq: Two times a day (BID) | ORAL | Status: DC
  Administered 2014-04-19 – 2014-04-20 (×3): 2 mg via ORAL
  Filled 2014-04-19 (×3): qty 1
  Filled 2014-04-19: qty 2
  Filled 2014-04-19: qty 1

## 2014-04-19 MED ORDER — SODIUM CHLORIDE 0.9 % IV SOLN
INTRAVENOUS | Status: DC
  Administered 2014-04-19: 15:00:00 via INTRAVENOUS

## 2014-04-19 MED ORDER — BENEPROTEIN PO POWD
1.0000 | Freq: Three times a day (TID) | ORAL | Status: DC
  Administered 2014-04-19 – 2014-04-20 (×3): 6 g via ORAL
  Filled 2014-04-19: qty 227

## 2014-04-19 MED ORDER — LOSARTAN POTASSIUM 25 MG PO TABS
25.0000 mg | ORAL_TABLET | Freq: Every day | ORAL | Status: DC
  Administered 2014-04-19 – 2014-04-20 (×2): 25 mg via ORAL
  Filled 2014-04-19 (×3): qty 1

## 2014-04-19 MED ORDER — ONDANSETRON HCL 4 MG PO TABS: 4.0000 mg | ORAL_TABLET | Freq: Four times a day (QID) | ORAL | Status: DC | PRN

## 2014-04-19 MED ORDER — INSULIN ASPART 100 UNIT/ML ~~LOC~~ SOLN
0.0000 [IU] | Freq: Three times a day (TID) | SUBCUTANEOUS | Status: DC
  Administered 2014-04-19: 8 [IU] via SUBCUTANEOUS
  Administered 2014-04-20: 2 [IU] via SUBCUTANEOUS
  Administered 2014-04-20: 3 [IU] via SUBCUTANEOUS

## 2014-04-19 MED ORDER — METHOCARBAMOL 500 MG PO TABS
ORAL_TABLET | ORAL | Status: AC
Start: 2014-04-19 — End: 2014-04-20
  Filled 2014-04-19: qty 1

## 2014-04-19 MED ORDER — LIDOCAINE HCL (CARDIAC) 20 MG/ML IV SOLN
INTRAVENOUS | Status: DC | PRN
  Administered 2014-04-19: 100 mg via INTRAVENOUS

## 2014-04-19 MED ORDER — DOCUSATE SODIUM 100 MG PO CAPS: 100.0000 mg | ORAL_CAPSULE | Freq: Two times a day (BID) | ORAL | Status: DC

## 2014-04-19 MED ORDER — ESCITALOPRAM OXALATE 10 MG PO TABS
10.0000 mg | ORAL_TABLET | Freq: Every day | ORAL | Status: DC
  Administered 2014-04-20: 10 mg via ORAL
  Filled 2014-04-19: qty 1

## 2014-04-19 MED ORDER — OXYCODONE-ACETAMINOPHEN 5-325 MG PO TABS
1.0000 | ORAL_TABLET | ORAL | Status: DC | PRN
  Administered 2014-04-19 – 2014-04-20 (×3): 2 via ORAL
  Filled 2014-04-19 (×3): qty 2

## 2014-04-19 MED ORDER — PROPOFOL 10 MG/ML IV BOLUS
INTRAVENOUS | Status: DC | PRN
  Administered 2014-04-19: 150 mg via INTRAVENOUS

## 2014-04-19 MED ORDER — OXYCODONE HCL 5 MG PO TABS
30.0000 mg | ORAL_TABLET | ORAL | Status: DC | PRN
  Administered 2014-04-19 – 2014-04-20 (×2): 30 mg via ORAL
  Filled 2014-04-19 (×2): qty 6

## 2014-04-19 MED ORDER — DOCUSATE SODIUM 100 MG PO CAPS
100.0000 mg | ORAL_CAPSULE | Freq: Two times a day (BID) | ORAL | Status: DC
  Administered 2014-04-19 – 2014-04-20 (×4): 100 mg via ORAL
  Filled 2014-04-19 (×2): qty 1

## 2014-04-19 MED ORDER — FENTANYL CITRATE 0.05 MG/ML IJ SOLN
INTRAMUSCULAR | Status: AC
  Filled 2014-04-19: qty 5

## 2014-04-19 MED ORDER — DEXAMETHASONE SODIUM PHOSPHATE 4 MG/ML IJ SOLN
INTRAMUSCULAR | Status: AC
  Filled 2014-04-19: qty 1

## 2014-04-19 MED ORDER — MIDAZOLAM HCL 5 MG/5ML IJ SOLN
INTRAMUSCULAR | Status: DC | PRN
  Administered 2014-04-19: 2 mg via INTRAVENOUS

## 2014-04-19 MED ORDER — ASPIRIN 81 MG PO CHEW
81.0000 mg | CHEWABLE_TABLET | Freq: Every day | ORAL | Status: DC
  Administered 2014-04-19 – 2014-04-20 (×2): 81 mg via ORAL
  Filled 2014-04-19 (×2): qty 1

## 2014-04-19 MED ORDER — METHOCARBAMOL 500 MG PO TABS
500.0000 mg | ORAL_TABLET | Freq: Four times a day (QID) | ORAL | Status: DC | PRN
  Administered 2014-04-19 (×2): 500 mg via ORAL
  Filled 2014-04-19: qty 1

## 2014-04-19 MED ORDER — EPHEDRINE SULFATE 50 MG/ML IJ SOLN
INTRAMUSCULAR | Status: AC
  Filled 2014-04-19: qty 1

## 2014-04-19 MED ORDER — GABAPENTIN 400 MG PO CAPS
400.0000 mg | ORAL_CAPSULE | Freq: Three times a day (TID) | ORAL | Status: DC
  Administered 2014-04-19 – 2014-04-20 (×5): 400 mg via ORAL
  Filled 2014-04-19 (×6): qty 1

## 2014-04-19 MED ORDER — METFORMIN HCL 500 MG PO TABS
1000.0000 mg | ORAL_TABLET | Freq: Two times a day (BID) | ORAL | Status: DC
  Administered 2014-04-19 – 2014-04-20 (×3): 1000 mg via ORAL
  Filled 2014-04-19 (×4): qty 2

## 2014-04-19 MED ORDER — MEPERIDINE HCL 25 MG/ML IJ SOLN: 6.2500 mg | INTRAMUSCULAR | Status: DC | PRN

## 2014-04-19 MED ORDER — ATORVASTATIN CALCIUM 80 MG PO TABS
80.0000 mg | ORAL_TABLET | Freq: Every day | ORAL | Status: DC
  Administered 2014-04-19 – 2014-04-20 (×2): 80 mg via ORAL
  Filled 2014-04-19 (×2): qty 1

## 2014-04-19 MED ORDER — FENTANYL CITRATE 0.05 MG/ML IJ SOLN
INTRAMUSCULAR | Status: AC
  Filled 2014-04-19: qty 2

## 2014-04-19 MED ORDER — LACTATED RINGERS IV SOLN
INTRAVENOUS | Status: DC
  Administered 2014-04-19: 10:00:00 via INTRAVENOUS

## 2014-04-19 MED ORDER — FENTANYL CITRATE 0.05 MG/ML IJ SOLN
INTRAMUSCULAR | Status: DC | PRN
  Administered 2014-04-19 (×4): 25 ug via INTRAVENOUS

## 2014-04-19 MED ORDER — HYDROMORPHONE HCL 1 MG/ML IJ SOLN
0.5000 mg | INTRAMUSCULAR | Status: DC | PRN
  Administered 2014-04-19: 1 mg via INTRAVENOUS
  Filled 2014-04-19: qty 1

## 2014-04-19 MED ORDER — PROPOFOL 10 MG/ML IV BOLUS
INTRAVENOUS | Status: AC
  Filled 2014-04-19: qty 20

## 2014-04-19 MED ORDER — FENTANYL 25 MCG/HR TD PT72
75.0000 ug | MEDICATED_PATCH | TRANSDERMAL | Status: DC
  Administered 2014-04-19: 75 ug via TRANSDERMAL
  Filled 2014-04-19 (×2): qty 1

## 2014-04-19 MED ORDER — CLOPIDOGREL BISULFATE 75 MG PO TABS
75.0000 mg | ORAL_TABLET | Freq: Every day | ORAL | Status: DC
  Administered 2014-04-19 – 2014-04-20 (×2): 75 mg via ORAL
  Filled 2014-04-19 (×2): qty 1

## 2014-04-19 MED ORDER — METOCLOPRAMIDE HCL 5 MG/ML IJ SOLN: 5.0000 mg | Freq: Three times a day (TID) | INTRAMUSCULAR | Status: DC | PRN

## 2014-04-19 MED ORDER — FENTANYL CITRATE 0.05 MG/ML IJ SOLN
25.0000 ug | INTRAMUSCULAR | Status: DC | PRN
  Administered 2014-04-19 (×4): 25 ug via INTRAVENOUS

## 2014-04-19 MED ORDER — METOCLOPRAMIDE HCL 5 MG PO TABS: 5.0000 mg | ORAL_TABLET | Freq: Three times a day (TID) | ORAL | Status: DC | PRN

## 2014-04-19 MED ORDER — 0.9 % SODIUM CHLORIDE (POUR BTL) OPTIME
TOPICAL | Status: DC | PRN
  Administered 2014-04-19: 1000 mL

## 2014-04-19 MED ORDER — ONDANSETRON HCL 4 MG/2ML IJ SOLN
INTRAMUSCULAR | Status: DC | PRN
  Administered 2014-04-19: 4 mg via INTRAVENOUS

## 2014-04-19 MED ORDER — FUROSEMIDE 40 MG PO TABS
40.0000 mg | ORAL_TABLET | Freq: Every day | ORAL | Status: DC
  Administered 2014-04-19: 40 mg via ORAL
  Filled 2014-04-19 (×2): qty 1

## 2014-04-19 MED ORDER — FLUTICASONE PROPIONATE 50 MCG/ACT NA SUSP
2.0000 | Freq: Every day | NASAL | Status: DC | PRN
  Administered 2014-04-19 – 2014-04-20 (×2): 2 via NASAL
  Filled 2014-04-19: qty 16

## 2014-04-19 MED ORDER — BISOPROLOL FUMARATE 5 MG PO TABS
5.0000 mg | ORAL_TABLET | Freq: Every day | ORAL | Status: DC
  Filled 2014-04-19: qty 1

## 2014-04-19 MED ORDER — DEXAMETHASONE SODIUM PHOSPHATE 4 MG/ML IJ SOLN
INTRAMUSCULAR | Status: DC | PRN
  Administered 2014-04-19: 4 mg via INTRAVENOUS

## 2014-04-19 MED ORDER — CLINDAMYCIN PHOSPHATE 600 MG/50ML IV SOLN
600.0000 mg | Freq: Four times a day (QID) | INTRAVENOUS | Status: AC
  Administered 2014-04-19 – 2014-04-20 (×3): 600 mg via INTRAVENOUS
  Filled 2014-04-19 (×3): qty 50

## 2014-04-19 SURGICAL SUPPLY — 43 items
BANDAGE ESMARK 6X9 LF (GAUZE/BANDAGES/DRESSINGS) ×1 IMPLANT
BLADE SAW RECIP 87.9 MT (BLADE) ×3 IMPLANT
BLADE SURG 21 STRL SS (BLADE) ×3 IMPLANT
BNDG CMPR 9X6 STRL LF SNTH (GAUZE/BANDAGES/DRESSINGS) ×1
BNDG COHESIVE 6X5 TAN STRL LF (GAUZE/BANDAGES/DRESSINGS) ×4 IMPLANT
BNDG ESMARK 6X9 LF (GAUZE/BANDAGES/DRESSINGS) ×3
BNDG GAUZE ELAST 4 BULKY (GAUZE/BANDAGES/DRESSINGS) ×6 IMPLANT
COVER SURGICAL LIGHT HANDLE (MISCELLANEOUS) ×3 IMPLANT
CUFF TOURNIQUET SINGLE 34IN LL (TOURNIQUET CUFF) IMPLANT
CUFF TOURNIQUET SINGLE 44IN (TOURNIQUET CUFF) IMPLANT
DRAIN PENROSE 1/2X12 LTX STRL (WOUND CARE) IMPLANT
DRAPE EXTREMITY T 121X128X90 (DRAPE) ×3 IMPLANT
DRAPE PROXIMA HALF (DRAPES) ×6 IMPLANT
DRAPE U-SHAPE 47X51 STRL (DRAPES) ×6 IMPLANT
DRSG ADAPTIC 3X8 NADH LF (GAUZE/BANDAGES/DRESSINGS) ×3 IMPLANT
DRSG PAD ABDOMINAL 8X10 ST (GAUZE/BANDAGES/DRESSINGS) ×3 IMPLANT
DURAPREP 26ML APPLICATOR (WOUND CARE) ×3 IMPLANT
ELECT REM PT RETURN 9FT ADLT (ELECTROSURGICAL) ×3
ELECTRODE REM PT RTRN 9FT ADLT (ELECTROSURGICAL) ×1 IMPLANT
GAUZE SPONGE 4X4 12PLY STRL (GAUZE/BANDAGES/DRESSINGS) ×3 IMPLANT
GLOVE BIOGEL PI IND STRL 9 (GLOVE) ×1 IMPLANT
GLOVE BIOGEL PI INDICATOR 9 (GLOVE) ×2
GLOVE SURG ORTHO 9.0 STRL STRW (GLOVE) ×3 IMPLANT
GOWN STRL REUS W/ TWL XL LVL3 (GOWN DISPOSABLE) ×2 IMPLANT
GOWN STRL REUS W/TWL XL LVL3 (GOWN DISPOSABLE) ×6
KIT BASIN OR (CUSTOM PROCEDURE TRAY) ×3 IMPLANT
KIT ROOM TURNOVER OR (KITS) ×3 IMPLANT
MANIFOLD NEPTUNE II (INSTRUMENTS) ×3 IMPLANT
NS IRRIG 1000ML POUR BTL (IV SOLUTION) ×3 IMPLANT
PACK GENERAL/GYN (CUSTOM PROCEDURE TRAY) ×3 IMPLANT
PAD ARMBOARD 7.5X6 YLW CONV (MISCELLANEOUS) ×6 IMPLANT
SPONGE GAUZE 4X4 12PLY STER LF (GAUZE/BANDAGES/DRESSINGS) ×2 IMPLANT
SPONGE LAP 18X18 X RAY DECT (DISPOSABLE) IMPLANT
STAPLER VISISTAT 35W (STAPLE) IMPLANT
STOCKINETTE IMPERVIOUS LG (DRAPES) ×3 IMPLANT
SUT PDS AB 1 CT  36 (SUTURE)
SUT PDS AB 1 CT 36 (SUTURE) IMPLANT
SUT SILK 2 0 (SUTURE) ×3
SUT SILK 2-0 18XBRD TIE 12 (SUTURE) ×1 IMPLANT
TOWEL OR 17X24 6PK STRL BLUE (TOWEL DISPOSABLE) ×3 IMPLANT
TOWEL OR 17X26 10 PK STRL BLUE (TOWEL DISPOSABLE) ×3 IMPLANT
TUBE ANAEROBIC SPECIMEN COL (MISCELLANEOUS) IMPLANT
WATER STERILE IRR 1000ML POUR (IV SOLUTION) ×3 IMPLANT

## 2014-04-19 NOTE — Anesthesia Preprocedure Evaluation (Addendum)
Anesthesia Evaluation  Patient identified by MRN, date of birth, ID band Patient awake    Reviewed: Allergy & Precautions, H&P , NPO status , Patient's Chart, lab work & pertinent test results, reviewed documented beta blocker date and time   Airway Mallampati: II   Neck ROM: Full    Dental  (+) Chipped, Dental Advisory Given,    Pulmonary COPDformer smoker (quit very recently),  Heavy smoking HX, 70 pack year breath sounds clear to auscultation        Cardiovascular hypertension, On Medications + CAD, + Cardiac Stents and + Peripheral Vascular Disease Rhythm:Regular  CAD, stents, EF 35% 9/15, continues ASA and Plavix, EKG with old anterior MI, many resent GA procedures   Neuro/Psych    GI/Hepatic   Endo/Other  diabetes, Poorly Controlled, Type 2, Insulin Dependent  Renal/GU      Musculoskeletal   Abdominal (+) + obese,   Peds  Hematology  (+) anemia ,   Anesthesia Other Findings   Reproductive/Obstetrics                           Anesthesia Physical Anesthesia Plan  ASA: IV  Anesthesia Plan: General LMA   Post-op Pain Management:    Induction: Intravenous  Airway Management Planned: LMA  Additional Equipment:   Intra-op Plan:   Post-operative Plan:   Informed Consent: I have reviewed the patients History and Physical, chart, labs and discussed the procedure including the risks, benefits and alternatives for the proposed anesthesia with the patient or authorized representative who has indicated his/her understanding and acceptance.     Plan Discussed with: Anesthesiologist  Anesthesia Plan Comments: (Labs from 10/15 ok, new labs to be drawn today, ASA IV, CAD with DES STENTS, Poorly controlled DM, severe PVD, has tolerated GA with LMA many times,EF 35%, would give IV Tylenlol)       Anesthesia Quick Evaluation

## 2014-04-19 NOTE — Anesthesia Procedure Notes (Signed)
Procedure Name: LMA Insertion Date/Time: 04/19/2014 11:13 AM Performed by: Jerilee HohMUMM, Simra Fiebig N Pre-anesthesia Checklist: Emergency Drugs available, Patient identified, Suction available and Patient being monitored Patient Re-evaluated:Patient Re-evaluated prior to inductionOxygen Delivery Method: Circle system utilized Preoxygenation: Pre-oxygenation with 100% oxygen Intubation Type: IV induction Ventilation: Mask ventilation without difficulty LMA: LMA inserted LMA Size: 4.0 Tube type: Oral Number of attempts: 1 Airway Equipment and Method: Stylet Placement Confirmation: positive ETCO2 and breath sounds checked- equal and bilateral Tube secured with: Tape Dental Injury: Teeth and Oropharynx as per pre-operative assessment

## 2014-04-19 NOTE — Op Note (Signed)
   Date of Surgery: 04/19/2014  INDICATIONS: Ms. [name] is a 58 y.o.-year-old female who developed osteomyelitis left distal tibia secondary to deep retained hardware peripheral vascular disease and diabetes. ;  The patient did consent to the procedure after discussion of the risks and benefits.  PREOPERATIVE DIAGNOSIS: Abscess osteomyelitis ulceration left ankle  POSTOPERATIVE DIAGNOSIS: Same.  PROCEDURE: Transtibial amputation  SURGEON: Lajoyce Cornersuda, M.D.  ANESTHESIA:  general  IV FLUIDS AND URINE: See anesthesia.  ESTIMATED BLOOD LOSS: min mL.  COMPLICATIONS: None.  DESCRIPTION OF PROCEDURE: The patient was brought to the operating room and placed supine on the operating table.  The patient had been signed prior to the procedure and this was documented. The patient had the anesthesia placed by the anesthesiologist.  A time-out was performed to confirm that this was the correct patient, site, side and location. The patient had an SCD on the opposite lower extremity. The patient did receive antibiotics prior to the incision and was re-dosed during the procedure as needed at indicated intervals.  The patient had the operative extremity prepped and draped in the standard surgical fashion.    A transverse incision was made 11 cm distal to the tibial tubercle. This curved proximally and a large posterior flap was created. The tibia was transected 1 cm proximal to the skin incision. The fibula was transected just proximal to the tibial incision. The tibia was beveled anteriorly. A large posterior flap was created. The sciatic nerve was pulled cut and allowed to retract. The vascular bundles were suture ligated with 2-0 silk. The deep and superficial fascial layers were closed using #1 PDS. The skin was closed using staples and subcutaneous 0 nylon. The wound was covered with Adaptic orthopedic sponges AB dressing Kerlix and Coban. Patient was extubated taken to the PACU in stable condition.  Kaitlin BakerMarcus Kadi Hession,  MD [facility] 11:52 AM

## 2014-04-19 NOTE — Anesthesia Postprocedure Evaluation (Signed)
  Anesthesia Post-op Note  Patient: Kaitlin RainsCheryl C Terrien  Procedure(s) Performed: Procedure(s): Left Below Knee Amputation (Left)  Patient Location: PACU  Anesthesia Type:General  Level of Consciousness: awake, alert  and oriented  Airway and Oxygen Therapy: Patient Spontanous Breathing and Patient connected to nasal cannula oxygen  Post-op Pain: mild  Post-op Assessment: Post-op Vital signs reviewed, Patient's Cardiovascular Status Stable, Respiratory Function Stable, Patent Airway and No signs of Nausea or vomiting  Post-op Vital Signs: Reviewed and stable  Last Vitals:  Filed Vitals:   04/19/14 1228  BP: 138/62  Pulse: 91  Temp:   Resp: 19    Complications: No apparent anesthesia complications

## 2014-04-19 NOTE — Progress Notes (Signed)
[facility]  Patient Status: Active (receiving services up to time of hospitalization)  [facility] is providing the following services: RN, PT and OT  If patient discharges after hours, please call [phone].   Kaitlin DadaMiranda [name] 04/19/2014, 5:00 PM

## 2014-04-19 NOTE — H&P (Signed)
[name] is an 58 y.o. female.   Chief Complaint: Ulceration and osteomyelitis left foot HPI: Patient is a 58 year old woman diabetic peripheral vascular disease with ulceration osteomyelitis left ankle status post removal of deep retained hardware with abscess left ankle.  Past Medical History  Diagnosis Date  . Diabetes mellitus   . Peripheral vascular disease   . Hyperlipidemia   . Obesity   . Hypertension   . Arthritis   . Ulcer of ankle     right  . Coronary artery disease   . COPD (chronic obstructive pulmonary disease)   . NSTEMI (non-ST elevated myocardial infarction)   . Depression     Past Surgical History  Procedure Laterality Date  . Cholecystectomy    . Femoral-tibial bypass graft Right 09/27/2013    Procedure: BYPASS GRAFT FEMORAL-TIBIAL ARTERY-RIGHT;  Surgeon: Nada LibmanVance W Brabham, MD;  [facility];  Service: Vascular;  Laterality: Right;  . Abdominal aorta stent Right   . Bypass graft popliteal to tibial Right   . Coronary angioplasty with stent placement      proximcal RCA stent ~ 2001  . Amputation Right 10/14/2013    Procedure: AMPUTATION RAY;  Surgeon: Nadara MustardMarcus V Zaydn Gutridge, MD;  [facility];  Service: Orthopedics;  Laterality: Right;  Right 2nd Ray Amputation, [place] Graft Right Ankle  . Ankle surgery Left     pinnining for fracture  . I&d extremity Left 04/05/2014    Procedure: Irrigation and Debridement left Ankle, Remove Deep Hardware, and Place Antibiotic Beads;  Surgeon: Nadara MustardMarcus Loray Akard V, MD;  Location: [facility] OR;  Service: Orthopedics;  Laterality: Left;  . Fracture surgery      Left ankle    Family History  Problem Relation Age of Onset  . Cancer Mother   . Heart disease Father   . Hyperlipidemia Father   . Hypertension Father   . Other Father     varicose veins  . Heart attack Father   . Heart disease Sister   . Hyperlipidemia Sister   . Hypertension Sister   . Other Sister     varicose veins  . Heart attack Sister   . Heart disease Brother    . Hypertension Brother   . Hyperlipidemia Brother   . Other Brother     varicose veins  . Heart attack Brother    Social History:  reports that she quit smoking about 5 weeks ago. Her smoking use included Cigarettes. She has a 60 pack-year smoking history. She has never used smokeless tobacco. She reports that she does not drink alcohol or use illicit drugs.  Allergies:  Allergies  Allergen Reactions  . Adhesive [Tape] Rash  . Penicillins Hives and Other (See Comments)    Tolerates cefepime    No prescriptions prior to admission    No results found for this or any previous visit (from the past 48 hour(s)). No results found.  Review of Systems  All other systems reviewed and are negative.   There were no vitals taken for this visit. Physical Exam  On examination patient had progressive dehiscence of the skin at the site that she developed the osteomyelitis and infection from the deep retained hardware. Patient has had progressive wound breakdown exposed bone. Assessment/Plan Assessment: Necrotic wound left ankle status post limb salvage with debridement of a deep abscess from deep retained hardware with progressive necrotic skin exposed bone and tendons.  Plan: Will plan for transtibial amputation on the left. Risks and benefits were discussed including  risk of the wound not healing. Patient states she understands and wishes to proceed at this time.  Stephaniemarie Stoffel V 04/19/2014, 6:47 AM

## 2014-04-19 NOTE — Care Management Note (Signed)
  Page 1 of 1   04/19/2014     3:07:00 PM CARE MANAGEMENT NOTE 04/19/2014  Patient:  [name],[name]   Account Number:  [id number]  Date Initiated:  04/19/2014  Documentation initiated by:  Ronny FlurryWILE,Glynn Yepes  Subjective/Objective Assessment:     Action/Plan:   Anticipated DC Date:     Anticipated DC Plan:  HOME [facility] HOME [facility]         Choice offered to / List presented to:             Status of service:   Medicare Important Message given?   (If response is "NO", the following Medicare IM given date fields will be blank) Date Medicare IM given:   Medicare IM given by:   Date Additional Medicare IM given:   Additional Medicare IM given by:    Discharge Disposition:    Per UR Regulation:  Reviewed for med. necessity/level of care/duration of stay  If discussed at Long Length of Stay Meetings, dates discussed:    Comments:  04-19-14 Patient active with [facility] . Resumption of care orders entered . [facility] aware. Ronny FlurryHeather Sheyna Pettibone RN BSN

## 2014-04-19 NOTE — Transfer of Care (Signed)
Immediate Anesthesia Transfer of Care Note  Patient: [name]  Procedure(s) Performed: Procedure(s): Left Below Knee Amputation (Left)  Patient Location: PACU  Anesthesia Type:General  Level of Consciousness: awake, alert , oriented and patient cooperative  Airway & Oxygen Therapy: Patient Spontanous Breathing and Patient connected to nasal cannula oxygen  Post-op Assessment: Report given to PACU RN, Post -op Vital signs reviewed and stable and Patient moving all extremities  Post vital signs: Reviewed and stable  Complications: No apparent anesthesia complications

## 2014-04-20 LAB — GLUCOSE, CAPILLARY
GLUCOSE-CAPILLARY: 137 mg/dL — AB (ref 70–99)
Glucose-Capillary: 114 mg/dL — ABNORMAL HIGH (ref 70–99)
Glucose-Capillary: 154 mg/dL — ABNORMAL HIGH (ref 70–99)

## 2014-04-20 MED ORDER — OXYCODONE-ACETAMINOPHEN 5-325 MG PO TABS: 1.0000 | ORAL_TABLET | ORAL | Status: DC | PRN

## 2014-04-20 MED ORDER — ASPIRIN EC 81 MG PO TBEC: 81.0000 mg | DELAYED_RELEASE_TABLET | Freq: Every day | ORAL | Status: DC

## 2014-04-20 NOTE — Progress Notes (Signed)
Rehab Admissions Coordinator Note:  Patient was screened by Clois DupesBoyette, Aerie Donica Godwin for appropriateness for an Inpatient Acute Rehab Consult per PT recommendation. At this time, we are recommending Inpatient Rehab consult. Pt recently discharged form inpt rehab 03/2014.  Clois DupesBoyette, Alayasia Breeding Godwin 04/20/2014, 12:51 PM  I can be reached at [phone].

## 2014-04-20 NOTE — Progress Notes (Signed)
After several phone calls, CSW unable to get pt's FL2  Signed by MD this pm.  Baldo Dauballed Brock, admissions coordinator, [facility], who agreed to let pt come this pm, with the understanding that the Unit CSW will get it signed first thing in the am and have it sent to the facility.  Unit CSW, Eileen StanfordJenna, to be informed.  RN called and informed that pt could d/c to [facility].

## 2014-04-20 NOTE — Clinical Social Work Placement (Signed)
Clinical Social Work Department CLINICAL SOCIAL WORK PLACEMENT NOTE 04/20/2014  Patient:  [name],[name]  Account Number:  [id number] Admit date:  04/19/2014  Clinical Social Worker:  Merlyn LotJENNA HOLOMAN, CLINICAL SOCIAL WORKER  Date/time:  04/20/2014 02:00 PM  Clinical Social Work is seeking post-discharge placement for this patient at the following level of care:   SKILLED NURSING   (*CSW will update this form in Epic as items are completed)   04/20/2014  Patient/family provided with [facility] Department of Clinical Social Work's list of facilities offering this level of care within the geographic area requested by the patient (or if unable, by the patient's family).  04/20/2014  Patient/family informed of their freedom to choose among providers that offer the needed level of care, that participate in Medicare, Medicaid or managed care program needed by the patient, have an available bed and are willing to accept the patient.  04/20/2014  Patient/family informed of [facility]' ownership interest in [facility], as well as of the fact that they are under no obligation to receive care at this facility.  PASARR submitted to EDS on 04/20/2014 PASARR number received on 04/20/2014  FL2 transmitted to all facilities in geographic area requested by pt/family on  04/20/2014 FL2 transmitted to all facilities within larger geographic area on 04/20/2014  Patient informed that his/her managed care company has contracts with or will negotiate with  certain facilities, including the following:     Patient/family informed of bed offers received:  04/20/2014 [facility] Physician recommends and patient chooses bed at    [facility] on  04/20/2014 Patient to be transferred to facility by family Patient and family notified of transfer on 04/20/2014 Name of family member notified:  Onalee Huaavid  The following physician  request were entered in Epic:   Additional Comments: [facility] until [place] at which point patient will be [facility].   Merlyn LotJenna Holoman, LCSWA Clinical Social Worker [phone]

## 2014-04-20 NOTE — Clinical Social Work Note (Signed)
[facility] Anticipated discharge date:04/20/14 Family notified: 04/20/14 Onalee Hua(David) Transportation by family  CSW signing off.  Merlyn LotJenna Holoman, LCSWA Clinical Social Worker [phone]

## 2014-04-20 NOTE — Discharge Summary (Signed)
Physician Discharge Summary  Patient ID: [name] MRN: [id number] DOB/AGE: 57-Feb-1957 57 y.o.  Admit date: 04/19/2014 Discharge date: 04/20/2014  Admission Diagnoses: Osteomyelitis abscess ulceration left ankle.  Discharge Diagnoses: Osteomyelitis abscess ulceration left ankle Active Problems:   S/P BKA (below knee amputation) unilateral   Discharged Condition: stable  Hospital Course: Patient's hospital course was essentially unremarkable. She underwent a transtibial amputation. Patient requires discharge to skilled nursing and arrangements were made for discharge to skilled nursing.  Consults: None  Significant Diagnostic Studies: labs: Routine labs  Treatments: surgery: See operative note  Discharge Exam: Blood pressure 110/57, pulse 72, temperature 97.8 F (36.6 C), temperature source Oral, resp. rate 18, height 5\' 2"  (1.575 m), weight 81.194 kg (179 lb), SpO2 97.00%. Incision/Wound: dressing clean dry and intact  Disposition: 06-Home-Health Care Svc     Medication List    ASK your doctor about these medications       acetaminophen 500 MG tablet  Commonly known as:  TYLENOL  Take 1,000 mg by mouth every 6 (six) hours as needed for moderate pain.     alprazolam 2 MG tablet  Commonly known as:  XANAX  Take 2 mg by mouth every 6 (six) hours as needed for sleep or anxiety.     aspirin 81 MG chewable tablet  Chew 1 tablet (81 mg total) by mouth daily.     atorvastatin 80 MG tablet  Commonly known as:  LIPITOR  Take 1 tablet (80 mg total) by mouth daily at 6 PM.     bisoprolol 5 MG tablet  Commonly known as:  ZEBETA  Take 1 tablet (5 mg total) by mouth daily.     clopidogrel 75 MG tablet  Commonly known as:  PLAVIX  Take 1 tablet (75 mg total) by mouth daily.     diclofenac sodium 1 % Gel  Commonly known as:  VOLTAREN  Apply 2 g topically 2 (two) times daily.     DSS 100 MG Caps  Take 100 mg by mouth 2 (two) times daily.     escitalopram 10 MG  tablet  Commonly known as:  LEXAPRO  Take 1 tablet (10 mg total) by mouth daily.     fentaNYL 75 MCG/HR  Commonly known as:  DURAGESIC - dosed mcg/hr  Place 75 mcg onto the skin every 3 (three) days.     fluticasone 50 MCG/ACT nasal spray  Commonly known as:  FLONASE  Place 2 sprays into both nostrils daily as needed for allergies.     furosemide 40 MG tablet  Commonly known as:  LASIX  Take 1 tablet (40 mg total) by mouth daily.     gabapentin 400 MG capsule  Commonly known as:  NEURONTIN  Take 1 capsule (400 mg total) by mouth 3 (three) times daily.     glimepiride 2 MG tablet  Commonly known as:  AMARYL  Take 1 tablet (2 mg total) by mouth 2 (two) times daily.     losartan 25 MG tablet  Commonly known as:  COZAAR  Take 1 tablet (25 mg total) by mouth at bedtime.     metFORMIN 1000 MG tablet  Commonly known as:  GLUCOPHAGE  Take 1 tablet (1,000 mg total) by mouth 2 (two) times daily with a meal.     methocarbamol 500 MG tablet  Commonly known as:  ROBAXIN  Take 1 tablet (500 mg total) by mouth every 6 (six) hours as needed for muscle spasms.  nitroGLYCERIN 0.4 mg/hr patch  Commonly known as:  NITRODUR - Dosed in mg/24 hr  Place 1 patch (0.4 mg total) onto the skin daily.     oxycodone 30 MG immediate release tablet  Commonly known as:  ROXICODONE  Take 30 mg by mouth every 4 (four) hours as needed for pain.     protein supplement Powd  Take 6 g by mouth 3 (three) times daily with meals.     spironolactone 25 MG tablet  Commonly known as:  ALDACTONE  Take 0.5 tablets (12.5 mg total) by mouth daily.           Follow-up Information   Follow up with Colbe Viviano V, MD In 2 weeks.   Specialty:  Orthopedic Surgery   Contact information:   [address] [place] [address] [phone]       Signed: Nadara MustardDUDA,Oni Dietzman V 04/20/2014, 7:11 AM

## 2014-04-20 NOTE — Progress Notes (Signed)
Patient ID: Kaitlin RainsCheryl C [name], female   DOB: [birth year], 57 y.o.   MRN: [id number] Postoperative day one transtibial amputation on the left. Patient will require discharge to skilled nursing. Discharge orders and summary dictated.

## 2014-04-20 NOTE — Discharge Instructions (Signed)
Keep dressing clean dry and intact. May change of dressing becomes soiled.

## 2014-04-20 NOTE — Progress Notes (Signed)
Report was given to Tawana ScaleBetty Wilson at [facility].

## 2014-04-20 NOTE — Clinical Social Work Note (Signed)
Clinical Social Work Department BRIEF PSYCHOSOCIAL ASSESSMENT 04/20/2014  Patient:  [name],[name]     Account Number:  [id number]     Admit date:  04/19/2014  Clinical Social Worker:  [name],[name], CLINICAL SOCIAL WORKER  Date/Time:  04/20/2014 09:30 AM  Referred by:  Physician  Date Referred:  04/20/2014 Referred for  SNF Placement   Other Referral:   Interview type:  Patient Other interview type:    PSYCHOSOCIAL DATA Living Status:  FAMILY Admitted from facility:   Level of care:   Primary support name:  Onalee HuaDavid Primary support relationship to patient:  CHILD, ADULT Degree of support available:   Patient has high level of support from son who patient has been living with.  Son reports that he has been providing a great deal of patients healthcare at home.    CURRENT CONCERNS Current Concerns  Post-Acute Placement   Other Concerns:    SOCIAL WORK ASSESSMENT / PLAN CSW spoke with patient concerning SNF placement.  Patient is agreeable to bed search.  CSW explained that patient will need a LOG for patient which will restrict where patient is able to go.  CSW will continue to follow.   Assessment/plan status:  Psychosocial Support/Ongoing Assessment of Needs Other assessment/ plan:   FL2  PASAR   Information/referral to community resources:   [place] [place] [place] [place] [facility]    PATIENT'S/FAMILY'S RESPONSE TO PLAN OF CARE: Patient is agreeable to SNF placement and hopes that patient will return to home soon.       Merlyn LotJenna Holoman, LCSWA Clinical Social Worker [phone]

## 2014-04-20 NOTE — Clinical Social Work Note (Signed)
CSW [place].    CSW will continue to follow.  Merlyn LotJenna Holoman, LCSWA Clinical Social Worker [phone]

## 2014-04-20 NOTE — Evaluation (Signed)
Physical Therapy Evaluation Patient Details Name: [name] MRN: [id number] DOB: 1955-11-19 Today's Date: 04/20/2014   History of Present Illness  58 y.o. female s/p left transtibial amputation.  Clinical Impression  Patient is seen following the above procedure and presents with functional limitations due to the deficits listed below (see PT Problem List). Limited due to fatigue and weak upper extremities during transfer today. Max assist during pivot due to prematurely attempting to sit in chair however min assist to stand from lowest bed setting. Able to tolerate therapeutic exercises well. Patient will benefit from skilled PT to increase their independence and safety with mobility to allow discharge to the venue listed below.      Follow Up Recommendations CIR;Other (comment) (IF CIR not an option will need SNF)    Equipment Recommendations   (TBD)    Recommendations for Other Services Rehab consult;OT consult     Precautions / Restrictions Precautions Precautions: Fall Restrictions Weight Bearing Restrictions: Yes LLE Weight Bearing: Non weight bearing      Mobility  Bed Mobility Overal bed mobility: Needs Assistance Bed Mobility: Supine to Sit     Supine to sit: Min guard;HOB elevated     General bed mobility comments: Min guard for safety with VC for technique. Use of bed rail  Transfers Overall transfer level: Needs assistance Equipment used: Rolling walker (2 wheeled) Transfers: Sit to/from [employer];Stand Pivot Transfers Sit to Stand: Min assist Stand pivot transfers: Max assist       General transfer comment: Min assist for boost to stand from lowest bed setting, VC for hand placement and anterior weight shift. Max assist for pivot transfer to chair with assistance for walker placement. Pt attempted to sit prematurely and required max assist to maintain standing position before she was safely aligned above chair to sit. She has difficulty sequencing walker  placement and using UEs to allow pivot on RLE.  Ambulation/Gait                Stairs            Wheelchair Mobility    Modified Rankin (Stroke Patients Only)       Balance Overall balance assessment: Needs assistance Sitting-balance support: No upper extremity supported;Feet supported Sitting balance-Leahy Scale: Fair     Standing balance support: Bilateral upper extremity supported Standing balance-Leahy Scale: Poor                               Pertinent Vitals/Pain Pain Assessment: 0-10 Pain Score: 6  Pain Location: Lt residual limb Pain Descriptors / Indicators: Throbbing Pain Intervention(s): Monitored during session;Repositioned    Home Living Family/patient expects to be discharged to:: Unsure Living Arrangements: Children Available Help at Discharge: Family;Available 24 hours/day Type of Home: House Home Access: Level entry     Home Layout: One level Home Equipment: Walker - 2 wheels;Shower seat;Cane - single point;Adaptive equipment;Bedside commode Additional Comments: Son lives next door, not working and can help at DC (son has 2 steps with 2 rails)    Prior Function Level of Independence: Independent with assistive device(s)         Comments: ambulating with RW prior to surgery     Hand Dominance   Dominant Hand: Left    Extremity/Trunk Assessment   Upper Extremity Assessment: Defer to OT evaluation           Lower Extremity Assessment: LLE deficits/detail   LLE Deficits /  Details: decreased strength and ROM as expected post op BKA     Communication   Communication: No difficulties  Cognition Arousal/Alertness: Awake/alert Behavior During Therapy: WFL for tasks assessed/performed Overall Cognitive Status: Within Functional Limits for tasks assessed                      General Comments General comments (skin integrity, edema, etc.): Mild drainage from LLE at surgical site on bed pad.     Exercises Amputee Exercises Quad Sets: AROM;Left;5 reps;Seated Gluteal Sets: Strengthening;Both;10 reps;Seated Hip Extension: AROM;Left;10 reps;Standing Hip ABduction/ADduction: 10 reps;Left;Strengthening;Supine Hip Flexion/Marching: Strengthening;Left;10 reps;Standing      Assessment/Plan    PT Assessment Patient needs continued PT services  PT Diagnosis Generalized weakness;Difficulty walking;Acute pain   PT Problem List Decreased strength;Decreased range of motion;Decreased activity tolerance;Decreased balance;Decreased mobility;Decreased knowledge of use of DME;Decreased knowledge of precautions;Pain;Obesity  PT Treatment Interventions DME instruction;Gait training;Functional mobility training;Therapeutic activities;Therapeutic exercise;Balance training;Neuromuscular re-education;Patient/family education;Modalities;Wheelchair mobility training   PT Goals (Current goals can be found in the Care Plan section) Acute Rehab PT Goals Patient Stated Goal: Get better PT Goal Formulation: With patient Time For Goal Achievement: 04/27/14 Potential to Achieve Goals: Good    Frequency Min 5X/week   Barriers to discharge        Co-evaluation               End of Session Equipment Utilized During Treatment: Gait belt Activity Tolerance: Patient limited by fatigue Patient left: in chair;with call bell/phone within reach Nurse Communication: Mobility status;Other (comment) (+2 assist for transfer back to bed)         Time: 1007-1030 PT Time Calculation (min): 23 min   Charges:   PT Evaluation $Initial PT Evaluation Tier I: 1 Procedure PT Treatments $Therapeutic Activity: 8-22 mins   PT G Codes:        [name], [place] [phone]   [name], [name] 04/20/2014, 11:14 AM

## 2014-04-21 ENCOUNTER — Inpatient Hospital Stay (HOSPITAL_COMMUNITY)
Admission: EM | Admit: 2014-04-21 | Discharge: 2014-04-24 | DRG: 812 | Disposition: A | Discharge status: Skilled Nursing Facility | Payer: BC Managed Care – PPO | Attending: Orthopedic Surgery | Admitting: Orthopedic Surgery

## 2014-04-21 ENCOUNTER — Encounter (HOSPITAL_COMMUNITY): Payer: Self-pay | Admitting: Emergency Medicine

## 2014-04-21 ENCOUNTER — Emergency Department (HOSPITAL_COMMUNITY): Payer: BC Managed Care – PPO

## 2014-04-21 DIAGNOSIS — Z89512 Acquired absence of left leg below knee: Secondary | ICD-10-CM | POA: Diagnosis not present

## 2014-04-21 DIAGNOSIS — Z880 Allergy status to penicillin: Secondary | ICD-10-CM

## 2014-04-21 DIAGNOSIS — M1990 Unspecified osteoarthritis, unspecified site: Secondary | ICD-10-CM | POA: Diagnosis present

## 2014-04-21 DIAGNOSIS — E785 Hyperlipidemia, unspecified: Secondary | ICD-10-CM | POA: Diagnosis present

## 2014-04-21 DIAGNOSIS — F329 Major depressive disorder, single episode, unspecified: Secondary | ICD-10-CM | POA: Diagnosis present

## 2014-04-21 DIAGNOSIS — Z79891 Long term (current) use of opiate analgesic: Secondary | ICD-10-CM | POA: Diagnosis not present

## 2014-04-21 DIAGNOSIS — I739 Peripheral vascular disease, unspecified: Secondary | ICD-10-CM | POA: Diagnosis present

## 2014-04-21 DIAGNOSIS — I10 Essential (primary) hypertension: Secondary | ICD-10-CM | POA: Diagnosis present

## 2014-04-21 DIAGNOSIS — Z7982 Long term (current) use of aspirin: Secondary | ICD-10-CM | POA: Diagnosis not present

## 2014-04-21 DIAGNOSIS — Z7902 Long term (current) use of antithrombotics/antiplatelets: Secondary | ICD-10-CM

## 2014-04-21 DIAGNOSIS — Z794 Long term (current) use of insulin: Secondary | ICD-10-CM

## 2014-04-21 DIAGNOSIS — E119 Type 2 diabetes mellitus without complications: Secondary | ICD-10-CM | POA: Diagnosis present

## 2014-04-21 DIAGNOSIS — E669 Obesity, unspecified: Secondary | ICD-10-CM | POA: Diagnosis present

## 2014-04-21 DIAGNOSIS — I2510 Atherosclerotic heart disease of native coronary artery without angina pectoris: Secondary | ICD-10-CM | POA: Diagnosis present

## 2014-04-21 DIAGNOSIS — Z6832 Body mass index (BMI) 32.0-32.9, adult: Secondary | ICD-10-CM

## 2014-04-21 DIAGNOSIS — Z79899 Other long term (current) drug therapy: Secondary | ICD-10-CM | POA: Diagnosis not present

## 2014-04-21 DIAGNOSIS — D62 Acute posthemorrhagic anemia: Secondary | ICD-10-CM | POA: Diagnosis present

## 2014-04-21 DIAGNOSIS — J449 Chronic obstructive pulmonary disease, unspecified: Secondary | ICD-10-CM | POA: Diagnosis present

## 2014-04-21 DIAGNOSIS — I959 Hypotension, unspecified: Secondary | ICD-10-CM

## 2014-04-21 DIAGNOSIS — F17209 Nicotine dependence, unspecified, with unspecified nicotine-induced disorders: Secondary | ICD-10-CM | POA: Diagnosis present

## 2014-04-21 DIAGNOSIS — I252 Old myocardial infarction: Secondary | ICD-10-CM

## 2014-04-21 DIAGNOSIS — Z87891 Personal history of nicotine dependence: Secondary | ICD-10-CM

## 2014-04-21 DIAGNOSIS — D649 Anemia, unspecified: Secondary | ICD-10-CM

## 2014-04-21 HISTORY — DX: Anemia, unspecified: D64.9

## 2014-04-21 LAB — CBC WITH DIFFERENTIAL/PLATELET
Basophils Absolute: 0 10*3/uL (ref 0.0–0.1)
Basophils Relative: 0 % (ref 0–1)
EOS PCT: 0 % (ref 0–5)
Eosinophils Absolute: 0 10*3/uL (ref 0.0–0.7)
HCT: 17.5 % — ABNORMAL LOW (ref 36.0–46.0)
HEMOGLOBIN: 5.4 g/dL — AB (ref 12.0–15.0)
LYMPHS ABS: 2 10*3/uL (ref 0.7–4.0)
Lymphocytes Relative: 19 % (ref 12–46)
MCH: 25 pg — AB (ref 26.0–34.0)
MCHC: 30.9 g/dL (ref 30.0–36.0)
MCV: 81 fL (ref 78.0–100.0)
MONO ABS: 0.8 10*3/uL (ref 0.1–1.0)
Monocytes Relative: 7 % (ref 3–12)
Neutro Abs: 7.9 10*3/uL — ABNORMAL HIGH (ref 1.7–7.7)
Neutrophils Relative %: 74 % (ref 43–77)
Platelets: 399 10*3/uL (ref 150–400)
RBC: 2.16 MIL/uL — ABNORMAL LOW (ref 3.87–5.11)
RDW: 21.7 % — ABNORMAL HIGH (ref 11.5–15.5)
WBC: 10.7 10*3/uL — ABNORMAL HIGH (ref 4.0–10.5)

## 2014-04-21 LAB — BASIC METABOLIC PANEL
Anion gap: 17 — ABNORMAL HIGH (ref 5–15)
BUN: 17 mg/dL (ref 6–23)
CO2: 20 meq/L (ref 19–32)
CREATININE: 0.53 mg/dL (ref 0.50–1.10)
Calcium: 8.7 mg/dL (ref 8.4–10.5)
Chloride: 96 mEq/L (ref 96–112)
GFR calc Af Amer: >90 mL/min (ref >90)
GFR calc non Af Amer: >90 mL/min (ref >90)
Glucose, Bld: 248 mg/dL — ABNORMAL HIGH (ref 70–99)
Potassium: 5.2 mEq/L (ref 3.7–5.3)
Sodium: 133 mEq/L — ABNORMAL LOW (ref 137–147)

## 2014-04-21 LAB — PROTIME-INR
INR: 1.19 (ref 0.00–1.49)
PROTHROMBIN TIME: 15.2 s (ref 11.6–15.2)

## 2014-04-21 LAB — GLUCOSE, CAPILLARY
GLUCOSE-CAPILLARY: 139 mg/dL — AB (ref 70–99)
GLUCOSE-CAPILLARY: 121 mg/dL — AB (ref 70–99)

## 2014-04-21 LAB — URINALYSIS, ROUTINE W REFLEX MICROSCOPIC
Bilirubin Urine: NEGATIVE
GLUCOSE, UA: NEGATIVE mg/dL
Hgb urine dipstick: NEGATIVE
Ketones, ur: NEGATIVE mg/dL
Leukocytes, UA: NEGATIVE
Nitrite: NEGATIVE
Protein, ur: NEGATIVE mg/dL
SPECIFIC GRAVITY, URINE: 1.02 (ref 1.005–1.030)
UROBILINOGEN UA: 0.2 mg/dL (ref 0.0–1.0)
pH: 5 (ref 5.0–8.0)

## 2014-04-21 LAB — PREPARE RBC (CROSSMATCH)

## 2014-04-21 LAB — I-STAT CG4 LACTIC ACID, ED: Lactic Acid, Venous: 4.88 mmol/L — ABNORMAL HIGH (ref 0.5–2.2)

## 2014-04-21 LAB — APTT: aPTT: 29 seconds (ref 24–37)

## 2014-04-21 LAB — TROPONIN I: Troponin I: <0.3 ng/mL (ref <0.30)

## 2014-04-21 MED ORDER — ATORVASTATIN CALCIUM 80 MG PO TABS
80.0000 mg | ORAL_TABLET | Freq: Every day | ORAL | Status: DC
  Administered 2014-04-21 – 2014-04-23 (×3): 80 mg via ORAL
  Filled 2014-04-21 (×4): qty 1

## 2014-04-21 MED ORDER — LOSARTAN POTASSIUM 25 MG PO TABS
25.0000 mg | ORAL_TABLET | Freq: Every day | ORAL | Status: DC
  Administered 2014-04-21 – 2014-04-24 (×4): 25 mg via ORAL
  Filled 2014-04-21 (×4): qty 1

## 2014-04-21 MED ORDER — OXYCODONE HCL 5 MG PO TABS
30.0000 mg | ORAL_TABLET | ORAL | Status: DC | PRN
  Administered 2014-04-21 – 2014-04-24 (×10): 30 mg via ORAL
  Filled 2014-04-21 (×11): qty 6

## 2014-04-21 MED ORDER — ALPRAZOLAM 0.5 MG PO TABS
2.0000 mg | ORAL_TABLET | Freq: Four times a day (QID) | ORAL | Status: DC | PRN
  Administered 2014-04-23: 2 mg via ORAL
  Filled 2014-04-21: qty 4

## 2014-04-21 MED ORDER — ESCITALOPRAM OXALATE 10 MG PO TABS
10.0000 mg | ORAL_TABLET | Freq: Every day | ORAL | Status: DC
  Administered 2014-04-22 – 2014-04-24 (×3): 10 mg via ORAL
  Filled 2014-04-21 (×4): qty 1

## 2014-04-21 MED ORDER — INSULIN ASPART 100 UNIT/ML ~~LOC~~ SOLN
0.0000 [IU] | Freq: Three times a day (TID) | SUBCUTANEOUS | Status: DC
  Administered 2014-04-21: 2 [IU] via SUBCUTANEOUS
  Administered 2014-04-22: 1 [IU] via SUBCUTANEOUS
  Administered 2014-04-23: 5 [IU] via SUBCUTANEOUS

## 2014-04-21 MED ORDER — ASPIRIN 81 MG PO CHEW
81.0000 mg | CHEWABLE_TABLET | Freq: Every day | ORAL | Status: DC
  Administered 2014-04-22 – 2014-04-24 (×3): 81 mg via ORAL
  Filled 2014-04-21 (×3): qty 1

## 2014-04-21 MED ORDER — SODIUM CHLORIDE 0.9 % IV BOLUS (SEPSIS)
1000.0000 mL | Freq: Once | INTRAVENOUS | Status: AC
  Administered 2014-04-21: 1000 mL via INTRAVENOUS

## 2014-04-21 MED ORDER — FLUTICASONE PROPIONATE 50 MCG/ACT NA SUSP
2.0000 | Freq: Every day | NASAL | Status: DC
  Filled 2014-04-21: qty 16

## 2014-04-21 MED ORDER — FUROSEMIDE 10 MG/ML IJ SOLN
20.0000 mg | Freq: Once | INTRAMUSCULAR | Status: AC
  Administered 2014-04-21: 20 mg via INTRAVENOUS
  Filled 2014-04-21: qty 2

## 2014-04-21 MED ORDER — FENTANYL 50 MCG/HR TD PT72: 75.0000 ug | MEDICATED_PATCH | TRANSDERMAL | Status: DC

## 2014-04-21 MED ORDER — CLOPIDOGREL BISULFATE 75 MG PO TABS
75.0000 mg | ORAL_TABLET | Freq: Every day | ORAL | Status: DC
  Administered 2014-04-22 – 2014-04-24 (×3): 75 mg via ORAL
  Filled 2014-04-21 (×3): qty 1

## 2014-04-21 MED ORDER — FUROSEMIDE 40 MG PO TABS
40.0000 mg | ORAL_TABLET | Freq: Every day | ORAL | Status: DC
  Administered 2014-04-22 – 2014-04-24 (×3): 40 mg via ORAL
  Filled 2014-04-21 (×4): qty 1

## 2014-04-21 MED ORDER — METFORMIN HCL 500 MG PO TABS
1000.0000 mg | ORAL_TABLET | Freq: Two times a day (BID) | ORAL | Status: DC
  Administered 2014-04-21 – 2014-04-24 (×6): 1000 mg via ORAL
  Filled 2014-04-21 (×8): qty 2

## 2014-04-21 MED ORDER — DIPHENHYDRAMINE HCL 25 MG PO CAPS
25.0000 mg | ORAL_CAPSULE | Freq: Once | ORAL | Status: AC
  Administered 2014-04-21: 25 mg via ORAL
  Filled 2014-04-21: qty 1

## 2014-04-21 MED ORDER — BISOPROLOL FUMARATE 5 MG PO TABS
5.0000 mg | ORAL_TABLET | Freq: Every day | ORAL | Status: DC
  Administered 2014-04-22 – 2014-04-24 (×3): 5 mg via ORAL
  Filled 2014-04-21 (×4): qty 1

## 2014-04-21 MED ORDER — GABAPENTIN 800 MG PO TABS
400.0000 mg | ORAL_TABLET | Freq: Three times a day (TID) | ORAL | Status: DC
  Administered 2014-04-21: 400 mg via ORAL
  Filled 2014-04-21 (×2): qty 0.5

## 2014-04-21 MED ORDER — DOCUSATE SODIUM 100 MG PO CAPS
100.0000 mg | ORAL_CAPSULE | Freq: Two times a day (BID) | ORAL | Status: DC
  Administered 2014-04-21 – 2014-04-24 (×6): 100 mg via ORAL
  Filled 2014-04-21 (×7): qty 1

## 2014-04-21 MED ORDER — HYDROMORPHONE HCL 1 MG/ML IJ SOLN
0.5000 mg | INTRAMUSCULAR | Status: AC | PRN
  Administered 2014-04-21: 0.5 mg via INTRAVENOUS
  Filled 2014-04-21: qty 1

## 2014-04-21 MED ORDER — GLIMEPIRIDE 2 MG PO TABS
2.0000 mg | ORAL_TABLET | Freq: Every day | ORAL | Status: DC
  Administered 2014-04-22 – 2014-04-24 (×3): 2 mg via ORAL
  Filled 2014-04-21 (×4): qty 1

## 2014-04-21 MED ORDER — SODIUM CHLORIDE 0.9 % IV SOLN
Freq: Once | INTRAVENOUS | Status: AC
  Administered 2014-04-21: 13:00:00 via INTRAVENOUS

## 2014-04-21 MED ORDER — BOOST / RESOURCE BREEZE PO LIQD
1.0000 | Freq: Three times a day (TID) | ORAL | Status: DC
  Administered 2014-04-21 – 2014-04-24 (×8): 1 via ORAL

## 2014-04-21 MED ORDER — ONDANSETRON HCL 4 MG/2ML IJ SOLN
4.0000 mg | Freq: Once | INTRAMUSCULAR | Status: AC
  Administered 2014-04-21: 4 mg via INTRAVENOUS
  Filled 2014-04-21: qty 2

## 2014-04-21 MED ORDER — HYDROMORPHONE HCL 1 MG/ML IJ SOLN
0.5000 mg | Freq: Once | INTRAMUSCULAR | Status: AC
  Administered 2014-04-21: 0.5 mg via INTRAVENOUS
  Filled 2014-04-21: qty 1

## 2014-04-21 MED ORDER — ACETAMINOPHEN 325 MG PO TABS
650.0000 mg | ORAL_TABLET | Freq: Once | ORAL | Status: AC
  Administered 2014-04-21: 650 mg via ORAL
  Filled 2014-04-21: qty 2

## 2014-04-21 MED ORDER — ONDANSETRON HCL 4 MG/2ML IJ SOLN: 4.0000 mg | Freq: Three times a day (TID) | INTRAMUSCULAR | Status: AC | PRN

## 2014-04-21 MED ORDER — INSULIN ASPART 100 UNIT/ML ~~LOC~~ SOLN
4.0000 [IU] | Freq: Three times a day (TID) | SUBCUTANEOUS | Status: DC
  Administered 2014-04-21 – 2014-04-24 (×2): 4 [IU] via SUBCUTANEOUS

## 2014-04-21 MED ORDER — SPIRONOLACTONE 12.5 MG HALF TABLET
12.5000 mg | ORAL_TABLET | Freq: Every day | ORAL | Status: DC
Start: 2014-04-21 — End: 2014-04-24
  Administered 2014-04-23 – 2014-04-24 (×2): 12.5 mg via ORAL
  Filled 2014-04-21 (×4): qty 1

## 2014-04-21 NOTE — ED Notes (Signed)
Pt given .5 dilaudid and 4mg  of zofran. Tolerated well. BP 113/58 at this time. Pt resting watching tv with sister at bedside.

## 2014-04-21 NOTE — ED Notes (Signed)
[facility] to [facility], RN on 5N. Per RN request, will wait 10 minutes to transfer pt so they can get the room ready.

## 2014-04-21 NOTE — Clinical Social Work Note (Addendum)
CSW was informed by RN that patients blood pressure was increasing.  RN attempted to call Dr to check before patient DC.  CSW contacted Drs office and informed nurse that Fl2 needed to be signed and that patient needed to be examined for increase in blood pressure.  RN at Ortho office stated that Dr. Eulah CitizenWould come to examine patient and sign paperwork.  CSW informed Rn to DC patient after paper was signed and patient was medically clear- patient to be driven by family to facility.  DC packet and FL2 on patient chart.  CSW will continue to follow.  Merlyn LotJenna Holoman, LCSWA Clinical Social Worker [phone]

## 2014-04-21 NOTE — ED Notes (Signed)
X ray being completed at this time.

## 2014-04-21 NOTE — ED Notes (Signed)
Spoke with EDP not calling a code sepsis symptoms relating to hypovolemia.

## 2014-04-21 NOTE — ED Notes (Signed)
Surgical site re-bandaged with 4x4 and abd dressing. Ace wrapped. Pressure applied.

## 2014-04-21 NOTE — ED Notes (Signed)
Pt encouraged to urinate. Pt does not have to at this time. Will continue to encourage.

## 2014-04-21 NOTE — ED Notes (Addendum)
Pt complaining of knee pain 10/10. Will notify MD. BP up to 140/60 at this time. Pt tolerating blood well at this time.

## 2014-04-21 NOTE — ED Notes (Signed)
CRITICAL VALUE ALERT  Critical value received:  Hg 5.5  Date of notification:  04/21/2014  Time of notification: 1255  Critical value read back: yes  Nurse who received alert:  Harlow AsaHolley Roberson, RN  MD notified: Blinda LeatherwoodPollina, MD

## 2014-04-21 NOTE — ED Provider Notes (Addendum)
CSN: [id number]     Arrival date & time 04/21/14  1136 History   First MD Initiated Contact with Patient 04/21/14 1143     Chief Complaint  Patient presents with  . Post-op Problem     (Consider location/radiation/quality/duration/timing/severity/associated sxs/prior Treatment) HPI Comments: Patient presents to the ER for evaluation of bleeding from area of recent BKA. Patient was discharged yesterday after having a BKA performed on October 28. She is currently in rehabilitation. Patient started having bleeding from the stump today. There was reportedly a large amount of blood and clots present initially. Staff at the rehabilitation center wrapped it with [place] and and EMS report no obvious bleeding during transport. Patient reports severe pain in the area of the surgery. She feels weak. No shortness of breath or chest pain.   Past Medical History  Diagnosis Date  . Diabetes mellitus   . Peripheral vascular disease   . Hyperlipidemia   . Obesity   . Hypertension   . Arthritis   . Ulcer of ankle     right  . Coronary artery disease   . COPD (chronic obstructive pulmonary disease)   . NSTEMI (non-ST elevated myocardial infarction)   . Depression    Past Surgical History  Procedure Laterality Date  . Cholecystectomy    . Femoral-tibial bypass graft Right 09/27/2013    Procedure: BYPASS GRAFT FEMORAL-TIBIAL ARTERY-RIGHT;  Surgeon: Nada Libman, MD;  Location: [facility] OR;  Service: Vascular;  Laterality: Right;  . Abdominal aorta stent Right   . Bypass graft popliteal to tibial Right   . Coronary angioplasty with stent placement      proximcal RCA stent ~ 2001  . Amputation Right 10/14/2013    Procedure: AMPUTATION RAY;  Surgeon: Nadara Mustard, MD;  Location: [facility] OR;  Service: Orthopedics;  Laterality: Right;  Right 2nd Ray Amputation, [place] Graft Right Ankle  . Ankle surgery Left     pinnining for fracture  . I&d extremity Left 04/05/2014    Procedure: Irrigation and Debridement  left Ankle, Remove Deep Hardware, and Place Antibiotic Beads;  Surgeon: Nadara Mustard, MD;  Location: [facility] OR;  Service: Orthopedics;  Laterality: Left;  . Fracture surgery      Left ankle  . 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 Cancer Mother   . Heart disease Father   . Hyperlipidemia Father   . Hypertension Father   . Other Father     varicose veins  . Heart attack Father   . Heart disease Sister   . Hyperlipidemia Sister   . Hypertension Sister   . Other Sister     varicose veins  . Heart attack Sister   . Heart disease Brother   . Hypertension Brother   . Hyperlipidemia Brother   . Other Brother     varicose veins  . Heart attack Brother    History  Substance Use Topics  . Smoking status: Current Some Day Smoker -- 2.00 packs/day for 30 years    Types: Cigarettes  . Smokeless tobacco: Never Used     Comment: pt states that she is not quitting smoking and refused smoking cessation materials  . Alcohol Use: No   OB History   Grav Para Term Preterm Abortions TAB SAB Ect Mult Living                 Review of Systems  Constitutional:  Positive for fatigue.  Skin: Positive for wound.  All other systems reviewed and are negative.     Allergies  Adhesive and Penicillins  Home Medications   Prior to Admission medications   Medication Sig Start Date End Date Taking? Authorizing Provider  acetaminophen (TYLENOL) 500 MG tablet Take 1,000 mg by mouth every 6 (six) hours as needed for moderate pain.    Historical Provider, MD  alprazolam Prudy Feeler(XANAX) 2 MG tablet Take 2 mg by mouth every 6 (six) hours as needed for sleep or anxiety.  04/06/12   Historical Provider, MD  aspirin 81 MG chewable tablet Chew 1 tablet (81 mg total) by mouth daily. 03/27/14   Marinda ElkAbraham Feliz Ortiz, MD  aspirin EC 81 MG tablet Take 1 tablet (81 mg total) by mouth daily. 04/20/14   Nadara MustardMarcus Duda V, MD  atorvastatin (LIPITOR) 80 MG tablet  Take 1 tablet (80 mg total) by mouth daily at 6 PM. 04/11/14   Evlyn KannerPamela S Love, PA-C  bisoprolol (ZEBETA) 5 MG tablet Take 1 tablet (5 mg total) by mouth daily. 04/11/14   Jacquelynn CreePamela S Love, PA-C  clopidogrel (PLAVIX) 75 MG tablet Take 1 tablet (75 mg total) by mouth daily. 04/11/14   Jacquelynn CreePamela S Love, PA-C  diclofenac sodium (VOLTAREN) 1 % GEL Apply 2 g topically 2 (two) times daily.    Historical Provider, MD  docusate sodium 100 MG CAPS Take 100 mg by mouth 2 (two) times daily. 04/11/14   Evlyn KannerPamela S Love, PA-C  escitalopram (LEXAPRO) 10 MG tablet Take 1 tablet (10 mg total) by mouth daily. 04/11/14   Evlyn KannerPamela S Love, PA-C  fentaNYL (DURAGESIC - DOSED MCG/HR) 75 MCG/HR Place 75 mcg onto the skin every 3 (three) days.    Historical Provider, MD  fluticasone (FLONASE) 50 MCG/ACT nasal spray Place 2 sprays into both nostrils daily as needed for allergies.  07/21/13   Historical Provider, MD  furosemide (LASIX) 40 MG tablet Take 1 tablet (40 mg total) by mouth daily. 04/11/14   Jacquelynn CreePamela S Love, PA-C  gabapentin (NEURONTIN) 400 MG capsule Take 1 capsule (400 mg total) by mouth 3 (three) times daily. 04/11/14   Evlyn KannerPamela S Love, PA-C  glimepiride (AMARYL) 2 MG tablet Take 1 tablet (2 mg total) by mouth 2 (two) times daily. 04/11/14   Evlyn KannerPamela S Love, PA-C  losartan (COZAAR) 25 MG tablet Take 1 tablet (25 mg total) by mouth at bedtime. 04/11/14   Jacquelynn CreePamela S Love, PA-C  metFORMIN (GLUCOPHAGE) 1000 MG tablet Take 1 tablet (1,000 mg total) by mouth 2 (two) times daily with a meal. 04/11/14   Evlyn KannerPamela S Love, PA-C  methocarbamol (ROBAXIN) 500 MG tablet Take 1 tablet (500 mg total) by mouth every 6 (six) hours as needed for muscle spasms. 04/11/14   Jacquelynn CreePamela S Love, PA-C  nitroGLYCERIN (NITRODUR - DOSED IN MG/24 HR) 0.4 mg/hr patch Place 1 patch (0.4 mg total) onto the skin daily. 04/11/14   Jacquelynn CreePamela S Love, PA-C  oxycodone (ROXICODONE) 30 MG immediate release tablet Take 30 mg by mouth every 4 (four) hours as needed for pain.     Historical Provider, MD  oxyCODONE-acetaminophen (ROXICET) 5-325 MG per tablet Take 1 tablet by mouth every 4 (four) hours as needed for severe pain. 04/20/14   Nadara MustardMarcus Duda V, MD  oxyCODONE-acetaminophen (ROXICET) 5-325 MG per tablet Take 1 tablet by mouth every 4 (four) hours as needed for severe pain. 04/20/14   Nadara MustardMarcus Duda V, MD  protein supplement (RESOURCE BENEPROTEIN) POWD Take 6  g by mouth 3 (three) times daily with meals. 04/11/14   Jacquelynn Cree, PA-C  spironolactone (ALDACTONE) 25 MG tablet Take 0.5 tablets (12.5 mg total) by mouth daily. 04/11/14   Evlyn Kanner Love, PA-C   BP 102/53  Pulse 106  Temp(Src) 98.6 F (37 C) (Rectal)  Resp 16  Ht 5\' 2"  (1.575 m)  Wt 179 lb (81.194 kg)  BMI 32.73 kg/m2  SpO2 97% Physical Exam  Constitutional: She is oriented to person, place, and time. She appears well-developed and well-nourished. No distress.  HENT:  Head: Normocephalic and atraumatic.  Right Ear: Hearing normal.  Left Ear: Hearing normal.  Nose: Nose normal.  Mouth/Throat: Oropharynx is clear and moist and mucous membranes are normal.  Eyes: Conjunctivae and EOM are normal. Pupils are equal, round, and reactive to light.  Neck: Normal range of motion. Neck supple.  Cardiovascular: Regular rhythm, S1 normal and S2 normal.  Tachycardia present.  Exam reveals no gallop and no friction rub.   No murmur heard. Pulmonary/Chest: Effort normal and breath sounds normal. No respiratory distress. She exhibits no tenderness.  Abdominal: Soft. Normal appearance and bowel sounds are normal. There is no hepatosplenomegaly. There is no tenderness. There is no rebound, no guarding, no tenderness at McBurney's point and negative Murphy's sign. No hernia.  Musculoskeletal: Normal range of motion.       Legs: Neurological: She is alert and oriented to person, place, and time. She has normal strength. No cranial nerve deficit or sensory deficit. Coordination normal. GCS eye subscore is 4. GCS verbal  subscore is 5. GCS motor subscore is 6.  Skin: Skin is warm and intact. No rash noted. She is diaphoretic. No cyanosis.     Psychiatric: She has a normal mood and affect. Her speech is normal and behavior is normal. Thought content normal.    ED Course  Procedures (including critical care time) Labs Review Labs Reviewed  CBC WITH DIFFERENTIAL - Abnormal; Notable for the following:    WBC 10.7 (*)    RBC 2.16 (*)    Hemoglobin 5.4 (*)    HCT 17.5 (*)    MCH 25.0 (*)    RDW 21.7 (*)    All other components within normal limits  BASIC METABOLIC PANEL - Abnormal; Notable for the following:    Sodium 133 (*)    Glucose, Bld 248 (*)    Anion gap 17 (*)    All other components within normal limits  I-STAT CG4 LACTIC ACID, ED - Abnormal; Notable for the following:    Lactic Acid, Venous 4.88 (*)    All other components within normal limits  CULTURE, BLOOD (ROUTINE X 2)  CULTURE, BLOOD (ROUTINE X 2)  URINE CULTURE  TROPONIN I  PROTIME-INR  APTT  URINALYSIS, ROUTINE W REFLEX MICROSCOPIC  TYPE AND SCREEN    Imaging Review No results found.   EKG Interpretation   Date/Time:  Friday April 21 2014 11:41:12 EDT Ventricular Rate:  127 PR Interval:  132 QRS Duration: 86 QT Interval:  310 QTC Calculation: 451 R Axis:   -69 Text Interpretation:  Sinus tachycardia Inferior infarct, old Anterior  infarct, old Baseline wander in lead(s) II III aVL aVF V2 V6 No  significant change since last tracing , other than tachycardia Confirmed  by POLLINA  MD, CHRISTOPHER ([id number]) on 04/21/2014 12:59:29 PM      MDM   Final diagnoses:  Hypotension  Anemia due to blood loss, acute   Patient brought to the  emergency department by ambulance from nursing home for evaluation of postoperative bleeding. Patient had below-the-knee amputation 2 days ago and was noted to have a large amount of bleeding from the stump today. She was brought to the ER and was noted to be tachycardic and  hypotensive. Patient was mildly diaphoretic as well. She did not have any fever. Surgical site does not look infected, but she does have some persistent bleeding from the region.  Cardiac workup is unremarkable. She is not experiencing any chest pain. Patient's hemoglobin is found to be 5.4. She was 10 postoperatively. Patient will require hospitalization for blood transfusion.  Case was discussed briefly with Dr. Lajoyce Cornersuda, her orthopedic surgeon. He will consult. He recommended pressure dressing to the area.  Addendum: Discussed with Dr. David StallFeliz-Ortiz, hospitalist service. He declines to admit the patient because this is a postoperative problem. Dr. David StallFeliz-Ortiz has asked that Dr. Lajoyce Cornersuda admit the patient and medicine can consult. Discussed once again with Dr. Lajoyce Cornersuda. He will arrange for his covering physician to care for the patient, asked for admitting orders to be written.    Gilda Creasehristopher J. Pollina, MD 04/21/14 1316  Gilda Creasehristopher J. Pollina, MD 04/21/14 [id number]

## 2014-04-21 NOTE — ED Notes (Signed)
Pt resting at this time. Pt unable to use restroom so I/O will be completed.

## 2014-04-21 NOTE — ED Notes (Signed)
[facility] Per EMS pt was d/c yesterday. Pt had left BKA surgery Apr 19, 2014. Pt started noticing she was bleeding last night at the surgical site. This morning per pt she was bleeding a lot, and states there was lots of clots. Left knee is bandaged at this time and bleeding controlled. Vitals stable. Pt diaphoretic. Pt complaining of pain 9/10.

## 2014-04-21 NOTE — ED Notes (Addendum)
[facility]. Pt d/c yesterday for left BKA done on 04-19-2014. Pt states she noticed it start to bleed last night. Pt states this morning there "was a lot of blood" when the facility changed the dressing. Pt very diaphoretic at this time and tachycardic at 124 bpm. Other than that pt nonsymptomatic. Pt alert/oriented x4. Follows commands.

## 2014-04-21 NOTE — ED Notes (Signed)
Notified that blood is ready. Secretary to go receive blood from blood bank.

## 2014-04-21 NOTE — ED Notes (Addendum)
Pt changed into gown, 12 lead completed. Pt very diaphoretic at this time. Pt complaining of nausea, denies chest pain, denies SOB, denies dizziness. MD at bedside at this time removing dressing on leg. Left leg surgical site draining blood at this time. Clots noted on the dressing. Otherwise, incision and site look unremarkable, no signs of infection. Temp 98.5 at this time, hr 124, rr 21, o2 sats 100% on RA. Will continue to monitor.

## 2014-04-22 LAB — URINE CULTURE
Colony Count: NO GROWTH
Culture: NO GROWTH

## 2014-04-22 LAB — CBC
HCT: 23.9 % — ABNORMAL LOW (ref 36.0–46.0)
Hemoglobin: 8.2 g/dL — ABNORMAL LOW (ref 12.0–15.0)
MCH: 27 pg (ref 26.0–34.0)
MCHC: 34.3 g/dL (ref 30.0–36.0)
MCV: 78.6 fL (ref 78.0–100.0)
Platelets: 233 10*3/uL (ref 150–400)
RBC: 3.04 MIL/uL — ABNORMAL LOW (ref 3.87–5.11)
RDW: 17.2 % — AB (ref 11.5–15.5)
WBC: 8.4 10*3/uL (ref 4.0–10.5)

## 2014-04-22 LAB — TYPE AND SCREEN
ABO/RH(D): AB POS
Antibody Screen: NEGATIVE
UNIT DIVISION: 0
Unit division: 0
Unit division: 0

## 2014-04-22 LAB — GLUCOSE, CAPILLARY
GLUCOSE-CAPILLARY: 128 mg/dL — AB (ref 70–99)
GLUCOSE-CAPILLARY: 166 mg/dL — AB (ref 70–99)
Glucose-Capillary: 106 mg/dL — ABNORMAL HIGH (ref 70–99)
Glucose-Capillary: 119 mg/dL — ABNORMAL HIGH (ref 70–99)

## 2014-04-22 MED ORDER — GABAPENTIN 400 MG PO CAPS
400.0000 mg | ORAL_CAPSULE | Freq: Three times a day (TID) | ORAL | Status: DC
  Administered 2014-04-22 – 2014-04-24 (×8): 400 mg via ORAL
  Filled 2014-04-22 (×10): qty 1

## 2014-04-22 NOTE — Progress Notes (Signed)
Pt. dressing to LBKA  Changed with  two ABD pads and two ace wraps due to moderate amount of red blood noted on patient pad.

## 2014-04-22 NOTE — H&P (Signed)
[name] is an 58 y.o. female.   Chief Complaint:  Acute blood loss anemia post left BKA HPI:   58 yo female with a history of a recent left BKA.  Was sent to a SNF 2 days ago, but then returned to the ED yesterday significantly anemic.  Was transfused PRBC's upon admission.  Past Medical History  Diagnosis Date  . Diabetes mellitus   . Peripheral vascular disease   . Hyperlipidemia   . Obesity   . Hypertension   . Arthritis   . Ulcer of ankle     right  . Coronary artery disease   . COPD (chronic obstructive pulmonary disease)   . NSTEMI (non-ST elevated myocardial infarction)   . Depression   . Anemia 04/21/2014    DUE TO BLLOD LOSS     Past Surgical History  Procedure Laterality Date  . Cholecystectomy    . Femoral-tibial bypass graft Right 09/27/2013    Procedure: BYPASS GRAFT FEMORAL-TIBIAL ARTERY-RIGHT;  Surgeon: Serafina Mitchell, MD;  Location: [facility] OR;  Service: Vascular;  Laterality: Right;  . Abdominal aorta stent Right   . Bypass graft popliteal to tibial Right   . Coronary angioplasty with stent placement      proximcal RCA stent ~ 2001  . Amputation Right 10/14/2013    Procedure: AMPUTATION RAY;  Surgeon: Newt Minion, MD;  Location: [place];  Service: Orthopedics;  Laterality: Right;  Right 2nd Ray Amputation, [place] Graft Right Ankle  . Ankle surgery Left     pinnining for fracture  . I&d extremity Left 04/05/2014    Procedure: Irrigation and Debridement left Ankle, Remove Deep Hardware, and Place Antibiotic Beads;  Surgeon: Newt Minion, MD;  Location: [place];  Service: Orthopedics;  Laterality: Left;  . Fracture surgery      Left ankle  . Below knee leg amputation Left 04/19/2014    DR DUDA  . Amputation Left 04/19/2014    Procedure: Left Below Knee Amputation;  Surgeon: Newt Minion, MD;  Location: [place];  Service: Orthopedics;  Laterality: Left;    Family History  Problem Relation Age of Onset  . Cancer Mother   . Heart disease Father   .  Hyperlipidemia Father   . Hypertension Father   . Other Father     varicose veins  . Heart attack Father   . Heart disease Sister   . Hyperlipidemia Sister   . Hypertension Sister   . Other Sister     varicose veins  . Heart attack Sister   . Heart disease Brother   . Hypertension Brother   . Hyperlipidemia Brother   . Other Brother     varicose veins  . Heart attack Brother    Social History:  reports that she quit smoking about 4 weeks ago. Her smoking use included Cigarettes. She has a 60 pack-year smoking history. She has never used smokeless tobacco. She reports that she does not drink alcohol or use illicit drugs.  Allergies:  Allergies  Allergen Reactions  . Adhesive [Tape] Rash  . Penicillins Hives and Other (See Comments)    Tolerates cefepime    Medications Prior to Admission  Medication Sig Dispense Refill  . acetaminophen (TYLENOL) 500 MG tablet Take 1,000 mg by mouth every 6 (six) hours as needed for moderate pain.      Marland Kitchen alprazolam (XANAX) 2 MG tablet Take 2 mg by mouth every 6 (six) hours as needed for sleep or anxiety.       Marland Kitchen  aspirin 81 MG chewable tablet Chew 1 tablet (81 mg total) by mouth daily.  30 tablet  0  . atorvastatin (LIPITOR) 80 MG tablet Take 1 tablet (80 mg total) by mouth daily at 6 PM.  30 tablet  0  . clopidogrel (PLAVIX) 75 MG tablet Take 1 tablet (75 mg total) by mouth daily.  30 tablet  0  . diclofenac sodium (VOLTAREN) 1 % GEL Apply 2 g topically 2 (two) times daily.      Marland Kitchen docusate sodium (COLACE) 100 MG capsule Take 200 mg by mouth 2 (two) times daily.      Marland Kitchen escitalopram (LEXAPRO) 10 MG tablet Take 1 tablet (10 mg total) by mouth daily.  30 tablet  0  . fentaNYL (DURAGESIC - DOSED MCG/HR) 75 MCG/HR Place 75 mcg onto the skin every 3 (three) days.      . fluticasone (FLONASE) 50 MCG/ACT nasal spray Place 2 sprays into both nostrils daily as needed for allergies.       . furosemide (LASIX) 40 MG tablet Take 1 tablet (40 mg total) by mouth  daily.  30 tablet  0  . gabapentin (NEURONTIN) 400 MG capsule Take 1 capsule (400 mg total) by mouth 3 (three) times daily.  90 capsule  0  . glimepiride (AMARYL) 2 MG tablet Take 1 tablet (2 mg total) by mouth 2 (two) times daily.  30 tablet  0  . insulin aspart (NOVOLOG) 100 UNIT/ML injection Inject 0-15 Units into the skin 4 (four) times daily -  with meals and at bedtime. CBG 121-150 2 units, 151-200 3 units, 201-250 units, 251-300 8 units, 301-350 11 units, 351-400 15 units, >400 call MD      . losartan (COZAAR) 25 MG tablet Take 1 tablet (25 mg total) by mouth at bedtime.  30 tablet  0  . metFORMIN (GLUCOPHAGE) 1000 MG tablet Take 1 tablet (1,000 mg total) by mouth 2 (two) times daily with a meal.  60 tablet  0  . methocarbamol (ROBAXIN) 500 MG tablet Take 1 tablet (500 mg total) by mouth every 6 (six) hours as needed for muscle spasms.  60 tablet  0  . nitroGLYCERIN (NITRODUR - DOSED IN MG/24 HR) 0.4 mg/hr patch Place 1 patch (0.4 mg total) onto the skin daily.  30 patch  12  . oxycodone (ROXICODONE) 30 MG immediate release tablet Take 30 mg by mouth every 4 (four) hours as needed for pain.      . protein supplement (RESOURCE BENEPROTEIN) POWD Take 6 g by mouth 3 (three) times daily with meals.  227 g  0  . spironolactone (ALDACTONE) 25 MG tablet Take 0.5 tablets (12.5 mg total) by mouth daily.  30 tablet  0  . bisoprolol (ZEBETA) 5 MG tablet Take 1 tablet (5 mg total) by mouth daily.  30 tablet  0  . oxyCODONE-acetaminophen (ROXICET) 5-325 MG per tablet Take 1 tablet by mouth every 4 (four) hours as needed for severe pain.  60 tablet  0    Results for orders placed during the hospital encounter of 04/21/14 (from the past 48 hour(s))  CBC WITH DIFFERENTIAL     Status: Abnormal   Collection Time    04/21/14 12:06 PM      Result Value Ref Range   WBC 10.7 (*) 4.0 - 10.5 K/uL   RBC 2.16 (*) 3.87 - 5.11 MIL/uL   Hemoglobin 5.4 (*) 12.0 - 15.0 g/dL   Comment: REPEATED TO VERIFY  CRITICAL  RESULT CALLED TO, READ BACK BY AND VERIFIED WITH:     C.GROSE,RN 1255 04/21/14 BY V.WILKINS   HCT 17.5 (*) 36.0 - 46.0 %   MCV 81.0  78.0 - 100.0 fL   MCH 25.0 (*) 26.0 - 34.0 pg   MCHC 30.9  30.0 - 36.0 g/dL   RDW 21.7 (*) 11.5 - 15.5 %   Platelets 399  150 - 400 K/uL   Neutrophils Relative % 74  43 - 77 %   Lymphocytes Relative 19  12 - 46 %   Monocytes Relative 7  3 - 12 %   Eosinophils Relative 0  0 - 5 %   Basophils Relative 0  0 - 1 %   Neutro Abs 7.9 (*) 1.7 - 7.7 K/uL   Lymphs Abs 2.0  0.7 - 4.0 K/uL   Monocytes Absolute 0.8  0.1 - 1.0 K/uL   Eosinophils Absolute 0.0  0.0 - 0.7 K/uL   Basophils Absolute 0.0  0.0 - 0.1 K/uL   RBC Morphology POLYCHROMASIA PRESENT    BASIC METABOLIC PANEL     Status: Abnormal   Collection Time    04/21/14 12:06 PM      Result Value Ref Range   Sodium 133 (*) 137 - 147 mEq/L   Potassium 5.2  3.7 - 5.3 mEq/L   Chloride 96  96 - 112 mEq/L   CO2 20  19 - 32 mEq/L   Glucose, Bld 248 (*) 70 - 99 mg/dL   BUN 17  6 - 23 mg/dL   Creatinine, Ser 0.53  0.50 - 1.10 mg/dL   Calcium 8.7  8.4 - 10.5 mg/dL   GFR calc non Af Amer >90  >90 mL/min   GFR calc Af Amer >90  >90 mL/min   Comment: (NOTE)     The eGFR has been calculated using the CKD EPI equation.     This calculation has not been validated in all clinical situations.     eGFR's persistently <90 mL/min signify possible Chronic Kidney     Disease.   Anion gap 17 (*) 5 - 15  TROPONIN I     Status: None   Collection Time    04/21/14 12:06 PM      Result Value Ref Range   Troponin I <0.30  <0.30 ng/mL   Comment:            Due to the release kinetics of cTnI,     a negative result within the first hours     of the onset of symptoms does not rule out     myocardial infarction with certainty.     If myocardial infarction is still suspected,     repeat the test at appropriate intervals.  PROTIME-INR     Status: None   Collection Time    04/21/14 12:06 PM      Result Value Ref Range    Prothrombin Time 15.2  11.6 - 15.2 seconds   INR 1.19  0.00 - 1.49  APTT     Status: None   Collection Time    04/21/14 12:06 PM      Result Value Ref Range   aPTT 29  24 - 37 seconds  TYPE AND SCREEN     Status: None   Collection Time    04/21/14 12:06 PM      Result Value Ref Range   ABO/RH(D) AB POS     Antibody Screen NEG  Sample Expiration 04/24/2014     Unit Number [id number]     Blood Component Type RED CELLS,LR     Unit division 00     Status of Unit ISSUED     Transfusion Status OK TO TRANSFUSE     Crossmatch Result Compatible     Unit Number [id number]     Blood Component Type RED CELLS,LR     Unit division 00     Status of Unit ISSUED     Transfusion Status OK TO TRANSFUSE     Crossmatch Result Compatible     Unit Number [id number]     Blood Component Type RED CELLS,LR     Unit division 00     Status of Unit ISSUED     Transfusion Status OK TO TRANSFUSE     Crossmatch Result Compatible    I-STAT CG4 LACTIC ACID, ED     Status: Abnormal   Collection Time    04/21/14 12:21 PM      Result Value Ref Range   Lactic Acid, Venous 4.88 (*) 0.5 - 2.2 mmol/L  URINALYSIS, ROUTINE W REFLEX MICROSCOPIC     Status: None   Collection Time    04/21/14  1:29 PM      Result Value Ref Range   Color, Urine YELLOW  YELLOW   APPearance CLEAR  CLEAR   Specific Gravity, Urine 1.020  1.005 - 1.030   pH 5.0  5.0 - 8.0   Glucose, UA NEGATIVE  NEGATIVE mg/dL   Hgb urine dipstick NEGATIVE  NEGATIVE   Bilirubin Urine NEGATIVE  NEGATIVE   Ketones, ur NEGATIVE  NEGATIVE mg/dL   Protein, ur NEGATIVE  NEGATIVE mg/dL   Urobilinogen, UA 0.2  0.0 - 1.0 mg/dL   Nitrite NEGATIVE  NEGATIVE   Leukocytes, UA NEGATIVE  NEGATIVE   Comment: MICROSCOPIC NOT DONE ON URINES WITH NEGATIVE PROTEIN, BLOOD, LEUKOCYTES, NITRITE, OR GLUCOSE <1000 mg/dL.  PREPARE RBC (CROSSMATCH)     Status: None   Collection Time    04/21/14  2:00 PM      Result Value Ref Range   Order Confirmation ORDER  PROCESSED BY BLOOD BANK    GLUCOSE, CAPILLARY     Status: Abnormal   Collection Time    04/21/14  4:27 PM      Result Value Ref Range   Glucose-Capillary 139 (*) 70 - 99 mg/dL  GLUCOSE, CAPILLARY     Status: Abnormal   Collection Time    04/21/14  9:28 PM      Result Value Ref Range   Glucose-Capillary 121 (*) 70 - 99 mg/dL  CBC     Status: Abnormal   Collection Time    04/22/14  4:57 AM      Result Value Ref Range   WBC 8.4  4.0 - 10.5 K/uL   RBC 3.04 (*) 3.87 - 5.11 MIL/uL   Hemoglobin 8.2 (*) 12.0 - 15.0 g/dL   Comment: REPEATED TO VERIFY     POST TRANSFUSION SPECIMEN   HCT 23.9 (*) 36.0 - 46.0 %   MCV 78.6  78.0 - 100.0 fL   MCH 27.0  26.0 - 34.0 pg   MCHC 34.3  30.0 - 36.0 g/dL   RDW 17.2 (*) 11.5 - 15.5 %   Platelets 233  150 - 400 K/uL   Comment: REPEATED TO VERIFY  GLUCOSE, CAPILLARY     Status: Abnormal   Collection Time    04/22/14  6:36 AM  Result Value Ref Range   Glucose-Capillary 106 (*) 70 - 99 mg/dL   Dg Chest Port 1 View  04/21/2014   CLINICAL DATA:  Initial encounter for hypotension  EXAM: PORTABLE CHEST - 1 VIEW  COMPARISON:  03/17/2014.  FINDINGS: 1301 hrs. Subsegmental atelectasis seen at the left lung base. Lung volumes are low without edema or focal airspace consolidation. No pleural effusion. The bibasilar airspace disease and effusion seen previously has resolved in the interval. Right IJ central line is been removed since the prior study. Cardiopericardial silhouette is at upper limits of normal for size. Imaged bony structures of the thorax are intact. Posttraumatic deformity is noted in the left humeral head. Telemetry leads overlie the chest.  IMPRESSION: Low volumes with left base atelectasis.   Electronically Signed   By: Misty Stanley M.D.   On: 04/21/2014 13:18    ROS  Blood pressure 122/46, pulse 95, temperature 99.1 F (37.3 C), temperature source Oral, resp. rate 18, height $RemoveBe'5\' 2"'qrXaEIAqK$  (1.575 m), weight 81.194 kg (179 lb), SpO2  94.00%. Physical Exam  Constitutional: She appears well-nourished.  HENT:  Head: Normocephalic and atraumatic.  Eyes: EOM are normal. Pupils are equal, round, and reactive to light.  Cardiovascular: Tachycardia present.   Respiratory: Effort normal and breath sounds normal.  GI: Soft. Bowel sounds are normal.  Musculoskeletal:       Legs: Neurological: She is alert.  Skin: Skin is warm.  Psychiatric: She has a normal mood and affect.     Assessment/Plan Acute blood loss anemia s/p left BKA 1)  Continue to monitor vitals and H/H  Brayla Pat Y 04/22/2014, 9:14 AM

## 2014-04-23 LAB — CBC
HEMATOCRIT: 24.3 % — AB (ref 36.0–46.0)
Hemoglobin: 8.1 g/dL — ABNORMAL LOW (ref 12.0–15.0)
MCH: 26.8 pg (ref 26.0–34.0)
MCHC: 33.3 g/dL (ref 30.0–36.0)
MCV: 80.5 fL (ref 78.0–100.0)
Platelets: 223 10*3/uL (ref 150–400)
RBC: 3.02 MIL/uL — ABNORMAL LOW (ref 3.87–5.11)
RDW: 18 % — AB (ref 11.5–15.5)
WBC: 6.9 10*3/uL (ref 4.0–10.5)

## 2014-04-23 LAB — GLUCOSE, CAPILLARY
GLUCOSE-CAPILLARY: 210 mg/dL — AB (ref 70–99)
GLUCOSE-CAPILLARY: 105 mg/dL — AB (ref 70–99)
GLUCOSE-CAPILLARY: 136 mg/dL — AB (ref 70–99)

## 2014-04-23 NOTE — Progress Notes (Signed)
Patient ID: [name], female   DOB: [birth year], 58 y.o.   MRN: [id number] Vital signs remain stable and her H/H is the same as yesterday.  Her left BKA stump dressing is clean and dry.  Will check CBC in am tomorrow and if remains stable, can discharge to SNF.

## 2014-04-24 LAB — CBC
HCT: 24.8 % — ABNORMAL LOW (ref 36.0–46.0)
Hemoglobin: 8.1 g/dL — ABNORMAL LOW (ref 12.0–15.0)
MCH: 27.3 pg (ref 26.0–34.0)
MCHC: 32.7 g/dL (ref 30.0–36.0)
MCV: 83.5 fL (ref 78.0–100.0)
Platelets: 244 10*3/uL (ref 150–400)
RBC: 2.97 MIL/uL — ABNORMAL LOW (ref 3.87–5.11)
RDW: 18 % — ABNORMAL HIGH (ref 11.5–15.5)
WBC: 5.3 10*3/uL (ref 4.0–10.5)

## 2014-04-24 LAB — GLUCOSE, CAPILLARY
GLUCOSE-CAPILLARY: 107 mg/dL — AB (ref 70–99)
GLUCOSE-CAPILLARY: 82 mg/dL (ref 70–99)
Glucose-Capillary: 118 mg/dL — ABNORMAL HIGH (ref 70–99)

## 2014-04-24 MED ORDER — OXYCODONE-ACETAMINOPHEN 5-325 MG PO TABS: 1.0000 | ORAL_TABLET | ORAL | Status: AC | PRN

## 2014-04-24 MED ORDER — OXYCODONE HCL 30 MG PO TABS: 30.0000 mg | ORAL_TABLET | ORAL | Status: AC | PRN

## 2014-04-24 MED ORDER — FENTANYL 75 MCG/HR TD PT72: 75.0000 ug | MEDICATED_PATCH | TRANSDERMAL | Status: AC

## 2014-04-24 NOTE — Progress Notes (Signed)
Report called to [facility] 

## 2014-04-24 NOTE — Clinical Social Work Note (Signed)
[facility] to [facility] today [facility] [place] [place] RN to call report to: [phone] Transportation: [facility]  Patient and patient's sister, Garlan FairMinnie, have both been updated and are agreeable to this plan of discharge.  RN aware.  DC summary has been sent to SNF for review.  Completed discharge packet on chart.  Vickii PennaGina Adilee Lemme, LCSWA [phone]  Psychiatric & Orthopedics (5N 1-16) Clinical Social Worker

## 2014-04-24 NOTE — Clinical Social Work Note (Signed)
Per MD documentation, pt is ready to discharge to SNF once SNF bed is ready. Pt [facility].  [facility] insurance authorization and admission is confirmed for today.  CSW notified MD and faxed FL2 for signature.  CSW awaiting signed FL2, dc summary/order.  Vickii PennaGina Domenique Quest, LCSWA [phone]  Psychiatric & Orthopedics (5N 1-16) Clinical Social Worker

## 2014-04-24 NOTE — Clinical Social Work Placement (Signed)
Clinical Social Work Department CLINICAL SOCIAL WORK PLACEMENT NOTE 04/24/2014  Patient:  [name],[name]  Account Number:  [id number] Admit date:  04/21/2014  Clinical Social Worker:  Read DriversEGINA Katelyne Galster, LCSWA  Date/time:  04/24/2014 01:30 PM  Clinical Social Work is seeking post-discharge placement for this patient at the following level of care:   SKILLED NURSING   (*CSW will update this form in Epic as items are completed)   04/24/2014  Patient/family provided with [facility] Department of Clinical Social Work's list of facilities offering this level of care within the geographic area requested by the patient (or if unable, by the patient's family).  04/24/2014  Patient/family informed of their freedom to choose among providers that offer the needed level of care, that participate in Medicare, Medicaid or managed care program needed by the patient, have an available bed and are willing to accept the patient.  04/24/2014  Patient/family informed of [facility]' ownership interest in [facility], as well as of the fact that they are under no obligation to receive care at this facility.  PASARR submitted to EDS on  PASARR number received on   FL2 transmitted to all facilities in geographic area requested by pt/family on  04/24/2014 FL2 transmitted to all facilities within larger geographic area on   Patient informed that his/her managed care company has contracts with or will negotiate with  certain facilities, including the following:     Patient/family informed of bed offers received:  04/24/2014 Patient [facility] bed at [facility] Physician recommends and patient chooses bed at    [facility] on  04/24/2014 Patient to be transferred to facility by [facility] Patient and family notified of transfer on 04/24/2014 Name of family member notified:  Garlan FairMinnie, Sister  The following physician request were entered in Epic:   Additional  Comments:   Vickii PennaGina Anneth Brunell, LCSWA [phone]  Psychiatric & Orthopedics (5N 1-16) Clinical Social Worker

## 2014-04-24 NOTE — Progress Notes (Signed)
Patient ID: Kaitlin RainsCheryl C Parran, female   DOB: Sep 16, 1955, 58 y.o.   MRN: [id number] H/H has stayed stable the last 3 days with normal vital signs as well.  Her left BKA stump dressing remains clean and dry.  Medically stable.  Can be discharged to SNF when bed available again.  Will consult social work.

## 2014-04-24 NOTE — Clinical Social Work Psychosocial (Signed)
Clinical Social Work Department BRIEF PSYCHOSOCIAL ASSESSMENT 04/24/2014  Patient:  [name],[name]     Account Number:  [id number]     Admit date:  04/21/2014  Clinical Social Worker:  Read DriversINGLE,Marcena Dias, LCSWA  Date/Time:  04/24/2014 01:25 PM  Referred by:  Physician  Date Referred:  04/24/2014 Referred for  SNF Placement  Psychosocial assessment   Other Referral:   none   Interview type:  Patient Other interview type:   sister in room during assessment per pt request    PSYCHOSOCIAL DATA Living Status:  ALONE Admitted from facility:   Level of care:   Primary support name:  [name] Primary support relationship to patient:  SIBLING Degree of support available:   adequate    CURRENT CONCERNS Current Concerns  Post-Acute Placement   Other Concerns:   none    SOCIAL WORK ASSESSMENT / PLAN CSW assessed pt at bedside.  Pt sister, [name], was also present during the course of the assessment per pt request. Pt states she was recently admitted for BKA and [facility] where she was awaiting insurance authorization for admission to [facility] [place] [facility].  Pt states she did not have a good experience at this facility.  CSW contacted [facility].  They have received insurance authorization and is willing to admit pt today. Pt has been evaluated by MD and MD has placed dc orders and written summary.  Summary sent to [facility] for their review.  Pt is agreeable to dc plans and sister is aware and agreeable as well.  RN updated.  Pt requests to be transported via [facility].  CSW to arrange.   Assessment/plan status:  Psychosocial Support/Ongoing Assessment of Needs Other assessment/ plan:   FL2  PASARR-existing  insurance authorization received by [facility] [place] Per Marylene LandAngela   Information/referral to community resources:   SNF    PATIENT'S/FAMILY'S RESPONSE TO PLAN OF CARE: Pt and sister, [name], are both agreeable to [facility] plans: [facility] [place].   Transportation needs: [facility].       Vickii PennaGina Ranay Ketter, LCSWA [phone]  Psychiatric & Orthopedics (5N 1-16) Clinical Social Worker

## 2014-04-24 NOTE — Discharge Summary (Signed)
Patient ID: [name] MRN: [id number] DOB/AGE: 1955/12/12 58 y.o.  Admit date: 04/21/2014 Discharge date: 04/24/2014  Admission Diagnoses:  Active Problems:   Anemia due to blood loss, acute   Discharge Diagnoses:  Same  Past Medical History  Diagnosis Date  . Diabetes mellitus   . Peripheral vascular disease   . Hyperlipidemia   . Obesity   . Hypertension   . Arthritis   . Ulcer of ankle     right  . Coronary artery disease   . COPD (chronic obstructive pulmonary disease)   . NSTEMI (non-ST elevated myocardial infarction)   . Depression   . Anemia 04/21/2014    DUE TO BLLOD LOSS     Surgeries:  on * No surgery found *   Consultants:    Discharged Condition: Improved  Hospital Course: [name] is an 58 y.o. female who was admitted 04/21/2014 for operative treatment of<principal problem not specified>. Patient has severe unremitting pain that affects sleep, daily activities, and work/hobbies. After pre-op clearance the patient was taken to the operating room on * No surgery found * and underwent  .    Patient was given perioperative antibiotics: Anti-infectives    None       Patient was given sequential compression devices, early ambulation, and chemoprophylaxis to prevent DVT.  She received a blood transfusion and she responded to this quite well.  Her H/H remained stable as well as her vital signs remained stable during her re-admission.  Patient benefited maximally from hospital stay and there were no complications.    Recent vital signs: Patient Vitals for the past 24 hrs:  BP Temp Temp src Pulse Resp SpO2  04/24/14 1004 (!) 100/43 mmHg 98.4 F (36.9 C) Oral 76 19 100 %  04/24/14 0555 (!) 100/58 mmHg 98.1 F (36.7 C) Oral 72 18 94 %  04/23/14 1939 (!) 109/41 mmHg 98.8 F (37.1 C) Oral 75 18 100 %  04/23/14 1509 (!) 111/98 mmHg 98.1 F (36.7 C) Oral 70 18 100 %     Recent laboratory studies:  Recent Labs  04/21/14 1206  04/23/14 0620  04/24/14 0535  WBC 10.7*  < > 6.9 5.3  HGB 5.4*  < > 8.1* 8.1*  HCT 17.5*  < > 24.3* 24.8*  PLT 399  < > 223 244  NA 133*  --   --   --   K 5.2  --   --   --   CL 96  --   --   --   CO2 20  --   --   --   BUN 17  --   --   --   CREATININE 0.53  --   --   --   GLUCOSE 248*  --   --   --   INR 1.19  --   --   --   CALCIUM 8.7  --   --   --   < > = values in this interval not displayed.   Discharge Medications:     Medication List    STOP taking these medications        diclofenac sodium 1 % Gel  Commonly known as:  VOLTAREN      TAKE these medications        acetaminophen 500 MG tablet  Commonly known as:  TYLENOL  Take 1,000 mg by mouth every 6 (six) hours as needed for moderate pain.     alprazolam 2 MG  tablet  Commonly known as:  XANAX  Take 2 mg by mouth every 6 (six) hours as needed for sleep or anxiety.     aspirin 81 MG chewable tablet  Chew 1 tablet (81 mg total) by mouth daily.     atorvastatin 80 MG tablet  Commonly known as:  LIPITOR  Take 1 tablet (80 mg total) by mouth daily at 6 PM.     bisoprolol 5 MG tablet  Commonly known as:  ZEBETA  Take 1 tablet (5 mg total) by mouth daily.     clopidogrel 75 MG tablet  Commonly known as:  PLAVIX  Take 1 tablet (75 mg total) by mouth daily.     docusate sodium 100 MG capsule  Commonly known as:  COLACE  Take 200 mg by mouth 2 (two) times daily.     escitalopram 10 MG tablet  Commonly known as:  LEXAPRO  Take 1 tablet (10 mg total) by mouth daily.     fentaNYL 75 MCG/HR  Commonly known as:  DURAGESIC - dosed mcg/hr  Place 1 patch (75 mcg total) onto the skin every 3 (three) days.     fluticasone 50 MCG/ACT nasal spray  Commonly known as:  FLONASE  Place 2 sprays into both nostrils daily as needed for allergies.     furosemide 40 MG tablet  Commonly known as:  LASIX  Take 1 tablet (40 mg total) by mouth daily.     gabapentin 400 MG capsule  Commonly known as:  NEURONTIN  Take 1 capsule (400 mg  total) by mouth 3 (three) times daily.     glimepiride 2 MG tablet  Commonly known as:  AMARYL  Take 1 tablet (2 mg total) by mouth 2 (two) times daily.     insulin aspart 100 UNIT/ML injection  Commonly known as:  novoLOG  Inject 0-15 Units into the skin 4 (four) times daily -  with meals and at bedtime. CBG 121-150 2 units, 151-200 3 units, 201-250 units, 251-300 8 units, 301-350 11 units, 351-400 15 units, >400 call MD     losartan 25 MG tablet  Commonly known as:  COZAAR  Take 1 tablet (25 mg total) by mouth at bedtime.     metFORMIN 1000 MG tablet  Commonly known as:  GLUCOPHAGE  Take 1 tablet (1,000 mg total) by mouth 2 (two) times daily with a meal.     methocarbamol 500 MG tablet  Commonly known as:  ROBAXIN  Take 1 tablet (500 mg total) by mouth every 6 (six) hours as needed for muscle spasms.     nitroGLYCERIN 0.4 mg/hr patch  Commonly known as:  NITRODUR - Dosed in mg/24 hr  Place 1 patch (0.4 mg total) onto the skin daily.     oxycodone 30 MG immediate release tablet  Commonly known as:  ROXICODONE  Take 1 tablet (30 mg total) by mouth every 4 (four) hours as needed for pain.     oxyCODONE-acetaminophen 5-325 MG per tablet  Commonly known as:  ROXICET  Take 1 tablet by mouth every 4 (four) hours as needed for severe pain.     protein supplement Powd  Take 6 g by mouth 3 (three) times daily with meals.     spironolactone 25 MG tablet  Commonly known as:  ALDACTONE  Take 0.5 tablets (12.5 mg total) by mouth daily.        Diagnostic Studies: Dg Ankle 2 Views Left  04/03/2014   CLINICAL DATA:  Ankle pain.  EXAM: LEFT ANKLE - 2 VIEW  COMPARISON:  03/05/2014.  FINDINGS: Patient has had prior open reduction and internal fixation of the distal tibia and fibula. Good anatomic alignment. Degenerative change. No acute abnormality. Vascular calcification.  IMPRESSION: 1. Open reduction internal fixation distal tibia and fibula. Good anatomic alignment. 2. Severe  degenerative change left ankle. 3. No acute abnormality.   Electronically Signed   By: Maisie Fushomas  Register   On: 04/03/2014 14:07   Ct Ankle Left Wo Contrast  04/03/2014   CLINICAL DATA:  Purulent drainage from the anterior ankle. Initial encounter.  EXAM: CT OF THE LEFT ANKLE WITHOUT CONTRAST  TECHNIQUE: Multidetector CT imaging of the left ankle was performed according to the standard protocol. Multiplanar CT image reconstructions were also generated.  COMPARISON:  Radiography from earlier the same day.  FINDINGS: Patient is status post ankle fracture with 2 screws traversing the tibial epiphysis. Status post fixation of the distal fibula with compression plate and interfragmentary screw. The fibular construct is unremarkable, but the lower screw in the distal tibia extends into the posterior ankle joint. The ankle is markedly degenerated as evidenced by narrowing and extensive subchondral cystic formation. Patchy sclerosis in the distal tibia may reflect chronic bone infarction. There is no definite acute osteomyelitis as there is no convincing cortical erosion or periosteal reaction. There is no lucency around the screws. No chronic periosteal reaction or involucrum suggestive of chronic osteomyelitis. Subcutaneous fat infiltration is present along the medial and posterior ankle, correlating with the reported draining wound. There is gas in the posterior ankle joint which may reflect communication. No gross discontinuity or marked thickening of the medial tendons to suggest associated tenosynovitis.  There is mild degenerative changes to the subtalar joint, with spurring around the posterior facet. Mild talonavicular degeneration with marginal spurring.  Diffuse subcutaneous reticulation and dystrophic calcifications.  IMPRESSION: 1. Small volume gas in the posterior ankle joint could reflect septic arthritis in this patient with draining wound along the medial ankle. No visible osteomyelitis or hardware  infection. 2. Remote left ankle fracture status post ORIF. A tibial screw penetrates the posterior ankle joint. 3. 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   Electronically Signed   By: Tiburcio PeaJonathan  Roy M.D.   On: 04/03/2014 23:19   Dg Chest Port 1 View  04/21/2014   CLINICAL DATA:  Initial encounter for hypotension  EXAM: PORTABLE CHEST - 1 VIEW  COMPARISON:  03/17/2014.  FINDINGS: 1301 hrs. Subsegmental atelectasis seen at the left lung base. Lung volumes are low without edema or focal airspace consolidation. No pleural effusion. The bibasilar airspace disease and effusion seen previously has resolved in the interval. Right IJ central line is been removed since the prior study. Cardiopericardial silhouette is at upper limits of normal for size. Imaged bony structures of the thorax are intact. Posttraumatic deformity is noted in the left humeral head. Telemetry leads overlie the chest.  IMPRESSION: Low volumes with left base atelectasis.   Electronically Signed   By: Kennith CenterEric  Mansell M.D.   On: 04/21/2014 13:18    Disposition: 03-Skilled Nursing Facility      Discharge Instructions    Call MD / Call 911    Complete by:  As directed   If you experience chest pain or shortness of breath, CALL 911 and be transported to the hospital emergency room.  If you develope a fever above 101 F, pus (white drainage) or increased drainage or redness at the wound, or calf pain,  call your surgeon's office.     Constipation Prevention    Complete by:  As directed   Drink plenty of fluids.  Prune juice may be helpful.  You may use a stool softener, such as Colace (over the counter) 100 mg twice a day.  Use MiraLax (over the counter) for constipation as needed.     Diet - low sodium heart healthy    Complete by:  As directed      Discharge instructions    Complete by:  As directed   Leave current dressing in place until her outpatient orthopedic follow-up     Discharge patient    Complete by:  As  directed      Increase activity slowly as tolerated    Complete by:  As directed            Follow-up Information    Follow up with DUDA,MARCUS V, MD. Schedule an appointment as soon as possible for a visit in 1 week.   Specialty:  Orthopedic Surgery   Contact information:   [address] [place] [address] [phone]        Signed: Kathryne Hitch 04/24/2014, 12:02 PM

## 2014-04-25 DIAGNOSIS — E119 Type 2 diabetes mellitus without complications: Secondary | ICD-10-CM | POA: Diagnosis not present

## 2014-04-25 DIAGNOSIS — I509 Heart failure, unspecified: Secondary | ICD-10-CM | POA: Diagnosis not present

## 2014-04-25 DIAGNOSIS — I222 Subsequent non-ST elevation (NSTEMI) myocardial infarction: Secondary | ICD-10-CM | POA: Diagnosis not present

## 2014-04-25 DIAGNOSIS — I10 Essential (primary) hypertension: Secondary | ICD-10-CM

## 2014-04-25 DIAGNOSIS — J449 Chronic obstructive pulmonary disease, unspecified: Secondary | ICD-10-CM | POA: Diagnosis not present

## 2014-04-25 DIAGNOSIS — F329 Major depressive disorder, single episode, unspecified: Secondary | ICD-10-CM

## 2014-04-25 DIAGNOSIS — Z7901 Long term (current) use of anticoagulants: Secondary | ICD-10-CM

## 2014-04-27 LAB — CULTURE, BLOOD (ROUTINE X 2)
CULTURE: NO GROWTH
Culture: NO GROWTH

## 2014-04-27 LAB — AFB CULTURE WITH SMEAR (NOT AT ARMC): Acid Fast Smear: NONE SEEN

## 2014-05-01 ENCOUNTER — Ambulatory Visit (INDEPENDENT_AMBULATORY_CARE_PROVIDER_SITE_OTHER): Payer: BC Managed Care – PPO | Admitting: Physician Assistant

## 2014-05-01 ENCOUNTER — Encounter: Payer: Self-pay | Admitting: Physician Assistant

## 2014-05-01 VITALS — BP 130/54 | HR 74 | Ht 62.0 in | Wt 179.0 lb

## 2014-05-01 DIAGNOSIS — I214 Non-ST elevation (NSTEMI) myocardial infarction: Secondary | ICD-10-CM

## 2014-05-01 DIAGNOSIS — N289 Disorder of kidney and ureter, unspecified: Secondary | ICD-10-CM

## 2014-05-01 DIAGNOSIS — D649 Anemia, unspecified: Secondary | ICD-10-CM

## 2014-05-01 DIAGNOSIS — Z720 Tobacco use: Secondary | ICD-10-CM | POA: Insufficient documentation

## 2014-05-01 DIAGNOSIS — I255 Ischemic cardiomyopathy: Secondary | ICD-10-CM

## 2014-05-01 LAB — BASIC METABOLIC PANEL
BUN: 21 mg/dL (ref 6–23)
CHLORIDE: 97 meq/L (ref 96–112)
CO2: 27 meq/L (ref 19–32)
Calcium: 8.8 mg/dL (ref 8.4–10.5)
Creatinine, Ser: 0.6 mg/dL (ref 0.4–1.2)
GFR: 105.43 mL/min (ref >60.00)
Glucose, Bld: 159 mg/dL — ABNORMAL HIGH (ref 70–99)
POTASSIUM: 4.3 meq/L (ref 3.5–5.1)
Sodium: 133 mEq/L — ABNORMAL LOW (ref 135–145)

## 2014-05-01 LAB — CBC
Hemoglobin: 8.1 g/dL — ABNORMAL LOW (ref 12.0–15.0)
MCHC: 31.8 g/dL (ref 30.0–36.0)
MCV: 80.7 fl (ref 78.0–100.0)
Platelets: 516 10*3/uL — ABNORMAL HIGH (ref 150.0–400.0)
RBC: 3.14 Mil/uL — AB (ref 3.87–5.11)
RDW: 18 % — ABNORMAL HIGH (ref 11.5–15.5)
WBC: 7.3 10*3/uL (ref 4.0–10.5)

## 2014-05-01 NOTE — Assessment & Plan Note (Signed)
Smoking cessation discussed at length with patient

## 2014-05-01 NOTE — Assessment & Plan Note (Signed)
Patient suffered a recent NSTEMI.she's not a candidate for CABG. She underwent successful complex PCI of the mid circumflex and proximal mid and distal LAD with drug-eluting stents. She will need lifelong dual antiplatelet therapy with aspirin and clopidogrel. P2Y12 testing was  found to be responsive.check BME T  And CBC.will have patient follow up with Dr. Caryl AspSkainsin 2 months.

## 2014-05-01 NOTE — Patient Instructions (Signed)
Your physician recommends that you continue on your current medications as directed. Please refer to the Current Medication list given to you today.    Your physician recommends that you return for lab work  TODAY  BMET AND CBC   Your physician recommends that you schedule a follow-up appointment in: 2 MONTHS WITH DR Anne FuSKAINS    Smoking Cessation, Tips for Success If you are ready to quit smoking, congratulations! You have chosen to help yourself be healthier. Cigarettes bring nicotine, tar, carbon monoxide, and other irritants into your body. Your lungs, heart, and blood vessels will be able to work better without these poisons. There are many different ways to quit smoking. Nicotine gum, nicotine patches, a nicotine inhaler, or nicotine nasal spray can help with physical craving. Hypnosis, support groups, and medicines help break the habit of smoking. WHAT THINGS CAN I DO TO MAKE QUITTING EASIER?  Here are some tips to help you quit for good:  Pick a date when you will quit smoking completely. Tell all of your friends and family about your plan to quit on that date.  Do not try to slowly cut down on the number of cigarettes you are smoking. Pick a quit date and quit smoking completely starting on that day.  Throw away all cigarettes.   Clean and remove all ashtrays from your home, work, and car.  On a card, write down your reasons for quitting. Carry the card with you and read it when you get the urge to smoke.  Cleanse your body of nicotine. Drink enough water and fluids to keep your urine clear or pale yellow. Do this after quitting to flush the nicotine from your body.  Learn to predict your moods. Do not let a bad situation be your excuse to have a cigarette. Some situations in your life might tempt you into wanting a cigarette.  Never have "just one" cigarette. It leads to wanting another and another. Remind yourself of your decision to quit.  Change habits associated with  smoking. If you smoked while driving or when feeling stressed, try other activities to replace smoking. Stand up when drinking your coffee. Brush your teeth after eating. Sit in a different chair when you read the paper. Avoid alcohol while trying to quit, and try to drink fewer caffeinated beverages. Alcohol and caffeine may urge you to smoke.  Avoid foods and drinks that can trigger a desire to smoke, such as sugary or spicy foods and alcohol.  Ask people who smoke not to smoke around you.  Have something planned to do right after eating or having a cup of coffee. For example, plan to take a walk or exercise.  Try a relaxation exercise to calm you down and decrease your stress. Remember, you may be tense and nervous for the first 2 weeks after you quit, but this will pass.  Find new activities to keep your hands busy. Play with a pen, coin, or rubber band. Doodle or draw things on paper.  Brush your teeth right after eating. This will help cut down on the craving for the taste of tobacco after meals. You can also try mouthwash.   Use oral substitutes in place of cigarettes. Try using lemon drops, carrots, cinnamon sticks, or chewing gum. Keep them handy so they are available when you have the urge to smoke.  When you have the urge to smoke, try deep breathing.  Designate your home as a nonsmoking area.  If you are a heavy smoker,  ask your health care provider about a prescription for nicotine chewing gum. It can ease your withdrawal from nicotine.  Reward yourself. Set aside the cigarette money you save and buy yourself something nice.  Look for support from others. Join a support group or smoking cessation program. Ask someone at home or at work to help you with your plan to quit smoking.  Always ask yourself, "Do I need this cigarette or is this just a reflex?" Tell yourself, "Today, I choose not to smoke," or "I do not want to smoke." You are reminding yourself of your decision to  quit.  Do not replace cigarette smoking with electronic cigarettes (commonly called e-cigarettes). The safety of e-cigarettes is unknown, and some may contain harmful chemicals.  If you relapse, do not give up! Plan ahead and think about what you will do the next time you get the urge to smoke. HOW WILL I FEEL WHEN I QUIT SMOKING? You may have symptoms of withdrawal because your body is used to nicotine (the addictive substance in cigarettes). You may crave cigarettes, be irritable, feel very hungry, cough often, get headaches, or have difficulty concentrating. The withdrawal symptoms are only temporary. They are strongest when you first quit but will go away within 10-14 days. When withdrawal symptoms occur, stay in control. Think about your reasons for quitting. Remind yourself that these are signs that your body is healing and getting used to being without cigarettes. Remember that withdrawal symptoms are easier to treat than the major diseases that smoking can cause.  Even after the withdrawal is over, expect periodic urges to smoke. However, these cravings are generally short lived and will go away whether you smoke or not. Do not smoke! WHAT RESOURCES ARE AVAILABLE TO HELP ME QUIT SMOKING? Your health care provider can direct you to community resources or hospitals for support, which may include:  Group support.  Education.  Hypnosis.  Therapy. Document Released: 03/07/2004 Document Revised: 10/24/2013 Document Reviewed: 11/25/2012 [facility] Patient Information 2015 [employer] [place]. This information is not intended to replace advice given to you by your health care provider. Make sure you discuss any questions you have with your health care provider.

## 2014-05-01 NOTE — Assessment & Plan Note (Addendum)
Patient has severe ischemic cardiomyopathy. EF 15% cath. She is currently compensated and on good meds. Probably reassess with 2-D echo in 3 months. Followup with Dr. Chales AbrahamsSkeins.

## 2014-05-01 NOTE — Progress Notes (Signed)
Cardiologist: prior patient of Dr. Juanda Chance, will see Dr. Anne Fu now.   HPI: This is a 77 year oldfemale patient with very complicated past medical history. She had a NSTEMI with severe three-vessel CAD who is not a candidate for CABG because of severe LV dysfunction and multiple cold morbidities. She underwent PCI/DES of the LAD x3 and circumflex x1 on 03/22/14. She had acute on chronic systolic congestive heart failure EF 10-15%. She also had respiratory failure requiring intubation, fever or leukocytosis with an SSA bacteremia 03/07/14, peripheral vascular disease status post right iliac stent, status post femoral tibial peroneal trunk.she was readmitted with a necrotic wound of her left ankle status post splint valve age with debridement of a deep abscess from deep retained hardware with progressive necrotic skin exposed bone and tendons. 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 She had another readmission for anemia requiring transfusions.  Patient is currently living in a nursing home and expects to be there another month and a half doing rehabilitation. Unfortunately her son brings her for cigarettes a day and she continues to smoke. She denies any chest pain, palpitations, dyspnea, dyspnea on exertion, dizziness or presyncope.  Allergies  Allergen Reactions  . Adhesive [Tape] Rash  . Penicillins Hives and Other (See Comments)    Tolerates cefepime     Current Outpatient Prescriptions  Medication Sig Dispense Refill  . acetaminophen (TYLENOL) 500 MG tablet Take 1,000 mg by mouth every 6 (six) hours as needed for moderate pain.    Marland Kitchen alprazolam (XANAX) 2 MG tablet Take 2 mg by mouth every 6 (six) hours as needed for sleep or anxiety.     Marland Kitchen aspirin 81 MG chewable tablet Chew 1 tablet (81 mg total) by mouth daily. 30 tablet 0  . atorvastatin (LIPITOR) 80 MG tablet Take 1 tablet (80 mg total) by mouth daily at 6 PM. 30 tablet 0  . bisoprolol (ZEBETA) 5 MG tablet Take 1  tablet (5 mg total) by mouth daily. 30 tablet 0  . clopidogrel (PLAVIX) 75 MG tablet Take 1 tablet (75 mg total) by mouth daily. 30 tablet 0  . docusate sodium (COLACE) 100 MG capsule Take 200 mg by mouth 2 (two) times daily.    Marland Kitchen escitalopram (LEXAPRO) 10 MG tablet Take 1 tablet (10 mg total) by mouth daily. 30 tablet 0  . fentaNYL (DURAGESIC - DOSED MCG/HR) 75 MCG/HR Place 1 patch (75 mcg total) onto the skin every 3 (three) days. 5 patch 0  . fluticasone (FLONASE) 50 MCG/ACT nasal spray Place 2 sprays into both nostrils daily as needed for allergies.     . furosemide (LASIX) 40 MG tablet Take 1 tablet (40 mg total) by mouth daily. 30 tablet 0  . gabapentin (NEURONTIN) 400 MG capsule Take 1 capsule (400 mg total) by mouth 3 (three) times daily. 90 capsule 0  . glimepiride (AMARYL) 2 MG tablet Take 1 tablet (2 mg total) by mouth 2 (two) times daily. 30 tablet 0  . insulin aspart (NOVOLOG) 100 UNIT/ML injection Inject 0-15 Units into the skin 4 (four) times daily -  with meals and at bedtime. CBG 121-150 2 units, 151-200 3 units, 201-250 units, 251-300 8 units, 301-350 11 units, 351-400 15 units, >400 call MD    . losartan (COZAAR) 25 MG tablet Take 1 tablet (25 mg total) by mouth at bedtime. 30 tablet 0  . metFORMIN (GLUCOPHAGE) 1000 MG tablet Take 1 tablet (1,000 mg total) by mouth 2 (two) times daily with a meal. 60  tablet 0  . methocarbamol (ROBAXIN) 500 MG tablet Take 1 tablet (500 mg total) by mouth every 6 (six) hours as needed for muscle spasms. 60 tablet 0  . nitroGLYCERIN (NITRODUR - DOSED IN MG/24 HR) 0.4 mg/hr patch Place 1 patch (0.4 mg total) onto the skin daily. 30 patch 12  . oxycodone (ROXICODONE) 30 MG immediate release tablet Take 1 tablet (30 mg total) by mouth every 4 (four) hours as needed for pain. 30 tablet 0  . oxyCODONE-acetaminophen (ROXICET) 5-325 MG per tablet Take 1 tablet by mouth every 4 (four) hours as needed for severe pain. 60 tablet 0  . protein supplement  (RESOURCE BENEPROTEIN) POWD Take 6 g by mouth 3 (three) times daily with meals. 227 g 0  . spironolactone (ALDACTONE) 25 MG tablet Take 0.5 tablets (12.5 mg total) by mouth daily. 30 tablet 0   No current facility-administered medications for this visit.    Past Medical History  Diagnosis Date  . Diabetes mellitus   . Peripheral vascular disease   . Hyperlipidemia   . Obesity   . Hypertension   . Arthritis   . Ulcer of ankle     right  . Coronary artery disease   . COPD (chronic obstructive pulmonary disease)   . NSTEMI (non-ST elevated myocardial infarction)   . Depression   . Anemia 04/21/2014    DUE TO BLLOD LOSS     Past Surgical History  Procedure Laterality Date  . Cholecystectomy    . Femoral-tibial bypass graft Right 09/27/2013    Procedure: BYPASS GRAFT FEMORAL-TIBIAL ARTERY-RIGHT;  Surgeon: Nada LibmanVance W Brabham, MD;  [facility];  Service: Vascular;  Laterality: Right;  . Abdominal aorta stent Right   . Bypass graft popliteal to tibial Right   . Coronary angioplasty with stent placement      proximcal RCA stent ~ 2001  . Amputation Right 10/14/2013    Procedure: AMPUTATION RAY;  Surgeon: Nadara MustardMarcus V Duda, MD;  [facility];  Service: Orthopedics;  Laterality: Right;  Right 2nd Ray Amputation, [place] Graft Right Ankle  . Ankle surgery Left     pinnining for fracture  . I&d extremity Left 04/05/2014    Procedure: Irrigation and Debridement left Ankle, Remove Deep Hardware, and Place Antibiotic Beads;  Surgeon: Nadara MustardMarcus Duda V, MD;  Location: [facility] OR;  Service: Orthopedics;  Laterality: Left;  . Fracture surgery      Left ankle  . Below knee leg amputation Left 04/19/2014    DR DUDA  . Amputation Left 04/19/2014    Procedure: Left Below Knee Amputation;  Surgeon: Nadara MustardMarcus Duda V, MD;  [facility];  Service: Orthopedics;  Laterality: Left;    Family History  Problem Relation Age of Onset  . Cancer Mother   . Heart disease Father   . Hyperlipidemia Father   .  Hypertension Father   . Other Father     varicose veins  . Heart attack Father   . Heart disease Sister   . Hyperlipidemia Sister   . Hypertension Sister   . Other Sister     varicose veins  . Heart attack Sister   . Heart disease Brother   . Hypertension Brother   . Hyperlipidemia Brother   . Other Brother     varicose veins  . Heart attack Brother     History   Social History  . Marital Status: Widowed    Spouse Name: N/A    Number of Children: N/A  .  Years of Education: N/A   Occupational History  . Not on file.   Social History Main Topics  . Smoking status: Former Smoker -- 2.00 packs/day for 30 years    Types: Cigarettes    Quit date: 03/22/2014  . Smokeless tobacco: Never Used     Comment: pt states that she is not quitting smoking and refused smoking cessation materials  . Alcohol Use: No  . Drug Use: No  . Sexual Activity: Not on file   Other Topics Concern  . Not on file   Social History Narrative    ROS:see history of present illness otherwise negative   PHYSICAL EXAM: Well-nournished, in no acute distress,in a wheelchair. Neck: bilateral carotid bruits right greater than left,No JVD, HJR,  or thyroid enlargement  Lungs:decreased breath sounds throughout, No tachypnea, clear without wheezing, rales, or rhonchi  Cardiovascular: RRR,  Normal S1 and S2,positive S4, 2/6 systolic murmur at the left sternal border, no bruit, thrill, or heave.  Abdomen: BS normal. Soft without organomegaly, masses, lesions or tenderness.  Extremities:left below the knee amputation, right with brawny changes and decreased distal pulses. No edema.  SKin: Warm, no lesions or rashes   Musculoskeletal: No deformities  Neuro: no focal signs   Wt Readings from Last 3 Encounters:  04/23/14 177 lb 9.6 oz (80.559 kg)  04/19/14 179 lb (81.194 kg)  04/11/14 179 lb 7.3 oz (81.4 kg)    Lab Results  Component Value Date   WBC 5.3 04/24/2014   HGB 8.1* 04/24/2014   HCT  24.8* 04/24/2014   PLT 244 04/24/2014   GLUCOSE 248* 04/21/2014   CHOL 135 03/22/2014   TRIG 176* 03/22/2014   HDL 31* 03/22/2014   LDLCALC 69 03/22/2014   ALT 13 04/19/2014   AST 16 04/19/2014   NA 133* 04/21/2014   K 5.2 04/21/2014   CL 96 04/21/2014   CREATININE 0.53 04/21/2014   BUN 17 04/21/2014   CO2 20 04/21/2014   INR 1.19 04/21/2014   HGBA1C 7.2* 03/22/2014    AVW:UJWJXBEKG:normal sinus rhythm poor R wave progression consistent with old anterior infarct, inferior infarct, no acute change  Cardiac Catherization: IMPRESSIONS:    1. 40% distal left main coronary artery lesion. 2. Severe diffuse disease from the mid to distal left anterior descending artery.  Significant disease in the first diagonal. 3. 95% stenosis in the mid left circumflex artery, which is likely the culprit for her NSTEMI. 4. Focal, calcific 75% lesion in the mid  right coronary artery. 5. Severely decreased left ventricular systolic function.  LVEDP 31  mmHg.  Ejection fraction 15%.  RECOMMENDATION:    Difficult case involving a young patient with advanced atherosclerotic coronary disease.  CABG would be her best option for revascularization under normal circumstances.  Her LV dysfunction is more severe by ventriculogram.  Volume overload is likely playing a part as to why she cannot be extubated.  I discussed the case with Dr. Anne FuSkains.  Since there is no single lesion that can be revascularized, which will help her LV dysfunction, continue medical therapy with diuresis, and heart rate control.  Would consider CT surgery evaluation when she recovers from her current critical illness.       Conclusions: Successful complex PCI of the mid circumflex, and proximal/mid/distal LAD as detailed above  Recommendations: Lifelong dual antiplatelet therapy with aspirin and clopidogrel. Check P2Y12 testing in am and consider change to Brilinta if clopidogrel hyporesponsiveness demonstrated.  Tonny BollmanMichael Cooper MD,  [facility] 03/22/2014, 3:56 PM  TEE 03/08/14 Study Conclusions  - Left ventricle: There was mild concentric hypertrophy. Systolic   function was moderately reduced. The estimated ejection fraction   was in the range of 35% to 40%. Anterior, anteroseptal, apical   and inferoapical hypokinesis. - Aortic valve: No evidence of vegetation. - Mitral valve: There was mild regurgitation. - Left atrium: The atrium was dilated. No evidence of thrombus in   the atrial cavity or appendage. - Right atrium: No evidence of thrombus in the atrial cavity or   appendage. - Atrial septum: There was increased thickness of the septum,   consistent with lipomatous hypertrophy. No defect or patent   foramen ovale was identified. - Tricuspid valve: No evidence of vegetation. - Pulmonic valve: No evidence of vegetation.  Impressions:  - No evidence of endocarditis.

## 2014-05-03 ENCOUNTER — Telehealth: Payer: Self-pay | Admitting: Cardiology

## 2014-05-03 NOTE — Telephone Encounter (Signed)
Follow up:     Pt called for lab results please give her a call back. °

## 2014-05-03 NOTE — Telephone Encounter (Signed)
Returned call to pt.  Reviewed lab results and routed them to Dr Sherwood GamblerFusco.

## 2014-05-09 ENCOUNTER — Ambulatory Visit (INDEPENDENT_AMBULATORY_CARE_PROVIDER_SITE_OTHER): Payer: BC Managed Care – PPO | Admitting: Internal Medicine

## 2014-06-01 ENCOUNTER — Encounter (HOSPITAL_COMMUNITY): Payer: Self-pay | Admitting: Vascular Surgery

## 2014-06-23 DEATH — deceased

## 2014-07-13 ENCOUNTER — Ambulatory Visit: Payer: BC Managed Care – PPO | Admitting: Cardiology

## 2014-07-19 ENCOUNTER — Encounter: Payer: Self-pay | Admitting: Cardiology

## 2014-12-18 ENCOUNTER — Other Ambulatory Visit: Payer: Self-pay

## 2016-06-03 IMAGING — CR DG CHEST 1V PORT
1 series · 1 of 1 positions shown · non-contrast
Comparison: Chest radiograph 09/25/2013

CLINICAL DATA: Leg pain.  Smoker.

EXAM:
PORTABLE CHEST - 1 VIEW

[AP]
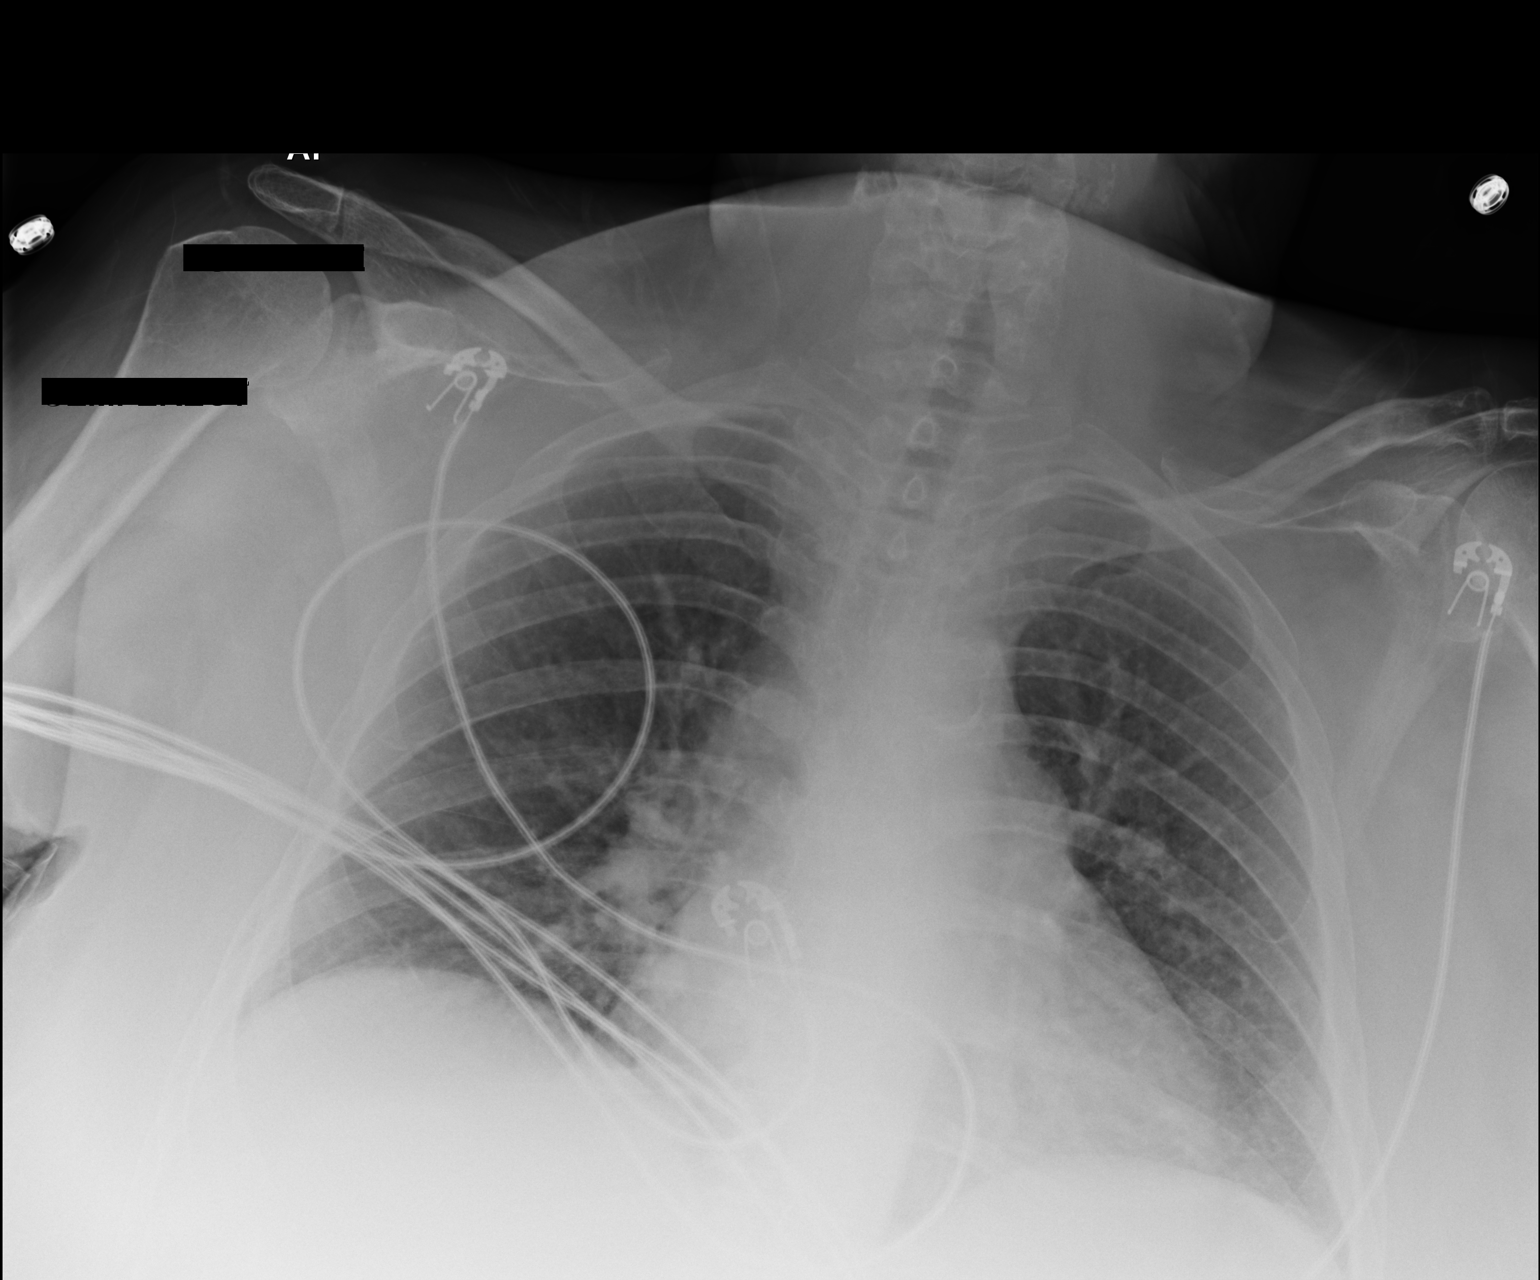

[1 of 1 positions shown; findings below may reference images not displayed]

FINDINGS: Cardiac leads project over the chest. Cardiomediastinal silhouette
is stable. There is atherosclerotic calcification in the thoracic
aortic arch. Lung volumes are low resulting in crowding of pulmonary
vascular markings. No airspace disease or pulmonary edema is
identified. No acute bony abnormality appreciated.
IMPRESSION: Low lung volumes.  No acute findings identified.

Atherosclerosis of the thoracic aorta.

## 2016-06-12 IMAGING — CR DG CHEST 1V PORT
2 series · 2 of 2 positions shown · non-contrast
Comparison: 02/22/2014.

CLINICAL DATA: Intubation.

EXAM:
PORTABLE CHEST - 1 VIEW

[AP (1 of 2)]
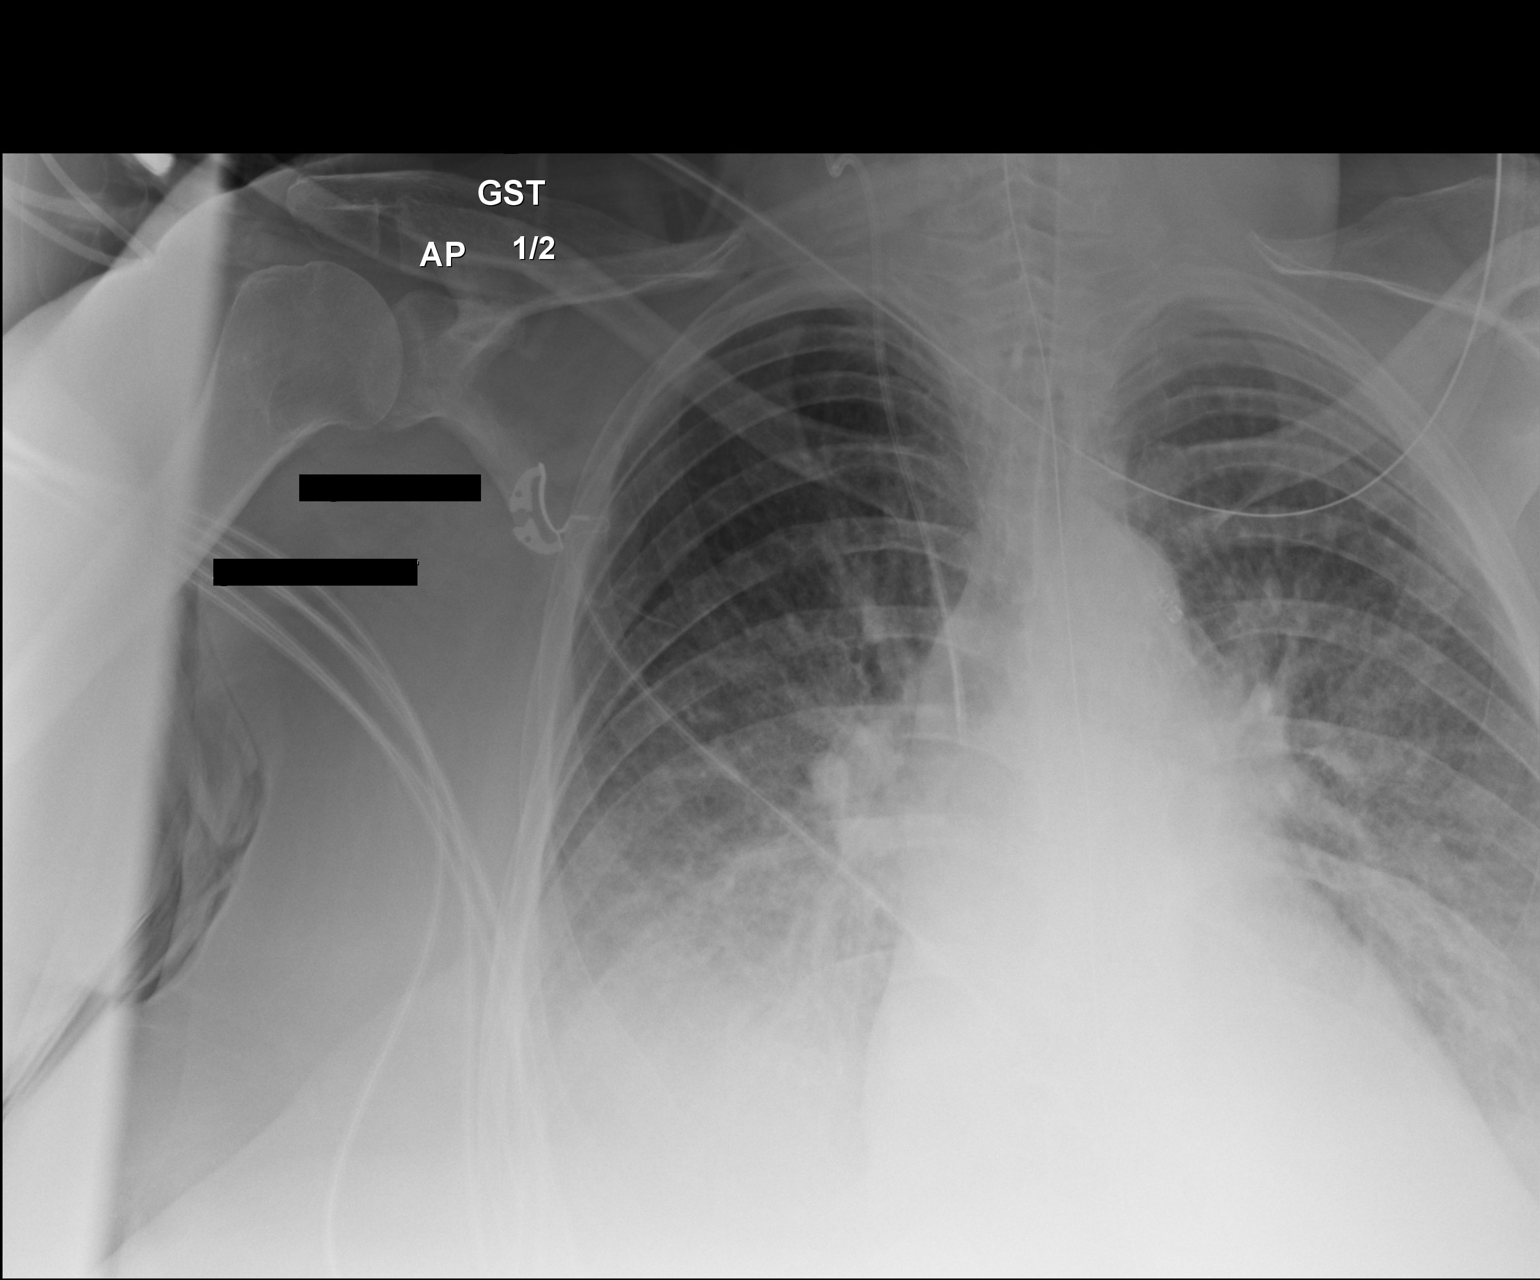

[AP (2 of 2)]
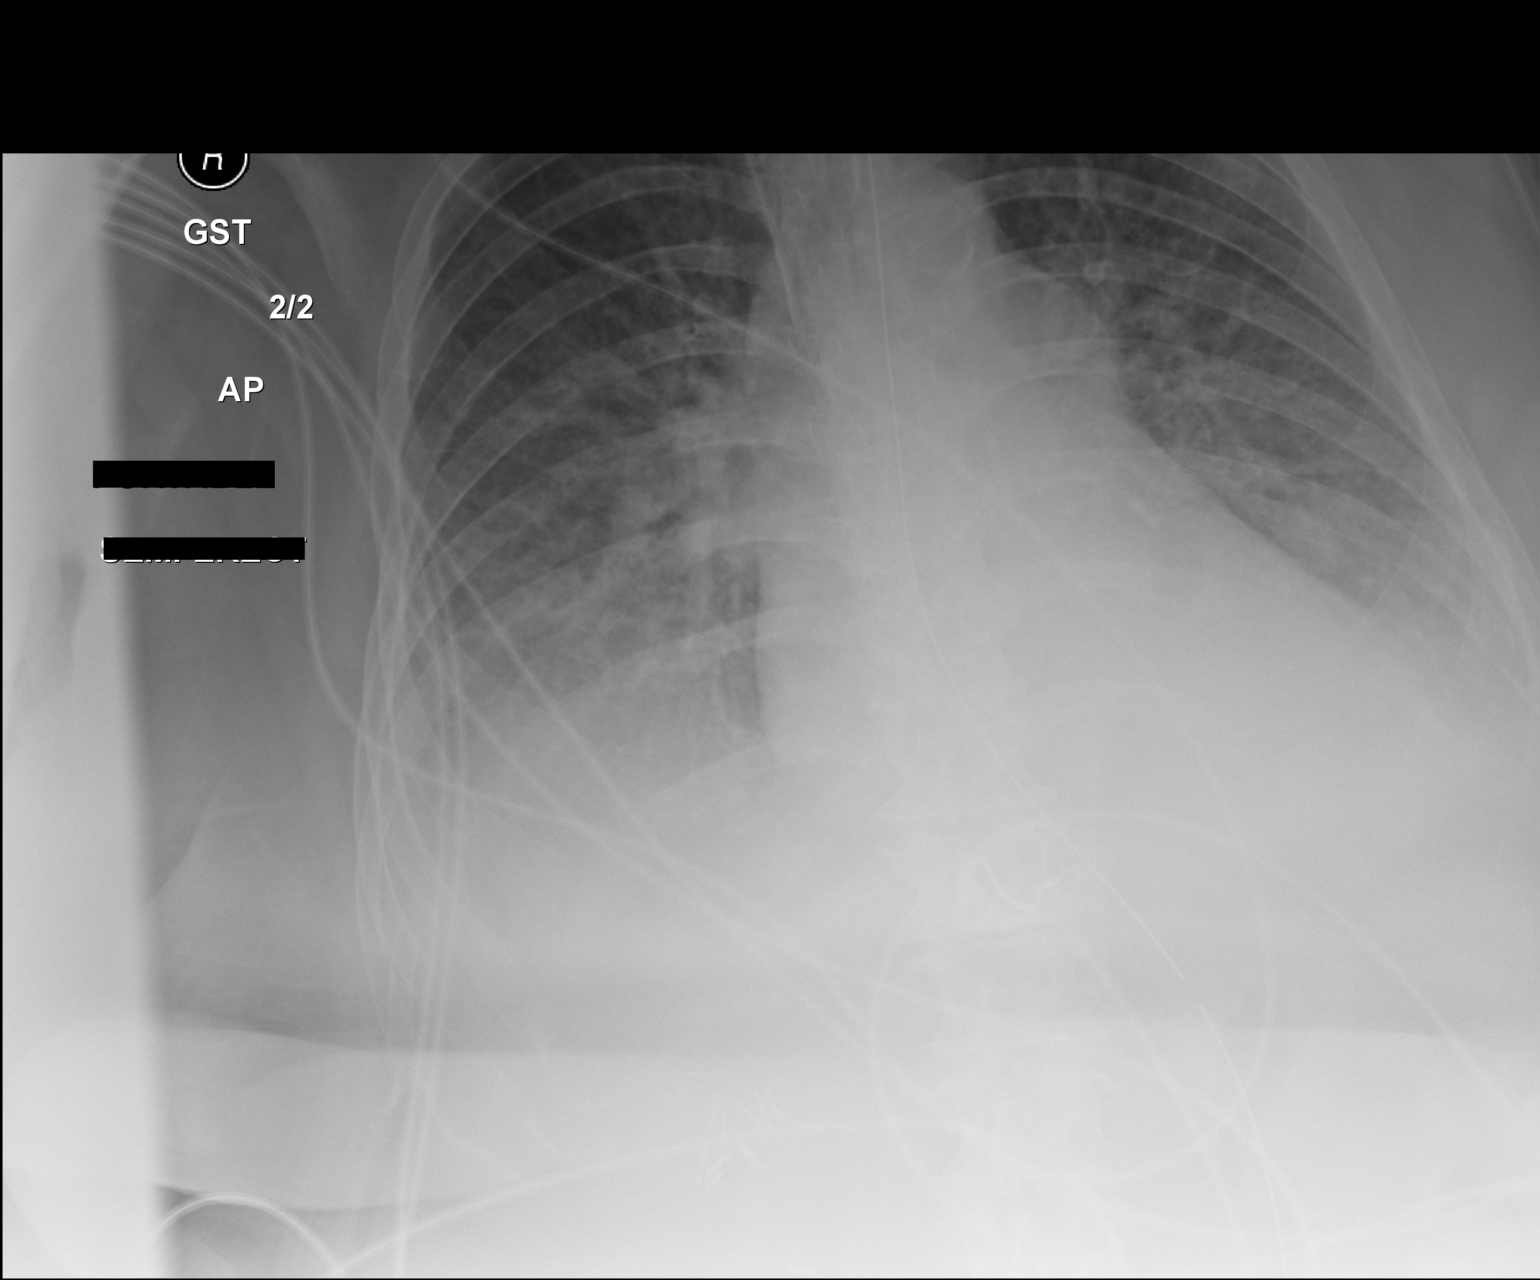

[2 of 2 positions shown; findings below may reference images not displayed]

FINDINGS: Endotracheal tube, NG tube, and right IJ line in stable position.
Cardiomegaly with pulmonary alveolar infiltrates bilaterally.
Bilateral pleural effusions. These findings are consistent with
congestive heart failure. Pulmonary edema has increased from prior
exam. No pneumothorax. No acute osseus abnormality. Carotid vascular
calcification.
IMPRESSION: 1. Line and tubes are in good anatomic position.
2. Progressive pulmonary edema with small bilateral pleural
effusions.
3. Carotid vascular disease.

## 2016-06-13 IMAGING — CR DG CHEST 1V PORT
1 series · 1 of 1 positions shown · non-contrast
Comparison: Study obtained earlier in the day

CLINICAL DATA: Status post thoracentesis

EXAM:
PORTABLE CHEST - 1 VIEW

[AP]
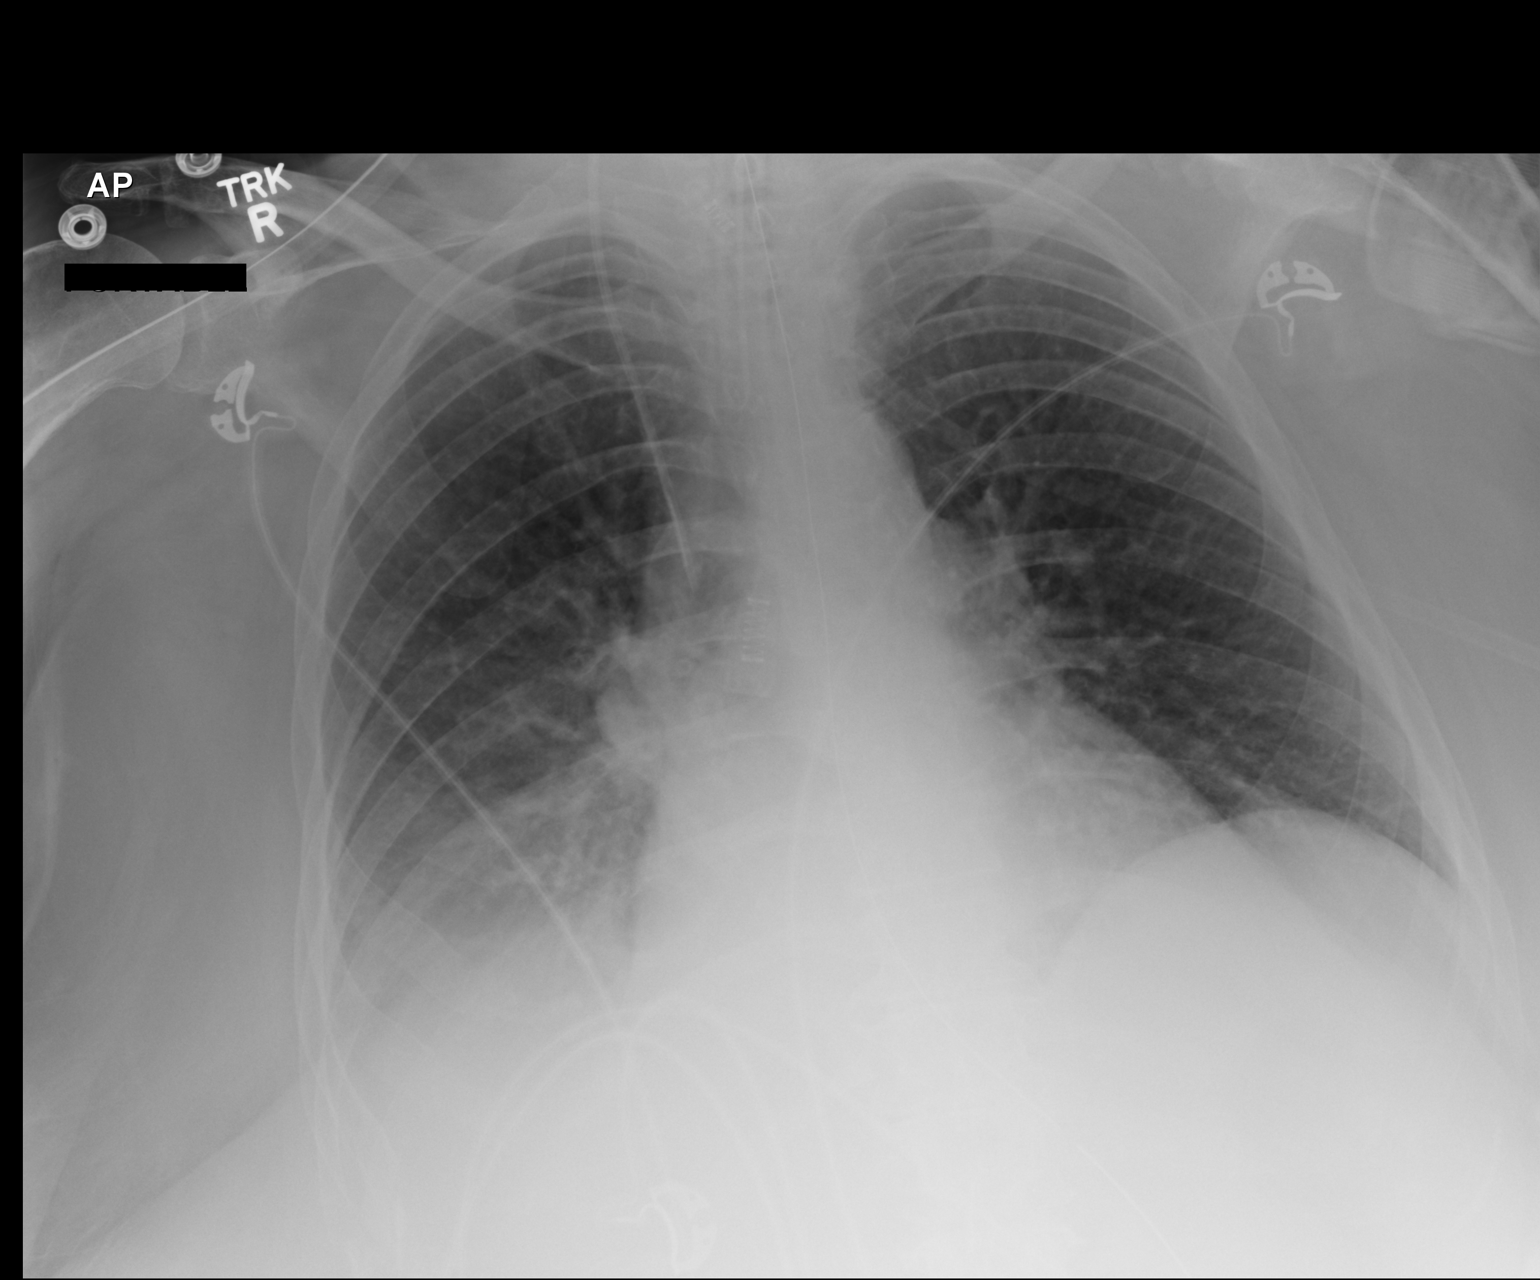

[1 of 1 positions shown; findings below may reference images not displayed]

FINDINGS: There is no appreciable pneumothorax. Left effusion is no longer
appreciable. There is a persistent right effusion, stable.

Endotracheal tube tip is 3.8 cm above the carina. Central catheter
tip is in the superior vena cava. Nasogastric tube tip and side port
are below the diaphragm. Heart is upper normal in size with
pulmonary vascularity within normal limits. No adenopathy.
IMPRESSION: Tube and catheter positions as described. Left effusion resolved.
Right effusion persists. No pneumothorax. No change in cardiac
silhouette.

## 2016-06-14 IMAGING — CR DG ABD PORTABLE 1V
1 series · 1 of 1 positions shown · non-contrast
Comparison: 03/08/2014

CLINICAL DATA: Feeding tube placement

EXAM:
PORTABLE ABDOMEN - 1 VIEW

[AP]
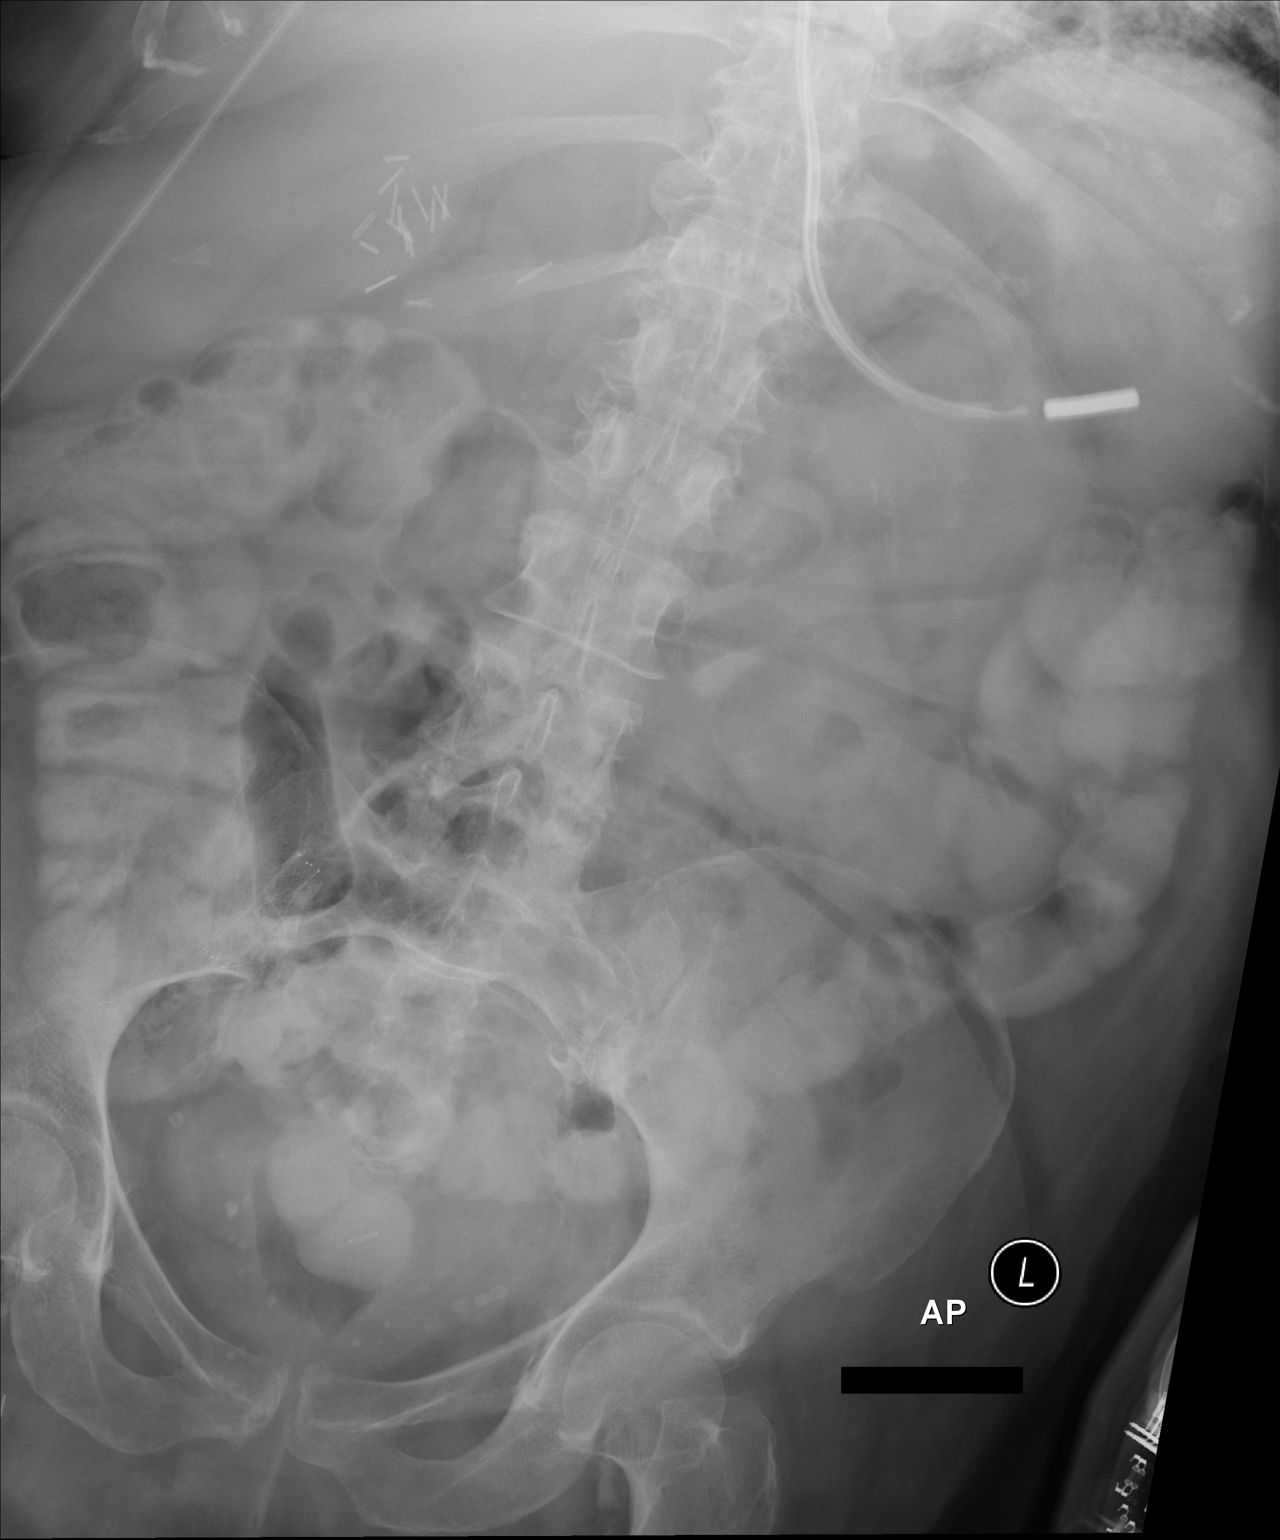

[1 of 1 positions shown; findings below may reference images not displayed]

FINDINGS: The weighted feeding tube is identified in the left upper quadrant
of the abdomen. This is in the expected location of the body of
stomach. Right upper quadrant surgical clips are noted. There is
enteric contrast material opacifying the colon.
IMPRESSION: 1. Tip of the feeding tube is in the expected location of the body
of stomach.

## 2016-06-15 IMAGING — CR DG CHEST 1V PORT
1 series · 1 of 1 positions shown · non-contrast
Comparison: Single view of the chest 03/16/2014 and 03/15/2014.

CLINICAL DATA: Hypoxia.

EXAM:
PORTABLE CHEST - 1 VIEW

[AP]
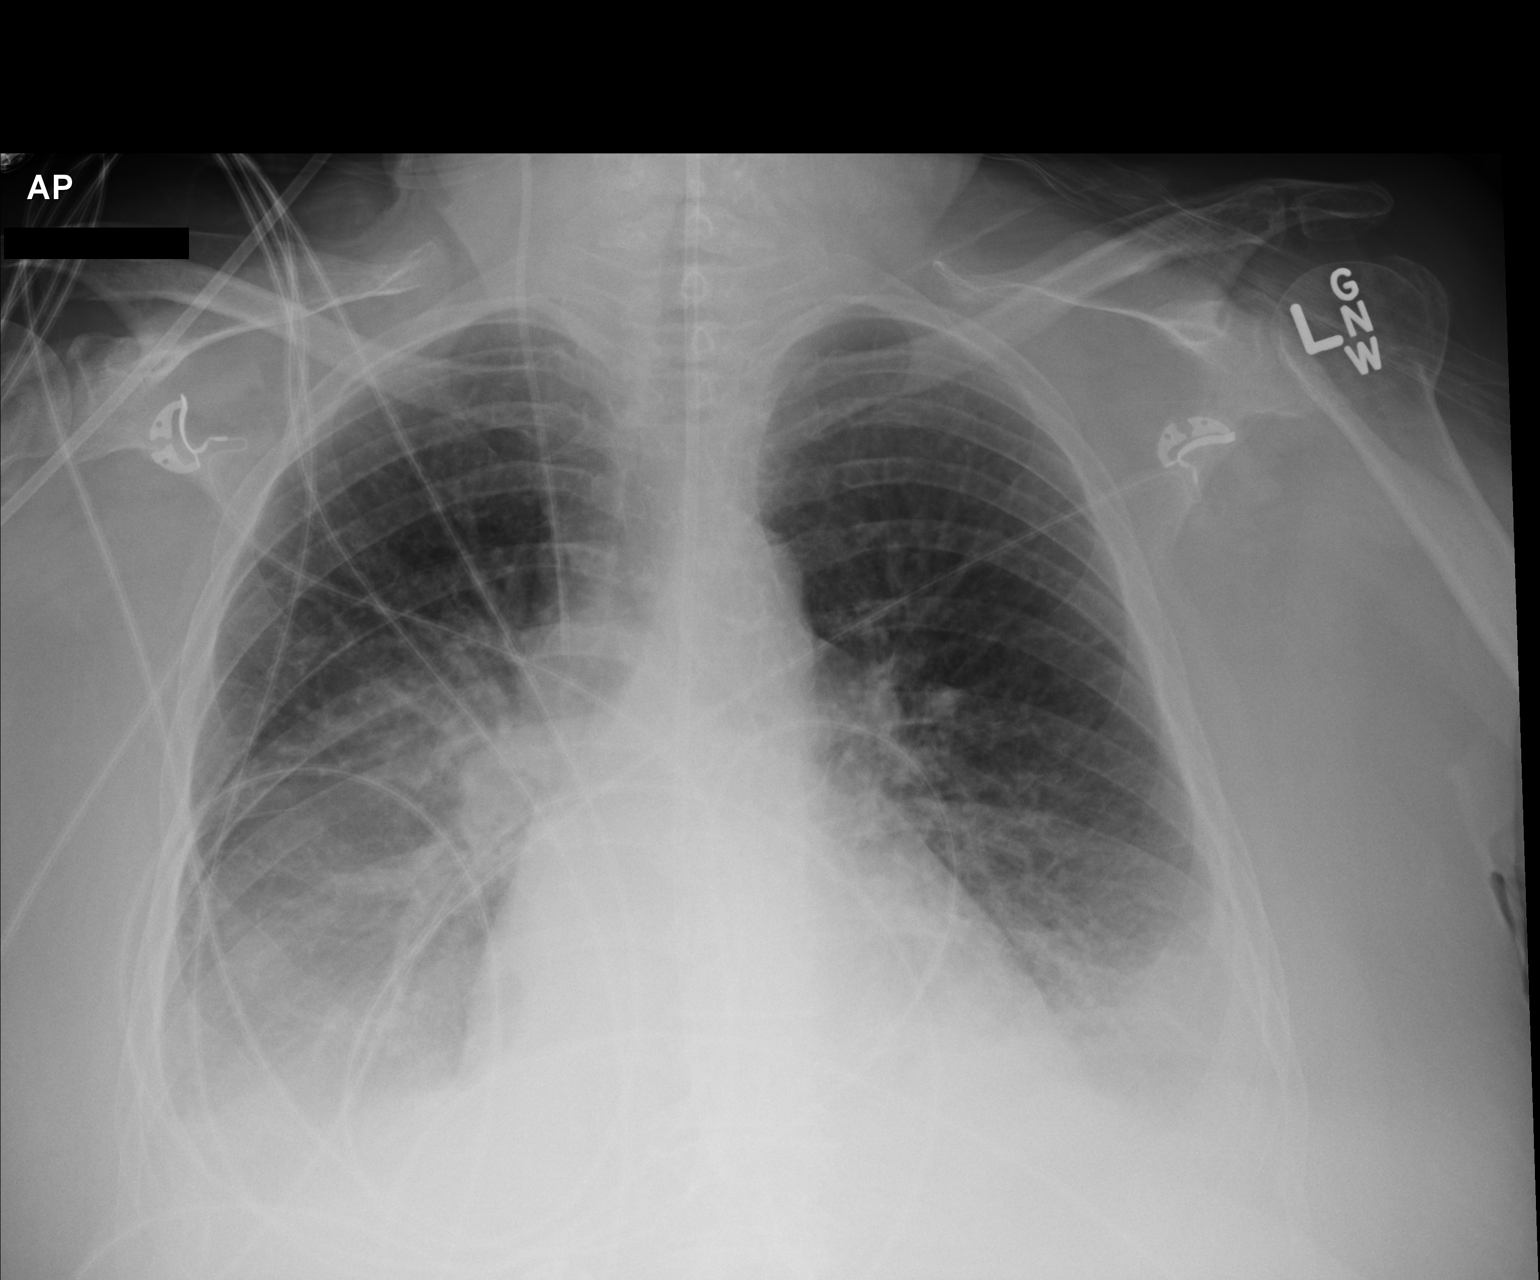

[1 of 1 positions shown; findings below may reference images not displayed]

FINDINGS: Right IJ catheter and feeding tube remain in place. Right greater
than left pleural effusions and basilar atelectasis persist without
marked change since yesterday's examination. Heart size is normal.
No pneumothorax.
IMPRESSION: No marked change in right greater than left effusions and airspace
disease since yesterday's examination.
